# Patient Record
Sex: Male | Born: 1940 | Race: White | Hispanic: No | Marital: Married | Smoking: Former smoker
Health system: Southern US, Community
[De-identification: ages and names within clinical notes are randomized; demographics above are authoritative.]

## PROBLEM LIST (undated history)

## (undated) DIAGNOSIS — K5792 Diverticulitis of intestine, part unspecified, without perforation or abscess without bleeding: Secondary | ICD-10-CM

## (undated) DIAGNOSIS — R002 Palpitations: Secondary | ICD-10-CM

## (undated) DIAGNOSIS — Z87442 Personal history of urinary calculi: Secondary | ICD-10-CM

## (undated) DIAGNOSIS — D689 Coagulation defect, unspecified: Secondary | ICD-10-CM

## (undated) DIAGNOSIS — K5790 Diverticulosis of intestine, part unspecified, without perforation or abscess without bleeding: Secondary | ICD-10-CM

## (undated) DIAGNOSIS — R7989 Other specified abnormal findings of blood chemistry: Secondary | ICD-10-CM

## (undated) DIAGNOSIS — K219 Gastro-esophageal reflux disease without esophagitis: Secondary | ICD-10-CM

## (undated) DIAGNOSIS — N289 Disorder of kidney and ureter, unspecified: Secondary | ICD-10-CM

## (undated) DIAGNOSIS — I272 Pulmonary hypertension, unspecified: Secondary | ICD-10-CM

## (undated) DIAGNOSIS — I472 Ventricular tachycardia: Secondary | ICD-10-CM

## (undated) DIAGNOSIS — K635 Polyp of colon: Secondary | ICD-10-CM

## (undated) DIAGNOSIS — G4733 Obstructive sleep apnea (adult) (pediatric): Secondary | ICD-10-CM

## (undated) DIAGNOSIS — M1990 Unspecified osteoarthritis, unspecified site: Secondary | ICD-10-CM

## (undated) DIAGNOSIS — G4730 Sleep apnea, unspecified: Secondary | ICD-10-CM

## (undated) DIAGNOSIS — I480 Paroxysmal atrial fibrillation: Secondary | ICD-10-CM

## (undated) DIAGNOSIS — E785 Hyperlipidemia, unspecified: Secondary | ICD-10-CM

## (undated) DIAGNOSIS — R740 Nonspecific elevation of levels of transaminase and lactic acid dehydrogenase [LDH]: Secondary | ICD-10-CM

## (undated) DIAGNOSIS — I10 Essential (primary) hypertension: Secondary | ICD-10-CM

## (undated) DIAGNOSIS — E669 Obesity, unspecified: Secondary | ICD-10-CM

## (undated) DIAGNOSIS — D696 Thrombocytopenia, unspecified: Secondary | ICD-10-CM

## (undated) DIAGNOSIS — I499 Cardiac arrhythmia, unspecified: Secondary | ICD-10-CM

## (undated) DIAGNOSIS — E119 Type 2 diabetes mellitus without complications: Secondary | ICD-10-CM

## (undated) DIAGNOSIS — K760 Fatty (change of) liver, not elsewhere classified: Secondary | ICD-10-CM

## (undated) DIAGNOSIS — E210 Primary hyperparathyroidism: Secondary | ICD-10-CM

## (undated) DIAGNOSIS — H269 Unspecified cataract: Secondary | ICD-10-CM

## (undated) DIAGNOSIS — N200 Calculus of kidney: Secondary | ICD-10-CM

## (undated) DIAGNOSIS — Z0389 Encounter for observation for other suspected diseases and conditions ruled out: Principal | ICD-10-CM

## (undated) DIAGNOSIS — C4490 Unspecified malignant neoplasm of skin, unspecified: Secondary | ICD-10-CM

## (undated) DIAGNOSIS — R0602 Shortness of breath: Secondary | ICD-10-CM

## (undated) DIAGNOSIS — T7840XA Allergy, unspecified, initial encounter: Secondary | ICD-10-CM

## (undated) HISTORY — DX: Hyperlipidemia, unspecified: E78.5

## (undated) HISTORY — DX: Essential (primary) hypertension: I10

## (undated) HISTORY — DX: Gastro-esophageal reflux disease without esophagitis: K21.9

## (undated) HISTORY — DX: Ventricular tachycardia: I47.2

## (undated) HISTORY — PX: ROTATOR CUFF REPAIR: SHX139

## (undated) HISTORY — DX: Unspecified cataract: H26.9

## (undated) HISTORY — DX: Coagulation defect, unspecified: D68.9

## (undated) HISTORY — DX: Diverticulosis of intestine, part unspecified, without perforation or abscess without bleeding: K57.90

## (undated) HISTORY — DX: Thrombocytopenia, unspecified: D69.6

## (undated) HISTORY — DX: Unspecified malignant neoplasm of skin, unspecified: C44.90

## (undated) HISTORY — DX: Fatty (change of) liver, not elsewhere classified: K76.0

## (undated) HISTORY — DX: Other specified abnormal findings of blood chemistry: R79.89

## (undated) HISTORY — DX: Pulmonary hypertension, unspecified: I27.20

## (undated) HISTORY — DX: Disorder of kidney and ureter, unspecified: N28.9

## (undated) HISTORY — DX: Unspecified osteoarthritis, unspecified site: M19.90

## (undated) HISTORY — DX: Allergy, unspecified, initial encounter: T78.40XA

## (undated) HISTORY — DX: Sleep apnea, unspecified: G47.30

## (undated) HISTORY — DX: Type 2 diabetes mellitus without complications: E11.9

## (undated) HISTORY — DX: Palpitations: R00.2

## (undated) HISTORY — DX: Encounter for observation for other suspected diseases and conditions ruled out: Z03.89

## (undated) HISTORY — DX: Polyp of colon: K63.5

## (undated) HISTORY — DX: Paroxysmal atrial fibrillation: I48.0

## (undated) HISTORY — PX: CHOLECYSTECTOMY: SHX55

## (undated) HISTORY — DX: Shortness of breath: R06.02

## (undated) HISTORY — DX: Cardiac arrhythmia, unspecified: I49.9

## (undated) HISTORY — DX: Calculus of kidney: N20.0

## (undated) HISTORY — PX: EYE SURGERY: SHX253

## (undated) HISTORY — DX: Obesity, unspecified: E66.9

## (undated) HISTORY — DX: Nonspecific elevation of levels of transaminase and lactic acid dehydrogenase (ldh): R74.0

---

## 1998-06-06 ENCOUNTER — Encounter: Payer: Self-pay | Admitting: Internal Medicine

## 1998-06-06 LAB — CONVERTED CEMR LAB: PSA: 0.7 ng/mL

## 2003-03-07 ENCOUNTER — Ambulatory Visit (HOSPITAL_BASED_OUTPATIENT_CLINIC_OR_DEPARTMENT_OTHER): Admission: RE | Admit: 2003-03-07 | Discharge: 2003-03-07 | Payer: Self-pay | Admitting: Internal Medicine

## 2003-03-07 ENCOUNTER — Encounter: Payer: Self-pay | Admitting: Internal Medicine

## 2004-06-09 ENCOUNTER — Ambulatory Visit: Payer: Self-pay | Admitting: Internal Medicine

## 2004-06-16 ENCOUNTER — Ambulatory Visit: Payer: Self-pay | Admitting: Internal Medicine

## 2004-12-14 ENCOUNTER — Ambulatory Visit: Payer: Self-pay | Admitting: Internal Medicine

## 2004-12-21 ENCOUNTER — Ambulatory Visit: Payer: Self-pay | Admitting: Internal Medicine

## 2005-06-13 ENCOUNTER — Ambulatory Visit: Payer: Self-pay | Admitting: Internal Medicine

## 2005-06-22 ENCOUNTER — Ambulatory Visit: Payer: Self-pay | Admitting: Internal Medicine

## 2005-07-04 ENCOUNTER — Ambulatory Visit: Payer: Self-pay | Admitting: Internal Medicine

## 2005-07-04 ENCOUNTER — Ambulatory Visit: Payer: Self-pay | Admitting: Gastroenterology

## 2005-07-18 ENCOUNTER — Encounter: Payer: Self-pay | Admitting: Internal Medicine

## 2005-07-18 ENCOUNTER — Ambulatory Visit: Payer: Self-pay | Admitting: Gastroenterology

## 2005-07-18 ENCOUNTER — Encounter (INDEPENDENT_AMBULATORY_CARE_PROVIDER_SITE_OTHER): Payer: Self-pay | Admitting: *Deleted

## 2005-07-26 ENCOUNTER — Encounter: Payer: Self-pay | Admitting: Internal Medicine

## 2005-08-12 ENCOUNTER — Ambulatory Visit: Payer: Self-pay | Admitting: Internal Medicine

## 2005-10-28 ENCOUNTER — Ambulatory Visit: Payer: Self-pay | Admitting: Internal Medicine

## 2005-11-07 ENCOUNTER — Ambulatory Visit: Payer: Self-pay | Admitting: Internal Medicine

## 2006-03-09 ENCOUNTER — Ambulatory Visit: Payer: Self-pay | Admitting: Internal Medicine

## 2006-04-03 ENCOUNTER — Ambulatory Visit: Payer: Self-pay | Admitting: Internal Medicine

## 2006-07-31 ENCOUNTER — Ambulatory Visit: Payer: Self-pay | Admitting: Internal Medicine

## 2006-07-31 LAB — CONVERTED CEMR LAB
ALT: 52 units/L — ABNORMAL HIGH (ref 0–40)
AST: 29 units/L (ref 0–37)
Albumin: 4 g/dL (ref 3.5–5.2)
Alkaline Phosphatase: 40 units/L (ref 39–117)
BUN: 19 mg/dL (ref 6–23)
Bilirubin, Direct: 0.1 mg/dL (ref 0.0–0.3)
CO2: 34 meq/L — ABNORMAL HIGH (ref 19–32)
Calcium: 9.4 mg/dL (ref 8.4–10.5)
Chloride: 107 meq/L (ref 96–112)
Cholesterol: 170 mg/dL (ref 0–200)
Creatinine, Ser: 1 mg/dL (ref 0.4–1.5)
GFR calc Af Amer: 97 mL/min
GFR calc non Af Amer: 80 mL/min
Glucose, Bld: 115 mg/dL — ABNORMAL HIGH (ref 70–99)
HDL: 31.2 mg/dL — ABNORMAL LOW (ref >39.0)
Hgb A1c MFr Bld: 6.2 % — ABNORMAL HIGH (ref 4.6–6.0)
LDL Cholesterol: 99 mg/dL (ref 0–99)
Potassium: 4.5 meq/L (ref 3.5–5.1)
Sodium: 147 meq/L — ABNORMAL HIGH (ref 135–145)
Total Bilirubin: 1.1 mg/dL (ref 0.3–1.2)
Total CHOL/HDL Ratio: 5.4
Total Protein: 6.9 g/dL (ref 6.0–8.3)
Triglycerides: 197 mg/dL — ABNORMAL HIGH (ref 0–149)
VLDL: 39 mg/dL (ref 0–40)

## 2006-08-07 ENCOUNTER — Ambulatory Visit: Payer: Self-pay | Admitting: Internal Medicine

## 2006-09-18 ENCOUNTER — Ambulatory Visit: Payer: Self-pay | Admitting: Internal Medicine

## 2006-09-25 ENCOUNTER — Ambulatory Visit: Payer: Self-pay | Admitting: Internal Medicine

## 2006-09-28 ENCOUNTER — Ambulatory Visit: Payer: Self-pay | Admitting: Internal Medicine

## 2006-09-29 LAB — CONVERTED CEMR LAB
BUN: 23 mg/dL (ref 6–23)
Creatinine, Ser: 1.3 mg/dL (ref 0.4–1.5)

## 2006-10-04 ENCOUNTER — Encounter: Admission: RE | Admit: 2006-10-04 | Discharge: 2006-10-04 | Payer: Self-pay | Admitting: Internal Medicine

## 2006-12-05 ENCOUNTER — Ambulatory Visit: Payer: Self-pay | Admitting: Internal Medicine

## 2006-12-05 LAB — CONVERTED CEMR LAB
ALT: 57 units/L — ABNORMAL HIGH (ref 0–53)
AST: 31 units/L (ref 0–37)
Albumin: 3.9 g/dL (ref 3.5–5.2)
Alkaline Phosphatase: 38 units/L — ABNORMAL LOW (ref 39–117)
BUN: 24 mg/dL — ABNORMAL HIGH (ref 6–23)
Bilirubin, Direct: 0.1 mg/dL (ref 0.0–0.3)
CO2: 31 meq/L (ref 19–32)
Calcium: 8.9 mg/dL (ref 8.4–10.5)
Chloride: 112 meq/L (ref 96–112)
Cholesterol: 166 mg/dL (ref 0–200)
Creatinine, Ser: 1.1 mg/dL (ref 0.4–1.5)
Direct LDL: 107.1 mg/dL
GFR calc Af Amer: 87 mL/min
GFR calc non Af Amer: 72 mL/min
Glucose, Bld: 102 mg/dL — ABNORMAL HIGH (ref 70–99)
HDL: 27.8 mg/dL — ABNORMAL LOW (ref >39.0)
Hgb A1c MFr Bld: 6.5 % — ABNORMAL HIGH (ref 4.6–6.0)
Potassium: 4 meq/L (ref 3.5–5.1)
Sodium: 144 meq/L (ref 135–145)
Total Bilirubin: 0.9 mg/dL (ref 0.3–1.2)
Total CHOL/HDL Ratio: 6
Total Protein: 6.6 g/dL (ref 6.0–8.3)
Triglycerides: 242 mg/dL (ref 0–149)
VLDL: 48 mg/dL — ABNORMAL HIGH (ref 0–40)

## 2006-12-07 ENCOUNTER — Encounter: Payer: Self-pay | Admitting: Internal Medicine

## 2006-12-07 DIAGNOSIS — R7402 Elevation of levels of lactic acid dehydrogenase (LDH): Secondary | ICD-10-CM | POA: Insufficient documentation

## 2006-12-07 DIAGNOSIS — I10 Essential (primary) hypertension: Secondary | ICD-10-CM | POA: Insufficient documentation

## 2006-12-07 DIAGNOSIS — R7989 Other specified abnormal findings of blood chemistry: Secondary | ICD-10-CM

## 2006-12-07 DIAGNOSIS — E785 Hyperlipidemia, unspecified: Secondary | ICD-10-CM | POA: Insufficient documentation

## 2006-12-07 DIAGNOSIS — D696 Thrombocytopenia, unspecified: Secondary | ICD-10-CM

## 2006-12-07 DIAGNOSIS — K219 Gastro-esophageal reflux disease without esophagitis: Secondary | ICD-10-CM | POA: Insufficient documentation

## 2006-12-07 DIAGNOSIS — R7401 Elevation of levels of liver transaminase levels: Secondary | ICD-10-CM | POA: Insufficient documentation

## 2006-12-07 DIAGNOSIS — R740 Nonspecific elevation of levels of transaminase and lactic acid dehydrogenase [LDH]: Secondary | ICD-10-CM

## 2006-12-07 HISTORY — DX: Hyperlipidemia, unspecified: E78.5

## 2006-12-07 HISTORY — DX: Essential (primary) hypertension: I10

## 2006-12-07 HISTORY — DX: Gastro-esophageal reflux disease without esophagitis: K21.9

## 2006-12-07 HISTORY — DX: Thrombocytopenia, unspecified: D69.6

## 2006-12-07 HISTORY — DX: Elevation of levels of liver transaminase levels: R74.01

## 2006-12-07 HISTORY — DX: Elevation of levels of lactic acid dehydrogenase (LDH): R74.02

## 2006-12-07 HISTORY — DX: Other specified abnormal findings of blood chemistry: R79.89

## 2006-12-12 ENCOUNTER — Ambulatory Visit: Payer: Self-pay | Admitting: Internal Medicine

## 2006-12-27 ENCOUNTER — Ambulatory Visit: Payer: Self-pay

## 2007-01-05 ENCOUNTER — Telehealth (INDEPENDENT_AMBULATORY_CARE_PROVIDER_SITE_OTHER): Payer: Self-pay | Admitting: *Deleted

## 2007-03-27 ENCOUNTER — Ambulatory Visit: Payer: Self-pay | Admitting: Internal Medicine

## 2007-03-27 DIAGNOSIS — I472 Ventricular tachycardia, unspecified: Secondary | ICD-10-CM

## 2007-03-27 DIAGNOSIS — I4729 Other ventricular tachycardia: Secondary | ICD-10-CM

## 2007-03-27 HISTORY — DX: Ventricular tachycardia, unspecified: I47.20

## 2007-03-27 HISTORY — DX: Other ventricular tachycardia: I47.29

## 2007-03-27 HISTORY — DX: Ventricular tachycardia: I47.2

## 2007-04-02 ENCOUNTER — Ambulatory Visit: Payer: Self-pay | Admitting: Cardiology

## 2007-04-03 ENCOUNTER — Ambulatory Visit: Payer: Self-pay | Admitting: Internal Medicine

## 2007-04-03 ENCOUNTER — Ambulatory Visit: Payer: Self-pay | Admitting: Cardiology

## 2007-04-03 LAB — CONVERTED CEMR LAB
ALT: 58 units/L — ABNORMAL HIGH (ref 0–53)
AST: 32 units/L (ref 0–37)
Albumin: 4.1 g/dL (ref 3.5–5.2)
Alkaline Phosphatase: 46 units/L (ref 39–117)
BUN: 20 mg/dL (ref 6–23)
Basophils Absolute: 0 10*3/uL (ref 0.0–0.1)
Basophils Relative: 0.4 % (ref 0.0–1.0)
Bilirubin Urine: NEGATIVE
Bilirubin, Direct: 0.2 mg/dL (ref 0.0–0.3)
CO2: 34 meq/L — ABNORMAL HIGH (ref 19–32)
Calcium: 9.9 mg/dL (ref 8.4–10.5)
Chloride: 110 meq/L (ref 96–112)
Cholesterol: 162 mg/dL (ref 0–200)
Creatinine, Ser: 1 mg/dL (ref 0.4–1.5)
Eosinophils Absolute: 0.1 10*3/uL (ref 0.0–0.6)
Eosinophils Relative: 1.3 % (ref 0.0–5.0)
GFR calc Af Amer: 97 mL/min
GFR calc non Af Amer: 80 mL/min
Glucose, Bld: 122 mg/dL — ABNORMAL HIGH (ref 70–99)
HCT: 42.7 % (ref 39.0–52.0)
HDL: 28 mg/dL — ABNORMAL LOW (ref >39.0)
Hemoglobin, Urine: NEGATIVE
Hemoglobin: 14.6 g/dL (ref 13.0–17.0)
INR: 1 (ref 0.8–1.0)
Ketones, ur: NEGATIVE mg/dL
LDL Cholesterol: 96 mg/dL (ref 0–99)
Leukocytes, UA: NEGATIVE
Lymphocytes Relative: 22.6 % (ref 12.0–46.0)
MCHC: 34.3 g/dL (ref 30.0–36.0)
MCV: 91.7 fL (ref 78.0–100.0)
Monocytes Absolute: 0.5 10*3/uL (ref 0.2–0.7)
Monocytes Relative: 9.3 % (ref 3.0–11.0)
Neutro Abs: 3.2 10*3/uL (ref 1.4–7.7)
Neutrophils Relative %: 66.4 % (ref 43.0–77.0)
Nitrite: NEGATIVE
PSA: 1.04 ng/mL (ref 0.10–4.00)
Platelets: 128 10*3/uL — ABNORMAL LOW (ref 150–400)
Potassium: 4.9 meq/L (ref 3.5–5.1)
Prothrombin Time: 11.8 s (ref 10.9–13.3)
RBC: 4.65 M/uL (ref 4.22–5.81)
RDW: 12.3 % (ref 11.5–14.6)
Sodium: 149 meq/L — ABNORMAL HIGH (ref 135–145)
Specific Gravity, Urine: 1.025 (ref 1.000–1.03)
TSH: 1.75 microintl units/mL (ref 0.35–5.50)
Total Bilirubin: 1.1 mg/dL (ref 0.3–1.2)
Total CHOL/HDL Ratio: 5.8
Total Protein, Urine: NEGATIVE mg/dL
Total Protein: 7 g/dL (ref 6.0–8.3)
Triglycerides: 190 mg/dL — ABNORMAL HIGH (ref 0–149)
Urine Glucose: NEGATIVE mg/dL
Urobilinogen, UA: 0.2 (ref 0.0–1.0)
VLDL: 38 mg/dL (ref 0–40)
WBC: 4.9 10*3/uL (ref 4.5–10.5)
aPTT: 26.3 s (ref 21.7–29.8)
pH: 6 (ref 5.0–8.0)

## 2007-04-06 ENCOUNTER — Ambulatory Visit: Payer: Self-pay | Admitting: Cardiology

## 2007-04-06 ENCOUNTER — Inpatient Hospital Stay (HOSPITAL_BASED_OUTPATIENT_CLINIC_OR_DEPARTMENT_OTHER): Admission: RE | Admit: 2007-04-06 | Discharge: 2007-04-06 | Payer: Self-pay | Admitting: Cardiology

## 2007-04-06 DIAGNOSIS — IMO0001 Reserved for inherently not codable concepts without codable children: Secondary | ICD-10-CM | POA: Insufficient documentation

## 2007-04-06 HISTORY — PX: CARDIAC CATHETERIZATION: SHX172

## 2007-04-06 HISTORY — DX: Reserved for inherently not codable concepts without codable children: IMO0001

## 2007-04-24 ENCOUNTER — Ambulatory Visit: Payer: Self-pay | Admitting: Cardiology

## 2007-04-27 ENCOUNTER — Ambulatory Visit: Payer: Self-pay | Admitting: Internal Medicine

## 2007-07-24 ENCOUNTER — Telehealth: Payer: Self-pay | Admitting: Internal Medicine

## 2007-07-26 ENCOUNTER — Ambulatory Visit: Payer: Self-pay | Admitting: Cardiology

## 2007-09-25 ENCOUNTER — Telehealth: Payer: Self-pay | Admitting: Internal Medicine

## 2007-10-22 ENCOUNTER — Encounter: Payer: Self-pay | Admitting: Internal Medicine

## 2007-10-22 DIAGNOSIS — G4733 Obstructive sleep apnea (adult) (pediatric): Secondary | ICD-10-CM

## 2007-10-22 DIAGNOSIS — G4730 Sleep apnea, unspecified: Secondary | ICD-10-CM | POA: Insufficient documentation

## 2007-10-22 HISTORY — DX: Obstructive sleep apnea (adult) (pediatric): G47.33

## 2007-10-26 ENCOUNTER — Ambulatory Visit: Payer: Self-pay | Admitting: Internal Medicine

## 2007-10-30 ENCOUNTER — Telehealth (INDEPENDENT_AMBULATORY_CARE_PROVIDER_SITE_OTHER): Payer: Self-pay | Admitting: *Deleted

## 2008-01-01 ENCOUNTER — Encounter: Payer: Self-pay | Admitting: Internal Medicine

## 2008-01-11 ENCOUNTER — Ambulatory Visit: Payer: Self-pay | Admitting: Internal Medicine

## 2008-04-09 ENCOUNTER — Encounter: Payer: Self-pay | Admitting: Internal Medicine

## 2008-06-16 ENCOUNTER — Ambulatory Visit: Payer: Self-pay | Admitting: Internal Medicine

## 2008-06-18 ENCOUNTER — Telehealth: Payer: Self-pay | Admitting: Internal Medicine

## 2008-06-18 LAB — CONVERTED CEMR LAB
ALT: 75 units/L — ABNORMAL HIGH (ref 0–53)
AST: 43 units/L — ABNORMAL HIGH (ref 0–37)
Albumin: 4.1 g/dL (ref 3.5–5.2)
Alkaline Phosphatase: 43 units/L (ref 39–117)
BUN: 22 mg/dL (ref 6–23)
Basophils Absolute: 0 10*3/uL (ref 0.0–0.1)
Basophils Relative: 0.3 % (ref 0.0–3.0)
Bilirubin, Direct: 0.1 mg/dL (ref 0.0–0.3)
CO2: 30 meq/L (ref 19–32)
Calcium: 9.7 mg/dL (ref 8.4–10.5)
Chloride: 102 meq/L (ref 96–112)
Cholesterol: 151 mg/dL (ref 0–200)
Creatinine, Ser: 1.1 mg/dL (ref 0.4–1.5)
Eosinophils Absolute: 0.1 10*3/uL (ref 0.0–0.7)
Eosinophils Relative: 1.2 % (ref 0.0–5.0)
GFR calc Af Amer: 86 mL/min
GFR calc non Af Amer: 71 mL/min
Glucose, Bld: 127 mg/dL — ABNORMAL HIGH (ref 70–99)
HCT: 41.2 % (ref 39.0–52.0)
HDL: 24.5 mg/dL — ABNORMAL LOW (ref >39.0)
Hemoglobin: 14 g/dL (ref 13.0–17.0)
Hgb A1c MFr Bld: 6.7 % — ABNORMAL HIGH (ref 4.6–6.0)
LDL Cholesterol: 93 mg/dL (ref 0–99)
Lymphocytes Relative: 18.5 % (ref 12.0–46.0)
MCHC: 34.1 g/dL (ref 30.0–36.0)
MCV: 91.7 fL (ref 78.0–100.0)
Monocytes Absolute: 0.5 10*3/uL (ref 0.1–1.0)
Monocytes Relative: 9 % (ref 3.0–12.0)
Neutro Abs: 4.1 10*3/uL (ref 1.4–7.7)
Neutrophils Relative %: 71 % (ref 43.0–77.0)
PSA: 4.02 ng/mL — ABNORMAL HIGH (ref 0.10–4.00)
Platelets: 128 10*3/uL — ABNORMAL LOW (ref 150–400)
Potassium: 4.6 meq/L (ref 3.5–5.1)
RBC: 4.49 M/uL (ref 4.22–5.81)
RDW: 12.8 % (ref 11.5–14.6)
Sodium: 140 meq/L (ref 135–145)
TSH: 1.58 microintl units/mL (ref 0.35–5.50)
Total Bilirubin: 0.9 mg/dL (ref 0.3–1.2)
Total CHOL/HDL Ratio: 6.2
Total Protein: 7.3 g/dL (ref 6.0–8.3)
Triglycerides: 169 mg/dL — ABNORMAL HIGH (ref 0–149)
VLDL: 34 mg/dL (ref 0–40)
WBC: 5.8 10*3/uL (ref 4.5–10.5)

## 2008-06-20 ENCOUNTER — Encounter: Payer: Self-pay | Admitting: Internal Medicine

## 2008-06-24 ENCOUNTER — Encounter: Payer: Self-pay | Admitting: Internal Medicine

## 2008-07-03 ENCOUNTER — Ambulatory Visit: Payer: Self-pay | Admitting: Internal Medicine

## 2008-07-04 ENCOUNTER — Encounter: Payer: Self-pay | Admitting: Internal Medicine

## 2008-07-04 LAB — CONVERTED CEMR LAB
PSA, Free Pct: 25 (ref >25)
PSA, Free: 0.4 ng/mL
PSA: 1.59 ng/mL (ref 0.10–4.00)

## 2008-08-19 ENCOUNTER — Encounter: Payer: Self-pay | Admitting: Internal Medicine

## 2008-08-27 ENCOUNTER — Encounter: Payer: Self-pay | Admitting: Internal Medicine

## 2008-09-01 ENCOUNTER — Encounter: Admission: RE | Admit: 2008-09-01 | Discharge: 2008-09-01 | Payer: Self-pay | Admitting: Gastroenterology

## 2008-09-04 ENCOUNTER — Encounter: Payer: Self-pay | Admitting: Internal Medicine

## 2008-09-04 LAB — HM COLONOSCOPY

## 2008-09-23 ENCOUNTER — Encounter: Admission: RE | Admit: 2008-09-23 | Discharge: 2008-09-23 | Payer: Self-pay | Admitting: Gastroenterology

## 2008-09-23 ENCOUNTER — Encounter: Payer: Self-pay | Admitting: Internal Medicine

## 2008-09-23 ENCOUNTER — Inpatient Hospital Stay (HOSPITAL_COMMUNITY): Admission: EM | Admit: 2008-09-23 | Discharge: 2008-09-26 | Payer: Self-pay | Admitting: Emergency Medicine

## 2008-09-23 ENCOUNTER — Encounter: Payer: Self-pay | Admitting: Gastroenterology

## 2008-09-24 ENCOUNTER — Encounter (INDEPENDENT_AMBULATORY_CARE_PROVIDER_SITE_OTHER): Payer: Self-pay | Admitting: Surgery

## 2008-10-01 ENCOUNTER — Ambulatory Visit: Payer: Self-pay | Admitting: Internal Medicine

## 2008-11-11 ENCOUNTER — Encounter: Payer: Self-pay | Admitting: Internal Medicine

## 2009-07-06 ENCOUNTER — Ambulatory Visit: Payer: Self-pay | Admitting: Internal Medicine

## 2009-07-06 LAB — CONVERTED CEMR LAB

## 2009-07-07 LAB — CONVERTED CEMR LAB
ALT: 49 units/L (ref 0–53)
AST: 32 units/L (ref 0–37)
Albumin: 3.9 g/dL (ref 3.5–5.2)
Alkaline Phosphatase: 36 units/L — ABNORMAL LOW (ref 39–117)
BUN: 18 mg/dL (ref 6–23)
Basophils Absolute: 0 10*3/uL (ref 0.0–0.1)
Basophils Relative: 0.6 % (ref 0.0–3.0)
Bilirubin, Direct: 0.1 mg/dL (ref 0.0–0.3)
CO2: 30 meq/L (ref 19–32)
Calcium: 9.1 mg/dL (ref 8.4–10.5)
Chloride: 105 meq/L (ref 96–112)
Cholesterol: 144 mg/dL (ref 0–200)
Creatinine, Ser: 1.2 mg/dL (ref 0.4–1.5)
Direct LDL: 90.4 mg/dL
Eosinophils Absolute: 0.1 10*3/uL (ref 0.0–0.7)
Eosinophils Relative: 1.8 % (ref 0.0–5.0)
GFR calc non Af Amer: 64.23 mL/min (ref >60)
Glucose, Bld: 116 mg/dL — ABNORMAL HIGH (ref 70–99)
HCT: 40.2 % (ref 39.0–52.0)
HDL: 28.5 mg/dL — ABNORMAL LOW (ref >39.00)
Hemoglobin: 13 g/dL (ref 13.0–17.0)
Hgb A1c MFr Bld: 6.5 % (ref 4.6–6.5)
Lymphocytes Relative: 19 % (ref 12.0–46.0)
Lymphs Abs: 0.9 10*3/uL (ref 0.7–4.0)
MCHC: 32.4 g/dL (ref 30.0–36.0)
MCV: 93.1 fL (ref 78.0–100.0)
Monocytes Absolute: 0.4 10*3/uL (ref 0.1–1.0)
Monocytes Relative: 8.3 % (ref 3.0–12.0)
Neutro Abs: 3.2 10*3/uL (ref 1.4–7.7)
Neutrophils Relative %: 70.3 % (ref 43.0–77.0)
PSA: 1.19 ng/mL (ref 0.10–4.00)
Platelets: 113 10*3/uL — ABNORMAL LOW (ref 150.0–400.0)
Potassium: 4.4 meq/L (ref 3.5–5.1)
RBC: 4.31 M/uL (ref 4.22–5.81)
RDW: 12.9 % (ref 11.5–14.6)
Sodium: 140 meq/L (ref 135–145)
Total Bilirubin: 0.7 mg/dL (ref 0.3–1.2)
Total CHOL/HDL Ratio: 5
Total Protein: 7.1 g/dL (ref 6.0–8.3)
Triglycerides: 207 mg/dL — ABNORMAL HIGH (ref 0.0–149.0)
VLDL: 41.4 mg/dL — ABNORMAL HIGH (ref 0.0–40.0)
WBC: 4.6 10*3/uL (ref 4.5–10.5)

## 2009-07-10 ENCOUNTER — Encounter: Payer: Self-pay | Admitting: Internal Medicine

## 2009-11-05 ENCOUNTER — Ambulatory Visit: Payer: Self-pay | Admitting: Internal Medicine

## 2009-11-05 LAB — CONVERTED CEMR LAB
ALT: 27 units/L (ref 0–53)
AST: 23 units/L (ref 0–37)
Albumin: 4.4 g/dL (ref 3.5–5.2)
Alkaline Phosphatase: 36 units/L — ABNORMAL LOW (ref 39–117)
BUN: 42 mg/dL — ABNORMAL HIGH (ref 6–23)
Bilirubin, Direct: 0.1 mg/dL (ref 0.0–0.3)
CO2: 30 meq/L (ref 19–32)
Calcium: 9.2 mg/dL (ref 8.4–10.5)
Chloride: 107 meq/L (ref 96–112)
Cholesterol: 156 mg/dL (ref 0–200)
Creatinine, Ser: 1.5 mg/dL (ref 0.4–1.5)
GFR calc non Af Amer: 49.6 mL/min (ref >60)
Glucose, Bld: 109 mg/dL — ABNORMAL HIGH (ref 70–99)
HDL: 32.8 mg/dL — ABNORMAL LOW (ref >39.00)
Hgb A1c MFr Bld: 6.2 % (ref 4.6–6.5)
LDL Cholesterol: 92 mg/dL (ref 0–99)
Potassium: 4.6 meq/L (ref 3.5–5.1)
Sodium: 144 meq/L (ref 135–145)
Total Bilirubin: 1.1 mg/dL (ref 0.3–1.2)
Total CHOL/HDL Ratio: 5
Total Protein: 7.3 g/dL (ref 6.0–8.3)
Triglycerides: 155 mg/dL — ABNORMAL HIGH (ref 0.0–149.0)
VLDL: 31 mg/dL (ref 0.0–40.0)

## 2009-11-10 ENCOUNTER — Ambulatory Visit: Payer: Self-pay | Admitting: Internal Medicine

## 2009-11-10 DIAGNOSIS — E669 Obesity, unspecified: Secondary | ICD-10-CM

## 2009-11-10 HISTORY — DX: Obesity, unspecified: E66.9

## 2009-12-22 ENCOUNTER — Telehealth (INDEPENDENT_AMBULATORY_CARE_PROVIDER_SITE_OTHER): Payer: Self-pay | Admitting: *Deleted

## 2010-02-02 ENCOUNTER — Ambulatory Visit: Payer: Self-pay | Admitting: Internal Medicine

## 2010-02-02 LAB — CONVERTED CEMR LAB
ALT: 30 units/L (ref 0–53)
AST: 19 units/L (ref 0–37)
Albumin: 4.1 g/dL (ref 3.5–5.2)
Alkaline Phosphatase: 35 units/L — ABNORMAL LOW (ref 39–117)
BUN: 37 mg/dL — ABNORMAL HIGH (ref 6–23)
Bilirubin, Direct: 0.1 mg/dL (ref 0.0–0.3)
CO2: 27 meq/L (ref 19–32)
Calcium: 9.5 mg/dL (ref 8.4–10.5)
Chloride: 105 meq/L (ref 96–112)
Cholesterol: 164 mg/dL (ref 0–200)
Creatinine, Ser: 1.6 mg/dL — ABNORMAL HIGH (ref 0.4–1.5)
GFR calc non Af Amer: 46 mL/min (ref >60)
Glucose, Bld: 106 mg/dL — ABNORMAL HIGH (ref 70–99)
HDL: 34.3 mg/dL — ABNORMAL LOW (ref >39.00)
Hgb A1c MFr Bld: 6.2 % (ref 4.6–6.5)
LDL Cholesterol: 91 mg/dL (ref 0–99)
Potassium: 5.1 meq/L (ref 3.5–5.1)
Sodium: 141 meq/L (ref 135–145)
Total Bilirubin: 0.7 mg/dL (ref 0.3–1.2)
Total CHOL/HDL Ratio: 5
Total Protein: 6.5 g/dL (ref 6.0–8.3)
Triglycerides: 195 mg/dL — ABNORMAL HIGH (ref 0.0–149.0)
VLDL: 39 mg/dL (ref 0.0–40.0)

## 2010-02-05 ENCOUNTER — Ambulatory Visit: Payer: Self-pay | Admitting: Internal Medicine

## 2010-06-10 ENCOUNTER — Ambulatory Visit
Admission: RE | Admit: 2010-06-10 | Discharge: 2010-06-10 | Payer: Self-pay | Source: Home / Self Care | Attending: Internal Medicine | Admitting: Internal Medicine

## 2010-06-10 DIAGNOSIS — J069 Acute upper respiratory infection, unspecified: Secondary | ICD-10-CM | POA: Insufficient documentation

## 2010-06-17 ENCOUNTER — Encounter: Payer: Self-pay | Admitting: Internal Medicine

## 2010-07-08 NOTE — Assessment & Plan Note (Signed)
Summary: 4 month rov/et/pt rsc from bmp/cjr   Vital Signs:  Patient profile:   70 year old male Weight:      235 pounds BMI:     33.36 Pulse rate:   56 / minute Pulse rhythm:   regular Resp:     12 per minute BP sitting:   124 / 76  (left arm) Cuff size:   regular  Vitals Entered By: Rica Records, RN (November 10, 2009 1:24 PM) CC: 4 month rov, labs review Is Patient Diabetic? No   CC:  4 month rov and labs review.  History of Present Illness:  Follow-Up Visit      This is a 70 year old man who presents for Follow-up visit.  The patient denies chest pain and palpitations.  Since the last visit the patient notes no new problems or concerns.  The patient reports taking meds as prescribed, dietary compliance, and exercising occasionaly.  When questioned about possible medication side effects, the patient notes none.    All other systems reviewed and were negative   Preventive Screening-Counseling & Management  Alcohol-Tobacco     Smoking Status: never  Current Medications (verified): 1)  Aspirin Ec 325 Mg Tbec (Aspirin) .... Once Daily 2)  Lisinopril-Hydrochlorothiazide 20-12.5 Mg Tabs (Lisinopril-Hydrochlorothiazide) .... Take 2 Tablet By Mouth Once A Day 3)  Prilosec Otc 20 Mg Tbec (Omeprazole Magnesium) .... One By Mouth Day 4)  Viagra 100 Mg Tabs (Sildenafil Citrate) .... 1/2-1 By Mouth Once Daily As Needed. 5)  Fish Oil 1000 Mg  Caps (Omega-3 Fatty Acids) .... Once Daily 6)  Multivitamins   Tabs (Multiple Vitamin) .... Once Daily 7)  Vitamin D 1000 Unit Caps (Cholecalciferol) .... Once Daily 8)  Carvedilol 25 Mg Tabs (Carvedilol) .... One Bid 9)  Niaspan 500 Mg Cr-Tabs (Niacin (Antihyperlipidemic)) .... Take 1 Tablet By Mouth Once A Day  Allergies (verified): No Known Drug Allergies  Past History:  Past Medical History: Last updated: 12/07/2006 GERD Hyperlipidemia Hypertension thrombocytopenia increased LFT's 2002 Hep B & C negative increased glucose  Past  Surgical History: Last updated: 10/01/2008  Cholecystectomy  Family History: Last updated: 07/06/2009 father--kidney failure 11 yo mother CHF age 86 yo Family History Diabetes 1st degree relative---mother and father  Social History: Last updated: 03/27/2007 Married Regular exercise-no  Risk Factors: Exercise: no (03/27/2007)  Risk Factors: Smoking Status: never (11/10/2009)  Physical Exam  General:  Well-developed,well-nourished,in no acute distress; alert,appropriate and cooperative throughout examination Head:  normocephalic and atraumatic.   Eyes:  pupils equal and pupils round.   Ears:  R ear normal and L ear normal.   Neck:  No deformities, masses, or tenderness noted. Chest Wall:  no deformities and no tenderness.   Lungs:  Normal respiratory effort, chest expands symmetrically. Lungs are clear to auscultation, no crackles or wheezes. Heart:  Normal rate and regular rhythm. S1 and S2 normal without gallop, murmur, click, rub or other extra sounds. Abdomen:  Bowel sounds positive,abdomen soft and non-tender without masses, organomegaly or hernias noted.  overweight Msk:  No deformity or scoliosis noted of thoracic or lumbar spine.   Neurologic:  cranial nerves II-XII intact and gait normal.     Impression & Recommendations:  Problem # 1:  HYPERGLYCEMIA (ICD-790.6) improved weight loss helping  Problem # 2:  HYPERLIPIDEMIA (ICD-272.4) hdl improved His updated medication list for this problem includes:    Niaspan 500 Mg Cr-tabs (Niacin (antihyperlipidemic)) .Marland Kitchen... Take 1 tablet by mouth once a day  Problem #  3:  HYPERTENSION (ICD-401.9) controlled continue current medications  His updated medication list for this problem includes:    Lisinopril-hydrochlorothiazide 20-12.5 Mg Tabs (Lisinopril-hydrochlorothiazide) .Marland Kitchen... Take 2 tablet by mouth once a day    Carvedilol 25 Mg Tabs (Carvedilol) ..... One bid  BP today: 124/76 Prior BP: 134/78 (07/06/2009)  Labs  Reviewed: K+: 4.6 (11/05/2009) Creat: : 1.5 (11/05/2009)   Chol: 156 (11/05/2009)   HDL: 32.80 (11/05/2009)   LDL: 92 (11/05/2009)   TG: 155.0 (11/05/2009)  Problem # 4:  OBESITY (ICD-278.00)  he has lost weight: congratulated encouraged to continue low calorie diet needs to add exercise all of the above discussed with patient and wife  Ht: 70.5 (07/06/2009)   Wt: 235 (11/10/2009)   BMI: 33.36 (11/10/2009)  Complete Medication List: 1)  Aspirin Ec 325 Mg Tbec (Aspirin) .... Once daily 2)  Lisinopril-hydrochlorothiazide 20-12.5 Mg Tabs (Lisinopril-hydrochlorothiazide) .... Take 2 tablet by mouth once a day 3)  Prilosec Otc 20 Mg Tbec (Omeprazole magnesium) .... One by mouth day 4)  Viagra 100 Mg Tabs (Sildenafil citrate) .... 1/2-1 by mouth once daily as needed. 5)  Fish Oil 1000 Mg Caps (Omega-3 fatty acids) .... Once daily 6)  Multivitamins Tabs (Multiple vitamin) .... Once daily 7)  Vitamin D 1000 Unit Caps (Cholecalciferol) .... Once daily 8)  Carvedilol 25 Mg Tabs (Carvedilol) .... One bid 9)  Niaspan 500 Mg Cr-tabs (Niacin (antihyperlipidemic)) .... Take 1 tablet by mouth once a day  Patient Instructions: 1)  Please schedule a follow-up appointment in 3 months. 2)  labs one week prior to visit 3)  lipids---272.4 4)  lfts-995.2 5)  bmet-995.2 6)  A1C-250.02 7)

## 2010-07-08 NOTE — Progress Notes (Signed)
Summary: increase Metoprolol  Phone Note Call from Patient   Caller: Patient Call For: Dr. Leanne Chang Summary of Call: Martin Cordova would like to increase his Metoprolol due to heart pounding and palpitations. Initial call taken by: Deanna Artis CMA,  July 24, 2007 10:02 AM  Follow-up for Phone Call        he probably needs to see me, but will increase meds. See Rx Follow-up by: Phoebe Sharps MD,  July 24, 2007 10:07 AM  Additional Follow-up for Phone Call Additional follow up Details #1::        Pt. is only taking 25 mg. q day....Marland KitchenMarland KitchenDo you want him increase to 100 mg. 1/2 two times a day? [id number] Additional Follow-up by: Deanna Artis CMA,  July 24, 2007 10:35 AM    Additional Follow-up for Phone Call Additional follow up Details #2::    yes Follow-up by: Phoebe Sharps MD,  July 24, 2007 12:07 PM  New/Updated Medications: METOPROLOL TARTRATE 100 MG TABS (METOPROLOL TARTRATE) 1/2 by mouth two times a day   Prescriptions: METOPROLOL TARTRATE 100 MG TABS (METOPROLOL TARTRATE) 1/2 by mouth two times a day  #90 x 3   Entered and Authorized by:   Phoebe Sharps MD   Signed by:   Phoebe Sharps MD on 07/24/2007   Method used:   [facility] to ...       [facility]  [address] [id number]       [address]       [place], [place]  [address]       Ph: [phone] or [phone]       Fax: [phone]   RxID:   [id number]    Pt. notified.

## 2010-07-08 NOTE — Assessment & Plan Note (Signed)
Summary: m6a/fup/RCD   Vital Signs:  Patient Profile:   70 Years Old Male Height:     70.5 inches Weight:      243 pounds Temp:     98.4 degrees F Pulse rate:   76 / minute BP sitting:   136 / 68  (left arm)  Vitals Entered By: Rica Records, RN (Oct 26, 2007 3:30 PM)                 Chief Complaint:  6 month rov.  History of Present Illness:  Follow-Up Visit      This is a 70 year old man who presents for Follow-up visit.  The patient denies chest pain, palpitations, dizziness, syncope, low blood sugar symptoms, high blood sugar symptoms, edema, SOB, DOE, PND, and orthopnea.  Since the last visit the patient notes no new problems or concerns.  The patient reports taking meds as prescribed.  When questioned about possible medication side effects, the patient notes none.    Past Medical History: GERD Hyperlipidemia Hypertension thrombocytopenia increased LFT's 2002 Hep B & C negative increased glucose  Past Surgical History: none as of 2000 Social History: Married Regular exercise-no  Family History:  Current Meds:  ASPIR-81 81 MG TBEC (ASPIRIN) Take 1 tablet by mouth once a day LISINOPRIL-HYDROCHLOROTHIAZIDE 20-12.5 MG TABS (LISINOPRIL-HYDROCHLOROTHIAZIDE) Take 2 tablet by mouth once a day PRILOSEC OTC 20 MG TBEC (OMEPRAZOLE MAGNESIUM) one by mouth day METOPROLOL SUCCINATE 25 MG  TB24 (METOPROLOL SUCCINATE) take 1/2 two times a day and an additional 1/2 if needed VIAGRA 100 MG TABS (SILDENAFIL CITRATE) 1/2-1 by mouth once daily as needed. FISH OIL 1000 MG  CAPS (OMEGA-3 FATTY ACIDS) once daily      Current Allergies (reviewed today): No known allergies      Review of Systems       no other complaints in a complete ROS    Physical Exam  General:     Well-developed,well-nourished,in no acute distress; alert,appropriate and cooperative throughout examination Head:     normocephalic and atraumatic.   Eyes:     pupils equal and pupils round.   Ears:  R ear normal and L ear normal.   Neck:     No deformities, masses, or tenderness noted. Chest Wall:     no deformities and no tenderness.   Lungs:     Normal respiratory effort, chest expands symmetrically. Lungs are clear to auscultation, no crackles or wheezes. Heart:     Normal rate and regular rhythm. S1 and S2 normal without gallop, murmur, click, rub or other extra sounds. Abdomen:     Bowel sounds positive,abdomen soft and non-tender without masses, organomegaly or hernias noted.  overweight Msk:     No deformity or scoliosis noted of thoracic or lumbar spine.   Pulses:     R radial normal and L radial normal.   Extremities:     No clubbing, cyanosis, edema, or deformity noted  Neurologic:     cranial nerves II-XII intact and gait normal.      Impression & Recommendations:  Problem # 1:  HYPERTENSION (ICD-401.9) tolerating meds would be much better with weight loss---he understands ("it's just hard") His updated medication list for this problem includes:    Lisinopril-hydrochlorothiazide 20-12.5 Mg Tabs (Lisinopril-hydrochlorothiazide) .Marland Kitchen... Take 2 tablet by mouth once a day    Metoprolol Succinate 25 Mg Tb24 (Metoprolol succinate) .Marland Kitchen... Take 1/2 two times a day and an additional 1/2 if needed  BP today: 136/68 Prior  BP: 144/86 (04/27/2007)  Labs Reviewed: Creat: 1.0 (04/03/2007) Chol: 162 (04/03/2007)   HDL: 28.0 (04/03/2007)   LDL: 96 (04/03/2007)   TG: 190 (04/03/2007)   Problem # 2:  GERD (ICD-530.81) no sxs on meds His updated medication list for this problem includes:    Prilosec Otc 20 Mg Tbec (Omeprazole magnesium) ..... One by mouth day   Problem # 3:  HYPERLIPIDEMIA (ICD-272.4) diet, exercise and weight loss are the key Labs Reviewed: Chol: 162 (04/03/2007)   HDL: 28.0 (04/03/2007)   LDL: 96 (04/03/2007)   TG: 190 (04/03/2007) SGOT: 32 (04/03/2007)   SGPT: 58 (04/03/2007)   Problem # 4:  THROMBOCYTOPENIA (ICD-287.5)  Complete Medication  List: 1)  Aspir-81 81 Mg Tbec (Aspirin) .... Take 1 tablet by mouth once a day 2)  Lisinopril-hydrochlorothiazide 20-12.5 Mg Tabs (Lisinopril-hydrochlorothiazide) .... Take 2 tablet by mouth once a day 3)  Prilosec Otc 20 Mg Tbec (Omeprazole magnesium) .... One by mouth day 4)  Metoprolol Succinate 25 Mg Tb24 (Metoprolol succinate) .... Take 1/2 two times a day and an additional 1/2 if needed 5)  Viagra 100 Mg Tabs (Sildenafil citrate) .... 1/2-1 by mouth once daily as needed. 6)  Fish Oil 1000 Mg Caps (Omega-3 fatty acids) .... Once daily   Patient Instructions: 1)  Please schedule a follow-up appointment in 6 months. CPX   ]

## 2010-07-08 NOTE — Letter (Signed)
Summary: [facility]   Imported By: [name] 07/10/2008 14:12:11  _____________________________________________________________________  External Attachment:    Type:   Image     Comment:   External Document

## 2010-07-08 NOTE — Assessment & Plan Note (Signed)
Summary: congestion//ccm   Vital Signs:  Patient profile:   70 year old male Weight:      245 pounds Temp:     98.3 degrees F oral BP sitting:   124 / 72  (left arm) Cuff size:   large  Vitals Entered By: Townsend Roger, CMA (June 10, 2010 9:23 AM) CC: hoarse, stuffy, was coughing x3-4 days   CC:  hoarse, stuffy, and was coughing x3-4 days.  History of Present Illness: scheduled for orthopedic surgery---has developed URI sxs--ongoing for 7 days.  no fever or chills  Current Medications (verified): 1)  Aspirin Ec 325 Mg Tbec (Aspirin) .... Once Daily 2)  Lisinopril-Hydrochlorothiazide 20-12.5 Mg Tabs (Lisinopril-Hydrochlorothiazide) .... Take 2 Tablet By Mouth Once A Day 3)  Prilosec Otc 20 Mg Tbec (Omeprazole Magnesium) .... One By Mouth Day 4)  Viagra 100 Mg Tabs (Sildenafil Citrate) .... 1/2-1 By Mouth Once Daily As Needed. 5)  Fish Oil 1000 Mg  Caps (Omega-3 Fatty Acids) .... Once Daily 6)  Multivitamins   Tabs (Multiple Vitamin) .... Once Daily 7)  Vitamin D 1000 Unit Caps (Cholecalciferol) .... Once Daily 8)  Carvedilol 25 Mg Tabs (Carvedilol) .... One Bid 9)  Niaspan 500 Mg Cr-Tabs (Niacin (Antihyperlipidemic)) .... Take 1 Tablet By Mouth Once A Day  Allergies (verified): No Known Drug Allergies  Past History:  Past Medical History: Last updated: 12/07/2006 GERD Hyperlipidemia Hypertension thrombocytopenia increased LFT's 2002 Hep B & C negative increased glucose  Past Surgical History: Last updated: 10/01/2008  Cholecystectomy  Family History: Last updated: 07/06/2009 father--kidney failure 20 yo mother CHF age 38 yo Family History Diabetes 1st degree relative---mother and father  Social History: Last updated: 03/27/2007 Married Regular exercise-no  Risk Factors: Exercise: no (03/27/2007)  Risk Factors: Smoking Status: never (11/10/2009)  Physical Exam  General:   well-developed well-nourished male in no acute distress. He is overweight.  HEENT exam atraumatic, normocephalic, oropharynx is somewhat erythematous. Nasal mucosa somewhat red and erythematous. Chest clear to auscultation cardiac exam S1-S2 irregular.   Impression & Recommendations:  Problem # 1:  URI (ICD-465.9)  I think given his upcoming surgery he needs antibiotic therapy. We'll from doxycycline 100 mg by mouth twice a day. Side effects discussed. He'll call me if symptoms worsen. His updated medication list for this problem includes:    Aspirin Ec 325 Mg Tbec (Aspirin) ..... Once daily  Complete Medication List: 1)  Aspirin Ec 325 Mg Tbec (Aspirin) .... Once daily 2)  Lisinopril-hydrochlorothiazide 20-12.5 Mg Tabs (Lisinopril-hydrochlorothiazide) .... Take 2 tablet by mouth once a day 3)  Prilosec Otc 20 Mg Tbec (Omeprazole magnesium) .... One by mouth day 4)  Viagra 100 Mg Tabs (Sildenafil citrate) .... 1/2-1 by mouth once daily as needed. 5)  Fish Oil 1000 Mg Caps (Omega-3 fatty acids) .... Once daily 6)  Multivitamins Tabs (Multiple vitamin) .... Once daily 7)  Vitamin D 1000 Unit Caps (Cholecalciferol) .... Once daily 8)  Carvedilol 25 Mg Tabs (Carvedilol) .... One bid 9)  Niaspan 500 Mg Cr-tabs (Niacin (antihyperlipidemic)) .... Take 1 tablet by mouth once a day 10)  Doxycycline Hyclate 100 Mg Caps (Doxycycline hyclate) .... Take 1 tab twice a day Prescriptions: DOXYCYCLINE HYCLATE 100 MG CAPS (DOXYCYCLINE HYCLATE) Take 1 tab twice a day  #20 x 0   Entered and Authorized by:   Phoebe Sharps MD   Signed by:   Phoebe Sharps MD on 06/10/2010   Method used:   [id number]       [address]       [place], [place]  [address]       Ph: [phone]       Fax: [phone]   RxID:   [id number]    Orders Added: 1)  Est. Patient Level III [id number]

## 2010-07-08 NOTE — Assessment & Plan Note (Signed)
Summary: pt will come in fasting/njr   Vital Signs:  Patient Profile:   70 Years Old Male Height:     70.5 inches Weight:      244 pounds Pulse rate:   56 / minute Pulse rhythm:   regular BP sitting:   128 / 96  (left arm)  Vitals Entered By: Rica Records, RN (June 16, 2008 8:13 AM)                  Chief Complaint:  annual visit for disease management and fasting.  History of Present Illness:  SLEEP APNEA (ICD-780.57) VENTRICULAR TACHYCARDIA (ICD-427.1)--has had angiogram, has f/u with dr Claiborne Billings (06/24/08) HYPERGLYCEMIA (ICD-790.6)---no t following a diet or exercise plan TRANSAMINASES, SERUM, ELEVATED (ICD-790.4)---needs f/u HYPERTENSION (ICD-401.9)---tolerating meds without difficulty HYPERLIPIDEMIA (ICD-272.4)---currently not on any meds GERD (ICD-530.81)-doing well on meds  Past Medical History: GERD Hyperlipidemia Hypertension thrombocytopenia increased LFT's 2002 Hep B & C negative increased glucose  Past Surgical History: none as of 2000 Social History: Married Regular exercise-no  Family History:  no other complaints in a complete ROS       Updated Prior Medication List: ASPIRIN EC 325 MG TBEC (ASPIRIN) once daily LISINOPRIL-HYDROCHLOROTHIAZIDE 20-12.5 MG TABS (LISINOPRIL-HYDROCHLOROTHIAZIDE) Take 2 tablet by mouth once a day PRILOSEC OTC 20 MG TBEC (OMEPRAZOLE MAGNESIUM) one by mouth day METOPROLOL TARTRATE 25 MG TABS (METOPROLOL TARTRATE) 1/2 two times a day VIAGRA 100 MG TABS (SILDENAFIL CITRATE) 1/2-1 by mouth once daily as needed. FISH OIL 1000 MG  CAPS (OMEGA-3 FATTY ACIDS) once daily MULTIVITAMINS   TABS (MULTIPLE VITAMIN) once daily OSTEO BI-FLEX ADV DOUBLE ST  CAPS (MISC NATURAL PRODUCTS) 2 once daily VITAMIN D 1000 UNIT CAPS (CHOLECALCIFEROL) once daily  Current Allergies (reviewed today): No known allergies         Impression & Recommendations:  Problem # 1:  SLEEP APNEA (ICD-780.57) CPAP doing better Orders: Venipuncture  [id number])   Problem # 2:  VENTRICULAR TACHYCARDIA (ICD-427.1) f/u cardiology His updated medication list for this problem includes:    Aspirin Ec 325 Mg Tbec (Aspirin) ..... Once daily    Metoprolol Tartrate 25 Mg Tabs (Metoprolol tartrate) .Marland Kitchen... 1/2 two times a day   Problem # 3:  HYPERGLYCEMIA (ICD-790.6) reviewed outside labs a1c 6.7 %  Problem # 4:  TRANSAMINASES, SERUM, ELEVATED (ICD-790.4) has had evaluation  Problem # 5:  HYPERTENSION (ICD-401.9)  His updated medication list for this problem includes:    Lisinopril-hydrochlorothiazide 20-12.5 Mg Tabs (Lisinopril-hydrochlorothiazide) .Marland Kitchen... Take 2 tablet by mouth once a day    Metoprolol Tartrate 25 Mg Tabs (Metoprolol tartrate) .Marland Kitchen... 1/2 two times a day  BP today: 128/96 Prior BP: 134/78 (01/11/2008)  Labs Reviewed: Creat: 1.0 (04/03/2007) Chol: 162 (04/03/2007)   HDL: 28.0 (04/03/2007)   LDL: 96 (04/03/2007)   TG: 190 (04/03/2007)  Orders: Venipuncture [id number]) TLB-BMP (Basic Metabolic Panel-BMET) (99991111)   Problem # 6:  HYPERLIPIDEMIA (ICD-272.4)  Labs Reviewed: Chol: 162 (04/03/2007)   HDL: 28.0 (04/03/2007)   LDL: 96 (04/03/2007)   TG: 190 (04/03/2007) SGOT: 32 (04/03/2007)   SGPT: 58 (04/03/2007)  Orders: Venipuncture [id number]) TLB-Hepatic/Liver Function Pnl (80076-HEPATIC) TLB-TSH (Thyroid Stimulating Hormone) (84443-TSH) TLB-Lipid Panel (80061-LIPID)   Complete Medication List: 1)  Aspirin Ec 325 Mg Tbec (Aspirin) .... Once daily 2)  Lisinopril-hydrochlorothiazide 20-12.5 Mg Tabs (Lisinopril-hydrochlorothiazide) .... Take 2 tablet by mouth once a day 3)  Prilosec Otc 20 Mg Tbec (Omeprazole magnesium) .... One by mouth day 4)  Metoprolol Tartrate 25 Mg Tabs (Metoprolol tartrate) .Marland KitchenMarland KitchenMarland Kitchen  1/2 two times a day 5)  Viagra 100 Mg Tabs (Sildenafil citrate) .... 1/2-1 by mouth once daily as needed. 6)  Fish Oil 1000 Mg Caps (Omega-3 fatty acids) .... Once daily 7)  Multivitamins Tabs (Multiple vitamin) ....  Once daily 8)  Osteo Bi-flex Adv Double St Caps (Misc natural products) .... 2 once daily 9)  Vitamin D 1000 Unit Caps (Cholecalciferol) .... Once daily  Other Orders: TLB-CBC Platelet - w/Differential (85025-CBCD) TLB-PSA (Prostate Specific Antigen) (84153-PSA)    Prescriptions: VIAGRA 100 MG TABS (SILDENAFIL CITRATE) 1/2-1 by mouth once daily as needed.  #10 x 6   Entered and Authorized by:   Phoebe Sharps MD   Signed by:   Phoebe Sharps MD on 06/16/2008   Method used:   Print then Give to Patient   RxID:   [id number]    Cardiac Catheterization Report  Procedure date:  04/06/2007  Findings:       CONCLUSION:  Normal coronary angiography and normal left ventricular   function.      RECOMMENDATIONS:  Reassurance.  There is a minimal wall motion   abnormality in the inferior wall and not sure if this is related to the   short run of ventricular tachycardia with stress.  Dr. Domenic Polite   suggested adding a beta-blocker if his coronary angiogram looked good   and will try him on low-dose Toprol XL 25 mg a day and will follow up   with Dr. Domenic Polite in 2 weeks.               Jackeline Gutknecht Alfonso Patten Olevia Perches, MD, [facility]   Electronically Signed            BRB/MEDQ  D:  04/06/2007  T:  04/06/2007  Job:  [id number]      cc:   Darrick Penna. Ervin Hensley, MD   Satira Sark, MD   Vanna Scotland. Olevia Perches, MD, [facility]   Cardiopulmonary Lab    Physical Exam General Appearance: well developed, well nourished, no acute distress Eyes: conjunctiva and lids normal, PERRL, EOMI, Ears, Nose, Mouth, Throat: TM clear, nares clear, oral exam WNL Neck: supple, no lymphadenopathy, no thyromegaly, no JVD Respiratory: clear to auscultation and percussion, respiratory effort normal Cardiovascular: regular rate and rhythm, S1-S2, no murmur, rub or gallop, no bruits, peripheral pulses normal and symmetric, no cyanosis, clubbing, edema or varicosities Chest: no scars, masses, tenderness; no asymmetry, skin changes, nipple  discharge, no gynecomastia   Gastrointestinal: soft, non-tender; no hepatosplenomegaly, masses; active bowel sounds all quadrants,  no masses, tenderness, hemorrhoids  Genitourinary: no hernia, testicular mass,  or prostate enlargement Lymphatic: no cervical, axillary or inguinal adenopathy Musculoskeletal: gait normal, muscle tone and strength WNL, no joint swelling, effusions, discoloration, crepitus  Skin: clear, good turgor, color WNL, no rashes, lesions, or ulcerations Neurologic: normal mental status, normal reflexes, normal strength, sensation, and motion Psychiatric: alert; oriented to person, place and time Other Exam:   Appended Document: pt will come in fasting/njr   Pneumovax Vaccine    Vaccine Type: Pneumovax (Medicare)    Site: left deltoid    Mfr: Merck    Dose: 0.5 ml    Route: [place]    Given by: Rica Records, RN    Exp. Date: 01/01/2009    Lot #: [id number]

## 2010-07-08 NOTE — Letter (Signed)
[facility]: [facility]   Imported By: [name] 09/23/2008 15:24:58  _____________________________________________________________________  External Attachment:    Type:   Image     Comment:   External Document

## 2010-07-08 NOTE — Procedures (Signed)
Summary: Colonoscopy with snare polypectomy and biopsy/[facility]  Colonoscopy with snare polypectomy and biopsy/[facility]   Imported By: [name] 10/02/2008 10:50:31  _____________________________________________________________________  External Attachment:    Type:   Image     Comment:   External Document

## 2010-07-08 NOTE — Letter (Signed)
Summary: [facility]   Imported By: [name] 09/23/2008 12:56:39  _____________________________________________________________________  External Attachment:    Type:   Image     Comment:   External Document

## 2010-07-08 NOTE — Progress Notes (Signed)
Summary: CHANGED GI CARE TO [facility]   Phone Note Outgoing Call Call back at [facility] Phone [phone]   Call placed by: Bernita Buffy CMA Deborra Medina),  December 22, 2009 3:56 PM Call placed to: Patient Summary of Call: called to check on pts recall colonoscopy, he states that he went to Dr. Earlean Shawl and does nto need another colonoscopy until 2015. The reoprt is scanned into the EMR and I have had Barbie Haggis put a note in [facility] that the patient has changed care.  Initial call taken by: Bernita Buffy CMA Deborra Medina),  December 22, 2009 3:57 PM

## 2010-07-08 NOTE — Assessment & Plan Note (Signed)
Summary: pain in lft lower back and hip/cjr   Vital Signs:  Patient profile:   70 year old male Weight:      230 pounds Temp:     98.0 degrees F oral BP sitting:   140 / 90  (left arm) Cuff size:   regular  Vitals Entered By: Westley Hummer CMA Deborra Medina) (February 05, 2010 11:41 AM) CC: lower back pain, follow up labs Is Patient Diabetic? No Pain Assessment Patient in pain? yes     Location: lower back Intensity: 3 Type: burning   CC:  lower back pain and follow up labs.  History of Present Illness: Back pain acute onset yesterday--- pain was moderate intensity lasted all day long heating pad helped  pain better today.   has appt tuesday for f/u  Allergies: No Known Drug Allergies  Past History:  Past Medical History: Last updated: 12/07/2006 GERD Hyperlipidemia Hypertension thrombocytopenia increased LFT's 2002 Hep B & C negative increased glucose  Past Surgical History: Last updated: 10/01/2008  Cholecystectomy  Family History: Last updated: 07/06/2009 father--kidney failure 1 yo mother CHF age 51 yo Family History Diabetes 1st degree relative---mother and father  Social History: Last updated: 03/27/2007 Married Regular exercise-no  Risk Factors: Exercise: no (03/27/2007)  Risk Factors: Smoking Status: never (11/10/2009)  Physical Exam  General:  Well-developed,well-nourished,in no acute distress; alert,appropriate and cooperative throughout examination Head:  normocephalic, atraumatic, and no abnormalities observed.   Eyes:  pupils equal and pupils round.   Ears:  R ear normal and L ear normal.   Neck:  No deformities, masses, or tenderness noted. Chest Wall:  no deformities and no tenderness.   Lungs:  Normal respiratory effort, chest expands symmetrically. Lungs are clear to auscultation, no crackles or wheezes. Heart:  Normal rate and regular rhythm. S1 and S2 normal without gallop, murmur, click, rub or other extra  sounds. Abdomen:  Bowel sounds positive,abdomen soft and non-tender without masses, organomegaly or hernias noted.  overweight Rectal:  no external abnormalities and no hemorrhoids.   Skin:  turgor normal and color normal.   Psych:  normally interactive and good eye contact.     Impression & Recommendations:  Problem # 1:  BACK PAIN (ICD-724.5) suspect soft tissue injury trial meds side effects discussed His updated medication list for this problem includes:    Aspirin Ec 325 Mg Tbec (Aspirin) ..... Once daily    Cyclobenzaprine Hcl 10 Mg Tabs (Cyclobenzaprine hcl) .Marland Kitchen... 1 by mouth 2 times daily as needed for back pain  Problem # 2:  HYPERTENSION (ICD-401.9) reasonable control continue current medications  His updated medication list for this problem includes:    Lisinopril-hydrochlorothiazide 20-12.5 Mg Tabs (Lisinopril-hydrochlorothiazide) .Marland Kitchen... Take 2 tablet by mouth once a day    Carvedilol 25 Mg Tabs (Carvedilol) ..... One bid  BP today: 140/90 Prior BP: 124/76 (11/10/2009)  Labs Reviewed: K+: 5.1 (02/02/2010) Creat: : 1.6 (02/02/2010)   Chol: 164 (02/02/2010)   HDL: 34.30 (02/02/2010)   LDL: 91 (02/02/2010)   TG: 195.0 (02/02/2010)  Problem # 3:  HYPERLIPIDEMIA (ICD-272.4) reasonable control continue current medications  His updated medication list for this problem includes:    Niaspan 500 Mg Cr-tabs (Niacin (antihyperlipidemic)) .Marland Kitchen... Take 1 tablet by mouth once a day  Labs Reviewed: SGOT: 19 (02/02/2010)   SGPT: 30 (02/02/2010)   HDL:34.30 (02/02/2010), 32.80 (11/05/2009)  LDL:91 (02/02/2010), 92 (11/05/2009)  Chol:164 (02/02/2010), 156 (11/05/2009)  Trig:195.0 (02/02/2010), 155.0 (11/05/2009)  Problem # 4:  THROMBOCYTOPENIA (ICD-287.5)  Complete Medication  List: 1)  Aspirin Ec 325 Mg Tbec (Aspirin) .... Once daily 2)  Lisinopril-hydrochlorothiazide 20-12.5 Mg Tabs (Lisinopril-hydrochlorothiazide) .... Take 2 tablet by mouth once a day 3)  Prilosec Otc 20 Mg  Tbec (Omeprazole magnesium) .... One by mouth day 4)  Viagra 100 Mg Tabs (Sildenafil citrate) .... 1/2-1 by mouth once daily as needed. 5)  Fish Oil 1000 Mg Caps (Omega-3 fatty acids) .... Once daily 6)  Multivitamins Tabs (Multiple vitamin) .... Once daily 7)  Vitamin D 1000 Unit Caps (Cholecalciferol) .... Once daily 8)  Carvedilol 25 Mg Tabs (Carvedilol) .... One bid 9)  Niaspan 500 Mg Cr-tabs (Niacin (antihyperlipidemic)) .... Take 1 tablet by mouth once a day 10)  Cyclobenzaprine Hcl 10 Mg Tabs (Cyclobenzaprine hcl) .Marland Kitchen.. 1 by mouth 2 times daily as needed for back pain  Patient Instructions: 1)  Please schedule a follow-up appointment in 6 months. medicare wellness Prescriptions: CYCLOBENZAPRINE HCL 10 MG  TABS (CYCLOBENZAPRINE HCL) 1 by mouth 2 times daily as needed for back pain  #20 x 1   Entered and Authorized by:   Phoebe Sharps MD   Signed by:   Phoebe Sharps MD on 02/05/2010   Method used:   [facility]        [phone][facility]retail[facility]       [address]       [place], [place]  [address]       Ph: [phone]       Fax: [phone]   RxID:   [id number] CYCLOBENZAPRINE HCL 10 MG  TABS (CYCLOBENZAPRINE HCL) 1 by mouth 2 times daily as needed for back pain  #20 x 0   Entered and Authorized by:   Phoebe Sharps MD   Signed by:   Phoebe Sharps MD on 02/05/2010   Method used:   Electronically to        [facility] (retail)       [place] [address]       [place], [place]  [address]       Ph: [phone]       Fax: [phone]   RxID:   [id number]

## 2010-07-08 NOTE — Progress Notes (Signed)
Summary: RX FOR C PAP MACHINE  Phone Note Call from Patient Call back at Home Phone [phone]   Caller: PT VM TRIAGE Call For: Martin Cordova Summary of Call: Martin Cordova.  TRIED C PAP MACHINE AND TURNED IUT BACK IN.  COULD DR Comer Devins WRITE A NEW RX FOR A C PAP AS HE HAS HEARD THEY HAVE SOME NEW MASKS AND BETTER RESULTS  Initial call taken by: Shanon Payor,  September 25, 2007 4:03 PM  Follow-up for Phone Call        Reviewed and forwarded to Dr Leanne Chang. ..................................................................Marland KitchenNira Conn LPN  April 21, [id number] PM   Additional Follow-up for Phone Call Additional follow up Details #1::        ok to provide rx Additional Follow-up by: Phoebe Sharps MD,  September 26, 2007 7:02 AM    Additional Follow-up for Phone Call Additional follow up Details #2::    Left message to pick up RX at front window. Follow-up by: Shelbie Hutching, RN,  September 26, 2007 10:22 AM

## 2010-07-08 NOTE — Assessment & Plan Note (Signed)
Summary: CPX/MHF   Vital Signs:  Patient Profile:   70 Years Old Male Height:     70.5 inches Weight:      238 pounds Temp:     98.2 degrees F oral Pulse rate:   78 / minute Pulse rhythm:   regular Resp:     12 per minute BP sitting:   144 / 86  Vitals Entered By: Deanna Artis CMA (April 27, 2007 3:18 PM)                 Chief Complaint:  cpx.  History of Present Illness: cpx  Current Allergies: No known allergies   Past Medical History:    Reviewed history from 12/07/2006 and no changes required:       GERD       Hyperlipidemia       Hypertension       thrombocytopenia       increased LFT's 2002 Hep B & C negative       increased glucose  Past Surgical History:    Reviewed history from 12/07/2006 and no changes required:       none as of 2000   Social History:    Reviewed history from 03/27/2007 and no changes required:       Married       Regular exercise-no    Review of Systems       no other complaints in a complete ROS    Physical Exam  General:     Well-developed,well-nourished,in no acute distress; alert,appropriate and cooperative throughout examination Head:     normocephalic and atraumatic.   Eyes:     pupils equal and pupils round.   Neck:     No deformities, masses, or tenderness noted. Chest Wall:     No deformities, masses, tenderness or gynecomastia noted. Lungs:     Normal respiratory effort, chest expands symmetrically. Lungs are clear to auscultation, no crackles or wheezes. Heart:     Normal rate and regular rhythm. S1 and S2 normal without gallop, murmur, click, rub or other extra sounds. Abdomen:     Bowel sounds positive,abdomen soft and non-tender without masses, organomegaly or hernias noted. Prostate:     Prostate gland firm and smooth, no enlargement, nodularity, tenderness, mass, asymmetry or induration. Msk:     No deformity or scoliosis noted of thoracic or lumbar spine.   Pulses:     R and L  carotid,radial,femoral,dorsalis pedis and posterior tibial pulses are full and equal bilaterally Extremities:     No clubbing, cyanosis, edema, or deformity noted with normal full range of motion of all joints.   Neurologic:     No cranial nerve deficits noted. Station and gait are normal. Plantar reflexes are down-going bilaterally. DTRs are symmetrical throughout. Sensory, motor and coordinative functions appear intact. Skin:     Intact without suspicious lesions or rashes Cervical Nodes:     No lymphadenopathy noted Axillary Nodes:     No palpable lymphadenopathy Inguinal Nodes:     No significant adenopathy Psych:     Cognition and judgment appear intact. Alert and cooperative with normal attention span and concentration. No apparent delusions, illusions, hallucinations    Impression & Recommendations:  Problem # 1:  [place] (ICD-V70.0)  Complete Medication List: 1)  Aspir-81 81 Mg Tbec (Aspirin) .... Take 1 tablet by mouth once a day 2)  Lisinopril-hydrochlorothiazide 20-12.5 Mg Tabs (Lisinopril-hydrochlorothiazide) .... Take 2 tablet by mouth once a day 3)  Prilosec Otc 20 Mg Tbec (Omeprazole magnesium) .... One by mouth day 4)  Metoprolol Succinate 25 Mg Tb24 (Metoprolol succinate) .... One by mouth daily 5)  Viagra 100 Mg Tabs (Sildenafil citrate) .... 1/2-1 by mouth once daily as needed.     Prescriptions: VIAGRA 100 MG TABS (SILDENAFIL CITRATE) 1/2-1 by mouth once daily as needed.  #10 x 6   Entered and Authorized by:   Phoebe Sharps MD   Signed by:   Phoebe Sharps MD on 04/27/2007   Method used:   Printed then [facility]ed [facility] ...       [facility] [address] [id number]       [address], [place]  [address]       Ph: [phone] or [phone]       Fax: [phone]   RxID:   [id number] PRILOSEC OTC 20 MG TBEC (OMEPRAZOLE MAGNESIUM) one by mouth day  #100 x 3   Entered and Authorized by:   Phoebe Sharps MD   Signed  by:   Phoebe Sharps MD on 04/27/2007   Method used:   Electronically sent to ...       [facility] [id number]       [address], [place]  [address]       Ph: [phone] or [phone]       Fax: [phone]   RxID:   [id number] LISINOPRIL-HYDROCHLOROTHIAZIDE 20-12.5 MG TABS (LISINOPRIL-HYDROCHLOROTHIAZIDE) Take 2 tablet by mouth once a day  #200 x 3   Entered and Authorized by:   Phoebe Sharps MD   Signed by:   Phoebe Sharps MD on 04/27/2007   Method used:   Electronically sent to ...       [facility] [address] [id number]       [address]       [place], [place]  [address]       Ph: [phone] or [phone]       Fax: [phone]   RxID:   [id number]  ]

## 2010-07-08 NOTE — Assessment & Plan Note (Signed)
Summary: cpap reevaluation/mhf   Vital Signs:  Patient Profile:   70 Years Old Male Height:     70.5 inches Weight:      241 pounds Temp:     98.4 degrees F Pulse rate:   60 / minute BP sitting:   134 / 78  (left arm)  Vitals Entered By: Rica Records, RN (January 11, 2008 9:01 AM)                 Chief Complaint:  CPAP re-evaluation--states is working well.  History of Present Illness:  SLEEP APNEA (ICD-780.57)---using CPAP HYPERGLYCEMIA (ICD-790.6)---no sxs TRANSAMINASES, SERUM, ELEVATED (ICD-790.4)---has had evaluation---presumed fatty liver HYPERTENSION (ICD-401.9)---no sxs on meds HYPERLIPIDEMIA (ICD-272.4)---currently on no meds GERD (ICD-530.81)---no sxs on meds  Past Medical History: GERD Hyperlipidemia Hypertension thrombocytopenia increased LFT's 2002 Hep B & C negative increased glucose  Past Surgical History: none as of 2000 Social History: Married Regular exercise-no  Family History:   no other complaints in a complete ROS       Prior Medications Reviewed Using: Patient Recall  Updated Prior Medication List: ASPIR-81 81 MG TBEC (ASPIRIN) Take 1 tablet by mouth once a day LISINOPRIL-HYDROCHLOROTHIAZIDE 20-12.5 MG TABS (LISINOPRIL-HYDROCHLOROTHIAZIDE) Take 2 tablet by mouth once a day PRILOSEC OTC 20 MG TBEC (OMEPRAZOLE MAGNESIUM) one by mouth day METOPROLOL SUCCINATE 25 MG  TB24 (METOPROLOL SUCCINATE) take 1/2 two times a day and an additional 1/2 if needed VIAGRA 100 MG TABS (SILDENAFIL CITRATE) 1/2-1 by mouth once daily as needed. FISH OIL 1000 MG  CAPS (OMEGA-3 FATTY ACIDS) once daily MULTIVITAMINS   TABS (MULTIPLE VITAMIN) once daily  Current Allergies (reviewed today): No known allergies         Impression & Recommendations:  Problem # 1:  SLEEP APNEA (ICD-780.57) has been using CPAP feels much better forms completed for [place] [facility] 25 minutes total time he is using CPAP daily---reviewed outside records---utilization  joune-july 2009  Problem # 2:  TRANSAMINASES, SERUM, ELEVATED (ICD-790.4)  Problem # 3:  HYPERGLYCEMIA (ICD-790.6)  Problem # 4:  GERD (ICD-530.81) asymptomatice His updated medication list for this problem includes:    Prilosec Otc 20 Mg Tbec (Omeprazole magnesium) ..... One by mouth day   Problem # 5:  HYPERTENSION (ICD-401.9)  His updated medication list for this problem includes:    Lisinopril-hydrochlorothiazide 20-12.5 Mg Tabs (Lisinopril-hydrochlorothiazide) .Marland Kitchen... Take 2 tablet by mouth once a day    Metoprolol Succinate 25 Mg Tb24 (Metoprolol succinate) .Marland Kitchen... Take 1/2 two times a day and an additional 1/2 if needed  BP today: 134/78 Prior BP: 136/68 (10/26/2007)  Labs Reviewed: Creat: 1.0 (04/03/2007) Chol: 162 (04/03/2007)   HDL: 28.0 (04/03/2007)   LDL: 96 (04/03/2007)   TG: 190 (04/03/2007)   Complete Medication List: 1)  Aspir-81 81 Mg Tbec (Aspirin) .... Take 1 tablet by mouth once a day 2)  Lisinopril-hydrochlorothiazide 20-12.5 Mg Tabs (Lisinopril-hydrochlorothiazide) .... Take 2 tablet by mouth once a day 3)  Prilosec Otc 20 Mg Tbec (Omeprazole magnesium) .... One by mouth day 4)  Metoprolol Succinate 25 Mg Tb24 (Metoprolol succinate) .... Take 1/2 two times a day and an additional 1/2 if needed 5)  Viagra 100 Mg Tabs (Sildenafil citrate) .... 1/2-1 by mouth once daily as needed. 6)  Fish Oil 1000 Mg Caps (Omega-3 fatty acids) .... Once daily 7)  Multivitamins Tabs (Multiple vitamin) .... Once daily    ]

## 2010-07-08 NOTE — Procedures (Signed)
Summary: Colonoscopy Report/[facility]  Colonoscopy Report/[facility]   Imported By: [name] 07/09/2008 14:37:13  _____________________________________________________________________  External Attachment:    Type:   Image     Comment:   External Document

## 2010-07-08 NOTE — Progress Notes (Signed)
Summary: copy of labs  Phone Note Call from Patient Call back at [phone]   Caller: pt live Call For: Swords Summary of Call: Patient needs caopy of his labs done 06-16-2008, also he needs the lab results he got from [facility] bural and he needs it back. Initial call taken by: Alinda Deem,  June 18, 2008 1:19 PM  Follow-up for Phone Call        unable to locate labs from [facility].  Called pt and told him our medical records will try to get copy sent to [place].  When we have both he can have copies and we will let him know they are rready.  Also wants copy of labs sent to Dr Shelva Majestic, his cardio;logist. Follow-up by: Rica Records, RN,  June 19, 2008 5:34 PM  Additional Follow-up for Phone Call Additional follow up Details #1::        [facility].Patient notified. copies of our labs and [facility] labs at front desk for pik up.Patient notified.  Additional Follow-up by: Rica Records, RN,  June 20, 2008 2:36 PM

## 2010-07-08 NOTE — Progress Notes (Signed)
Summary: request from Dr. Leanne Chang  Medications Added ASPIR-81 81 MG TBEC (ASPIRIN) Take 1 tablet by mouth once a day CINNAMON 500 MG CAPS (CINNAMON) Take once a day LISINOPRIL-HYDROCHLOROTHIAZIDE 20-12.5 MG TABS (LISINOPRIL-HYDROCHLOROTHIAZIDE) Take 2 tablet by mouth once a day MECLIZINE HCL 25 MG TABS (MECLIZINE HCL) Take 1 tablet by mouth twice a day PRILOSEC OTC 20 MG TBEC (OMEPRAZOLE MAGNESIUM)        Phone Note Call from Patient   Caller: Patient Reason for Call: Lab or Test Results Action Taken: Provider Notified Summary of Call: Pt would like to get Stress test results, please. Initial call taken by: Deanna Artis CMA,  January 05, 2007 2:24 PM  Follow-up for Phone Call        Ask Dr. Arnoldo Morale to review and then call patient. thanks. Follow-up by: Phoebe Sharps MD,  January 05, 2007 5:16 PM  Additional Follow-up for Phone Call Additional follow up Details #1::        The test is not in the electronic chart so the results are pending. Ususally cardiology calls for "bad results" Dr swords will review and call pt when he returns next week. Additional Follow-up by: Ricard Dillon MD,  January 08, 2007 10:20 AM   New Allergies: NIASPAN (NIACIN (ANTIHYPERLIPIDEMIC)) Additional Follow-up for Phone Call Additional follow up Details #2::    Get results. If the report says normal or nonischemic please inform the patient.   Entered by Dr. Leanne Chang.  Additional Follow-up for Phone Call Additional follow up Details #3:: Details for Additional Follow-up Action Taken: Pt given results of normal testing 98/94/2008 at 6:30 pm Additional Follow-up by: Deanna Artis CMA,  January 09, 2007 9:15 AM  New/Updated Medications: ASPIR-81 81 MG TBEC (ASPIRIN) Take 1 tablet by mouth once a day CINNAMON 500 MG CAPS (CINNAMON) Take once a day LISINOPRIL-HYDROCHLOROTHIAZIDE 20-12.5 MG TABS (LISINOPRIL-HYDROCHLOROTHIAZIDE) Take 2 tablet by mouth once a day MECLIZINE HCL 25 MG TABS (MECLIZINE HCL) Take 1  tablet by mouth twice a day PRILOSEC OTC 20 MG TBEC (OMEPRAZOLE MAGNESIUM)  New Allergies: NIASPAN (NIACIN (ANTIHYPERLIPIDEMIC))

## 2010-07-08 NOTE — Letter (Signed)
Summary: [facility]  [facility]   Imported By: [name] 07/21/2009 13:52:33  _____________________________________________________________________  External Attachment:    Type:   Image     Comment:   External Document

## 2010-07-08 NOTE — Miscellaneous (Signed)
Summary: Orders Update, C-Pap   Clinical Lists Changes  Problems: Added new problem of SLEEP APNEA (ICD-780.57) Orders: Added new Test order of Durable Medical Equipment (DME) - Signed

## 2010-07-08 NOTE — Assessment & Plan Note (Signed)
Summary: coughing,chest congestion/jls   Vital Signs:  Patient profile:   70 year old male Weight:      238 pounds Temp:     98.2 degrees F BP sitting:   122 / 82  Vitals Entered By: Deanna Artis CMA (October 01, 2008 9:33 AM) CC: c/o cough and congestion.  recent GB surgery, URI symptoms Is Patient Diabetic? No Pain Assessment Patient in pain? no        CC:  c/o cough and congestion.  recent GB surgery and URI symptoms.  History of Present Illness:  URI Symptoms      This is a 70 year old man who presents with URI symptoms.  The symptoms began 6 days ago.  The severity is described as mild.  The patient reports nasal congestion and dry cough, but denies clear nasal discharge, purulent nasal discharge, sore throat, productive cough, earache, and sick contacts.  The patient denies fever, stiff neck, dyspnea, wheezing, rash, vomiting, diarrhea, use of an antipyretic, and response to antipyretic.  The patient denies itchy watery eyes, itchy throat, sneezing, response to antihistamine, and headache.  The patient denies the following risk factors for Strep sinusitis: unilateral facial pain and double sickening.   sxs started after GB surgery  Current Problems (verified): 1)  Sleep Apnea  (ICD-780.57) 2)  Preventive Health Care  (ICD-V70.0) 3)  Dyspnea/shortness of Breath  (ICD-786.09) 4)  Ventricular Tachycardia  (ICD-427.1) 5)  Hyperglycemia  (ICD-790.6) 6)  Transaminases, Serum, Elevated  (ICD-790.4) 7)  Thrombocytopenia  (ICD-287.5) 8)  Hypertension  (ICD-401.9) 9)  Hyperlipidemia  (ICD-272.4) 10)  Gerd  (ICD-530.81)  Current Medications (verified): 1)  Aspirin Ec 325 Mg Tbec (Aspirin) .... Once Daily 2)  Lisinopril-Hydrochlorothiazide 20-12.5 Mg Tabs (Lisinopril-Hydrochlorothiazide) .... Take 2 Tablet By Mouth Once A Day 3)  Prilosec Otc 20 Mg Tbec (Omeprazole Magnesium) .... One By Mouth Day 4)  Viagra 100 Mg Tabs (Sildenafil Citrate) .... 1/2-1 By Mouth Once Daily As  Needed. 5)  Fish Oil 1000 Mg  Caps (Omega-3 Fatty Acids) .... Once Daily 6)  Multivitamins   Tabs (Multiple Vitamin) .... Once Daily 7)  Osteo Bi-Flex Adv Double St  Caps (Misc Natural Products) .... 2 Once Daily 8)  Vitamin D 1000 Unit Caps (Cholecalciferol) .... Once Daily 9)  Carvedilol 25 Mg Tabs (Carvedilol) .... One Bid  Allergies (verified): 1)  Niaspan (Niacin (Antihyperlipidemic))  Past History:  Past Surgical History:        Cholecystectomy  Review of Systems       no other complaints in a complete ROS   Physical Exam  General:  Well-developed,well-nourished,in no acute distress; alert,appropriate and cooperative throughout examination Head:  normocephalic and atraumatic.   Eyes:  pupils equal and pupils round.   Neck:  No deformities, masses, or tenderness noted. Lungs:  Normal respiratory effort, chest expands symmetrically. Lungs are clear to auscultation, no crackles or wheezes. Heart:  Normal rate and regular rhythm. S1 and S2 normal without gallop, murmur, click, rub or other extra sounds.   Impression & Recommendations:  Problem # 1:  URI (ICD-465.9) no evidence of bacterial infection. call for any concerns, increased sxs, fever, persistence of sxs, wheeze, SOB.   His updated medication list for this problem includes:    Aspirin Ec 325 Mg Tbec (Aspirin) ..... Once daily    Maxichlor Dm 4-20 Mg Tabs (Chlorpheniramine-dm) .Marland Kitchen..Marland Kitchen Two times a day tab    Hydrocodone-homatropine 5-1.5 Mg/39ml Syrp (Hydrocodone-homatropine) .Marland Kitchen... 1 tsp by mouth two times a  day as needed cough  Complete Medication List: 1)  Aspirin Ec 325 Mg Tbec (Aspirin) .... Once daily 2)  Lisinopril-hydrochlorothiazide 20-12.5 Mg Tabs (Lisinopril-hydrochlorothiazide) .... Take 2 tablet by mouth once a day 3)  Prilosec Otc 20 Mg Tbec (Omeprazole magnesium) .... One by mouth day 4)  Viagra 100 Mg Tabs (Sildenafil citrate) .... 1/2-1 by mouth once daily as needed. 5)  Fish Oil 1000 Mg Caps  (Omega-3 fatty acids) .... Once daily 6)  Multivitamins Tabs (Multiple vitamin) .... Once daily 7)  Osteo Bi-flex Adv Double St Caps (Misc natural products) .... 2 once daily 8)  Vitamin D 1000 Unit Caps (Cholecalciferol) .... Once daily 9)  Carvedilol 25 Mg Tabs (Carvedilol) .... One bid 10)  Maxichlor Dm 4-20 Mg Tabs (Chlorpheniramine-dm) .... Two times a day tab 11)  Hydrocodone-homatropine 5-1.5 Mg/15ml Syrp (Hydrocodone-homatropine) .Marland Kitchen.. 1 tsp by mouth two times a day as needed cough Prescriptions: HYDROCODONE-HOMATROPINE 5-1.5 MG/5ML SYRP (HYDROCODONE-HOMATROPINE) 1 tsp by mouth two times a day as needed cough  #120 cc x 0   Entered and Authorized by:   Phoebe Sharps MD   Signed by:   Phoebe Sharps MD on 10/01/2008   Method used:   Print then Give to Patient   RxID:   [id number]

## 2010-07-08 NOTE — Letter (Signed)
[facility]: [facility]   Imported By: [name] 10/17/2008 08:38:13  _____________________________________________________________________  External Attachment:    Type:   Image     Comment:   External Document

## 2010-07-08 NOTE — Assessment & Plan Note (Signed)
Summary: pt will come in fasting/njr   Vital Signs:  Patient profile:   70 year old male Height:      70.5 inches Weight:      245 pounds BMI:     34.78 Pulse rate:   56 / minute Resp:     12 per minute BP sitting:   134 / 78  (left arm)  Vitals Entered By: Rica Records, RN (July 06, 2009 8:07 AM) CC: annual review, fasting Is Patient Diabetic? No Pain Assessment Patient in pain? no      Nutritional Status BMI of > 30 = obese   CC:  annual review and fasting.  History of Present Illness: medicare  wellness visit  also f/u multiple medical problems Current Problems:  SLEEP APNEA (ICD-780.57)---tolerating CPAP VENTRICULAR TACHYCARDIA (ICD-427.1)---no recurrence (followed by dr. Claiborne Billings) HYPERGLYCEMIA (ICD-790.6)---needs f/u TRANSAMINASES, SERUM, ELEVATED (ICD-790.4)---needs .abs today- has been evaluated for viral hepatitis in past HYPERTENSION (ICD-401.9)---tolerating meds without difficulty GERD (ICD-530.81)---tolerating meds  All other systems reviewed and were negative     Preventive Screening-Counseling & Management  Alcohol-Tobacco     Smoking Status: never  Current Problems (verified): 1)  Sleep Apnea  (ICD-780.57) 2)  Preventive Health Care  (ICD-V70.0) 3)  Ventricular Tachycardia  (ICD-427.1) 4)  Hyperglycemia  (ICD-790.6) 5)  Transaminases, Serum, Elevated  (ICD-790.4) 6)  Thrombocytopenia  (ICD-287.5) 7)  Hypertension  (ICD-401.9) 8)  Hyperlipidemia  (ICD-272.4) 9)  Gerd  (ICD-530.81)  Current Medications (verified): 1)  Aspirin Ec 325 Mg Tbec (Aspirin) .... Once Daily 2)  Lisinopril-Hydrochlorothiazide 20-12.5 Mg Tabs (Lisinopril-Hydrochlorothiazide) .... Take 2 Tablet By Mouth Once A Day 3)  Prilosec Otc 20 Mg Tbec (Omeprazole Magnesium) .... One By Mouth Day 4)  Viagra 100 Mg Tabs (Sildenafil Citrate) .... 1/2-1 By Mouth Once Daily As Needed. 5)  Fish Oil 1000 Mg  Caps (Omega-3 Fatty Acids) .... Once Daily 6)  Multivitamins   Tabs (Multiple  Vitamin) .... Once Daily 7)  Vitamin D 1000 Unit Caps (Cholecalciferol) .... Once Daily 8)  Carvedilol 25 Mg Tabs (Carvedilol) .... One Bid  Allergies: 1)  Niaspan (Niacin (Antihyperlipidemic))  Comments:  Nurse/Medical Assistant: annual review of systems, fasting  The patient's medications and allergies were reviewed with the patient and were updated in the Medication and Allergy Lists. Rica Records, RN (July 06, 2009 8:09 AM)  Past History:  Past Medical History: Last updated: 12/07/2006 GERD Hyperlipidemia Hypertension thrombocytopenia increased LFT's 2002 Hep B & C negative increased glucose  Past Surgical History: Last updated: 10/01/2008  Cholecystectomy  Social History: Last updated: 03/27/2007 Married Regular exercise-no  Risk Factors: Exercise: no (03/27/2007)  Risk Factors: Smoking Status: never (07/06/2009)  Family History: father--kidney failure 55 yo mother CHF age 59 yo Family History Diabetes 1st degree relative---mother and father  Review of Systems       All other systems reviewed and were negative   Physical Exam  General:  Well-developed,well-nourished,in no acute distress; alert,appropriate and cooperative throughout examination Head:  normocephalic and atraumatic.   Eyes:  pupils equal and pupils round.   Ears:  R ear normal and L ear normal.   Nose:  no external deformity and no external erythema.   Neck:  No deformities, masses, or tenderness noted. Chest Wall:  no deformities and no tenderness.   Lungs:  Normal respiratory effort, chest expands symmetrically. Lungs are clear to auscultation, no crackles or wheezes. Heart:  Normal rate and regular rhythm. S1 and S2 normal without gallop, murmur, click, rub or other  extra sounds. Abdomen:  Bowel sounds positive,abdomen soft and non-tender without masses, organomegaly or hernias noted.  overweight Rectal:  no external abnormalities and no hemorrhoids.   Prostate:  no nodules and  no asymmetry.   Msk:  No deformity or scoliosis noted of thoracic or lumbar spine.   Pulses:  R radial normal and L radial normal.   Neurologic:  cranial nerves II-XII intact and gait normal.   Skin:  Intact without suspicious lesions or rashes Cervical Nodes:  No lymphadenopathy noted Psych:  Oriented X3 and good eye contact.     Impression & Recommendations:  Problem # 1:  PREVENTIVE HEALTH CARE (ICD-V70.0)  helath maint utd tetanus immunization given EKG requested from Dr. Claiborne Billings  Orders: TLB-PSA (Prostate Specific Antigen) (84153-PSA) EMR Misc Charge Code [facility])  Problem # 2:  HYPERGLYCEMIA (ICD-790.6) check labs today  Problem # 3:  TRANSAMINASES, SERUM, ELEVATED (ICD-790.4) check today for stability lab elevation likely secondary to obesity and fatty liver  Problem # 4:  HYPERTENSION (ICD-401.9) reasonable control check labs today His updated medication list for this problem includes:    Lisinopril-hydrochlorothiazide 20-12.5 Mg Tabs (Lisinopril-hydrochlorothiazide) .Marland Kitchen... Take 2 tablet by mouth once a day    Carvedilol 25 Mg Tabs (Carvedilol) ..... One bid  Orders: TLB-BMP (Basic Metabolic Panel-BMET) (99991111)  Problem # 5:  GERD (ICD-530.81) well controlled on meds His updated medication list for this problem includes:    Prilosec Otc 20 Mg Tbec (Omeprazole magnesium) ..... One by mouth day  Problem # 6:  HYPERLIPIDEMIA (ICD-272.4) this is really a "low hdl" problem will recheck today labs would likely improve with weight loss Orders: TLB-Lipid Panel (80061-LIPID)  Labs Reviewed: SGOT: 43 (06/16/2008)   SGPT: 75 (06/16/2008)   HDL:24.5 (06/16/2008), 28.0 (04/03/2007)  LDL:93 (06/16/2008), 96 (04/03/2007)  Chol:151 (06/16/2008), 162 (04/03/2007)  Trig:169 (06/16/2008), 190 (04/03/2007)  Complete Medication List: 1)  Aspirin Ec 325 Mg Tbec (Aspirin) .... Once daily 2)  Lisinopril-hydrochlorothiazide 20-12.5 Mg Tabs (Lisinopril-hydrochlorothiazide)  .... Take 2 tablet by mouth once a day 3)  Prilosec Otc 20 Mg Tbec (Omeprazole magnesium) .... One by mouth day 4)  Viagra 100 Mg Tabs (Sildenafil citrate) .... 1/2-1 by mouth once daily as needed. 5)  Fish Oil 1000 Mg Caps (Omega-3 fatty acids) .... Once daily 6)  Multivitamins Tabs (Multiple vitamin) .... Once daily 7)  Vitamin D 1000 Unit Caps (Cholecalciferol) .... Once daily 8)  Carvedilol 25 Mg Tabs (Carvedilol) .... One bid  Other Orders: TD Toxoids IM 7 YR + [id number]) Admin 1st Vaccine [id number]) TLB-Hepatic/Liver Function Pnl (80076-HEPATIC) TLB-CBC Platelet - w/Differential (85025-CBCD) Venipuncture [id number]) Prescriptions: VIAGRA 100 MG TABS (SILDENAFIL CITRATE) 1/2-1 by mouth once daily as needed.  #10 x 6   Entered and Authorized by:   Phoebe Sharps MD   Signed by:   Phoebe Sharps MD on 07/06/2009   Method used:   Print then Give to Patient   RxID:   [id number]    Immunization History:  Influenza Immunization History:    Influenza:  historical (03/19/2009)  Immunizations Administered:  Tetanus Vaccine:    Vaccine Type: Td    Site: left deltoid    Mfr: [place]    Dose: 0.5 ml    Route: IM    Given by: Rica Records, RN    Exp. Date: 09/17/2010    Lot #: [id number]    Preventive Care Screening  Colonoscopy:    Date:  09/04/2008    Next Due:  09/2013    Results:  polyp   Last Flu Shot:    Date:  03/19/2009    Results:  Historical      Prevention & Chronic Care Immunizations   Influenza vaccine: Historical  (03/19/2009)   Influenza vaccine due: 02/05/2011    Tetanus booster: 07/06/2009: Td   Tetanus booster due: 07/07/2019    Pneumococcal vaccine: Pneumovax (Medicare)  (06/16/2008)   Pneumococcal vaccine due: 06/16/2013    H. zoster vaccine: Not documented  Colorectal Screening   Hemoccult: Not documented   Hemoccult action/deferral: Not indicated  (07/06/2009)    Colonoscopy: polyp  (09/04/2008)   Colonoscopy due: 09/2013  Other  Screening   PSA: 1.59  (07/03/2008)   PSA ordered.   PSA action/deferral: Discussed-PSA requested  (07/06/2009)   PSA due due: 04/02/2008   Smoking status: never  (07/06/2009)  Lipids   Total Cholesterol: 151  (06/16/2008)   Lipid panel action/deferral: Lipid Panel ordered   LDL: 93  (06/16/2008)   LDL Direct: 107.1  (12/05/2006)   HDL: 24.5  (06/16/2008)   Triglycerides: 169  (06/16/2008)    SGOT (AST): 43  (06/16/2008)   BMP action: Ordered   SGPT (ALT): 75  (06/16/2008)   Alkaline phosphatase: 43  (06/16/2008)   Total bilirubin: 0.9  (06/16/2008)    Lipid flowsheet reviewed?: Yes   Progress toward LDL goal: Unchanged  Hypertension   Last Blood Pressure: 134 / 78  (07/06/2009)   Serum creatinine: 1.1  (06/16/2008)   BMP action: Ordered   Serum potassium 4.6  (06/16/2008)    Hypertension flowsheet reviewed?: Yes   Progress toward BP goal: At goal  Self-Management Support :    Patient will work on the following items until the next clinic visit to reach self-care goals:     Medications and monitoring: take my medicines every day, check my blood pressure, weigh myself weekly  (07/06/2009)     Eating: drink diet soda or water instead of juice or soda  (07/06/2009)    Hypertension self-management support: Written self-care plan  (07/06/2009)   Hypertension self-care plan printed.    Lipid self-management support: Written self-care plan  (07/06/2009)   Lipid self-care plan printed.   Appended Document: pt will come in fasting/njr     Allergies: 1)  Niaspan (Niacin (Antihyperlipidemic))   Complete Medication List: 1)  Aspirin Ec 325 Mg Tbec (Aspirin) .... Once daily 2)  Lisinopril-hydrochlorothiazide 20-12.5 Mg Tabs (Lisinopril-hydrochlorothiazide) .... Take 2 tablet by mouth once a day 3)  Prilosec Otc 20 Mg Tbec (Omeprazole magnesium) .... One by mouth day 4)  Viagra 100 Mg Tabs (Sildenafil citrate) .... 1/2-1 by mouth once daily as needed. 5)  Fish Oil 1000  Mg Caps (Omega-3 fatty acids) .... Once daily 6)  Multivitamins Tabs (Multiple vitamin) .... Once daily 7)  Vitamin D 1000 Unit Caps (Cholecalciferol) .... Once daily 8)  Carvedilol 25 Mg Tabs (Carvedilol) .... One bid  Other Orders: Prescription Created Electronically [phone]) Prescriptions: CARVEDILOL 25 MG TABS (CARVEDILOL) one bid  #180 x 3   Entered and Authorized by:   Phoebe Sharps MD   Signed by:   Phoebe Sharps MD on 07/06/2009   Method used:   Electronically to        [facility] [phone]* (retail)       [address]       [place], [place]  [address]       Ph: [phone]  Fax: [phone]   [id number] PRILOSEC OTC 20 MG TBEC (OMEPRAZOLE MAGNESIUM) one by mouth day  #42 Tablet x 4   Entered and Authorized by:   Phoebe Sharps MD   Signed by:   Phoebe Sharps MD on 07/06/2009   Method used:   Electronically to        [facility] [phone][facility] (retail)       [address]       [place], [place]  [address]       Ph: [phone]       Fax: [phone]   RxID:   [id number] LISINOPRIL-HYDROCHLOROTHIAZIDE 20-12.5 MG TABS (LISINOPRIL-HYDROCHLOROTHIAZIDE) Take 2 tablet by mouth once a day  #180 Tablet x 5   Entered and Authorized by:   Phoebe Sharps MD   Signed by:   Phoebe Sharps MD on 07/06/2009   Method used:   Electronically to        [facility] [phone]* (retail)       [address]       [place],   [address]       Ph: [phone]       Fax: [phone]   RxID:   [id number]   Appended Document: pt will come in fasting/njr

## 2010-07-08 NOTE — Letter (Signed)
[facility]   Imported By: [name] 12/17/2008 08:03:50  _____________________________________________________________________  External Attachment:    Type:   Image     Comment:   External Document

## 2010-07-08 NOTE — Assessment & Plan Note (Signed)
Summary: IRREGULAR HEART BEAT/MHF   Vital Signs:  Patient Profile:   70 Years Old Male Weight:      236 pounds Temp:     98.2 degrees F oral Pulse rate:   86 / minute Pulse rhythm:   regular BP sitting:   152 / 80  Vitals Entered By: Deanna Artis CMA (March 27, 2007 4:11 PM)                 Chief Complaint:  episodes of sob.........stress test 2 months ago.  History of Present Illness: Complains of DOE, in addition he has palpitations and wife states that his heart rate is irregular. He notes that any physical activity causes dyspnea. No wheeze, no cough. He admits to being very sedentary and wonders whether his sxs are only related to deconditioning.   Current Allergies: No known allergies   Past Medical History:    Reviewed history from 12/07/2006 and no changes required:       GERD       Hyperlipidemia       Hypertension       thrombocytopenia       increased LFT's 2002 Hep B & C negative       increased glucose  Past Surgical History:    Reviewed history from 12/07/2006 and no changes required:       none as of 2000   Social History:    Married    Regular exercise-no   Risk Factors:  Exercise:  no   Review of Systems       no other complaints in a complete ROS    Physical Exam  General:     Well-developed,well-nourished,in no acute distress; alert,appropriate and cooperative throughout examination Head:     normocephalic and atraumatic.   Eyes:     pupils equal and pupils round.   Neck:     No deformities, masses, or tenderness noted. Chest Wall:     No deformities, masses, tenderness or gynecomastia noted. Lungs:     Normal respiratory effort, chest expands symmetrically. Lungs are clear to auscultation, no crackles or wheezes. Heart:     Normal rate and regular rhythm. S1 and S2 normal without gallop, murmur, click, rub or other extra sounds. Abdomen:     Bowel sounds positive,abdomen soft and non-tender without masses, organomegaly  or hernias noted. Msk:     No deformity or scoliosis noted of thoracic or lumbar spine.   Pulses:     R and L carotid,radial,femoral,dorsalis pedis and posterior tibial pulses are full and equal bilaterally Extremities:     No clubbing, cyanosis, edema, or deformity noted  Neurologic:     No cranial nerve deficits noted. Station and gait are normal.  Sensory, motor and coordinative functions appear intact. Skin:     Intact without suspicious lesions or rashes Psych:     Cognition and judgment appear intact. Alert and cooperative with normal attention span and concentration. No apparent delusions, illusions, hallucinations    Impression & Recommendations:  Problem # 1:  VENTRICULAR TACHYCARDIA (ICD-427.1) continue current medications but he needs further evaluation---see myoview.  His updated medication list for this problem includes:    Aspir-81 81 Mg Tbec (Aspirin) .Marland Kitchen... Take 1 tablet by mouth once a day  Orders: Cardiology Referral (Cardiology)   Problem # 2:  DYSPNEA/SHORTNESS OF BREATH (ICD-786.09) cv referrral His updated medication list for this problem includes:    Lisinopril-hydrochlorothiazide 20-12.5 Mg Tabs (Lisinopril-hydrochlorothiazide) .Marland Kitchen... Take 2 tablet by mouth  once a day  Orders: Cardiology Referral (Cardiology)   Problem # 3:  HYPERTENSION (ICD-401.9)  His updated medication list for this problem includes:    Lisinopril-hydrochlorothiazide 20-12.5 Mg Tabs (Lisinopril-hydrochlorothiazide) .Marland Kitchen... Take 2 tablet by mouth once a day  BP today: 152/80-he wil likely need more meds---advised diet, exercise.   Labs Reviewed: Creat: 1.1 (12/05/2006) Chol: 166 (12/05/2006)   HDL: 27.8 (12/05/2006)   LDL: DEL (12/05/2006)   TG: 242 (12/05/2006)   Complete Medication List: 1)  Aspir-81 81 Mg Tbec (Aspirin) .... Take 1 tablet by mouth once a day 2)  Lisinopril-hydrochlorothiazide 20-12.5 Mg Tabs (Lisinopril-hydrochlorothiazide) .... Take 2 tablet by mouth once a  day 3)  Prilosec Otc 20 Mg Tbec (Omeprazole magnesium) .... One by mouth day     ]

## 2010-07-08 NOTE — Progress Notes (Signed)
Summary: faxed ov note for 02/17/03 to [facility] Note Other Incoming   Call placed by: Tammy from [place] Call placed to: medical records Summary of Call: Need Dr Leanne Chang ov note for 02/17/03 on [place] faxed to [place] at [facility]. Initial call taken by: Reyne Dumas,  Oct 30, 2007 12:16 PM  Follow-up for Phone Call        Phone call completed,faxed ov note for  02/17/03 to Tammy at [facility]. Follow-up by: Reyne Dumas,  Oct 30, 2007 12:20 PM

## 2010-07-11 ENCOUNTER — Other Ambulatory Visit: Payer: Self-pay | Admitting: Internal Medicine

## 2010-07-11 DIAGNOSIS — E785 Hyperlipidemia, unspecified: Secondary | ICD-10-CM

## 2010-07-14 NOTE — Letter (Signed)
Summary: [facility]   Imported By: [name] 07/06/2010 12:44:28  _____________________________________________________________________  External Attachment:    Type:   Image     Comment:   External Document

## 2010-07-30 ENCOUNTER — Other Ambulatory Visit: Payer: Self-pay | Admitting: Internal Medicine

## 2010-08-01 ENCOUNTER — Encounter: Payer: Self-pay | Admitting: Internal Medicine

## 2010-08-05 ENCOUNTER — Ambulatory Visit (INDEPENDENT_AMBULATORY_CARE_PROVIDER_SITE_OTHER): Payer: Medicare Other | Admitting: Internal Medicine

## 2010-08-05 ENCOUNTER — Encounter: Payer: Self-pay | Admitting: Internal Medicine

## 2010-08-05 VITALS — BP 134/84 | HR 64 | Temp 97.8°F | Ht 71.0 in | Wt 239.0 lb

## 2010-08-05 DIAGNOSIS — R7989 Other specified abnormal findings of blood chemistry: Secondary | ICD-10-CM

## 2010-08-05 DIAGNOSIS — E785 Hyperlipidemia, unspecified: Secondary | ICD-10-CM

## 2010-08-05 DIAGNOSIS — R7401 Elevation of levels of liver transaminase levels: Secondary | ICD-10-CM

## 2010-08-05 DIAGNOSIS — I10 Essential (primary) hypertension: Secondary | ICD-10-CM

## 2010-08-05 DIAGNOSIS — I472 Ventricular tachycardia, unspecified: Secondary | ICD-10-CM

## 2010-08-05 DIAGNOSIS — R7402 Elevation of levels of lactic acid dehydrogenase (LDH): Secondary | ICD-10-CM

## 2010-08-05 DIAGNOSIS — R740 Nonspecific elevation of levels of transaminase and lactic acid dehydrogenase [LDH]: Secondary | ICD-10-CM

## 2010-08-05 DIAGNOSIS — Z125 Encounter for screening for malignant neoplasm of prostate: Secondary | ICD-10-CM

## 2010-08-05 DIAGNOSIS — E669 Obesity, unspecified: Secondary | ICD-10-CM

## 2010-08-05 DIAGNOSIS — Z0000 Encounter for general adult medical examination without abnormal findings: Secondary | ICD-10-CM

## 2010-08-05 DIAGNOSIS — R972 Elevated prostate specific antigen [PSA]: Secondary | ICD-10-CM

## 2010-08-05 DIAGNOSIS — Z131 Encounter for screening for diabetes mellitus: Secondary | ICD-10-CM

## 2010-08-05 DIAGNOSIS — K219 Gastro-esophageal reflux disease without esophagitis: Secondary | ICD-10-CM

## 2010-08-05 DIAGNOSIS — Z136 Encounter for screening for cardiovascular disorders: Secondary | ICD-10-CM

## 2010-08-05 LAB — CBC WITH DIFFERENTIAL/PLATELET
Basophils Absolute: 0 10*3/uL (ref 0.0–0.1)
Basophils Relative: 0.4 % (ref 0.0–3.0)
Eosinophils Absolute: 0.1 10*3/uL (ref 0.0–0.7)
Eosinophils Relative: 1.5 % (ref 0.0–5.0)
HCT: 38.2 % — ABNORMAL LOW (ref 39.0–52.0)
Hemoglobin: 13.2 g/dL (ref 13.0–17.0)
Lymphocytes Relative: 26.8 % (ref 12.0–46.0)
Lymphs Abs: 1.3 10*3/uL (ref 0.7–4.0)
MCHC: 34.6 g/dL (ref 30.0–36.0)
MCV: 92.9 fl (ref 78.0–100.0)
Monocytes Absolute: 0.5 10*3/uL (ref 0.1–1.0)
Monocytes Relative: 11.6 % (ref 3.0–12.0)
Neutro Abs: 2.8 10*3/uL (ref 1.4–7.7)
Neutrophils Relative %: 59.7 % (ref 43.0–77.0)
Platelets: 120 10*3/uL — ABNORMAL LOW (ref 150.0–400.0)
RBC: 4.11 Mil/uL — ABNORMAL LOW (ref 4.22–5.81)
RDW: 13.8 % (ref 11.5–14.6)
WBC: 4.7 10*3/uL (ref 4.5–10.5)

## 2010-08-05 LAB — HEPATIC FUNCTION PANEL
ALT: 33 U/L (ref 0–53)
AST: 22 U/L (ref 0–37)
Albumin: 4.4 g/dL (ref 3.5–5.2)
Alkaline Phosphatase: 37 U/L — ABNORMAL LOW (ref 39–117)
Bilirubin, Direct: 0.1 mg/dL (ref 0.0–0.3)
Total Bilirubin: 0.8 mg/dL (ref 0.3–1.2)
Total Protein: 7.1 g/dL (ref 6.0–8.3)

## 2010-08-05 LAB — LIPID PANEL
Cholesterol: 162 mg/dL (ref 0–200)
HDL: 33.1 mg/dL — ABNORMAL LOW (ref >39.00)
Total CHOL/HDL Ratio: 5
Triglycerides: 207 mg/dL — ABNORMAL HIGH (ref 0.0–149.0)
VLDL: 41.4 mg/dL — ABNORMAL HIGH (ref 0.0–40.0)

## 2010-08-05 LAB — HEMOGLOBIN A1C: Hgb A1c MFr Bld: 6.4 % (ref 4.6–6.5)

## 2010-08-05 LAB — BASIC METABOLIC PANEL
BUN: 36 mg/dL — ABNORMAL HIGH (ref 6–23)
CO2: 26 mEq/L (ref 19–32)
Calcium: 9.4 mg/dL (ref 8.4–10.5)
Chloride: 103 mEq/L (ref 96–112)
Creatinine, Ser: 1.6 mg/dL — ABNORMAL HIGH (ref 0.4–1.5)
GFR: 46.95 mL/min — ABNORMAL LOW (ref >60.00)
Glucose, Bld: 108 mg/dL — ABNORMAL HIGH (ref 70–99)
Potassium: 4.3 mEq/L (ref 3.5–5.1)
Sodium: 139 mEq/L (ref 135–145)

## 2010-08-05 LAB — PSA: PSA: 0.96 ng/mL (ref 0.10–4.00)

## 2010-08-05 LAB — LDL CHOLESTEROL, DIRECT: Direct LDL: 102.4 mg/dL

## 2010-08-05 LAB — TSH: TSH: 1.59 u[IU]/mL (ref 0.35–5.50)

## 2010-08-05 NOTE — Progress Notes (Signed)
Subjective:    Patient ID: Martin Cordova, male    DOB: [birth year], 70 y.o.   MRN: [id number]  HPI Medicare wellness visit  Past Medical History  Diagnosis Date  . GERD 12/07/2006  . HYPERGLYCEMIA 12/07/2006  . HYPERLIPIDEMIA 12/07/2006  . HYPERTENSION 12/07/2006  . OBESITY 11/10/2009  . SLEEP APNEA 10/22/2007  . THROMBOCYTOPENIA 12/07/2006  . TRANSAMINASES, SERUM, ELEVATED 12/07/2006  . VENTRICULAR TACHYCARDIA 03/27/2007   Past Surgical History  Procedure Date  . Cholecystectomy     reports that he quit smoking about 30 years ago. He does not have any smokeless tobacco history on file. His alcohol and drug histories not on file. family history includes Diabetes in his father and mother; Heart failure in his mother; and Kidney failure in his father.    No Known Allergies  Review of Systems  patient denies chest pain, shortness of breath, orthopnea. Denies lower extremity edema, abdominal pain, change in appetite, change in bowel movements. Patient denies rashes, musculoskeletal complaints. No other specific complaints in a complete review of systems.      Objective:   Physical Exam Well-developed male in no acute distress. HEENT exam atraumatic, normocephalic, extraocular muscles are intact. Conjunctivae are pink without exudate. Neck is supple without lymphadenopathy, thyromegaly, jugular venous distention. Chest is clear to auscultation without increased work of breathing. Cardiac exam S1-S2 are regular. The PMI is normal. No significant murmurs or gallops. Abdominal exam active bowel sounds, soft, nontender. No abdominal bruits. Extremities no clubbing cyanosis or edema. Peripheral pulses are normal without bruits. Neurologic exam alert and oriented without any motor or sensory deficits. Rectal exam normal tone prostate normal size without masses or asymmetry.        Assessment & Plan:    Subjective:    Martin Cordova is a 70 y.o. male who presents for a welcome to Medicare exam.    Cardiac risk factors: advanced age (older than 5 for men, 38 for women), dyslipidemia and hypertension.  Depression Screen (Note: if answer to either of the following is "Yes", a more complete depression screening is indicated)  Q1: Over the past two weeks, have you felt down, depressed or hopeless? no Q2: Over the past two weeks, have you felt little interest or pleasure in doing things? no  Activities of Daily Living In your present state of health, do you have any difficulty performing the following activities?:  Preparing food and eating?: No Bathing yourself: No Getting dressed: No Using the toilet:No Moving around from place to place: No In the past year have you fallen or had a near fall?:No  Current exercise habits: The patient does not participate in regular exercise at present.  Dietary issues discussed: pt understands need for weight loss, low calorie diet  Hearing difficulties: No Safe in current home environment: yes  Review of Systems   Objective:     Vision by Snellen chart: right eye:20/20, left eye:20/20 Blood pressure 134/84, pulse 64, temperature 97.8 F (36.6 C), temperature source Oral, height 5\' 11"  (1.803 m), weight 239 lb (108.41 kg). Body mass index is 33.33 kg/(m^2). Well-developed male in no acute distress. HEENT exam atraumatic, normocephalic, extraocular muscles are intact. Conjunctivae are pink without exudate. Neck is supple without lymphadenopathy, thyromegaly, jugular venous distention. Chest is clear to auscultation without increased work of breathing. Cardiac exam S1-S2 are regular. The PMI is normal. No significant murmurs or gallops. Abdominal exam active bowel sounds, soft, nontender. No abdominal bruits. Extremities no clubbing cyanosis or edema. Peripheral  pulses are normal without bruits. Neurologic exam alert and oriented without any motor or sensory deficits. Rectal exam normal tone prostate normal size without masses or  asymmetry.  Assessment:  Medicare wellness visit       Plan:     During the course of the visit the patient was educated and counseled about appropriate screening and preventive services including:   Pneumococcal vaccine   Influenza vaccine  Td vaccine  Prostate cancer screening  Colorectal cancer screening  Diabetes screening  Patient Instructions (the written plan) was given to the patient.

## 2010-08-05 NOTE — Assessment & Plan Note (Signed)
Needs weight loss Check labs.

## 2010-08-05 NOTE — Assessment & Plan Note (Signed)
Discussed need for weight loss. Encouraged daily exercise.

## 2010-08-05 NOTE — Assessment & Plan Note (Signed)
Check labs today.

## 2010-08-05 NOTE — Assessment & Plan Note (Signed)
Needs labs. Check tocday

## 2010-08-05 NOTE — Assessment & Plan Note (Signed)
Controlled on current medications. Continue same. Check labs today.

## 2010-08-05 NOTE — Assessment & Plan Note (Signed)
Adequate control. Continue current medications.

## 2010-09-15 LAB — GLUCOSE, CAPILLARY: Glucose-Capillary: 119 mg/dL — ABNORMAL HIGH (ref 70–99)

## 2010-09-15 LAB — URINALYSIS, ROUTINE W REFLEX MICROSCOPIC
Bilirubin Urine: NEGATIVE
Glucose, UA: NEGATIVE mg/dL
Hgb urine dipstick: NEGATIVE
Ketones, ur: NEGATIVE mg/dL
Nitrite: NEGATIVE
Protein, ur: NEGATIVE mg/dL
Specific Gravity, Urine: 1.02 (ref 1.005–1.030)
Urobilinogen, UA: 0.2 mg/dL (ref 0.0–1.0)
pH: 5.5 (ref 5.0–8.0)

## 2010-09-15 LAB — URINALYSIS, MICROSCOPIC ONLY
Bilirubin Urine: NEGATIVE
Glucose, UA: NEGATIVE mg/dL
Hgb urine dipstick: NEGATIVE
Ketones, ur: 15 mg/dL — AB
Leukocytes, UA: NEGATIVE
Nitrite: NEGATIVE
Protein, ur: 30 mg/dL — AB
Specific Gravity, Urine: 1.029 (ref 1.005–1.030)
Urobilinogen, UA: 0.2 mg/dL (ref 0.0–1.0)
pH: 5.5 (ref 5.0–8.0)

## 2010-09-15 LAB — COMPREHENSIVE METABOLIC PANEL
ALT: 50 U/L (ref 0–53)
AST: 34 U/L (ref 0–37)
Albumin: 3.9 g/dL (ref 3.5–5.2)
Alkaline Phosphatase: 38 U/L — ABNORMAL LOW (ref 39–117)
BUN: 21 mg/dL (ref 6–23)
CO2: 28 mEq/L (ref 19–32)
Calcium: 9.5 mg/dL (ref 8.4–10.5)
Chloride: 103 mEq/L (ref 96–112)
Creatinine, Ser: 1.2 mg/dL (ref 0.4–1.5)
GFR calc Af Amer: >60 mL/min (ref >60)
GFR calc non Af Amer: >60 mL/min (ref >60)
Glucose, Bld: 111 mg/dL — ABNORMAL HIGH (ref 70–99)
Potassium: 3.9 mEq/L (ref 3.5–5.1)
Sodium: 140 mEq/L (ref 135–145)
Total Bilirubin: 1.1 mg/dL (ref 0.3–1.2)
Total Protein: 6.7 g/dL (ref 6.0–8.3)

## 2010-09-15 LAB — DIFFERENTIAL
Basophils Absolute: 0 10*3/uL (ref 0.0–0.1)
Basophils Relative: 0 % (ref 0–1)
Eosinophils Absolute: 0 10*3/uL (ref 0.0–0.7)
Eosinophils Relative: 0 % (ref 0–5)
Lymphocytes Relative: 7 % — ABNORMAL LOW (ref 12–46)
Lymphs Abs: 0.7 10*3/uL (ref 0.7–4.0)
Monocytes Absolute: 0.5 10*3/uL (ref 0.1–1.0)
Monocytes Relative: 5 % (ref 3–12)
Neutro Abs: 9.6 10*3/uL — ABNORMAL HIGH (ref 1.7–7.7)
Neutrophils Relative %: 89 % — ABNORMAL HIGH (ref 43–77)

## 2010-09-15 LAB — PROTIME-INR
INR: 1 (ref 0.00–1.49)
Prothrombin Time: 13.6 seconds (ref 11.6–15.2)

## 2010-09-15 LAB — CBC
HCT: 34.8 % — ABNORMAL LOW (ref 39.0–52.0)
HCT: 43.4 % (ref 39.0–52.0)
Hemoglobin: 11.7 g/dL — ABNORMAL LOW (ref 13.0–17.0)
Hemoglobin: 14.8 g/dL (ref 13.0–17.0)
MCHC: 33.7 g/dL (ref 30.0–36.0)
MCHC: 34 g/dL (ref 30.0–36.0)
MCV: 91.4 fL (ref 78.0–100.0)
MCV: 92.7 fL (ref 78.0–100.0)
Platelets: 118 10*3/uL — ABNORMAL LOW (ref 150–400)
Platelets: 84 10*3/uL — ABNORMAL LOW (ref 150–400)
RBC: 3.76 MIL/uL — ABNORMAL LOW (ref 4.22–5.81)
RBC: 4.75 MIL/uL (ref 4.22–5.81)
RDW: 13.7 % (ref 11.5–15.5)
RDW: 14.6 % (ref 11.5–15.5)
WBC: 10.8 10*3/uL — ABNORMAL HIGH (ref 4.0–10.5)
WBC: 8.7 10*3/uL (ref 4.0–10.5)

## 2010-09-15 LAB — LIPASE, BLOOD: Lipase: 22 U/L (ref 11–59)

## 2010-09-15 LAB — HEPATIC FUNCTION PANEL
ALT: 73 U/L — ABNORMAL HIGH (ref 0–53)
AST: 58 U/L — ABNORMAL HIGH (ref 0–37)
Albumin: 3.1 g/dL — ABNORMAL LOW (ref 3.5–5.2)
Alkaline Phosphatase: 38 U/L — ABNORMAL LOW (ref 39–117)
Bilirubin, Direct: 0.4 mg/dL — ABNORMAL HIGH (ref 0.0–0.3)
Indirect Bilirubin: 1.1 mg/dL — ABNORMAL HIGH (ref 0.3–0.9)
Total Bilirubin: 1.5 mg/dL — ABNORMAL HIGH (ref 0.3–1.2)
Total Protein: 6 g/dL (ref 6.0–8.3)

## 2010-09-15 LAB — CREATININE, SERUM
Creatinine, Ser: 1.82 mg/dL — ABNORMAL HIGH (ref 0.4–1.5)
GFR calc Af Amer: 45 mL/min — ABNORMAL LOW (ref >60)
GFR calc non Af Amer: 37 mL/min — ABNORMAL LOW (ref >60)

## 2010-09-15 LAB — POTASSIUM: Potassium: 4.1 mEq/L (ref 3.5–5.1)

## 2010-09-15 LAB — APTT: aPTT: 22 seconds — ABNORMAL LOW (ref 24–37)

## 2010-10-19 NOTE — Assessment & Plan Note (Signed)
[facility]                            CARDIOLOGY OFFICE NOTE   Martin Cordova, Martin Cordova                     MRN:          [id number]  DATE:04/24/2007                            DOB:          Mar 11, 1941    PRIMARY CARE PHYSICIAN:  Martin Cordova.   REASON FOR VISIT:  Followup cardiac catheterization.   HISTORY OF PRESENT ILLNESS:  I saw Martin Cordova back in late October.  He was referred at that time with a history of palpitations, dyspnea on  exertion and abnormal Myoview, specifically abnormal ST segment changes  with a 6-beat run of nonsustained ventricular tachycardia at peak  exercise, although with normal perfusion imaging and normal ejection  fraction.  Given this, I referred him for a diagnostic cardiac  catheterization to clearly assess the coronary anatomy.  This was  performed by Martin Cordova on the 31st of October and revealed normal  coronary arteries with normal ejection fraction.  I had also recommended  starting a trial of Toprol-XL 25 mg daily and he states that since being  on this he has had no further symptoms.  He was very reassured by his  catheterization results and we discussed this again today.  I would  generally recommend observation and general risk factor modification.  We talked about exercise and diet as well.   ALLERGIES:  NIASPAN.   PRESENT MEDICATIONS:  1. Multivitamin one p.o. daily.  2. Toprol-XL 25 mg p.o. daily.  3. Aspirin 81 mg p.o. daily.  4. Prilosec 20 mg p.o. daily.  5. Lisinopril HCT 20/12.5 mg two tablets p.o. daily.   REVIEW OF SYSTEMS:  As described in the History of Present Illness.   EXAMINATION:  Blood pressure is 148/87, heart rate is 63, weight is 240  pounds.  The patient is comfortable, in no acute distress.  Otherwise,  no significant change in examination as documented previously.   IMPRESSION AND RECOMMENDATION:  1. Normal coronary arteries by recent cardiac catheterization with  overall normal ejection fraction.  This is quite reassuring.  Based      on this, would recommend general risk factor modification.  I think      continuing on low-dose beta-blocker is reasonable, particularly      since this has improved the patient's sense of palpitations.  He      will stay on an aspirin as well and we have talked about a basic      exercise regimen.  He will otherwise continue regular      followup with Martin Cordova and, in fact, is due for a routine visit      later this week.  2. Cardiology followup can be as needed.     Martin Sark, MD  Electronically Signed    SGM/MedQ  DD: 04/24/2007  DT: 04/25/2007  Job #: [id number]   cc:   Martin Penna. Swords, MD

## 2010-10-19 NOTE — Cardiovascular Report (Signed)
NAME:  Martin Cordova, Martin Cordova              ACCOUNT NO.:  [id number]   MEDICAL RECORD NO.:  [id number]          PATIENT TYPE:  OIB   LOCATION:  1961                         FACILITY:  [facility]   PHYSICIAN:  Vanna Scotland. Olevia Perches, MD, FACCDATE OF BIRTH:  1941/04/09   DATE OF PROCEDURE:  04/06/2007  DATE OF DISCHARGE:                            CARDIAC CATHETERIZATION   HISTORY:  Martin Cordova is a 70 year old and has no prior history of  heart disease.  He does have hypertension.  He recently developed  symptoms of palpitations and had a stress test ordered by Dr. Leanne Chang  which showed no perfusion defects but did show some borderline ST  changes and short run of ventricular tachycardia.  He was seen in  consultation with Dr. Domenic Polite and Dr. Domenic Polite recommended evaluation  with angiography.   PROCEDURE:  The procedure was performed via the right femoral artery  using arterial sheath and 4-French preformed coronary catheters.  A  front wall arterial puncture was performed, and Omnipaque contrast was  used.  A distal aortogram was performed without renovascular causes for  hypertension.  The patient tolerated the procedure well and left the  laboratory in satisfactory condition.   RESULTS:  Aortic pressure was 122/72 with a mean of 94, and left  ventricle pressure was 122/13.   Left main coronary was free of significant disease.   Left anterior descending artery gave rise to two diagonal branches and  two septal perforators.  There was slight irregularity in the LAD but no  significant obstruction.   The circumflex artery gave rise to a large ramus branch and a large  posterolateral branch.  These vessels were free of significant disease.   The right coronary artery was a moderately large vessel that gave rise  to conus branch, two right ventricular branches, a posterior descending  branch and two posterolateral branches.  These vessels were free of  significant disease.   Left ventriculogram  performed in the RAO projection showed slight  hypokinesis of the mid inferior wall.  The overall wall motion was quite  good.  The estimated fraction was 55%.   Distal aortogram was performed which showed no abdominal aneurysm.  The  right renal artery was patent, and the left renal artery was difficult  to visualize for technical reasons but appeared to be probably free of  obstruction.   CONCLUSION:  Normal coronary angiography and normal left ventricular  function.   RECOMMENDATIONS:  Reassurance.  There is a minimal wall motion  abnormality in the inferior wall and not sure if this is related to the  short run of ventricular tachycardia with stress.  Dr. Domenic Polite  suggested adding a beta-blocker if his coronary angiogram looked good  and will try him on low-dose Toprol XL 25 mg a day and will follow up  with Dr. Domenic Polite in 2 weeks.      Bruce Alfonso Patten Olevia Perches, MD, [facility]  Electronically Signed     BRB/MEDQ  D:  04/06/2007  T:  04/06/2007  Job:  [id number]   cc:   Darrick Penna. Swords, MD  Satira Sark, MD  Bruce Alfonso Patten Olevia Perches, MD, [facility]  Cardiopulmonary Lab

## 2010-10-19 NOTE — Discharge Summary (Signed)
NAME:  Martin Cordova, Martin Cordova              ACCOUNT NO.:  [id number]   MEDICAL RECORD NO.:  [id number]          PATIENT TYPE:  INP   LOCATION:  6705                         FACILITY:  [facility]   PHYSICIAN:  Adin Hector, MD     DATE OF BIRTH:  July 17, 1940   DATE OF ADMISSION:  09/23/2008  DATE OF DISCHARGE:  09/26/2008                               DISCHARGE SUMMARY   ADMITTING GENERAL SURGEON:  Sammuel Hines. Daiva Nakayama, MD   OPERATIVE SURGEON:  Adin Hector, MD   DISCHARGE DIAGNOSES:  1. Acute cholecystitis with empyema of the gallbladder.  2. Hypertension.  3. Chronic obstructive pulmonary disease with history of obstructive      sleep apnea on CPAP.   PROCEDURES:  Lap cholecystectomy with intraoperative cholangiogram and  liver biopsy x3 per Dr. Michael Boston on September 24, 2008.   HISTORY:  This is a 70 year old white male who presented to the ED with  right upper quadrant pain that started the day prior to admission.  The  pain was persistent and severe.  He underwent a right upper quadrant  ultrasound, which showed gallstones with one possibly impacted at the  neck.  No ductal dilatation was appreciated.  HIDA scan showed an  obstructed cystic duct.  The patient was started on antibiotics and  taken to the OR for lap cholecystectomy with intraoperative  cholangiogram and liver biopsy x3 per Dr. Johney Maine.  Findings at the time  of surgery included acute cholecystitis with empyema of the gallbladder.  The patient did well postoperatively and was maintained on intravenous  antibiotics initially in the postoperative period.  He is now being  switched over to p.o. Augmentin.  He was tolerating a regular diet and  ambulatory.  He did have some mild urinary symptoms with some burning on  urination and an urinalysis was obtained and showed some rare bacteria,  but on the morning following the patient's symptoms, his urinary  symptoms had essentially resolved.   At this time, the patient is prepared  for discharge.  She is in stable  and improved condition.   MEDICATIONS AT THE TIME OF DISCHARGE:  1. Percocet 5/325 mg 1-2 p.o. q.4 h. p.r.n. pain #40, no refill.  2. Augmentin 875 mg 1 tablet b.i.d. x5 more days for a total of 7 days      of antibiotics.   He will continue on his usual home medications of  lisinopril/hydrochlorothiazide 20/12.5 mg 2 tablets p.o. daily, aspirin  325 mg 1 daily, Prilosec 20 mg p.o. daily, multivitamin p.o. daily,  vitamin D 2 tablets p.o. daily, fish oil daily, and carvedilol 25 mg  p.o. b.i.d.   DIET:  Regular.   He can return to work in 1-2 weeks.   Do not resume driving until off of Percocet.   He will follow up in the [facility] on Oct 07, 2008, at 3 p.m. or sooner  should he have any difficulties in the interim.      Shawn Rayburn, P.A.      Adin Hector, MD  Electronically Signed    SR/MEDQ  D:  09/26/2008  T:  09/26/2008  Job:  [id number]   cc:   [facility]

## 2010-10-19 NOTE — H&P (Signed)
NAME:  KALA, GRATZ              ACCOUNT NO.:  [id number]   MEDICAL RECORD NO.:  [id number]          PATIENT TYPE:  INP   LOCATION:  6705                         FACILITY:  [facility]   PHYSICIAN:  Sammuel Hines. Daiva Nakayama, M.D. DATE OF BIRTH:  1940/12/01   DATE OF ADMISSION:  09/23/2008  DATE OF DISCHARGE:                              HISTORY & PHYSICAL   Mr. Steig is a 70 year old white male who presents to emergency  department with right upper quadrant pain that started yesterday.  Pain  is not going away.  The pain has been persistent and severe at times.  He denies any nausea or vomiting.  No fevers or chills.  No chest pain  or shortness of breath.  No diarrhea or dysuria.  The rest of his review  of systems is unremarkable.   PAST MEDICAL HISTORY:  Significant for hypertension and sleep apnea.   PAST SURGICAL HISTORY:  None.   MEDICATIONS:  Carvedilol, lisinopril, hydrochlorothiazide, aspirin, and  Prilosec.   ALLERGIES:  No known drug allergies.   SOCIAL HISTORY:  He denies use of tobacco or tobacco products.   FAMILY HISTORY:  Noncontributory.   PHYSICAL EXAMINATION:  VITAL SIGNS:  His temperature is 98.5, pulse of  80, blood pressure 129/68.  GENERAL:  He is a well-developed, well-nourished white male in no acute  distress.  SKIN:  Warm and dry.  No jaundice.  HEENT:  Eyes - extraocular muscles are intact.  Pupils are equal, round,  and reactive to light.  Sclerae nonicteric.  LUNGS:  Clear bilaterally with no use of accessory respiratory muscles.  HEART:  Regular rate and rhythm with impulse in the left chest.  ABDOMEN:  Soft with some mild right upper quadrant tenderness, but no  palpable mass.  No peritonitis.  EXTREMITIES:  No cyanosis, clubbing, or edema.  Good strength in his  arms and legs.  PSYCHOLOGIC:  He is alert and oriented x3 with no evidence of anxiety or  depression.   On review of his lab work, it is significant for a white count of 10.8.  LFTs were  normal.  Ultrasound showed gallstones, 1 possibly impacted at  the neck, no gallbladder wall thickening, no ductal dilatation.  The  HIDA scan showed an obstructed cystic duct.   ASSESSMENT AND PLAN:  This is a 70-year white male with gallstones and a  possible obstructed cystic duct.  We plan to admit him to [facility] and plan for surgery tomorrow.  I have discussed in  detail the risks and benefits of the operation as well as some of the  technical aspects and he understands and wished to proceed.      Sammuel Hines. Daiva Nakayama, M.D.  Electronically Signed     PST/MEDQ  D:  09/23/2008  T:  09/24/2008  Job:  [id number]

## 2010-10-19 NOTE — Assessment & Plan Note (Signed)
[facility]                            CARDIOLOGY OFFICE NOTE   Martin Cordova, Martin Cordova                     MRN:          [id number]  DATE:07/26/2007                            DOB:          [birth year]    PRIMARY CARE PHYSICIAN:  Dr. Phoebe Sharps.   REASON FOR VISIT:  Follow-up palpitations.   HISTORY OF PRESENT ILLNESS:  Mr. Phimmasone called in to the office  stating that he had occasional sense of a strong heartbeat followed by  a skip.  This has happened a total of three times in a limited fashion  since I last saw him in November, typically in the evening, and  sometimes after he drinks beer at night.  He otherwise has no sense of  prolonged palpitations and has had no dizziness or syncope.  His  electrocardiogram today is normal and he is in sinus rhythm.  I elected  to keep him on a low-dose beta blocker at the last visit and today we  talked about taking an additional one-half Toprol XL 25 mg tablet in the  evening if he feels these symptoms, which I suspect very likely are  isolated PVCs based on his description.  He was very comfortable with  this.   ALLERGIES:  NIASPAN.   MEDICATIONS:  Multivitamin daily, lisinopril 20/12.5 mg, Prilosec 20 mg,  metoprolol XL 25 mg, aspirin 81 mg p.o. daily.   REVIEW OF SYSTEMS:  As history of present illness.  Otherwise, negative.   EXAMINATION:  Blood pressure 134/90 heart rate is 63, weight 243 pounds.  Is an obese male no acute distress.  HEENT:  Conjunctiva is normal.  Pharynx is clear.  Neck is supple.  No elevated is pressure loud bruits.  Lungs are clear without labored breathing.  Heart exam with a regular rate and rhythm.  No S3 gallop or loud murmur.  EXTREMITIES:  No pitting edema.   IMPRESSION:  Occasional premature ventricular complexes based on the  patient description.  This is not associated with any dizziness or  syncope.  The patient has previously documented normal coronary  arteries  and normal ejection fraction.  I asked Mr. Rosasco to avoid drinking  alcohol in the evening near bedtime and also avoid caffeine.  I  explained he might consider taking a one-half dose metoprolol tablet in  the evening p.r.n. if he has palpitations.  We talked  about an event monitor if he manifests progressive symptoms, but I did  not think this was crucial at this time.  He is very comfortable with  this.  Our follow-up can be p.r.n.     Satira Sark, MD  Electronically Signed    SGM/MedQ  DD: 07/26/2007  DT: 07/26/2007  Job #: [id number]   cc:   Darrick Penna. Swords, MD

## 2010-10-19 NOTE — Assessment & Plan Note (Signed)
[facility] OFFICE NOTE   CARVER, GEORGIA                     MRN:          [id number]  DATE:04/02/2007                            DOB:          April 10, 1941    PRIMARY CARE PHYSICIAN:  Darrick Penna. Swords, M.D.   REASON FOR VISIT:  Palpitations, dyspnea on exertion, and abnormal  Myoview.   HISTORY OF PRESENT ILLNESS:  The patient is a pleasant 70 year old male  with a history of hypertension and gastroesophageal reflux disease.  He  reports a 4-36-month history of intermittent palpitations described as  heart skips that seem to be most notable at nighttime.  He describes a  feeling that his heart is pounding or beating very hard as well.  He has  NYHA class II dyspnea on exertion, but does note that this seems to be  more of an issue over the last six months.  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  He was referred for a Myoview  by Dr. Leanne Chang that was performed back in late July of 2008.  This study  reported mildly abnormal electrocardiographic changes with 1 mm ST  segment depression in the inferior leads as well as V4 through V6, as  well as a six-beat run of nonsustained ventricular tachycardia at peak  exercise.  Despite this perfusion imaging and left ventricular systolic  function was normal.  The patient was managed conservatively.  He is  referred today to discuss the situation further as he continues to have  symptoms of palpitations.  His follow-up electrocardiogram shows normal  sinus rhythm at 70 beats per minute.  I had a fairly long discussion  with the patient and ultimately his wife, discussing the implications of  his stress test which are at this point fairly equivocal.  Given his  feeling of palpitations and dyspnea on exertion as well as the six-beat  run of ventricular tachycardia, I  think it makes sense to proceed on to  a diagnostic cardiac catheterization to clearly exclude any underlying  obstructive coronary artery disease.  In the absence of this, I expect  beta blocker therapy would be a reasonable option.   ALLERGIES:  NIASPAN.   CURRENT MEDICATIONS:  1. Multivitamin daily.  2. Lisinopril 20/12.5 mg two tablets p.o. daily.  3. Prilosec 20 mg p.o. daily.   PAST MEDICAL HISTORY:  As outlined above.  The patient denies any major  surgeries.  No known cardiovascular disease or myocardial infarction.   SOCIAL HISTORY:  The patient is married and he has four children.  He is  retired and works part-time at [employer] in [place].  He previously  worked as an Water quality scientist at [employer], but retired in  1998.  He quit smoking in 1981.  He drinks one beer a day.  He is not  exercising regularly.   FAMILY HISTORY:  Reviewed.  The patient states that his mother died at  age 43 with congestive  heart failure.   REVIEW OF SYSTEMS:  As described in history of present illness.  He has  had some symptoms of depression in the past.  Otherwise negative.   PHYSICAL EXAMINATION:  VITAL SIGNS:  Blood pressure 150/80, heart rate  70, weight is 236 pounds.  GENERAL:  This is an overweight male in no acute distress.  HEENT:  Conjunctivae and lids normal.  Oropharynx clear.  NECK:  Supple.  No elevated jugular venous pressure.  No loud bruits.  No thyromegaly.  LUNGS:  Clear without labored breathing at rest.  HEART:  Regular rate and rhythm.  No loud murmur or S3 gallop.  ABDOMEN:  Soft and nontender.  Normal active bowel sounds.  EXTREMITIES:  Exhibit no pitting edema.  Distal pulses 2+.  SKIN:  Warm and dry.  MUSCULOSKELETAL:  No kyphosis is noted.  NEUROPSYCHIATRIC:  The patient is alert and oriented x3.  Affect is  normal.   IMPRESSION:  1. Intermittent palpitations as well as a general feeling of      progressive dyspnea on exertion.  The patient  had a Myoview in July      which was somewhat equivocal with abnormal echocardiographic      changes and a six-beat run of nonsustained ventricular tachycardia      at peak, although no perfusion evidence of ischemia and overall      normal ejection fraction.  As he has had a persistence of his      symptoms, I think it is reasonable to proceed on to a diagnostic      cardiac catheterization to clearly exclude any underlying      obstructive coronary artery disease.  I reviewed the risks and      benefits of this with the patient and he is in agreement to      proceed.  I will add Toprol XL 25 mg daily to his regimen and we      can determine the next step based on his coronary anatomy.  Some of      this may simply be deconditioning as he has not been exercising and      perhaps an increase in his regimen with concurrent beta blocker      therapy may be our plan of course with otherwise risk factor      modification.  We will obtain baseline labs and proceed from there.  2. Further plans to follow.     Satira Sark, MD  Electronically Signed    SGM/MedQ  DD: 04/02/2007  DT: 04/03/2007  Job #: [id number]   cc:   Darrick Penna. Swords, MD

## 2010-10-19 NOTE — Op Note (Signed)
NAME:  Martin Cordova, Martin Cordova              ACCOUNT NO.:  [id number]   MEDICAL RECORD NO.:  [id number]          PATIENT TYPE:  INP   LOCATION:  6705                         FACILITY:  [facility]   PHYSICIAN:  Adin Hector, MD     DATE OF BIRTH:  [birth year]   DATE OF PROCEDURE:  09/23/2008  DATE OF DISCHARGE:  09/23/2008                               OPERATIVE REPORT   PRIMARY CARE PHYSICIAN:  Mayme Genta, MD   SURGEON:  Adin Hector, MD   ASSISTANT:  Henreitta Cea, PA   PREOPERATIVE DIAGNOSIS:  Biliary colic, probable cholecystitis.  Transaminitis, question of steatohepatitis.   POSTOPERATIVE DIAGNOSES:  1. Acute cholecystitis.  2. Cholecystolithiasis.  3. Empyema of the gallbladder.  4. Transaminitis, question of steatohepatitis.   PROCEDURES PERFORMED:  1. Laparoscopic cholecystectomy with an intraoperative cholangiogram.  2. Laparoscopic lysis of adhesions x30 minutes.  3. Laparoscopic-assisted 14G Tru-Cut core liver biopsy x3.   ANESTHESIA:  1. General anesthesia.  2. Local anesthetic and a field block around all port sites.   SPECIMENS:  1. Gallbladder.  2. Core liver biopsy x3.   DRAINS:  None.   ESTIMATED BLOOD LOSS:  30 mL.   COMPLICATIONS:  None.   INDICATIONS:  [name] is a pleasant 70 year old morbidly obese male  with coronary artery disease and hypertension who has been followed by  Dr. Earlean Shawl for some mild transaminitis.  He came in with severe  abdominal pain and right upper quadrant pain.  An ultrasound that showed  some stones, but no definite gallbladder wall thickening.  He had a HIDA  scan that showed cystic duct obstruction.   Based on concerns, surgical consultation was requested and the patient  was admitted.  He was placed on IV antibiotics.  The anatomy and  physiology of hepatobiliary and pancreatic function was discussed.  Pathophysiology of cholecystolithiasis with risk of cholecystitis and  natural history and risks were  discussed.  Options were discussed and  recommendation was made for laparoscopic cholecystectomy with  intraoperative cholangiogram.  Risks, benefits, and alternatives were  discussed.  Questions were answered and he and his wife agreed to  proceed.   OPERATIVE FINDINGS:  He had significant gallbladder wall thickening and  dense adhesions of his mesocolon, transverse colon, epiploic appendages,  and duodenum, strongly suggestive of cholecystitis.  He had empyema of  the gallbladder as well.   He did have what looks fatty steatohepatitis, but no frank cirrhosis.   DESCRIPTION OF PROCEDURE:  Informed consent was confirmed.  The patient  underwent general anesthesia without any difficulty.  He received IV  antibiotics.  He had sequential compression devices active during the  entire case.  He was positioned supine and both arms tucked.  He had a  Foley catheter sterilely placed.  His abdomen was clipped, prepped and  draped in a sterile fashion.  Surgical time-out confirmed our plan.   A #5-mm port was placed in the right upper quadrant using optical entry  technique with the patient in steep reverse Trendelenburg and right side  up.  A camera inspection revealed no intraabdominal  injury.  Under  direct visualization, 5-mm ports x2 were placed in the right flank.  Another one was placed through the umbilicus.  A 10-mm port was tunneled  through the falciform ligament in the subxiphoid region.   Camera inspection revealed a dense inflammatory wad on the liver edge.  After some careful sweeping and dissection, I was able to see a good  candidate for dome of the gallbladder.  The transverse colon and  mesocolon was swept off carefully off the gallbladder using focused  blunt and sharp dissection with occasional focused cautery.  Eventually,  I was able to free off the attachments to the anterior wall of the  gallbladder.  The duodenum was adherent, but not too densely so.  I was  able to  free that off using just some careful sharp dissection and  focused hydrodissection with a sucker.  I did not use any cautery.   With this and confirmation that the gallbladder was involved, I went  ahead and needle aspirated some purulent bile consistent with empyema of  the gallbladder, but his helped decompress the gallbladder wall.  The  gallbladder was grasped and elevated cephalad.   Peritoneal coverings between the gallbladder and the liver were freed  off additionally on the posterolateral wall coming around towards the  infundibulum.  Dissection was done to isolate a good branch for this  posterior cystic artery and this was carefully skeletonized and  controlled with cautery since it was rather thin and wispy.   Attention was turned towards the anterior and medial wall of the  gallbladder and cautery was freed off from the dome.  Cautery was used  from the dome of the gallbladder coming down more towards the  infundibulum anteriorly.  He had a very dense large wad of fat in Calot  triangle.  It stayed very close up to the gallbladder site and carefully  freed that and peeled that back.  With carefully isolation and  skeletonization, I was able to get a good critical view and see only 2  structures going from the gallbladder down to porta hepatis consistent  with a cystic artery and cystic duct.  One clip on the gallbladder site  and 2 clips slightly proximal in the cystic artery and they were  transected.   This left one structure going from the infundibulum and gallbladder down  the porta hepatis consistent with cystic duct.  A clip was placed on the  infundibulum and a partial cysticotomy was performed.  A milked back  some purulent bile, but no stones.  A 5-French cholangiocatheter was  placed from a right subcostal puncture site and placed in the cystic  duct.   A cholangiogram was run using dilute radiopaque contrast and continuous  fluoroscopy.  Contrast flowed well  from a side branch consistent with  cystic duct cannulization.  Contrast easily refluxed well into the  common hepatic duct and up into the right and left intrahepatic chains,  then went down into common bile duct across the normal ampulla and the  duodenum with minimal pressure and no difficulty.  There were some  refluxing of the pancreatic duct as well.  A biliary system was somewhat  narrowed, but not severely so.  This was consistent with a normal  cholangiogram.  The cholangiocatheter was removed.  Three clips were  placed in the cystic duct stump.  A cystic duct was very inflamed, and  therefore I placed a 0 PDS Endoloop around the cystic duct  stump to  confirm good cystic duct ligation.   Remaining attachments from gallbladder to liver were freed off and  controlled with cautery.  More impressive posterior cystic artery and  branch, mid way up the gallbladder fossa was seen, isolated, and  controlled with clips.  The gallbladder was placed in EndoCatch bag and  removed with some general dilation at the subxiphoid port.   Copious irrigation was done and hemostasis was assured on a gallbladder  fossa.  Clips were intact on the cystic duct and arterial stumps and  frank good hemostasis.   I went ahead and did a core livery biopsies using a 14-gauge Tru-Cut.  Liver biopsy is pretty much on the medial aspect of the left pedicle  liver just to the right of the falciform ligament.  I did about 5 passes  and stopped when I got 3 excellent cores.  Hemostasis was assured using  cautery.   Re-inspection was made of the liver bed and hemostasis was good.  A  total of 4 layers of irrigation was done with clear return at the end.  Capnoperitoneum was evacuated and ports were removed.  The subxiphoid  port site was closed using 0 Vicryl figure-of-eight stitch x2 for a good  fascial closure.  Skin was closed using 4-0 Monocryl stitch.  Sterile  dressing was applied.  The patient was  extubated and sent to recovery  room in stable condition.   I discussed postoperative care with the patient and his wife just to  prior surgery and we will try and find his wife as well.  She is  apparently not in the waiting area nor in the room but hopefully they  will find her later today.      Adin Hector, MD  Electronically Signed     SCG/MEDQ  D:  09/24/2008  T:  09/25/2008  Job:  [id number]   cc:   Mayme Genta, M.D.

## 2011-01-05 ENCOUNTER — Other Ambulatory Visit: Payer: Self-pay | Admitting: *Deleted

## 2011-01-05 MED ORDER — OMEPRAZOLE MAGNESIUM 20 MG PO TBEC: 20.0000 mg | DELAYED_RELEASE_TABLET | Freq: Every day | ORAL | Status: DC

## 2011-02-09 ENCOUNTER — Ambulatory Visit (INDEPENDENT_AMBULATORY_CARE_PROVIDER_SITE_OTHER): Payer: Medicare Other | Admitting: Internal Medicine

## 2011-02-09 ENCOUNTER — Encounter: Payer: Self-pay | Admitting: Internal Medicine

## 2011-02-09 DIAGNOSIS — I472 Ventricular tachycardia: Secondary | ICD-10-CM

## 2011-02-09 DIAGNOSIS — R739 Hyperglycemia, unspecified: Secondary | ICD-10-CM

## 2011-02-09 DIAGNOSIS — I10 Essential (primary) hypertension: Secondary | ICD-10-CM

## 2011-02-09 DIAGNOSIS — E785 Hyperlipidemia, unspecified: Secondary | ICD-10-CM

## 2011-02-09 DIAGNOSIS — R7309 Other abnormal glucose: Secondary | ICD-10-CM

## 2011-02-09 DIAGNOSIS — G4730 Sleep apnea, unspecified: Secondary | ICD-10-CM

## 2011-02-09 LAB — HEPATIC FUNCTION PANEL
ALT: 22 U/L (ref 0–53)
AST: 15 U/L (ref 0–37)
Albumin: 3.8 g/dL (ref 3.5–5.2)
Alkaline Phosphatase: 37 U/L — ABNORMAL LOW (ref 39–117)
Bilirubin, Direct: 0.1 mg/dL (ref 0.0–0.3)
Total Bilirubin: 0.9 mg/dL (ref 0.3–1.2)
Total Protein: 7.3 g/dL (ref 6.0–8.3)

## 2011-02-09 LAB — LIPID PANEL
Cholesterol: 154 mg/dL (ref 0–200)
HDL: 40.3 mg/dL (ref >39.00)
LDL Cholesterol: 78 mg/dL (ref 0–99)
Total CHOL/HDL Ratio: 4
Triglycerides: 179 mg/dL — ABNORMAL HIGH (ref 0.0–149.0)
VLDL: 35.8 mg/dL (ref 0.0–40.0)

## 2011-02-09 LAB — BASIC METABOLIC PANEL
BUN: 59 mg/dL — ABNORMAL HIGH (ref 6–23)
CO2: 24 mEq/L (ref 19–32)
Calcium: 9.4 mg/dL (ref 8.4–10.5)
Chloride: 105 mEq/L (ref 96–112)
Creatinine, Ser: 3.5 mg/dL — ABNORMAL HIGH (ref 0.4–1.5)
GFR: 18.71 mL/min — ABNORMAL LOW (ref >60.00)
Glucose, Bld: 119 mg/dL — ABNORMAL HIGH (ref 70–99)
Potassium: 4.5 mEq/L (ref 3.5–5.1)
Sodium: 140 mEq/L (ref 135–145)

## 2011-02-09 LAB — HEMOGLOBIN A1C: Hgb A1c MFr Bld: 6.5 % (ref 4.6–6.5)

## 2011-02-09 NOTE — Assessment & Plan Note (Signed)
BP Readings from Last 3 Encounters:  02/09/11 94/74  08/05/10 134/84  06/10/10 124/72   Controlled Continue meds

## 2011-02-09 NOTE — Progress Notes (Signed)
  Subjective:    Patient ID: FERMON FRON, male    DOB: [birth year], 70 y.o.   MRN: [id number]  HPI  Hyperglycemia---needs f/u  htn---no home bps. Tolerating meds  Lipids---niaspan at night  gerd---tolerating PPI  Cardiac: sees dr Sherle Poe listed are updated.   Past Medical History  Diagnosis Date  . GERD 12/07/2006  . HYPERGLYCEMIA 12/07/2006  . HYPERLIPIDEMIA 12/07/2006  . HYPERTENSION 12/07/2006  . OBESITY 11/10/2009  . SLEEP APNEA 10/22/2007  . THROMBOCYTOPENIA 12/07/2006  . TRANSAMINASES, SERUM, ELEVATED 12/07/2006  . VENTRICULAR TACHYCARDIA 03/27/2007   Past Surgical History  Procedure Date  . Cholecystectomy     reports that he quit smoking about 30 years ago. He does not have any smokeless tobacco history on file. His alcohol and drug histories not on file. family history includes Diabetes in his father and mother; Heart failure in his mother; and Kidney failure in his father. No Known Allergies   Review of Systems  patient denies chest pain, shortness of breath, orthopnea. Denies lower extremity edema, abdominal pain, change in appetite, change in bowel movements. Patient denies rashes, musculoskeletal complaints. No other specific complaints in a complete review of systems.      Objective:   Physical Exam   well-developed well-nourished male in no acute distress. HEENT exam atraumatic, normocephalic, neck supple without jugular venous distention. Chest clear to auscultation cardiac exam S1-S2 are regular. Abdominal exam overweight with bowel sounds, soft and nontender. Extremities no edema. Neurologic exam is alert with a normal gait.         Assessment & Plan:

## 2011-02-09 NOTE — Assessment & Plan Note (Signed)
Using BiPAP nightly He thinks it helps significantly

## 2011-02-09 NOTE — Assessment & Plan Note (Signed)
Needs f/u Check labs today

## 2011-02-09 NOTE — Assessment & Plan Note (Signed)
meds are monitored by dr. Claiborne Billings He has regular f/u with [facility]

## 2011-02-14 ENCOUNTER — Other Ambulatory Visit (INDEPENDENT_AMBULATORY_CARE_PROVIDER_SITE_OTHER): Payer: Medicare Other

## 2011-02-14 DIAGNOSIS — N289 Disorder of kidney and ureter, unspecified: Secondary | ICD-10-CM

## 2011-02-14 LAB — BASIC METABOLIC PANEL
BUN: 37 mg/dL — ABNORMAL HIGH (ref 6–23)
CO2: 25 mEq/L (ref 19–32)
Calcium: 9.6 mg/dL (ref 8.4–10.5)
Chloride: 108 mEq/L (ref 96–112)
Creatinine, Ser: 1.6 mg/dL — ABNORMAL HIGH (ref 0.4–1.5)
GFR: 46.87 mL/min — ABNORMAL LOW (ref >60.00)
Glucose, Bld: 108 mg/dL — ABNORMAL HIGH (ref 70–99)
Potassium: 4.8 mEq/L (ref 3.5–5.1)
Sodium: 141 mEq/L (ref 135–145)

## 2011-02-16 ENCOUNTER — Telehealth: Payer: Self-pay

## 2011-02-16 NOTE — Telephone Encounter (Signed)
Pt called requesting lab results. Called to give pt lab results and left a message to call back.

## 2011-02-17 NOTE — Telephone Encounter (Signed)
Pt aware of results 

## 2011-02-18 ENCOUNTER — Ambulatory Visit (INDEPENDENT_AMBULATORY_CARE_PROVIDER_SITE_OTHER): Payer: Medicare Other | Admitting: Internal Medicine

## 2011-02-18 VITALS — BP 142/80

## 2011-02-18 DIAGNOSIS — I10 Essential (primary) hypertension: Secondary | ICD-10-CM

## 2011-02-18 NOTE — Progress Notes (Signed)
Pt came in for a BP check and has been off the Lisinopril for 1 wk.  He had cortisone injection yesterday and BP was 180/90

## 2011-02-22 ENCOUNTER — Telehealth: Payer: Self-pay | Admitting: Internal Medicine

## 2011-02-22 NOTE — Telephone Encounter (Signed)
Dr Leanne Chang, could you look at the nurse visit and advise if you want him to stay off the lisinopril?

## 2011-02-22 NOTE — Telephone Encounter (Signed)
Would like Martin Cordova to return call today about his Lisinopril. Needs to know what to do? Thanks.

## 2011-02-24 NOTE — Telephone Encounter (Signed)
Stay off lisinopril and recheck in 4 weeks

## 2011-02-24 NOTE — Telephone Encounter (Signed)
Pt aware.

## 2011-03-02 NOTE — Progress Notes (Signed)
  Subjective:    Patient ID: Martin Cordova, male    DOB: 1940/12/23, 70 y.o.   MRN: [id number]  HPI Reviewed bp   Review of Systems     Objective:   Physical Exam        Assessment & Plan:

## 2011-03-14 ENCOUNTER — Ambulatory Visit (INDEPENDENT_AMBULATORY_CARE_PROVIDER_SITE_OTHER): Payer: Medicare Other | Admitting: Internal Medicine

## 2011-03-14 DIAGNOSIS — I10 Essential (primary) hypertension: Secondary | ICD-10-CM

## 2011-03-14 DIAGNOSIS — E785 Hyperlipidemia, unspecified: Secondary | ICD-10-CM

## 2011-03-14 MED ORDER — LISINOPRIL-HYDROCHLOROTHIAZIDE 20-12.5 MG PO TABS: 1.0000 | ORAL_TABLET | Freq: Every day | ORAL | Status: DC

## 2011-03-14 NOTE — Assessment & Plan Note (Signed)
Not as well controlled at home i'm going to assume that CREatinine elevation was nsaid related Resume lisinopril / hct at lower dose Check labs friday

## 2011-03-14 NOTE — Progress Notes (Signed)
  Subjective:    Patient ID: Martin Cordova, male    DOB: 08-Apr-1941, 70 y.o.   MRN: [id number]  HPI  Elevated creatinine--thought to be lisinopril related Pt states at the time of elevated CRT---was taking ibuprofen 800 mg po qid He stopped CRT and ibuprofen He has noted some LE edema sense stopping lisinopril and hct Past Medical History  Diagnosis Date  . GERD 12/07/2006  . HYPERGLYCEMIA 12/07/2006  . HYPERLIPIDEMIA 12/07/2006  . HYPERTENSION 12/07/2006  . OBESITY 11/10/2009  . SLEEP APNEA 10/22/2007  . THROMBOCYTOPENIA 12/07/2006  . TRANSAMINASES, SERUM, ELEVATED 12/07/2006  . VENTRICULAR TACHYCARDIA 03/27/2007   Past Surgical History  Procedure Date  . Cholecystectomy     reports that he quit smoking about 30 years ago. He does not have any smokeless tobacco history on file. His alcohol and drug histories not on file. family history includes Diabetes in his father and mother; Heart failure in his mother; and Kidney failure in his father. No Known Allergies   Review of Systems  patient denies chest pain, shortness of breath, orthopnea. Denies lower extremity edema, abdominal pain, change in appetite, change in bowel movements. Patient denies rashes, musculoskeletal complaints. No other specific complaints in a complete review of systems.      Objective:   Physical Exam  well-developed well-nourished male in no acute distress. HEENT exam atraumatic, normocephalic, neck supple without jugular venous distention. Chest clear to auscultation cardiac exam S1-S2 are regular. Abdominal exam overweight with bowel sounds, soft and nontender.       Assessment & Plan:

## 2011-03-18 ENCOUNTER — Other Ambulatory Visit (INDEPENDENT_AMBULATORY_CARE_PROVIDER_SITE_OTHER): Payer: Medicare Other

## 2011-03-18 DIAGNOSIS — I10 Essential (primary) hypertension: Secondary | ICD-10-CM

## 2011-03-18 LAB — BASIC METABOLIC PANEL
BUN: 28 mg/dL — ABNORMAL HIGH (ref 6–23)
CO2: 29 mEq/L (ref 19–32)
Calcium: 9.2 mg/dL (ref 8.4–10.5)
Chloride: 104 mEq/L (ref 96–112)
Creatinine, Ser: 1.6 mg/dL — ABNORMAL HIGH (ref 0.4–1.5)
GFR: 46.86 mL/min — ABNORMAL LOW (ref >60.00)
Glucose, Bld: 121 mg/dL — ABNORMAL HIGH (ref 70–99)
Potassium: 4.5 mEq/L (ref 3.5–5.1)
Sodium: 140 mEq/L (ref 135–145)

## 2011-06-01 ENCOUNTER — Ambulatory Visit (INDEPENDENT_AMBULATORY_CARE_PROVIDER_SITE_OTHER): Payer: Medicare Other | Admitting: Internal Medicine

## 2011-06-01 DIAGNOSIS — M549 Dorsalgia, unspecified: Secondary | ICD-10-CM

## 2011-06-01 DIAGNOSIS — R319 Hematuria, unspecified: Secondary | ICD-10-CM

## 2011-06-01 DIAGNOSIS — R109 Unspecified abdominal pain: Secondary | ICD-10-CM | POA: Insufficient documentation

## 2011-06-01 DIAGNOSIS — N289 Disorder of kidney and ureter, unspecified: Secondary | ICD-10-CM | POA: Insufficient documentation

## 2011-06-01 DIAGNOSIS — I10 Essential (primary) hypertension: Secondary | ICD-10-CM

## 2011-06-01 LAB — POCT URINALYSIS DIPSTICK
Bilirubin, UA: NEGATIVE
Blood, UA: NEGATIVE
Glucose, UA: NEGATIVE
Ketones, UA: NEGATIVE
Leukocytes, UA: NEGATIVE
Nitrite, UA: NEGATIVE
Spec Grav, UA: 1.02
Urobilinogen, UA: 0.2
pH, UA: 5.5

## 2011-06-01 MED ORDER — CIPROFLOXACIN HCL 250 MG PO TABS: 250.0000 mg | ORAL_TABLET | Freq: Two times a day (BID) | ORAL | Status: AC

## 2011-06-01 MED ORDER — METRONIDAZOLE 500 MG PO TABS: 500.0000 mg | ORAL_TABLET | Freq: Three times a day (TID) | ORAL | Status: AC

## 2011-06-01 MED ORDER — TRAMADOL HCL 50 MG PO TABS: 50.0000 mg | ORAL_TABLET | Freq: Three times a day (TID) | ORAL | Status: AC | PRN

## 2011-06-01 NOTE — Patient Instructions (Signed)
Please complete the following lab tests before your next follow up appointment: BMET - [phone] 

## 2011-06-01 NOTE — Assessment & Plan Note (Signed)
70 year old with intermittent abdominal pain. I suspect diverticulitis. Treat with empiric Flagyl and Cipro. His UA was negative for hematuria. I doubt his symptoms are attributable to kidney stone. If symptoms worsen consider CT of abdomen and pelvis. Use tramadol for pain.

## 2011-06-01 NOTE — Assessment & Plan Note (Signed)
Patient strongly advised to discontinue all NSAIDs. Monitor kidney function. Use tramadol and Tylenol for back pain.  Follow up with PCP.

## 2011-06-01 NOTE — Assessment & Plan Note (Addendum)
Stable. Continue current medication regimen.  Monitor renal function. BP: 124/74 mmHg

## 2011-06-01 NOTE — Progress Notes (Signed)
Subjective:    Patient ID: Martin Cordova, male    DOB: 08/19/1940, 70 y.o.   MRN: [id number]  HPI  A 70 year old white male complains of left lower quadrant pain x several weeks. His symptoms started before Christmas. His pain starts in the left lower quadrant and radiates to the center near umbilicus. Most recently patient reports he now has pain in the center of his abdomen and left sided back pain. He denies hematuria or dysuria.  Chart review reveals renal insufficiency with creatinine of 1.6. In September patient had acute renal failure with creatinine greater than 3. This was associated with NSAID use for chronic back pain while taking ACE inhibitor. ACE inhibitors were discontinued and restarted. His creatinine has been stable. He still has intermittent back pain and takes Advil on occasion.  Review of Systems Negative for fever or chills,  Occasional loose stools  Past Medical History  Diagnosis Date  . GERD 12/07/2006  . HYPERGLYCEMIA 12/07/2006  . HYPERLIPIDEMIA 12/07/2006  . HYPERTENSION 12/07/2006  . OBESITY 11/10/2009  . SLEEP APNEA 10/22/2007  . THROMBOCYTOPENIA 12/07/2006  . TRANSAMINASES, SERUM, ELEVATED 12/07/2006  . VENTRICULAR TACHYCARDIA 03/27/2007    History   Social History  . Marital Status: Married    Spouse Name: N/A    Number of Children: N/A  . Years of Education: N/A   Occupational History  . Not on file.   Social History Main Topics  . Smoking status: Former Smoker    Quit date: 08/04/1980  . Smokeless tobacco: Not on file  . Alcohol Use: Not on file  . Drug Use: Not on file  . Sexually Active: Not on file   Other Topics Concern  . Not on file   Social History Narrative  . No narrative on file    Past Surgical History  Procedure Date  . Cholecystectomy     Family History  Problem Relation Age of Onset  . Diabetes Mother   . Heart failure Mother   . Kidney failure Father   . Diabetes Father     No Known Allergies  Current Outpatient  Prescriptions on File Prior to Visit  Medication Sig Dispense Refill  . aspirin 325 MG tablet Take 325 mg by mouth daily.        . Cholecalciferol (VITAMIN D3) 1000 UNITS CAPS Take 1,000 Units by mouth daily.        . fish oil-omega-3 fatty acids 1000 MG capsule Take 2 g by mouth daily.        Marland Kitchen lisinopril-hydrochlorothiazide (PRINZIDE,ZESTORETIC) 20-12.5 MG per tablet Take 1 tablet by mouth daily.  180 tablet  3  . Multiple Vitamin (MULTIVITAMIN) tablet Take 1 tablet by mouth daily.        . niacin (NIASPAN) 500 MG CR tablet Take 500 mg by mouth at bedtime.        Marland Kitchen omeprazole (PRILOSEC OTC) 20 MG tablet Take 1 tablet (20 mg total) by mouth daily.  42 tablet  3  . sildenafil (VIAGRA) 100 MG tablet Take 100 mg by mouth daily as needed.          BP 124/74  Temp(Src) 98.2 F (36.8 C) (Oral)  Wt 248 lb (112.492 kg)       Objective:   Physical Exam  Constitutional: pleasant, obese  Head: Normocephalic and atraumatic.  Neck: Normal range of motion. Neck supple. No thyromegaly present. No carotid bruit Cardiovascular: Normal rate, regular rhythm and normal heart sounds.  Exam reveals no gallop  and no friction rub.  No murmur heard. Pulmonary/Chest: Effort normal and breath sounds normal.  No wheezes. No rales.  Abdominal: Soft. Bowel sounds are normal. mild bilateral lower quadrant tenderness,  No rebound or guarding.  No flank tenderness Rectal- no rectal mass,  No prostate tenderness,  Heme negative Neurological: Alert. No cranial nerve deficit.  Skin: Skin is warm and dry.  Psychiatric: Normal mood and affect. Behavior is normal.       Assessment & Plan:

## 2011-06-21 ENCOUNTER — Other Ambulatory Visit (INDEPENDENT_AMBULATORY_CARE_PROVIDER_SITE_OTHER): Payer: Medicare Other

## 2011-06-21 DIAGNOSIS — I10 Essential (primary) hypertension: Secondary | ICD-10-CM

## 2011-06-21 LAB — BASIC METABOLIC PANEL
BUN: 50 mg/dL — ABNORMAL HIGH (ref 6–23)
CO2: 21 mEq/L (ref 19–32)
Calcium: 9.4 mg/dL (ref 8.4–10.5)
Chloride: 108 mEq/L (ref 96–112)
Creatinine, Ser: 2.4 mg/dL — ABNORMAL HIGH (ref 0.4–1.5)
GFR: 28.15 mL/min — ABNORMAL LOW (ref >60.00)
Glucose, Bld: 106 mg/dL — ABNORMAL HIGH (ref 70–99)
Potassium: 4.8 mEq/L (ref 3.5–5.1)
Sodium: 138 mEq/L (ref 135–145)

## 2011-06-28 ENCOUNTER — Ambulatory Visit (INDEPENDENT_AMBULATORY_CARE_PROVIDER_SITE_OTHER): Payer: Medicare Other | Admitting: Internal Medicine

## 2011-06-28 ENCOUNTER — Other Ambulatory Visit: Payer: Self-pay | Admitting: Internal Medicine

## 2011-06-28 ENCOUNTER — Encounter: Payer: Self-pay | Admitting: Internal Medicine

## 2011-06-28 VITALS — BP 122/74 | HR 72 | Temp 97.9°F | Ht 72.0 in | Wt 242.0 lb

## 2011-06-28 DIAGNOSIS — E785 Hyperlipidemia, unspecified: Secondary | ICD-10-CM

## 2011-06-28 DIAGNOSIS — I10 Essential (primary) hypertension: Secondary | ICD-10-CM

## 2011-06-28 DIAGNOSIS — D696 Thrombocytopenia, unspecified: Secondary | ICD-10-CM

## 2011-06-28 DIAGNOSIS — N289 Disorder of kidney and ureter, unspecified: Secondary | ICD-10-CM

## 2011-06-28 LAB — LIPID PANEL
Cholesterol: 151 mg/dL (ref 0–200)
Cholesterol: 151 mg/dL (ref 0–200)
HDL: 34.3 mg/dL — ABNORMAL LOW (ref >39.00)
HDL: 34.3 mg/dL — ABNORMAL LOW (ref >39.00)
Total CHOL/HDL Ratio: 4
Total CHOL/HDL Ratio: 4
Triglycerides: 252 mg/dL — ABNORMAL HIGH (ref 0.0–149.0)
Triglycerides: 252 mg/dL — ABNORMAL HIGH (ref 0.0–149.0)
VLDL: 50.4 mg/dL — ABNORMAL HIGH (ref 0.0–40.0)
VLDL: 50.4 mg/dL — ABNORMAL HIGH (ref 0.0–40.0)

## 2011-06-28 LAB — HEPATIC FUNCTION PANEL
ALT: 30 U/L (ref 0–53)
ALT: 30 U/L (ref 0–53)
AST: 18 U/L (ref 0–37)
AST: 18 U/L (ref 0–37)
Albumin: 4 g/dL (ref 3.5–5.2)
Albumin: 4 g/dL (ref 3.5–5.2)
Alkaline Phosphatase: 45 U/L (ref 39–117)
Alkaline Phosphatase: 45 U/L (ref 39–117)
Bilirubin, Direct: 0 mg/dL (ref 0.0–0.3)
Bilirubin, Direct: 0 mg/dL (ref 0.0–0.3)
Total Bilirubin: 0.5 mg/dL (ref 0.3–1.2)
Total Bilirubin: 0.5 mg/dL (ref 0.3–1.2)
Total Protein: 7.4 g/dL (ref 6.0–8.3)
Total Protein: 7.4 g/dL (ref 6.0–8.3)

## 2011-06-28 LAB — CBC WITH DIFFERENTIAL/PLATELET
Basophils Absolute: 0 10*3/uL (ref 0.0–0.1)
Basophils Absolute: 0 10*3/uL (ref 0.0–0.1)
Basophils Relative: 0.4 % (ref 0.0–3.0)
Basophils Relative: 0.5 % (ref 0.0–3.0)
Eosinophils Absolute: 0.1 10*3/uL (ref 0.0–0.7)
Eosinophils Absolute: 0.1 10*3/uL (ref 0.0–0.7)
Eosinophils Relative: 1.5 % (ref 0.0–5.0)
Eosinophils Relative: 1.9 % (ref 0.0–5.0)
HCT: 35.6 % — ABNORMAL LOW (ref 39.0–52.0)
HCT: 35.2 % — ABNORMAL LOW (ref 39.0–52.0)
Hemoglobin: 11.9 g/dL — ABNORMAL LOW (ref 13.0–17.0)
Hemoglobin: 12 g/dL — ABNORMAL LOW (ref 13.0–17.0)
Lymphocytes Relative: 19.8 % (ref 12.0–46.0)
Lymphocytes Relative: 19.9 % (ref 12.0–46.0)
Lymphs Abs: 0.9 10*3/uL (ref 0.7–4.0)
Lymphs Abs: 0.9 10*3/uL (ref 0.7–4.0)
MCHC: 33.5 g/dL (ref 30.0–36.0)
MCHC: 34 g/dL (ref 30.0–36.0)
MCV: 89.7 fl (ref 78.0–100.0)
MCV: 88.8 fl (ref 78.0–100.0)
Monocytes Absolute: 0.5 10*3/uL (ref 0.1–1.0)
Monocytes Absolute: 0.5 10*3/uL (ref 0.1–1.0)
Monocytes Relative: 10.1 % (ref 3.0–12.0)
Monocytes Relative: 10.4 % (ref 3.0–12.0)
Neutro Abs: 3.3 10*3/uL (ref 1.4–7.7)
Neutro Abs: 3.2 10*3/uL (ref 1.4–7.7)
Neutrophils Relative %: 68.2 % (ref 43.0–77.0)
Neutrophils Relative %: 67.3 % (ref 43.0–77.0)
Platelets: 99 10*3/uL — ABNORMAL LOW (ref 150.0–400.0)
Platelets: 103 10*3/uL — ABNORMAL LOW (ref 150.0–400.0)
RBC: 3.97 Mil/uL — ABNORMAL LOW (ref 4.22–5.81)
RBC: 3.96 Mil/uL — ABNORMAL LOW (ref 4.22–5.81)
RDW: 13.9 % (ref 11.5–14.6)
RDW: 13.8 % (ref 11.5–14.6)
WBC: 4.8 10*3/uL (ref 4.5–10.5)
WBC: 4.7 10*3/uL (ref 4.5–10.5)

## 2011-06-28 LAB — BASIC METABOLIC PANEL
BUN: 50 mg/dL — ABNORMAL HIGH (ref 6–23)
BUN: 50 mg/dL — ABNORMAL HIGH (ref 6–23)
CO2: 20 mEq/L (ref 19–32)
CO2: 20 mEq/L (ref 19–32)
Calcium: 9.5 mg/dL (ref 8.4–10.5)
Calcium: 9.5 mg/dL (ref 8.4–10.5)
Chloride: 113 mEq/L — ABNORMAL HIGH (ref 96–112)
Chloride: 113 mEq/L — ABNORMAL HIGH (ref 96–112)
Creatinine, Ser: 2.4 mg/dL — ABNORMAL HIGH (ref 0.4–1.5)
Creatinine, Ser: 2.4 mg/dL — ABNORMAL HIGH (ref 0.4–1.5)
GFR: 28.42 mL/min — ABNORMAL LOW (ref >60.00)
GFR: 28.42 mL/min — ABNORMAL LOW (ref >60.00)
Glucose, Bld: 84 mg/dL (ref 70–99)
Glucose, Bld: 84 mg/dL (ref 70–99)
Potassium: 4.9 mEq/L (ref 3.5–5.1)
Potassium: 4.9 mEq/L (ref 3.5–5.1)
Sodium: 141 mEq/L (ref 135–145)
Sodium: 141 mEq/L (ref 135–145)

## 2011-06-28 MED ORDER — OMEPRAZOLE MAGNESIUM 20 MG PO TBEC: 20.0000 mg | DELAYED_RELEASE_TABLET | Freq: Every day | ORAL | Status: DC

## 2011-06-28 MED ORDER — LISINOPRIL-HYDROCHLOROTHIAZIDE 20-12.5 MG PO TABS: 1.0000 | ORAL_TABLET | Freq: Every day | ORAL | Status: DC

## 2011-06-28 NOTE — Assessment & Plan Note (Signed)
BP Readings from Last 3 Encounters:  06/28/11 122/74  06/01/11 124/74  03/14/11 158/80   bp well controlled Continue same meds

## 2011-06-28 NOTE — Assessment & Plan Note (Signed)
CBC:    Component Value Date/Time   WBC 4.7 08/05/2010 0856   HGB 13.2 08/05/2010 0856   HCT 38.2* 08/05/2010 0856   PLT 120.0* 08/05/2010 0856   MCV 92.9 08/05/2010 0856   NEUTROABS 2.8 08/05/2010 0856   LYMPHSABS 1.3 08/05/2010 0856   MONOABS 0.5 08/05/2010 0856   EOSABS 0.1 08/05/2010 0856   BASOSABS 0.0 08/05/2010 0856   Platelets have been stable previously

## 2011-06-28 NOTE — Assessment & Plan Note (Signed)
Continue meds Check labs today

## 2011-06-28 NOTE — Assessment & Plan Note (Signed)
Note creatinine 2.4 when acutely ill See dr. Lora Havens note Recheck bmet today

## 2011-06-28 NOTE — Progress Notes (Signed)
Addended by: Elmer Picker on: 06/28/2011 11:59 AM   Modules accepted: Orders

## 2011-06-28 NOTE — Progress Notes (Signed)
Patient ID: Martin Cordova, male   DOB: [birth year], 71 y.o.   MRN: [id number] Reviewed dr. Lora Havens note---pt's sxs are completely resolved  htn---tolerating meds  Lipids--here for f/u. Tolerating meds  GERD---no sxs on ppi  Past Medical History  Diagnosis Date  . GERD 12/07/2006  . HYPERGLYCEMIA 12/07/2006  . HYPERLIPIDEMIA 12/07/2006  . HYPERTENSION 12/07/2006  . OBESITY 11/10/2009  . SLEEP APNEA 10/22/2007  . THROMBOCYTOPENIA 12/07/2006  . TRANSAMINASES, SERUM, ELEVATED 12/07/2006  . VENTRICULAR TACHYCARDIA 03/27/2007    History   Social History  . Marital Status: Married    Spouse Name: N/A    Number of Children: N/A  . Years of Education: N/A   Occupational History  . Not on file.   Social History Main Topics  . Smoking status: Former Smoker    Quit date: 08/04/1980  . Smokeless tobacco: Not on file  . Alcohol Use: Not on file  . Drug Use: Not on file  . Sexually Active: Not on file   Other Topics Concern  . Not on file   Social History Narrative  . No narrative on file    Past Surgical History  Procedure Date  . Cholecystectomy     Family History  Problem Relation Age of Onset  . Diabetes Mother   . Heart failure Mother   . Kidney failure Father   . Diabetes Father     No Known Allergies  Current Outpatient Prescriptions on File Prior to Visit  Medication Sig Dispense Refill  . aspirin 325 MG tablet Take 325 mg by mouth daily.        . carvedilol (COREG) 25 MG tablet 2 tabs in the morning 1 1/2 tabs in the evening      . Cholecalciferol (VITAMIN D3) 1000 UNITS CAPS Take 1,000 Units by mouth daily.        . fish oil-omega-3 fatty acids 1000 MG capsule Take 2 g by mouth daily.        . Multiple Vitamin (MULTIVITAMIN) tablet Take 1 tablet by mouth daily.        . niacin (NIASPAN) 500 MG CR tablet Take 500 mg by mouth at bedtime.        . sildenafil (VIAGRA) 100 MG tablet Take 100 mg by mouth daily as needed.           patient denies chest pain, shortness of  breath, orthopnea. Denies lower extremity edema, abdominal pain, change in appetite, change in bowel movements. Patient denies rashes, musculoskeletal complaints. No other specific complaints in a complete review of systems.   BP 122/74  Pulse 72  Temp(Src) 97.9 F (36.6 C) (Oral)  Ht 6' (1.829 m)  Wt 242 lb (109.77 kg)  BMI 32.82 kg/m2  well-developed well-nourished male in no acute distress. HEENT exam atraumatic, normocephalic, neck supple without jugular venous distention. Chest clear to auscultation cardiac exam S1-S2 are regular. Abdominal exam overweight with bowel sounds, soft and nontender. Extremities no edema. Neurologic exam is alert with a normal gait.

## 2011-06-29 LAB — LDL CHOLESTEROL, DIRECT
Direct LDL: 87.6 mg/dL
Direct LDL: 87.4 mg/dL

## 2011-07-04 ENCOUNTER — Telehealth: Payer: Self-pay | Admitting: *Deleted

## 2011-07-04 ENCOUNTER — Other Ambulatory Visit: Payer: Self-pay | Admitting: Internal Medicine

## 2011-07-04 MED ORDER — AMLODIPINE BESYLATE 5 MG PO TABS: 5.0000 mg | ORAL_TABLET | Freq: Every day | ORAL | Status: DC

## 2011-07-04 NOTE — Telephone Encounter (Signed)
Updated med list and allergy list

## 2011-07-05 ENCOUNTER — Telehealth: Payer: Self-pay | Admitting: Internal Medicine

## 2011-07-05 NOTE — Telephone Encounter (Addendum)
bmet in 1 wk, appt scheduled

## 2011-07-05 NOTE — Telephone Encounter (Signed)
Pt called and said that he needs to get some labs order for next wk re: abnormal results from pts last labs. Pls order labs needed.

## 2011-07-12 ENCOUNTER — Other Ambulatory Visit (INDEPENDENT_AMBULATORY_CARE_PROVIDER_SITE_OTHER): Payer: Medicare Other

## 2011-07-12 DIAGNOSIS — I10 Essential (primary) hypertension: Secondary | ICD-10-CM

## 2011-07-12 LAB — BASIC METABOLIC PANEL
BUN: 32 mg/dL — ABNORMAL HIGH (ref 6–23)
CO2: 25 mEq/L (ref 19–32)
Calcium: 9.1 mg/dL (ref 8.4–10.5)
Chloride: 111 mEq/L (ref 96–112)
Creatinine, Ser: 2.2 mg/dL — ABNORMAL HIGH (ref 0.4–1.5)
GFR: 31.72 mL/min — ABNORMAL LOW (ref >60.00)
Glucose, Bld: 83 mg/dL (ref 70–99)
Potassium: 4.3 mEq/L (ref 3.5–5.1)
Sodium: 141 mEq/L (ref 135–145)

## 2011-07-18 ENCOUNTER — Ambulatory Visit (INDEPENDENT_AMBULATORY_CARE_PROVIDER_SITE_OTHER): Payer: Medicare Other | Admitting: Family

## 2011-07-18 ENCOUNTER — Encounter: Payer: Self-pay | Admitting: Family

## 2011-07-18 VITALS — BP 126/78 | Temp 97.9°F | Wt 250.0 lb

## 2011-07-18 DIAGNOSIS — K5732 Diverticulitis of large intestine without perforation or abscess without bleeding: Secondary | ICD-10-CM

## 2011-07-18 MED ORDER — MOXIFLOXACIN HCL 400 MG PO TABS: 400.0000 mg | ORAL_TABLET | Freq: Every day | ORAL | Status: AC

## 2011-07-18 NOTE — Patient Instructions (Signed)
Diverticulitis A diverticulum is a small pouch or sac on the colon. Diverticulosis is the presence of these diverticula on the colon. Diverticulitis is the irritation (inflammation) or infection of diverticula. CAUSES  The colon and its diverticula contain bacteria. If food particles block the tiny opening to a diverticulum, the bacteria inside can grow and cause an increase in pressure. This leads to infection and inflammation and is called diverticulitis. SYMPTOMS   Abdominal pain and tenderness. Usually, the pain is located on the left side of your abdomen. However, it could be located elsewhere.   Fever.   Bloating.   Feeling sick to your stomach (nausea).   Throwing up (vomiting).   Abnormal stools.  DIAGNOSIS  Your caregiver will take a history and perform a physical exam. Since many things can cause abdominal pain, other tests may be necessary. Tests may include:  Blood tests.   Urine tests.   X-ray of the abdomen.   CT scan of the abdomen.  Sometimes, surgery is needed to determine if diverticulitis or other conditions are causing your symptoms. TREATMENT  Most of the time, you can be treated without surgery. Treatment includes:  Resting the bowels by only having liquids for a few days. As you improve, you will need to eat a low-fiber diet.   Intravenous (IV) fluids if you are losing body fluids (dehydrated).   Antibiotic medicines that treat infections may be given.   Pain and nausea medicine, if needed.   Surgery if the inflamed diverticulum has burst.  HOME CARE INSTRUCTIONS   Try a clear liquid diet (broth, tea, or water for as long as directed by your caregiver). You may then gradually begin a low-fiber diet as tolerated. A low-fiber diet is a diet with less than 10 grams of fiber. Choose the foods below to reduce fiber in the diet:   White breads, cereals, rice, and pasta.   Cooked fruits and vegetables or soft fresh fruits and vegetables without the skin.     Ground or well-cooked tender beef, ham, veal, lamb, pork, or poultry.   Eggs and seafood.   After your diverticulitis symptoms have improved, your caregiver may put you on a high-fiber diet. A high-fiber diet includes 14 grams of fiber for every 1000 calories consumed. For a standard 2000 calorie diet, you would need 28 grams of fiber. Follow these diet guidelines to help you increase the fiber in your diet. It is important to slowly increase the amount fiber in your diet to avoid gas, constipation, and bloating.   Choose whole-grain breads, cereals, pasta, and brown rice.   Choose fresh fruits and vegetables with the skin on. Do not overcook vegetables because the more vegetables are cooked, the more fiber is lost.   Choose more nuts, seeds, legumes, dried peas, beans, and lentils.   Look for food products that have greater than 3 grams of fiber per serving on the Nutrition Facts label.   Take all medicine as directed by your caregiver.   If your caregiver has given you a follow-up appointment, it is very important that you go. Not going could result in lasting (chronic) or permanent injury, pain, and disability. If there is any problem keeping the appointment, call to reschedule.  SEEK MEDICAL CARE IF:   Your pain does not improve.   You have a hard time advancing your diet beyond clear liquids.   Your bowel movements do not return to normal.  SEEK IMMEDIATE MEDICAL CARE IF:   Your pain becomes   worse.   You have an oral temperature above 102 F (38.9 C), not controlled by medicine.   You have repeated vomiting.   You have bloody or black, tarry stools.   Symptoms that brought you to your caregiver become worse or are not getting better.  MAKE SURE YOU:   Understand these instructions.   Will watch your condition.   Will get help right away if you are not doing well or get worse.  Document Released: 03/02/2005 Document Revised: 02/02/2011 Document Reviewed:  06/28/2010 [facility] Patient Information 2012 [place].

## 2011-07-18 NOTE — Progress Notes (Signed)
Subjective:    Patient ID: Martin Cordova, male    DOB: 11-26-1940, 71 y.o.   MRN: [id number]  HPI 71 year old white male, nonsmoker patient and Dr. Leanne Chang  today with complaints of left lower quadrant abdominal pain that radiates to his umbilicus.  The pain is sharp, rates it 8/10 that is worse with movement. He was seen by his PCP last month and was treated with Flagyl and Cipro for diverticulitis flare. Patient reports that he did not take the Flagyl because it upset his stomach. He did complete the Cipro. He is requesting to be referred back to gastroenterology because his colonoscopy screening is due this year.   Review of Systems  HENT: Negative.   Eyes: Negative.   Respiratory: Negative.   Cardiovascular: Negative.   Gastrointestinal: Positive for abdominal pain.       Left lower quadrant  Genitourinary: Negative.   Musculoskeletal: Negative.   Skin: Negative.   Neurological: Negative.   Hematological: Negative.   Psychiatric/Behavioral: Negative.    Past Medical History  Diagnosis Date  . GERD 12/07/2006  . HYPERGLYCEMIA 12/07/2006  . HYPERLIPIDEMIA 12/07/2006  . HYPERTENSION 12/07/2006  . OBESITY 11/10/2009  . SLEEP APNEA 10/22/2007  . THROMBOCYTOPENIA 12/07/2006  . TRANSAMINASES, SERUM, ELEVATED 12/07/2006  . VENTRICULAR TACHYCARDIA 03/27/2007    History   Social History  . Marital Status: Married    Spouse Name: N/A    Number of Children: N/A  . Years of Education: N/A   Occupational History  . Not on file.   Social History Main Topics  . Smoking status: Former Smoker    Quit date: 08/04/1980  . Smokeless tobacco: Not on file  . Alcohol Use: Not on file  . Drug Use: Not on file  . Sexually Active: Not on file   Other Topics Concern  . Not on file   Social History Narrative  . No narrative on file    Past Surgical History  Procedure Date  . Cholecystectomy     Family History  Problem Relation Age of Onset  . Diabetes Mother   . Heart failure Mother     . Kidney failure Father   . Diabetes Father     Allergies  Allergen Reactions  . Lisinopril     Renal insufficiency    Current Outpatient Prescriptions on File Prior to Visit  Medication Sig Dispense Refill  . amLODipine (NORVASC) 5 MG tablet Take 1 tablet (5 mg total) by mouth daily.  30 tablet  11  . aspirin 325 MG tablet Take 325 mg by mouth daily.        . carvedilol (COREG) 25 MG tablet 2 tabs in the morning 1 1/2 tabs in the evening      . Cholecalciferol (VITAMIN D3) 1000 UNITS CAPS Take 1,000 Units by mouth daily.        . fish oil-omega-3 fatty acids 1000 MG capsule Take 2 g by mouth daily.        . Multiple Vitamin (MULTIVITAMIN) tablet Take 1 tablet by mouth daily.        . niacin (NIASPAN) 500 MG CR tablet Take 500 mg by mouth at bedtime.        Marland Kitchen omeprazole (PRILOSEC OTC) 20 MG tablet Take 1 tablet (20 mg total) by mouth daily.  30 tablet  5  . PRILOSEC OTC 20 MG tablet TAKE ONE CAPSULE EVERY DAY  42 tablet  3  . sildenafil (VIAGRA) 100 MG tablet Take 100 mg by  mouth daily as needed.          BP 126/78  Temp(Src) 97.9 F (36.6 C) (Oral)  Wt 250 lb (113.399 kg)chart    Objective:   Physical Exam  Constitutional: He is oriented to person, place, and time. He appears well-developed.  Neck: Normal range of motion. Neck supple.  Cardiovascular: Normal rate, regular rhythm and normal heart sounds.   Pulmonary/Chest: Effort normal and breath sounds normal.  Abdominal: Bowel sounds are normal. There is tenderness.       Left lower quadrant  Neurological: He is alert and oriented to person, place, and time.  Skin: Skin is warm.          Assessment & Plan:   Assessment: Left lower quadrant pain, diverticulitis  Plan: We'll treat with Avelox to cover gram-negative and gram-positive bacteria 400 mg once daily x10 days. Refer to gastroenterology patient to call if his symptoms worsen or persist, return to schedule and when necessary.

## 2011-07-25 ENCOUNTER — Telehealth: Payer: Self-pay | Admitting: Internal Medicine

## 2011-07-25 MED ORDER — FUROSEMIDE 20 MG PO TABS: 20.0000 mg | ORAL_TABLET | Freq: Every day | ORAL | Status: DC

## 2011-07-25 NOTE — Telephone Encounter (Signed)
Have him start furosemide 20 mg po qd He should have upcoming appt for BMET--please make sure this happens within the next 2 weeks

## 2011-07-25 NOTE — Telephone Encounter (Signed)
Pls advise.  

## 2011-07-25 NOTE — Telephone Encounter (Signed)
Given pt Dr. Leanne Chang recommendations. He already has his appt for labs, and the furosomide.

## 2011-07-25 NOTE — Telephone Encounter (Signed)
Pt. Has stopped taking this and now he is having ankle swelling (lisinopril-hydrochlorothiazide) what should he do about this

## 2011-07-26 ENCOUNTER — Encounter: Payer: Self-pay | Admitting: Family Medicine

## 2011-07-26 ENCOUNTER — Ambulatory Visit (INDEPENDENT_AMBULATORY_CARE_PROVIDER_SITE_OTHER): Payer: Medicare Other | Admitting: Family Medicine

## 2011-07-26 DIAGNOSIS — R319 Hematuria, unspecified: Secondary | ICD-10-CM

## 2011-07-26 DIAGNOSIS — N289 Disorder of kidney and ureter, unspecified: Secondary | ICD-10-CM

## 2011-07-26 LAB — BASIC METABOLIC PANEL
BUN: 27 mg/dL — ABNORMAL HIGH (ref 6–23)
CO2: 25 mEq/L (ref 19–32)
Calcium: 9 mg/dL (ref 8.4–10.5)
Chloride: 106 mEq/L (ref 96–112)
Creatinine, Ser: 2.4 mg/dL — ABNORMAL HIGH (ref 0.4–1.5)
GFR: 28.55 mL/min — ABNORMAL LOW (ref >60.00)
Glucose, Bld: 100 mg/dL — ABNORMAL HIGH (ref 70–99)
Potassium: 4.4 mEq/L (ref 3.5–5.1)
Sodium: 139 mEq/L (ref 135–145)

## 2011-07-26 LAB — POCT URINALYSIS DIPSTICK
Bilirubin, UA: NEGATIVE
Glucose, UA: NEGATIVE
Ketones, UA: NEGATIVE
Nitrite, UA: NEGATIVE
Spec Grav, UA: 1.015
Urobilinogen, UA: 0.2
pH, UA: 5

## 2011-07-26 NOTE — Patient Instructions (Signed)
Follow up for any fever or urinary difficulty

## 2011-07-26 NOTE — Progress Notes (Signed)
  Subjective:    Patient ID: AOUS MODEL, male    DOB: Sep 13, 1940, 71 y.o.   MRN: [id number]  HPI  Acute visit. Patient seen with gross hematuria around 3 AM today. No prior history of similar symptoms. Recent history is that he had presumably acute diverticulitis with some left lower quadrant abdominal pain. Was placed initially on Cipro and Flagyl but could not tolerate Flagyl. Initially improved somewhat but had recurrence of some pain and was recently started on Avelox currently day 9. Abdominal pain is improved. He has not had any scans to confirm diverticulitis history. Denies any fever or chills. No burning with urination.  Patient had gross hematuria around 3 AM. Subsequently urinated small clot. Takes aspirin but no other anticoagulants. PSA 0.96 last March. Patient has renal insufficiency with recent creatinine 2.4. Needs repeat. Patient states his father died of kidney failure. He was a diabetic but not sure of etiology.  Patient had abdominal ultrasound April 2010 with comment of multiple renal cysts but no renal mass otherwise. No recent appetite or weight changes.  No known FH of Polycystic Kidney Disease.  Past Medical History  Diagnosis Date  . GERD 12/07/2006  . HYPERGLYCEMIA 12/07/2006  . HYPERLIPIDEMIA 12/07/2006  . HYPERTENSION 12/07/2006  . OBESITY 11/10/2009  . SLEEP APNEA 10/22/2007  . THROMBOCYTOPENIA 12/07/2006  . TRANSAMINASES, SERUM, ELEVATED 12/07/2006  . VENTRICULAR TACHYCARDIA 03/27/2007   Past Surgical History  Procedure Date  . Cholecystectomy     reports that he quit smoking about 30 years ago. He does not have any smokeless tobacco history on file. His alcohol and drug histories not on file. family history includes Diabetes in his father and mother; Heart failure in his mother; and Kidney failure in his father. Allergies  Allergen Reactions  . Lisinopril     Renal insufficiency      Review of Systems  Constitutional: Negative for fever, chills, appetite  change and unexpected weight change.  Gastrointestinal: Negative for abdominal pain, blood in stool and abdominal distention.  Genitourinary: Positive for hematuria. Negative for flank pain, decreased urine volume and testicular pain.  Musculoskeletal: Negative for back pain.       Objective:   Physical Exam  Constitutional: He appears well-developed and well-nourished.  Neck: Neck supple.  Cardiovascular: Normal rate and regular rhythm.   Pulmonary/Chest: Effort normal and breath sounds normal. No respiratory distress. He has no wheezes. He has no rales.  Abdominal: Soft. Bowel sounds are normal. He exhibits no distension and no mass. There is no tenderness. There is no rebound and no guarding.  Lymphadenopathy:    He has no cervical adenopathy.          Assessment & Plan:  Gross hematuria. Infection very unlikely. Only trace leukocytes on urine today. No history of kidney stones.  He has history of chronic kidney disease presumably secondary to hypertension and history of comment some bilateral multiple renal cysts of uncertain significance on ultrasound 2010. Family history of kidney failure in father -of uncertain etiology. Patient needs further evaluation. Urine culture sent. Urology referral Repeat basic metabolic panel today

## 2011-07-28 LAB — URINE CULTURE
Colony Count: NO GROWTH
Organism ID, Bacteria: NO GROWTH

## 2011-07-28 NOTE — Progress Notes (Signed)
Quick Note:  Pt informed on home VM ______ 

## 2011-07-29 ENCOUNTER — Other Ambulatory Visit: Payer: Medicare Other

## 2011-08-09 ENCOUNTER — Encounter (HOSPITAL_COMMUNITY): Payer: Self-pay | Admitting: *Deleted

## 2011-08-09 ENCOUNTER — Other Ambulatory Visit: Payer: Self-pay | Admitting: Urology

## 2011-08-09 NOTE — Pre-Procedure Instructions (Signed)
Patient to arrive in [place] at 1000am Monday 08/15/11. Npo after midnight for solid food then may have clear liquid at 0600 then npo except meds with a sip. To follow laxative instructions in blue folder. To arrive in [place] with driver, blue folder, bipap,insurance info, picture ID. Patient has stopped taking Aspirin, vitamins and nispan until after the litho

## 2011-08-10 ENCOUNTER — Encounter (HOSPITAL_COMMUNITY): Payer: Self-pay | Admitting: *Deleted

## 2011-08-10 ENCOUNTER — Encounter (HOSPITAL_COMMUNITY): Payer: Self-pay | Admitting: Pharmacy Technician

## 2011-08-15 ENCOUNTER — Ambulatory Visit (HOSPITAL_COMMUNITY): Payer: Medicare Other

## 2011-08-15 ENCOUNTER — Encounter (HOSPITAL_COMMUNITY)
Admission: RE | Disposition: A | Discharge status: Home / Self Care | Payer: Self-pay | Source: Ambulatory Visit | Attending: Urology

## 2011-08-15 ENCOUNTER — Ambulatory Visit (HOSPITAL_COMMUNITY)
Admission: RE | Admit: 2011-08-15 | Discharge: 2011-08-15 | Disposition: A | Discharge status: Home / Self Care | Payer: Medicare Other | Source: Ambulatory Visit | Attending: Urology | Admitting: Urology

## 2011-08-15 ENCOUNTER — Encounter (HOSPITAL_COMMUNITY): Payer: Self-pay

## 2011-08-15 DIAGNOSIS — Z538 Procedure and treatment not carried out for other reasons: Secondary | ICD-10-CM | POA: Insufficient documentation

## 2011-08-15 DIAGNOSIS — N201 Calculus of ureter: Secondary | ICD-10-CM | POA: Insufficient documentation

## 2011-08-15 DIAGNOSIS — D696 Thrombocytopenia, unspecified: Secondary | ICD-10-CM | POA: Insufficient documentation

## 2011-08-15 DIAGNOSIS — K219 Gastro-esophageal reflux disease without esophagitis: Secondary | ICD-10-CM | POA: Insufficient documentation

## 2011-08-15 DIAGNOSIS — N1330 Unspecified hydronephrosis: Secondary | ICD-10-CM | POA: Insufficient documentation

## 2011-08-15 DIAGNOSIS — I10 Essential (primary) hypertension: Secondary | ICD-10-CM | POA: Insufficient documentation

## 2011-08-15 DIAGNOSIS — E785 Hyperlipidemia, unspecified: Secondary | ICD-10-CM | POA: Insufficient documentation

## 2011-08-15 DIAGNOSIS — Z7982 Long term (current) use of aspirin: Secondary | ICD-10-CM | POA: Insufficient documentation

## 2011-08-15 DIAGNOSIS — E669 Obesity, unspecified: Secondary | ICD-10-CM | POA: Insufficient documentation

## 2011-08-15 SURGERY — LITHOTRIPSY, ESWL
Anesthesia: LOCAL | Laterality: Right

## 2011-08-15 MED ORDER — DIAZEPAM 5 MG PO TABS
ORAL_TABLET | ORAL | Status: AC
  Administered 2011-08-15: 10 mg via ORAL
  Filled 2011-08-15: qty 2

## 2011-08-15 MED ORDER — DEXTROSE-NACL 5-0.45 % IV SOLN
INTRAVENOUS | Status: DC
  Administered 2011-08-15: 11:00:00 via INTRAVENOUS

## 2011-08-15 MED ORDER — DIPHENHYDRAMINE HCL 25 MG PO CAPS
25.0000 mg | ORAL_CAPSULE | ORAL | Status: AC
  Administered 2011-08-15: 25 mg via ORAL

## 2011-08-15 MED ORDER — CIPROFLOXACIN HCL 500 MG PO TABS
500.0000 mg | ORAL_TABLET | ORAL | Status: AC
  Administered 2011-08-15: 500 mg via ORAL

## 2011-08-15 MED ORDER — CIPROFLOXACIN HCL 500 MG PO TABS
ORAL_TABLET | ORAL | Status: AC
  Administered 2011-08-15: 500 mg via ORAL
  Filled 2011-08-15: qty 1

## 2011-08-15 MED ORDER — DIPHENHYDRAMINE HCL 25 MG PO CAPS
ORAL_CAPSULE | ORAL | Status: AC
  Administered 2011-08-15: 25 mg via ORAL
  Filled 2011-08-15: qty 1

## 2011-08-15 MED ORDER — DIAZEPAM 5 MG PO TABS
10.0000 mg | ORAL_TABLET | ORAL | Status: AC
  Administered 2011-08-15: 10 mg via ORAL

## 2011-08-15 NOTE — H&P (Signed)
History and Physical  Chief Complaint: Right flank pain  History of Present Illness: Mr Novosad was seen last month for gross hematuria.  CT scan revealed moderate right hydronephrosis secondary to a 7 mm proximal right ureteral calculus.  Cystoscopy showed no bladder stone or tumor.  Treatment options of the ureteral calculus were discussed with him and his wife.  They elected to proceed with ESL.  The risks, benefits were discussed with them.  The risks include but are not limited to hemorrhage, renal or perirenal hematoma, injury to adjacent organs, steinstrasse, inability to fragment the stone.  They understand and gave informed consent.  Past Medical History  Diagnosis Date  . GERD 12/07/2006  . HYPERGLYCEMIA 12/07/2006  . HYPERLIPIDEMIA 12/07/2006  . HYPERTENSION 12/07/2006  . OBESITY 11/10/2009  . SLEEP APNEA 10/22/2007  . THROMBOCYTOPENIA 12/07/2006  . TRANSAMINASES, SERUM, ELEVATED 12/07/2006  . VENTRICULAR TACHYCARDIA 03/27/2007   Past Surgical History  Procedure Date  . Cholecystectomy   . Rotator cuff repair     Medications: Amlodipine, Carvedilol, Furosemide, Prilosec, Zyrtec, Aspirin, Vit D, Multivitamins. Allergies:  Allergies  Allergen Reactions  . Lisinopril     Renal insufficiency    Family History  Problem Relation Age of Onset  . Diabetes Mother   . Heart failure Mother   . Kidney failure Father   . Diabetes Father    Social History:  reports that he quit smoking about 31 years ago. He quit smokeless tobacco use about 7 years ago. His smokeless tobacco use included Chew. He reports that he does not drink alcohol or use illicit drugs.  ROS: All systems are reviewed and negative except as noted.   Physical Exam:  Vital signs in last 24 hours: Temp:  [97.3 F (36.3 C)] 97.3 F (36.3 C) (03/11 0953) Pulse Rate:  [64] 64  (03/11 0953) Resp:  [20] 20  (03/11 0953) BP: (161)/(86) 161/86 mmHg (03/11 0953) SpO2:  [97 %] 97 % (03/11 0953)  Cardiovascular: Skin warm;  not flushed Respiratory: Breaths quiet; no shortness of breath Abdomen: No masses Neurological: Normal sensation to touch Musculoskeletal: Normal motor function arms and legs Lymphatics: No inguinal adenopathy Skin: No rashes Genitourinary:Penis and scrotal contents are within normal limits.  Rectal: Prostate enlarged, 40 gm, no nodules.    Impression/Assessment:  Right proximal ureteral calculus, right hydronephrosis.  Plan:  ESL.  Deangelo Berns-HENRY 08/15/2011, 11:11 AM

## 2011-08-15 NOTE — Op Note (Signed)
Procedure was canceled.  The stone could not be seen on KUB. Will repeat CT scan and discuss other treatment options with the patient and his wife depending on the CT scan results.

## 2011-08-15 NOTE — Progress Notes (Signed)
Procedure for ESWL cancelled as MD could not visualize stone on KUB. Pt taken to [facility] for CAT scan with wife in [place].

## 2011-08-16 ENCOUNTER — Other Ambulatory Visit: Payer: Self-pay | Admitting: Urology

## 2011-08-16 ENCOUNTER — Encounter (HOSPITAL_COMMUNITY): Payer: Self-pay

## 2011-08-16 ENCOUNTER — Encounter (HOSPITAL_BASED_OUTPATIENT_CLINIC_OR_DEPARTMENT_OTHER): Payer: Self-pay | Admitting: *Deleted

## 2011-08-16 NOTE — Progress Notes (Addendum)
Pt instructed npo/ mn 3/14 x bp meds and prilosec w sip of water.  To [facility] 3/15 @ 0800.  Needs istat, ekg on arrival. Pt to bring bipap. Ekg, last office note, cardiac studies requested from Dr. Evette Georges office.

## 2011-08-18 NOTE — H&P (Signed)
History of Present Illness  [name] was scheduled for ESL of  a proximal right ureteral calculus.  The stone was not seen on KUB and under fluoroscopy.  The procedure was canceled.  Repeat CT scan shows the stone in the stone location.  I discussed with the patient and his wife about treaatment options at this time: Insertion of ureteral catheter for visualiation of the stone with ESL or ureteroscopy with holmium laser of the ureteral calculus.  The procedure, risks, benefits were discussed with them.  They elected to proceed with ureteroscopy.   Past Medical History Problems  1. History of  Adult Sleep Apnea 780.57 2. History of  Heartburn 787.1 3. History of  Hypertension 401.9 4. History of  Sinus Arrhythmia 427.9  Surgical History Problems  1. History of  Cholecystectomy 2. History of  Shoulder Surgery Right  Current Meds 1. AmLODIPine Besylate 5 MG Oral Tablet; Therapy: (Recorded:26Feb2013) to 2. Aspirin 325 MG Oral Tablet; Therapy: (Recorded:26Feb2013) to 3. Carvedilol 25 MG Oral Tablet; Therapy: (Recorded:26Feb2013) to 4. Furosemide 20 MG Oral Tablet; Therapy: (Recorded:26Feb2013) to 5. Multi-Vitamin TABS; Therapy: (Recorded:26Feb2013) to 6. Omega 3 CAPS; Therapy: (Recorded:26Feb2013) to 7. PriLOSEC OTC 20 MG Oral Tablet Delayed Release; Therapy: (Recorded:26Feb2013) to 8. Vitamin D TABS; Therapy: (Recorded:26Feb2013) to 9. ZyrTEC Allergy TABS; Therapy: (Recorded:26Feb2013) to  Allergies Medication  1. No Known Drug Allergies  Family History Problems  1. Paternal history of  Diabetes Mellitus V18.0 2. Maternal history of  Family Health Status Number Of Children 2 sons/ 2 daugh 3. Maternal history of  Father Deceased At Age ____ Renal failure 4. Paternal history of  Heart Disease V17.49 5. Maternal history of  Mother Deceased At Age 46 Heart disease 6. Family history of  Nephrolithiasis 7. Paternal history of  Nephrolithiasis 8. Family history of  Renal  Failure  Social History Problems  1. Marital History - Currently Married 2. Tobacco Use 305.1 1 pk for 10 yrs / quit 30 yrs ago Denied  3. History of  Alcohol Use 4. History of  Caffeine Use  Review of Systems  As per HPI.  Everything else is negative.     Physical Exam Constitutional: Well nourished and well developed . No acute distress.  ENT:. The ears and nose are normal in appearance.  Neck: The appearance of the neck is normal and no neck mass is present.  Pulmonary: No respiratory distress and normal respiratory rhythm and effort.  Cardiovascular: Heart rate and rhythm are normal . No peripheral edema.  Abdomen: The abdomen is soft and nontender. No masses are palpated. No CVA tenderness. No hernias are palpable. No hepatosplenomegaly noted.  Genitourinary: Examination of the penis demonstrates no discharge, no masses, no lesions and a normal meatus. The scrotum is without lesions. The right epididymis is palpably normal and non-tender. The left epididymis is palpably normal and non-tender. The right testis is non-tender and without masses. The left testis is non-tender and without masses.  Lymphatics: The femoral and inguinal nodes are not enlarged or tender.  Skin: Normal skin turgor, no visible rash and no visible skin lesions.  Neuro/Psych:. Mood and affect are appropriate.    Assessment Assessed  1. Proximal Ureteral Stone On The Right 592.1 2. Hydronephrosis 591 3. Benign Prostatic Hyperplasia Localized With Urinary Obstruction With Other Lower Urinary Tract  Symptoms 600.21  Plan  Cystoscopy,ureteroscopy, holmium laser of the ureteral calculus, insertion of JJ stent.  The risks of the procedure include but are not limited to hemorrhage, infection,  inability to fragment or extract the stone, ureteral injury. They understand and are agreeable.   Signatures Electronically signed by : Lowella Bandy, M.D.; Aug 15 2011  5:57PM

## 2011-08-19 ENCOUNTER — Encounter (HOSPITAL_BASED_OUTPATIENT_CLINIC_OR_DEPARTMENT_OTHER)
Admission: RE | Disposition: A | Discharge status: Home / Self Care | Payer: Self-pay | Source: Ambulatory Visit | Attending: Urology

## 2011-08-19 ENCOUNTER — Encounter (HOSPITAL_BASED_OUTPATIENT_CLINIC_OR_DEPARTMENT_OTHER): Payer: Self-pay | Admitting: Anesthesiology

## 2011-08-19 ENCOUNTER — Ambulatory Visit (HOSPITAL_BASED_OUTPATIENT_CLINIC_OR_DEPARTMENT_OTHER)
Admission: RE | Admit: 2011-08-19 | Discharge: 2011-08-19 | Disposition: A | Discharge status: Home / Self Care | Payer: Medicare Other | Source: Ambulatory Visit | Attending: Urology | Admitting: Urology

## 2011-08-19 ENCOUNTER — Ambulatory Visit (HOSPITAL_BASED_OUTPATIENT_CLINIC_OR_DEPARTMENT_OTHER): Payer: Medicare Other | Admitting: Anesthesiology

## 2011-08-19 ENCOUNTER — Encounter (HOSPITAL_BASED_OUTPATIENT_CLINIC_OR_DEPARTMENT_OTHER): Payer: Self-pay | Admitting: *Deleted

## 2011-08-19 ENCOUNTER — Other Ambulatory Visit: Payer: Self-pay

## 2011-08-19 DIAGNOSIS — N1330 Unspecified hydronephrosis: Secondary | ICD-10-CM | POA: Insufficient documentation

## 2011-08-19 DIAGNOSIS — I10 Essential (primary) hypertension: Secondary | ICD-10-CM | POA: Insufficient documentation

## 2011-08-19 DIAGNOSIS — N401 Enlarged prostate with lower urinary tract symptoms: Secondary | ICD-10-CM | POA: Insufficient documentation

## 2011-08-19 DIAGNOSIS — Z7982 Long term (current) use of aspirin: Secondary | ICD-10-CM | POA: Insufficient documentation

## 2011-08-19 DIAGNOSIS — G4730 Sleep apnea, unspecified: Secondary | ICD-10-CM | POA: Insufficient documentation

## 2011-08-19 DIAGNOSIS — N201 Calculus of ureter: Secondary | ICD-10-CM | POA: Insufficient documentation

## 2011-08-19 DIAGNOSIS — Z79899 Other long term (current) drug therapy: Secondary | ICD-10-CM | POA: Insufficient documentation

## 2011-08-19 HISTORY — PX: CYSTOSCOPY/RETROGRADE/URETEROSCOPY: SHX5316

## 2011-08-19 LAB — POCT I-STAT, CHEM 8
BUN: 30 mg/dL — ABNORMAL HIGH (ref 6–23)
Calcium, Ion: 1.29 mmol/L (ref 1.12–1.32)
Chloride: 110 mEq/L (ref 96–112)
Creatinine, Ser: 2.1 mg/dL — ABNORMAL HIGH (ref 0.50–1.35)
Glucose, Bld: 105 mg/dL — ABNORMAL HIGH (ref 70–99)
HCT: 34 % — ABNORMAL LOW (ref 39.0–52.0)
Hemoglobin: 11.6 g/dL — ABNORMAL LOW (ref 13.0–17.0)
Potassium: 4.9 mEq/L (ref 3.5–5.1)
Sodium: 146 mEq/L — ABNORMAL HIGH (ref 135–145)
TCO2: 25 mmol/L (ref 0–100)

## 2011-08-19 SURGERY — CYSTOSCOPY/RETROGRADE/URETEROSCOPY
Anesthesia: General | Site: Ureter | Laterality: Right | Wound class: Clean Contaminated

## 2011-08-19 MED ORDER — EPHEDRINE SULFATE 50 MG/ML IJ SOLN
INTRAMUSCULAR | Status: DC | PRN
  Administered 2011-08-19: 10 mg via INTRAVENOUS

## 2011-08-19 MED ORDER — ONDANSETRON HCL 4 MG/2ML IJ SOLN
INTRAMUSCULAR | Status: DC | PRN
  Administered 2011-08-19: 4 mg via INTRAVENOUS

## 2011-08-19 MED ORDER — LACTATED RINGERS IV SOLN
INTRAVENOUS | Status: DC
  Administered 2011-08-19 (×2): via INTRAVENOUS

## 2011-08-19 MED ORDER — SODIUM CHLORIDE 0.9 % IR SOLN
Status: DC | PRN
  Administered 2011-08-19: 6000 mL

## 2011-08-19 MED ORDER — FENTANYL CITRATE 0.05 MG/ML IJ SOLN
INTRAMUSCULAR | Status: DC | PRN
  Administered 2011-08-19: 25 ug via INTRAVENOUS
  Administered 2011-08-19: 100 ug via INTRAVENOUS
  Administered 2011-08-19: 25 ug via INTRAVENOUS

## 2011-08-19 MED ORDER — CEFAZOLIN SODIUM 1-5 GM-% IV SOLN
INTRAVENOUS | Status: DC | PRN
  Administered 2011-08-19: 2 g via INTRAVENOUS

## 2011-08-19 MED ORDER — PROPOFOL 10 MG/ML IV EMUL
INTRAVENOUS | Status: DC | PRN
  Administered 2011-08-19: 300 mg via INTRAVENOUS

## 2011-08-19 MED ORDER — HYDROCODONE-ACETAMINOPHEN 5-500 MG PO CAPS: 1.0000 | ORAL_CAPSULE | Freq: Four times a day (QID) | ORAL | Status: AC | PRN

## 2011-08-19 MED ORDER — CEFAZOLIN SODIUM 1-5 GM-% IV SOLN: 1.0000 g | INTRAVENOUS | Status: DC

## 2011-08-19 MED ORDER — KETOROLAC TROMETHAMINE 30 MG/ML IJ SOLN
INTRAMUSCULAR | Status: DC | PRN
  Administered 2011-08-19: 30 mg via INTRAVENOUS

## 2011-08-19 MED ORDER — LIDOCAINE HCL (CARDIAC) 20 MG/ML IV SOLN
INTRAVENOUS | Status: DC | PRN
  Administered 2011-08-19: 100 mg via INTRAVENOUS

## 2011-08-19 MED ORDER — FENTANYL CITRATE 0.05 MG/ML IJ SOLN: 25.0000 ug | INTRAMUSCULAR | Status: DC | PRN

## 2011-08-19 MED ORDER — DEXAMETHASONE SODIUM PHOSPHATE 4 MG/ML IJ SOLN
INTRAMUSCULAR | Status: DC | PRN
  Administered 2011-08-19: 4 mg via INTRAVENOUS

## 2011-08-19 MED ORDER — LIDOCAINE HCL 2 % EX GEL
CUTANEOUS | Status: DC | PRN
  Administered 2011-08-19: 1 via URETHRAL

## 2011-08-19 MED ORDER — LACTATED RINGERS IV SOLN
INTRAVENOUS | Status: DC | PRN
  Administered 2011-08-19: 09:00:00 via INTRAVENOUS

## 2011-08-19 MED ORDER — IOHEXOL 350 MG/ML SOLN
INTRAVENOUS | Status: DC | PRN
  Administered 2011-08-19: 50 mL via INTRAVENOUS

## 2011-08-19 SURGICAL SUPPLY — 41 items
ADAPTER CATH URET PLST 4-6FR (CATHETERS) ×1 IMPLANT
ADPR CATH URET STRL DISP 4-6FR (CATHETERS) ×1
BAG DRAIN URO-CYSTO SKYTR STRL (DRAIN) ×2 IMPLANT
BAG DRN UROCATH (DRAIN) ×1
BASKET LASER NITINOL 1.9FR (BASKET) IMPLANT
BASKET STNLS GEMINI 4WIRE 3FR (BASKET) IMPLANT
BASKET ZERO TIP NITINOL 2.4FR (BASKET) ×1 IMPLANT
BRUSH URET BIOPSY 3F (UROLOGICAL SUPPLIES) IMPLANT
BSKT STON RTRVL 120 1.9FR (BASKET)
BSKT STON RTRVL GEM 120X11 3FR (BASKET)
BSKT STON RTRVL ZERO TP 2.4FR (BASKET) ×1
CANISTER SUCT LVC 12 LTR MEDI- (MISCELLANEOUS) ×1 IMPLANT
CATH CLEAR GEL 3F BACKSTOP (CATHETERS) ×1 IMPLANT
CATH INTERMIT  6FR 70CM (CATHETERS) IMPLANT
CATH URET 5FR 28IN CONE TIP (BALLOONS)
CATH URET 5FR 28IN OPEN ENDED (CATHETERS) IMPLANT
CATH URET 5FR 70CM CONE TIP (BALLOONS) IMPLANT
CLOTH BEACON ORANGE TIMEOUT ST (SAFETY) ×2 IMPLANT
DRAPE CAMERA CLOSED 9X96 (DRAPES) ×2 IMPLANT
ELECT REM PT RETURN 9FT ADLT (ELECTROSURGICAL)
ELECTRODE REM PT RTRN 9FT ADLT (ELECTROSURGICAL) IMPLANT
GLOVE BIO SURGEON STRL SZ7 (GLOVE) ×2 IMPLANT
GLOVE INDICATOR 6.5 STRL GRN (GLOVE) ×2 IMPLANT
GOWN STRL NON-REIN LRG LVL3 (GOWN DISPOSABLE) ×3 IMPLANT
GUIDEWIRE 0.038 PTFE COATED (WIRE) IMPLANT
GUIDEWIRE ANG ZIPWIRE 038X150 (WIRE) IMPLANT
GUIDEWIRE STR DUAL SENSOR (WIRE) ×1 IMPLANT
IV NS IRRIG 3000ML ARTHROMATIC (IV SOLUTION) ×4 IMPLANT
KIT BALLIN UROMAX 15FX10 (LABEL) IMPLANT
KIT BALLN UROMAX 15FX4 (MISCELLANEOUS) IMPLANT
KIT BALLN UROMAX 26 75X4 (MISCELLANEOUS)
LASER FIBER DISP (UROLOGICAL SUPPLIES) ×1 IMPLANT
LASER FIBER DISP 1000U (UROLOGICAL SUPPLIES) IMPLANT
PACK CYSTOSCOPY (CUSTOM PROCEDURE TRAY) ×2 IMPLANT
SET HIGH PRES BAL DIL (LABEL)
SHEATH ACCESS URETERAL 38CM (SHEATH) ×1 IMPLANT
SHEATH URET ACCESS 12FR/35CM (UROLOGICAL SUPPLIES) IMPLANT
SHEATH URET ACCESS 12FR/55CM (UROLOGICAL SUPPLIES) IMPLANT
STENT URET 6FRX26 CONTOUR (STENTS) ×1 IMPLANT
SYRINGE IRR TOOMEY STRL 70CC (SYRINGE) ×1 IMPLANT
WATER STERILE IRR 500ML POUR (IV SOLUTION) ×1 IMPLANT

## 2011-08-19 NOTE — Anesthesia Postprocedure Evaluation (Signed)
Anesthesia Post Note  Patient: Martin Cordova  Procedure(s) Performed: Procedure(s) (LRB): CYSTOSCOPY/RETROGRADE/URETEROSCOPY (Right) HOLMIUM LASER APPLICATION (Right)  Anesthesia type: General  Patient location: PACU  Post pain: Pain level controlled  Post assessment: Post-op Vital signs reviewed  Last Vitals:  Filed Vitals:   08/19/11 1105  BP: 131/66  Pulse: 61  Temp: 36.1 C  Resp: 15    Post vital signs: Reviewed  Level of consciousness: sedated  Complications: No apparent anesthesia complications

## 2011-08-19 NOTE — Anesthesia Preprocedure Evaluation (Signed)
Anesthesia Evaluation  Patient identified by MRN, date of birth, ID band Patient awake    Reviewed: Allergy & Precautions, H&P , NPO status , Patient's Chart, lab work & pertinent test results, reviewed documented beta blocker date and time   Airway Mallampati: II TM Distance: >3 FB     Dental  (+) Teeth Intact and Dental Advisory Given   Pulmonary sleep apnea ,  Rx-Bipap breath sounds clear to auscultation        Cardiovascular hypertension, Pt. on medications + dysrhythmias Rhythm:Regular Rate:Normal  Hx tachy-arrhythmia, Coreg-Rx Cards office visit Jan 2013, stable exam   Neuro/Psych negative neurological ROS  negative psych ROS   GI/Hepatic negative GI ROS, Neg liver ROS,   Endo/Other  Obesity  Renal/GU Kidney stone  negative genitourinary   Musculoskeletal negative musculoskeletal ROS (+)   Abdominal   Peds negative pediatric ROS (+)  Hematology negative hematology ROS (+)   Anesthesia Other Findings   Reproductive/Obstetrics negative OB ROS                           Anesthesia Physical Anesthesia Plan  ASA: III  Anesthesia Plan: General   Post-op Pain Management:    Induction: Intravenous  Airway Management Planned: LMA  Additional Equipment:   Intra-op Plan:   Post-operative Plan: Extubation in OR  Informed Consent: I have reviewed the patients History and Physical, chart, labs and discussed the procedure including the risks, benefits and alternatives for the proposed anesthesia with the patient or authorized representative who has indicated his/her understanding and acceptance.   Dental advisory given  Plan Discussed with: CRNA and Surgeon  Anesthesia Plan Comments:         Anesthesia Quick Evaluation

## 2011-08-19 NOTE — Anesthesia Procedure Notes (Signed)
Procedure Name: LMA Insertion Date/Time: 08/19/2011 9:45 AM Performed by: Justice Rocher Pre-anesthesia Checklist: Patient identified, Emergency Drugs available, Suction available and Patient being monitored Patient Re-evaluated:Patient Re-evaluated prior to inductionOxygen Delivery Method: Circle System Utilized Preoxygenation: Pre-oxygenation with 100% oxygen Intubation Type: IV induction Ventilation: Mask ventilation without difficulty LMA: LMA with gastric port inserted LMA Size: 5.0 Number of attempts: 1 Placement Confirmation: positive ETCO2 Tube secured with: Tape Dental Injury: Teeth and Oropharynx as per pre-operative assessment

## 2011-08-19 NOTE — Op Note (Signed)
Yida Medor Villella is a 71 y.o.   08/19/2011  Pre-op diagnosis: Right ureteral calculus.  Postop diagnosis: Same.  Procedure done: Cystoscopy, right retrograde pyelogram, ureteroscopy with holmium laser of the ureteral calculus, stone extraction and insertion of double-J stent.  Surgeon: Charlene Brooke. Rainie Crenshaw  Anesthesia: Gen.   Indication: Patient is a 71 years old male who had been complaining of right flank pain. CT scan showed a 6 x 7 mm proximal ureteral calculus. Patient was scheduled for ESL. The stone could not be seen on KUB nor under fluoroscopy. ESL was canceled. Repeat CT scan showed the stone in the same location. He is scheduled today for cystoscopy, retrograde pyelogram, ureteroscopy with holmium laser.  Procedure: The patient was identified by his wrist band and proper timeout was taken.  Under general anesthesia he was prepped and draped and placed in the dorsolithotomy position. A panendoscope was inserted in the bladder. The anterior urethra is normal. There is moderate prostatic hypertrophy. The bladder mucosa is normal. There is no stone or tumor in the bladder. The ureteral orifices are in normal position and shape.  Retrograde pyelogram:  A cone-tip catheter was passed through the cystoscope and the right ureteral orifice. Contrast was then injected through the cone-tip catheter. The distal and mid ureter appear normal. There is a filling defect in the proximal ureter at L4-L5. Contrast could not fill the ureter proximal to the filling defect. The cone-tip catheter was then removed. A sensor wire was passed through the cystoscope and the right ureter up to the renal pelvis. The cystoscope was removed.  A semirigid ureteroscope was then passed in the bladder but could not pass through the intramural ureter. The ureteroscope was removed. The intramural ureter was then dilated with a ureteroscope access sheath. The access sheath was then removed. The ureteroscope was then reinserted  in the bladder and was advanced without difficulty up to the level of the stone. There was some edema at the site of the stone.  With a 365 microfiber holmium laser the stone was fragmented into multiple stone fragments. The fragments were then removed with a Nitinol basket. There are some minute fragments in the ureter. Those fragments are small enough for the patient to pass them spontaneously.  Contrast was then again injected through the ureteroscope. There is no extravasation of contrast. The ureteroscope was then removed under direct vision.  The sensor wire was then backloaded into the cystoscope and a #[id number] double-J stent was passed over the wire. The wire was then removed. The proximal curl of the double-J stent is in the renal pelvis; the distal end is in the bladder. The bladder was then emptied and the cystoscope removed.  The patient tolerated the procedure well and left the OR in satisfactory condition to postanesthesia care unit.

## 2011-08-19 NOTE — Transfer of Care (Signed)
Immediate Anesthesia Transfer of Care Note  Patient: Martin Cordova  Procedure(s) Performed: Procedure(s) (LRB): CYSTOSCOPY/RETROGRADE/URETEROSCOPY (Right) HOLMIUM LASER APPLICATION (Right)  Patient Location: PACU  Anesthesia Type: General  Level of Consciousness: awake, sedated, patient cooperative and responds to stimulation  Airway & Oxygen Therapy: Patient Spontanous Breathing and Patient connected to face mask oxygen  Post-op Assessment: Report given to PACU RN, Post -op Vital signs reviewed and stable and Patient moving all extremities  Post vital signs: Reviewed and stable  Complications: No apparent anesthesia complications

## 2011-08-22 ENCOUNTER — Encounter (HOSPITAL_BASED_OUTPATIENT_CLINIC_OR_DEPARTMENT_OTHER): Payer: Self-pay | Admitting: Urology

## 2011-12-05 ENCOUNTER — Other Ambulatory Visit: Payer: Self-pay | Admitting: Internal Medicine

## 2012-05-07 ENCOUNTER — Other Ambulatory Visit: Payer: Self-pay | Admitting: Internal Medicine

## 2012-05-07 ENCOUNTER — Ambulatory Visit (INDEPENDENT_AMBULATORY_CARE_PROVIDER_SITE_OTHER): Payer: Medicare Other | Admitting: Family Medicine

## 2012-05-07 ENCOUNTER — Encounter: Payer: Self-pay | Admitting: Family Medicine

## 2012-05-07 VITALS — BP 136/74 | HR 67 | Temp 98.5°F | Wt 250.0 lb

## 2012-05-07 DIAGNOSIS — J329 Chronic sinusitis, unspecified: Secondary | ICD-10-CM

## 2012-05-07 MED ORDER — AZITHROMYCIN 250 MG PO TABS: ORAL_TABLET | ORAL | Status: DC

## 2012-05-07 NOTE — Progress Notes (Signed)
  Subjective:    Patient ID: Martin Cordova, male    DOB: [birth year], 71 y.o.   MRN: [id number]  HPI Here for 3 weeks of sinus pressure, PND, HA, and a dry cough. No fever. On Mucinex.    Review of Systems  Constitutional: Negative.   HENT: Positive for congestion, postnasal drip and sinus pressure.   Eyes: Negative.   Respiratory: Positive for cough.        Objective:   Physical Exam  Constitutional: He appears well-developed and well-nourished.  HENT:  Right Ear: External ear normal.  Left Ear: External ear normal.  Nose: Nose normal.  Mouth/Throat: Oropharynx is clear and moist. No oropharyngeal exudate.  Eyes: Conjunctivae normal are normal.  Pulmonary/Chest: Effort normal and breath sounds normal.  Lymphadenopathy:    He has no cervical adenopathy.          Assessment & Plan:  Recheck prn

## 2012-07-03 ENCOUNTER — Ambulatory Visit (INDEPENDENT_AMBULATORY_CARE_PROVIDER_SITE_OTHER): Payer: Medicare Other | Admitting: Internal Medicine

## 2012-07-03 ENCOUNTER — Encounter: Payer: Self-pay | Admitting: Internal Medicine

## 2012-07-03 VITALS — BP 155/95 | HR 68 | Temp 97.9°F | Ht 72.0 in | Wt 250.0 lb

## 2012-07-03 DIAGNOSIS — E785 Hyperlipidemia, unspecified: Secondary | ICD-10-CM

## 2012-07-03 DIAGNOSIS — I10 Essential (primary) hypertension: Secondary | ICD-10-CM

## 2012-07-03 DIAGNOSIS — R7401 Elevation of levels of liver transaminase levels: Secondary | ICD-10-CM

## 2012-07-03 DIAGNOSIS — R7989 Other specified abnormal findings of blood chemistry: Secondary | ICD-10-CM

## 2012-07-03 DIAGNOSIS — R7402 Elevation of levels of lactic acid dehydrogenase (LDH): Secondary | ICD-10-CM

## 2012-07-03 DIAGNOSIS — Z0000 Encounter for general adult medical examination without abnormal findings: Secondary | ICD-10-CM

## 2012-07-03 DIAGNOSIS — N289 Disorder of kidney and ureter, unspecified: Secondary | ICD-10-CM

## 2012-07-03 DIAGNOSIS — K219 Gastro-esophageal reflux disease without esophagitis: Secondary | ICD-10-CM

## 2012-07-03 DIAGNOSIS — E669 Obesity, unspecified: Secondary | ICD-10-CM

## 2012-07-03 LAB — CBC WITH DIFFERENTIAL/PLATELET
Basophils Absolute: 0 10*3/uL (ref 0.0–0.1)
Basophils Relative: 0.4 % (ref 0.0–3.0)
Eosinophils Absolute: 0.1 10*3/uL (ref 0.0–0.7)
Eosinophils Relative: 2.3 % (ref 0.0–5.0)
HCT: 40.6 % (ref 39.0–52.0)
Hemoglobin: 13.6 g/dL (ref 13.0–17.0)
Lymphocytes Relative: 22.5 % (ref 12.0–46.0)
Lymphs Abs: 0.9 10*3/uL (ref 0.7–4.0)
MCHC: 33.5 g/dL (ref 30.0–36.0)
MCV: 90 fl (ref 78.0–100.0)
Monocytes Absolute: 0.4 10*3/uL (ref 0.1–1.0)
Monocytes Relative: 9.6 % (ref 3.0–12.0)
Neutro Abs: 2.7 10*3/uL (ref 1.4–7.7)
Neutrophils Relative %: 65.2 % (ref 43.0–77.0)
Platelets: 107 10*3/uL — ABNORMAL LOW (ref 150.0–400.0)
RBC: 4.51 Mil/uL (ref 4.22–5.81)
RDW: 14.1 % (ref 11.5–14.6)
WBC: 4.2 10*3/uL — ABNORMAL LOW (ref 4.5–10.5)

## 2012-07-03 LAB — HEPATIC FUNCTION PANEL
ALT: 56 U/L — ABNORMAL HIGH (ref 0–53)
AST: 34 U/L (ref 0–37)
Albumin: 4.2 g/dL (ref 3.5–5.2)
Alkaline Phosphatase: 39 U/L (ref 39–117)
Bilirubin, Direct: 0 mg/dL (ref 0.0–0.3)
Total Bilirubin: 0.8 mg/dL (ref 0.3–1.2)
Total Protein: 7.5 g/dL (ref 6.0–8.3)

## 2012-07-03 LAB — LIPID PANEL
Cholesterol: 155 mg/dL (ref 0–200)
HDL: 30.1 mg/dL — ABNORMAL LOW (ref >39.00)
Total CHOL/HDL Ratio: 5
Triglycerides: 211 mg/dL — ABNORMAL HIGH (ref 0.0–149.0)
VLDL: 42.2 mg/dL — ABNORMAL HIGH (ref 0.0–40.0)

## 2012-07-03 LAB — TSH: TSH: 2.02 u[IU]/mL (ref 0.35–5.50)

## 2012-07-03 LAB — BASIC METABOLIC PANEL
BUN: 27 mg/dL — ABNORMAL HIGH (ref 6–23)
CO2: 26 mEq/L (ref 19–32)
Calcium: 9.4 mg/dL (ref 8.4–10.5)
Chloride: 109 mEq/L (ref 96–112)
Creatinine, Ser: 1.5 mg/dL (ref 0.4–1.5)
GFR: 48.83 mL/min — ABNORMAL LOW (ref >60.00)
Glucose, Bld: 133 mg/dL — ABNORMAL HIGH (ref 70–99)
Potassium: 4.2 mEq/L (ref 3.5–5.1)
Sodium: 141 mEq/L (ref 135–145)

## 2012-07-03 LAB — HEMOGLOBIN A1C: Hgb A1c MFr Bld: 6.7 % — ABNORMAL HIGH (ref 4.6–6.5)

## 2012-07-03 LAB — LDL CHOLESTEROL, DIRECT: Direct LDL: 90.2 mg/dL

## 2012-07-03 NOTE — Addendum Note (Signed)
Addended by: Donnita Falls on: 07/03/2012 09:42 AM   Modules accepted: Orders

## 2012-07-03 NOTE — Assessment & Plan Note (Signed)
This is clearly his most significant medical problem He should concentrate on aggressive weight loss-

## 2012-07-03 NOTE — Assessment & Plan Note (Signed)
Will check labs today May need to suee nephrology Note regular f/u with urology

## 2012-07-03 NOTE — Progress Notes (Signed)
Subjective:    Martin Cordova is a 72 y.o. male who presents for Medicare Annual/Subsequent preventive examination.   Preventive Screening-Counseling & Management  Tobacco History  Smoking status  . Former Smoker -- 10 years  . Quit date: 08/04/1980  Smokeless tobacco  . Former Systems developer  . Types: Chew  . Quit date: 08/08/2004    Problems Prior to Visit 1.   Current Problems (verified) Patient Active Problem List  Diagnosis  . HYPERLIPIDEMIA  . OBESITY  . THROMBOCYTOPENIA  . HYPERTENSION  . VENTRICULAR TACHYCARDIA  . GERD  . SLEEP APNEA  . TRANSAMINASES, SERUM, ELEVATED  . HYPERGLYCEMIA  . Renal insufficiency    Medications Prior to Visit Current Outpatient Prescriptions on File Prior to Visit  Medication Sig Dispense Refill  . amLODipine (NORVASC) 5 MG tablet Take 5 mg by mouth daily after breakfast.      . aspirin 325 MG EC tablet Take 325 mg by mouth daily.      Marland Kitchen azithromycin (ZITHROMAX) 250 MG tablet As directed  6 tablet  0  . carvedilol (COREG) 25 MG tablet Take 37.5 mg by mouth at bedtime.      . cetirizine (ZYRTEC) 10 MG tablet Take 10 mg by mouth daily.      . Cholecalciferol (VITAMIN D3) 1000 UNITS CAPS Take 2,000 Units by mouth daily.       . furosemide (LASIX) 20 MG tablet Take 20 mg by mouth daily as needed. Swelling       . Multiple Vitamin (MULTIVITAMIN) tablet Take 1 tablet by mouth daily.        . niacin (NIASPAN) 500 MG CR tablet Take 500 mg by mouth at bedtime.       Marland Kitchen omeprazole (PRILOSEC) 20 MG capsule Take 1 tablet by mouth daily.      . sildenafil (VIAGRA) 100 MG tablet Take 100 mg by mouth daily as needed.          Current Medications (verified) Current Outpatient Prescriptions  Medication Sig Dispense Refill  . amLODipine (NORVASC) 5 MG tablet Take 5 mg by mouth daily after breakfast.      . aspirin 325 MG EC tablet Take 325 mg by mouth daily.      Marland Kitchen azithromycin (ZITHROMAX) 250 MG tablet As directed  6 tablet  0  . carvedilol (COREG)  25 MG tablet Take 37.5 mg by mouth at bedtime.      . cetirizine (ZYRTEC) 10 MG tablet Take 10 mg by mouth daily.      . Cholecalciferol (VITAMIN D3) 1000 UNITS CAPS Take 2,000 Units by mouth daily.       . furosemide (LASIX) 20 MG tablet Take 20 mg by mouth daily as needed. Swelling       . Multiple Vitamin (MULTIVITAMIN) tablet Take 1 tablet by mouth daily.        . niacin (NIASPAN) 500 MG CR tablet Take 500 mg by mouth at bedtime.       Marland Kitchen omeprazole (PRILOSEC) 20 MG capsule Take 1 tablet by mouth daily.      . sildenafil (VIAGRA) 100 MG tablet Take 100 mg by mouth daily as needed.           Allergies (verified) Lisinopril   PAST HISTORY  Family History Family History  Problem Relation Age of Onset  . Diabetes Mother   . Heart failure Mother   . Kidney failure Father   . Diabetes Father     Social History History  Substance Use Topics  . Smoking status: Former Smoker -- 10 years    Quit date: 08/04/1980  . Smokeless tobacco: Former Systems developer    Types: Chew    Quit date: 08/08/2004  . Alcohol Use: No    Are there smokers in your home (other than you)?  No  Risk Factors Current exercise habits: The patient does not participate in regular exercise at present.  Dietary issues discussed: he does not follow a low calorie diet   Cardiac risk factors: advanced age (older than 36 for men, 79 for women) and sedentary lifestyle.  Depression Screen See screening  Activities of Daily Living Patient is active at home and able to do all ADLs  Hearing Difficulties: No  Do you feel that you have a problem with memory? No   Cognitive Testing  Alert? Yes  Normal Appearance?Yes    List the Names of Other Physician/Practitioners you currently use: 1.    Indicate any recent Medical Services you may have received from other than [facility] providers in the past year (date may be approximate).  Immunization History  Administered Date(s) Administered  . Influenza Split 03/06/2012  .  Influenza Whole 03/19/2009, 03/06/2010  . Pneumococcal Polysaccharide 06/16/2008  . Td 01/25/1999, 07/06/2009    Screening Tests Health Maintenance  Topic Date Due  . Influenza Vaccine  02/04/2013  . Colonoscopy  09/05/2018  . Tetanus/tdap  07/07/2019  . Zostavax  09/23/2002  . Pneumococcal Polysaccharide Vaccine Age 98 And Over  Completed    All answers were reviewed with the patient and necessary referrals were made:  Chancy Hurter, MD   07/03/2012   History reviewed: allergies, current medications, past family history, past medical history, past social history, past surgical history and problem list  Review of Systems  patient denies chest pain, shortness of breath, orthopnea. Denies lower extremity edema, abdominal pain, change in appetite, change in bowel movements. Patient denies rashes, musculoskeletal complaints. No other specific complaints in a complete review of systems.    Objective:     reviewed vitals  well-developed well-nourished male in no acute distress. HEENT exam atraumatic, normocephalic, neck supple without jugular venous distention. Chest clear to auscultation cardiac exam S1-S2 are regular. Abdominal exam overweight with bowel sounds, soft and nontender. Extremities no edema. Neurologic exam is alert with a normal gait.     Assessment:     Well visit     Plan:     During the course of the visit the patient was educated and counseled about appropriate screening and preventive services including:    Pneumococcal vaccine   Influenza vaccine  Td vaccine  Prostate cancer screening  Colorectal cancer screening  Nutrition counseling   Discussed need for aggressive weight loss- he should concentrate on low calorie diet, he should avoid all animal fat and exercie daily  Diet review for nutrition referral? Yes ____  Not Indicated __x__   Patient Instructions (the written plan) was given to the patient.  Medicare Attestation I have personally  reviewed: The patient's medical and social history Their use of alcohol, tobacco or illicit drugs Their current medications and supplements The patient's functional ability including ADLs,fall risks, home safety risks, cognitive, and hearing and visual impairment Diet and physical activities Evidence for depression or mood disorders  The patient's weight, height, BMI, and visual acuity have been recorded in the chart.  I have made referrals, counseling, and provided education to the patient based on review of the above.   Chancy Hurter, MD  07/03/2012       

## 2012-07-03 NOTE — Assessment & Plan Note (Signed)
Check A1C today

## 2012-07-03 NOTE — Assessment & Plan Note (Signed)
Will check today, given other medical problems he may need treatment

## 2012-07-04 LAB — PTH, INTACT AND CALCIUM
Calcium, Total (PTH): 9.7 mg/dL (ref 8.4–10.5)
PTH: 53.9 pg/mL (ref 14.0–72.0)

## 2012-07-05 ENCOUNTER — Other Ambulatory Visit: Payer: Self-pay | Admitting: Internal Medicine

## 2012-07-05 DIAGNOSIS — E119 Type 2 diabetes mellitus without complications: Secondary | ICD-10-CM

## 2012-07-27 ENCOUNTER — Encounter: Payer: Self-pay | Admitting: *Deleted

## 2012-07-27 ENCOUNTER — Encounter: Payer: PRIVATE HEALTH INSURANCE | Attending: Internal Medicine | Admitting: *Deleted

## 2012-07-27 VITALS — Ht 72.0 in | Wt 249.6 lb

## 2012-07-27 DIAGNOSIS — E669 Obesity, unspecified: Secondary | ICD-10-CM

## 2012-07-27 DIAGNOSIS — E119 Type 2 diabetes mellitus without complications: Secondary | ICD-10-CM

## 2012-07-27 DIAGNOSIS — Z713 Dietary counseling and surveillance: Secondary | ICD-10-CM | POA: Insufficient documentation

## 2012-07-27 NOTE — Patient Instructions (Signed)
Plan:  Aim for 3 Carb Choices per meal (45 grams) +/- 1 either way  Aim for 0-1 Carbs per snack if hungry  Consider reading food labels for Total Carbohydrate of foods Consider  increasing your activity level by walking for 15-30 minutes daily as tolerated Consider asking MD about checking BG at alternate times per day

## 2012-07-27 NOTE — Progress Notes (Signed)
  Medical Nutrition Therapy:  Appt start time: 0800 end time:  0900.  Assessment:  Primary concerns today: patient here with his wife for obesity and new diagnosis of DM2. 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 Lives with his wife, they both grocery shop and wife prepares the meals. Gets up at 5:30 AM, visits with friends for coffee, does woodworking in his shop but has not been exercising due to back pain and recent history of kidney stones this week. He acknowledges importance of increasing his activity level to improve BG, weight control and hyperlipidemia.  MEDICATIONS: see list   DIETARY INTAKE:  Usual eating pattern includes 3 meals and 1-2 snacks per day.  Everyday foods include good variety of all food groups.  Avoided foods include fried and sweets.    24-hr recall:  B ( AM): 2 eggs with cheese, occasionally toast but not usually, coffee with Sweetner and 2% milk  Snk ( AM): almonds or granola bar with PNB or string cheese  L ( PM): eat at home usually; left over's or a salad from store with vinaigrette dressing, diet Coke Snk ( PM): same as AM D ( PM): meat, sweet potato, 2 vegetables, unsweetened tea with light sugar or water, Crystal light Snk ( PM): [place] yogurt Beverages: diet Coke, Crystal light, unsweetened tea with light sugar or water  Usual physical activity: not lately, understands need to get started again  Estimated energy needs: 1600 calories 180 g carbohydrates 120 g protein 44 g fat  Progress Towards Goal(s):  In progress.   Nutritional Diagnosis:  NB-1.1 Food and nutrition-related knowledge deficit As related to new diagnosis of DM2.  As evidenced by A1c of 6.7 on 07/03/2012 .    Intervention:  Nutrition counseling and diabetes education initiated. Discussed basic physiology of diabetes, SMBG and rationale of checking BG at alternate times of day, A1c, Carb Counting and reading food labels, and benefits of increased activity.   Plan:   Aim for 3 Carb Choices per meal (45 grams) +/- 1 either way  Aim for 0-1 Carbs per snack if hungry  Consider reading food labels for Total Carbohydrate of foods Consider  increasing your activity level by walking for 15-30 minutes daily as tolerated Consider asking MD about checking BG at alternate times per day   Handouts given during visit include: Living Well with Diabetes Carb Counting and Food Label handouts Meal Plan Card  Hyperlipidemia handout  Monitoring/Evaluation:  Dietary intake, exercise, reading food labels, and body weight in 6 week(s).

## 2012-08-09 ENCOUNTER — Encounter: Payer: Self-pay | Admitting: Cardiology

## 2012-08-09 DIAGNOSIS — IMO0001 Reserved for inherently not codable concepts without codable children: Secondary | ICD-10-CM

## 2012-08-09 DIAGNOSIS — I272 Pulmonary hypertension, unspecified: Secondary | ICD-10-CM

## 2012-08-09 DIAGNOSIS — R002 Palpitations: Secondary | ICD-10-CM

## 2012-09-10 ENCOUNTER — Ambulatory Visit: Payer: Medicare Other | Admitting: *Deleted

## 2012-09-19 ENCOUNTER — Other Ambulatory Visit: Payer: Self-pay | Admitting: Physician Assistant

## 2012-09-24 ENCOUNTER — Encounter: Payer: Self-pay | Admitting: Cardiovascular Disease

## 2012-10-21 ENCOUNTER — Other Ambulatory Visit: Payer: Self-pay | Admitting: Cardiovascular Disease

## 2012-10-21 ENCOUNTER — Other Ambulatory Visit: Payer: Self-pay | Admitting: Internal Medicine

## 2012-10-26 ENCOUNTER — Telehealth: Payer: Self-pay | Admitting: Pharmacist

## 2012-10-26 NOTE — Telephone Encounter (Signed)
Called pt to report lab results from 5/1.  While on the phone, pt mentioned he is having problems with swelling with his amlodipine.  Asked if he checked his BP at home and he stated he has not recently.  Requested he check his BP for the next few days.  If his BP is controlled, may try to hold amlodipine for a few days and see if swelling improves and if BP changes.  He also has furosemide PRN rx from PCP.  SCr elevated but stable from last check.  Told pt okay to take PRN if needed.

## 2012-10-31 NOTE — Telephone Encounter (Signed)
Agree with recommendations.  

## 2012-11-05 ENCOUNTER — Encounter: Payer: Self-pay | Admitting: Cardiovascular Disease

## 2012-11-05 ENCOUNTER — Ambulatory Visit (INDEPENDENT_AMBULATORY_CARE_PROVIDER_SITE_OTHER): Payer: PRIVATE HEALTH INSURANCE | Admitting: Cardiovascular Disease

## 2012-11-05 VITALS — BP 120/70 | HR 56 | Ht 72.0 in | Wt 254.2 lb

## 2012-11-05 DIAGNOSIS — G4730 Sleep apnea, unspecified: Secondary | ICD-10-CM

## 2012-11-05 DIAGNOSIS — R609 Edema, unspecified: Secondary | ICD-10-CM | POA: Insufficient documentation

## 2012-11-05 DIAGNOSIS — E785 Hyperlipidemia, unspecified: Secondary | ICD-10-CM

## 2012-11-05 DIAGNOSIS — I119 Hypertensive heart disease without heart failure: Secondary | ICD-10-CM

## 2012-11-05 DIAGNOSIS — E669 Obesity, unspecified: Secondary | ICD-10-CM

## 2012-11-05 DIAGNOSIS — I10 Essential (primary) hypertension: Secondary | ICD-10-CM

## 2012-11-05 DIAGNOSIS — N289 Disorder of kidney and ureter, unspecified: Secondary | ICD-10-CM

## 2012-11-05 NOTE — Progress Notes (Signed)
Patient ID: Martin Cordova, male   DOB: 1941/05/13, 72 y.o.   MRN: [id number]    HPI: Martin Cordova, is a 72 y.o. male is who presents to the office today for cardiology followup evaluation. Martin Cordova is now 72 years old. 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 In the past, he developed an obstructive uropathy attributed to kidney stones wears creatinine had risen up to 3 and ultimately improved and stabilized at approximately 1.5. Earlier this year with his blood pressure being elevated I elected amlodipine 5 mg to his medical regimen. With his renal insufficiency, he was taken off his therapy Martin Cordova uses his BiPAP daily and does note good sleep. These does complain of some ankle swelling last week he went on vacation and admitted to his diet being less than optimal for which she gained weight and noted some recurrent ankle swelling. He has been taking furosemide only on an as-needed basis and while he was on vacation he did take it 3 times a week. He presents today for followup evaluation.  Past Medical History  Diagnosis Date  . GERD 12/07/2006  . HYPERGLYCEMIA 12/07/2006  . HYPERLIPIDEMIA 12/07/2006    mixed wth an atherogenic dyslipidemic pattern  . HYPERTENSION 12/07/2006  . OBESITY 11/10/2009  . THROMBOCYTOPENIA 12/07/2006  . TRANSAMINASES, SERUM, ELEVATED 12/07/2006  . SLEEP APNEA 10/22/2007    uses bipap  . 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 Claiborne Cordova  . Diabetes mellitus without complication   . Renal insufficiency     Cr to 3 on ACE  . Nephrolithiasis   . Normal coronary arteries 04/06/2007    by cath EF 55%., last ECHO 10/13/10 EF>55% mild MR, last nuc 06/16/10 EF 57% low risk scan  . Pulmonary HTN     RV syst.  40-8mmHg- moderate by echo  .  Palpitations     tachy    Past Surgical History  Procedure Laterality Date  . Cholecystectomy    . Rotator cuff repair    . Cystoscopy/retrograde/ureteroscopy  08/19/2011    Procedure: CYSTOSCOPY/RETROGRADE/URETEROSCOPY;  Surgeon: Martin Ben, MD;  Location: [facility];  Service: Urology;  Laterality: Right;  JJ STENT PLACEMENT   . Cardiac catheterization  04/06/2007    normal cors    Allergies  Allergen Reactions  . Lisinopril     Renal insufficiency    Current Outpatient Prescriptions  Medication Sig Dispense Refill  . KRILL OIL PO Take by mouth.      Marland Kitchen amLODipine (NORVASC) 5 MG tablet Take 5 mg by mouth daily after breakfast.      . aspirin 325 MG EC tablet Take 325 mg by mouth daily.      Marland Kitchen atorvastatin (LIPITOR) 10 MG tablet Take 10 mg by mouth daily.      . carvedilol (COREG) 25 MG tablet Take 25 mg by mouth 2 (two) times daily with a meal. 2 tablets      . cetirizine (ZYRTEC) 10 MG tablet Take 10 mg by mouth daily.      . Cholecalciferol (VITAMIN D3) 1000 UNITS CAPS Take 2,000 Units by mouth daily.       . furosemide (LASIX) 20 MG tablet Take 20 mg by mouth daily as needed. Swelling       . Multiple Vitamin (MULTIVITAMIN) tablet  Take 1 tablet by mouth daily.        . niacin (NIASPAN) 1000 MG CR tablet TAKE 1 TABLET BY MOUTH AT BEDTIME  30 tablet  6  . omeprazole (PRILOSEC) 20 MG capsule TAKE 1 CAPSULE EVERY DAY  30 capsule  3  . potassium chloride SA (K-DUR,KLOR-CON) 20 MEQ tablet Take 20 mEq by mouth 1 day or 1 dose.      . sildenafil (VIAGRA) 100 MG tablet Take 100 mg by mouth daily as needed.         No current facility-administered medications for this visit.    Socially he is married has 4 children and 2 grandchildren. He is a distant relative to the "Fugate Cordova." He does try to walk and exercise. There is no tobacco use. He does drink alcohol to  ROS is notable for ankle swelling he denies fever chills night sweats. He does wear  glasses. He denies chest pain. He denies PND or orthopnea. He denies breakthrough snoring. He denies residual daytime sleepiness. He denies restless legs. He denies bleeding indigestion or paresthesias. He does have difficulty with erectile function. Other system review is negative.  PE BP 120/70  Pulse 56  Ht 6' (1.829 m)  Wt 254 lb 3.2 oz (115.304 kg)  BMI 34.47 kg/m2  General: Alert, oriented, no distress.  HEENT: Normocephalic, atraumatic. Pupils round and reactive; sclera anicteric;  Nose without nasal septal hypertrophy Mouth/Parynx benign; Mallinpatti scale 3  Neck: Thick, no JVD, no carotid briuts Lungs: clear to ausculatation and percussion; no wheezing or rales Heart: RRR, s1 s2 normal 1/6 sem Abdomen: Moderate central adiposity ;soft, nontender; no hepatosplenomehaly, BS+; abdominal aorta nontender and not dilated by palpation. Pulses 2+ Extremities: 1+ bilateral ankle swelling, Homan's sign negative  Neurologic: grossly nonfocal  ECG: Sinus rhythm at 56 beats per minute. Normal intervals. Normal axis  LABS:  BMET    Component Value Date/Time   NA 141 07/03/2012 0942   K 4.2 07/03/2012 0942   CL 109 07/03/2012 0942   CO2 26 07/03/2012 0942   GLUCOSE 133* 07/03/2012 0942   BUN 27* 07/03/2012 0942   CREATININE 1.5 07/03/2012 0942   CALCIUM 9.7 07/03/2012 0942   CALCIUM 9.4 07/03/2012 0942   GFRNONAA 46.00 02/02/2010 0808   GFRAA  Value: 45        The eGFR has been calculated using the MDRD equation. This calculation has not been validated in all clinical situations. eGFR's persistently <60 mL/min signify possible Chronic Kidney Disease.* 09/25/2008 0352     Hepatic Function Panel     Component Value Date/Time   PROT 7.5 07/03/2012 0942   ALBUMIN 4.2 07/03/2012 0942   AST 34 07/03/2012 0942   ALT 56* 07/03/2012 0942   ALKPHOS 39 07/03/2012 0942   BILITOT 0.8 07/03/2012 0942   BILIDIR 0.0 07/03/2012 0942   IBILI 1.1* 09/25/2008 0352     CBC    Component Value Date/Time    WBC 4.2* 07/03/2012 0942   RBC 4.51 07/03/2012 0942   HGB 13.6 07/03/2012 0942   HCT 40.6 07/03/2012 0942   PLT 107.0* 07/03/2012 0942   MCV 90.0 07/03/2012 0942   MCHC 33.5 07/03/2012 0942   RDW 14.1 07/03/2012 0942   LYMPHSABS 0.9 07/03/2012 0942   MONOABS 0.4 07/03/2012 0942   EOSABS 0.1 07/03/2012 0942   BASOSABS 0.0 07/03/2012 0942     BNP No results found for this basename: probnp    Lipid Panel     Component  Value Date/Time   CHOL 155 07/03/2012 0942   TRIG 211.0* 07/03/2012 0942   HDL 30.10* 07/03/2012 0942   CHOLHDL 5 07/03/2012 0942   VLDL 42.2* 07/03/2012 0942   LDLCALC 78 02/09/2011 0917     RADIOLOGY: No results found.    ASSESSMENT AND PLAN: The cardiac standpoint, Mr. Smother's blood pressure is now fairly well-controlled. In the past, he had been on lisonopril   when his creatinine had risen up to 3 in the setting of obstructive uropathy. I suspect the amlodipine may be contributing to some of his ankle swelling. Presently, I suggested that he increase his furosemide and take 20 mg every other day on even days the swelling subsides. I did review his most recent laboratory from 10/05/2012 and his creatinine was stable at 1.55. MR lipoprotein [place] LDL particle number is 682 with small LDL particles of 517. LDL calculated was 46. HDL particle number was 30.5 by low HDL cholesterol. His insulin resistance score was increased at 67. I did discuss with him his lipid studies. He does have probable metabolic syndrome. Discussed continued weight loss. He will increase his furosemide to every other day. His BiPAP seems to be working well. He asked that I renew his prescription for Viagra for his inability to sustain and adequate erection. He is not on any medications would be contraindicated with this therapy. I did renew this. I will see him in 6 months for followup cardiology evaluation.     Troy Sine, MD, [facility]  11/05/2012 2:00 PM

## 2012-11-05 NOTE — Patient Instructions (Signed)
Your physician recommends that you schedule a follow-up appointment in: 6 MONTHS.  No changes has been made in your theraphy today.

## 2012-11-07 ENCOUNTER — Telehealth: Payer: Self-pay | Admitting: *Deleted

## 2012-11-07 NOTE — Telephone Encounter (Signed)
Faxed copy of  most recent labs  To Dr. Leanne Chang per patient request.

## 2012-11-18 ENCOUNTER — Other Ambulatory Visit: Payer: Self-pay | Admitting: Cardiovascular Disease

## 2012-11-23 ENCOUNTER — Telehealth: Payer: Self-pay | Admitting: Cardiovascular Disease

## 2012-11-23 NOTE — Telephone Encounter (Signed)
ANKLES AND FEET SWELLING-THINKS HE NEEDS TO CHANGE HIS MEDICINE!

## 2012-11-23 NOTE — Telephone Encounter (Signed)
Returned call.  Pt stated Dr. Claiborne Billings has him on amlodipine and he told him it could make him swell.  Stated he told him to take furosemide three times a week r/t his kidney problems.  Stated it gets worse as the day goes on.  Pt wants to know if there is something else he can be put on instead of the amlodipine b/c of the swelling.  Pt c/o BLE edema.  Pt does not have compression stockings.  Denied CP, SOB, dizziness or lightheadedness.  Pt informed RN will discuss with MD/PA for further instructions.  Pt verbalized understanding and agreed w/ plan.  Message forwarded to Dr. Claiborne Billings.  Last OV note in Epic.

## 2012-11-23 NOTE — Telephone Encounter (Signed)
Try compression stockings and cut amlodipine in half; if continues to be a problem then will need to dc and change.

## 2012-11-23 NOTE — Telephone Encounter (Signed)
Returned call and informed pt per instructions by MD/PA.  Also advised to monitor NA+ intake and elevate LE above level of heart when sitting or lying down.  Pt verbalized understanding and agreed w/ plan.  Stated he is able to get compression stockings.

## 2012-12-06 ENCOUNTER — Other Ambulatory Visit (INDEPENDENT_AMBULATORY_CARE_PROVIDER_SITE_OTHER): Payer: Medicare Other

## 2012-12-06 DIAGNOSIS — E119 Type 2 diabetes mellitus without complications: Secondary | ICD-10-CM

## 2012-12-06 LAB — HEPATIC FUNCTION PANEL
ALT: 36 U/L (ref 0–53)
AST: 25 U/L (ref 0–37)
Albumin: 4.1 g/dL (ref 3.5–5.2)
Alkaline Phosphatase: 44 U/L (ref 39–117)
Bilirubin, Direct: 0.2 mg/dL (ref 0.0–0.3)
Total Bilirubin: 1 mg/dL (ref 0.3–1.2)
Total Protein: 7.4 g/dL (ref 6.0–8.3)

## 2012-12-06 LAB — LIPID PANEL
Cholesterol: 108 mg/dL (ref 0–200)
HDL: 31.9 mg/dL — ABNORMAL LOW (ref >39.00)
LDL Cholesterol: 40 mg/dL (ref 0–99)
Total CHOL/HDL Ratio: 3
Triglycerides: 183 mg/dL — ABNORMAL HIGH (ref 0.0–149.0)
VLDL: 36.6 mg/dL (ref 0.0–40.0)

## 2012-12-06 LAB — BASIC METABOLIC PANEL
BUN: 26 mg/dL — ABNORMAL HIGH (ref 6–23)
CO2: 29 mEq/L (ref 19–32)
Calcium: 9.2 mg/dL (ref 8.4–10.5)
Chloride: 107 mEq/L (ref 96–112)
Creatinine, Ser: 1.6 mg/dL — ABNORMAL HIGH (ref 0.4–1.5)
GFR: 45.29 mL/min — ABNORMAL LOW (ref >60.00)
Glucose, Bld: 134 mg/dL — ABNORMAL HIGH (ref 70–99)
Potassium: 4.2 mEq/L (ref 3.5–5.1)
Sodium: 139 mEq/L (ref 135–145)

## 2012-12-06 LAB — HEMOGLOBIN A1C: Hgb A1c MFr Bld: 6.8 % — ABNORMAL HIGH (ref 4.6–6.5)

## 2012-12-10 ENCOUNTER — Encounter: Payer: Self-pay | Admitting: *Deleted

## 2013-01-04 ENCOUNTER — Other Ambulatory Visit: Payer: Medicare Other

## 2013-01-04 DIAGNOSIS — Z131 Encounter for screening for diabetes mellitus: Secondary | ICD-10-CM

## 2013-01-04 LAB — MICROALBUMIN / CREATININE URINE RATIO
Creatinine,U: 110.9 mg/dL
Microalb Creat Ratio: 1.7 mg/g (ref 0.0–30.0)
Microalb, Ur: 1.9 mg/dL (ref 0.0–1.9)

## 2013-02-17 ENCOUNTER — Other Ambulatory Visit: Payer: Self-pay | Admitting: Internal Medicine

## 2013-03-25 ENCOUNTER — Other Ambulatory Visit: Payer: Self-pay | Admitting: *Deleted

## 2013-03-25 MED ORDER — FUROSEMIDE 20 MG PO TABS: 20.0000 mg | ORAL_TABLET | Freq: Every day | ORAL | Status: DC | PRN

## 2013-03-29 ENCOUNTER — Telehealth: Payer: Self-pay | Admitting: Cardiovascular Disease

## 2013-03-29 NOTE — Telephone Encounter (Signed)
Returned call and informed pt per instructions by NP.  Pt verbalized understanding and agreed w/ plan.  Pt prefers to see Dr. Claiborne Billings and not Extender.  Pt prefers AM appt and informed next AM appt w/ Dr. Claiborne Billings is 10.30.14 at 8:45am.  Pt scheduled that appt and will try extra 1/2 Coreg prn extra beats until seen.  Pt verbalized understanding that he can ONLY take the extra 1/2 tab once in 24 hours.

## 2013-03-29 NOTE — Telephone Encounter (Signed)
Message forwarded to L. Dorene Ar, NP for further instructions.

## 2013-03-29 NOTE — Telephone Encounter (Signed)
Amlodipine will not stop irreg heart beats.  Stop caffeine,  No decongestants.  He could take an extra half of coreg in a day for extra beats.  Otherwise appt Monday or Tuesday.

## 2013-03-29 NOTE — Telephone Encounter (Signed)
Returned call.  Pt stated twice this week he has had irregular heartbeats.  Stated he is on amlodipine 2.5 mg now from 5 mg when it was causing swelling.  Stated the last few days he has had irregular heartbeats on and off.  Pt wants to know if he can go back on the 5 mg.  Pt informed Dr. Claiborne Billings is out of the office today and RN will notify [facility] this afternoon.  Pt verbalized understanding and agreed w/ plan.  Pt has already take 2.5 mg today.

## 2013-03-29 NOTE — Telephone Encounter (Signed)
Please call asap-been having problem with his irregular heart beats off and on fir the last 3 days,

## 2013-04-04 ENCOUNTER — Ambulatory Visit (INDEPENDENT_AMBULATORY_CARE_PROVIDER_SITE_OTHER): Payer: Medicare Other | Admitting: Cardiovascular Disease

## 2013-04-04 ENCOUNTER — Encounter: Payer: Self-pay | Admitting: Cardiovascular Disease

## 2013-04-04 VITALS — BP 152/100 | HR 56 | Ht 72.0 in | Wt 262.1 lb

## 2013-04-04 DIAGNOSIS — R002 Palpitations: Secondary | ICD-10-CM

## 2013-04-04 MED ORDER — METFORMIN HCL 500 MG PO TABS: 500.0000 mg | ORAL_TABLET | Freq: Two times a day (BID) | ORAL | Status: DC

## 2013-04-04 MED ORDER — DILTIAZEM HCL ER COATED BEADS 120 MG PO CP24: ORAL_CAPSULE | ORAL | Status: DC

## 2013-04-04 NOTE — Progress Notes (Signed)
Patient ID: Martin Cordova, male   DOB: 11-Oct-1940, 72 y.o.   MRN: [id number]     HPI: Martin Cordova, is a 72 y.o. male is who presents to the office today for cardiology followup evaluation.   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 In the past, he developed an obstructive uropathy attributed to kidney stones wears creatinine had risen up to 3 and ultimately improved and stabilized at approximately 1.5. Earlier this year with his blood pressure being elevated I elected amlodipine 5 mg to his medical regimen.  [name] uses his BiPAP daily and does note good sleep. These does complain of ankle swelling. When I last saw him in June I suggested he increase his furosemide and take 20 mg every other day to help control some of his peripheral edema. He had been previously taken off his lisinopril when his creatinine had risen in the setting of his obstructive uropathy. He underwent laboratory in July which showed a total cholesterol 108 triglycerides 183 HDL 31 LDL 40 but LDL particle number was not obtained. Hemoglobin A1c was elevated at 6.8. In January when taken by Dr. Delsa Bern was 6.7. Between January and June he did have nutritional consultation with no improvement in his hemoglobin A1c. Review of the chart indicates that his heel A1c has been in the 6.5 range for almost 3 years. He presents today complaining of episodic palpitations. These seemingly have become worse and seem to be occurring every other day. They typically occur in the late afternoon or early morning. He's not had any change in caffeine intake. He denies associated chest pressure or presyncope.  Past Medical History  Diagnosis Date  . GERD 12/07/2006  . HYPERGLYCEMIA 12/07/2006  . HYPERLIPIDEMIA 12/07/2006    mixed wth an atherogenic  dyslipidemic pattern  . HYPERTENSION 12/07/2006  . OBESITY 11/10/2009  . THROMBOCYTOPENIA 12/07/2006  . TRANSAMINASES, SERUM, ELEVATED 12/07/2006  . SLEEP APNEA 10/22/2007    uses bipap  . 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 Claiborne Billings  . Diabetes mellitus without complication   . Renal insufficiency     Cr to 3 on ACE  . Nephrolithiasis   . Normal coronary arteries 04/06/2007    by cath EF 55%., last ECHO 10/13/10 EF>55% mild MR, last nuc 06/16/10 EF 57% low risk scan  . Pulmonary HTN     RV syst.  40-63mmHg- moderate by echo  . Palpitations     tachy    Past Surgical History  Procedure Laterality Date  . Cholecystectomy    . Rotator cuff repair    . Cystoscopy/retrograde/ureteroscopy  08/19/2011    Procedure: CYSTOSCOPY/RETROGRADE/URETEROSCOPY;  Surgeon: Hanley Ben, MD;  Location: [facility];  Service: Urology;  Laterality: Right;  JJ STENT PLACEMENT   . Cardiac catheterization  04/06/2007    normal cors    Allergies  Allergen Reactions  . Lisinopril     Renal insufficiency    Current Outpatient Prescriptions  Medication Sig Dispense Refill  . aspirin 325 MG EC tablet Take 325 mg by mouth daily.      Marland Kitchen atorvastatin (LIPITOR) 10 MG tablet Take 10 mg by mouth daily.      . carvedilol (COREG) 25 MG tablet TAKE 2 TABLETS TWICE A DAY  120 tablet  5  . cetirizine (ZYRTEC) 10  MG tablet Take 10 mg by mouth daily.      . Cholecalciferol (VITAMIN D3) 1000 UNITS CAPS Take 2,000 Units by mouth daily.       . furosemide (LASIX) 20 MG tablet Take 20 mg by mouth every other day. Swelling      . KRILL OIL PO Take by mouth.      . Multiple Vitamin (MULTIVITAMIN) tablet Take 1 tablet by mouth daily.        . niacin (NIASPAN) 1000 MG CR tablet TAKE 1 TABLET BY MOUTH AT BEDTIME  30 tablet  6  . NON FORMULARY BIPAP therapy      . omeprazole (PRILOSEC) 20 MG capsule TAKE ONE CAPSULE BY MOUTH DAILY  30 capsule  3  . potassium citrate (UROCIT-K) 10 MEQ (1080 MG) SR  tablet       . sildenafil (VIAGRA) 100 MG tablet Take 100 mg by mouth daily as needed.        Marland Kitchen ZOSTAVAX 09811 UNT/0.65ML injection       . amLODipine (NORVASC) 5 MG tablet Take 5 mg by mouth daily after breakfast. Takes 1/2 tablet      . diltiazem (CARDIZEM CD) 120 MG 24 hr capsule Take at night.  30 capsule  6  . metFORMIN (GLUCOPHAGE) 500 MG tablet Take 1 tablet (500 mg total) by mouth 2 (two) times daily with a meal.  60 tablet  6   No current facility-administered medications for this visit.    Socially he is married has 4 children and 2 grandchildren. He is a distant relative to the "Gaspari brothers." He does try to walk and exercise. There is no tobacco use. He does drink alcohol to  ROS is notable for ankle swelling he denies fever chills night sweats. He does wear glasses. He denies chest pain. He has been using his BiPAP therapy with 100% compliance. He denies PND or orthopnea. He denies breakthrough snoring. He denies residual daytime sleepiness. He denies restless legs. He denies abdominal pain. He denies nausea vomiting. He denies urinary changes. He denies blood in his stool. He is unaware of anemia. His renal function has stabilized to a creatinine in the 1.5-1.6 range. He denies bleeding indigestion or paresthesias. He denies claudication. He does have difficulty with erectile function. Other comprehensive 12 point system review is negative.  PE BP 152/100  Pulse 56  Ht 6' (1.829 m)  Wt 262 lb 1.6 oz (118.888 kg)  BMI 35.54 kg/m2  Repeat blood pressure 140/88. General: Alert, oriented, no distress.  HEENT: Normocephalic, atraumatic. Pupils round and reactive; sclera anicteric;  Nose without nasal septal hypertrophy Mouth/Parynx benign; Mallinpatti scale 3  Neck: Thick, no JVD, no carotid briuts Lungs: clear to ausculatation and percussion; no wheezing or rales Heart: RRR, s1 s2 normal 1/6 sem; no ectopy heard on auscultation Abdomen: Moderate central adiposity ;soft,  nontender; no hepatosplenomehaly, BS+; abdominal aorta nontender and not dilated by palpation. Pulses 2+ Extremities: 1+ bilateral ankle swelling, Homan's sign negative  Neurologic: grossly nonfocal  ECG: Sinus rhythm at 56 beats per minute. PR interval 184 ms. QT interval 407 ms.  LABS:  BMET    Component Value Date/Time   NA 139 12/06/2012 0823   K 4.2 12/06/2012 0823   CL 107 12/06/2012 0823   CO2 29 12/06/2012 0823   GLUCOSE 134* 12/06/2012 0823   BUN 26* 12/06/2012 0823   CREATININE 1.6* 12/06/2012 0823   CALCIUM 9.2 12/06/2012 0823   CALCIUM 9.7 07/03/2012 0942  GFRNONAA 46.00 02/02/2010 0808   GFRAA  Value: 45        The eGFR has been calculated using the MDRD equation. This calculation has not been validated in all clinical situations. eGFR's persistently <60 mL/min signify possible Chronic Kidney Disease.* 09/25/2008 0352     Hepatic Function Panel     Component Value Date/Time   PROT 7.4 12/06/2012 0823   ALBUMIN 4.1 12/06/2012 0823   AST 25 12/06/2012 0823   ALT 36 12/06/2012 0823   ALKPHOS 44 12/06/2012 0823   BILITOT 1.0 12/06/2012 0823   BILIDIR 0.2 12/06/2012 0823   IBILI 1.1* 09/25/2008 0352     CBC    Component Value Date/Time   WBC 4.2* 07/03/2012 0942   RBC 4.51 07/03/2012 0942   HGB 13.6 07/03/2012 0942   HCT 40.6 07/03/2012 0942   PLT 107.0* 07/03/2012 0942   MCV 90.0 07/03/2012 0942   MCHC 33.5 07/03/2012 0942   RDW 14.1 07/03/2012 0942   LYMPHSABS 0.9 07/03/2012 0942   MONOABS 0.4 07/03/2012 0942   EOSABS 0.1 07/03/2012 0942   BASOSABS 0.0 07/03/2012 0942     BNP No results found for this basename: probnp    Lipid Panel     Component Value Date/Time   CHOL 108 12/06/2012 0823   TRIG 183.0* 12/06/2012 0823   HDL 31.90* 12/06/2012 0823   CHOLHDL 3 12/06/2012 0823   VLDL 36.6 12/06/2012 0823   LDLCALC 40 12/06/2012 0823     RADIOLOGY: No results found.    ASSESSMENT AND PLAN: Mr. birts presents to the office today with a chief complaint of increasing episodes of  palpitations. Initially these had occurred weekly but now seem to be occurring every other day. He does have significant low terminating the undoubtedly contributed by his amlodipine. I'm electing to discontinue his amlodipine and we'll change this to Cardizem CD 120 mg to take at bedtime. 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 I have suggested initiation of metformin 500 mg twice a day. I scheduled him for a two-week CardioNet monitor to evaluate his rhythm disturbance with the addition of Cardizem added to his carvedilol 25 mg twice a day. I did review his most recent laboratory. We discussed the importance of weight loss increased exercise. I will see him in 4 weeks for followup Cardiologic reassessment.   Troy Sine, MD, [facility]  04/04/2013 1:58 PM

## 2013-04-04 NOTE — Patient Instructions (Signed)
Your physician has recommended that you wear an event monitor. Event monitors are medical devices that record the heart's electrical activity. Doctors most often [place] these monitors to diagnose arrhythmias. Arrhythmias are problems with the speed or rhythm of the heartbeat. The monitor is a small, portable device. You can wear one while you do your normal daily activities. This is usually used to diagnose what is causing palpitations/syncope (passing out).  Your physician has recommended you make the following change in your medication: STOP the amlodipoine. Start cardizem (diltiazem). Start metformin. The prescription Has been sent to the pharmacy already. Your physician recommends that you schedule a follow-up appointment in: 4 WEEKS.

## 2013-04-07 ENCOUNTER — Telehealth: Payer: Self-pay | Admitting: Cardiology

## 2013-04-07 NOTE — Telephone Encounter (Signed)
Called by [facility]. Pt went into AF with VR of 90. Will need to discuss with Dr Claiborne Billings in AM-? Xarelto- he has had renal insufficiency in the past with SCr as high as 3.  Kerin Ransom PA-C 04/07/2013 9:42 AM

## 2013-04-07 NOTE — Telephone Encounter (Signed)
I called pt, he noted palpitations but is otherwise doing well. I told him we would discuss this with Dr Claiborne Billings in the am.  Kerin Ransom PA-C 04/07/2013 9:46 AM

## 2013-04-08 ENCOUNTER — Encounter: Payer: Self-pay | Admitting: Cardiology

## 2013-04-08 ENCOUNTER — Ambulatory Visit (INDEPENDENT_AMBULATORY_CARE_PROVIDER_SITE_OTHER): Payer: Medicare Other | Admitting: Cardiology

## 2013-04-08 VITALS — BP 160/86 | HR 56 | Ht 72.0 in | Wt 264.1 lb

## 2013-04-08 DIAGNOSIS — I10 Essential (primary) hypertension: Secondary | ICD-10-CM

## 2013-04-08 DIAGNOSIS — IMO0001 Reserved for inherently not codable concepts without codable children: Secondary | ICD-10-CM

## 2013-04-08 DIAGNOSIS — D696 Thrombocytopenia, unspecified: Secondary | ICD-10-CM

## 2013-04-08 DIAGNOSIS — I4891 Unspecified atrial fibrillation: Secondary | ICD-10-CM

## 2013-04-08 DIAGNOSIS — Z0389 Encounter for observation for other suspected diseases and conditions ruled out: Secondary | ICD-10-CM

## 2013-04-08 DIAGNOSIS — I480 Paroxysmal atrial fibrillation: Secondary | ICD-10-CM

## 2013-04-08 DIAGNOSIS — R5381 Other malaise: Secondary | ICD-10-CM

## 2013-04-08 DIAGNOSIS — R5383 Other fatigue: Secondary | ICD-10-CM

## 2013-04-08 MED ORDER — APIXABAN 5 MG PO TABS: 5.0000 mg | ORAL_TABLET | Freq: Two times a day (BID) | ORAL | Status: DC

## 2013-04-08 NOTE — Patient Instructions (Addendum)
Your monitor is showing your irregular heart rate is atrial fibrillation.  You are going in and out of it and your are symptomatic with fatigue,  Because you are in and out of this rhythm you are at higher risk for stroke.  Therefore Dr Claiborne Billings wants you to take anticooagulation  With Eliquis.  This is a blood thinner that does not require blood checks.  Call if you have any bleeding.  Resume your lasix (furosemide) every other day, your BP is elevated today.    Stop your asprin.  Have lab work done.  Follow up with Dr. Claiborne Billings as instructed 05/06/13

## 2013-04-08 NOTE — Progress Notes (Signed)
04/09/2013   PCP: Chancy Hurter, MD   Chief Complaint  Patient presents with  . Follow-up    Afib was shown on his monitor was called to by wanda to see a PA, feel some irregular heart beat on saturday    Primary Cardiologist: Dr. 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 In the past, he developed an obstructive uropathy attributed to kidney stones wears creatinine had risen up to 3 and ultimately improved and stabilized at approximately 1.5. Earlier this year with his blood pressure being elevated Dr. Claiborne Billings added amlodipine 5 mg to his medical regimen. Mr. Ozaeta uses his BiPAP daily and does note good sleep. He did complain of ankle swelling. When Dr. Claiborne Billings last saw him in June he suggested he increase his furosemide and take 20 mg every other day to help control some of his peripheral edema. He had been previously taken off his lisinopril when his creatinine had risen in the setting of his obstructive uropathy. He underwent laboratory in July which showed a total cholesterol 108 triglycerides 183 HDL 31 LDL 40 but LDL particle number was not obtained. Hemoglobin A1c was elevated at 6.8. In January when taken by Dr. Delsa Bern was 6.7. Between January and June he did have nutritional consultation with no improvement in his hemoglobin A1c. Review of the chart indicates that his heel A1c has been in the 6.5 range for almost 3 years.  Dr. Claiborne Billings changed his amlodipine to cardizem for palpitations on his last visit and ordered an event monitor.  Over the weekend pt had episodes of PAF rate controlled.  He is here now for anticoagulation.  I discussed rhythms with Dr. Claiborne Billings and with PAF and elevated CHads Vas score and no history of GI bleed we will add eliquis.        Allergies  Allergen Reactions  . Lisinopril     Renal insufficiency    Current Outpatient Prescriptions  Medication Sig Dispense Refill  . aspirin 325 MG EC tablet Take 325 mg by mouth daily.      Marland Kitchen atorvastatin (LIPITOR) 10 MG tablet Take 10 mg by mouth daily.      . carvedilol (COREG) 25 MG tablet TAKE 2 TABLETS TWICE A DAY  120 tablet  5  . cetirizine (ZYRTEC) 10 MG tablet Take 10 mg by mouth daily.      . Cholecalciferol (VITAMIN D3) 1000 UNITS CAPS Take 2,000 Units by mouth daily.       Marland Kitchen diltiazem (CARDIZEM CD) 120 MG 24 hr capsule Take at night.  30 capsule  6  . furosemide (LASIX) 20 MG tablet Take 20 mg by mouth every other day. Swelling      . KRILL OIL PO Take by mouth.      . metFORMIN (GLUCOPHAGE) 500 MG tablet Take 1 tablet (500 mg total) by mouth 2 (two) times daily with a meal.  60 tablet  6  . Multiple Vitamin (MULTIVITAMIN) tablet Take 1 tablet by mouth daily.        . niacin (NIASPAN) 1000 MG CR tablet TAKE 1 TABLET BY MOUTH AT BEDTIME  30 tablet  6  . NON FORMULARY BIPAP therapy      . omeprazole (PRILOSEC) 20 MG capsule TAKE  ONE CAPSULE BY MOUTH DAILY  30 capsule  3  . potassium citrate (UROCIT-K) 10 MEQ (1080 MG) SR tablet       . sildenafil (VIAGRA) 100 MG tablet Take 100 mg by mouth daily as needed.        Marland Kitchen ZOSTAVAX [id number] UNT/0.65ML injection       . apixaban (ELIQUIS) 5 MG TABS tablet Take 1 tablet (5 mg total) by mouth 2 (two) times daily.  28 tablet  0   No current facility-administered medications for this visit.    Past Medical History  Diagnosis Date  . GERD 12/07/2006  . HYPERGLYCEMIA 12/07/2006  . HYPERLIPIDEMIA 12/07/2006    mixed wth an atherogenic dyslipidemic pattern  . HYPERTENSION 12/07/2006  . OBESITY 11/10/2009  . THROMBOCYTOPENIA 12/07/2006  . TRANSAMINASES, SERUM, ELEVATED 12/07/2006  . SLEEP APNEA 10/22/2007    uses bipap  . 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 Claiborne Billings  . Diabetes mellitus without complication   . Renal  insufficiency     Cr to 3 on ACE  . Nephrolithiasis   . Normal coronary arteries 04/06/2007    by cath EF 55%., last ECHO 10/13/10 EF>55% mild MR, last nuc 06/16/10 EF 57% low risk scan  . Pulmonary HTN     RV syst.  40-86mmHg- moderate by echo  . Palpitations     tachy  . PAF (paroxysmal atrial fibrillation) 04/09/2013    Past Surgical History  Procedure Laterality Date  . Cholecystectomy    . Rotator cuff repair    . Cystoscopy/retrograde/ureteroscopy  08/19/2011    Procedure: CYSTOSCOPY/RETROGRADE/URETEROSCOPY;  Surgeon: Hanley Ben, MD;  Location: [facility];  Service: Urology;  Laterality: Right;  JJ STENT PLACEMENT   . Cardiac catheterization  04/06/2007    normal cors    [id number] colds or fevers, no weight changes Skin:no rashes or ulcers HEENT:no blurred vision, no congestion CV:see HPI PUL:see HPI GI:no diarrhea constipation or melena, no indigestion GU:no hematuria, no dysuria MS:no joint pain, no claudication, some lower ext. swelling Neuro:no syncope, no lightheadedness Endo:+ diabetes, no thyroid disease  PHYSICAL EXAM BP 160/86  Pulse 56  Ht 6' (1.829 m)  Wt 264 lb 1.6 oz (119.795 kg)  BMI 35.81 kg/m2 General:Pleasant affect, NAD Skin:Warm and dry, brisk capillary refill HEENT:normocephalic, sclera clear, mucus membranes moist Neck:supple, no JVD, no bruits  Heart:S1S2 RRR with soft 1/6 systolic murmur, no gallup, rub or click Lungs:clear without rales, rhonchi, or wheezes [id number], soft, non tender, + BS, do not palpate liver spleen or masses Ext:no lower ext edema, 2+ pedal pulses, 2+ radial pulses Neuro:alert and oriented, MAE, follows commands, + facial symmetry  EKG:SB at 56 no acute changes. Review of cardionet monitoring with PAF rate controlled  ASSESSMENT AND PLAN PAF (paroxysmal atrial fibrillation) Patient with complaints of palpitations now with the monitor atrial fibrillation is documented. It is rate controlled.  He has episodes that come and go. Dr. Claiborne Billings had had a Cardizem on his last visit. Heart rate in sinus rhythm with borderline therefore I'll leave it at 120 mg daily patient continues with episodic atrial fib we may need to increase 180 mg.  Also in with elevated CHADs score we'll add Eliquis for anticoagulation.  We'll check labs today and then he'll need followup labs in 2-3 weeks. Erasmo Downer our pharmacist also discussed with him the anticoagulation and side effects that may occur.  Normal coronary arteries EF 55% no chest pain no shortness of breath just awareness of  irregular heartbeat.  HYPERTENSION Blood pressure is still somewhat elevated he had changed his Lasix to when necessary he does have lower extremity edema today, we'll put him back on every other day Lasix, which will help with blood pressure as well.  THROMBOCYTOPENIA We'll monitor while on anticoagulation.    Pt will follow up with Dr. Claiborne Billings as instructed.  We will check labs as well.

## 2013-04-09 ENCOUNTER — Encounter: Payer: Self-pay | Admitting: Cardiology

## 2013-04-09 DIAGNOSIS — I480 Paroxysmal atrial fibrillation: Secondary | ICD-10-CM

## 2013-04-09 HISTORY — DX: Paroxysmal atrial fibrillation: I48.0

## 2013-04-09 LAB — COMPREHENSIVE METABOLIC PANEL
ALT: 53 U/L (ref 0–53)
AST: 31 U/L (ref 0–37)
Albumin: 4.3 g/dL (ref 3.5–5.2)
Alkaline Phosphatase: 46 U/L (ref 39–117)
BUN: 21 mg/dL (ref 6–23)
CO2: 29 mEq/L (ref 19–32)
Calcium: 9.5 mg/dL (ref 8.4–10.5)
Chloride: 106 mEq/L (ref 96–112)
Creat: 1.55 mg/dL — ABNORMAL HIGH (ref 0.50–1.35)
Glucose, Bld: 111 mg/dL — ABNORMAL HIGH (ref 70–99)
Potassium: 4.8 mEq/L (ref 3.5–5.3)
Sodium: 142 mEq/L (ref 135–145)
Total Bilirubin: 0.6 mg/dL (ref 0.3–1.2)
Total Protein: 7 g/dL (ref 6.0–8.3)

## 2013-04-09 LAB — CBC
HCT: 39.9 % (ref 39.0–52.0)
Hemoglobin: 13.3 g/dL (ref 13.0–17.0)
MCH: 29.7 pg (ref 26.0–34.0)
MCHC: 33.3 g/dL (ref 30.0–36.0)
MCV: 89.1 fL (ref 78.0–100.0)
Platelets: 110 10*3/uL — ABNORMAL LOW (ref 150–400)
RBC: 4.48 MIL/uL (ref 4.22–5.81)
RDW: 14.9 % (ref 11.5–15.5)
WBC: 4.9 10*3/uL (ref 4.0–10.5)

## 2013-04-09 LAB — MAGNESIUM: Magnesium: 1.9 mg/dL (ref 1.5–2.5)

## 2013-04-09 LAB — TSH: TSH: 1.781 u[IU]/mL (ref 0.350–4.500)

## 2013-04-09 NOTE — Assessment & Plan Note (Signed)
Patient with complaints of palpitations now with the monitor atrial fibrillation is documented. It is rate controlled. He has episodes that come and go. Dr. Claiborne Billings had had a Cardizem on his last visit. Heart rate in sinus rhythm with borderline therefore I'll leave it at 120 mg daily patient continues with episodic atrial fib we may need to increase 180 mg.  Also in with elevated CHADs score we'll add Eliquis for anticoagulation.  We'll check labs today and then he'll need followup labs in 2-3 weeks. Erasmo Downer our pharmacist also discussed with him the anticoagulation and side effects that may occur.

## 2013-04-09 NOTE — Assessment & Plan Note (Signed)
Blood pressure is still somewhat elevated he had changed his Lasix to when necessary he does have lower extremity edema today, we'll put him back on every other day Lasix, which will help with blood pressure as well.

## 2013-04-09 NOTE — Assessment & Plan Note (Signed)
EF 55% no chest pain no shortness of breath just awareness of irregular heartbeat.

## 2013-04-09 NOTE — Assessment & Plan Note (Signed)
We'll monitor while on anticoagulation.

## 2013-04-10 ENCOUNTER — Telehealth: Payer: Self-pay | Admitting: *Deleted

## 2013-04-10 DIAGNOSIS — I4891 Unspecified atrial fibrillation: Secondary | ICD-10-CM

## 2013-04-10 DIAGNOSIS — Z79899 Other long term (current) drug therapy: Secondary | ICD-10-CM

## 2013-04-10 NOTE — Progress Notes (Signed)
Quick Note:  Informed patient of lab results. Recheck CBC and CMET IN 2-3 weeks. Lab order mailed to patient. ______

## 2013-04-10 NOTE — Telephone Encounter (Signed)
Informed patient labs stable. Repeat CBC and CMET in 2-3 weeks. I will mail a lab slip to him.

## 2013-04-10 NOTE — Telephone Encounter (Signed)
Message copied by Lauralee Evener on Wed Apr 10, 2013  9:50 AM ------      Message from: Martin Cordova      Created: Tue Apr 09, 2013  8:32 AM       Stable labs.  Lets repeat cbc and cmp in 2-3 weeks just to make everything stable on Eliquis. ------

## 2013-04-12 ENCOUNTER — Telehealth: Payer: Self-pay | Admitting: *Deleted

## 2013-04-12 ENCOUNTER — Ambulatory Visit (INDEPENDENT_AMBULATORY_CARE_PROVIDER_SITE_OTHER): Payer: Medicare Other | Admitting: Internal Medicine

## 2013-04-12 VITALS — BP 138/74 | HR 60 | Temp 98.0°F | Ht 72.0 in | Wt 259.0 lb

## 2013-04-12 DIAGNOSIS — I4891 Unspecified atrial fibrillation: Secondary | ICD-10-CM

## 2013-04-12 DIAGNOSIS — E1159 Type 2 diabetes mellitus with other circulatory complications: Secondary | ICD-10-CM

## 2013-04-12 DIAGNOSIS — I480 Paroxysmal atrial fibrillation: Secondary | ICD-10-CM

## 2013-04-12 DIAGNOSIS — I10 Essential (primary) hypertension: Secondary | ICD-10-CM

## 2013-04-12 DIAGNOSIS — E785 Hyperlipidemia, unspecified: Secondary | ICD-10-CM

## 2013-04-12 LAB — LIPID PANEL
Cholesterol: 123 mg/dL (ref 0–200)
HDL: 34.7 mg/dL — ABNORMAL LOW (ref >39.00)
Total CHOL/HDL Ratio: 4
Triglycerides: 229 mg/dL — ABNORMAL HIGH (ref 0.0–149.0)
VLDL: 45.8 mg/dL — ABNORMAL HIGH (ref 0.0–40.0)

## 2013-04-12 LAB — HEPATIC FUNCTION PANEL
ALT: 59 U/L — ABNORMAL HIGH (ref 0–53)
AST: 38 U/L — ABNORMAL HIGH (ref 0–37)
Albumin: 4.4 g/dL (ref 3.5–5.2)
Alkaline Phosphatase: 46 U/L (ref 39–117)
Bilirubin, Direct: 0.2 mg/dL (ref 0.0–0.3)
Total Bilirubin: 1.1 mg/dL (ref 0.3–1.2)
Total Protein: 7.6 g/dL (ref 6.0–8.3)

## 2013-04-12 LAB — BASIC METABOLIC PANEL
BUN: 24 mg/dL — ABNORMAL HIGH (ref 6–23)
CO2: 30 mEq/L (ref 19–32)
Calcium: 9.7 mg/dL (ref 8.4–10.5)
Chloride: 103 mEq/L (ref 96–112)
Creatinine, Ser: 1.6 mg/dL — ABNORMAL HIGH (ref 0.4–1.5)
GFR: 46.24 mL/min — ABNORMAL LOW (ref >60.00)
Glucose, Bld: 135 mg/dL — ABNORMAL HIGH (ref 70–99)
Potassium: 4.5 mEq/L (ref 3.5–5.1)
Sodium: 140 mEq/L (ref 135–145)

## 2013-04-12 LAB — HEMOGLOBIN A1C: Hgb A1c MFr Bld: 7.3 % — ABNORMAL HIGH (ref 4.6–6.5)

## 2013-04-12 LAB — MICROALBUMIN / CREATININE URINE RATIO
Creatinine,U: 159.5 mg/dL
Microalb Creat Ratio: 4.8 mg/g (ref 0.0–30.0)
Microalb, Ur: 7.6 mg/dL — ABNORMAL HIGH (ref 0.0–1.9)

## 2013-04-12 LAB — LDL CHOLESTEROL, DIRECT: Direct LDL: 65 mg/dL

## 2013-04-12 NOTE — Assessment & Plan Note (Signed)
Currently wearing heart monitor On apixaban Has f/u with [facility]

## 2013-04-12 NOTE — Progress Notes (Signed)
Pre-visit discussion using our clinic review tool. No additional management support is needed unless otherwise documented below in the visit note.  

## 2013-04-12 NOTE — Telephone Encounter (Signed)
Faxed referral along with sleep studies, office notes, demographics and insurance information to [facility] for them to take over patients CPAP care. He was a former patient of [facility].

## 2013-04-14 NOTE — Assessment & Plan Note (Signed)
BP Readings from Last 3 Encounters:  04/12/13 138/74  04/08/13 160/86  04/04/13 152/100  fair control- continue same meds Monitor bp at home

## 2013-04-14 NOTE — Progress Notes (Signed)
PAF- new DX- has CV appt On apixaban- no bleeding complications Wearing cardiac monitor currently  htn- no home bps  Lipids- no sxs and tolerating meds   Past Medical History  Diagnosis Date  . GERD 12/07/2006  . HYPERGLYCEMIA 12/07/2006  . HYPERLIPIDEMIA 12/07/2006    mixed wth an atherogenic dyslipidemic pattern  . HYPERTENSION 12/07/2006  . OBESITY 11/10/2009  . THROMBOCYTOPENIA 12/07/2006  . TRANSAMINASES, SERUM, ELEVATED 12/07/2006  . SLEEP APNEA 10/22/2007    uses bipap  . 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 Claiborne Billings  . Diabetes mellitus without complication   . Renal insufficiency     Cr to 3 on ACE  . Nephrolithiasis   . Normal coronary arteries 04/06/2007    by cath EF 55%., last ECHO 10/13/10 EF>55% mild MR, last nuc 06/16/10 EF 57% low risk scan  . Pulmonary HTN     RV syst.  40-74mmHg- moderate by echo  . Palpitations     tachy  . PAF (paroxysmal atrial fibrillation) 04/09/2013    History   Social History  . Marital Status: Married    Spouse Name: N/A    Number of Children: 4  . Years of Education: N/A   Occupational History  . Not on file.   Social History Main Topics  . Smoking status: Former Smoker -- 10 years    Quit date: 08/04/1980  . Smokeless tobacco: Former Systems developer    Types: Chew    Quit date: 08/08/2004  . Alcohol Use: No  . Drug Use: No  . Sexual Activity: Not on file   Other Topics Concern  . Not on file   Social History Narrative  . No narrative on file    Past Surgical History  Procedure Laterality Date  . Cholecystectomy    . Rotator cuff repair    . Cystoscopy/retrograde/ureteroscopy  08/19/2011    Procedure: CYSTOSCOPY/RETROGRADE/URETEROSCOPY;  Surgeon: Hanley Ben, MD;  Location: [facility];  Service: Urology;  Laterality: Right;  JJ STENT PLACEMENT   . Cardiac catheterization  04/06/2007    normal cors    Family History  Problem Relation Age of Onset  . Diabetes Mother   . Heart failure Mother    . Stroke Mother   . Kidney failure Father   . Diabetes Father     Allergies  Allergen Reactions  . Lisinopril     Renal insufficiency    Current Outpatient Prescriptions on File Prior to Visit  Medication Sig Dispense Refill  . apixaban (ELIQUIS) 5 MG TABS tablet Take 1 tablet (5 mg total) by mouth 2 (two) times daily.  28 tablet  0  . aspirin 325 MG EC tablet Take 325 mg by mouth daily.      Marland Kitchen atorvastatin (LIPITOR) 10 MG tablet Take 10 mg by mouth daily.      . carvedilol (COREG) 25 MG tablet TAKE 2 TABLETS TWICE A DAY  120 tablet  5  . cetirizine (ZYRTEC) 10 MG tablet Take 10 mg by mouth daily.      . Cholecalciferol (VITAMIN D3) 1000 UNITS CAPS Take 2,000 Units by mouth daily.       Marland Kitchen diltiazem (CARDIZEM CD) 120 MG 24 hr capsule Take at night.  30 capsule  6  . furosemide (LASIX) 20 MG tablet Take 20 mg by mouth every other day. Swelling      . KRILL OIL PO Take by mouth.      . metFORMIN (GLUCOPHAGE) 500 MG tablet  Take 1 tablet (500 mg total) by mouth 2 (two) times daily with a meal.  60 tablet  6  . Multiple Vitamin (MULTIVITAMIN) tablet Take 1 tablet by mouth daily.        . niacin (NIASPAN) 1000 MG CR tablet TAKE 1 TABLET BY MOUTH AT BEDTIME  30 tablet  6  . NON FORMULARY BIPAP therapy      . omeprazole (PRILOSEC) 20 MG capsule TAKE ONE CAPSULE BY MOUTH DAILY  30 capsule  3  . potassium citrate (UROCIT-K) 10 MEQ (1080 MG) SR tablet Take 10 mEq by mouth 3 (three) times daily with meals.        No current facility-administered medications on file prior to visit.     patient denies chest pain, shortness of breath, orthopnea. Denies lower extremity edema, abdominal pain, change in appetite, change in bowel movements. Patient denies rashes, musculoskeletal complaints. No other specific complaints in a complete review of systems.   BP 138/74  Pulse 60  Temp(Src) 98 F (36.7 C) (Oral)  Ht 6' (1.829 m)  Wt 259 lb (117.482 kg)  BMI 35.12 kg/m2 Well-developed male in no acute  distress. HEENT exam atraumatic, normocephalic, extraocular muscles are intact. Conjunctivae are pink without exudate. Neck is supple without lymphadenopathy, thyromegaly, jugular venous distention. Chest is clear to auscultation without increased work of breathing. Cardiac exam S1-S2 are regular. The PMI is normal. No significant murmurs or gallops. Abdominal exam active bowel sounds, soft, nontender. No abdominal bruits. Extremities no clubbing cyanosis or edema. Peripheral pulses are normal without bruits. Neurologic exam alert and oriented without any motor or sensory deficits. Rectal exam normal tone prostate normal size without masses or asymmetry.

## 2013-04-14 NOTE — Assessment & Plan Note (Signed)
Lipid Panel     Component Value Date/Time   CHOL 123 04/12/2013 0825   TRIG 229.0* 04/12/2013 0825   HDL 34.70* 04/12/2013 0825   CHOLHDL 4 04/12/2013 0825   VLDL 45.8* 04/12/2013 0825   LDLCALC 40 12/06/2012 0823  advised daily exercise Aggressive weight loss

## 2013-04-15 ENCOUNTER — Telehealth: Payer: Self-pay | Admitting: Cardiology

## 2013-04-15 ENCOUNTER — Ambulatory Visit: Payer: Medicare Other | Admitting: Internal Medicine

## 2013-04-15 NOTE — Telephone Encounter (Signed)
Message forwarded to S. Martin, RN.  

## 2013-04-15 NOTE — Telephone Encounter (Signed)
Wants to know if Trixie Dredge received the prior authorization for His Eliquiis?

## 2013-04-15 NOTE — Telephone Encounter (Signed)
Called for prior authorization- insurance company- answer prior questions - approved until 04/15/2014  Notified  [facility]- spoke to [place] - prior approved and verified.

## 2013-04-19 ENCOUNTER — Telehealth: Payer: Self-pay | Admitting: *Deleted

## 2013-04-19 NOTE — Telephone Encounter (Signed)
Faxed signed supply order back to [facility].

## 2013-04-29 ENCOUNTER — Ambulatory Visit: Payer: Medicare Other | Admitting: Cardiovascular Disease

## 2013-04-30 LAB — COMPREHENSIVE METABOLIC PANEL
ALT: 45 U/L (ref 0–53)
AST: 28 U/L (ref 0–37)
Albumin: 4.5 g/dL (ref 3.5–5.2)
Alkaline Phosphatase: 43 U/L (ref 39–117)
BUN: 21 mg/dL (ref 6–23)
CO2: 27 mEq/L (ref 19–32)
Calcium: 9.6 mg/dL (ref 8.4–10.5)
Chloride: 105 mEq/L (ref 96–112)
Creat: 1.58 mg/dL — ABNORMAL HIGH (ref 0.50–1.35)
Glucose, Bld: 128 mg/dL — ABNORMAL HIGH (ref 70–99)
Potassium: 4.6 mEq/L (ref 3.5–5.3)
Sodium: 144 mEq/L (ref 135–145)
Total Bilirubin: 0.9 mg/dL (ref 0.3–1.2)
Total Protein: 7 g/dL (ref 6.0–8.3)

## 2013-04-30 LAB — CBC
HCT: 40.1 % (ref 39.0–52.0)
Hemoglobin: 13.6 g/dL (ref 13.0–17.0)
MCH: 30.4 pg (ref 26.0–34.0)
MCHC: 33.9 g/dL (ref 30.0–36.0)
MCV: 89.5 fL (ref 78.0–100.0)
Platelets: 95 10*3/uL — ABNORMAL LOW (ref 150–400)
RBC: 4.48 MIL/uL (ref 4.22–5.81)
RDW: 14 % (ref 11.5–15.5)
WBC: 4.6 10*3/uL (ref 4.0–10.5)

## 2013-05-06 ENCOUNTER — Encounter: Payer: Self-pay | Admitting: Cardiovascular Disease

## 2013-05-06 ENCOUNTER — Telehealth: Payer: Self-pay | Admitting: Cardiovascular Disease

## 2013-05-06 ENCOUNTER — Ambulatory Visit (INDEPENDENT_AMBULATORY_CARE_PROVIDER_SITE_OTHER): Payer: Medicare Other | Admitting: Cardiovascular Disease

## 2013-05-06 VITALS — BP 154/76 | HR 55 | Ht 72.0 in | Wt 257.0 lb

## 2013-05-06 DIAGNOSIS — R609 Edema, unspecified: Secondary | ICD-10-CM

## 2013-05-06 DIAGNOSIS — Z0389 Encounter for observation for other suspected diseases and conditions ruled out: Secondary | ICD-10-CM

## 2013-05-06 DIAGNOSIS — IMO0001 Reserved for inherently not codable concepts without codable children: Secondary | ICD-10-CM

## 2013-05-06 DIAGNOSIS — I480 Paroxysmal atrial fibrillation: Secondary | ICD-10-CM

## 2013-05-06 DIAGNOSIS — E785 Hyperlipidemia, unspecified: Secondary | ICD-10-CM

## 2013-05-06 DIAGNOSIS — R002 Palpitations: Secondary | ICD-10-CM

## 2013-05-06 DIAGNOSIS — I4891 Unspecified atrial fibrillation: Secondary | ICD-10-CM

## 2013-05-06 DIAGNOSIS — I10 Essential (primary) hypertension: Secondary | ICD-10-CM

## 2013-05-06 MED ORDER — PROPAFENONE HCL 150 MG PO TABS: 150.0000 mg | ORAL_TABLET | Freq: Three times a day (TID) | ORAL | Status: DC

## 2013-05-06 MED ORDER — CARVEDILOL 12.5 MG PO TABS: 12.5000 mg | ORAL_TABLET | Freq: Two times a day (BID) | ORAL | Status: DC

## 2013-05-06 NOTE — Patient Instructions (Signed)
Your physician has recommended you make the following change in your medication: carvedilol changed to 12.5 mg twice daily. Start propafenone 150 mg as directed. This has already been sent to the pharmacy.  Your physician recommends that you schedule a follow-up appointment in: 4 WEEKS.

## 2013-05-06 NOTE — Telephone Encounter (Signed)
Just saw Dr Cathlean Marseilles clarification on his medicine.

## 2013-05-06 NOTE — Progress Notes (Signed)
Patient ID: Martin Cordova, male   DOB: [birth year], 72 y.o.   MRN: [id number]     HPI: Martin Cordova, is a 72 y.o. male is who presents to the office today for cardiology followup evaluation.   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 In the past, he developed an obstructive uropathy attributed to kidney stones; creatinine had risen up to 3 and ultimately improved and stabilized at approximately 1.5. Earlier this year with his blood pressure being elevated I added amlodipine 5 mg to his medical regimen.  Martin Cordova uses his BiPAP daily and does note good sleep. These does complain of ankle swelling. When I last saw him in June I suggested he increase his furosemide and take 20 mg every other day to help control some of his peripheral edema. He had been previously taken off his lisinopril when his creatinine had risen in the setting of his obstructive uropathy. He underwent laboratory in July which showed a total cholesterol 108 triglycerides 183 HDL 31 LDL 40 but LDL particle number was not obtained. Hemoglobin A1c was elevated at 6.8. In January when taken by Dr. Delsa Bern was 6.7. Between January and June he did have nutritional consultation with no improvement in his hemoglobin A1c. Review of the chart indicates that his HbA1c has been in the 6.5 range for almost 3 years. When I last saw him one month ago he was complaining of episodic palpitations. These seemingly have become worse and seem to be occurring every other day. They typically occur in the late afternoon or early morning. He's not had any change in caffeine intake. He denies associated chest pressure or presyncope. At that time I discontinued his amlodipine substituted this with Cardizem CD 120 mg. I had him wear a [employer] event monitor. Apparently, he  has been detected to have recurrent episodes of paroxysmal atrial fibrillation. As result, 2 weeks ago he was started on eloquence 5 mg daily and was advised to discontinue his aspirin. He states that he is now been asked still experiencing episodes of burst of probable atrial fibrillation every 3-4 days. These are shorter duration. He presents to the office for evaluation.  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 He has been on CPAP therapy initially for approximately 8 years and has been on BiPAP auto [place] for over 4 years. Recently his machine has been malfunctioning and he has been having system occurs. Cause of machine malfunction he is in need for a new machine. [facility] is [facility] seeing [facility]. I will write a prescription for a new change to treat his complex sleep apnea as current settings and will then request a download on therapy following treatment.  Past Medical History  Diagnosis Date  . GERD 12/07/2006  . HYPERGLYCEMIA 12/07/2006  . HYPERLIPIDEMIA 12/07/2006    mixed wth an atherogenic dyslipidemic pattern  . HYPERTENSION 12/07/2006  . OBESITY 11/10/2009  . THROMBOCYTOPENIA 12/07/2006  . TRANSAMINASES, SERUM, ELEVATED 12/07/2006  . SLEEP APNEA 10/22/2007    uses bipap  . 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 Claiborne Billings  . Diabetes mellitus without complication   . Renal insufficiency  Cr to 3 on ACE  . Nephrolithiasis   . Normal coronary arteries 04/06/2007    by cath EF 55%., last ECHO 10/13/10 EF>55% mild MR, last nuc 06/16/10 EF 57% low risk scan  . Pulmonary HTN     RV syst.  40-39mmHg- moderate by echo  . Palpitations     tachy  . PAF (paroxysmal atrial fibrillation) 04/09/2013    Past Surgical History  Procedure Laterality Date  . Cholecystectomy    . Rotator cuff repair    . Cystoscopy/retrograde/ureteroscopy   08/19/2011    Procedure: CYSTOSCOPY/RETROGRADE/URETEROSCOPY;  Surgeon: Hanley Ben, MD;  Location: [facility];  Service: Urology;  Laterality: Right;  JJ STENT PLACEMENT   . Cardiac catheterization  04/06/2007    normal cors    Allergies  Allergen Reactions  . Lisinopril     Renal insufficiency    Current Outpatient Prescriptions  Medication Sig Dispense Refill  . apixaban (ELIQUIS) 5 MG TABS tablet Take 1 tablet (5 mg total) by mouth 2 (two) times daily.  28 tablet  0  . atorvastatin (LIPITOR) 10 MG tablet Take 10 mg by mouth daily.      . carvedilol (COREG) 25 MG tablet TAKE 2 TABLETS TWICE A DAY  120 tablet  5  . cetirizine (ZYRTEC) 10 MG tablet Take 10 mg by mouth daily.      . Cholecalciferol (VITAMIN D3) 1000 UNITS CAPS Take 2,000 Units by mouth daily.       Marland Kitchen diltiazem (CARDIZEM CD) 120 MG 24 hr capsule Take at night.  30 capsule  6  . furosemide (LASIX) 20 MG tablet Take 20 mg by mouth every other day. Swelling      . metFORMIN (GLUCOPHAGE) 500 MG tablet Take 1 tablet (500 mg total) by mouth 2 (two) times daily with a meal.  60 tablet  6  . Multiple Vitamin (MULTIVITAMIN) tablet Take 1 tablet by mouth daily.        . niacin (NIASPAN) 1000 MG CR tablet TAKE 1 TABLET BY MOUTH AT BEDTIME  30 tablet  6  . NON FORMULARY BIPAP therapy      . omeprazole (PRILOSEC) 20 MG capsule TAKE ONE CAPSULE BY MOUTH DAILY  30 capsule  3  . potassium citrate (UROCIT-K) 10 MEQ (1080 MG) SR tablet Take 10 mEq by mouth 3 (three) times daily with meals.        No current facility-administered medications for this visit.    Socially he is married has 4 children and 2 grandchildren. He is a distant relative to the "Payette brothers." He does try to walk and exercise. There is no tobacco use. He does drink alcohol to  ROS is notable for ankle swelling he denies fever chills night sweats. He does wear glasses. He denies skin rash. He denies difficulty in hearing. He is unaware  lymphadenopathy. He denies chest pain. He has been using his BiPAP therapy with 100% compliance. He denies PND or orthopnea. He denies breakthrough snoring. He denies residual daytime sleepiness. He denies restless legs. He denies abdominal pain. He denies nausea vomiting. He denies urinary changes. He denies blood in his stool. He is unaware of anemia. His renal function has stabilized to a creatinine in the 1.5-1.6 range. He denies bleeding indigestion or paresthesias. He denies claudication. He does have difficulty with erectile function. His peripheral edema has improved with discontinuance of amlodipine and substituting with Cardizem. Other comprehensive 12 point system review is negative.  PE BP 154/76  Pulse 55  Ht 6' (1.829 m)  Wt 257 lb (116.574 kg)  BMI 34.85 kg/m2  Repeat blood pressure 140/88. General: Alert, oriented, no distress.  HEENT: Normocephalic, atraumatic. Pupils round and reactive; sclera anicteric;  Nose without nasal septal hypertrophy Mouth/Parynx benign; Mallinpatti scale 3  Neck: Thick, no JVD, no carotid briuts Lungs: clear to ausculatation and percussion; no wheezing or rales Heart: RRR, s1 s2 normal 1/6 sem; no ectopy heard on auscultation Abdomen: Moderate central adiposity ;soft, nontender; no hepatosplenomehaly, BS+; abdominal aorta nontender and not dilated by palpation. Pulses 2+ Extremities: 1+ bilateral ankle swelling, Homan's sign negative  Neurologic: grossly nonfocal  ECG: Sinus rhythm at 55 beats per minute. PR interval 164 ms. QT interval 397 ms.  LABS:  BMET    Component Value Date/Time   NA 144 04/30/2013 0923   K 4.6 04/30/2013 0923   CL 105 04/30/2013 0923   CO2 27 04/30/2013 0923   GLUCOSE 128* 04/30/2013 0923   BUN 21 04/30/2013 0923   CREATININE 1.58* 04/30/2013 0923   CREATININE 1.6* 04/12/2013 0825   CALCIUM 9.6 04/30/2013 0923   CALCIUM 9.7 07/03/2012 0942   GFRNONAA 46.00 02/02/2010 0808   GFRAA  Value: 45        The eGFR has  been calculated using the MDRD equation. This calculation has not been validated in all clinical situations. eGFR's persistently <60 mL/min signify possible Chronic Kidney Disease.* 09/25/2008 0352     Hepatic Function Panel     Component Value Date/Time   PROT 7.0 04/30/2013 0923   ALBUMIN 4.5 04/30/2013 0923   AST 28 04/30/2013 0923   ALT 45 04/30/2013 0923   ALKPHOS 43 04/30/2013 0923   BILITOT 0.9 04/30/2013 0923   BILIDIR 0.2 04/12/2013 0825   IBILI 1.1* 09/25/2008 0352     CBC    Component Value Date/Time   WBC 4.6 04/30/2013 0923   RBC 4.48 04/30/2013 0923   HGB 13.6 04/30/2013 0923   HCT 40.1 04/30/2013 0923   PLT 95* 04/30/2013 0923   MCV 89.5 04/30/2013 0923   MCH 30.4 04/30/2013 0923   MCHC 33.9 04/30/2013 0923   RDW 14.0 04/30/2013 0923   LYMPHSABS 0.9 07/03/2012 0942   MONOABS 0.4 07/03/2012 0942   EOSABS 0.1 07/03/2012 0942   BASOSABS 0.0 07/03/2012 0942     BNP No results found for this basename: probnp    Lipid Panel     Component Value Date/Time   CHOL 123 04/12/2013 0825   TRIG 229.0* 04/12/2013 0825   HDL 34.70* 04/12/2013 0825   CHOLHDL 4 04/12/2013 0825   VLDL 45.8* 04/12/2013 0825   LDLCALC 40 12/06/2012 0823     RADIOLOGY: No results found.    ASSESSMENT AND PLAN: Mr. Barona has documented normal coronary arteries by heart catheterization in 2008. He has a history of hypertension, mixed hyperlipidemia, and recently has been found to have paroxysmal atrial fibrillation. He is now eloquence anticoagulation 5 mg twice a day. He's not having signs of bleeding. He did have an episode of atrial fibrillation with a controlled ventricular response detected on 04/08/2013. He states he had another episode last night but typically over the past 2 weeks he's been having short-lived episodes every 3-4 days. At this point, I am electing to add antiarrhythmic therapy and will start him on Propulsid on 150 mg every 8 hours. Since he is already bradycardic I will  reduce his carvedilol 25 twice a day to 12.5 twice a day. He does  have complex sleep apnea and has used BiPAP OSB currently at 21/17 with a backup 11 breaths per minute rate. A right human prescription so that he can in the machine due to system failure is present one. I will see him back in the office in 4 weeks for followup evaluation. His renal function has stabilized and is now staying in the 1.5-1.6 range.  Troy Sine, MD, [facility]  05/06/2013 8:38 AM

## 2013-05-06 NOTE — Telephone Encounter (Signed)
Dr Claiborne Billings saw the patient this morning and wanted to decrease the dose of Coreg by half.  Dr Claiborne Billings thought the patient was taking Coreg 25mg  BID, but he was actually taking 50mg  BID.  I advised patient to cut back to 25mg  BID (not 12.5mg  BID).  Pt voiced understanding.

## 2013-05-07 ENCOUNTER — Encounter: Payer: Self-pay | Admitting: Cardiovascular Disease

## 2013-05-13 ENCOUNTER — Telehealth: Payer: Self-pay | Admitting: Cardiovascular Disease

## 2013-05-13 MED ORDER — METFORMIN HCL ER 500 MG PO TB24: ORAL_TABLET | ORAL | Status: DC

## 2013-05-13 NOTE — Telephone Encounter (Signed)
Prescription for Metformin ER - advised pt to start with 500mg  qd then inrcrease to either 1000mg  qd or 500mg  bid after 1 week.

## 2013-05-13 NOTE — Telephone Encounter (Signed)
Changed patient's metformin dose per Kristen's instructions. E-scribed to pharmacy.

## 2013-05-13 NOTE — Telephone Encounter (Signed)
Ok to switch to metformin ER 500mg  bid.  Was designed to be less irritating to the GI.  Erasmo Downer

## 2013-05-13 NOTE — Telephone Encounter (Signed)
Left message that will discuss with Dr Claiborne Billings to see what needs to be done.  Dr Claiborne Billings started patient on medication on 04/04/13.  will contact patient later.

## 2013-05-13 NOTE — Telephone Encounter (Signed)
He is on Metformin and would like to be changed to the extended release.The regular is hurting his stomach.

## 2013-05-13 NOTE — Telephone Encounter (Signed)
Please make notation of patient's metformin change. Thanks.

## 2013-05-22 ENCOUNTER — Telehealth: Payer: Self-pay | Admitting: Cardiovascular Disease

## 2013-05-22 MED ORDER — CARVEDILOL 25 MG PO TABS: 25.0000 mg | ORAL_TABLET | Freq: Two times a day (BID) | ORAL | Status: DC

## 2013-05-22 NOTE — Telephone Encounter (Signed)
This note on Dr. Evette Georges cart.

## 2013-05-22 NOTE — Telephone Encounter (Signed)
Returned call and pt verified x 2.  Pt with c/o palpitations and skipped beats.  Wants to know if Dr. Claiborne Billings will change his medicine.  Stated he was on carvedilol 50 mg twice daily and Dr. Claiborne Billings decreased it to 25 mg twice daily.  Pt also stated he was started on Rhythmol.  Pt wants to know if Dr. Claiborne Billings will change his medicine b/c he is having the irregular beats more frequently.  Pt stated he can feel when it beats fast and feels tired.  RN asked pt to check BP while on phone.  BP 149/83 HR 102 (standing).  Pt informed Dr. Claiborne Billings would be notified for further instructions.  Pt verbalized understanding and agreed w/ plan.  Message forwarded to Dr. Claiborne Billings.

## 2013-05-22 NOTE — Telephone Encounter (Signed)
Please call-need to talk to somebody about adjusting his medicine.He does not know which one it is,said he was on several.

## 2013-05-22 NOTE — Telephone Encounter (Signed)
Is pt taking propafenone 150 every 8 hrs, if P >60 then increase carvedilol to 18.75 bid, and if symproms continue cantitrate bak to 25 bid if P>60.

## 2013-05-23 MED ORDER — CARVEDILOL 25 MG PO TABS: ORAL_TABLET | ORAL | Status: DC

## 2013-05-23 NOTE — Telephone Encounter (Signed)
Pt is taking propafenone 150 mg every 8 hours and HR>60.  Pt is also taking carvedilol 25 mg BID.  Tarri Fuller, PA-C notified (Dr. Claiborne Billings out of the office) and advised pt increase AM dose of carvedilol to 37.5 mg and keep PM dose at 25 mg.  Returned call.  Left message on home phone to call back before 4pm.  Returned call and pt verified x 2 (mobile).  Advice given per PA and pt verbalized understanding.  Pt advised to call back in a couple of days to inform RN if improving so Rx can be updated w/ pharmacy.  Pt verbalized understanding and agreed w/ plan.

## 2013-06-04 ENCOUNTER — Encounter: Payer: Self-pay | Admitting: Cardiovascular Disease

## 2013-06-04 ENCOUNTER — Ambulatory Visit (INDEPENDENT_AMBULATORY_CARE_PROVIDER_SITE_OTHER): Payer: Medicare Other | Admitting: Cardiovascular Disease

## 2013-06-04 VITALS — BP 144/102 | HR 105 | Ht 72.0 in | Wt 257.8 lb

## 2013-06-04 DIAGNOSIS — N289 Disorder of kidney and ureter, unspecified: Secondary | ICD-10-CM

## 2013-06-04 DIAGNOSIS — I480 Paroxysmal atrial fibrillation: Secondary | ICD-10-CM

## 2013-06-04 DIAGNOSIS — G4730 Sleep apnea, unspecified: Secondary | ICD-10-CM

## 2013-06-04 DIAGNOSIS — R7989 Other specified abnormal findings of blood chemistry: Secondary | ICD-10-CM

## 2013-06-04 DIAGNOSIS — I10 Essential (primary) hypertension: Secondary | ICD-10-CM

## 2013-06-04 DIAGNOSIS — I4891 Unspecified atrial fibrillation: Secondary | ICD-10-CM

## 2013-06-04 DIAGNOSIS — E785 Hyperlipidemia, unspecified: Secondary | ICD-10-CM

## 2013-06-04 MED ORDER — CARVEDILOL 25 MG PO TABS: 50.0000 mg | ORAL_TABLET | Freq: Two times a day (BID) | ORAL | Status: DC

## 2013-06-04 NOTE — Progress Notes (Signed)
Patient ID: Martin Cordova, male   DOB: 12-05-1940, 72 y.o.   MRN: [id number]      HPI: Martin Cordova, is a 72 y.o. male is who presents to the office today for cardiology followup evaluation.   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 In the past, he developed an obstructive uropathy attributed to kidney stones; creatinine had risen up to 3 and ultimately improved and stabilized at approximately 1.5. Earlier this year with his blood pressure being elevated I added amlodipine 5 mg to his medical regimen.  Martin Cordova uses his BiPAP daily and does note good sleep. These does complain of ankle swelling. When I l saw him in June I suggested he increase his furosemide and take 20 mg every other day to help control some of his peripheral edema. He had been previously taken off his lisinopril when his creatinine had risen in the setting of his obstructive uropathy. He underwent laboratory in July which showed a total cholesterol 108 triglycerides 183 HDL 31 LDL 40 but LDL particle number was not obtained. Hemoglobin A1c was elevated at 6.8. In January when taken by Dr. Delsa Bern was 6.7. Between January and June he did have nutritional consultation with no improvement in his hemoglobin A1c. Review of the chart indicates that his HbA1c has been in the 6.5 range for almost 3 years. When I saw him several months ago he was complaining of episodic palpitations. These were becoming worse and seem to be occurring every other day;  typically occur in the late afternoon or early morning. He's not had any change in caffeine intake. He denies associated chest pressure or presyncope. At that time I discontinued his amlodipine substituted this with Cardizem CD 120 mg. I had him wear a CardioNet event monitor. On monitor he  was found to  have recurrent episodes of paroxysmal atrial fibrillation. As result,  he was started on Eliquis 5 mg bid and was advised to discontinue his aspirin. He states that he is now been asked still experiencing episodes of burst of probable atrial fibrillation every 3-4 days. When I saw him one month ago because of his complaints of still experiencing recurrent episodes I elected to initiate antiarrhythmic therapy with Rythmol 150 mg every 8 hours. At that time, he was in sinus rhythm with bradycardia at 55 and I recommended a dose reduction of his carvedilol from 50 twice a day down to initially 25 twice a day. Subsequently, on the reduced dose he did note some additional palpitations and his dose was adjusted to 37.5 mg in the morning and 25 mg at night. Martin Cordova still experiences recurrent episodes of palpitations. He presents to the office today for evaluation.  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 He has been on CPAP therapy initially for approximately 8 years and has been on BiPAP auto [place] for over 4 years. He now has a new fullface mask. He is awaiting a new machine.  Past Medical History  Diagnosis Date  . GERD 12/07/2006  . HYPERGLYCEMIA 12/07/2006  . HYPERLIPIDEMIA 12/07/2006    mixed wth an atherogenic dyslipidemic pattern  . HYPERTENSION 12/07/2006  .  OBESITY 11/10/2009  . THROMBOCYTOPENIA 12/07/2006  . TRANSAMINASES, SERUM, ELEVATED 12/07/2006  . SLEEP APNEA 10/22/2007    uses bipap  . 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 Claiborne Billings  . Diabetes mellitus without complication   . Renal insufficiency     Cr to 3 on ACE  . Nephrolithiasis   . Normal coronary arteries 04/06/2007    by cath EF 55%., last ECHO 10/13/10 EF>55% mild MR, last nuc 06/16/10 EF 57% low risk scan  . Pulmonary HTN     RV syst.  40-85mmHg- moderate by echo  .  Palpitations     tachy  . PAF (paroxysmal atrial fibrillation) 04/09/2013    Past Surgical History  Procedure Laterality Date  . Cholecystectomy    . Rotator cuff repair    . Cystoscopy/retrograde/ureteroscopy  08/19/2011    Procedure: CYSTOSCOPY/RETROGRADE/URETEROSCOPY;  Surgeon: Hanley Ben, MD;  Location: [facility];  Service: Urology;  Laterality: Right;  JJ STENT PLACEMENT   . Cardiac catheterization  04/06/2007    normal cors    Allergies  Allergen Reactions  . Lisinopril     Renal insufficiency    Current Outpatient Prescriptions  Medication Sig Dispense Refill  . apixaban (ELIQUIS) 5 MG TABS tablet Take 1 tablet (5 mg total) by mouth 2 (two) times daily.  28 tablet  0  . atorvastatin (LIPITOR) 10 MG tablet Take 10 mg by mouth daily.      . carvedilol (COREG) 25 MG tablet Take 1 & 1/2 tabs (37.5 mg) in the morning and 1 tab (25 mg) in the evening  105 tablet  0  . cetirizine (ZYRTEC) 10 MG tablet Take 10 mg by mouth daily.      . Cholecalciferol (VITAMIN D3) 1000 UNITS CAPS Take 2,000 Units by mouth daily.       Marland Kitchen diltiazem (CARDIZEM CD) 120 MG 24 hr capsule Take at night.  30 capsule  6  . furosemide (LASIX) 20 MG tablet Take 20 mg by mouth daily as needed for edema. Swelling      . metFORMIN (GLUCOPHAGE-XR) 500 MG 24 hr tablet Take 500 mg by mouth 2 (two) times daily.      . Multiple Vitamin (MULTIVITAMIN) tablet Take 1 tablet by mouth daily.        . niacin (NIASPAN) 1000 MG CR tablet TAKE 1 TABLET BY MOUTH AT BEDTIME  30 tablet  6  . NON FORMULARY BIPAP therapy      . omeprazole (PRILOSEC) 20 MG capsule TAKE ONE CAPSULE BY MOUTH DAILY  30 capsule  3  . potassium citrate (UROCIT-K) 10 MEQ (1080 MG) SR tablet Take 10 mEq by mouth 3 (three) times daily with meals.       . propafenone (RYTHMOL) 150 MG tablet Take 1 tablet (150 mg total) by mouth every 8 (eight) hours.  60 tablet  6   No current facility-administered medications for this visit.     Socially he is married has 4 children and 2 grandchildren. He is a distant relative to the "[place] brothers." He does try to walk and exercise. There is no tobacco use. He does drink alcohol to  ROS is notable for ankle swelling he denies fever chills night sweats. He does wear glasses. He denies skin rash. He denies difficulty in hearing. He is unaware lymphadenopathy. He continues to experience palpitations as well as his pulse at times being in the low 100s. His chest pressure or chest pain. He has been  using his BiPAP therapy with 100% compliance. He denies PND or orthopnea. He denies breakthrough snoring. He denies residual daytime sleepiness. He denies restless legs. He denies abdominal pain. He denies nausea vomiting. He denies urinary changes. He denies blood in his stool. He is unaware of anemia. His renal function has stabilized to a creatinine in the 1.5-1.6 range. He denies bleeding indigestion or paresthesias. He denies claudication. He does have difficulty with erectile function. He denies recent peripheral edema, and this markedly improved with discontinuance of his amlodipine. Other comprehensive 14 point system review is negative.   PE BP 144/102  Pulse 105  Ht 6' (1.829 m)  Wt 257 lb 12.8 oz (116.937 kg)  BMI 34.96 kg/m2  Repeat blood pressure 124/88 General: Alert, oriented, no distress.  HEENT: Normocephalic, atraumatic. Pupils round and reactive; sclera anicteric;  Nose without nasal septal hypertrophy Mouth/Parynx benign; Mallinpatti scale 3  Neck: Thick, no JVD, no carotid briuts Lungs: clear to ausculatation and percussion; no wheezing or rales Heart: Tachycardic at approximately 105 beats per minute. Occasional ectopy, s1 s2 normal 1/6 sem; no ectopy heard on auscultation Abdomen: Moderate central adiposity ;soft, nontender; no hepatosplenomehaly, BS+; abdominal aorta nontender and not dilated by palpation. Pulses 2+ Extremities: 1+ bilateral ankle swelling,  Homan's sign negative  Neurologic: grossly nonfocal  ECG: EKG today suggests probable atrial flutter with a ventricular rate of 105 beats per minute. There also are frequent PVCs. QTc interval is 436 ms. They're nonspecific T changes.  LABS:  BMET    Component Value Date/Time   NA 144 04/30/2013 0923   K 4.6 04/30/2013 0923   CL 105 04/30/2013 0923   CO2 27 04/30/2013 0923   GLUCOSE 128* 04/30/2013 0923   BUN 21 04/30/2013 0923   CREATININE 1.58* 04/30/2013 0923   CREATININE 1.6* 04/12/2013 0825   CALCIUM 9.6 04/30/2013 0923   CALCIUM 9.7 07/03/2012 0942   GFRNONAA 46.00 02/02/2010 0808   GFRAA  Value: 45        The eGFR has been calculated using the MDRD equation. This calculation has not been validated in all clinical situations. eGFR's persistently <60 mL/min signify possible Chronic Kidney Disease.* 09/25/2008 0352     Hepatic Function Panel     Component Value Date/Time   PROT 7.0 04/30/2013 0923   ALBUMIN 4.5 04/30/2013 0923   AST 28 04/30/2013 0923   ALT 45 04/30/2013 0923   ALKPHOS 43 04/30/2013 0923   BILITOT 0.9 04/30/2013 0923   BILIDIR 0.2 04/12/2013 0825   IBILI 1.1* 09/25/2008 0352     CBC    Component Value Date/Time   WBC 4.6 04/30/2013 0923   RBC 4.48 04/30/2013 0923   HGB 13.6 04/30/2013 0923   HCT 40.1 04/30/2013 0923   PLT 95* 04/30/2013 0923   MCV 89.5 04/30/2013 0923   MCH 30.4 04/30/2013 0923   MCHC 33.9 04/30/2013 0923   RDW 14.0 04/30/2013 0923   LYMPHSABS 0.9 07/03/2012 0942   MONOABS 0.4 07/03/2012 0942   EOSABS 0.1 07/03/2012 0942   BASOSABS 0.0 07/03/2012 0942     BNP No results found for this basename: probnp    Lipid Panel     Component Value Date/Time   CHOL 123 04/12/2013 0825   TRIG 229.0* 04/12/2013 0825   HDL 34.70* 04/12/2013 0825   CHOLHDL 4 04/12/2013 0825   VLDL 45.8* 04/12/2013 0825   LDLCALC 40 12/06/2012 0823     RADIOLOGY: No results found.    ASSESSMENT AND PLAN: Mr.  Cordova has documented normal coronary  arteries by heart catheterization in 2008. He has a history of hypertension, mixed hyperlipidemia, and recently has been found to have paroxysmal atrial fibrillation. His ECG today shows ventricular rate of 105 beats per minute. He may be in atrial fibrillation with coarse ventilatory waves versus atrial flutter. He also is having occasional PVCs. QTc interval is normal. Presently, recommending any further titrate his carvedilol to 50 mg twice a day. I will keep him on his current dose of Rythmol 150 mg every 8 hours. This may need to be increased to 225 every 8 hours but I will not do this presently. He will monitor his pulse rate and if his pulse goes below 50 he will reduce his carvedilol dose to 25 for that next dose but otherwise continue at the higher dose regimen. I will see him back in the office in 4 weeks for followup evaluation. With reference to his complex sleep apnea, he is tolerating his new mask but may be adjusting this too tightly. I discussed with him the benefits of the cushion in the mask and if it is too firm this may actually exacerbate mask leak. He is awaiting arrival of his new machine. I'll see him in 4 weeks for followup evaluation.  Time spent:25 min   Troy Sine, MD, [facility]  06/04/2013 9:38 AM

## 2013-06-04 NOTE — Patient Instructions (Signed)
Your physician recommends that you schedule a follow-up appointment in: 4 weeks with Dr Claiborne Billings  INCREASE COREG TO 50 MG TWICE A DAY ( TWO 25 MG TABLETS TWICE A DAY )

## 2013-06-06 LAB — HM DIABETES EYE EXAM

## 2013-06-23 ENCOUNTER — Other Ambulatory Visit: Payer: Self-pay | Admitting: Internal Medicine

## 2013-07-08 ENCOUNTER — Encounter: Payer: Self-pay | Admitting: Cardiovascular Disease

## 2013-07-08 ENCOUNTER — Other Ambulatory Visit: Payer: Self-pay | Admitting: *Deleted

## 2013-07-08 ENCOUNTER — Telehealth: Payer: Self-pay | Admitting: Cardiovascular Disease

## 2013-07-08 ENCOUNTER — Ambulatory Visit (INDEPENDENT_AMBULATORY_CARE_PROVIDER_SITE_OTHER): Payer: Medicare Other | Admitting: Cardiovascular Disease

## 2013-07-08 VITALS — BP 140/100 | HR 98 | Ht 72.0 in | Wt 259.3 lb

## 2013-07-08 DIAGNOSIS — I4891 Unspecified atrial fibrillation: Secondary | ICD-10-CM

## 2013-07-08 DIAGNOSIS — E785 Hyperlipidemia, unspecified: Secondary | ICD-10-CM

## 2013-07-08 DIAGNOSIS — I10 Essential (primary) hypertension: Secondary | ICD-10-CM

## 2013-07-08 DIAGNOSIS — E669 Obesity, unspecified: Secondary | ICD-10-CM

## 2013-07-08 DIAGNOSIS — G4730 Sleep apnea, unspecified: Secondary | ICD-10-CM

## 2013-07-08 DIAGNOSIS — N289 Disorder of kidney and ureter, unspecified: Secondary | ICD-10-CM

## 2013-07-08 DIAGNOSIS — I480 Paroxysmal atrial fibrillation: Secondary | ICD-10-CM

## 2013-07-08 MED ORDER — PROPAFENONE HCL 225 MG PO TABS: 225.0000 mg | ORAL_TABLET | Freq: Three times a day (TID) | ORAL | Status: DC

## 2013-07-08 NOTE — Progress Notes (Signed)
Patient ID: Martin Cordova, male   DOB: [birth year], 73 y.o.   MRN: [id number]      HPI: Martin Cordova, is a 73 y.o. male is who presents to the office today for cardiology followup evaluation.   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 In the past, he developed an obstructive uropathy attributed to kidney stones; creatinine had risen up to 3 and ultimately improved and stabilized at approximately 1.5. Earlier this year with his blood pressure being elevated I added amlodipine 5 mg to his medical regimen.  Martin Cordova uses his BiPAP daily and does note good sleep. These does complain of ankle swelling. When I l saw him in June I suggested he increase his furosemide and take 20 mg every other day to help control some of his peripheral edema. He had been previously taken off his lisinopril when his creatinine had risen in the setting of his obstructive uropathy. He underwent laboratory in July which showed a total cholesterol 108 triglycerides 183 HDL 31 LDL 40 but LDL particle number was not obtained. Hemoglobin A1c was elevated at 6.8. In January when taken by Martin Cordova was 6.7. Between January and June he did have nutritional consultation with no improvement in his hemoglobin A1c. Review of the chart indicates that his HbA1c has been in the 6.5 range for almost 3 years.  When I saw him several months ago he was complaining of episodic palpitations. These were becoming worse and seem to be occurring every other day;  typically occur in the late afternoon or early morning. He's not had any change in caffeine intake. He denies associated chest pressure or presyncope. At that time I discontinued his amlodipine substituted this with Cardizem CD 120 mg. I had him wear a CardioNet event monitor. On monitor  he was found to  have recurrent episodes of paroxysmal atrial fibrillation. As result,  he was started on Eliquis 5 mg bid and was advised to discontinue his aspirin. He states that he is now been asked still experiencing episodes of burst of probable atrial fibrillation every 3-4 days. When I saw him 2  months ago because of his complaints of still experiencing recurrent episodes I elected to initiate antiarrhythmic therapy with Rythmol 150 mg every 8 hours. At that time, he was in sinus rhythm with bradycardia at 55 and I recommended a dose reduction of his carvedilol from 50 twice a day down to initially 25 twice a day. Subsequently, on the reduced dose he did note some additional palpitations and his dose was adjusted to 37.5 mg in the morning and 25 mg at night.   I last saw him one month ago and at that time Martin Cordova was most likely in atrial fib/flutter rhythm with a ventricular rate in the low 100's. .  I further titrated his carvedilol back to 50 mg twice a day. He has felt improved on this regimen. He denies any awareness that his pulse is irregular or very fast. He also was hypertensive with a blood pressure of 144/102.   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 He has been on CPAP therapy initially for approximately 8 years  and has been on BiPAP auto SV for over 4 years. He admits to using his BiPAP with 100% compliance.  Presently, he denies chest pain. He denies presyncope or syncope. He denies abdominal pain. He denies change in urination. He denies tremors.  Past Medical History  Diagnosis Date  . GERD 12/07/2006  . HYPERGLYCEMIA 12/07/2006  . HYPERLIPIDEMIA 12/07/2006    mixed wth an atherogenic dyslipidemic pattern  . HYPERTENSION 12/07/2006  . OBESITY 11/10/2009  . THROMBOCYTOPENIA 12/07/2006  . TRANSAMINASES, SERUM, ELEVATED 12/07/2006  . SLEEP APNEA 10/22/2007     uses bipap  . 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 Claiborne Billings  . Diabetes mellitus without complication   . Renal insufficiency     Cr to 3 on ACE  . Nephrolithiasis   . Normal coronary arteries 04/06/2007    by cath EF 55%., last ECHO 10/13/10 EF>55% mild MR, last nuc 06/16/10 EF 57% low risk scan  . Pulmonary HTN     RV syst.  40-37mHg- moderate by echo  . Palpitations     tachy  . PAF (paroxysmal atrial fibrillation) 04/09/2013    Past Surgical History  Procedure Laterality Date  . Cholecystectomy    . Rotator cuff repair    . Cystoscopy/retrograde/ureteroscopy  08/19/2011    Procedure: CYSTOSCOPY/RETROGRADE/URETEROSCOPY;  Surgeon: MHanley Ben MD;  [facility]  Service: Urology;  Laterality: Right;  JJ STENT PLACEMENT   . Cardiac catheterization  04/06/2007    normal cors    Allergies  Allergen Reactions  . Lisinopril     Renal insufficiency    Current Outpatient Prescriptions  Medication Sig Dispense Refill  . apixaban (ELIQUIS) 5 MG TABS tablet Take 1 tablet (5 mg total) by mouth 2 (two) times daily.  28 tablet  0  . atorvastatin (LIPITOR) 10 MG tablet Take 10 mg by mouth daily.      . carvedilol (COREG) 25 MG tablet Take 2 tablets (50 mg total) by mouth 2 (two) times daily with a meal.  120 tablet  11  . cetirizine (ZYRTEC) 10 MG tablet Take 10 mg by mouth daily.      . Cholecalciferol (VITAMIN D3) 1000 UNITS CAPS Take 2,000 Units by mouth daily.       .Marland Kitchendiltiazem (CARDIZEM CD) 120 MG 24 hr capsule Take at night.  30 capsule  6  . furosemide (LASIX) 20 MG tablet Take 20 mg by mouth daily as needed for edema. Swelling      . metFORMIN (GLUCOPHAGE-XR) 500 MG 24 hr tablet Take 500 mg by mouth 2 (two) times daily.      . Multiple Vitamin (MULTIVITAMIN) tablet Take 1 tablet by mouth daily.        . niacin (NIASPAN) 1000 MG CR tablet TAKE 1 TABLET BY MOUTH AT BEDTIME  30 tablet  6  . NON FORMULARY BIPAP therapy      . omeprazole  (PRILOSEC) 20 MG capsule TAKE ONE CAPSULE BY MOUTH DAILY  30 capsule  5  . potassium citrate (UROCIT-K) 10 MEQ (1080 MG) SR tablet Take 10 mEq by mouth 3 (three) times daily with meals.       . propafenone (RYTHMOL) 150 MG tablet Take 1 tablet (150 mg total) by mouth every 8 (eight) hours.  60 tablet  6   No current facility-administered medications for this visit.    Socially he is married has 4 children and 2 grandchildren. He is a distant relative to the "[place]  brothers." He does try to walk and exercise. There is no tobacco use. He does drink alcohol to  ROS is notable for ankle swelling he denies fever chills night sweats. He does wear glasses. He denies skin rash. He denies difficulty in hearing. He is unaware lymphadenopathy. He denies wheezing. He denies shortness of breath or cough. His palpitations have improved. His chest pressure or chest pain. He has been using his BiPAP therapy with 100% compliance. He denies PND or orthopnea. He denies breakthrough snoring. He denies residual daytime sleepiness. He denies restless legs. He denies abdominal pain. He denies nausea vomiting. He denies urinary changes. He denies blood in his stool. He is unaware of anemia. His renal function has stabilized to a creatinine in the 1.5-1.6 range. He denies bleeding and is tolerating eliquis. No  indigestion or paresthesias. He denies claudication. He does have difficulty with erectile function. He denies recent peripheral edema, and this markedly improved with discontinuance of his amlodipine. Other comprehensive 14 point system review is negative.   PE BP 140/100  Pulse 98  Ht 6' (1.829 m)  Wt 259 lb 4.8 oz (117.618 kg)  BMI 35.16 kg/m2  Repeat blood pressure 130/90. General: Alert, oriented, no distress.  HEENT: Normocephalic, atraumatic. Pupils round and reactive; sclera anicteric; no xanthelasmas. Extraocular muscles are full. There is no lid lag. Nose without nasal septal hypertrophy Mouth/Parynx  benign; Mallinpatti scale 3  Neck: Thick, no JVD, no carotid bruits Lungs: clear to ausculatation and percussion; no wheezing or rales Chest wall: No tenderness to palpation Heart: Ventricular rate seems regular heart rate in the mid-90s. No ectopy, s1 s2 normal 1/6 sem; no ectopy heard on auscultation Abdomen: Moderate central adiposity ;soft, nontender; no hepatosplenomehaly, BS+; abdominal aorta nontender and not dilated by palpation. Back: No CVA tenderness Pulses 2+ Extremities: Resolution of prior bilateral ankle swelling, Homan's sign negative  Neurologic: grossly nonfocal; cranial nerves intact. Psychological: Normal affect and mood  ECG (independently read by me): Probable A. fib flutter now with right bundle branch block and repolarization changes.  Prior ECG of 06/04/2013: EKG  suggests probable atrial flutter with a ventricular rate of 105 beats per minute. There also are frequent PVCs. QTc interval is 436 ms. They're nonspecific T changes.  LABS:  BMET    Component Value Date/Time   NA 144 04/30/2013 0923   K 4.6 04/30/2013 0923   CL 105 04/30/2013 0923   CO2 27 04/30/2013 0923   GLUCOSE 128* 04/30/2013 0923   BUN 21 04/30/2013 0923   CREATININE 1.58* 04/30/2013 0923   CREATININE 1.6* 04/12/2013 0825   CALCIUM 9.6 04/30/2013 0923   CALCIUM 9.7 07/03/2012 0942   GFRNONAA 46.00 02/02/2010 0808   GFRAA  Value: 45        The eGFR has been calculated using the MDRD equation. This calculation has not been validated in all clinical situations. eGFR's persistently <60 mL/min signify possible Chronic Kidney Disease.* 09/25/2008 0352     Hepatic Function Panel     Component Value Date/Time   PROT 7.0 04/30/2013 0923   ALBUMIN 4.5 04/30/2013 0923   AST 28 04/30/2013 0923   ALT 45 04/30/2013 0923   ALKPHOS 43 04/30/2013 0923   BILITOT 0.9 04/30/2013 0923   BILIDIR 0.2 04/12/2013 0825   IBILI 1.1* 09/25/2008 0352     CBC    Component Value Date/Time   WBC 4.6 04/30/2013  0923   RBC 4.48 04/30/2013 0923   HGB 13.6 04/30/2013 0923   HCT  40.1 04/30/2013 0923   PLT 95* 04/30/2013 0923   MCV 89.5 04/30/2013 0923   MCH 30.4 04/30/2013 0923   MCHC 33.9 04/30/2013 0923   RDW 14.0 04/30/2013 0923   LYMPHSABS 0.9 07/03/2012 0942   MONOABS 0.4 07/03/2012 0942   EOSABS 0.1 07/03/2012 0942   BASOSABS 0.0 07/03/2012 0942     BNP No results found for this basename: probnp    Lipid Panel     Component Value Date/Time   CHOL 123 04/12/2013 0825   TRIG 229.0* 04/12/2013 0825   HDL 34.70* 04/12/2013 0825   CHOLHDL 4 04/12/2013 0825   VLDL 45.8* 04/12/2013 0825   LDLCALC 40 12/06/2012 0823     RADIOLOGY: No results found.    ASSESSMENT AND PLAN: Martin Cordova has documented normal coronary arteries by heart catheterization in 2008. He has a history of hypertension, mixed hyperlipidemia, and has paroxysmal atrial fibrillation/flutter. Since I last saw him, his ventricular rate has improved and is now in the upper 90s as result of further titration of carvedilol back to 50 mg twice a day.Marland Kitchen His rhythm is regular suggesting possible atrial flutter. He is not in sinus rhythm presently. He has developed a right bundle branch block. At this time, I am recommending that he discontinue his diltiazem. I will further titrate his Rythmol to 225 mg every 8 hours. He is a large gentleman and weighs 259 pounds at 6 feet tall with a body mass index of 35.2. I will see him back in the office in approximately 3 weeks. I am also recommending a complete set of laboratory be checked consisting of a CBC, Cmet, magnesium level, thyroid function studies. We discussed potential for cardioversion if he is still in it he still is not a sinus rhythm at his next office visit and also discussed the potential for future electrophysiology ablation.  Time spent:25 min   Troy Sine, MD, [facility]  07/08/2013 9:18 AM

## 2013-07-08 NOTE — Telephone Encounter (Signed)
Received several call today,he does not not know who called.He was just here this morning to see Dr Claiborne Billings.

## 2013-07-08 NOTE — Patient Instructions (Signed)
Your physician has recommended you make the following change in your medication: stop the propafenone 150 mg. Start the new prescription for 225 mg.  Stop the cardizem. Your physician recommends that you schedule a follow-up appointment in: 3 weeks.

## 2013-07-08 NOTE — Telephone Encounter (Signed)
Spoke with Mariann Laster - she had tried to contact patient regarding labs. Will need blood work prior to next OV with Dr. Claiborne Billings. Notified patient - verbalized understanding.

## 2013-07-14 ENCOUNTER — Other Ambulatory Visit: Payer: Self-pay | Admitting: Cardiovascular Disease

## 2013-07-15 ENCOUNTER — Telehealth: Payer: Self-pay | Admitting: *Deleted

## 2013-07-15 MED ORDER — PROPAFENONE HCL 225 MG PO TABS: 225.0000 mg | ORAL_TABLET | Freq: Three times a day (TID) | ORAL | Status: DC

## 2013-07-15 NOTE — Telephone Encounter (Signed)
Returned call and pt verified x 2.  Pt informed message received and quantity will be updated.  Pt verbalized understanding and agreed w/ plan.  Refill(s) sent to pharmacy.

## 2013-07-15 NOTE — Telephone Encounter (Signed)
Pt was calling in regards to his propafenone medication dose. He stated that it was to be take 1 every 8 hours and he only received a quantity of 30 so it is not enough medication.

## 2013-07-19 LAB — COMPREHENSIVE METABOLIC PANEL
ALT: 39 U/L (ref 0–53)
AST: 26 U/L (ref 0–37)
Albumin: 3.9 g/dL (ref 3.5–5.2)
Alkaline Phosphatase: 39 U/L (ref 39–117)
BUN: 26 mg/dL — ABNORMAL HIGH (ref 6–23)
CO2: 29 mEq/L (ref 19–32)
Calcium: 9.5 mg/dL (ref 8.4–10.5)
Chloride: 106 mEq/L (ref 96–112)
Creat: 1.7 mg/dL — ABNORMAL HIGH (ref 0.50–1.35)
Glucose, Bld: 131 mg/dL — ABNORMAL HIGH (ref 70–99)
Potassium: 4.9 mEq/L (ref 3.5–5.3)
Sodium: 142 mEq/L (ref 135–145)
Total Bilirubin: 0.8 mg/dL (ref 0.2–1.2)
Total Protein: 6.9 g/dL (ref 6.0–8.3)

## 2013-07-19 LAB — MAGNESIUM: Magnesium: 1.8 mg/dL (ref 1.5–2.5)

## 2013-07-19 LAB — CBC
HCT: 42.2 % (ref 39.0–52.0)
Hemoglobin: 14.2 g/dL (ref 13.0–17.0)
MCH: 30.4 pg (ref 26.0–34.0)
MCHC: 33.6 g/dL (ref 30.0–36.0)
MCV: 90.4 fL (ref 78.0–100.0)
Platelets: 107 10*3/uL — ABNORMAL LOW (ref 150–400)
RBC: 4.67 MIL/uL (ref 4.22–5.81)
RDW: 14.7 % (ref 11.5–15.5)
WBC: 4.9 10*3/uL (ref 4.0–10.5)

## 2013-07-19 LAB — TSH: TSH: 2.518 u[IU]/mL (ref 0.350–4.500)

## 2013-07-29 ENCOUNTER — Ambulatory Visit (INDEPENDENT_AMBULATORY_CARE_PROVIDER_SITE_OTHER): Payer: Medicare Other | Admitting: Cardiovascular Disease

## 2013-07-29 ENCOUNTER — Encounter: Payer: Self-pay | Admitting: Cardiovascular Disease

## 2013-07-29 VITALS — BP 140/90 | HR 87 | Ht 72.0 in | Wt 260.1 lb

## 2013-07-29 DIAGNOSIS — I4729 Other ventricular tachycardia: Secondary | ICD-10-CM

## 2013-07-29 DIAGNOSIS — E669 Obesity, unspecified: Secondary | ICD-10-CM

## 2013-07-29 DIAGNOSIS — I4892 Unspecified atrial flutter: Secondary | ICD-10-CM | POA: Insufficient documentation

## 2013-07-29 DIAGNOSIS — I10 Essential (primary) hypertension: Secondary | ICD-10-CM

## 2013-07-29 DIAGNOSIS — E785 Hyperlipidemia, unspecified: Secondary | ICD-10-CM

## 2013-07-29 DIAGNOSIS — I472 Ventricular tachycardia: Secondary | ICD-10-CM

## 2013-07-29 DIAGNOSIS — G4730 Sleep apnea, unspecified: Secondary | ICD-10-CM

## 2013-07-29 DIAGNOSIS — Z01818 Encounter for other preprocedural examination: Secondary | ICD-10-CM

## 2013-07-29 NOTE — Patient Instructions (Signed)
Your physician has recommended that you have a Cardioversion (DCCV). Electrical Cardioversion uses a jolt of electricity to your heart either through paddles or wired patches attached to your chest. This is a controlled, usually prescheduled, procedure. Defibrillation is done under light anesthesia in the hospital, and you usually go home the day of the procedure. This is done to get your heart back into a normal rhythm. You are not awake for the procedure. Please see the instruction sheet given to you today. This will be done on March 2.  Your physician recommends that you return for lab work for your procedure.    A chest x-ray takes a picture of the organs and structures inside the chest, including the heart, lungs, and blood vessels. This test can show several things, including, whether the heart is enlarges; whether fluid is building up in the lungs; and whether pacemaker / defibrillator leads are still in place.  Your physician recommends that you schedule a follow-up appointment in: this will be given to you at the time of your discharge.

## 2013-07-29 NOTE — Progress Notes (Signed)
Patient ID: Martin Cordova, male   DOB: Sep 05, 1940, 73 y.o.   MRN: [id number]        HPI: Martin Cordova is a 73 y.o. male is who presents to the office today for cardiology followup evaluation of his A Flutter.  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 In the past, he developed an obstructive uropathy attributed to kidney stones; creatinine had risen up to 3 and ultimately improved and stabilized at approximately 1.5. Earlier this year with his blood pressure being elevated I added amlodipine 5 mg to his medical regimen.  Martin Cordova uses his BiPAP daily and does note good sleep. These does complain of ankle swelling. When I l saw him in June I suggested he increase his furosemide and take 20 mg every other day to help control some of his peripheral edema. He had been previously taken off his lisinopril when his creatinine had risen in the setting of his obstructive uropathy. He underwent laboratory in July which showed a total cholesterol 108 triglycerides 183 HDL 31 LDL 40 but LDL particle number was not obtained. Hemoglobin A1c was elevated at 6.8. In January when taken by Dr. Delsa Bern was 6.7. Between January and June he did have nutritional consultation with no improvement in his hemoglobin A1c. Review of the chart indicates that his HbA1c has been in the 6.5 range for almost 3 years.  When I saw him several months ago he was complaining of episodic palpitations. These were becoming worse and seem to be occurring every other day;  typically occur in the late afternoon or early morning. He's not had any change in caffeine intake. He denies associated chest pressure or presyncope. At that time I discontinued his amlodipine substituted this with Cardizem CD 120 mg. I had him wear a [employer] event  monitor. On monitor he was found to  have recurrent episodes of paroxysmal atrial fibrillation. As result,  he was started on Eliquis 5 mg bid and was advised to discontinue his aspirin. He states that he is now been asked still experiencing episodes of burst of probable atrial fibrillation every 3-4 days. When I saw him 2  months ago because of his complaints of still experiencing recurrent episodes I elected to initiate antiarrhythmic therapy with Rythmol 150 mg every 8 hours. At that time, he was in sinus rhythm with bradycardia at 55 and I recommended a dose reduction of his carvedilol from 50 twice a day down to initially 25 twice a day. Subsequently, on the reduced dose he did note some additional palpitations and his dose was adjusted to 37.5 mg in the morning and 25 mg at night.   I last saw him one month ago and at that time Martin Cordova was most likely in atrial fib/flutter rhythm with a ventricular rate in the low 100's. .  I further titrated his carvedilol back to 50 mg twice a day. He has felt improved on this regimen. He denies any awareness that his pulse is irregular or very fast. He also was hypertensive with a blood pressure of 144/102.   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 He has been on CPAP therapy  initially for approximately 8 years and has been on BiPAP auto SV for over 4 years. He admits to using his BiPAP with 100% compliance.  I last saw him every 07/08/2013 at which time I discontinued his diltiazem and further titrated his Rythmol to 225 mg every 8 hours. He also is on carvedilol and 50 mg twice a day. He feels his ventricular rate has improved. He has been on Eliquis  was 5 mg twice a day as anticoagulant for several months. He is unaware that his rhythm is abnormal presently.  Presently, he denies chest pain. He denies presyncope or syncope. He denies  abdominal pain. He denies change in urination. He denies tremors.  Past Medical History  Diagnosis Date  . GERD 12/07/2006  . HYPERGLYCEMIA 12/07/2006  . HYPERLIPIDEMIA 12/07/2006    mixed wth an atherogenic dyslipidemic pattern  . HYPERTENSION 12/07/2006  . OBESITY 11/10/2009  . THROMBOCYTOPENIA 12/07/2006  . TRANSAMINASES, SERUM, ELEVATED 12/07/2006  . SLEEP APNEA 10/22/2007    uses bipap  . 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 Claiborne Billings  . Diabetes mellitus without complication   . Renal insufficiency     Cr to 3 on ACE  . Nephrolithiasis   . Normal coronary arteries 04/06/2007    by cath EF 55%., last ECHO 10/13/10 EF>55% mild MR, last nuc 06/16/10 EF 57% low risk scan  . Pulmonary HTN     RV syst.  40-69mHg- moderate by echo  . Palpitations     tachy  . PAF (paroxysmal atrial fibrillation) 04/09/2013    Past Surgical History  Procedure Laterality Date  . Cholecystectomy    . Rotator cuff repair    . Cystoscopy/retrograde/ureteroscopy  08/19/2011    Procedure: CYSTOSCOPY/RETROGRADE/URETEROSCOPY;  Surgeon: MHanley Ben MD;  [facility]  Service: Urology;  Laterality: Right;  JJ STENT PLACEMENT   . Cardiac catheterization  04/06/2007    normal cors    Allergies  Allergen Reactions  . Lisinopril     Renal insufficiency    Current Outpatient Prescriptions  Medication Sig Dispense Refill  . apixaban (ELIQUIS) 5 MG TABS tablet Take 1 tablet (5 mg total) by mouth 2 (two) times daily.  28 tablet  0  . atorvastatin (LIPITOR) 10 MG tablet Take 10 mg by mouth daily.      . carvedilol (COREG) 25 MG tablet Take 2 tablets (50 mg total) by mouth 2 (two) times daily with a meal.  120 tablet  11  . cetirizine (ZYRTEC) 10 MG tablet Take 10 mg by mouth daily.      . Cholecalciferol (VITAMIN D3) 1000 UNITS CAPS Take 2,000 Units by mouth daily.       .Marland Kitchendiltiazem (CARDIZEM CD) 120 MG 24 hr capsule Take at night.  30 capsule  6  . furosemide (LASIX) 20 MG  tablet Take 20 mg by mouth daily as needed for edema. Swelling      . metFORMIN (GLUCOPHAGE-XR) 500 MG 24 hr tablet Take 500 mg by mouth 2 (two) times daily.      . Multiple Vitamin (MULTIVITAMIN) tablet Take 1 tablet by mouth daily.        . niacin (NIASPAN) 1000 MG CR tablet TAKE 1 TABLET BY MOUTH AT BEDTIME  30 tablet  6  . NON FORMULARY BIPAP therapy      . omeprazole (PRILOSEC) 20 MG capsule TAKE ONE CAPSULE BY MOUTH DAILY  30 capsule  5  . potassium citrate (UROCIT-K) 10 MEQ (  1080 MG) SR tablet Take 10 mEq by mouth 3 (three) times daily with meals.       . propafenone (RYTHMOL) 150 MG tablet Take 1 tablet (150 mg total) by mouth every 8 (eight) hours.  60 tablet  6   No current facility-administered medications for this visit.    Socially he is married has 4 children and 2 grandchildren. He is a distant relative to the "Slivinski brothers." He does try to walk and exercise. There is no tobacco use. He does drink alcohol to  ROS is notable for ankle swelling he denies fever chills night sweats. He does wear glasses. He denies skin rash. He denies difficulty in hearing. He is unaware lymphadenopathy. He denies wheezing. He denies shortness of breath or cough. His palpitations have improved. His chest pressure or chest pain. He has been using his BiPAP therapy with 100% compliance. He denies PND or orthopnea. He denies breakthrough snoring. He denies residual daytime sleepiness. He denies restless legs. He denies abdominal pain. He denies nausea vomiting. He denies urinary changes. He denies blood in his stool. He is unaware of anemia. His renal function has stabilized to a creatinine in the 1.5-1.6 range. He denies bleeding and is tolerating eliquis. No  indigestion or paresthesias. He denies claudication. He does have difficulty with erectile function. He denies recent peripheral edema, and this markedly improved with discontinuance of his amlodipine. Other comprehensive 14 point system review is  negative.   PE BP 140/100  Pulse 98  Ht 6' (1.829 m)  Wt 259 lb 4.8 oz (117.618 kg)  BMI 35.16 kg/m2  Repeat blood pressure by me was 122/70. Pulse was 86 Repeat blood pressure 130/90. General: Alert, oriented, no distress.  HEENT: Normocephalic, atraumatic. Pupils round and reactive; sclera anicteric; no xanthelasmas. Extraocular muscles are full. There is no lid lag. Nose without nasal septal hypertrophy Mouth/Parynx benign; Mallinpatti scale 3  Neck: Thick, no JVD, no carotid bruits; normal carotid upstroke Lungs: clear to ausculatation and percussion; no wheezing or rales Chest wall: No tenderness to palpation Heart: Ventricular rate seems regular heart rate in the mid-80's No ectopy, s1 s2 normal 1/6 sem; no ectopy heard on auscultation Abdomen: Moderate central adiposity ;soft, nontender; no hepatosplenomehaly, BS+; abdominal aorta nontender and not dilated by palpation. Back: No CVA tenderness Pulses 2+ Extremities: Resolution of prior bilateral ankle swelling, Homan's sign negative  Neurologic: grossly nonfocal; cranial nerves intact. Psychological: Normal affect and mood  ECG today (independently read by me): Atrial flutter with 2:1 block with a ventricular rate of 87 beats per minute. Atrial rate is approximately 360 ms. 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 fib flutter now with right bundle branch block and repolarization changes.  Prior ECG of 06/04/2013: EKG  suggests probable atrial flutter with a ventricular rate of 105 beats per minute. There also are frequent PVCs. QTc interval is 436 ms. They're nonspecific T changes.  LABS:  BMET    Component Value Date/Time   NA 144 04/30/2013 0923   K 4.6 04/30/2013 0923   CL 105 04/30/2013 0923   CO2 27 04/30/2013 0923   GLUCOSE 128* 04/30/2013 0923   BUN 21 04/30/2013 0923   CREATININE 1.58* 04/30/2013 0923   CREATININE 1.6* 04/12/2013 0825    CALCIUM 9.6 04/30/2013 0923   CALCIUM 9.7 07/03/2012 0942   GFRNONAA 46.00 02/02/2010 0808   GFRAA  Value: 45        The eGFR  has been calculated using the MDRD equation. This calculation has not been validated in all clinical situations. eGFR's persistently <60 mL/min signify possible Chronic Kidney Disease.* 09/25/2008 0352     Hepatic Function Panel     Component Value Date/Time   PROT 7.0 04/30/2013 0923   ALBUMIN 4.5 04/30/2013 0923   AST 28 04/30/2013 0923   ALT 45 04/30/2013 0923   ALKPHOS 43 04/30/2013 0923   BILITOT 0.9 04/30/2013 0923   BILIDIR 0.2 04/12/2013 0825   IBILI 1.1* 09/25/2008 0352     CBC    Component Value Date/Time   WBC 4.6 04/30/2013 0923   RBC 4.48 04/30/2013 0923   HGB 13.6 04/30/2013 0923   HCT 40.1 04/30/2013 0923   PLT 95* 04/30/2013 0923   MCV 89.5 04/30/2013 0923   MCH 30.4 04/30/2013 0923   MCHC 33.9 04/30/2013 0923   RDW 14.0 04/30/2013 0923   LYMPHSABS 0.9 07/03/2012 0942   MONOABS 0.4 07/03/2012 0942   EOSABS 0.1 07/03/2012 0942   BASOSABS 0.0 07/03/2012 0942     BNP No results found for this basename: probnp    Lipid Panel     Component Value Date/Time   CHOL 123 04/12/2013 0825   TRIG 229.0* 04/12/2013 0825   HDL 34.70* 04/12/2013 0825   CHOLHDL 4 04/12/2013 0825   VLDL 45.8* 04/12/2013 0825   LDLCALC 40 12/06/2012 0823     RADIOLOGY: No results found.    ASSESSMENT AND PLAN: Martin Cordova has documented normal coronary arteries by heart catheterization in 2008. He has a history of hypertension, mixed hyperlipidemia, and has paroxysmal atrial fibrillation/flutter. Since I last saw him on the increased Rythmol at 225 mg every 8 hours as well as his carvedilol 50 mg twice a day ventricular rate is now well controlled. He is unaware of his cardiac arrhythmia. He has been on anticoagulation for several months. He is now off diltiazem. He continues to use his BiPAP well S/P wrist severe obstructive sleep apnea and is fully aware of the  importance of continuing this with reference to his atrial arrhythmia. I had al ong discussion with both he and his wife and we'll now schedule him for outpatient DC cardioversion. I will do this next week at the hospital. I discussed the risks benefits of this procedure he will return later in the week for blood work to be done within 7 days of his scheduled outpatient cardioversion      Troy Sine, MD, [facility]  07/08/2013 9:18 AM

## 2013-07-30 ENCOUNTER — Telehealth: Payer: Self-pay | Admitting: *Deleted

## 2013-07-30 ENCOUNTER — Other Ambulatory Visit: Payer: Self-pay | Admitting: *Deleted

## 2013-07-30 DIAGNOSIS — Z01818 Encounter for other preprocedural examination: Secondary | ICD-10-CM

## 2013-07-30 NOTE — Telephone Encounter (Signed)
Entered cardioversion orders into computer for March 2 procedure.

## 2013-07-31 ENCOUNTER — Other Ambulatory Visit: Payer: Self-pay | Admitting: Cardiology

## 2013-07-31 ENCOUNTER — Ambulatory Visit
Admission: RE | Admit: 2013-07-31 | Discharge: 2013-07-31 | Disposition: A | Discharge status: Home / Self Care | Payer: Medicare Other | Source: Ambulatory Visit | Attending: Cardiology | Admitting: Cardiology

## 2013-07-31 DIAGNOSIS — Z01812 Encounter for preprocedural laboratory examination: Secondary | ICD-10-CM

## 2013-07-31 LAB — COMPREHENSIVE METABOLIC PANEL
ALT: 34 U/L (ref 0–53)
AST: 24 U/L (ref 0–37)
Albumin: 4.2 g/dL (ref 3.5–5.2)
Alkaline Phosphatase: 37 U/L — ABNORMAL LOW (ref 39–117)
BUN: 25 mg/dL — ABNORMAL HIGH (ref 6–23)
CO2: 27 mEq/L (ref 19–32)
Calcium: 9.4 mg/dL (ref 8.4–10.5)
Chloride: 105 mEq/L (ref 96–112)
Creat: 1.82 mg/dL — ABNORMAL HIGH (ref 0.50–1.35)
Glucose, Bld: 157 mg/dL — ABNORMAL HIGH (ref 70–99)
Potassium: 4.8 mEq/L (ref 3.5–5.3)
Sodium: 140 mEq/L (ref 135–145)
Total Bilirubin: 0.9 mg/dL (ref 0.2–1.2)
Total Protein: 6.8 g/dL (ref 6.0–8.3)

## 2013-07-31 LAB — CBC
HCT: 42 % (ref 39.0–52.0)
Hemoglobin: 14.4 g/dL (ref 13.0–17.0)
MCH: 30.8 pg (ref 26.0–34.0)
MCHC: 34.3 g/dL (ref 30.0–36.0)
MCV: 89.9 fL (ref 78.0–100.0)
Platelets: 110 10*3/uL — ABNORMAL LOW (ref 150–400)
RBC: 4.67 MIL/uL (ref 4.22–5.81)
RDW: 15 % (ref 11.5–15.5)
WBC: 4.7 10*3/uL (ref 4.0–10.5)

## 2013-08-05 ENCOUNTER — Encounter (HOSPITAL_COMMUNITY)
Admission: RE | Disposition: A | Discharge status: Home / Self Care | Payer: Medicare Other | Source: Ambulatory Visit | Attending: Cardiovascular Disease

## 2013-08-05 ENCOUNTER — Encounter (HOSPITAL_COMMUNITY): Payer: Medicare Other | Admitting: Anesthesiology

## 2013-08-05 ENCOUNTER — Encounter (HOSPITAL_COMMUNITY): Payer: Self-pay | Admitting: *Deleted

## 2013-08-05 ENCOUNTER — Ambulatory Visit (HOSPITAL_COMMUNITY)
Admission: RE | Admit: 2013-08-05 | Discharge: 2013-08-05 | Disposition: A | Discharge status: Home / Self Care | Payer: Medicare Other | Source: Ambulatory Visit | Attending: Cardiovascular Disease | Admitting: Cardiovascular Disease

## 2013-08-05 ENCOUNTER — Ambulatory Visit (HOSPITAL_COMMUNITY): Payer: Medicare Other | Admitting: Anesthesiology

## 2013-08-05 DIAGNOSIS — I472 Ventricular tachycardia, unspecified: Secondary | ICD-10-CM | POA: Insufficient documentation

## 2013-08-05 DIAGNOSIS — K219 Gastro-esophageal reflux disease without esophagitis: Secondary | ICD-10-CM | POA: Insufficient documentation

## 2013-08-05 DIAGNOSIS — Z87442 Personal history of urinary calculi: Secondary | ICD-10-CM | POA: Insufficient documentation

## 2013-08-05 DIAGNOSIS — Z6835 Body mass index (BMI) 35.0-35.9, adult: Secondary | ICD-10-CM | POA: Insufficient documentation

## 2013-08-05 DIAGNOSIS — I4891 Unspecified atrial fibrillation: Secondary | ICD-10-CM | POA: Insufficient documentation

## 2013-08-05 DIAGNOSIS — I4729 Other ventricular tachycardia: Secondary | ICD-10-CM | POA: Insufficient documentation

## 2013-08-05 DIAGNOSIS — Z01818 Encounter for other preprocedural examination: Secondary | ICD-10-CM

## 2013-08-05 DIAGNOSIS — N289 Disorder of kidney and ureter, unspecified: Secondary | ICD-10-CM | POA: Insufficient documentation

## 2013-08-05 DIAGNOSIS — Z7901 Long term (current) use of anticoagulants: Secondary | ICD-10-CM | POA: Insufficient documentation

## 2013-08-05 DIAGNOSIS — E119 Type 2 diabetes mellitus without complications: Secondary | ICD-10-CM | POA: Insufficient documentation

## 2013-08-05 DIAGNOSIS — E669 Obesity, unspecified: Secondary | ICD-10-CM | POA: Insufficient documentation

## 2013-08-05 DIAGNOSIS — I2789 Other specified pulmonary heart diseases: Secondary | ICD-10-CM | POA: Insufficient documentation

## 2013-08-05 DIAGNOSIS — R609 Edema, unspecified: Secondary | ICD-10-CM | POA: Insufficient documentation

## 2013-08-05 DIAGNOSIS — I4892 Unspecified atrial flutter: Secondary | ICD-10-CM | POA: Insufficient documentation

## 2013-08-05 DIAGNOSIS — E8881 Metabolic syndrome: Secondary | ICD-10-CM | POA: Insufficient documentation

## 2013-08-05 DIAGNOSIS — G4733 Obstructive sleep apnea (adult) (pediatric): Secondary | ICD-10-CM | POA: Insufficient documentation

## 2013-08-05 DIAGNOSIS — I10 Essential (primary) hypertension: Secondary | ICD-10-CM | POA: Insufficient documentation

## 2013-08-05 DIAGNOSIS — E782 Mixed hyperlipidemia: Secondary | ICD-10-CM | POA: Insufficient documentation

## 2013-08-05 HISTORY — PX: CARDIOVERSION: SHX1299

## 2013-08-05 LAB — GLUCOSE, CAPILLARY
Glucose-Capillary: 133 mg/dL — ABNORMAL HIGH (ref 70–99)
Glucose-Capillary: 133 mg/dL — ABNORMAL HIGH (ref 70–99)

## 2013-08-05 LAB — BASIC METABOLIC PANEL
BUN: 28 mg/dL — ABNORMAL HIGH (ref 6–23)
CO2: 24 mEq/L (ref 19–32)
Calcium: 9.7 mg/dL (ref 8.4–10.5)
Chloride: 107 mEq/L (ref 96–112)
Creatinine, Ser: 1.74 mg/dL — ABNORMAL HIGH (ref 0.50–1.35)
GFR calc Af Amer: 44 mL/min — ABNORMAL LOW (ref >90)
GFR calc non Af Amer: 38 mL/min — ABNORMAL LOW (ref >90)
Glucose, Bld: 142 mg/dL — ABNORMAL HIGH (ref 70–99)
Potassium: 5 mEq/L (ref 3.7–5.3)
Sodium: 144 mEq/L (ref 137–147)

## 2013-08-05 SURGERY — CARDIOVERSION
Anesthesia: General

## 2013-08-05 MED ORDER — PROPOFOL 10 MG/ML IV BOLUS
INTRAVENOUS | Status: DC | PRN
  Administered 2013-08-05: 100 mg via INTRAVENOUS

## 2013-08-05 MED ORDER — PROPOFOL 10 MG/ML IV BOLUS
INTRAVENOUS | Status: AC
  Filled 2013-08-05: qty 20

## 2013-08-05 MED ORDER — SODIUM CHLORIDE 0.9 % IV SOLN
INTRAVENOUS | Status: DC
  Administered 2013-08-05: 500 mL via INTRAVENOUS

## 2013-08-05 MED ORDER — LIDOCAINE HCL (CARDIAC) 20 MG/ML IV SOLN
INTRAVENOUS | Status: AC
  Filled 2013-08-05: qty 5

## 2013-08-05 MED ORDER — SODIUM CHLORIDE 0.9 % IV SOLN
INTRAVENOUS | Status: DC | PRN
  Administered 2013-08-05: 12:00:00 via INTRAVENOUS

## 2013-08-05 NOTE — Anesthesia Postprocedure Evaluation (Signed)
  Anesthesia Post-op Note  Patient: Martin Cordova  Procedure(s) Performed: Procedure(s): CARDIOVERSION (N/A)  Patient Location: PACU and Endoscopy Unit  Anesthesia Type:General  Level of Consciousness: awake, alert  and oriented  Airway and Oxygen Therapy: Patient Spontanous Breathing  Post-op Pain: none  Post-op Assessment: Post-op Vital signs reviewed  Post-op Vital Signs: Reviewed and stable  Complications: No apparent anesthesia complications

## 2013-08-05 NOTE — Anesthesia Preprocedure Evaluation (Signed)
Anesthesia Evaluation  Patient identified by MRN, date of birth, ID band Patient awake    Reviewed: Allergy & Precautions, H&P , NPO status , Patient's Chart, lab work & pertinent test results, reviewed documented beta blocker date and time   Airway Mallampati: II TM Distance: >3 FB Neck ROM: full    Dental   Pulmonary sleep apnea , former smoker,  breath sounds clear to auscultation        Cardiovascular hypertension, On Medications and On Home Beta Blockers Atrial Fibrillation Rhythm:regular     Neuro/Psych negative neurological ROS  negative psych ROS   GI/Hepatic Neg liver ROS, GERD-  Medicated and Controlled,  Endo/Other  diabetes, Oral Hypoglycemic Agents  Renal/GU Renal InsufficiencyRenal disease  negative genitourinary   Musculoskeletal   Abdominal   Peds  Hematology negative hematology ROS (+)   Anesthesia Other Findings See surgeon's H&P   Reproductive/Obstetrics negative OB ROS                           Anesthesia Physical Anesthesia Plan  ASA: III  Anesthesia Plan: General   Post-op Pain Management:    Induction: Intravenous  Airway Management Planned: Mask  Additional Equipment:   Intra-op Plan:   Post-operative Plan:   Informed Consent: I have reviewed the patients History and Physical, chart, labs and discussed the procedure including the risks, benefits and alternatives for the proposed anesthesia with the patient or authorized representative who has indicated his/her understanding and acceptance.   Dental Advisory Given  Plan Discussed with: CRNA and Surgeon  Anesthesia Plan Comments:         Anesthesia Quick Evaluation

## 2013-08-05 NOTE — Transfer of Care (Signed)
2Immediate Anesthesia Transfer of Care Note  Patient: Martin Cordova  Procedure(s) Performed: Procedure(s): CARDIOVERSION (N/A)  Patient Location: PACU and Endoscopy Unit  Anesthesia Type:General  Level of Consciousness: awake, alert  and oriented  Airway & Oxygen Therapy: Patient Spontanous Breathing and Patient connected to nasal cannula oxygen  Post-op Assessment: Report given to PACU RN and Post -op Vital signs reviewed and stable  Post vital signs: Reviewed and stable  Complications: No apparent anesthesia complications

## 2013-08-05 NOTE — CV Procedure (Signed)
[facility]  CARDIOVERSION NOTE   Procedure: Electrical Cardioversion Indications:  Atrial Flutter  Procedure Details:  Consent: Risks of procedure as well as the alternatives and risks of each were explained to the (patient/caregiver).  Consent for procedure obtained.  Time Out: Verified patient identification, verified procedure, site/side was marked, verified correct patient position, special equipment/implants available, medications/allergies/relevent history reviewed, required imaging and test results available.  Performed  Patient placed on cardiac monitor, pulse oximetry, supplemental oxygen as necessary.  Sedation given: propofol 100 mg Pacer pads placed anterior and posterior chest.  Cardioverted 1 time(s).  Cardioverted at 100 joules  Evaluation: Findings: Post procedure EKG shows: NSR Complications: None Patient did tolerate procedure well.   Successful DC cardioversion with restoration of sinus rhythm.    Troy Sine, MD, [facility] 08/05/2013 12:41 PM

## 2013-08-05 NOTE — Interval H&P Note (Signed)
History and Physical Interval Note:  08/05/2013 12:30 PM  Martin Cordova  has presented today for surgery, with the diagnosis of atrial flutter.  The various methods of treatment have been discussed with the patient and family. After consideration of risks, benefits and other options for treatment, the patient has consented to  Procedure(s): CARDIOVERSION (N/A) as a surgical intervention .  The patient's history has been reviewed, patient examined, no change in status, stable for surgery.  I have reviewed the patient's chart and labs.  Questions were answered to the patient's satisfaction.     KELLY,THOMAS A

## 2013-08-05 NOTE — Preoperative (Signed)
Beta Blockers   Reason not to administer Beta Blockers:Not Applicable 

## 2013-08-05 NOTE — H&P (View-Only) (Signed)
Patient ID: Martin Cordova, male   DOB: 08/19/1940, 72 y.o.   MRN: [id number]        HPI: Martin Cordova is a 72 y.o. male is who presents to the office today for cardiology followup evaluation of his A Flutter.  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 In the past, he developed an obstructive uropathy attributed to kidney stones; creatinine had risen up to 3 and ultimately improved and stabilized at approximately 1.5. Earlier this year with his blood pressure being elevated I added amlodipine 5 mg to his medical regimen.  Martin Cordova uses his BiPAP daily and does note good sleep. These does complain of ankle swelling. When I l saw him in June I suggested he increase his furosemide and take 20 mg every other day to help control some of his peripheral edema. He had been previously taken off his lisinopril when his creatinine had risen in the setting of his obstructive uropathy. He underwent laboratory in July which showed a total cholesterol 108 triglycerides 183 HDL 31 LDL 40 but LDL particle number was not obtained. Hemoglobin A1c was elevated at 6.8. In January when taken by Dr. Seward was 6.7. Between January and June he did have nutritional consultation with no improvement in his hemoglobin A1c. Review of the chart indicates that his HbA1c has been in the 6.5 range for almost 3 years.  When I saw him several months ago he was complaining of episodic palpitations. These were becoming worse and seem to be occurring every other day;  typically occur in the late afternoon or early morning. He's not had any change in caffeine intake. He denies associated chest pressure or presyncope. At that time I discontinued his amlodipine substituted this with Cardizem CD 120 mg. I had him wear a CardioNet event  monitor. On monitor he was found to  have recurrent episodes of paroxysmal atrial fibrillation. As result,  he was started on Eliquis 5 mg bid and was advised to discontinue his aspirin. He states that he is now been asked still experiencing episodes of burst of probable atrial fibrillation every 3-4 days. When I saw him 2  months ago because of his complaints of still experiencing recurrent episodes I elected to initiate antiarrhythmic therapy with Rythmol 150 mg every 8 hours. At that time, he was in sinus rhythm with bradycardia at 55 and I recommended a dose reduction of his carvedilol from 50 twice a day down to initially 25 twice a day. Subsequently, on the reduced dose he did note some additional palpitations and his dose was adjusted to 37.5 mg in the morning and 25 mg at night.   I last saw him one month ago and at that time Martin Cordova was most likely in atrial fib/flutter rhythm with a ventricular rate in the low 100's. .  I further titrated his carvedilol back to 50 mg twice a day. He has felt improved on this regimen. He denies any awareness that his pulse is irregular or very fast. He also was hypertensive with a blood pressure of 144/102.   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 He has been on CPAP therapy   initially for approximately 8 years and has been on BiPAP auto SV for over 4 years. He admits to using his BiPAP with 100% compliance.  I last saw him every 07/08/2013 at which time I discontinued his diltiazem and further titrated his Rythmol to 225 mg every 8 hours. He also is on carvedilol and 50 mg twice a day. He feels his ventricular rate has improved. He has been on Eliquis  was 5 mg twice a day as anticoagulant for several months. He is unaware that his rhythm is abnormal presently.  Presently, he denies chest pain. He denies presyncope or syncope. He denies  abdominal pain. He denies change in urination. He denies tremors.  Past Medical History  Diagnosis Date  . GERD 12/07/2006  . HYPERGLYCEMIA 12/07/2006  . HYPERLIPIDEMIA 12/07/2006    mixed wth an atherogenic dyslipidemic pattern  . HYPERTENSION 12/07/2006  . OBESITY 11/10/2009  . THROMBOCYTOPENIA 12/07/2006  . TRANSAMINASES, SERUM, ELEVATED 12/07/2006  . SLEEP APNEA 10/22/2007    uses bipap  . 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 kelly  . Diabetes mellitus without complication   . Renal insufficiency     Cr to 3 on ACE  . Nephrolithiasis   . Normal coronary arteries 04/06/2007    by cath EF 55%., last ECHO 10/13/10 EF>55% mild MR, last nuc 06/16/10 EF 57% low risk scan  . Pulmonary HTN     RV syst.  40-50mmHg- moderate by echo  . Palpitations     tachy  . PAF (paroxysmal atrial fibrillation) 04/09/2013    Past Surgical History  Procedure Laterality Date  . Cholecystectomy    . Rotator cuff repair    . Cystoscopy/retrograde/ureteroscopy  08/19/2011    Procedure: CYSTOSCOPY/RETROGRADE/URETEROSCOPY;  Surgeon: Marc-Henry Nesi, MD;  Location: [facility];  Service: Urology;  Laterality: Right;  JJ STENT PLACEMENT   . Cardiac catheterization  04/06/2007    normal cors    Allergies  Allergen Reactions  . Lisinopril     Renal insufficiency    Current Outpatient Prescriptions  Medication Sig Dispense Refill  . apixaban (ELIQUIS) 5 MG TABS tablet Take 1 tablet (5 mg total) by mouth 2 (two) times daily.  28 tablet  0  . atorvastatin (LIPITOR) 10 MG tablet Take 10 mg by mouth daily.      . carvedilol (COREG) 25 MG tablet Take 2 tablets (50 mg total) by mouth 2 (two) times daily with a meal.  120 tablet  11  . cetirizine (ZYRTEC) 10 MG tablet Take 10 mg by mouth daily.      . Cholecalciferol (VITAMIN D3) 1000 UNITS CAPS Take 2,000 Units by mouth daily.       . diltiazem (CARDIZEM CD) 120 MG 24 hr capsule Take at night.  30 capsule  6  . furosemide (LASIX) 20 MG  tablet Take 20 mg by mouth daily as needed for edema. Swelling      . metFORMIN (GLUCOPHAGE-XR) 500 MG 24 hr tablet Take 500 mg by mouth 2 (two) times daily.      . Multiple Vitamin (MULTIVITAMIN) tablet Take 1 tablet by mouth daily.        . niacin (NIASPAN) 1000 MG CR tablet TAKE 1 TABLET BY MOUTH AT BEDTIME  30 tablet  6  . NON FORMULARY BIPAP therapy      . omeprazole (PRILOSEC) 20 MG capsule TAKE ONE CAPSULE BY MOUTH DAILY  30 capsule  5  . potassium citrate (UROCIT-K) 10 MEQ (  1080 MG) SR tablet Take 10 mEq by mouth 3 (three) times daily with meals.       . propafenone (RYTHMOL) 150 MG tablet Take 1 tablet (150 mg total) by mouth every 8 (eight) hours.  60 tablet  6   No current facility-administered medications for this visit.    Socially he is married has 4 children and 2 grandchildren. He is a distant relative to the "Weismann brothers." He does try to walk and exercise. There is no tobacco use. He does drink alcohol to  ROS is notable for ankle swelling he denies fever chills night sweats. He does wear glasses. He denies skin rash. He denies difficulty in hearing. He is unaware lymphadenopathy. He denies wheezing. He denies shortness of breath or cough. His palpitations have improved. His chest pressure or chest pain. He has been using his BiPAP therapy with 100% compliance. He denies PND or orthopnea. He denies breakthrough snoring. He denies residual daytime sleepiness. He denies restless legs. He denies abdominal pain. He denies nausea vomiting. He denies urinary changes. He denies blood in his stool. He is unaware of anemia. His renal function has stabilized to a creatinine in the 1.5-1.6 range. He denies bleeding and is tolerating eliquis. No  indigestion or paresthesias. He denies claudication. He does have difficulty with erectile function. He denies recent peripheral edema, and this markedly improved with discontinuance of his amlodipine. Other comprehensive 14 point system review is  negative.   PE BP 140/100  Pulse 98  Ht 6' (1.829 m)  Wt 259 lb 4.8 oz (117.618 kg)  BMI 35.16 kg/m2  Repeat blood pressure by me was 122/70. Pulse was 86 Repeat blood pressure 130/90. General: Alert, oriented, no distress.  HEENT: Normocephalic, atraumatic. Pupils round and reactive; sclera anicteric; no xanthelasmas. Extraocular muscles are full. There is no lid lag. Nose without nasal septal hypertrophy Mouth/Parynx benign; Mallinpatti scale 3  Neck: Thick, no JVD, no carotid bruits; normal carotid upstroke Lungs: clear to ausculatation and percussion; no wheezing or rales Chest wall: No tenderness to palpation Heart: Ventricular rate seems regular heart rate in the mid-80's No ectopy, s1 s2 normal 1/6 sem; no ectopy heard on auscultation Abdomen: Moderate central adiposity ;soft, nontender; no hepatosplenomehaly, BS+; abdominal aorta nontender and not dilated by palpation. Back: No CVA tenderness Pulses 2+ Extremities: Resolution of prior bilateral ankle swelling, Homan's sign negative  Neurologic: grossly nonfocal; cranial nerves intact. Psychological: Normal affect and mood  ECG today (independently read by me): Atrial flutter with 2:1 block with a ventricular rate of 87 beats per minute. Atrial rate is approximately 360 ms. 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 fib flutter now with right bundle branch block and repolarization changes.  Prior ECG of 06/04/2013: EKG  suggests probable atrial flutter with a ventricular rate of 105 beats per minute. There also are frequent PVCs. QTc interval is 436 ms. They're nonspecific T changes.  LABS:  BMET    Component Value Date/Time   NA 144 04/30/2013 0923   K 4.6 04/30/2013 0923   CL 105 04/30/2013 0923   CO2 27 04/30/2013 0923   GLUCOSE 128* 04/30/2013 0923   BUN 21 04/30/2013 0923   CREATININE 1.58* 04/30/2013 0923   CREATININE 1.6* 04/12/2013 0825    CALCIUM 9.6 04/30/2013 0923   CALCIUM 9.7 07/03/2012 0942   GFRNONAA 46.00 02/02/2010 0808   GFRAA  Value: 45        The eGFR   has been calculated using the MDRD equation. This calculation has not been validated in all clinical situations. eGFR's persistently <60 mL/min signify possible Chronic Kidney Disease.* 09/25/2008 0352     Hepatic Function Panel     Component Value Date/Time   PROT 7.0 04/30/2013 0923   ALBUMIN 4.5 04/30/2013 0923   AST 28 04/30/2013 0923   ALT 45 04/30/2013 0923   ALKPHOS 43 04/30/2013 0923   BILITOT 0.9 04/30/2013 0923   BILIDIR 0.2 04/12/2013 0825   IBILI 1.1* 09/25/2008 0352     CBC    Component Value Date/Time   WBC 4.6 04/30/2013 0923   RBC 4.48 04/30/2013 0923   HGB 13.6 04/30/2013 0923   HCT 40.1 04/30/2013 0923   PLT 95* 04/30/2013 0923   MCV 89.5 04/30/2013 0923   MCH 30.4 04/30/2013 0923   MCHC 33.9 04/30/2013 0923   RDW 14.0 04/30/2013 0923   LYMPHSABS 0.9 07/03/2012 0942   MONOABS 0.4 07/03/2012 0942   EOSABS 0.1 07/03/2012 0942   BASOSABS 0.0 07/03/2012 0942     BNP No results found for this basename: probnp    Lipid Panel     Component Value Date/Time   CHOL 123 04/12/2013 0825   TRIG 229.0* 04/12/2013 0825   HDL 34.70* 04/12/2013 0825   CHOLHDL 4 04/12/2013 0825   VLDL 45.8* 04/12/2013 0825   LDLCALC 40 12/06/2012 0823     RADIOLOGY: No results found.    ASSESSMENT AND PLAN: Martin Cordova has documented normal coronary arteries by heart catheterization in 2008. He has a history of hypertension, mixed hyperlipidemia, and has paroxysmal atrial fibrillation/flutter. Since I last saw him on the increased Rythmol at 225 mg every 8 hours as well as his carvedilol 50 mg twice a day ventricular rate is now well controlled. He is unaware of his cardiac arrhythmia. He has been on anticoagulation for several months. He is now off diltiazem. He continues to use his BiPAP well S/P wrist severe obstructive sleep apnea and is fully aware of the  importance of continuing this with reference to his atrial arrhythmia. I had al ong discussion with both he and his wife and we'll now schedule him for outpatient DC cardioversion. I will do this next week at the hospital. I discussed the risks benefits of this procedure he will return later in the week for blood work to be done within 7 days of his scheduled outpatient cardioversion      Thomas A. Kelly, MD, FACC  07/08/2013 9:18 AM    

## 2013-08-06 ENCOUNTER — Encounter (HOSPITAL_COMMUNITY): Payer: Self-pay | Admitting: Cardiovascular Disease

## 2013-08-06 ENCOUNTER — Telehealth: Payer: Self-pay | Admitting: *Deleted

## 2013-08-06 NOTE — Telephone Encounter (Signed)
Faxed signed CPAP supply order with 07/29/13 office note back to [facility].

## 2013-08-21 ENCOUNTER — Ambulatory Visit (INDEPENDENT_AMBULATORY_CARE_PROVIDER_SITE_OTHER): Payer: Medicare Other | Admitting: Physician Assistant

## 2013-08-21 ENCOUNTER — Encounter: Payer: Self-pay | Admitting: Physician Assistant

## 2013-08-21 VITALS — BP 130/80 | HR 77 | Ht 72.0 in | Wt 262.1 lb

## 2013-08-21 DIAGNOSIS — E669 Obesity, unspecified: Secondary | ICD-10-CM

## 2013-08-21 DIAGNOSIS — I10 Essential (primary) hypertension: Secondary | ICD-10-CM

## 2013-08-21 DIAGNOSIS — I4891 Unspecified atrial fibrillation: Secondary | ICD-10-CM

## 2013-08-21 DIAGNOSIS — I480 Paroxysmal atrial fibrillation: Secondary | ICD-10-CM

## 2013-08-21 DIAGNOSIS — E785 Hyperlipidemia, unspecified: Secondary | ICD-10-CM

## 2013-08-21 NOTE — Patient Instructions (Signed)
1.  Followup with Dr. Claiborne Billings in 3 months.

## 2013-08-21 NOTE — Assessment & Plan Note (Signed)
Blood pressure controlled at this time. No changes current

## 2013-08-21 NOTE — Progress Notes (Signed)
Date:  08/21/2013   ID:  Martin Cordova, DOB 08/16/1940, MRN [id number]  PCP:  Chancy Hurter, MD  Primary Cardiologist:  Claiborne Billings    History of Present Illness: Martin Cordova is a 73 y.o. 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 In the past, he developed an obstructive uropathy attributed to kidney stones; creatinine had risen up to 3 and ultimately improved and stabilized at approximately 1.5. Earlier this year with his blood pressure being elevated I added amlodipine 5 mg to his medical regimen. Martin Cordova uses his BiPAP daily and does note good sleep. These does complain of ankle swelling. When Dr. Claiborne Billings saw him in June he suggested he increase his furosemide and take 20 mg every other day to help control some of his peripheral edema. He had been previously taken off his lisinopril when his creatinine had risen in the setting of his obstructive uropathy. He underwent laboratory in July which showed a total cholesterol 108 triglycerides 183 HDL 31 LDL 40 but LDL particle number was not obtained. Hemoglobin A1c was elevated at 6.8. In January when taken by Dr. Delsa Bern was 6.7. Between January and June he did have nutritional consultation with no improvement in his hemoglobin A1c. Review of the chart indicates that his HbA1c has been in the 6.5 range for almost 3 years.   When Dr. Claiborne Billings saw him several months ago he was complaining of episodic palpitations. These were becoming worse and seem to be occurring every other day; typically occur in the late afternoon or early morning. He's not had any change in caffeine intake. He denies associated chest pressure or presyncope. At that time I discontinued his amlodipine substituted this with Cardizem CD 120 mg. I had him wear a CardioNet event  monitor. On monitor he was found to have recurrent episodes of paroxysmal atrial fibrillation. As result, he was started on Eliquis 5 mg bid and was advised to discontinue his aspirin. He states that he is now been asked still experiencing episodes of burst of probable atrial fibrillation every 3-4 days. When I saw him 2 months ago because of his complaints of still experiencing recurrent episodes I elected to initiate antiarrhythmic therapy with Rythmol 150 mg every 8 hours. At that time, he was in sinus rhythm with bradycardia at 55 and I recommended a dose reduction of his carvedilol from 50 twice a day down to initially 25 twice a day. Subsequently, on the reduced dose he did note some additional palpitations and his dose was adjusted to 37.5 mg in the morning and 25 mg at night.  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 He has been on CPAP therapy initially for approximately 8 years and has been on BiPAP auto SV for over 4 years. He admits to using his BiPAP with 100% compliance.  Dr. Claiborne Billings discontinued his diltiazem and further titrated his Rythmol to 225 mg every 8 hours. He also is on carvedilol and 50 mg twice a day.   Patient underwent DC cardioversion on 08/05/2013. He presents today for follow up evaluation. His EKG shows that he is maintaining sinus rhythm at a rate of 77 beats per  minute. Does have occasional PVCs. He states he feels much better does have some exertional dyspnea but that quickly resolves after he slows down a bit.  Does have a little lower extremity edema.  Otherwise denies nausea, vomiting, fever, chest pain, shortness of breath, orthopnea, PND, cough, congestion, abdominal pain, hematochezia, melena,  Claudication.  Reports he has seen a dietitian in the past to help with obesity however he says they told him that he should be in a certain amount of  carbohydrate every day at every meal. He has not followed up since.   Wt Readings from Last 3 Encounters:  08/21/13 262 lb 1.6 oz (118.888 kg)  08/05/13 260 lb (117.935 kg)  08/05/13 260 lb (117.935 kg)     Past Medical History  Diagnosis Date  . GERD 12/07/2006  . HYPERGLYCEMIA 12/07/2006  . HYPERLIPIDEMIA 12/07/2006    mixed wth an atherogenic dyslipidemic pattern  . HYPERTENSION 12/07/2006  . OBESITY 11/10/2009  . THROMBOCYTOPENIA 12/07/2006  . TRANSAMINASES, SERUM, ELEVATED 12/07/2006  . SLEEP APNEA 10/22/2007    uses bipap  . 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 Claiborne Billings  . Diabetes mellitus without complication   . Renal insufficiency     Cr to 3 on ACE  . Nephrolithiasis   . Normal coronary arteries 04/06/2007    by cath EF 55%., last ECHO 10/13/10 EF>55% mild MR, last nuc 06/16/10 EF 57% low risk scan  . Pulmonary HTN     RV syst.  40-74mmHg- moderate by echo  . Palpitations     tachy  . PAF (paroxysmal atrial fibrillation) 04/09/2013    Current Outpatient Prescriptions  Medication Sig Dispense Refill  . apixaban (ELIQUIS) 5 MG TABS tablet Take 1 tablet (5 mg total) by mouth 2 (two) times daily.  28 tablet  0  . atorvastatin (LIPITOR) 10 MG tablet TAKE 1 TABLET DAILY AT BEDTIME.  30 tablet  5  . carvedilol (COREG) 25 MG tablet Take 2 tablets (50 mg total) by mouth 2 (two) times daily with a meal.  120 tablet  11  . cetirizine (ZYRTEC) 10 MG tablet Take 10 mg by mouth daily.      . Cholecalciferol (VITAMIN D3) 1000 UNITS CAPS Take 2,000 Units by mouth daily.       . diphenhydrAMINE (BENADRYL) 25 MG tablet Take 25 mg by mouth at bedtime as needed.      . furosemide (LASIX) 20 MG tablet Take 20 mg by mouth daily as needed for edema. Swelling      . metFORMIN (GLUCOPHAGE-XR) 500 MG 24 hr tablet Take 500 mg by mouth 2 (two) times daily.      . Multiple Vitamin (MULTIVITAMIN) tablet Take 1 tablet by mouth daily.        . niacin (NIASPAN) 1000 MG CR tablet TAKE 1 TABLET BY  MOUTH AT BEDTIME  30 tablet  5  . NON FORMULARY BIPAP therapy      . omeprazole (PRILOSEC) 20 MG capsule TAKE ONE CAPSULE BY MOUTH DAILY  30 capsule  5  . potassium citrate (UROCIT-K) 10 MEQ (1080 MG) SR tablet Take 10 mEq by mouth 3 (three) times daily with meals.       . propafenone (RYTHMOL) 225 MG tablet Take 1 tablet (225 mg total) by mouth every 8 (eight) hours.  90 tablet  5   No current facility-administered medications for this visit.    Allergies:    Allergies  Allergen Reactions  . Lisinopril  Renal insufficiency    Social History:  The patient  reports that he quit smoking about 33 years ago. He quit smokeless tobacco use about 9 years ago. His smokeless tobacco use included Chew. He reports that he does not drink alcohol or use illicit drugs.   Family history:   Family History  Problem Relation Age of Onset  . Diabetes Mother   . Heart failure Mother   . Stroke Mother   . Kidney failure Father   . Diabetes Father     ROS:  Please see the history of present illness.  All other systems reviewed and negative.   PHYSICAL EXAM: VS:  BP 130/80  Pulse 77  Ht 6' (1.829 m)  Wt 262 lb 1.6 oz (118.888 kg)  BMI 35.54 kg/m2 Obese, well developed, in no acute distress HEENT: Pupils are equal round react to light accommodation extraocular movements are intact.  Cardiac: Regular rate and rhythm without murmurs rubs or gallops. Lungs:  clear to auscultation bilaterally, no wheezing, rhonchi or rales Ext: One plus lower extremity edema.  2+ radial and dorsalis pedis pulses. Skin: warm and dry Neuro:  Grossly normal  EKG:  Sinus rhythm rate 77 beats per minute. PVCs noted.  ASSESSMENT AND PLAN:  Problem List Items Addressed This Visit   HYPERLIPIDEMIA     Currently treated with Lipitor and niacin    OBESITY     I offered a referral to a dietician.  It sounds as though he like he will think about it.    HYPERTENSION     Blood pressure controlled at this time. No  changes current    PAF (paroxysmal atrial fibrillation)     Patient is currently maintaining normal sinus rhythm with frequent PVCs. His rate is 77 beats per minute.  He continues on Rythmol 225 mg every 8 hours. Is also on eliquis.     Other Visit Diagnoses   A-fib    -  Primary    Relevant Orders       EKG 12-Lead

## 2013-08-21 NOTE — Assessment & Plan Note (Signed)
I offered a referral to a dietician.  It sounds as though he like he will think about it.

## 2013-08-21 NOTE — Assessment & Plan Note (Signed)
Currently treated with Lipitor and niacin

## 2013-08-21 NOTE — Assessment & Plan Note (Signed)
Patient is currently maintaining normal sinus rhythm with frequent PVCs. His rate is 77 beats per minute.  He continues on Rythmol 225 mg every 8 hours. Is also on eliquis.

## 2013-09-12 ENCOUNTER — Encounter: Payer: Self-pay | Admitting: Gastroenterology

## 2013-10-10 ENCOUNTER — Ambulatory Visit (INDEPENDENT_AMBULATORY_CARE_PROVIDER_SITE_OTHER): Payer: Medicare Other | Admitting: Internal Medicine

## 2013-10-10 ENCOUNTER — Encounter: Payer: Self-pay | Admitting: Internal Medicine

## 2013-10-10 VITALS — BP 135/85 | HR 76 | Temp 98.0°F | Ht 72.0 in | Wt 256.0 lb

## 2013-10-10 DIAGNOSIS — I10 Essential (primary) hypertension: Secondary | ICD-10-CM

## 2013-10-10 DIAGNOSIS — E119 Type 2 diabetes mellitus without complications: Secondary | ICD-10-CM

## 2013-10-10 DIAGNOSIS — E785 Hyperlipidemia, unspecified: Secondary | ICD-10-CM

## 2013-10-10 DIAGNOSIS — I4892 Unspecified atrial flutter: Secondary | ICD-10-CM

## 2013-10-10 LAB — HEMOGLOBIN A1C: Hgb A1c MFr Bld: 7 % — ABNORMAL HIGH (ref 4.6–6.5)

## 2013-10-10 LAB — HM DIABETES FOOT EXAM

## 2013-10-10 NOTE — Assessment & Plan Note (Signed)
Well controlled No bleeding complications on eliquis

## 2013-10-10 NOTE — Assessment & Plan Note (Signed)
Home bps 135/85

## 2013-10-10 NOTE — Progress Notes (Signed)
Pre visit review using our clinic review tool, if applicable. No additional management support is needed unless otherwise documented below in the visit note. 

## 2013-10-10 NOTE — Assessment & Plan Note (Signed)
Controlled Continue same meds 

## 2013-10-10 NOTE — Progress Notes (Signed)
Afib/flutter on elizuis  Lipids- tolerating meds Hyperglycemia- needs f/u  Obesity- not following an exercise or diet plan  Renal insufficiency - reviewed chart- creatinine is stable.   Past Medical History  Diagnosis Date  . GERD 12/07/2006  . HYPERGLYCEMIA 12/07/2006  . HYPERLIPIDEMIA 12/07/2006    mixed wth an atherogenic dyslipidemic pattern  . HYPERTENSION 12/07/2006  . OBESITY 11/10/2009  . THROMBOCYTOPENIA 12/07/2006  . TRANSAMINASES, SERUM, ELEVATED 12/07/2006  . SLEEP APNEA 10/22/2007    uses bipap  . 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 Claiborne Billings  . Diabetes mellitus without complication   . Renal insufficiency     Cr to 3 on ACE  . Nephrolithiasis   . Normal coronary arteries 04/06/2007    by cath EF 55%., last ECHO 10/13/10 EF>55% mild MR, last nuc 06/16/10 EF 57% low risk scan  . Pulmonary HTN     RV syst.  40-76mmHg- moderate by echo  . Palpitations     tachy  . PAF (paroxysmal atrial fibrillation) 04/09/2013    History   Social History  . Marital Status: Married    Spouse Name: N/A    Number of Children: 4  . Years of Education: N/A   Occupational History  . Not on file.   Social History Main Topics  . Smoking status: Former Smoker -- 10 years    Quit date: 08/04/1980  . Smokeless tobacco: Former Systems developer    Types: Chew    Quit date: 08/08/2004  . Alcohol Use: No  . Drug Use: No  . Sexual Activity: Not on file   Other Topics Concern  . Not on file   Social History Narrative  . No narrative on file    Past Surgical History  Procedure Laterality Date  . Cholecystectomy    . Rotator cuff repair    . Cystoscopy/retrograde/ureteroscopy  08/19/2011    Procedure: CYSTOSCOPY/RETROGRADE/URETEROSCOPY;  Surgeon: Hanley Ben, MD;  Location: [facility];  Service: Urology;  Laterality: Right;  JJ STENT PLACEMENT   . Cardiac catheterization  04/06/2007    normal cors  . Cardioversion N/A 08/05/2013    Procedure: CARDIOVERSION;   Surgeon: Troy Sine, MD;  Location: [facility] ENDOSCOPY;  Service: Cardiovascular;  Laterality: N/A;    Family History  Problem Relation Age of Onset  . Diabetes Mother   . Heart failure Mother   . Stroke Mother   . Kidney failure Father   . Diabetes Father     Allergies  Allergen Reactions  . Lisinopril     Renal insufficiency    Current Outpatient Prescriptions on File Prior to Visit  Medication Sig Dispense Refill  . apixaban (ELIQUIS) 5 MG TABS tablet Take 1 tablet (5 mg total) by mouth 2 (two) times daily.  28 tablet  0  . atorvastatin (LIPITOR) 10 MG tablet TAKE 1 TABLET DAILY AT BEDTIME.  30 tablet  5  . carvedilol (COREG) 25 MG tablet Take 2 tablets (50 mg total) by mouth 2 (two) times daily with a meal.  120 tablet  11  . Cholecalciferol (VITAMIN D3) 1000 UNITS CAPS Take 2,000 Units by mouth daily.       . diphenhydrAMINE (BENADRYL) 25 MG tablet Take 25 mg by mouth at bedtime as needed.      . furosemide (LASIX) 20 MG tablet Take 20 mg by mouth daily as needed for edema. Swelling      . metFORMIN (GLUCOPHAGE-XR) 500 MG 24 hr tablet Take 500 mg  by mouth 2 (two) times daily.      . Multiple Vitamin (MULTIVITAMIN) tablet Take 1 tablet by mouth daily.        . niacin (NIASPAN) 1000 MG CR tablet TAKE 1 TABLET BY MOUTH AT BEDTIME  30 tablet  5  . NON FORMULARY BIPAP therapy      . omeprazole (PRILOSEC) 20 MG capsule TAKE ONE CAPSULE BY MOUTH DAILY  30 capsule  5  . potassium citrate (UROCIT-K) 10 MEQ (1080 MG) SR tablet Take 10 mEq by mouth 3 (three) times daily with meals.       . propafenone (RYTHMOL) 225 MG tablet Take 1 tablet (225 mg total) by mouth every 8 (eight) hours.  90 tablet  5   No current facility-administered medications on file prior to visit.     patient denies chest pain, shortness of breath, orthopnea. Denies lower extremity edema, abdominal pain, change in appetite, change in bowel movements. Patient denies rashes, musculoskeletal complaints. No other  specific complaints in a complete review of systems.   BP 134/90  Pulse 76  Temp(Src) 98 F (36.7 C) (Oral)  Ht 6' (1.829 m)  Wt 256 lb (116.121 kg)  BMI 34.71 kg/m2  well-developed well-nourished male in no acute distress. HEENT exam atraumatic, normocephalic, neck supple without jugular venous distention. Chest clear to auscultation cardiac exam S1-S2 are regular. Abdominal exam overweight with bowel sounds, soft and nontender. Extremities no edema. Neurologic exam is alert with a normal gait.  HYPERTENSION Home bps 135/85  Atrial flutter Well controlled No bleeding complications on eliquis  HYPERLIPIDEMIA Controlled Continue same meds

## 2013-10-11 ENCOUNTER — Telehealth: Payer: Self-pay | Admitting: Internal Medicine

## 2013-10-11 ENCOUNTER — Ambulatory Visit: Payer: Medicare Other | Admitting: Internal Medicine

## 2013-10-11 NOTE — Telephone Encounter (Signed)
emmi mailed to patient °

## 2013-10-29 ENCOUNTER — Telehealth: Payer: Self-pay

## 2013-10-29 NOTE — Telephone Encounter (Signed)
Relevant patient education mailed to patient.  

## 2013-11-07 ENCOUNTER — Telehealth: Payer: Self-pay | Admitting: Internal Medicine

## 2013-11-07 NOTE — Telephone Encounter (Signed)
Pt would like to est with dr fry as new pt. Pt also would like his wife to est as well. Can I sch?

## 2013-11-07 NOTE — Telephone Encounter (Signed)
Sorry but I am too full to see them, thanks

## 2013-11-08 ENCOUNTER — Ambulatory Visit (INDEPENDENT_AMBULATORY_CARE_PROVIDER_SITE_OTHER): Payer: Medicare Other | Admitting: Gastroenterology

## 2013-11-08 ENCOUNTER — Telehealth: Payer: Self-pay | Admitting: *Deleted

## 2013-11-08 ENCOUNTER — Encounter: Payer: Self-pay | Admitting: Gastroenterology

## 2013-11-08 VITALS — BP 144/88 | HR 64 | Ht 70.25 in | Wt 257.1 lb

## 2013-11-08 DIAGNOSIS — Z860100 Personal history of colon polyps, unspecified: Secondary | ICD-10-CM | POA: Insufficient documentation

## 2013-11-08 DIAGNOSIS — K7689 Other specified diseases of liver: Secondary | ICD-10-CM

## 2013-11-08 DIAGNOSIS — K7581 Nonalcoholic steatohepatitis (NASH): Secondary | ICD-10-CM | POA: Insufficient documentation

## 2013-11-08 NOTE — Assessment & Plan Note (Signed)
Increased risk for chronic liver disease was discussed with the patient.  He was advised to attempt to lose weight.  We'll continue Glucophage.

## 2013-11-08 NOTE — Progress Notes (Signed)
_                                                                                                                History of Present Illness: Pleasant 73 year old white male with history of Karlene Lineman, colon polyps, diabetes, renal insufficiency, Eliquis, referred for followup colonoscopy.  He has no GI complaints including change of bowel habits, abdominal pain, melena or hematochezia.  Last colonoscopy in 2010 demonstrated an adenomatous polyp which was removed.    Past Medical History  Diagnosis Date  . GERD 12/07/2006  . HYPERGLYCEMIA 12/07/2006  . HYPERLIPIDEMIA 12/07/2006    mixed wth an atherogenic dyslipidemic pattern  . HYPERTENSION 12/07/2006  . OBESITY 11/10/2009  . THROMBOCYTOPENIA 12/07/2006  . TRANSAMINASES, SERUM, ELEVATED 12/07/2006  . SLEEP APNEA 10/22/2007    uses bipap  . 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 Claiborne Billings  . Diabetes mellitus without complication   . Renal insufficiency     Cr to 3 on ACE  . Nephrolithiasis   . Normal coronary arteries 04/06/2007    by cath EF 55%., last ECHO 10/13/10 EF>55% mild MR, last nuc 06/16/10 EF 57% low risk scan  . Pulmonary HTN     RV syst.  40-68mmHg- moderate by echo  . Palpitations     tachy  . PAF (paroxysmal atrial fibrillation) 04/09/2013  . Colon polyps   . Diverticulosis    Past Surgical History  Procedure Laterality Date  . Cholecystectomy    . Rotator cuff repair Right   . Cystoscopy/retrograde/ureteroscopy  08/19/2011    Procedure: CYSTOSCOPY/RETROGRADE/URETEROSCOPY;  Surgeon: Hanley Ben, MD;  Location: [facility];  Service: Urology;  Laterality: Right;  JJ STENT PLACEMENT   . Cardiac catheterization  04/06/2007    normal cors  . Cardioversion N/A 08/05/2013    Procedure: CARDIOVERSION;  Surgeon: Troy Sine, MD;  Location: [facility] ENDOSCOPY;  Service: Cardiovascular;  Laterality: N/A;   family history includes Cancer in his paternal uncle; Diabetes in his father and mother;  Heart failure in his mother; Kidney failure in his father; Stroke in his mother. Current Outpatient Prescriptions  Medication Sig Dispense Refill  . apixaban (ELIQUIS) 5 MG TABS tablet Take 1 tablet (5 mg total) by mouth 2 (two) times daily.  28 tablet  0  . atorvastatin (LIPITOR) 10 MG tablet TAKE 1 TABLET DAILY AT BEDTIME.  30 tablet  5  . carvedilol (COREG) 25 MG tablet Take 2 tablets (50 mg total) by mouth 2 (two) times daily with a meal.  120 tablet  11  . Cholecalciferol (VITAMIN D3) 1000 UNITS CAPS Take 2,000 Units by mouth daily.       . diphenhydrAMINE (BENADRYL) 25 MG tablet Take 25 mg by mouth at bedtime as needed.      . furosemide (LASIX) 20 MG tablet Take 20 mg by mouth daily as needed for edema. Swelling      . Krill Oil 300 MG CAPS Take 1 capsule by mouth daily.      Marland Kitchen  metFORMIN (GLUCOPHAGE-XR) 500 MG 24 hr tablet Take 500 mg by mouth 2 (two) times daily.      . Multiple Vitamin (MULTIVITAMIN) tablet Take 1 tablet by mouth daily.        . niacin (NIASPAN) 1000 MG CR tablet TAKE 1 TABLET BY MOUTH AT BEDTIME  30 tablet  5  . NON FORMULARY BIPAP therapy      . omeprazole (PRILOSEC) 20 MG capsule TAKE ONE CAPSULE BY MOUTH DAILY  30 capsule  5  . potassium citrate (UROCIT-K) 10 MEQ (1080 MG) SR tablet Take 10 mEq by mouth 3 (three) times daily with meals.       . propafenone (RYTHMOL) 225 MG tablet Take 1 tablet (225 mg total) by mouth every 8 (eight) hours.  90 tablet  5   No current facility-administered medications for this visit.   Allergies as of 11/08/2013 - Review Complete 11/08/2013  Allergen Reaction Noted  . Lisinopril  07/04/2011    reports that he quit smoking about 33 years ago. His smoking use included Cigarettes. He smoked 0.00 packs per day for 10 years. He quit smokeless tobacco use about 9 years ago. His smokeless tobacco use included Chew. He reports that he drinks alcohol. He reports that he does not use illicit drugs.     Review of Systems: Pertinent  positive and negative review of systems were noted in the above HPI section. All other review of systems were otherwise negative.  Vital signs were reviewed in today's medical record Physical Exam: General: Well developed , well nourished, no acute distress Skin: anicteric Head: Normocephalic and atraumatic Eyes:  sclerae anicteric, EOMI Ears: Normal auditory acuity Mouth: No deformity or lesions Neck: Supple, no masses or thyromegaly Lungs: Clear throughout to auscultation Heart: Regular rate and rhythm; no murmurs, rubs or bruits Abdomen: Soft, non tender and non distended. No masses, hepatosplenomegaly or hernias noted. Normal Bowel sounds Rectal:deferred Musculoskeletal: Symmetrical with no gross deformities  Skin: No lesions on visible extremities Pulses:  Normal pulses noted Extremities: No clubbing, cyanosis, edema or deformities noted Neurological: Alert oriented x 4, grossly nonfocal Cervical Nodes:  No significant cervical adenopathy Inguinal Nodes: No significant inguinal adenopathy Psychological:  Alert and cooperative. Normal mood and affect  See Assessment and Plan under Problem List

## 2013-11-08 NOTE — Assessment & Plan Note (Signed)
In followup colonoscopy.  I will check with his cardiologist whether Eliquis can be held.  The risk of holding anticoagulation therapy or antiplatelet medications was discussed including the increased risk for thromboembolic disease that may include DVT, pulmonary emboli and stroke. The patient understands this risk and is willing to proceed with temporally holding the medication provided that this is approved by her PCP or cardiologist.

## 2013-11-08 NOTE — Telephone Encounter (Signed)
  11/08/2013   RE: Martin Cordova DOB: May 10, 1941 MRN: [id number]   Dear  Dr Claiborne Billings  We have scheduled the above patient for an endoscopic procedure. Our records show that he is on anticoagulation therapy.   Please advise as to how long the patient may come off his therapy of Eliquis prior to the procedure, which is scheduled for 01/23/2014.  Please fax back/ or route the completed form to [place] at [phone].   Sincerely,    Oda Kilts

## 2013-11-08 NOTE — Telephone Encounter (Signed)
Ok to hold for 48 hrs

## 2013-11-08 NOTE — Patient Instructions (Addendum)
You have been scheduled for a colonoscopy with propofol. Please follow written instructions given to you at your visit today.  Please pick up your prep kit at the pharmacy within the next 1-3 days. If you use inhalers (even only as needed), please bring them with you on the day of your procedure. Your physician has requested that you go to www.startemmi.com and enter the access code given to you at your visit today. This web site gives a general overview about your procedure. However, you should still follow specific instructions given to you by our office regarding your preparation for the procedure.  We will contact you about holding your Eliquis We are giving you a Suprep sample kit today

## 2013-11-11 NOTE — Telephone Encounter (Signed)
lmom for pt to cb

## 2013-11-11 NOTE — Telephone Encounter (Signed)
Patient aware to hold Eliquis 48 hours before procedure

## 2013-11-12 NOTE — Telephone Encounter (Signed)
Pt called back, pt is aware.

## 2013-11-14 ENCOUNTER — Encounter: Payer: Self-pay | Admitting: Gastroenterology

## 2013-11-14 NOTE — Telephone Encounter (Signed)
Mr. Dirk Dress -   This was sent to me, Jules Husbands, when it was meant for Dr. Ellouise Newer who I have copied on this message. My apologies for any delay but I only work intermittently at night at [employer].   Many thanks,  Edison Nasuti

## 2013-11-15 NOTE — Telephone Encounter (Signed)
Note recieved

## 2013-11-18 ENCOUNTER — Ambulatory Visit (INDEPENDENT_AMBULATORY_CARE_PROVIDER_SITE_OTHER): Payer: Medicare Other | Admitting: Cardiovascular Disease

## 2013-11-18 VITALS — BP 150/84 | HR 58 | Ht 71.0 in | Wt 253.7 lb

## 2013-11-18 DIAGNOSIS — E1121 Type 2 diabetes mellitus with diabetic nephropathy: Secondary | ICD-10-CM

## 2013-11-18 DIAGNOSIS — I480 Paroxysmal atrial fibrillation: Secondary | ICD-10-CM

## 2013-11-18 DIAGNOSIS — G4730 Sleep apnea, unspecified: Secondary | ICD-10-CM

## 2013-11-18 DIAGNOSIS — E119 Type 2 diabetes mellitus without complications: Secondary | ICD-10-CM

## 2013-11-18 DIAGNOSIS — R7989 Other specified abnormal findings of blood chemistry: Secondary | ICD-10-CM

## 2013-11-18 DIAGNOSIS — N058 Unspecified nephritic syndrome with other morphologic changes: Secondary | ICD-10-CM

## 2013-11-18 DIAGNOSIS — I4892 Unspecified atrial flutter: Secondary | ICD-10-CM

## 2013-11-18 DIAGNOSIS — E785 Hyperlipidemia, unspecified: Secondary | ICD-10-CM

## 2013-11-18 DIAGNOSIS — E1129 Type 2 diabetes mellitus with other diabetic kidney complication: Secondary | ICD-10-CM

## 2013-11-18 DIAGNOSIS — I10 Essential (primary) hypertension: Secondary | ICD-10-CM

## 2013-11-18 DIAGNOSIS — E782 Mixed hyperlipidemia: Secondary | ICD-10-CM

## 2013-11-18 DIAGNOSIS — I483 Typical atrial flutter: Secondary | ICD-10-CM

## 2013-11-18 DIAGNOSIS — I4891 Unspecified atrial fibrillation: Secondary | ICD-10-CM

## 2013-11-18 MED ORDER — APIXABAN 5 MG PO TABS: 5.0000 mg | ORAL_TABLET | Freq: Two times a day (BID) | ORAL | Status: DC

## 2013-11-18 MED ORDER — METFORMIN HCL ER (MOD) 1000 MG PO TB24: 1000.0000 mg | ORAL_TABLET | Freq: Two times a day (BID) | ORAL | Status: DC

## 2013-11-18 NOTE — Patient Instructions (Signed)
Your physician has recommended you make the following change in your medication: Increase the metformin to 1000 mg twice daily. A new prescription has been sent to the pharmacy.  Your physician recommends that you return for lab work in: fasting in 4 months. A lab slip will be sent to you at that. Time.  Your physician recommends that you schedule a follow-up appointment in: 4 months.

## 2013-11-21 ENCOUNTER — Telehealth: Payer: Self-pay | Admitting: Cardiovascular Disease

## 2013-11-21 DIAGNOSIS — I480 Paroxysmal atrial fibrillation: Secondary | ICD-10-CM

## 2013-11-21 MED ORDER — APIXABAN 5 MG PO TABS: 5.0000 mg | ORAL_TABLET | Freq: Two times a day (BID) | ORAL | Status: DC

## 2013-11-21 NOTE — Telephone Encounter (Signed)
Spoke to [facility] at [facility]. She states patient is at pharmacy to pick up  Refill of Eqliuis. Need an order. RN gave verbal order to refill medication . 5 mg  Bid #60 x 11 or if patient wants 90 day supply  #180 tablets x 3. Melanie verbalized understanding.

## 2013-11-22 ENCOUNTER — Encounter: Payer: Self-pay | Admitting: Gastroenterology

## 2013-12-02 ENCOUNTER — Encounter: Payer: Self-pay | Admitting: Cardiovascular Disease

## 2013-12-02 DIAGNOSIS — E119 Type 2 diabetes mellitus without complications: Secondary | ICD-10-CM | POA: Insufficient documentation

## 2013-12-02 NOTE — Progress Notes (Signed)
Patient ID: Martin Cordova, male   DOB: [birth year], 73 y.o.   MRN: [id number]       HPI: Martin Cordova is a 73 y.o. male is who presents to the office today for cardiology followup evaluation of his A Flutter.  Mr. 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 In the past, he developed an obstructive uropathy attributed to kidney stones; creatinine had risen up to 3 and ultimately improved and stabilized at approximately 1.5. Earlier this year with his blood pressure being elevated I added amlodipine 5 mg to his medical regimen.  Mr. Tallarico uses his BiPAP daily and does note good sleep. These does complain of ankle swelling. When I l saw him in June I suggested he increase his furosemide and take 20 mg every other day to help control some of his peripheral edema. He had been previously taken off his lisinopril when his creatinine had risen in the setting of his obstructive uropathy. He underwent laboratory in July which showed a total cholesterol 108 triglycerides 183 HDL 31 LDL 40 but LDL particle number was not obtained. Hemoglobin A1c was elevated at 6.8. In January when taken by Dr. Leanne Chang was 6.7. Between January and June he did have nutritional consultation with no improvement in his hemoglobin A1c. Review of the chart indicates that his HbA1c has been in the 6.5 range for almost 3 years.  When I saw him several months ago he was complaining of episodic palpitations. These were becoming worse and seem to be occurring every other day;  typically occur in the late afternoon or early morning. He's not had any change in caffeine intake. He denies associated chest pressure or presyncope. At that time I discontinued his amlodipine substituted this with Cardizem CD 120 mg. I had him wear a [employer] event  monitor. On monitor he was found to  have recurrent episodes of paroxysmal atrial fibrillation. He was started on Eliquis 5 mg bid and was advised to discontinue his aspirin. When I saw him 2  months ago because of his complaints of still experiencing recurrent episodes I elected to initiate antiarrhythmic therapy with Rythmol 150 mg every 8 hours. At that time, he was in sinus rhythm with bradycardia at 55 and I recommended a dose reduction of his carvedilol from 50 twice a day down to initially 25 twice a day. Subsequently, on the reduced dose he did note some additional palpitations and his dose was adjusted to 37.5 mg in the morning and 25 mg at night.  Due to recurrent atrial fib/flutter rhythm with a ventricular rate in the low 100's   I further titrated his carvedilol back to 50 mg twice a day. He has felt improved on this regimen. He denies any awareness that his pulse is irregular or very fast. He also was hypertensive with a blood pressure of 144/102.   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 He has been on CPAP therapy initially for approximately 8 years and has been on BiPAP auto SV for over 4 years. He admits to using his BiPAP with 100% compliance.  On 07/08/2013 I discontinued his diltiazem and further titrated his Rythmol to 225  mg every 8 hours. He also is on carvedilol and 50 mg twice a day. He feels his ventricular rate has improved. He has been on Eliquis  was 5 mg twice a day as anticoagulant for several months.  He underwent successful cardioversion on 08/05/2013.  Since that time, he is unaware of any recurrent atrial fibrillation.  He has noticed more energy.  Recent hemoglobin A1c was still elevated at 7.0.  He tells me in August he will need to have colonoscopy.  Past Medical History  Diagnosis Date  . GERD 12/07/2006  . HYPERGLYCEMIA 12/07/2006  .  HYPERLIPIDEMIA 12/07/2006    mixed wth an atherogenic dyslipidemic pattern  . HYPERTENSION 12/07/2006  . OBESITY 11/10/2009  . THROMBOCYTOPENIA 12/07/2006  . TRANSAMINASES, SERUM, ELEVATED 12/07/2006  . SLEEP APNEA 10/22/2007    uses bipap  . 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 Claiborne Billings  . Diabetes mellitus without complication   . Renal insufficiency     Cr to 3 on ACE  . Nephrolithiasis   . Normal coronary arteries 04/06/2007    by cath EF 55%., last ECHO 10/13/10 EF>55% mild MR, last nuc 06/16/10 EF 57% low risk scan  . Pulmonary HTN     RV syst.  40-49mHg- moderate by echo  . Palpitations     tachy  . PAF (paroxysmal atrial fibrillation) 04/09/2013    Past Surgical History  Procedure Laterality Date  . Cholecystectomy    . Rotator cuff repair    . Cystoscopy/retrograde/ureteroscopy  08/19/2011    Procedure: CYSTOSCOPY/RETROGRADE/URETEROSCOPY;  Surgeon: MHanley Ben MD;  [facility]  Service: Urology;  Laterality: Right;  JJ STENT PLACEMENT   . Cardiac catheterization  04/06/2007    normal cors    Allergies  Allergen Reactions  . Lisinopril     Renal insufficiency    Current Outpatient Prescriptions  Medication Sig Dispense Refill  . apixaban (ELIQUIS) 5 MG TABS tablet Take 1 tablet (5 mg total) by mouth 2 (two) times daily.  28 tablet  0  . atorvastatin (LIPITOR) 10 MG tablet Take 10 mg by mouth daily.      . carvedilol (COREG) 25 MG tablet Take 2 tablets (50 mg total) by mouth 2 (two) times daily with a meal.  120 tablet  11  . cetirizine (ZYRTEC) 10 MG tablet Take 10 mg by mouth daily.      . Cholecalciferol (VITAMIN D3) 1000 UNITS CAPS Take 2,000 Units by mouth daily.       .Marland Kitchendiltiazem (CARDIZEM CD) 120 MG 24 hr capsule Take at night.  30 capsule  6  . furosemide (LASIX) 20 MG tablet Take 20 mg by mouth daily as needed for edema. Swelling      . metFORMIN (GLUCOPHAGE-XR) 500 MG 24 hr tablet Take 500 mg by mouth 2 (two) times  daily.      . Multiple Vitamin (MULTIVITAMIN) tablet Take 1 tablet by mouth daily.        . niacin (NIASPAN) 1000 MG CR tablet TAKE 1 TABLET BY MOUTH AT BEDTIME  30 tablet  6  . NON FORMULARY BIPAP therapy      . omeprazole (PRILOSEC) 20 MG capsule TAKE ONE CAPSULE BY MOUTH DAILY  30 capsule  5  . potassium citrate (UROCIT-K) 10 MEQ (1080 MG) SR tablet Take 10 mEq by mouth 3 (three) times daily with meals.       . propafenone (RYTHMOL) 150 MG tablet Take 1 tablet (150 mg  total) by mouth every 8 (eight) hours.  60 tablet  6   No current facility-administered medications for this visit.    Socially he is married has 4 children and 2 grandchildren. He is a distant relative to the "Meinhardt brothers." He does try to walk and exercise. There is no tobacco use. He does drink alcohol to  ROS General: Negative; No fevers, chills, or night sweats;  HEENT: Negative; No changes in vision or hearing, sinus congestion, difficulty swallowing Pulmonary: Negative; No cough, wheezing, shortness of breath, hemoptysis Cardiovascular: Negative; No chest pain, presyncope, syncope, palpatations No recent peripheral edema GI: Negative; No nausea, vomiting, diarrhea, or abdominal pain GU: Renal function has stabilized; No dysuria, hematuria, or difficulty voiding; some difficulty with erectile function Musculoskeletal: Negative; no myalgias, joint pain, or weakness Hematologic/Oncology: Negative; no easy bruising, bleeding Endocrine: Negative; no heat/cold intolerance; no diabetes Neuro: Negative; no changes in balance, headaches Skin: Negative; No rashes or skin lesions Psychiatric: Negative; No behavioral problems, depression Sleep: He is using his BiPAP therapy with 100% compliance.  No snoring, daytime sleepiness, hypersomnolence, bruxism, restless legs, hypnogognic hallucinations, no cataplexy Other comprehensive 14 point system review is negative.  PE BP 140/100  Pulse 98  Ht 6' (1.829 m)  Wt 259 lb  4.8 oz (117.618 kg)  BMI 35.16 kg/m2  He has lost 6 pounds since his last visit. Repeat blood pressure by me was 122/70. Pulse was 86 General: Alert, oriented, no distress.  HEENT: Normocephalic, atraumatic. Pupils round and reactive; sclera anicteric; no xanthelasmas. Extraocular muscles are full. There is no lid lag. Nose without nasal septal hypertrophy Mouth/Parynx benign; Mallinpatti scale 3  Neck: Thick, no JVD, no carotid bruits; normal carotid upstroke Lungs: clear to ausculatation and percussion; no wheezing or rales Chest wall: No tenderness to palpation Heart: PMI not displaced.  Heart rate regular at approximately 60 beats per minute; No ectopy, s1 s2 normal 1/6 sem; no ectopy heard on auscultation Abdomen: Moderate central adiposity ;soft, nontender; no hepatosplenomehaly, BS+; abdominal aorta nontender and not dilated by palpation. Back: No CVA tenderness Pulses 2+ Extremities: Resolution of prior bilateral ankle swelling, Homan's sign negative  Neurologic: grossly nonfocal; cranial nerves intact. Psychological: Normal affect and mood  ECG (independently read by me): Sinus bradycardia 58 beats per minute.  PR interval 198 ms; QTc interval 371 ms.  Nonspecific ST changes.  07/29/2013 ECG  (independently read by me): Atrial flutter with 2:1 block with a ventricular rate of 87 beats per minute. Atrial rate is approximately 360 ms. 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 fib flutter now with right bundle branch block and repolarization changes.  Prior ECG of 06/04/2013: EKG  suggests probable atrial flutter with a ventricular rate of 105 beats per minute. There also are frequent PVCs. QTc interval is 436 ms. They're nonspecific T changes.  LABS:  BMET    Component Value Date/Time   NA 144 04/30/2013 0923   K 4.6 04/30/2013 0923   CL 105 04/30/2013 0923   CO2 27 04/30/2013 0923   GLUCOSE 128* 04/30/2013  0923   BUN 21 04/30/2013 0923   CREATININE 1.58* 04/30/2013 0923   CREATININE 1.6* 04/12/2013 0825   CALCIUM 9.6 04/30/2013 0923   CALCIUM 9.7 07/03/2012 0942   GFRNONAA 46.00 02/02/2010 0808   GFRAA  Value: 45        The eGFR has been calculated using the MDRD equation. This calculation has not been validated in  all clinical situations. eGFR's persistently <60 mL/min signify possible Chronic Kidney Disease.* 09/25/2008 0352     Hepatic Function Panel     Component Value Date/Time   PROT 7.0 04/30/2013 0923   ALBUMIN 4.5 04/30/2013 0923   AST 28 04/30/2013 0923   ALT 45 04/30/2013 0923   ALKPHOS 43 04/30/2013 0923   BILITOT 0.9 04/30/2013 0923   BILIDIR 0.2 04/12/2013 0825   IBILI 1.1* 09/25/2008 0352     CBC    Component Value Date/Time   WBC 4.6 04/30/2013 0923   RBC 4.48 04/30/2013 0923   HGB 13.6 04/30/2013 0923   HCT 40.1 04/30/2013 0923   PLT 95* 04/30/2013 0923   MCV 89.5 04/30/2013 0923   MCH 30.4 04/30/2013 0923   MCHC 33.9 04/30/2013 0923   RDW 14.0 04/30/2013 0923   LYMPHSABS 0.9 07/03/2012 0942   MONOABS 0.4 07/03/2012 0942   EOSABS 0.1 07/03/2012 0942   BASOSABS 0.0 07/03/2012 0942     BNP No results found for this basename: probnp    Lipid Panel     Component Value Date/Time   CHOL 123 04/12/2013 0825   TRIG 229.0* 04/12/2013 0825   HDL 34.70* 04/12/2013 0825   CHOLHDL 4 04/12/2013 0825   VLDL 45.8* 04/12/2013 0825   LDLCALC 40 12/06/2012 0823     RADIOLOGY: No results found.    ASSESSMENT AND PLAN: Mr. Molinelli has documented normal coronary arteries by heart catheterization in 2008. He has a history of hypertension, mixed hyperlipidemia, and has paroxysmal atrial fibrillation/flutter.  He has been maintained on carvedilol 50 mg twice a day.  In addition to pop off and on 225 mg every 8 hours.  He underwent successful cardioversion 3 months ago.  ECG today confirms maintenance of normal sinus rhythm/sinus bradycardia.  He is diabetic.  Most recent  hemoglobin A1c is 7.0.  I have recommended further titration of his metformin to 1000 g twice a day.  He tells me his primary care, physician will be giving up his primary practice due to administrative responsibilities.  He will continue to take eliquis for anticoagulation therapy.  However, he will need to hold eliquis for at least 48 hours prior to planned colonoscopy in August.  She will continue to use his BiPAP with 100% compliance.  He is aware that recurrence is significantly greater with AF if his sleep apnea is untreated.  I will see him in 4 months for followup evaluation with laboratory prior to that office visit.    Troy Sine, MD, [facility]  07/08/2013 9:18 AM

## 2013-12-15 ENCOUNTER — Other Ambulatory Visit: Payer: Self-pay | Admitting: Internal Medicine

## 2013-12-30 ENCOUNTER — Telehealth: Payer: Self-pay | Admitting: Cardiovascular Disease

## 2013-12-30 NOTE — Telephone Encounter (Signed)
Returned a call to patient. Left message that the lab orders will be mailed closer to the time of the appointment.

## 2013-12-30 NOTE — Telephone Encounter (Signed)
Please mail out a lab order before appt on 03/21/14.. Thanks

## 2014-01-13 ENCOUNTER — Other Ambulatory Visit: Payer: Self-pay

## 2014-01-13 ENCOUNTER — Telehealth: Payer: Self-pay | Admitting: Internal Medicine

## 2014-01-13 MED ORDER — PROPAFENONE HCL 225 MG PO TABS: 225.0000 mg | ORAL_TABLET | Freq: Three times a day (TID) | ORAL | Status: DC

## 2014-01-13 MED ORDER — ATORVASTATIN CALCIUM 10 MG PO TABS: 10.0000 mg | ORAL_TABLET | Freq: Every day | ORAL | Status: DC

## 2014-01-13 NOTE — Telephone Encounter (Signed)
Pt would like to see if dr Elease Hashimoto will accept him and wife as new pt

## 2014-01-13 NOTE — Telephone Encounter (Signed)
Pt would like to est with dr fry. Can I [place]?

## 2014-01-13 NOTE — Telephone Encounter (Signed)
Sorry but I am too full  

## 2014-01-13 NOTE — Telephone Encounter (Signed)
Rx was sent to pharmacy electronically. 

## 2014-01-14 NOTE — Telephone Encounter (Signed)
Let's encourage these folks to try one of our providers who do not have full panels. I am sorry but I have taken on more than I should have already.

## 2014-01-14 NOTE — Telephone Encounter (Signed)
Pt is aware.  

## 2014-01-23 ENCOUNTER — Telehealth: Payer: Self-pay | Admitting: Cardiovascular Disease

## 2014-01-23 ENCOUNTER — Other Ambulatory Visit: Payer: Self-pay | Admitting: *Deleted

## 2014-01-23 ENCOUNTER — Encounter: Payer: Self-pay | Admitting: Gastroenterology

## 2014-01-23 ENCOUNTER — Ambulatory Visit (AMBULATORY_SURGERY_CENTER): Payer: Medicare Other | Admitting: Gastroenterology

## 2014-01-23 ENCOUNTER — Telehealth: Payer: Self-pay | Admitting: *Deleted

## 2014-01-23 VITALS — BP 145/70 | HR 54 | Temp 97.6°F | Resp 17 | Ht 70.0 in | Wt 257.0 lb

## 2014-01-23 DIAGNOSIS — D126 Benign neoplasm of colon, unspecified: Secondary | ICD-10-CM

## 2014-01-23 DIAGNOSIS — E119 Type 2 diabetes mellitus without complications: Secondary | ICD-10-CM

## 2014-01-23 DIAGNOSIS — I483 Typical atrial flutter: Secondary | ICD-10-CM

## 2014-01-23 DIAGNOSIS — K5730 Diverticulosis of large intestine without perforation or abscess without bleeding: Secondary | ICD-10-CM

## 2014-01-23 DIAGNOSIS — I480 Paroxysmal atrial fibrillation: Secondary | ICD-10-CM

## 2014-01-23 DIAGNOSIS — E782 Mixed hyperlipidemia: Secondary | ICD-10-CM

## 2014-01-23 DIAGNOSIS — Z86010 Personal history of colonic polyps: Secondary | ICD-10-CM

## 2014-01-23 LAB — GLUCOSE, CAPILLARY
Glucose-Capillary: 129 mg/dL — ABNORMAL HIGH (ref 70–99)
Glucose-Capillary: 138 mg/dL — ABNORMAL HIGH (ref 70–99)

## 2014-01-23 MED ORDER — SODIUM CHLORIDE 0.9 % IV SOLN: 500.0000 mL | INTRAVENOUS | Status: DC

## 2014-01-23 NOTE — Telephone Encounter (Signed)
Pt has an appt with Dr Claiborne Billings on 01-31-14. Does he need lab work before this?

## 2014-01-23 NOTE — Patient Instructions (Addendum)
Resume eiliquis in 48 hours  Discharge instructions given with verbal understanding. Handouts on polyps and diverticulosis. Resume previous medications. YOU HAD AN ENDOSCOPIC PROCEDURE TODAY AT [facility]: Refer to the procedure report that was given to you for any specific questions about what was found during the examination.  If the procedure report does not answer your questions, please call your gastroenterologist to clarify.  If you requested that your care partner not be given the details of your procedure findings, then the procedure report has been included in a sealed envelope for you to review at your convenience later.  YOU SHOULD EXPECT: Some feelings of bloating in the abdomen. Passage of more gas than usual.  Walking can help get rid of the air that was put into your GI tract during the procedure and reduce the bloating. If you had a lower endoscopy (such as a colonoscopy or flexible sigmoidoscopy) you may notice spotting of blood in your stool or on the toilet paper. If you underwent a bowel prep for your procedure, then you may not have a normal bowel movement for a few days.  DIET: Your first meal following the procedure should be a light meal and then it is ok to progress to your normal diet.  A half-sandwich or bowl of soup is an example of a good first meal.  Heavy or fried foods are harder to digest and may make you feel nauseous or bloated.  Likewise meals heavy in dairy and vegetables can cause extra gas to form and this can also increase the bloating.  Drink plenty of fluids but you should avoid alcoholic beverages for 24 hours.  ACTIVITY: Your care partner should take you home directly after the procedure.  You should plan to take it easy, moving slowly for the rest of the day.  You can resume normal activity the day after the procedure however you should NOT DRIVE or use heavy machinery for 24 hours (because of the sedation medicines used during the test).     SYMPTOMS TO REPORT IMMEDIATELY: A gastroenterologist can be reached at any hour.  During normal business hours, 8:30 AM to 5:00 PM Monday through Friday, call [phone].  After hours and on weekends, please call the GI answering service at [phone] who will take a message and have the physician on call contact you.   Following lower endoscopy (colonoscopy or flexible sigmoidoscopy):  Excessive amounts of blood in the stool  Significant tenderness or worsening of abdominal pains  Swelling of the abdomen that is new, acute  Fever of 100F or higher FOLLOW UP: If any biopsies were taken you will be contacted by phone or by letter within the next 1-3 weeks.  Call your gastroenterologist if you have not heard about the biopsies in 3 weeks.  Our staff will call the home number listed on your records the next business day following your procedure to check on you and address any questions or concerns that you may have at that time regarding the information given to you following your procedure. This is a courtesy call and so if there is no answer at the home number and we have not heard from you through the emergency physician on call, we will assume that you have returned to your regular daily activities without incident.  SIGNATURES/CONFIDENTIALITY: You and/or your care partner have signed paperwork which will be entered into your electronic medical record.  These signatures attest to the fact that that the information above  on your After Visit Summary has been reviewed and is understood.  Full responsibility of the confidentiality of this discharge information lies with you and/or your care-partner.

## 2014-01-23 NOTE — Telephone Encounter (Signed)
Returned a call to patient. Informed him that he will be coming for a sleep evaluation, however since Dr. Claiborne Billings changed his metformin I will order labs to see what his glucose values are since medication change.

## 2014-01-23 NOTE — Progress Notes (Signed)
A/ox3 pleased with MAC, report to Celia RN 

## 2014-01-23 NOTE — Op Note (Signed)
[facility]  [address] [place], [address]   COLONOSCOPY PROCEDURE REPORT  PATIENT: Martin Cordova, Martin Cordova  MR#: [id number] BIRTHDATE: [birth year] , 73  yrs. old GENDER: Male ENDOSCOPIST: Inda Castle, MD REFERRED BY: PROCEDURE DATE:  01/23/2014 PROCEDURE:   Colonoscopy with snare polypectomy First Screening Colonoscopy - Avg.  risk and is 50 yrs.  old or older - No.  Prior Negative Screening - Now for repeat screening. N/A  History of Adenoma - Now for follow-up colonoscopy & has been > or = to 3 yrs.  Yes hx of adenoma.  Has been 3 or more years since last colonoscopy.  Polyps Removed Today? Yes. ASA CLASS:   Class II INDICATIONS:Patient's personal history of adenomatous colon polyps 2010 MEDICATIONS: MAC sedation, administered by CRNA and propofol (Diprivan) 200mg  IV  DESCRIPTION OF PROCEDURE:   After the risks benefits and alternatives of the procedure were thoroughly explained, informed consent was obtained.  A digital rectal exam revealed no abnormalities of the rectum.   The [id number]  endoscope was introduced through the anus and advanced to the cecum, which was identified by both the appendix and ileocecal valve. No adverse events experienced.   The quality of the prep was excellent using Suprep  The instrument was then slowly withdrawn as the colon was fully examined.      COLON FINDINGS: Two sessile polyps measuring 2-3 mm in size were found at the cecum.  A polypectomy was performed with a cold snare. The resection was complete and the polyp tissue was completely retrieved.   A sessile polyp measuring 3 mm in size was found in the descending colon.  A polypectomy was performed with a cold snare.  The resection was complete and the polyp tissue was completely retrieved.   Moderate diverticulosis was noted in the sigmoid colon and descending colon.   The colon was otherwise normal.  There was no diverticulosis, inflammation, polyps  or cancers unless previously stated.  Retroflexed views revealed no abnormalities. The time to cecum=2 minutes 10 seconds.  Withdrawal time=10 minutes 15 seconds.  The scope was withdrawn and the procedure completed. COMPLICATIONS: There were no complications.  ENDOSCOPIC IMPRESSION: 1.   Two sessile polyps measuring 2-3 mm in size were found at the cecum; polypectomy was performed with a cold snare 2.   Sessile polyp measuring 3 mm in size was found in the descending colon; polypectomy was performed with a cold snare 3.   Moderate diverticulosis was noted in the sigmoid colon and descending colon 4.   The colon was otherwise normal  RECOMMENDATIONS: 1.  If the polyp(s) removed today are proven to be adenomatous (pre-cancerous) polyps, you will need a colonoscopy in 3 years. Otherwise you should continue to follow colorectal cancer screening guidelines for "routine risk" patients with a colonoscopy in 10 years.  You will receive a letter within 1-2 weeks with the results of your biopsy as well as final recommendations.  Please call my office if you have not received a letter after 3 weeks. 2.  Resume eliquis 48 hours   eSigned:  Inda Castle, MD 01/23/2014 8:33 AM   cc: Lisabeth Pick, MD   PATIENT NAME:  Martin Cordova, Martin Cordova MR#: [id number]

## 2014-01-23 NOTE — Telephone Encounter (Signed)
Called patient to inform him that i see in the computer where Dr. Deatra Ina done tested blood sugar today. He will wait until October to get labs drawn.

## 2014-01-23 NOTE — Progress Notes (Signed)
Called to room to assist during endoscopic procedure.  Patient ID and intended procedure confirmed with present staff. Received instructions for my participation in the procedure from the performing physician.  

## 2014-01-24 ENCOUNTER — Telehealth: Payer: Self-pay

## 2014-01-24 NOTE — Telephone Encounter (Signed)
  Follow up Call-  Call back number 01/23/2014  Post procedure Call Back phone  # [phone]  Permission to leave phone message Yes     Patient questions:  Do you have a fever, pain , or abdominal swelling? No. Pain Score  0 *  Have you tolerated food without any problems? Yes.    Have you been able to return to your normal activities? Yes.    Do you have any questions about your discharge instructions: Diet   No. Medications  No. Follow up visit  No.  Do you have questions or concerns about your Care? No.  Actions: * If pain score is 4 or above: No action needed, pain <4.  Per the pt,"for the experience I had it was very pleasant". maw

## 2014-01-31 ENCOUNTER — Encounter: Payer: Self-pay | Admitting: Cardiovascular Disease

## 2014-01-31 ENCOUNTER — Ambulatory Visit (INDEPENDENT_AMBULATORY_CARE_PROVIDER_SITE_OTHER): Payer: Medicare Other | Admitting: Cardiovascular Disease

## 2014-01-31 VITALS — BP 191/89 | HR 69 | Ht 72.0 in | Wt 257.1 lb

## 2014-01-31 DIAGNOSIS — I480 Paroxysmal atrial fibrillation: Secondary | ICD-10-CM

## 2014-01-31 DIAGNOSIS — E669 Obesity, unspecified: Secondary | ICD-10-CM

## 2014-01-31 DIAGNOSIS — E785 Hyperlipidemia, unspecified: Secondary | ICD-10-CM

## 2014-01-31 DIAGNOSIS — G4730 Sleep apnea, unspecified: Secondary | ICD-10-CM

## 2014-01-31 DIAGNOSIS — I4891 Unspecified atrial fibrillation: Secondary | ICD-10-CM

## 2014-01-31 DIAGNOSIS — I10 Essential (primary) hypertension: Secondary | ICD-10-CM

## 2014-01-31 MED ORDER — HYDRALAZINE HCL 25 MG PO TABS: ORAL_TABLET | ORAL | Status: DC

## 2014-01-31 NOTE — Patient Instructions (Signed)
Your physician has recommended you make the following change in your medication: Start new prescription for hydralazine 25 MG as directed on the bottle. This has already been sent to your pharmacy.  Your physician recommends that you schedule a follow-up appointment in: October

## 2014-02-02 ENCOUNTER — Encounter: Payer: Self-pay | Admitting: Cardiovascular Disease

## 2014-02-02 NOTE — Progress Notes (Signed)
Patient ID: Martin Cordova, male   DOB: 1941/05/29, 73 y.o.   MRN: [id number]       HPI: Martin Cordova is a 73 y.o. male is who presents to the office today for cardiology and sleep clinic followup evaluation.  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 In the past, he developed an obstructive uropathy attributed to kidney stones; creatinine had risen up to 3 and ultimately improved and stabilized at approximately 1.5. Earlier this year with his blood pressure being elevated I added amlodipine 5 mg to his medical regimen.  Martin Cordova uses his BiPAP daily and does note good sleep. These does complain of ankle swelling. When I l saw him in June I suggested he increase his furosemide and take 20 mg every other day to help control some of his peripheral edema. He had been previously taken off his lisinopril when his creatinine had risen in the setting of his obstructive uropathy. He underwent laboratory in July which showed a total cholesterol 108 triglycerides 183 HDL 31 LDL 40 but LDL particle number was not obtained. Hemoglobin A1c was elevated at 6.8. In January when taken by Dr. Leanne Cordova was 6.7. Between January and June he did have nutritional consultation with no improvement in his hemoglobin A1c. Review of the chart indicates that his HbA1c has been in the 6.5 range for almost 3 years.  When I saw him several months ago he was complaining of episodic palpitations. These were becoming worse and seem to be occurring every other day;  typically occur in the late afternoon or early morning. He's not had any change in caffeine intake. He denies associated chest pressure or presyncope. At that time I discontinued his amlodipine substituted this with Cardizem CD 120 mg. I had him wear a [employer] event  monitor. On monitor he was found to  have recurrent episodes of paroxysmal atrial fibrillation. He was started on Eliquis 5 mg bid and was advised to discontinue his aspirin. When I saw him 2  months ago because of his complaints of still experiencing recurrent episodes I elected to initiate antiarrhythmic therapy with Rythmol 150 mg every 8 hours. At that time, he was in sinus rhythm with bradycardia at 55 and I recommended a dose reduction of his carvedilol from 50 twice a day down to initially 25 twice a day. Subsequently, on the reduced dose he did note some additional palpitations and his dose was adjusted to 37.5 mg in the morning and 25 mg at night.  Due to recurrent atrial fib/flutter rhythm with a ventricular rate in the low 100's   I further titrated his carvedilol back to 50 mg twice a day. He has felt improved on this regimen. He denies any awareness that his pulse is irregular or very fast. He also was hypertensive with a blood pressure of 144/102. On 07/08/2013 I discontinued his diltiazem and further titrated his Rythmol to 225 mg every 8 hours.  He has been on Eliquis  was 5 mg twice a day as anticoagulant for several months.  He underwent successful cardioversion on 08/05/2013.  Since that time, he is unaware of any recurrent atrial fibrillation.  He has noticed more energy.  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 He has been on CPAP therapy initially for approximately 8 years and has been on BiPAP auto SV for over 4 years. He admits to using his BiPAP with 100% compliance.  Since I last saw him, he was able to obtain a new BiPAP machine in June.  He feels that his machine is significantly improved from his prior one.  A download of his BiPAP auto SV machine indicates that 90% of the time is EPAP pressure was 17 and 90% of the time his pressure support was 5.8, giving an  IPAP of 21.8 cm.  He is using it 100% of the time and is averaging 5 hours and 49 minutes on his most recent download from 12/07/2013 through 01/05/2014.  His average AHI was 7.5.  His device setting maximum BiPAP pressure is 25 cm in maximum EPAP pressure 21 cm.  His average central event index is 1.3, and average obstructive apneic index 0.2, with an average copy index of 6.0.  Average breath.  Rate is 18 breaths per minute with a minute ventilation of 12.1.  Average total body may 670 mL.  An Epworth score was recalculated today and this was excellent at 3 arguing against residual daytime sleepiness.   Past Medical History  Diagnosis Date  . GERD 12/07/2006  . HYPERGLYCEMIA 12/07/2006  . HYPERLIPIDEMIA 12/07/2006    mixed wth an atherogenic dyslipidemic pattern  . HYPERTENSION 12/07/2006  . OBESITY 11/10/2009  . THROMBOCYTOPENIA 12/07/2006  . TRANSAMINASES, SERUM, ELEVATED 12/07/2006  . SLEEP APNEA 10/22/2007    uses bipap  . 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 Claiborne Cordova  . Diabetes mellitus without complication   . Renal insufficiency     Cr to 3 on ACE  . Nephrolithiasis   . Normal coronary arteries 04/06/2007    by cath EF 55%., last ECHO 10/13/10 EF>55% mild MR, last nuc 06/16/10 EF 57% low risk scan  . Pulmonary HTN     RV syst.  40-48mHg- moderate by echo  . Palpitations     tachy  . PAF (paroxysmal atrial fibrillation) 04/09/2013    Past Surgical History  Procedure Laterality Date  . Cholecystectomy    . Rotator cuff repair    . Cystoscopy/retrograde/ureteroscopy  08/19/2011    Procedure: CYSTOSCOPY/RETROGRADE/URETEROSCOPY;  Surgeon: MHanley Ben MD;  [facility]  Service: Urology;  Laterality: Right;  JJ STENT PLACEMENT   . Cardiac catheterization  04/06/2007    normal cors    Allergies  Allergen Reactions  . Lisinopril     Renal insufficiency    Current Outpatient Prescriptions  Medication Sig Dispense Refill  . apixaban  (ELIQUIS) 5 MG TABS tablet Take 1 tablet (5 mg total) by mouth 2 (two) times daily.  28 tablet  0  . atorvastatin (LIPITOR) 10 MG tablet Take 10 mg by mouth daily.      . carvedilol (COREG) 25 MG tablet Take 2 tablets (50 mg total) by mouth 2 (two) times daily with a meal.  120 tablet  11  . cetirizine (ZYRTEC) 10 MG tablet Take 10 mg by mouth daily.      . Cholecalciferol (VITAMIN D3) 1000 UNITS CAPS Take 2,000 Units by mouth daily.       .Marland Kitchendiltiazem (CARDIZEM CD) 120 MG 24 hr capsule Take at night.  30 capsule  6  . furosemide (LASIX) 20 MG tablet Take 20 mg by mouth daily  as needed for edema. Swelling      . metFORMIN (GLUCOPHAGE-XR) 500 MG 24 hr tablet Take 500 mg by mouth 2 (two) times daily.      . Multiple Vitamin (MULTIVITAMIN) tablet Take 1 tablet by mouth daily.        . niacin (NIASPAN) 1000 MG CR tablet TAKE 1 TABLET BY MOUTH AT BEDTIME  30 tablet  6  . NON FORMULARY BIPAP therapy      . omeprazole (PRILOSEC) 20 MG capsule TAKE ONE CAPSULE BY MOUTH DAILY  30 capsule  5  . potassium citrate (UROCIT-K) 10 MEQ (1080 MG) SR tablet Take 10 mEq by mouth 3 (three) times daily with meals.       . propafenone (RYTHMOL) 150 MG tablet Take 1 tablet (150 mg total) by mouth every 8 (eight) hours.  60 tablet  6   No current facility-administered medications for this visit.    Socially he is married has 4 children and 2 grandchildren. He is a distant relative to the "[place] brothers." He does try to walk and exercise. There is no tobacco use. He does drink alcohol to  ROS General: Negative; No fevers, chills, or night sweats;  HEENT: Negative; No changes in vision or hearing, sinus congestion, difficulty swallowing Pulmonary: Negative; No cough, wheezing, shortness of breath, hemoptysis Cardiovascular: Negative; No chest pain, presyncope, syncope, palpatations No recent peripheral edema GI: Negative; No nausea, vomiting, diarrhea, or abdominal pain GU: Renal function has stabilized; No  dysuria, hematuria, or difficulty voiding; some difficulty with erectile function Musculoskeletal: Negative; no myalgias, joint pain, or weakness Hematologic/Oncology: Negative; no easy bruising, bleeding Endocrine: Negative; no heat/cold intolerance; no diabetes Neuro: Negative; no changes in balance, headaches Skin: Negative; No rashes or skin lesions Psychiatric: Negative; No behavioral problems, depression Sleep: He is using his BiPAP therapy with 100% compliance.  No snoring, daytime sleepiness, hypersomnolence, bruxism, restless legs, hypnogognic hallucinations, no cataplexy Other comprehensive 14 point system review is negative.  PE BP 191/89  Pulse 69  Ht 6' (1.829 m)  Wt 257 lb 4.8 oz (116.62 kg)  BMI 34.8 kg/m2  Repeat blood pressure by me was 168/88 He has lost 8 pounds over the past several months. General: Alert, oriented, no distress.  HEENT: Normocephalic, atraumatic. Pupils round and reactive; sclera anicteric; no xanthelasmas. Extraocular muscles are full. There is no lid lag. Nose without nasal septal hypertrophy Mouth/Parynx benign; Mallinpatti scale 3  Neck: Thick, no JVD, no carotid bruits; normal carotid upstroke Lungs: clear to ausculatation and percussion; no wheezing or rales Chest wall: No tenderness to palpation Heart: PMI not displaced.  Heart rate regular at approximately 60 beats per minute; No ectopy, s1 s2 normal 1/6 sem; no ectopy heard on auscultation Abdomen: Moderate central adiposity ;soft, nontender; no hepatosplenomehaly, BS+; abdominal aorta nontender and not dilated by palpation. Back: No CVA tenderness Pulses 2+ Extremities: Resolution of prior bilateral ankle swelling, Homan's sign negative  Neurologic: grossly nonfocal; cranial nerves intact. Psychological: Normal affect and mood  11/18/2013 ECG (independently read by me): Sinus bradycardia 58 beats per minute.  PR interval 198 ms; QTc interval 371 ms.  Nonspecific ST changes.  07/29/2013  ECG  (independently read by me): Atrial flutter with 2:1 block with a ventricular rate of 87 beats per minute. Atrial rate is approximately 360 ms. 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 fib flutter now with right bundle branch block and repolarization changes.  Prior ECG of  06/04/2013: EKG  suggests probable atrial flutter with a ventricular rate of 105 beats per minute. There also are frequent PVCs. QTc interval is 436 ms. They're nonspecific T changes.  LABS:  BMET    Component Value Date/Time   NA 144 04/30/2013 0923   K 4.6 04/30/2013 0923   CL 105 04/30/2013 0923   CO2 27 04/30/2013 0923   GLUCOSE 128* 04/30/2013 0923   BUN 21 04/30/2013 0923   CREATININE 1.58* 04/30/2013 0923   CREATININE 1.6* 04/12/2013 0825   CALCIUM 9.6 04/30/2013 0923   CALCIUM 9.7 07/03/2012 0942   GFRNONAA 46.00 02/02/2010 0808   GFRAA  Value: 45        The eGFR has been calculated using the MDRD equation. This calculation has not been validated in all clinical situations. eGFR's persistently <60 mL/min signify possible Chronic Kidney Disease.* 09/25/2008 0352     Hepatic Function Panel     Component Value Date/Time   PROT 7.0 04/30/2013 0923   ALBUMIN 4.5 04/30/2013 0923   AST 28 04/30/2013 0923   ALT 45 04/30/2013 0923   ALKPHOS 43 04/30/2013 0923   BILITOT 0.9 04/30/2013 0923   BILIDIR 0.2 04/12/2013 0825   IBILI 1.1* 09/25/2008 0352     CBC    Component Value Date/Time   WBC 4.6 04/30/2013 0923   RBC 4.48 04/30/2013 0923   HGB 13.6 04/30/2013 0923   HCT 40.1 04/30/2013 0923   PLT 95* 04/30/2013 0923   MCV 89.5 04/30/2013 0923   MCH 30.4 04/30/2013 0923   MCHC 33.9 04/30/2013 0923   RDW 14.0 04/30/2013 0923   LYMPHSABS 0.9 07/03/2012 0942   MONOABS 0.4 07/03/2012 0942   EOSABS 0.1 07/03/2012 0942   BASOSABS 0.0 07/03/2012 0942     BNP No results found for this basename: probnp    Lipid Panel     Component  Value Date/Time   CHOL 123 04/12/2013 0825   TRIG 229.0* 04/12/2013 0825   HDL 34.70* 04/12/2013 0825   CHOLHDL 4 04/12/2013 0825   VLDL 45.8* 04/12/2013 0825   LDLCALC 40 12/06/2012 0823     RADIOLOGY: No results found.    ASSESSMENT AND PLAN: Martin Cordova has documented normal coronary arteries by heart catheterization in 2008. He has a history of hypertension, mixed hyperlipidemia, and has paroxysmal atrial fibrillation/flutter.  He has been maintained on carvedilol 50 mg twice a day.  In addition Rhythmol 225 mg every 8 hours.  He underwent successful cardioversion 5 months ago and is maintaining sinus rhythm.  He had held his Eliquis for several days in early August and underwent his colonoscopy without abnormality. With reference to his severe complex obstructive sleep apnea, he is tolerating his new BiPAP well as the machine very well.  He is requiring high pressures.  He's not having significant obstructive events, but still has some very mild hypoxemia. This is an auto unit and he can increase to a maximum BiPAP pressure of 25 cm.  He is on a ramp time of 5 minutes, so that he can increase his pressure fairly quickly to meet his demand.  He is meeting Medicare compliance standards.  Subjectively, he continues to feel significantly improved with therapy.  Prior to initiating treatment, and he often would find himself falling asleep while driving and any time during the day. His blood pressure today continues to be significantly elevated despite taking his current regimen.  I am electing to hydralazine 12.5 mg twice a day to his medical regimen.  Because of his history of renal insufficiency in the past.  I have not started him on ACE or ARB therapy.  He'll monitor his blood pressure.  If blood pressure continues to be elevated, hydralazine, dose will be further titrated.  Troy Sine, MD, [facility]   02/02/2014

## 2014-02-04 ENCOUNTER — Encounter: Payer: Self-pay | Admitting: Gastroenterology

## 2014-02-11 ENCOUNTER — Encounter: Payer: Self-pay | Admitting: Cardiovascular Disease

## 2014-02-17 ENCOUNTER — Other Ambulatory Visit: Payer: Self-pay

## 2014-02-17 MED ORDER — NIACIN ER (ANTIHYPERLIPIDEMIC) 1000 MG PO TBCR: 1000.0000 mg | EXTENDED_RELEASE_TABLET | Freq: Every day | ORAL | Status: DC

## 2014-03-21 ENCOUNTER — Ambulatory Visit: Payer: Medicare Other | Admitting: Cardiovascular Disease

## 2014-03-29 ENCOUNTER — Telehealth: Payer: Self-pay

## 2014-03-29 NOTE — Telephone Encounter (Signed)
LVM for pt to call back.    RE: scheduling AWV for 2015 with NP or PA if pt allows.  

## 2014-04-01 LAB — CBC
HCT: 38.2 % — ABNORMAL LOW (ref 39.0–52.0)
Hemoglobin: 13 g/dL (ref 13.0–17.0)
MCH: 31.1 pg (ref 26.0–34.0)
MCHC: 34 g/dL (ref 30.0–36.0)
MCV: 91.4 fL (ref 78.0–100.0)
Platelets: 102 10*3/uL — ABNORMAL LOW (ref 150–400)
RBC: 4.18 MIL/uL — ABNORMAL LOW (ref 4.22–5.81)
RDW: 14.2 % (ref 11.5–15.5)
WBC: 4.5 10*3/uL (ref 4.0–10.5)

## 2014-04-01 LAB — LIPID PANEL
Cholesterol: 116 mg/dL (ref 0–200)
HDL: 30 mg/dL — ABNORMAL LOW (ref >39)
LDL Cholesterol: 42 mg/dL (ref 0–99)
Total CHOL/HDL Ratio: 3.9 Ratio
Triglycerides: 219 mg/dL — ABNORMAL HIGH (ref <150)
VLDL: 44 mg/dL — ABNORMAL HIGH (ref 0–40)

## 2014-04-01 LAB — COMPREHENSIVE METABOLIC PANEL
ALT: 48 U/L (ref 0–53)
AST: 29 U/L (ref 0–37)
Albumin: 4.3 g/dL (ref 3.5–5.2)
Alkaline Phosphatase: 35 U/L — ABNORMAL LOW (ref 39–117)
BUN: 25 mg/dL — ABNORMAL HIGH (ref 6–23)
CO2: 26 mEq/L (ref 19–32)
Calcium: 9.2 mg/dL (ref 8.4–10.5)
Chloride: 107 mEq/L (ref 96–112)
Creat: 1.79 mg/dL — ABNORMAL HIGH (ref 0.50–1.35)
Glucose, Bld: 126 mg/dL — ABNORMAL HIGH (ref 70–99)
Potassium: 4.7 mEq/L (ref 3.5–5.3)
Sodium: 142 mEq/L (ref 135–145)
Total Bilirubin: 0.9 mg/dL (ref 0.2–1.2)
Total Protein: 6.9 g/dL (ref 6.0–8.3)

## 2014-04-01 LAB — TSH: TSH: 1.738 u[IU]/mL (ref 0.350–4.500)

## 2014-04-02 LAB — HEMOGLOBIN A1C
Hgb A1c MFr Bld: 6.9 % — ABNORMAL HIGH (ref <5.7)
Mean Plasma Glucose: 151 mg/dL — ABNORMAL HIGH (ref <117)

## 2014-04-07 ENCOUNTER — Encounter: Payer: Self-pay | Admitting: Cardiovascular Disease

## 2014-04-07 ENCOUNTER — Ambulatory Visit (INDEPENDENT_AMBULATORY_CARE_PROVIDER_SITE_OTHER): Payer: Medicare Other | Admitting: Cardiovascular Disease

## 2014-04-07 VITALS — BP 150/80 | HR 61 | Ht 72.0 in | Wt 255.2 lb

## 2014-04-07 DIAGNOSIS — I10 Essential (primary) hypertension: Secondary | ICD-10-CM | POA: Insufficient documentation

## 2014-04-07 DIAGNOSIS — E785 Hyperlipidemia, unspecified: Secondary | ICD-10-CM

## 2014-04-07 DIAGNOSIS — I480 Paroxysmal atrial fibrillation: Secondary | ICD-10-CM

## 2014-04-07 DIAGNOSIS — I4892 Unspecified atrial flutter: Secondary | ICD-10-CM

## 2014-04-07 DIAGNOSIS — E1122 Type 2 diabetes mellitus with diabetic chronic kidney disease: Secondary | ICD-10-CM

## 2014-04-07 DIAGNOSIS — N189 Chronic kidney disease, unspecified: Secondary | ICD-10-CM

## 2014-04-07 DIAGNOSIS — E669 Obesity, unspecified: Secondary | ICD-10-CM

## 2014-04-07 MED ORDER — HYDRALAZINE HCL 25 MG PO TABS: 25.0000 mg | ORAL_TABLET | Freq: Three times a day (TID) | ORAL | Status: DC

## 2014-04-07 NOTE — Patient Instructions (Signed)
Your physician has recommended you make the following change in your medication: increase the hydralazine to 25 mg twice daily. Restart your fish oil.  Your physician wants you to follow-up in: 6 months or sooner  If needed. You will receive a reminder letter in the mail two months in advance. If you don't receive a letter, please call our office to schedule the follow-up appointment.

## 2014-04-07 NOTE — Progress Notes (Signed)
Patient ID: [name], male   DOB: 08/15/1940, 73 y.o.   MRN: [id number]       HPI: Martin Cordova is a 73 y.o. male is who presents to the office today for follow-up cardiology  evaluation.  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 In the past, he developed an obstructive uropathy attributed to kidney stones; creatinine had risen up to 3 and ultimately improved and stabilized at approximately 1.7. Earlier this year with his blood pressure being elevated I added amlodipine 5 mg to his medical regimen.  [name] uses his BiPAP daily and does note good sleep. These does complain of ankle swelling. When I l saw him in June I suggested he increase his furosemide and take 20 mg every other day to help control some of his peripheral edema.  His peripheral edema has improved and now he only rarely takes the furosemide.  He had been previously taken off his lisinopril when his creatinine had risen in the setting of his obstructive uropathy. He underwent laboratory in July which showed a total cholesterol 108 triglycerides 183 HDL 31 LDL 40 but LDL particle number was not obtained. Hemoglobin A1c was elevated at 6.8. In January when taken by Dr. Leanne Chang was 6.7. Between January and June he did have nutritional consultation with no improvement in his hemoglobin A1c. Review of the chart indicates that his HbA1c has been in the 6.5 range for almost 3 years.  Earlier this year he had developed recurrent episodes of palpitations and was found to have recurrent paroxysmal atrial fibrillation/flutter.  His medications were adjusted and ultimately he was started on antiarrhythmic therapy with Rythmol to take in addition to his increasing doses of carvedilol.  He was started on Eliquis for anticoagulation.  He  underwent successful cardioversion on 08/05/2013.  Since that time, he is unaware of any recurrent atrial fibrillation.  He has noticed more energy. He has been maintained on 50 mg twice a day of carvedilol in addition to Rythmol 225 mg every 8 hours.  [name] has complex sleep apnea and has been using BiPAP Auto SV at 21/17 with an EPAP name of 17 an EPAP max of 21 and a backup rate at 11 breaths per minute. He has been on CPAP therapy initially for approximately 8 years and has been on BiPAP auto SV for over 4 years. He admits to using his BiPAP with 100% compliance.  He obtained a new BiPAP machine in June.  He feels that his machine is significantly improved from his prior one. When I last saw him I reviewed his  download of his new BiPAP auto SV machine indicates that 90% of the time is EPAP pressure was 17 and 90% of the time his pressure support was 5.8, giving an IPAP of 21.8 cm.  He is using it 100% of the time and is averaging 5 hours and 49 minutes on his most recent download from 12/07/2013 through 01/05/2014.  His average AHI was 7.5.  His device setting maximum BiPAP pressure is 25 cm in maximum EPAP pressure 21 cm.  His average central event index is 1.3, and average obstructive apneic index 0.2, with an average copy index of 6.0.  Average breath.  Rate is 18 breaths per minute with a minute ventilation of 12.1.  Average total  body may 670 mL.  He recently underwent repeat blood work on 04/01/2014.  His BUN was 25, creatinine 1.79, which has been fairly stable over the past year.  TSH was normal at 1.78.  Hemoglobin A1c is now 6.9, which was slightly improved.  Hemoglobin 13, hematocrit 38.2.  His lipid studies were improved with a total cholesterol 116 and LDL cholesterol 42, however, he still had an atherogenic dyslipidemia pattern with elevated triglycerides at 219, elevated VLDL at 44, and low HDL at 30 on his current regimen of atorvastatin 10 mg, Niaspan 1000 mg.  He brought with him  today a log of his BP recording at home.  These have been running in the 130s to the upper 150s with pulses in the 70s.He presents today for evaluation.  .   Past Medical History  Diagnosis Date  . GERD 12/07/2006  . HYPERGLYCEMIA 12/07/2006  . HYPERLIPIDEMIA 12/07/2006    mixed wth an atherogenic dyslipidemic pattern  . HYPERTENSION 12/07/2006  . OBESITY 11/10/2009  . THROMBOCYTOPENIA 12/07/2006  . TRANSAMINASES, SERUM, ELEVATED 12/07/2006  . SLEEP APNEA 10/22/2007    uses bipap  . 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 Tresa Endo  . Diabetes mellitus without complication   . Renal insufficiency     Cr to 3 on ACE  . Nephrolithiasis   . Normal coronary arteries 04/06/2007    by cath EF 55%., last ECHO 10/13/10 EF>55% mild MR, last nuc 06/16/10 EF 57% low risk scan  . Pulmonary HTN     RV syst.  40-53mmHg- moderate by echo  . Palpitations     tachy  . PAF (paroxysmal atrial fibrillation) 04/09/2013    Past Surgical History  Procedure Laterality Date  . Cholecystectomy    . Rotator cuff repair    . Cystoscopy/retrograde/ureteroscopy  08/19/2011    Procedure: CYSTOSCOPY/RETROGRADE/URETEROSCOPY;  Surgeon: Lindaann Slough, MD;  Location: [facility];  Service: Urology;  Laterality: Right;  JJ STENT PLACEMENT   . Cardiac catheterization  04/06/2007    normal cors    Allergies  Allergen Reactions  . Lisinopril     Renal insufficiency    Current Outpatient Prescriptions  Medication Sig Dispense Refill  . apixaban (ELIQUIS) 5 MG TABS tablet Take 1 tablet (5 mg total) by mouth 2 (two) times daily.  28 tablet  0  . atorvastatin (LIPITOR) 10 MG tablet Take 10 mg by mouth daily.      . carvedilol (COREG) 25 MG tablet Take 2 tablets (50 mg total) by mouth 2 (two) times daily with a meal.  120 tablet  11  . cetirizine (ZYRTEC) 10 MG tablet Take 10 mg by mouth daily.      . Cholecalciferol (VITAMIN D3) 1000 UNITS CAPS Take 2,000 Units by mouth daily.       Marland Kitchen diltiazem  (CARDIZEM CD) 120 MG 24 hr capsule Take at night.  30 capsule  6  . furosemide (LASIX) 20 MG tablet Take 20 mg by mouth daily as needed for edema. Swelling      . metFORMIN (GLUCOPHAGE-XR) 500 MG 24 hr tablet Take 500 mg by mouth 2 (two) times daily.      . Multiple Vitamin (MULTIVITAMIN) tablet Take 1 tablet by mouth daily.        . niacin (NIASPAN) 1000 MG CR tablet TAKE 1 TABLET BY MOUTH AT BEDTIME  30 tablet  6  . NON FORMULARY BIPAP therapy      . omeprazole (PRILOSEC) 20 MG  capsule TAKE ONE CAPSULE BY MOUTH DAILY  30 capsule  5  . potassium citrate (UROCIT-K) 10 MEQ (1080 MG) SR tablet Take 10 mEq by mouth 3 (three) times daily with meals.       . propafenone (RYTHMOL) 150 MG tablet Take 1 tablet (150 mg total) by mouth every 8 (eight) hours.  60 tablet  6   No current facility-administered medications for this visit.    Socially he is married has 4 children and 2 grandchildren. He is a distant relative to the "Tirpak brothers." He does try to walk and exercise. There is no tobacco use. He does drink alcohol to  ROS General: Negative; No fevers, chills, or night sweats;  HEENT: Negative; No changes in vision or hearing, sinus congestion, difficulty swallowing Pulmonary: Negative; No cough, wheezing, shortness of breath, hemoptysis Cardiovascular: Negative; No chest pain, presyncope, syncope, palpatations No recent peripheral edema GI: Negative; No nausea, vomiting, diarrhea, or abdominal pain GU: Renal function has stabilized; No dysuria, hematuria, or difficulty voiding; some difficulty with erectile function Musculoskeletal: Negative; no myalgias, joint pain, or weakness Hematologic/Oncology: Negative; no easy bruising, bleeding Endocrine: Negative; no heat/cold intolerance; no diabetes Neuro: Negative; no changes in balance, headaches Skin: Negative; No rashes or skin lesions Psychiatric: Negative; No behavioral problems, depression Sleep: He is using his BiPAP therapy with  100% compliance.  No snoring, daytime sleepiness, hypersomnolence, bruxism, restless legs, hypnogognic hallucinations, no cataplexy Other comprehensive 14 point system review is negative.  PE BP 150/80  Pulse 61  Ht 6' (1.829 m)  Wt 255 lb 4.8 oz (115.758 kg)  BMI 34.6 kg/m2  Repeat blood pressure by me was 168/88 General: Alert, oriented, no distress.  HEENT: Normocephalic, atraumatic. Pupils round and reactive; sclera anicteric; no xanthelasmas. Extraocular muscles are full. There is no lid lag. Nose without nasal septal hypertrophy Mouth/Parynx benign; Mallinpatti scale 3  Neck: Thick, no JVD, no carotid bruits; normal carotid upstroke Lungs: clear to ausculatation and percussion; no wheezing or rales Chest wall: No tenderness to palpation Heart: PMI not displaced.  Heart rate regular at approximately 60 beats per minute; No ectopy, s1 s2 normal 1/6 sem; no ectopy heard on auscultation Abdomen: Moderate central adiposity ;soft, nontender; no hepatosplenomehaly, BS+; abdominal aorta nontender and not dilated by palpation. Back: No CVA tenderness Pulses 2+ Extremities: Resolution of prior bilateral ankle swelling, Homan's sign negative  Neurologic: grossly nonfocal; cranial nerves intact. Psychological: Normal affect and mood  ECG (independently read by me): Normal sinus rhythm at 61.  Nonspecific T abnormality.  QTc interval 394 ms.  11/18/2013 ECG (independently read by me): Sinus bradycardia 58 beats per minute.  PR interval 198 ms; QTc interval 371 ms.  Nonspecific ST changes.  07/29/2013 ECG  (independently read by me): Atrial flutter with 2:1 block with a ventricular rate of 87 beats per minute. Atrial rate is approximately 360 ms. 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 fib flutter now with right bundle branch block and repolarization changes.  Prior ECG of 06/04/2013: EKG  suggests probable atrial flutter  with a ventricular rate of 105 beats per minute. There also are frequent PVCs. QTc interval is 436 ms. They're nonspecific T changes.  LABS:  BMET    Component Value Date/Time   NA 144 04/30/2013 0923   K 4.6 04/30/2013 0923   CL 105 04/30/2013 0923   CO2 27 04/30/2013 0923   GLUCOSE 128* 04/30/2013 0923   BUN 21 04/30/2013  3570   CREATININE 1.58* 04/30/2013 0923   CREATININE 1.6* 04/12/2013 0825   CALCIUM 9.6 04/30/2013 0923   CALCIUM 9.7 07/03/2012 0942   GFRNONAA 46.00 02/02/2010 0808   GFRAA  Value: 45        The eGFR has been calculated using the MDRD equation. This calculation has not been validated in all clinical situations. eGFR's persistently <60 mL/min signify possible Chronic Kidney Disease.* 09/25/2008 0352     Hepatic Function Panel     Component Value Date/Time   PROT 7.0 04/30/2013 0923   ALBUMIN 4.5 04/30/2013 0923   AST 28 04/30/2013 0923   ALT 45 04/30/2013 0923   ALKPHOS 43 04/30/2013 0923   BILITOT 0.9 04/30/2013 0923   BILIDIR 0.2 04/12/2013 0825   IBILI 1.1* 09/25/2008 0352     CBC    Component Value Date/Time   WBC 4.6 04/30/2013 0923   RBC 4.48 04/30/2013 0923   HGB 13.6 04/30/2013 0923   HCT 40.1 04/30/2013 0923   PLT 95* 04/30/2013 0923   MCV 89.5 04/30/2013 0923   MCH 30.4 04/30/2013 0923   MCHC 33.9 04/30/2013 0923   RDW 14.0 04/30/2013 0923   LYMPHSABS 0.9 07/03/2012 0942   MONOABS 0.4 07/03/2012 0942   EOSABS 0.1 07/03/2012 0942   BASOSABS 0.0 07/03/2012 0942     BNP No results found for this basename: probnp    Lipid Panel     Component Value Date/Time   CHOL 123 04/12/2013 0825   TRIG 229.0* 04/12/2013 0825   HDL 34.70* 04/12/2013 0825   CHOLHDL 4 04/12/2013 0825   VLDL 45.8* 04/12/2013 0825   LDLCALC 40 12/06/2012 0823     RADIOLOGY: No results found.    ASSESSMENT AND PLAN: Mr. Balz has documented normal coronary arteries by heart catheterization in 2008. He has a history of hypertension, mixed hyperlipidemia, and has  paroxysmal atrial fibrillation/flutter.  He has been maintained on carvedilol 50 mg twice a day in addition Rhythmol 225 mg every 8 hours.  He underwent successful cardioversion in March 2015 and is maintaining sinus rhythm without any awareness of recurrent arrhythmia.  His ECG today is stable.  QTc interval is normal at 394 ms..  With reference to his severe complex obstructive sleep apnea, he is tolerating his new BiPAP well as the machine very well. He is meeting Medicare compliance standards.  Subjectively, he continues to feel significantly improved with therapy.  Prior to initiating treatment, and he often would find himself falling asleep while driving and any time during the day. He denies any residual daytime sleepiness.  He is unaware of breakthrough snoring. His blood pressure today continues to be mildly elevated despite taking his current regimen.  I am electing to further titrate his hydralazine from 12.5 mg twice a day to 25 mg twice a day.  Because of his history of renal insufficiency in the past I have not started him on ACE or ARB therapy.  He still is mildly obese.  He has lost 4 pounds since his last office visit.  I have recommended additional weight reduction and increased exercise. He is tolerating anticoagulation without bleeding side effects. He is not having any GERD symptoms on his current dose of Prilosec. As long as he remains stable I will see him in 6 months for reevaluation.  Troy Sine, MD, [facility]   02/02/2014

## 2014-04-12 ENCOUNTER — Telehealth: Payer: Self-pay

## 2014-04-12 NOTE — Telephone Encounter (Signed)
Spoke to pt and pt has changed his PCP to [facility].    Pt stated that he has an appt coming up.

## 2014-04-14 ENCOUNTER — Telehealth: Payer: Self-pay | Admitting: Cardiovascular Disease

## 2014-04-14 MED ORDER — HYDRALAZINE HCL 25 MG PO TABS: 25.0000 mg | ORAL_TABLET | Freq: Two times a day (BID) | ORAL | Status: DC

## 2014-04-14 NOTE — Telephone Encounter (Signed)
Pt is calling in wanting to clarify the directions on his Hydralazine prescription . Please call  Thanks

## 2014-04-14 NOTE — Telephone Encounter (Signed)
I spoke with the pt and per Dr Evette Georges office note he wanted the pt to increase Hydralazine to 25mg  twice a day.  The pt just picked up his Rx and it has three times a day.  I made the pt aware that this is incorrect and that he should take the medication twice a day.  I will send a new Rx to the pharmacy.

## 2014-04-22 ENCOUNTER — Encounter: Payer: Self-pay | Admitting: Family Medicine

## 2014-04-24 ENCOUNTER — Telehealth: Payer: Self-pay | Admitting: *Deleted

## 2014-04-24 MED ORDER — OMEGA-3-ACID ETHYL ESTERS 1 G PO CAPS: 2.0000 g | ORAL_CAPSULE | Freq: Every day | ORAL | Status: DC

## 2014-04-24 NOTE — Telephone Encounter (Signed)
-----   Message from Troy Sine, MD sent at 04/20/2014  8:36 PM EST ----- Pt on atorva 10 and niaspan 1000 mg. Neds improved glu control. Start lovaza 2 capsules daily; plt low at 102K

## 2014-04-24 NOTE — Telephone Encounter (Signed)
Called and notified patient of lab results and recommendations. Patient voiced understanding. 

## 2014-05-12 ENCOUNTER — Telehealth: Payer: Self-pay | Admitting: Cardiovascular Disease

## 2014-05-12 NOTE — Telephone Encounter (Signed)
Pt says his heart have been out of rhythm since Friday. He had a cardioversion in [place] been doing fine until now.

## 2014-05-12 NOTE — Telephone Encounter (Signed)
Spoke with Dr. Ellyn Hack (DOD). Since patient reports rate controlled rhythm and is relatively asymptomatic, it would be advisable for him to continue current medications and schedule a f/up with Dr. Claiborne Billings or APP. Dr. Ellyn Hack said another cardioversion could be advised, but should be decided on by patient and provider during in person eval. Dr. Ellyn Hack reports patient is on all appropriate medications should he go back to an abnormal rhythm.   Above information was communicated to patient and he voiced understanding of plan to be scheduled with a provider. He wishes to be scheduled with Dr. Claiborne Billings. I informed him that I would have Dr. Evette Georges scheduler contact him to set up an OV. Patient agreed with plan. He will notify us should he become symptomatic or feel as if his HR is increasing.   Staff message sent to Brunetta Genera, Dr. Evette Georges scheduler, to contact patient for an office visit.

## 2014-05-12 NOTE — Telephone Encounter (Signed)
Patient had cardioversion in March. He reports skipping beats & extra beats since about Friday. He does not feel like his heart is beating fast. He denies chest pain. He c/o some SOB when his heart is skipping beats & have extra beats. He denies lightheadedness. He reports he checked his BP at home on Friday when this first started and his BP is stable per him - around 140s/70s.

## 2014-05-14 ENCOUNTER — Encounter: Payer: Self-pay | Admitting: Physician Assistant

## 2014-05-14 ENCOUNTER — Ambulatory Visit (INDEPENDENT_AMBULATORY_CARE_PROVIDER_SITE_OTHER): Payer: Medicare Other | Admitting: Physician Assistant

## 2014-05-14 VITALS — BP 122/74 | HR 69 | Ht 72.0 in | Wt 256.9 lb

## 2014-05-14 DIAGNOSIS — I480 Paroxysmal atrial fibrillation: Secondary | ICD-10-CM

## 2014-05-14 DIAGNOSIS — R008 Other abnormalities of heart beat: Secondary | ICD-10-CM | POA: Insufficient documentation

## 2014-05-14 DIAGNOSIS — I498 Other specified cardiac arrhythmias: Secondary | ICD-10-CM

## 2014-05-14 NOTE — Assessment & Plan Note (Signed)
The patient is already on 50mg  of coreg BID and propafenone.  I suspect he will resume a normal rhythm at some point.Marland Kitchen  He is basically asymptomatic.  I suppose if the PVCs persist we could try changing his BB.  I reassured him for now.

## 2014-05-14 NOTE — Telephone Encounter (Signed)
OV 12/9 with Gaspar Bidding, PA

## 2014-05-14 NOTE — Telephone Encounter (Signed)
Agree; f/u ov

## 2014-05-14 NOTE — Patient Instructions (Signed)
Your physician wants you to follow-up in: April 2016 You will receive a reminder letter in the mail two months in advance. If you don't receive a letter, please call our office to schedule the follow-up appointment.

## 2014-05-14 NOTE — Progress Notes (Signed)
Patient ID: Martin Cordova, male   DOB: 11-21-1940, 74 y.o.   MRN: [id number]    Date:  05/14/2014   ID:  Martin Cordova, DOB 05-19-1941, MRN [id number]  PCP:  Martin Fraction, MD  Primary Cardiologist:  Martin Cordova     History of Present Illness: Martin Cordova is a 73 y.o. 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  He was just seen by Dr. Claiborne Cordova but returns today because he noticed his HR was skipping every third beat since about Friday last week.  He has also had mild SOB at times but otherwise denies nausea, vomiting, fever, chest pain, orthopnea, dizziness, PND, cough, congestion, abdominal pain, hematochezia, melena, lower extremity edema, claudication.  He has not needed to take any lasix in quite some time.   Wt Readings from Last 3 Encounters:  05/14/14 256 lb 14.4 oz (116.529 kg)  04/07/14 255 lb 3.2 oz (115.758 kg)  01/31/14 257 lb 1.6 oz (116.62 kg)     Past Medical History  Diagnosis Date  . GERD 12/07/2006  . HYPERGLYCEMIA 12/07/2006  . HYPERLIPIDEMIA 12/07/2006    mixed wth an atherogenic dyslipidemic pattern  . HYPERTENSION 12/07/2006  . OBESITY 11/10/2009  . THROMBOCYTOPENIA 12/07/2006  . TRANSAMINASES, SERUM, ELEVATED 12/07/2006  . SLEEP APNEA 10/22/2007    uses bipap  . 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 Claiborne Cordova  . Diabetes mellitus without complication   . Renal insufficiency     Cr to 3 on ACE  . Nephrolithiasis   . Normal coronary arteries 04/06/2007    by cath EF 55%., last ECHO 10/13/10 EF>55% mild MR, last nuc 06/16/10 EF 57% low risk scan  . Pulmonary HTN     RV syst.  40-50mmHg- moderate by echo  . Palpitations     tachy  . PAF (paroxysmal atrial fibrillation) 04/09/2013  . Colon polyps   . Diverticulosis   . Nonalcoholic fatty  liver disease     Current Outpatient Prescriptions  Medication Sig Dispense Refill  . apixaban (ELIQUIS) 5 MG TABS tablet Take 1 tablet (5 mg total) by mouth 2 (two) times daily. 60 tablet 11  . atorvastatin (LIPITOR) 10 MG tablet Take 1 tablet (10 mg total) by mouth at bedtime. 30 tablet 10  . carvedilol (COREG) 25 MG tablet Take 2 tablets (50 mg total) by mouth 2 (two) times daily with a meal. 120 tablet 11  . diphenhydrAMINE (BENADRYL) 25 MG tablet Take 25 mg by mouth at bedtime as needed.    . furosemide (LASIX) 20 MG tablet Take 20 mg by mouth daily as needed for edema. Swelling    . hydrALAZINE (APRESOLINE) 25 MG tablet Take 1 tablet (25 mg total) by mouth 2 (two) times daily. 60 tablet 6  . metFORMIN (GLUMETZA) 1000 MG (MOD) 24 hr tablet Take 1 tablet (1,000 mg total) by mouth 2 (two) times daily with a meal. 60 tablet 11  . niacin (NIASPAN) 1000 MG CR tablet Take 1 tablet (1,000 mg total) by mouth daily. 30 tablet 9  . NON FORMULARY BIPAP therapy    . omega-3 acid ethyl esters (LOVAZA) 1 G capsule Take 2 capsules (2 g total) by mouth daily. 60 capsule 11  . omeprazole (PRILOSEC) 20 MG capsule TAKE ONE CAPSULE BY MOUTH DAILY 30 capsule 11  .  potassium citrate (UROCIT-K) 10 MEQ (1080 MG) SR tablet Take 10 mEq by mouth 3 (three) times daily with meals.     . propafenone (RYTHMOL) 225 MG tablet Take 1 tablet (225 mg total) by mouth every 8 (eight) hours. 90 tablet 10   No current facility-administered medications for this visit.    Allergies:    Allergies  Allergen Reactions  . Lisinopril Swelling    Renal insufficiency    Social History:  The patient  reports that he quit smoking about 33 years ago. His smoking use included Cigarettes. He smoked 0.00 packs per day for 10 years. He quit smokeless tobacco use about 9 years ago. His smokeless tobacco use included Chew. He reports that he drinks alcohol. He reports that he does not use illicit drugs.   Family history:   Family  History  Problem Relation Age of Onset  . Diabetes Mother   . Heart failure Mother   . Stroke Mother   . Kidney failure Father   . Diabetes Father   . Cancer Paternal Uncle     ROS:  Please see the history of present illness.  All other systems reviewed and negative.   PHYSICAL EXAM: VS:  BP 122/74 mmHg  Pulse 69  Ht 6' (1.829 m)  Wt 256 lb 14.4 oz (116.529 kg)  BMI 34.83 kg/m2 Well nourished, well developed, in no acute distress HEENT: Pupils are equal round react to light accommodation extraocular movements are intact.  Neck: no JVDNo cervical lymphadenopathy. Cardiac: Regular rate and rhythm without murmurs rubs or gallops. Lungs:  clear to auscultation bilaterally, no wheezing, rhonchi or rales Ext: no lower extremity edema.  2+ radial and dorsalis pedis pulses. Skin: warm and dry Neuro:  Grossly normal  EKG:  NSR, Trigeminy, 69 bpm    ASSESSMENT AND PLAN:  Problem List Items Addressed This Visit    PAF (paroxysmal atrial fibrillation) - Primary   Relevant Orders      EKG 12-Lead   Trigeminy    The patient is already on 50mg  of coreg BID and propafenone.  I suspect he will resume a normal rhythm at some point.Marland Kitchen  He is basically asymptomatic.  I suppose if the PVCs persist we could try changing his BB.  I reassured him for now.

## 2014-06-02 ENCOUNTER — Ambulatory Visit: Payer: Medicare Other | Admitting: Family Medicine

## 2014-06-09 ENCOUNTER — Other Ambulatory Visit: Payer: Self-pay | Admitting: *Deleted

## 2014-06-09 ENCOUNTER — Telehealth: Payer: Self-pay | Admitting: Cardiovascular Disease

## 2014-06-09 MED ORDER — CARVEDILOL 25 MG PO TABS: 50.0000 mg | ORAL_TABLET | Freq: Two times a day (BID) | ORAL | Status: DC

## 2014-06-09 NOTE — Telephone Encounter (Signed)
Called pharmacy to submit refill.

## 2014-06-09 NOTE — Telephone Encounter (Signed)
Threasa Beards called in wanting to get a verbal order for the pt's carvedilol. Please call  Thanks

## 2014-06-20 ENCOUNTER — Ambulatory Visit (INDEPENDENT_AMBULATORY_CARE_PROVIDER_SITE_OTHER): Payer: Medicare Other | Admitting: Family Medicine

## 2014-06-20 ENCOUNTER — Encounter: Payer: Self-pay | Admitting: Family Medicine

## 2014-06-20 VITALS — BP 126/62 | HR 58 | Temp 97.7°F | Resp 14 | Ht 72.0 in | Wt 257.0 lb

## 2014-06-20 DIAGNOSIS — Z23 Encounter for immunization: Secondary | ICD-10-CM

## 2014-06-20 DIAGNOSIS — E119 Type 2 diabetes mellitus without complications: Secondary | ICD-10-CM

## 2014-06-20 DIAGNOSIS — Z0000 Encounter for general adult medical examination without abnormal findings: Secondary | ICD-10-CM

## 2014-06-20 LAB — MICROALBUMIN, URINE: Microalb, Ur: 7.8 mg/dL — ABNORMAL HIGH (ref <2.0)

## 2014-06-20 MED ORDER — OMEPRAZOLE 20 MG PO CPDR: 20.0000 mg | DELAYED_RELEASE_CAPSULE | Freq: Every day | ORAL | Status: DC

## 2014-06-20 NOTE — Progress Notes (Signed)
Subjective:    Patient ID: Martin Cordova, male    DOB: 12-17-1940, 74 y.o.   MRN: [id number]  HPI  Patient is a very pleasant 74 year old white male here today to establish care and for complete physical exam. Past medical history is significant for atrial fibrillation for which he sees Dr. Claiborne Billings. He is currently anticoagulated on Eliquis. Dr. Claiborne Billings has been managing the majority of his medical problems. Dr. Claiborne Billings checked his hemoglobin A1c in October and found to be acceptable at 6.9.  Patient is currently on metformin. Unfortunately his most recent BMP in October revealed a creatinine of 1.79 making metformin high risk for this individual. Patient is also due for a urine microalbumin. His diabetic eye exam is up-to-date. His diabetic foot exam is performed today. Dr. Claiborne Billings is monitoring and managing his cholesterol. Patient had a colonoscopy in August 2015 which found polyps.  He is due for repeat colonoscopy in 2018. His prostate is monitored by Dr. Janice Norrie. Past Medical History  Diagnosis Date  . GERD 12/07/2006  . HYPERGLYCEMIA 12/07/2006  . HYPERLIPIDEMIA 12/07/2006    mixed wth an atherogenic dyslipidemic pattern  . HYPERTENSION 12/07/2006  . OBESITY 11/10/2009  . THROMBOCYTOPENIA 12/07/2006  . TRANSAMINASES, SERUM, ELEVATED 12/07/2006  . SLEEP APNEA 10/22/2007    uses bipap  . 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 Claiborne Billings  . Diabetes mellitus without complication   . Renal insufficiency     Cr to 3 on ACE  . Nephrolithiasis   . Normal coronary arteries 04/06/2007    by cath EF 55%., last ECHO 10/13/10 EF>55% mild MR, last nuc 06/16/10 EF 57% low risk scan  . Pulmonary HTN     RV syst.  40-45mmHg- moderate by echo  . Palpitations     tachy  . PAF (paroxysmal atrial fibrillation) 04/09/2013  . Colon polyps   . Diverticulosis   . Nonalcoholic fatty liver disease    Past Surgical History  Procedure Laterality Date  . Cholecystectomy    . Rotator cuff repair Right   .  Cystoscopy/retrograde/ureteroscopy  08/19/2011    Procedure: CYSTOSCOPY/RETROGRADE/URETEROSCOPY;  Surgeon: Hanley Ben, MD;  Location: [facility];  Service: Urology;  Laterality: Right;  JJ STENT PLACEMENT   . Cardiac catheterization  04/06/2007    normal cors  . Cardioversion N/A 08/05/2013    Procedure: CARDIOVERSION;  Surgeon: Troy Sine, MD;  Location: [facility] ENDOSCOPY;  Service: Cardiovascular;  Laterality: N/A;   Current Outpatient Prescriptions on File Prior to Visit  Medication Sig Dispense Refill  . apixaban (ELIQUIS) 5 MG TABS tablet Take 1 tablet (5 mg total) by mouth 2 (two) times daily. 60 tablet 11  . atorvastatin (LIPITOR) 10 MG tablet Take 1 tablet (10 mg total) by mouth at bedtime. 30 tablet 10  . carvedilol (COREG) 25 MG tablet Take 2 tablets (50 mg total) by mouth 2 (two) times daily with a meal. 120 tablet 11  . diphenhydrAMINE (BENADRYL) 25 MG tablet Take 25 mg by mouth at bedtime as needed.    . furosemide (LASIX) 20 MG tablet Take 20 mg by mouth daily as needed for edema. Swelling    . hydrALAZINE (APRESOLINE) 25 MG tablet Take 1 tablet (25 mg total) by mouth 2 (two) times daily. 60 tablet 6  . metFORMIN (GLUMETZA) 1000 MG (MOD) 24 hr tablet Take 1 tablet (1,000 mg total) by mouth 2 (two) times daily with a meal. 60 tablet 11  . niacin (NIASPAN) 1000  MG CR tablet Take 1 tablet (1,000 mg total) by mouth daily. 30 tablet 9  . NON FORMULARY BIPAP therapy    . omega-3 acid ethyl esters (LOVAZA) 1 G capsule Take 2 capsules (2 g total) by mouth daily. 60 capsule 11  . potassium citrate (UROCIT-K) 10 MEQ (1080 MG) SR tablet Take 10 mEq by mouth 3 (three) times daily with meals.     . propafenone (RYTHMOL) 225 MG tablet Take 1 tablet (225 mg total) by mouth every 8 (eight) hours. 90 tablet 10   No current facility-administered medications on file prior to visit.   Allergies  Allergen Reactions  . Lisinopril Swelling    Renal insufficiency   History    Social History  . Marital Status: Married    Spouse Name: N/A    Number of Children: 4  . Years of Education: N/A   Occupational History  . retired Pharmacist, hospital    Social History Main Topics  . Smoking status: Former Smoker -- 10 years    Types: Cigarettes    Quit date: 08/04/1980  . Smokeless tobacco: Former Systems developer    Types: Chew    Quit date: 08/08/2004  . Alcohol Use: Yes     Comment: 1 beer 2-3 times per month  . Drug Use: No  . Sexual Activity: Yes   Other Topics Concern  . Not on file   Social History Narrative   Family History  Problem Relation Age of Onset  . Diabetes Mother   . Heart failure Mother   . Stroke Mother   . Kidney failure Father   . Diabetes Father   . Cancer Paternal Uncle      Review of Systems  All other systems reviewed and are negative.      Objective:   Physical Exam  Constitutional: He is oriented to person, place, and time. He appears well-developed and well-nourished. No distress.  HENT:  Head: Normocephalic and atraumatic.  Right Ear: External ear normal.  Left Ear: External ear normal.  Nose: Nose normal.  Mouth/Throat: Oropharynx is clear and moist. No oropharyngeal exudate.  Eyes: Conjunctivae and EOM are normal. Pupils are equal, round, and reactive to light. Right eye exhibits no discharge. Left eye exhibits no discharge. No scleral icterus.  Neck: Normal range of motion. Neck supple. No JVD present. No tracheal deviation present. No thyromegaly present.  Cardiovascular: Normal rate, regular rhythm, normal heart sounds and intact distal pulses.  Exam reveals no gallop and no friction rub.   No murmur heard. Pulmonary/Chest: Effort normal and breath sounds normal. No stridor. No respiratory distress. He has no wheezes. He has no rales. He exhibits no tenderness.  Abdominal: Soft. Bowel sounds are normal. He exhibits no distension and no mass. There is no tenderness. There is no rebound and no guarding.  Musculoskeletal: Normal  range of motion. He exhibits no edema or tenderness.  Lymphadenopathy:    He has no cervical adenopathy.  Neurological: He is alert and oriented to person, place, and time. He has normal reflexes. He displays normal reflexes. No cranial nerve deficit. He exhibits normal muscle tone. Coordination normal.  Skin: Skin is warm. No rash noted. He is not diaphoretic. No erythema. No pallor.  Psychiatric: He has a normal mood and affect. His behavior is normal. Judgment and thought content normal.  Vitals reviewed.         Assessment & Plan:  Routine general medical examination at a health care facility - Plan: Pneumococcal conjugate vaccine  13-valent IM  Diabetes mellitus type II, controlled - Plan: Microalbumin, urine, Pneumococcal conjugate vaccine 13-valent IM  Need for prophylactic vaccination against Streptococcus pneumoniae (pneumococcus) - Plan: Pneumococcal conjugate vaccine 13-valent IM  Physical exam today is normal except for the patient's weight. I recommended diet exercise and weight loss. Diabetic eye exam and foot exam are normal. Due to the patient's thrombocytopenia would not recommend an aspirin for secondary prevention of cardiovascular disease even in a diabetic patient. I did recommend checking a urine microalbumin today particular given his chronic kidney disease. I did recommend discontinuing metformin and replacing with Tradjenta 5 mg a day. The patient would like to discuss this with Dr. Claiborne Billings and his daughter first. Patient did receive Prevnar 52 today in office. Pneumovax 23, Zostavax, and his flu shot are up-to-date.

## 2014-06-24 ENCOUNTER — Telehealth: Payer: Self-pay | Admitting: Family Medicine

## 2014-06-24 DIAGNOSIS — IMO0002 Reserved for concepts with insufficient information to code with codable children: Secondary | ICD-10-CM

## 2014-06-24 DIAGNOSIS — E1165 Type 2 diabetes mellitus with hyperglycemia: Secondary | ICD-10-CM

## 2014-06-24 MED ORDER — LINAGLIPTIN 5 MG PO TABS: 5.0000 mg | ORAL_TABLET | Freq: Every day | ORAL | Status: DC

## 2014-06-24 NOTE — Telephone Encounter (Signed)
-----   Message from Susy Frizzle, MD sent at 06/23/2014  7:04 AM EST ----- Urine test shows kidney damage from diabetes.  What happened when he took lisinopril in the past?  If just a cough, I would suggest losartan 25 mg poqday  and repeat BMP in  1 month to monitor kidney function closely.  I would definitely recommend DC metformin and replace with tradjenta 5 mg poqday.

## 2014-06-24 NOTE — Telephone Encounter (Signed)
Pt aware of provider recommendations.  Has made 3 mth diabetic follow up

## 2014-06-24 NOTE — Telephone Encounter (Signed)
Pt aware of lab result.  Pt states he had swelling with Lisinopril.  Please advise about Losartan??  Have discontinued Metformin and sent RX for Tradjenta.

## 2014-06-24 NOTE — Telephone Encounter (Signed)
Hold off on losartan because it may cause swelling as well.  No other changes at this time.

## 2014-07-07 ENCOUNTER — Telehealth: Payer: Self-pay | Admitting: Cardiovascular Disease

## 2014-07-07 NOTE — Telephone Encounter (Signed)
Pt says he need prior authorization for his Eliquis.Please call his insurance company [employer].

## 2014-07-14 ENCOUNTER — Telehealth: Payer: Self-pay | Admitting: Cardiovascular Disease

## 2014-07-14 DIAGNOSIS — I10 Essential (primary) hypertension: Secondary | ICD-10-CM

## 2014-07-14 DIAGNOSIS — E785 Hyperlipidemia, unspecified: Secondary | ICD-10-CM

## 2014-07-14 DIAGNOSIS — I480 Paroxysmal atrial fibrillation: Secondary | ICD-10-CM

## 2014-07-14 NOTE — Telephone Encounter (Signed)
Pt called in stating that he will need some lab orders put in before his appt with Dr.Kelly on 5/2. Please inform pt of when those have been placed.   thanks

## 2014-07-14 NOTE — Telephone Encounter (Signed)
Returned call to patient he stated he would like fasting lab before his appointment with [facility] in May.Lab orders mailed to patient.

## 2014-07-22 NOTE — Telephone Encounter (Signed)
Telephoned [employer] and got a PA approval for Eliquis 5 mg to take twice daily.   PA # [id number] good through 07/23/15. Patient and [facility] notified.

## 2014-09-12 ENCOUNTER — Telehealth: Payer: Self-pay | Admitting: Cardiovascular Disease

## 2014-09-12 NOTE — Telephone Encounter (Signed)
Left message for patient to call back  

## 2014-09-12 NOTE — Telephone Encounter (Signed)
Pt states on rhythmol TID, instructed by Dr. Claiborne Billings to watch if HR got lower.  Took over last several days, BP has held steady (120s/70s)  HR 34 this AM, 61 after getting up. Getting dizzy, sluggish. Notices symptoms when HR low only. NOS.   Pt callback [phone]  Pt on carvedilol & propafenone at prescribed doses.    Pt notes he lost 20lbs over last 8 weeks, (intentional), wondered if weight change may have to do w/ medication effects.  Pt informed will route to Dr. Claiborne Billings to advise.

## 2014-09-12 NOTE — Telephone Encounter (Signed)
Pt heart rate is lower and he has a little dizziness.Pt thinks his medicine might need to be adjusted.

## 2014-09-15 NOTE — Telephone Encounter (Signed)
Discussed w/ [employer] Ellyn Hack) this AM. On Friday, I had advised to cut propafenone to BID, monitor for changes. Dr. Ellyn Hack agreed w/ plan. He has f/u in 1 week. Pt voiced understanding.

## 2014-09-23 ENCOUNTER — Encounter: Payer: Self-pay | Admitting: Family Medicine

## 2014-09-23 ENCOUNTER — Ambulatory Visit (INDEPENDENT_AMBULATORY_CARE_PROVIDER_SITE_OTHER): Payer: Medicare Other | Admitting: Family Medicine

## 2014-09-23 VITALS — BP 136/86 | HR 64 | Temp 97.7°F | Resp 18 | Wt 234.0 lb

## 2014-09-23 DIAGNOSIS — E785 Hyperlipidemia, unspecified: Secondary | ICD-10-CM

## 2014-09-23 DIAGNOSIS — E119 Type 2 diabetes mellitus without complications: Secondary | ICD-10-CM | POA: Diagnosis not present

## 2014-09-23 DIAGNOSIS — I480 Paroxysmal atrial fibrillation: Secondary | ICD-10-CM | POA: Diagnosis not present

## 2014-09-23 LAB — CBC WITH DIFFERENTIAL/PLATELET
Basophils Absolute: 0 10*3/uL (ref 0.0–0.1)
Basophils Relative: 0 % (ref 0–1)
Eosinophils Absolute: 0.1 10*3/uL (ref 0.0–0.7)
Eosinophils Relative: 2 % (ref 0–5)
HCT: 41.7 % (ref 39.0–52.0)
Hemoglobin: 13.7 g/dL (ref 13.0–17.0)
Lymphocytes Relative: 25 % (ref 12–46)
Lymphs Abs: 1 10*3/uL (ref 0.7–4.0)
MCH: 30.3 pg (ref 26.0–34.0)
MCHC: 32.9 g/dL (ref 30.0–36.0)
MCV: 92.3 fL (ref 78.0–100.0)
MPV: 10.8 fL (ref 8.6–12.4)
Monocytes Absolute: 0.3 10*3/uL (ref 0.1–1.0)
Monocytes Relative: 8 % (ref 3–12)
Neutro Abs: 2.6 10*3/uL (ref 1.7–7.7)
Neutrophils Relative %: 65 % (ref 43–77)
Platelets: 91 10*3/uL — ABNORMAL LOW (ref 150–400)
RBC: 4.52 MIL/uL (ref 4.22–5.81)
RDW: 14.2 % (ref 11.5–15.5)
WBC: 4 10*3/uL (ref 4.0–10.5)

## 2014-09-23 LAB — LIPID PANEL
Cholesterol: 109 mg/dL (ref 0–200)
HDL: 33 mg/dL — ABNORMAL LOW (ref >=40)
LDL Cholesterol: 48 mg/dL (ref 0–99)
Total CHOL/HDL Ratio: 3.3 Ratio
Triglycerides: 138 mg/dL (ref <150)
VLDL: 28 mg/dL (ref 0–40)

## 2014-09-23 LAB — COMPLETE METABOLIC PANEL WITH GFR
ALT: 24 U/L (ref 0–53)
AST: 19 U/L (ref 0–37)
Albumin: 4.2 g/dL (ref 3.5–5.2)
Alkaline Phosphatase: 39 U/L (ref 39–117)
BUN: 35 mg/dL — ABNORMAL HIGH (ref 6–23)
CO2: 24 mEq/L (ref 19–32)
Calcium: 9.5 mg/dL (ref 8.4–10.5)
Chloride: 106 mEq/L (ref 96–112)
Creat: 2.09 mg/dL — ABNORMAL HIGH (ref 0.50–1.35)
GFR, Est African American: 35 mL/min — ABNORMAL LOW
GFR, Est Non African American: 31 mL/min — ABNORMAL LOW
Glucose, Bld: 96 mg/dL (ref 70–99)
Potassium: 4.8 mEq/L (ref 3.5–5.3)
Sodium: 140 mEq/L (ref 135–145)
Total Bilirubin: 0.7 mg/dL (ref 0.2–1.2)
Total Protein: 7 g/dL (ref 6.0–8.3)

## 2014-09-23 LAB — HEMOGLOBIN A1C
Hgb A1c MFr Bld: 5.8 % — ABNORMAL HIGH (ref <5.7)
Mean Plasma Glucose: 120 mg/dL — ABNORMAL HIGH (ref <117)

## 2014-09-23 NOTE — Progress Notes (Signed)
Subjective:    Patient ID: Martin Cordova, male    DOB: 1941/05/29, 74 y.o.   MRN: [id number]  HPI She is a very pleasant 74 year old white male who is here today for follow-up. I am extremely proud of this patient. Both he and his wife have been on Weight Watchers. He has intentionally lost almost 20 pounds. He denies any symptoms of hypoglycemia. He denies any history of polyuria, polydipsia, or blurred vision. He is due today to check a hemoglobin A1c. 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  Patient's blood pressure today is borderline at 136/86. He denies any chest pain shortness of breath or dyspnea on exertion. He denies any myalgias or right upper quadrant pain on his statin medication. He is interested in possibly discontinuing some of his medication and that is why he is been working diligently to lose weight. Pneumonia vaccination is up-to-date. Diabetic foot exam is performed today and is normal outside of some dry flaky skin. Patient has an annual eye exam performed at his ophthalmologist.  Past Medical History  Diagnosis Date  . GERD 12/07/2006  . HYPERGLYCEMIA 12/07/2006  . HYPERLIPIDEMIA 12/07/2006    mixed wth an atherogenic dyslipidemic pattern  . HYPERTENSION 12/07/2006  . OBESITY 11/10/2009  . THROMBOCYTOPENIA 12/07/2006  . TRANSAMINASES, SERUM, ELEVATED 12/07/2006  . SLEEP APNEA 10/22/2007    uses bipap  . 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 Claiborne Billings  . Diabetes mellitus without complication   . Renal insufficiency     Cr to 3 on ACE  . Nephrolithiasis   . Normal coronary arteries 04/06/2007    by cath EF 55%., last ECHO 10/13/10 EF>55% mild MR, last nuc 06/16/10 EF 57% low risk scan  . Pulmonary HTN     RV syst.  40-47mmHg- moderate by echo  . Palpitations     tachy  . PAF (paroxysmal atrial fibrillation) 04/09/2013  . Colon polyps   . Diverticulosis   . Nonalcoholic fatty liver disease    Past Surgical History  Procedure Laterality Date  .  Cholecystectomy    . Rotator cuff repair Right   . Cystoscopy/retrograde/ureteroscopy  08/19/2011    Procedure: CYSTOSCOPY/RETROGRADE/URETEROSCOPY;  Surgeon: Hanley Ben, MD;  Location: [facility];  Service: Urology;  Laterality: Right;  JJ STENT PLACEMENT   . Cardiac catheterization  04/06/2007    normal cors  . Cardioversion N/A 08/05/2013    Procedure: CARDIOVERSION;  Surgeon: Troy Sine, MD;  Location: [facility] ENDOSCOPY;  Service: Cardiovascular;  Laterality: N/A;   Current Outpatient Prescriptions on File Prior to Visit  Medication Sig Dispense Refill  . apixaban (ELIQUIS) 5 MG TABS tablet Take 1 tablet (5 mg total) by mouth 2 (two) times daily. 60 tablet 11  . atorvastatin (LIPITOR) 10 MG tablet Take 1 tablet (10 mg total) by mouth at bedtime. 30 tablet 10  . carvedilol (COREG) 25 MG tablet Take 2 tablets (50 mg total) by mouth 2 (two) times daily with a meal. 120 tablet 11  . diphenhydrAMINE (BENADRYL) 25 MG tablet Take 25 mg by mouth at bedtime as needed.    . furosemide (LASIX) 20 MG tablet Take 20 mg by mouth daily as needed for edema. Swelling    . hydrALAZINE (APRESOLINE) 25 MG tablet Take 1 tablet (25 mg total) by mouth 2 (two) times daily. 60 tablet 6  . linagliptin (TRADJENTA) 5 MG TABS tablet Take 1 tablet (5 mg total) by mouth daily. 30 tablet 5  .  niacin (NIASPAN) 1000 MG CR tablet Take 1 tablet (1,000 mg total) by mouth daily. 30 tablet 9  . NON FORMULARY BIPAP therapy    . omega-3 acid ethyl esters (LOVAZA) 1 G capsule Take 2 capsules (2 g total) by mouth daily. 60 capsule 11  . omeprazole (PRILOSEC) 20 MG capsule Take 1 capsule (20 mg total) by mouth daily. 30 capsule 11  . potassium citrate (UROCIT-K) 10 MEQ (1080 MG) SR tablet Take 10 mEq by mouth 3 (three) times daily with meals.     . propafenone (RYTHMOL) 225 MG tablet Take 1 tablet (225 mg total) by mouth every 8 (eight) hours. 90 tablet 10   No current facility-administered medications on file  prior to visit.   Allergies  Allergen Reactions  . Lisinopril Swelling    Renal insufficiency   History   Social History  . Marital Status: Married    Spouse Name: N/A  . Number of Children: 4  . Years of Education: N/A   Occupational History  . retired Pharmacist, hospital    Social History Main Topics  . Smoking status: Former Smoker -- 10 years    Types: Cigarettes    Quit date: 08/04/1980  . Smokeless tobacco: Former Systems developer    Types: Chew    Quit date: 08/08/2004  . Alcohol Use: Yes     Comment: 1 beer 2-3 times per month  . Drug Use: No  . Sexual Activity: Yes   Other Topics Concern  . Not on file   Social History Narrative    Review of Systems  All other systems reviewed and are negative.      Objective:   Physical Exam  Constitutional: He is oriented to person, place, and time. He appears well-developed and well-nourished. No distress.  Eyes: Conjunctivae and EOM are normal. Pupils are equal, round, and reactive to light.  Neck: Neck supple. No JVD present.  Cardiovascular: Normal rate, regular rhythm and normal heart sounds.   No murmur heard. Pulmonary/Chest: Effort normal and breath sounds normal. No respiratory distress. He has no wheezes. He has no rales.  Abdominal: Soft. Bowel sounds are normal. He exhibits no distension. There is no tenderness. There is no rebound.  Musculoskeletal: He exhibits no edema.  Lymphadenopathy:    He has no cervical adenopathy.  Neurological: He is alert and oriented to person, place, and time. He has normal reflexes.  Skin: He is not diaphoretic.  Vitals reviewed.         Assessment & Plan:  Diabetes mellitus type II, controlled - Plan: CBC with Differential/Platelet, COMPLETE METABOLIC PANEL WITH GFR, Lipid panel, Hemoglobin A1c, Microalbumin, urine  PAF (paroxysmal atrial fibrillation)  Dyslipidemia  Patient's physical exam today is normal. His blood pressure is well controlled. At the present time he denies any  symptoms of uncontrolled diabetes. I will check a hemoglobin A1c. If less than 6.5, I would like to try to take the patient off of his diabetic medication to reduce some of his polypharmacy. I will also check a fasting lipid panel. Goal LDL cholesterol is less than 70. Ideally I would like to see his HDL cholesterol greater than 40. Patient is currently in normal sinus rhythm. He is appropriately anticoagulated on Eliquis. His heart rate is controlled.

## 2014-09-24 LAB — MICROALBUMIN, URINE: Microalb, Ur: 1.7 mg/dL (ref <2.0)

## 2014-09-25 ENCOUNTER — Telehealth: Payer: Self-pay | Admitting: Cardiovascular Disease

## 2014-09-25 NOTE — Telephone Encounter (Signed)
Spoke to patient. He reports dizziness & lightheadedness occasionally when standing.  He took 3 BP reads today  8am  136/72 w 53 HR 10am 126/60 w 36 HR 11:15 am 127/77 w 53 HR  Concerned for overall low HR, seeking Dr. Evette Georges advice.  His propafenone dose was cut from TID to BID recently for same symptoms. He has been compliant w/ this change. No other issues reported.  Will defer to Dr. Claiborne Billings.

## 2014-09-25 NOTE — Telephone Encounter (Signed)
Pt called says he feels lightheaded and low pulse rate.Please call to advise.

## 2014-09-29 NOTE — Telephone Encounter (Signed)
Decrease coreg to 25 mg bid,

## 2014-09-29 NOTE — Telephone Encounter (Signed)
Pt advised on Dr. Evette Georges instructions - voiced understanding.

## 2014-09-30 LAB — LIPID PANEL
Cholesterol: 94 mg/dL (ref 0–200)
HDL: 34 mg/dL — ABNORMAL LOW (ref >=40)
LDL Cholesterol: 34 mg/dL (ref 0–99)
Total CHOL/HDL Ratio: 2.8 Ratio
Triglycerides: 128 mg/dL (ref <150)
VLDL: 26 mg/dL (ref 0–40)

## 2014-09-30 LAB — COMPREHENSIVE METABOLIC PANEL
ALT: 23 U/L (ref 0–53)
AST: 18 U/L (ref 0–37)
Albumin: 4.1 g/dL (ref 3.5–5.2)
Alkaline Phosphatase: 33 U/L — ABNORMAL LOW (ref 39–117)
BUN: 33 mg/dL — ABNORMAL HIGH (ref 6–23)
CO2: 25 mEq/L (ref 19–32)
Calcium: 9.6 mg/dL (ref 8.4–10.5)
Chloride: 106 mEq/L (ref 96–112)
Creat: 2.01 mg/dL — ABNORMAL HIGH (ref 0.50–1.35)
Glucose, Bld: 97 mg/dL (ref 70–99)
Potassium: 4.7 mEq/L (ref 3.5–5.3)
Sodium: 142 mEq/L (ref 135–145)
Total Bilirubin: 1 mg/dL (ref 0.2–1.2)
Total Protein: 7 g/dL (ref 6.0–8.3)

## 2014-09-30 LAB — TSH: TSH: 1.252 u[IU]/mL (ref 0.350–4.500)

## 2014-09-30 LAB — HEMOGLOBIN A1C
Hgb A1c MFr Bld: 6.1 % — ABNORMAL HIGH (ref <5.7)
Mean Plasma Glucose: 128 mg/dL — ABNORMAL HIGH (ref <117)

## 2014-10-06 ENCOUNTER — Ambulatory Visit (INDEPENDENT_AMBULATORY_CARE_PROVIDER_SITE_OTHER): Payer: Medicare Other | Admitting: Cardiovascular Disease

## 2014-10-06 ENCOUNTER — Encounter: Payer: Self-pay | Admitting: Cardiovascular Disease

## 2014-10-06 VITALS — BP 160/90 | Ht 71.0 in | Wt 231.0 lb

## 2014-10-06 DIAGNOSIS — I10 Essential (primary) hypertension: Secondary | ICD-10-CM

## 2014-10-06 DIAGNOSIS — N189 Chronic kidney disease, unspecified: Secondary | ICD-10-CM

## 2014-10-06 DIAGNOSIS — E669 Obesity, unspecified: Secondary | ICD-10-CM

## 2014-10-06 DIAGNOSIS — I480 Paroxysmal atrial fibrillation: Secondary | ICD-10-CM

## 2014-10-06 DIAGNOSIS — E785 Hyperlipidemia, unspecified: Secondary | ICD-10-CM

## 2014-10-06 DIAGNOSIS — E1122 Type 2 diabetes mellitus with diabetic chronic kidney disease: Secondary | ICD-10-CM

## 2014-10-06 DIAGNOSIS — I4892 Unspecified atrial flutter: Secondary | ICD-10-CM | POA: Diagnosis not present

## 2014-10-06 MED ORDER — NIACIN ER (ANTIHYPERLIPIDEMIC) 500 MG PO TBCR: 500.0000 mg | EXTENDED_RELEASE_TABLET | Freq: Every day | ORAL | Status: DC

## 2014-10-06 MED ORDER — NIACIN ER (ANTIHYPERLIPIDEMIC) 1000 MG PO TBCR: 500.0000 mg | EXTENDED_RELEASE_TABLET | Freq: Every day | ORAL | Status: DC

## 2014-10-06 NOTE — Patient Instructions (Signed)
Your physician has recommended you make the following change in your medication: decrease the niaspan to 500 mg daily.  Your physician wants you to follow-up in: 6 months or sooner if needed. You will receive a reminder letter in the mail two months in advance. If you don't receive a letter, please call our office to schedule the follow-up appointment.

## 2014-10-07 ENCOUNTER — Encounter: Payer: Self-pay | Admitting: Cardiovascular Disease

## 2014-10-07 NOTE — Progress Notes (Signed)
Patient ID: EAN GETTEL, male   DOB: 05-03-1941, 74 y.o.   MRN: [id number]       HPI: Martin Cordova is a 74 y.o. male is who presents to the office today for a 7 month follow-up cardiology  evaluation.  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 In the past, he developed an obstructive uropathy attributed to kidney stones; creatinine had risen up to 3 and ultimately improved and stabilized at approximately 1.7. Martin Cordova uses his BiPAP daily and does note good sleep. These does complain of ankle swelling. When I l saw him in last year I suggested he increase his furosemide and take 20 mg every other day to help control some of his peripheral edema.  His peripheral edema  improved .  He's no longer taking furosemide.  He had been previously taken off his lisinopril when his creatinine had risen in the setting of his obstructive uropathy. He underwent laboratory in July which showed a total cholesterol 108 triglycerides 183 HDL 31 LDL 40 but LDL particle number was not obtained. Hemoglobin A1c was elevated at 6.8. In January when taken by Dr. Leanne Chang was 6.7. Between January and June he did have nutritional consultation with no improvement in his hemoglobin A1c. Review of the chart indicates that his HbA1c has been in the 6.5 range for almost 3 years. In 2015 he developed recurrent episodes of palpitations and was found to have recurrent paroxysmal atrial fibrillation/flutter.  His medications were adjusted and  he was started on antiarrhythmic therapy with Rythmol to take in addition to his increasing doses of carvedilol.  He was started on Eliquis for anticoagulation.  He underwent successful cardioversion on 08/05/2013.  Since that time, he is unaware of any recurrent atrial fibrillation.  He has noticed more  energy. He had been maintained on 50 mg twice a day of carvedilol in addition to Rythmol 225 mg every 8 hours.  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 He has been on CPAP therapy initially for approximately 8 years and has been on BiPAP auto SV for over 4 years. He admits to using his BiPAP with 100% compliance.  He obtained a new BiPAP machine in June.  He feels that his machine is significantly improved from his prior one. When I last saw him I reviewed his  download of his new BiPAP auto SV machine indicates that 90% of the time is EPAP pressure was 17 and 90% of the time his pressure support was 5.8, giving an IPAP of 21.8 cm.  He is using it 100% of the time and is averaging 5 hours and 49 minutes on his most recent download from 12/07/2013 through 01/05/2014.  His average AHI was 7.5.  His device setting maximum BiPAP pressure is 25 cm in maximum EPAP pressure 21 cm.  His average central event index is 1.3, and average obstructive apneic index 0.2, with an average copy index of 6.0.  Average breath.  Rate is 18 breaths per minute with a minute ventilation of 12.1.  Average total body may 670 mL.  Laboratory on 04/01/2014 revealed BUN was 25, creatinine 1.79, which has been fairly stable over the past year.  TSH was normal at 1.78.  Hemoglobin A1c is now 6.9, which was slightly improved.  Hemoglobin 13, hematocrit 38.2.  His lipid studies were improved with a total cholesterol 116 and LDL cholesterol 42, however, he still had an atherogenic dyslipidemia pattern with elevated triglycerides at 219, elevated VLDL at 44, and low HDL at 30 on his current regimen of atorvastatin 10 mg, Niaspan 1000 mg.  Since I last saw him in November 2015, he feels his dizziness is better reduced dose of Coreg and a reduced dose of Rythmol which we had called in when he complained of these symptoms.   He is now taking Rythmol 225 mg twice a day instead of 3 times a day and Coreg 25 mg twice a day.  He continues to be on atorvastatin 10 mg, niacin 1000 mg and low vase at 2 capsules daily for his mixed hyperlipidemia.  I reviewed her recent laboratory from 7 days ago.  His total cholesterol was 94, triglycerides 128 (significantly improved from remotely).  HDL had risen to 34, LDL is 34, but VLDL normal at 26.Marland Kitchen   Past Medical History  Diagnosis Date  . GERD 12/07/2006  . HYPERGLYCEMIA 12/07/2006  . HYPERLIPIDEMIA 12/07/2006    mixed wth an atherogenic dyslipidemic pattern  . HYPERTENSION 12/07/2006  . OBESITY 11/10/2009  . THROMBOCYTOPENIA 12/07/2006  . TRANSAMINASES, SERUM, ELEVATED 12/07/2006  . SLEEP APNEA 10/22/2007    uses bipap  . 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 Claiborne Billings  . Diabetes mellitus without complication   . Renal insufficiency     Cr to 3 on ACE  . Nephrolithiasis   . Normal coronary arteries 04/06/2007    by cath EF 55%., last ECHO 10/13/10 EF>55% mild MR, last nuc 06/16/10 EF 57% low risk scan  . Pulmonary HTN     RV syst.  40-57mHg- moderate by echo  . Palpitations     tachy  . PAF (paroxysmal atrial fibrillation) 04/09/2013    Past Surgical History  Procedure Laterality Date  . Cholecystectomy    . Rotator cuff repair    . Cystoscopy/retrograde/ureteroscopy  08/19/2011    Procedure: CYSTOSCOPY/RETROGRADE/URETEROSCOPY;  Surgeon: MHanley Ben MD;  [facility]  Service: Urology;  Laterality: Right;  JJ STENT PLACEMENT   . Cardiac catheterization  04/06/2007    normal cors    Allergies  Allergen Reactions  . Lisinopril     Renal insufficiency    Current outpatient prescriptions:  .  apixaban (ELIQUIS) 5 MG TABS tablet, Take 1 tablet (5 mg total) by mouth 2 (two) times daily., Disp: 60 tablet, Rfl: 11 .  atorvastatin (LIPITOR) 10 MG tablet, Take 1 tablet (10 mg total) by mouth at bedtime., Disp: 30 tablet, Rfl: 10 .   carvedilol (COREG) 25 MG tablet, Take 25 mg by mouth 2 (two) times daily with a meal., Disp: , Rfl:  .  hydrALAZINE (APRESOLINE) 25 MG tablet, Take 1 tablet (25 mg total) by mouth 2 (two) times daily., Disp: 60 tablet, Rfl: 6 .  linagliptin (TRADJENTA) 5 MG TABS tablet, Take 1 tablet (5 mg total) by mouth daily., Disp: 30 tablet, Rfl: 5 .  niacin (NIASPAN) 500 MG CR tablet, Take 1 tablet (500 mg total) by mouth at bedtime., Disp: 30 tablet, Rfl: 6 .  NON FORMULARY, BIPAP therapy, Disp: , Rfl:  .  omega-3 acid ethyl esters (LOVAZA) 1 G capsule, Take 2 capsules (2 g total) by mouth daily., Disp: 60 capsule, Rfl: 11 .  omeprazole (PRILOSEC) 20 MG capsule, Take 1 capsule (20 mg total) by mouth daily., Disp: 30 capsule, Rfl: 11 .  potassium citrate (UROCIT-K) 10 MEQ (1080 MG) SR tablet, Take 10 mEq by mouth 3 (three) times daily with meals. , Disp: , Rfl:  .  propafenone (RYTHMOL) 225 MG tablet, Take 1 tablet (225 mg total) by mouth every 8 (eight) hours. (Patient taking differently: Take 225 mg by mouth 2 (two) times daily. ), Disp: 90 tablet, Rfl: 10    Socially he is married has 4 children and 2 grandchildren. He is a distant relative to the "Goncalves brothers." He does try to walk and exercise. There is no tobacco use. He does drink alcohol to  ROS General: Negative; No fevers, chills, or night sweats;  HEENT: Negative; No changes in vision or hearing, sinus congestion, difficulty swallowing Pulmonary: Negative; No cough, wheezing, shortness of breath, hemoptysis Cardiovascular: Negative; No chest pain, presyncope, syncope, palpatations No recent peripheral edema GI: Negative; No nausea, vomiting, diarrhea, or abdominal pain GU: Renal function has stabilized; No dysuria, hematuria, or difficulty voiding; some difficulty with erectile function Musculoskeletal: Negative; no myalgias, joint pain, or weakness Hematologic/Oncology: Negative; no easy bruising, bleeding Endocrine: Negative; no  heat/cold intolerance; no diabetes Neuro: Negative; no changes in balance, headaches Skin: Negative; No rashes or skin lesions Psychiatric: Negative; No behavioral problems, depression Sleep: He is using his BiPAP therapy with 100% compliance.  No snoring, daytime sleepiness, hypersomnolence, bruxism, restless legs, hypnogognic hallucinations, no cataplexy Other comprehensive 14 point system review is negative.  PE BP 150/80  Pulse 61  Ht 6' (1.829 m)  Wt 255 lb 4.8 oz (115.758 kg)  BMI 34.6 kg/m2  Repeat blood pressure by me was 168/88 General: Alert, oriented, no distress.  HEENT: Normocephalic, atraumatic. Pupils round and reactive; sclera anicteric; no xanthelasmas. Extraocular muscles are full. There is no lid lag. Nose without nasal septal hypertrophy Mouth/Parynx benign; Mallinpatti scale 3  Neck: Thick, no JVD, no carotid bruits; normal carotid upstroke Lungs: clear to ausculatation and percussion; no wheezing or rales Chest wall: No tenderness to palpation Heart: PMI not displaced.  Heart rate regular at approximately 60 beats per minute; No ectopy, s1 s2 normal 1/6 sem; no ectopy heard on auscultation Abdomen: Moderate central adiposity ;soft, nontender; no hepatosplenomehaly, BS+; abdominal aorta nontender and not dilated by palpation. Back: No CVA tenderness Pulses 2+ Extremities: Resolution of prior bilateral ankle swelling, Homan's sign negative  Neurologic: grossly nonfocal; cranial nerves intact. Psychological: Normal affect and mood  ECG (independently read by me): Normal sinus rhythm at 60 bpm.  Right bundle-branch block with repolarization changes.  November 2015 ECG (independently read by me): Normal sinus rhythm at 61.  Nonspecific T abnormality.  QTc interval 394 ms.  11/18/2013 ECG (independently read by me): Sinus bradycardia 58 beats per minute.  PR interval 198 ms; QTc interval 371 ms.  Nonspecific ST changes.  07/29/2013 ECG  (independently read by me):  Atrial flutter with 2:1 block with a ventricular rate of 87 beats per minute. Atrial rate is approximately 360 ms. 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 fib flutter now with right bundle branch block and repolarization changes.  Prior ECG of 06/04/2013: EKG  suggests probable atrial flutter with a ventricular rate of 105 beats per minute. There also are frequent PVCs. QTc interval is 436 ms. They're nonspecific T changes.  LABS: BMP Latest Ref Rng 09/29/2014 09/23/2014 04/01/2014  Glucose 70 -  99 mg/dL 97 96 126(H)  BUN 6 - 23 mg/dL 33(H) 35(H) 25(H)  Creatinine 0.50 - 1.35 mg/dL 2.01(H) 2.09(H) 1.79(H)  Sodium 135 - 145 mEq/L 142 140 142  Potassium 3.5 - 5.3 mEq/L 4.7 4.8 4.7  Chloride 96 - 112 mEq/L 106 106 107  CO2 19 - 32 mEq/L _0 Calcium 8.4 - 10.5 mg/dL 9.6 9.5 9.2   Hepatic Function Latest Ref Rng 09/29/2014 09/23/2014 04/01/2014  Total Protein 6.0 - 8.3 g/dL 7.0 7.0 6.9  Albumin 3.5 - 5.2 g/dL 4.1 4.2 4.3  AST 0 - 37 U/L _1 ALT 0 - 53 U/L 23 24 48  Alk Phosphatase 39 - 117 U/L 33(L) 39 35(L)  Total Bilirubin 0.2 - 1.2 mg/dL 1.0 0.7 0.9  Bilirubin, Direct 0.0 - 0.3 mg/dL - - -   CBC Latest Ref Rng 09/23/2014 04/01/2014 07/31/2013  WBC 4.0 - 10.5 K/uL 4.0 4.5 4.7  Hemoglobin 13.0 - 17.0 g/dL 13.7 13.0 14.4  Hematocrit 39.0 - 52.0 % 41.7 38.2(L) 42.0  Platelets 150 - 400 K/uL 91(L) 102(L) 110(L)   Lab Results  Component Value Date   TSH 1.252 09/29/2014   Lipid Panel     Component Value Date/Time   CHOL 94 09/29/2014 0844   TRIG 128 09/29/2014 0844   HDL 34* 09/29/2014 0844   CHOLHDL 2.8 09/29/2014 0844   VLDL 26 09/29/2014 0844   LDLCALC 34 09/29/2014 0844   LDLDIRECT 65.0 04/12/2013 0825     RADIOLOGY: No results found.    ASSESSMENT AND PLAN: Martin Cordova has documented normal coronary arteries by heart catheterization in 2008. He has a history of hypertension, mixed  hyperlipidemia, and has paroxysmal atrial fibrillation/flutter.  He had been maintained on carvedilol 50 mg twice a day in addition Rhythmol 225 mg every 8 hours.  He underwent successful cardioversion in March 2015 and is maintaining sinus rhythm without any awareness of recurrent arrhythmia.  His ECG today is stable.  He now is on a reduced dose of Rythmol at 225 mg twice a day in a reduced dose of carvedilol at 25 mg twice a day.  His resting pulse is 60.  His previous symptoms of dizziness have resolved..  With reference to his severe complex obstructive sleep apnea, he is tolerating his new BiPAP well as the machine very well. He is meeting Medicare compliance standards.  Subjectively, he continues to feel significantly improved with therapy.  Prior to initiating treatment, and he often would find himself falling asleep while driving and any time during the day. He denies any residual daytime sleepiness.  He is unaware of breakthrough snoring. His blood pressure today continues to be mildly elevated despite taking his current regimen.  He brought with him recordings of his blood pressures at home and I reviewed these.  These seem to consistently fall in the 120-150 range.  He has chronic kidney disease.  I reviewed his recent laboratory.  His creatinine is 2.01 which is slightly improved from several weeks ago.  A 2.09 but increased from 6 months ago when it was 1.79.  He is now off Lasix therapy.  BUN is 35.  I reviewed his recent lipid studies.  I have suggested he reduce his Niaspan dose from 1000 mg to 500 mg and if his labs continue to look excellent his Niaspan may ultimately be able to be completely discontinued in the future. He still is mildly obese.  He has lost approximately 26 pounds from  January when he weighed 257 pounds 2 today weighing 231 pounds.  Height commended him on his excellent effort.  He feels significantly better and has more energy.   He is tolerating anticoagulation without  bleeding side effects. He is not having any GERD symptoms on his current dose of Prilosec. He will be seeing Dr. Dennard Schaumann in 3 months. As long as he remains stable I will see him in 6 months for reevaluation.  Troy Sine, MD  10/07/2014  7:36 PM

## 2014-11-09 ENCOUNTER — Other Ambulatory Visit: Payer: Self-pay | Admitting: Cardiovascular Disease

## 2014-11-10 NOTE — Telephone Encounter (Signed)
Rx has been sent to the pharmacy electronically. ° °

## 2014-11-16 ENCOUNTER — Other Ambulatory Visit: Payer: Self-pay | Admitting: Cardiology

## 2014-12-14 ENCOUNTER — Other Ambulatory Visit: Payer: Self-pay | Admitting: Cardiovascular Disease

## 2014-12-15 NOTE — Telephone Encounter (Signed)
Rx(s) sent to pharmacy electronically.  

## 2014-12-15 NOTE — Telephone Encounter (Signed)
Called in refills for Atorvastatin ----- electronic refill failed.

## 2014-12-17 ENCOUNTER — Other Ambulatory Visit: Payer: Medicare Other

## 2014-12-19 ENCOUNTER — Ambulatory Visit: Payer: Medicare Other | Admitting: Family Medicine

## 2014-12-23 ENCOUNTER — Telehealth: Payer: Self-pay | Admitting: Cardiovascular Disease

## 2014-12-23 MED ORDER — CARVEDILOL 12.5 MG PO TABS: 12.5000 mg | ORAL_TABLET | Freq: Two times a day (BID) | ORAL | Status: DC

## 2014-12-23 NOTE — Telephone Encounter (Signed)
Please call,thinks his medicine needs ro be adjusted.He feels dizzy at times and sometimes his pulse rate is low.

## 2014-12-23 NOTE — Telephone Encounter (Signed)
Spoke with pt, aware of medication change. New script sent to the pharmacy. Patient voiced understanding to hold carvedilol if pulse <54. Patient voiced understanding to keep the rythmol dosage the same.

## 2014-12-23 NOTE — Telephone Encounter (Signed)
Reduce carvedilol to 12.5 mg bid and hold if P< 54

## 2014-12-23 NOTE — Telephone Encounter (Signed)
Spoke with pt, he has lost 30 lbs now. He has noticed for the last couple days he is having dizziness again. It occurs usually with standing but can occur after being up and walking. His bp this morning prior to medications was 138/80 and pulse in the 40's. His rythmol was reduced to 225 mg bid and carvedilol was reduced to 25 mg bid at last office visit. He wonders if the dosage needs to be changed again due to weight lose. Will forward for dr [facility] review.

## 2014-12-29 ENCOUNTER — Telehealth: Payer: Self-pay | Admitting: Cardiovascular Disease

## 2014-12-29 NOTE — Telephone Encounter (Signed)
Patient states he just wanted to be sure how to take his Carvedilol regarding the "holding for HR <54. Is it HOLD all the time or just for that dose?"  Reviewed Dr. Evette Georges notes. Advised patient that he is to take HR and if <54, he is to HOLD that dose.  Each dose is dependent on the HR at the time of taking the medication. Also advised him that if he is routinely having to hold it, to call office back so Dr. Claiborne Billings can adjust dose as needed. Patient verbalized understanding and appreciation for return phone call.

## 2014-12-29 NOTE — Telephone Encounter (Signed)
Pt called in stating that Dr. Claiborne Billings lowered his Carvedilol dosage and he was told that if his pulse rate got to low to stop taking it. He would like to know , how long did he need to go without taking the medication. Please call  Thanks

## 2015-01-05 ENCOUNTER — Other Ambulatory Visit: Payer: Medicare Other

## 2015-01-05 DIAGNOSIS — I10 Essential (primary) hypertension: Secondary | ICD-10-CM

## 2015-01-05 DIAGNOSIS — E669 Obesity, unspecified: Secondary | ICD-10-CM

## 2015-01-05 DIAGNOSIS — E1165 Type 2 diabetes mellitus with hyperglycemia: Secondary | ICD-10-CM

## 2015-01-05 DIAGNOSIS — E785 Hyperlipidemia, unspecified: Secondary | ICD-10-CM

## 2015-01-05 DIAGNOSIS — Z79899 Other long term (current) drug therapy: Secondary | ICD-10-CM

## 2015-01-05 DIAGNOSIS — IMO0002 Reserved for concepts with insufficient information to code with codable children: Secondary | ICD-10-CM

## 2015-01-05 LAB — LIPID PANEL
Cholesterol: 93 mg/dL — ABNORMAL LOW (ref 125–200)
HDL: 34 mg/dL — ABNORMAL LOW (ref >=40)
LDL Cholesterol: 35 mg/dL (ref <130)
Total CHOL/HDL Ratio: 2.7 Ratio (ref <=5.0)
Triglycerides: 120 mg/dL (ref <150)
VLDL: 24 mg/dL (ref <30)

## 2015-01-05 LAB — COMPLETE METABOLIC PANEL WITH GFR
ALT: 25 U/L (ref 9–46)
AST: 18 U/L (ref 10–35)
Albumin: 4.3 g/dL (ref 3.6–5.1)
Alkaline Phosphatase: 36 U/L — ABNORMAL LOW (ref 40–115)
BUN: 29 mg/dL — ABNORMAL HIGH (ref 7–25)
CO2: 27 mmol/L (ref 20–31)
Calcium: 9.7 mg/dL (ref 8.6–10.3)
Chloride: 102 mmol/L (ref 98–110)
Creat: 1.92 mg/dL — ABNORMAL HIGH (ref 0.70–1.18)
GFR, Est African American: 39 mL/min — ABNORMAL LOW (ref >=60)
GFR, Est Non African American: 34 mL/min — ABNORMAL LOW (ref >=60)
Glucose, Bld: 99 mg/dL (ref 70–99)
Potassium: 4.8 mmol/L (ref 3.5–5.3)
Sodium: 138 mmol/L (ref 135–146)
Total Bilirubin: 1.2 mg/dL (ref 0.2–1.2)
Total Protein: 6.7 g/dL (ref 6.1–8.1)

## 2015-01-05 LAB — HEMOGLOBIN A1C
Hgb A1c MFr Bld: 5.9 % — ABNORMAL HIGH (ref <5.7)
Mean Plasma Glucose: 123 mg/dL — ABNORMAL HIGH (ref <117)

## 2015-01-06 LAB — CBC WITH DIFFERENTIAL/PLATELET
Basophils Absolute: 0 10*3/uL (ref 0.0–0.1)
Basophils Relative: 0 % (ref 0–1)
Eosinophils Absolute: 0.1 10*3/uL (ref 0.0–0.7)
Eosinophils Relative: 2 % (ref 0–5)
HCT: 42 % (ref 39.0–52.0)
Hemoglobin: 14.4 g/dL (ref 13.0–17.0)
Lymphocytes Relative: 26 % (ref 12–46)
Lymphs Abs: 1.3 10*3/uL (ref 0.7–4.0)
MCH: 31.9 pg (ref 26.0–34.0)
MCHC: 34.3 g/dL (ref 30.0–36.0)
MCV: 92.9 fL (ref 78.0–100.0)
MPV: 12.1 fL (ref 8.6–12.4)
Monocytes Absolute: 0.5 10*3/uL (ref 0.1–1.0)
Monocytes Relative: 10 % (ref 3–12)
Neutro Abs: 3 10*3/uL (ref 1.7–7.7)
Neutrophils Relative %: 62 % (ref 43–77)
Platelets: 97 10*3/uL — ABNORMAL LOW (ref 150–400)
RBC: 4.52 MIL/uL (ref 4.22–5.81)
RDW: 14.3 % (ref 11.5–15.5)
WBC: 4.9 10*3/uL (ref 4.0–10.5)

## 2015-01-08 ENCOUNTER — Encounter: Payer: Self-pay | Admitting: Family Medicine

## 2015-01-08 ENCOUNTER — Ambulatory Visit (INDEPENDENT_AMBULATORY_CARE_PROVIDER_SITE_OTHER): Payer: Medicare Other | Admitting: Family Medicine

## 2015-01-08 VITALS — BP 150/88 | HR 62 | Temp 97.6°F | Resp 18 | Ht 71.0 in | Wt 216.0 lb

## 2015-01-08 DIAGNOSIS — E785 Hyperlipidemia, unspecified: Secondary | ICD-10-CM | POA: Diagnosis not present

## 2015-01-08 DIAGNOSIS — I480 Paroxysmal atrial fibrillation: Secondary | ICD-10-CM | POA: Diagnosis not present

## 2015-01-08 DIAGNOSIS — E119 Type 2 diabetes mellitus without complications: Secondary | ICD-10-CM

## 2015-01-08 NOTE — Progress Notes (Signed)
Subjective:    Patient ID: Martin Cordova, male    DOB: May 21, 1941, 74 y.o.   MRN: [id number]  HPI Patient continues to lose weight. I am very proud of him. Patient is off all diabetic medications now for over 3 months. Hemoglobin A1c has fallen from 6.1-5.9 even off medication. Most recent lab work continues to show dyslipidemia with an HDL cholesterol below 40. Creatinine is stable at 1.92. Platelet count remains low at 97. Patient is currently in atrial fibrillation by exam. Heart rate is well regulated at 62 bpm. Patient has been getting orthostatic dizziness at home. He states his blood pressure at home is ranging 110-120 over 60s. He continues to take a request 5 mg by mouth twice a day  Past Medical History  Diagnosis Date  . GERD 12/07/2006  . HYPERGLYCEMIA 12/07/2006  . HYPERLIPIDEMIA 12/07/2006    mixed wth an atherogenic dyslipidemic pattern  . HYPERTENSION 12/07/2006  . OBESITY 11/10/2009  . THROMBOCYTOPENIA 12/07/2006  . TRANSAMINASES, SERUM, ELEVATED 12/07/2006  . SLEEP APNEA 10/22/2007    uses bipap  . 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 Claiborne Billings  . Diabetes mellitus without complication   . Renal insufficiency     Cr to 3 on ACE  . Nephrolithiasis   . Normal coronary arteries 04/06/2007    by cath EF 55%., last ECHO 10/13/10 EF>55% mild MR, last nuc 06/16/10 EF 57% low risk scan  . Pulmonary HTN     RV syst.  40-78mmHg- moderate by echo  . Palpitations     tachy  . PAF (paroxysmal atrial fibrillation) 04/09/2013  . Colon polyps   . Diverticulosis   . Nonalcoholic fatty liver disease    Past Surgical History  Procedure Laterality Date  . Cholecystectomy    . Rotator cuff repair Right   . Cystoscopy/retrograde/ureteroscopy  08/19/2011    Procedure: CYSTOSCOPY/RETROGRADE/URETEROSCOPY;  Surgeon: Hanley Ben, MD;  Location: [facility];  Service: Urology;  Laterality: Right;  JJ STENT PLACEMENT   . Cardiac catheterization  04/06/2007    normal  cors  . Cardioversion N/A 08/05/2013    Procedure: CARDIOVERSION;  Surgeon: Troy Sine, MD;  Location: [facility] END[facility]COPY;  Service: Cardiovascular;  Laterality: N/A;   Current Outpatient Prescriptions on File Prior to Visit  Medication Sig Dispense Refill  . apixaban (ELIQUIS) 5 MG TABS tablet Take 1 tablet (5 mg total) by mouth 2 (two) times daily. 60 tablet 11  . atorvastatin (LIPITOR) 10 MG tablet TAKE 1 TABLET BY MOUTH AT BEDTIME. 30 tablet 10  . carvedilol (COREG) 12.5 MG tablet Take 1 tablet (12.5 mg total) by mouth 2 (two) times daily with a meal. 90 tablet 3  . ELIQUIS 5 MG TABS tablet TAKE 1 TABLET (5 MG TOTAL) BY MOUTH 2 (TWO) TIMES DAILY. 60 tablet 5  . hydrALAZINE (APRESOLINE) 25 MG tablet Take 1 tablet (25 mg total) by mouth 2 (two) times daily. 60 tablet 6  . niacin (NIASPAN) 500 MG CR tablet Take 1 tablet (500 mg total) by mouth at bedtime. 30 tablet 6  . NON FORMULARY BIPAP therapy    . omega-3 acid ethyl esters (LOVAZA) 1 G capsule Take 2 capsules (2 g total) by mouth daily. 60 capsule 11  . omeprazole (PRILOSEC) 20 MG capsule Take 1 capsule (20 mg total) by mouth daily. 30 capsule 11  . potassium citrate (UROCIT-K) 10 MEQ (1080 MG) SR tablet Take 10 mEq by mouth 3 (three) times daily with  meals.     . propafenone (RYTHMOL) 225 MG tablet Take 1 tablet (225 mg total) by mouth every 8 (eight) hours. (Patient taking differently: Take 225 mg by mouth 2 (two) times daily. ) 90 tablet 10   No current facility-administered medications on file prior to visit.   Allergies  Allergen Reactions  . Lisinopril Swelling    Renal insufficiency   History   Social History  . Marital Status: Married    Spouse Name: N/A  . Number of Children: 4  . Years of Education: N/A   Occupational History  . retired Pharmacist, hospital    Social History Main Topics  . Smoking status: Former Smoker -- 10 years    Types: Cigarettes    Quit date: 08/04/1980  . Smokeless tobacco: Former Systems developer    Types: Chew     Quit date: 08/08/2004  . Alcohol Use: 0.0 oz/week    0 Standard drinks or equivalent per week     Comment: 1 beer 2-3 times per month  . Drug Use: No  . Sexual Activity: Yes   Other Topics Concern  . Not on file   Social History Narrative     Review of Systems  All other systems reviewed and are negative.      Objective:   Physical Exam  Constitutional: He is oriented to person, place, and time. He appears well-developed and well-nourished. No distress.  Neck: No JVD present. No thyromegaly present.  Cardiovascular: Normal rate, normal heart sounds and intact distal pulses.  An irregularly irregular rhythm present.  No murmur heard. Pulmonary/Chest: Effort normal and breath sounds normal. No respiratory distress. He has no wheezes. He has no rales.  Abdominal: Soft. Bowel sounds are normal. He exhibits no distension and no mass. There is no tenderness. There is no rebound and no guarding.  Musculoskeletal: He exhibits no edema.  Lymphadenopathy:    He has no cervical adenopathy.  Neurological: He is alert and oriented to person, place, and time. He has normal reflexes. He displays normal reflexes. No cranial nerve deficit. He exhibits normal muscle tone. Coordination normal.  Skin: He is not diaphoretic.  Vitals reviewed.         Assessment & Plan:  Diabetes mellitus type II, controlled - Plan: Microalbumin, urine  PAF (paroxysmal atrial fibrillation)  Dyslipidemia  Blood sugars are well controlled. Patient is up-to-date with all his immunizations including his pneumonia vaccines. I did recommend that he follow-up with his urologist for prostate exam. Also recommended that he follow-up with his cardiologist. Given his elevated creatinine, and his thrombocytopenia, I have asked the patient to discuss with his cardiologist possibly decreasing his dose about requests to 2.5 mg by mouth twice a day. I believe the patient is in somewhat of a gray zone and I will appreciate  Dr. Lucy Chris opinion on this. I will also check a urine microalbumin. Patient has a history of renal insufficiency made worse by the ACE inhibitor. If the patient has significantly elevated microalbuminuria, we may consider trying a low-dose angiotensin receptor blocker with close monitoring of his creatinine. I would be willing to accept a rise in his creatinine less than 30%. Hopefully his urine microalbumin will be normal.

## 2015-01-09 LAB — MICROALBUMIN, URINE: Microalb, Ur: 1.8 mg/dL (ref <2.0)

## 2015-01-18 ENCOUNTER — Other Ambulatory Visit: Payer: Self-pay | Admitting: Cardiovascular Disease

## 2015-01-19 NOTE — Telephone Encounter (Signed)
Rx(s) sent to pharmacy electronically.  

## 2015-03-31 ENCOUNTER — Ambulatory Visit (INDEPENDENT_AMBULATORY_CARE_PROVIDER_SITE_OTHER): Payer: Medicare Other | Admitting: Cardiovascular Disease

## 2015-03-31 ENCOUNTER — Encounter: Payer: Self-pay | Admitting: Cardiovascular Disease

## 2015-03-31 VITALS — BP 152/80 | HR 86 | Ht 71.0 in | Wt 217.1 lb

## 2015-03-31 DIAGNOSIS — N289 Disorder of kidney and ureter, unspecified: Secondary | ICD-10-CM | POA: Diagnosis not present

## 2015-03-31 DIAGNOSIS — I480 Paroxysmal atrial fibrillation: Secondary | ICD-10-CM

## 2015-03-31 DIAGNOSIS — I10 Essential (primary) hypertension: Secondary | ICD-10-CM

## 2015-03-31 DIAGNOSIS — E785 Hyperlipidemia, unspecified: Secondary | ICD-10-CM

## 2015-03-31 DIAGNOSIS — G4730 Sleep apnea, unspecified: Secondary | ICD-10-CM

## 2015-03-31 NOTE — Progress Notes (Signed)
Patient ID: Martin Cordova, male   DOB: 1940/06/23, 74 y.o.   MRN: [id number]      HPI: Martin Cordova is a 74 y.o. male is who presents to the office today for a 6 month follow-up cardiology  evaluation.  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 In the past, he developed an obstructive uropathy attributed to kidney stones; creatinine had risen up to 3 and ultimately improved and stabilized at approximately 1.7. Martin Cordova uses his BiPAP daily and does note good sleep.  In the past he had issues with mild peripheral edema, which ultimately improved.  He had been previously taken off his lisinopril when his creatinine had risen in the setting of his obstructive uropathy.  In 2015 he developed recurrent episodes of palpitations and was found to have recurrent paroxysmal atrial fibrillation/flutter.  His medications were adjusted and  he was started on antiarrhythmic therapy with Rythmol to take in addition to his increasing doses of carvedilol.  He was started on Eliquis for anticoagulation.  He underwent successful cardioversion on 08/05/2013.  Since that time, he is unaware of any recurrent atrial fibrillation.  He has noticed more energy. He had been maintained on 50 mg twice a day of carvedilol in addition to Rythmol 225 mg every 8 hours, but due to slow pulse rates, these doses have ultimately been reduced.  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 He has been on CPAP therapy initially for approximately 8 years and has been on BiPAP auto SV for over 4 years. He admits to using his BiPAP with 100% compliance.  He obtained a new BiPAP machine in June.  He feels that his machine is  significantly improved from his prior one. When I last saw him I reviewed his  download of his new BiPAP auto SV machine indicates that 90% of the time is EPAP pressure was 17 and 90% of the time his pressure support was 5.8, giving an IPAP of 21.8 cm.  He is using it 100% of the time and is averaging 5 hours and 49 minutes on his most recent download from 12/07/2013 through 01/05/2014.  His average AHI was 7.5.  His device setting maximum BiPAP pressure is 25 cm in maximum EPAP pressure 21 cm.  His average central event index is 1.3, and average obstructive apneic index 0.2, with an average copy index of 6.0.  Average breath.  Rate is 18 breaths per minute with a minute ventilation of 12.1.  Average total body may 670 mL.  Laboratory on 04/01/2014 revealed BUN was 25, creatinine 1.79, which has been fairly stable over the past year.  TSH was normal at 1.78.  Hemoglobin A1c is now 6.9, which was slightly improved.  Hemoglobin 13, hematocrit 38.2.  His lipid studies were improved with a total cholesterol 116 and LDL cholesterol 42, however, he still had an atherogenic dyslipidemia pattern with elevated triglycerides at 219, elevated VLDL at 44, and low HDL at 30 on a regimen of atorvastatin 10 mg, Niaspan 1000 mg.  Since I last saw him, he has been taking carvedilol at 12.5 mg twice a day and profit known tumor 25 mg every 12 hours.  We had reduced his niacin to 500 mg and he continues to take atorvastatin 10 mg.  He has renal insufficiency with creatinines that have run in the 1.9-2.0 range.  However, since he is not over 80 years old and does not have severely reduced weight, he only has one of the criteria for dose reduction of eloquence and for this reason has been maintained on 5 mg twice a day.  He denies bleeding.  He tells me the past several days.  He has held a dose of carvedilol since he thought his pulse was getting too slow, although was in the 50s.  Over the past year, he has had purposeful weight  loss from a peak weight of 250 pounds to weight at home this morning of approximately 213 pounds.  He presents to the office today for evaluation.  Past Medical History  Diagnosis Date  . GERD 12/07/2006  . HYPERGLYCEMIA 12/07/2006  . HYPERLIPIDEMIA 12/07/2006    mixed wth an atherogenic dyslipidemic pattern  . HYPERTENSION 12/07/2006  . OBESITY 11/10/2009  . THROMBOCYTOPENIA 12/07/2006  . TRANSAMINASES, SERUM, ELEVATED 12/07/2006  . SLEEP APNEA 10/22/2007    uses bipap  . 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 kelly  . Diabetes mellitus without complication   . Renal insufficiency     Cr to 3 on ACE  . Nephrolithiasis   . Normal coronary arteries 04/06/2007    by cath EF 55%., last ECHO 10/13/10 EF>55% mild MR, last nuc 06/16/10 EF 57% low risk scan  . Pulmonary HTN     RV syst.  40-50mmHg- moderate by echo  . Palpitations     tachy  . PAF (paroxysmal atrial fibrillation) 04/09/2013    Past Surgical History  Procedure Laterality Date  . Cholecystectomy    . Rotator cuff repair    . Cystoscopy/retrograde/ureteroscopy  08/19/2011    Procedure: CYSTOSCOPY/RETROGRADE/URETEROSCOPY;  Surgeon: Martin Nesi, MD;  Location: [facility];  Service: Urology;  Laterality: Right;  JJ STENT PLACEMENT   . Cardiac catheterization  04/06/2007    normal cors    Allergies  Allergen Reactions  . Lisinopril     Renal insufficiency    Current outpatient prescriptions:  .  apixaban (ELIQUIS) 5 MG TABS tablet, Take 1 tablet (5 mg total) by mouth 2 (two) times daily., Disp: 60 tablet, Rfl: 11 .  atorvastatin (LIPITOR) 10 MG tablet, TAKE 1 TABLET BY MOUTH AT BEDTIME., Disp: 30 tablet, Rfl: 10 .  carvedilol (COREG) 12.5 MG tablet, Take 1 tablet (12.5 mg total) by mouth 2 (two) times daily with a meal., Disp: 90 tablet, Rfl: 3 .  ELIQUIS 5 MG TABS tablet, TAKE 1 TABLET (5 MG TOTAL) BY MOUTH 2 (TWO) TIMES DAILY., Disp: 60 tablet, Rfl: 5 .  FLUZONE HIGH-DOSE 0.5 ML SUSY, TO BE  ADMINISTERED BY PHARMACIST FOR IMMUNIZATION, Disp: , Rfl: 0 .  hydrALAZINE (APRESOLINE) 25 MG tablet, Take 1 tablet (25 mg total) by mouth 2 (two) times daily., Disp: 60 tablet, Rfl: 6 .  NON FORMULARY, BIPAP therapy, Disp: , Rfl:  .  omega-3 acid ethyl esters (LOVAZA) 1 G capsule, Take 2 capsules (2 g total) by mouth daily., Disp: 60 capsule, Rfl: 11 .  omeprazole (PRILOSEC) 20 MG capsule, Take 1 capsule (20 mg total) by mouth daily., Disp: 30 capsule, Rfl: 11 .  potassium citrate (UROCIT-K) 10 MEQ (1080 MG) SR tablet, Take 10 mEq by mouth 3 (three) times daily with meals. , Disp: , Rfl:  .    propafenone (RYTHMOL) 225 MG tablet, Take 225 mg by mouth 2 (two) times daily., Disp: , Rfl:     Socially he is married has 4 children and 2 grandchildren. He is a distant relative to the "Danser brothers." He does try to walk and exercise. There is no tobacco use. He does drink occasional alcohol.   ROS General: Negative; No fevers, chills, or night sweats;  HEENT: Negative; No changes in vision or hearing, sinus congestion, difficulty swallowing Pulmonary: Negative; No cough, wheezing, shortness of breath, hemoptysis Cardiovascular: Negative; No chest pain, presyncope, syncope, palpatations No recent peripheral edema GI: Negative; No nausea, vomiting, diarrhea, or abdominal pain GU: Renal function has stabilized; No dysuria, hematuria, or difficulty voiding; some difficulty with erectile function Musculoskeletal: Negative; no myalgias, joint pain, or weakness Hematologic/Oncology: Negative; no easy bruising, bleeding Endocrine: Negative; no heat/cold intolerance; no diabetes Neuro: Negative; no changes in balance, headaches Skin: Negative; No rashes or skin lesions Psychiatric: Negative; No behavioral problems, depression Sleep: He is using his BiPAP therapy with 100% compliance.  No snoring, daytime sleepiness, hypersomnolence, bruxism, restless legs, hypnogognic hallucinations, no  cataplexy Other comprehensive 14 point system review is negative.  PE BP 150/80  Pulse 61  Ht 6' (1.829 m)  Wt 255 lb 4.8 oz (115.758 kg)  BMI 34.6 kg/m2  Repeat blood pressure by me was 140/82 Wt Readings from Last 3 Encounters:  03/31/15 217 lb 1.6 oz (98.476 kg)  01/08/15 216 lb (97.977 kg)  10/06/14 231 lb (104.781 kg)   General: Alert, oriented, no distress.  HEENT: Normocephalic, atraumatic. Pupils round and reactive; sclera anicteric; no xanthelasmas. Extraocular muscles are full. There is no lid lag. Nose without nasal septal hypertrophy Mouth/Parynx benign; Mallinpatti scale 3  Neck: Thick, no JVD, no carotid bruits; normal carotid upstroke Lungs: clear to ausculatation and percussion; no wheezing or rales Chest wall: No tenderness to palpation Heart: PMI not displaced.  Heart rate regular at approximately 80 beats per minute with occasional ectopy, s1 s2 normal 1/6 sem; no ectopy heard on auscultation Abdomen: Moderate central adiposity ;soft, nontender; no hepatosplenomehaly, BS+; abdominal aorta nontender and not dilated by palpation. Back: No CVA tenderness Pulses 2+ Extremities: Resolution of prior bilateral ankle swelling, Homan's sign negative  Neurologic: grossly nonfocal; cranial nerves intact. Psychological: Normal affect and mood  ECG (independently read by me): sinus rhythm with first-degree AV block with a PR interval at 216 ms.  Ventricular rate 86 bpm with occasional PVCs.    May 2016ECG (independently read by me): Normal sinus rhythm at 60 bpm.  Right bundle-branch block with repolarization changes.  November 2015 ECG (independently read by me): Normal sinus rhythm at 61.  Nonspecific T abnormality.  QTc interval 394 ms.  11/18/2013 ECG (independently read by me): Sinus bradycardia 58 beats per minute.  PR interval 198 ms; QTc interval 371 ms.  Nonspecific ST changes.  07/29/2013 ECG  (independently read by me): Atrial flutter with 2:1 block with a  ventricular rate of 87 beats per minute. Atrial rate is approximately 360 ms. 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 fib flutter now with right bundle branch block and repolarization changes.  Prior ECG of 06/04/2013: EKG  suggests probable atrial flutter with a ventricular rate of 105 beats per minute. There also are frequent PVCs. QTc interval is 436 ms. They're nonspecific T changes.  LABS: BMP Latest Ref Rng 01/05/2015 09/29/2014 09/23/2014  Glucose 70 - 99 mg/dL 99 97 96  BUN 7 - 25 mg/dL 29(H) 33(H) 35(H)  Creatinine 0.70 - 1.18 mg/dL 1.92(H) 2.01(H) 2.09(H)  Sodium 135 - 146 mmol/L 138 142 140  Potassium 3.5 - 5.3 mmol/L 4.8 4.7 4.8  Chloride 98 - 110 mmol/L 102 106 106  CO2 20 - 31 mmol/L 27 25 24  Calcium 8.6 - 10.3 mg/dL 9.7 9.6 9.5   Hepatic Function Latest Ref Rng 01/05/2015 09/29/2014 09/23/2014  Total Protein 6.1 - 8.1 g/dL 6.7 7.0 7.0  Albumin 3.6 - 5.1 g/dL 4.3 4.1 4.2  AST 10 - 35 U/L 18 18 19  ALT 9 - 46 U/L 25 23 24  Alk Phosphatase 40 - 115 U/L 36(L) 33(L) 39  Total Bilirubin 0.2 - 1.2 mg/dL 1.2 1.0 0.7  Bilirubin, Direct 0.0 - 0.3 mg/dL - - -   CBC Latest Ref Rng 01/05/2015 09/23/2014 04/01/2014  WBC 4.0 - 10.5 K/uL 4.9 4.0 4.5  Hemoglobin 13.0 - 17.0 g/dL 14.4 13.7 13.0  Hematocrit 39.0 - 52.0 % 42.0 41.7 38.2(L)  Platelets 150 - 400 K/uL 97(L) 91(L) 102(L)   Lab Results  Component Value Date   TSH 1.252 09/29/2014   Lipid Panel     Component Value Date/Time   CHOL 93* 01/05/2015 0837   TRIG 120 01/05/2015 0837   HDL 34* 01/05/2015 0837   CHOLHDL 2.7 01/05/2015 0837   VLDL 24 01/05/2015 0837   LDLCALC 35 01/05/2015 0837   LDLDIRECT 65.0 04/12/2013 0825     RADIOLOGY: No results found.    ASSESSMENT AND PLAN: Martin Cordova is a 74-year-old Caucasian male who  has documented normal coronary arteries by heart catheterization in 2008. He has a history of hypertension, mixed  hyperlipidemia, and has paroxysmal atrial fibrillation/flutter.  He had been maintained on carvedilol and Rhythmol 225 mg.  He underwent successful cardioversion in March 2015 and has been maintaining sinus rhythm without any awareness of recurrent arrhythmia.  last several days, he has felt that his pulse was in the low 50s and he has held his atenolol several nights.  His ECG today now demonstrates his heart rate in the upper 80s with occasional PVCs.  I have recommended that he not hold his carvedilol, but if his pulse is in the low 50s he can reduce the dose to 6.25 mg that dose, but resumed at 12.5 twice a day.  I suspect his rate is in the 80s now because he has held several doses. With reference to his severe complex obstructive sleep apnea, he is tolerating his  BiPAP machine very well. He is meeting Medicare compliance standards.  Subjectively, he continues to feel significantly improved with therapy.  Prior to initiating treatment, and he often would find himself falling asleep while driving and any time during the day. He denies any residual daytime sleepiness.  He is unaware of breakthrough snoring. His blood pressure today is in the upper normal to mildly increased range and I suspect this may be contributed by his withholding of several doses of carvedilol over the past several days.   With his renal insufficiency, he is now off diuretic therapy.  His creatinine is 1.92 on recent blood work 2 months ago.  Although his creatinine clearance is less than 50 he still qualifies for eliquis 5 mg twice a day dosing  rather than the reduced 2.5 mg twice a day dose since he is not older than 74 years old and he does not have markedly reduced weight.  In order to qualify for the reduced   dose.  He would need to have 2 out of the 3 components for dose reduction.  I commended him on his weight loss of a approximatey 40 pounds since January..  His most recent lipid studies on the reduced dose of Niaspan at 500 mg  continues to be excellent without any recurrence of his high triglycerides and VLDL levels.  For this reason, I recommended that he discontinue Niaspan altogether, but continue to take the present dose of atorvastatin 10 mg daily. He is tolerating anticoagulation without bleeding side effects. He is not having any GERD symptoms on his current dose of Prilosec.  He will be following up with Dr. Pickard.  I will see him in 6 months for cardiology evaluation.    Time spent: 25 minutes  KELLY,THOMAS A, MD  03/31/2015  7:13 PM      

## 2015-03-31 NOTE — Patient Instructions (Signed)
Your physician has recommended you make the following change in your medication: STOP the niaspan.  Your physician wants you to follow-up in: 6 months or sooner if needed. You will receive a reminder letter in the mail two months in advance. If you don't receive a letter, please call our office to schedule the follow-up appointment.  If you need a refill on your cardiac medications before your next appointment, please call your pharmacy.

## 2015-04-19 ENCOUNTER — Other Ambulatory Visit: Payer: Self-pay | Admitting: Cardiovascular Disease

## 2015-04-24 ENCOUNTER — Ambulatory Visit: Payer: Medicare Other | Admitting: Family Medicine

## 2015-04-28 ENCOUNTER — Ambulatory Visit (INDEPENDENT_AMBULATORY_CARE_PROVIDER_SITE_OTHER): Payer: Medicare Other | Admitting: Family Medicine

## 2015-04-28 ENCOUNTER — Encounter: Payer: Self-pay | Admitting: Family Medicine

## 2015-04-28 VITALS — BP 132/74 | HR 60 | Temp 98.6°F | Resp 16 | Ht 71.0 in | Wt 215.0 lb

## 2015-04-28 DIAGNOSIS — E1121 Type 2 diabetes mellitus with diabetic nephropathy: Secondary | ICD-10-CM | POA: Diagnosis not present

## 2015-04-28 LAB — COMPLETE METABOLIC PANEL WITH GFR
ALT: 21 U/L (ref 9–46)
AST: 16 U/L (ref 10–35)
Albumin: 4.2 g/dL (ref 3.6–5.1)
Alkaline Phosphatase: 39 U/L — ABNORMAL LOW (ref 40–115)
BUN: 30 mg/dL — ABNORMAL HIGH (ref 7–25)
CO2: 25 mmol/L (ref 20–31)
Calcium: 9.1 mg/dL (ref 8.6–10.3)
Chloride: 107 mmol/L (ref 98–110)
Creat: 1.64 mg/dL — ABNORMAL HIGH (ref 0.70–1.18)
GFR, Est African American: 48 mL/min — ABNORMAL LOW (ref >=60)
GFR, Est Non African American: 41 mL/min — ABNORMAL LOW (ref >=60)
Glucose, Bld: 95 mg/dL (ref 70–99)
Potassium: 4.9 mmol/L (ref 3.5–5.3)
Sodium: 141 mmol/L (ref 135–146)
Total Bilirubin: 1.1 mg/dL (ref 0.2–1.2)
Total Protein: 6.5 g/dL (ref 6.1–8.1)

## 2015-04-28 LAB — CBC WITH DIFFERENTIAL/PLATELET
Basophils Absolute: 0 10*3/uL (ref 0.0–0.1)
Basophils Relative: 0 % (ref 0–1)
Eosinophils Absolute: 0.1 10*3/uL (ref 0.0–0.7)
Eosinophils Relative: 2 % (ref 0–5)
HCT: 42.5 % (ref 39.0–52.0)
Hemoglobin: 14.1 g/dL (ref 13.0–17.0)
Lymphocytes Relative: 21 % (ref 12–46)
Lymphs Abs: 1 10*3/uL (ref 0.7–4.0)
MCH: 30.9 pg (ref 26.0–34.0)
MCHC: 33.2 g/dL (ref 30.0–36.0)
MCV: 93 fL (ref 78.0–100.0)
MPV: 12.3 fL (ref 8.6–12.4)
Monocytes Absolute: 0.4 10*3/uL (ref 0.1–1.0)
Monocytes Relative: 8 % (ref 3–12)
Neutro Abs: 3.2 10*3/uL (ref 1.7–7.7)
Neutrophils Relative %: 69 % (ref 43–77)
Platelets: 106 10*3/uL — ABNORMAL LOW (ref 150–400)
RBC: 4.57 MIL/uL (ref 4.22–5.81)
RDW: 13.6 % (ref 11.5–15.5)
WBC: 4.6 10*3/uL (ref 4.0–10.5)

## 2015-04-28 LAB — LIPID PANEL
Cholesterol: 96 mg/dL — ABNORMAL LOW (ref 125–200)
HDL: 41 mg/dL (ref >=40)
LDL Cholesterol: 41 mg/dL (ref <130)
Total CHOL/HDL Ratio: 2.3 Ratio (ref <=5.0)
Triglycerides: 71 mg/dL (ref <150)
VLDL: 14 mg/dL (ref <30)

## 2015-04-28 LAB — HEMOGLOBIN A1C
Hgb A1c MFr Bld: 5.8 % — ABNORMAL HIGH (ref <5.7)
Mean Plasma Glucose: 120 mg/dL — ABNORMAL HIGH (ref <117)

## 2015-04-28 MED ORDER — SILDENAFIL CITRATE 100 MG PO TABS: 50.0000 mg | ORAL_TABLET | Freq: Every day | ORAL | Status: DC | PRN

## 2015-04-28 NOTE — Progress Notes (Signed)
Subjective:    Patient ID: Martin Cordova, male    DOB: 30-May-1941, 74 y.o.   MRN: [id number]  HPI 8/16 Patient continues to lose weight. I am very proud of him. Patient is off all diabetic medications now for over 3 months. Hemoglobin A1c has fallen from 6.1-5.9 even off medication. Most recent lab work continues to show dyslipidemia with an HDL cholesterol below 40. Creatinine is stable at 1.92. Platelet count remains low at 97. Patient is currently in atrial fibrillation by exam. Heart rate is well regulated at 62 bpm. Patient has been getting orthostatic dizziness at home. He states his blood pressure at home is ranging 110-120 over 60s. He continues to take eliquis 5 mg by mouth twice a day.  At that time, my plan was: Blood sugars are well controlled. Patient is up-to-date with all his immunizations including his pneumonia vaccines. I did recommend that he follow-up with his urologist for prostate exam. Also recommended that he follow-up with his cardiologist. Given his elevated creatinine, and his thrombocytopenia, I have asked the patient to discuss with his cardiologist possibly decreasing his dose about requests to 2.5 mg by mouth twice a day. I believe the patient is in somewhat of a gray zone and I will appreciate Dr. Evette Georges opinion on this. I will also check a urine microalbumin. Patient has a history of renal insufficiency made worse by the ACE inhibitor. If the patient has significantly elevated microalbuminuria, we may consider trying a low-dose angiotensin receptor blocker with close monitoring of his creatinine. I would be willing to accept a rise in his creatinine less than 30%. Hopefully his urine microalbumin will be normal.  04/28/15 Microalbumin was normal and therefore, I did not start an ARB for the reasons outlined above.  He is here for follow up.  His cardiologist elected to continue eliquis at 5 mg by mouth twice a day. His thrombocytopenia has not been checked since I  last saw him. He denies any chest pain shortness of breath or dyspnea on exertion. His weight remains stable at 215 pounds. He denies any hypoglycemic episodes. He denies any polyuria polydipsia or blurred vision. His blood pressure remained stable at 132/74  Past Medical History  Diagnosis Date  . GERD 12/07/2006  . HYPERGLYCEMIA 12/07/2006  . HYPERLIPIDEMIA 12/07/2006    mixed wth an atherogenic dyslipidemic pattern  . HYPERTENSION 12/07/2006  . OBESITY 11/10/2009  . THROMBOCYTOPENIA 12/07/2006  . TRANSAMINASES, SERUM, ELEVATED 12/07/2006  . SLEEP APNEA 10/22/2007    uses bipap  . 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 Claiborne Billings  . Diabetes mellitus without complication ([place])   . Renal insufficiency     Cr to 3 on ACE  . Nephrolithiasis   . Normal coronary arteries 04/06/2007    by cath EF 55%., last ECHO 10/13/10 EF>55% mild MR, last nuc 06/16/10 EF 57% low risk scan  . Pulmonary HTN ([facility])     RV syst.  40-88mmHg- moderate by echo  . Palpitations     tachy  . PAF (paroxysmal atrial fibrillation) ([place]) 04/09/2013  . Colon polyps   . Diverticulosis   . Nonalcoholic fatty liver disease    Past Surgical History  Procedure Laterality Date  . Cholecystectomy    . Rotator cuff repair Right   . Cystoscopy/retrograde/ureteroscopy  08/19/2011    Procedure: CYSTOSCOPY/RETROGRADE/URETEROSCOPY;  Surgeon: Hanley Ben, MD;  Location: [facility];  Service: Urology;  Laterality: Right;  JJ STENT PLACEMENT   .  Cardiac catheterization  04/06/2007    normal cors  . Cardioversion N/A 08/05/2013    Procedure: CARDIOVERSION;  Surgeon: Troy Sine, MD;  Location: [facility];  Service: Cardiovascular;  Laterality: N/A;   Current Outpatient Prescriptions on File Prior to Visit  Medication Sig Dispense Refill  . apixaban (ELIQUIS) 5 MG TABS tablet Take 1 tablet (5 mg total) by mouth 2 (two) times daily. 60 tablet 11  . atorvastatin (LIPITOR) 10 MG tablet TAKE 1 TABLET BY MOUTH AT  BEDTIME. 30 tablet 10  . carvedilol (COREG) 12.5 MG tablet Take 1 tablet (12.5 mg total) by mouth 2 (two) times daily with a meal. 90 tablet 3  . FLUZONE HIGH-DOSE 0.5 ML SUSY TO BE ADMINISTERED BY PHARMACIST FOR IMMUNIZATION  0  . hydrALAZINE (APRESOLINE) 25 MG tablet Take 1 tablet (25 mg total) by mouth 2 (two) times daily. 60 tablet 6  . NON FORMULARY BIPAP therapy    . omega-3 acid ethyl esters (LOVAZA) 1 G capsule TAKE 2 CAPSULES BY MOUTH EVERY DAY 60 capsule 5  . omeprazole (PRILOSEC) 20 MG capsule Take 1 capsule (20 mg total) by mouth daily. 30 capsule 11  . potassium citrate (UROCIT-K) 10 MEQ (1080 MG) SR tablet Take 10 mEq by mouth 3 (three) times daily with meals.     . propafenone (RYTHMOL) 225 MG tablet Take 225 mg by mouth 2 (two) times daily.     No current facility-administered medications on file prior to visit.   Allergies  Allergen Reactions  . Lisinopril Swelling    Renal insufficiency   Social History   Social History  . Marital Status: Married    Spouse Name: N/A  . Number of Children: 4  . Years of Education: N/A   Occupational History  . retired Pharmacist, hospital    Social History Main Topics  . Smoking status: Former Smoker -- 10 years    Types: Cigarettes    Quit date: 08/04/1980  . Smokeless tobacco: Former Systems developer    Types: Chew    Quit date: 08/08/2004  . Alcohol Use: 0.0 oz/week    0 Standard drinks or equivalent per week     Comment: 1 beer 2-3 times per month  . Drug Use: No  . Sexual Activity: Yes   Other Topics Concern  . Not on file   Social History Narrative     Review of Systems  All other systems reviewed and are negative.      Objective:   Physical Exam  Constitutional: He is oriented to person, place, and time. He appears well-developed and well-nourished. No distress.  Neck: No JVD present. No thyromegaly present.  Cardiovascular: Normal rate, normal heart sounds and intact distal pulses.  An irregularly irregular rhythm present.    No murmur heard. Pulmonary/Chest: Effort normal and breath sounds normal. No respiratory distress. He has no wheezes. He has no rales.  Abdominal: Soft. Bowel sounds are normal. He exhibits no distension and no mass. There is no tenderness. There is no rebound and no guarding.  Musculoskeletal: He exhibits no edema.  Lymphadenopathy:    He has no cervical adenopathy.  Neurological: He is alert and oriented to person, place, and time. He has normal reflexes. No cranial nerve deficit. He exhibits normal muscle tone. Coordination normal.  Skin: He is not diaphoretic.  Vitals reviewed.  Wt Readings from Last 3 Encounters:  04/28/15 215 lb (97.523 kg)  03/31/15 217 lb 1.6 oz (98.476 kg)  01/08/15 216 lb (97.977 kg)  Assessment & Plan:  Controlled type 2 diabetes mellitus with diabetic nephropathy, without long-term current use of insulin ([place]) - Plan: CBC with Differential/Platelet, COMPLETE METABOLIC PANEL WITH GFR, Lipid panel, Hemoglobin A1c  Blood pressures well controlled. I will check a hemoglobin A1c along with a fasting lipid panel. Goal LDL cholesterol is less than 100. Goal hemoglobin A1c is less than 6.5. If both of these values are at goal, I would recheck the patient in 6 months.

## 2015-05-21 ENCOUNTER — Encounter: Payer: Self-pay | Admitting: Gastroenterology

## 2015-05-24 ENCOUNTER — Other Ambulatory Visit: Payer: Self-pay | Admitting: Cardiovascular Disease

## 2015-06-21 ENCOUNTER — Other Ambulatory Visit: Payer: Self-pay | Admitting: Cardiovascular Disease

## 2015-06-28 ENCOUNTER — Other Ambulatory Visit: Payer: Self-pay | Admitting: Cardiovascular Disease

## 2015-06-29 NOTE — Telephone Encounter (Signed)
Rx request sent to pharmacy.  

## 2015-07-12 ENCOUNTER — Other Ambulatory Visit: Payer: Self-pay | Admitting: Family Medicine

## 2015-07-13 NOTE — Telephone Encounter (Signed)
Refill appropriate and filled per protocol. 

## 2015-08-09 ENCOUNTER — Other Ambulatory Visit: Payer: Self-pay | Admitting: Cardiovascular Disease

## 2015-10-23 ENCOUNTER — Encounter: Payer: Self-pay | Admitting: Cardiovascular Disease

## 2015-10-23 ENCOUNTER — Ambulatory Visit (INDEPENDENT_AMBULATORY_CARE_PROVIDER_SITE_OTHER): Payer: Medicare Other | Admitting: Cardiovascular Disease

## 2015-10-23 VITALS — BP 147/95 | HR 83 | Ht 71.0 in | Wt 215.0 lb

## 2015-10-23 DIAGNOSIS — K219 Gastro-esophageal reflux disease without esophagitis: Secondary | ICD-10-CM | POA: Diagnosis not present

## 2015-10-23 DIAGNOSIS — I4892 Unspecified atrial flutter: Secondary | ICD-10-CM

## 2015-10-23 DIAGNOSIS — I10 Essential (primary) hypertension: Secondary | ICD-10-CM

## 2015-10-23 DIAGNOSIS — Z7901 Long term (current) use of anticoagulants: Secondary | ICD-10-CM

## 2015-10-23 MED ORDER — PROPAFENONE HCL 225 MG PO TABS: 225.0000 mg | ORAL_TABLET | Freq: Three times a day (TID) | ORAL | Status: DC

## 2015-10-23 MED ORDER — CARVEDILOL 12.5 MG PO TABS: 18.7500 mg | ORAL_TABLET | Freq: Two times a day (BID) | ORAL | Status: DC

## 2015-10-23 NOTE — Progress Notes (Signed)
Patient ID: Martin Cordova, male   DOB: [birth year], 75 y.o.   MRN: [id number]      HPI: Martin Cordova is a 75 y.o. male is who presents to the office today for a 7 month follow-up cardiology  evaluation.  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 In the past, he developed an obstructive uropathy attributed to kidney stones; creatinine had risen up to 3 and ultimately improved and stabilized at approximately 1.7. Martin Cordova uses his BiPAP daily and does note good sleep.  In the past he had issues with mild peripheral edema, which ultimately improved.  He had been previously taken off his lisinopril when his creatinine had risen in the setting of his obstructive uropathy.  In 2015 he developed recurrent episodes of palpitations and was found to have recurrent paroxysmal atrial fibrillation/flutter.  His medications were adjusted and  he was started on antiarrhythmic therapy with Rythmol to take in addition to his increasing doses of carvedilol.  He was started on Eliquis for anticoagulation.  He underwent successful cardioversion on 08/05/2013.  Since that time, he is unaware of any recurrent atrial fibrillation.  He has noticed more energy. He had been maintained on 50 mg twice a day of carvedilol in addition to Rythmol 225 mg every 8 hours, but due to slow pulse rates, these doses have ultimately been reduced.  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 He has been on CPAP therapy initially for approximately 8 years and has been on BiPAP auto SV for over 4 years. He admits to using his BiPAP with 100% compliance.  He obtained a new BiPAP machine in June.  He feels that his machine is  significantly improved from his prior one. When I last saw him I reviewed his  download of his new BiPAP auto SV machine indicates that 90% of the time is EPAP pressure was 17 and 90% of the time his pressure support was 5.8, giving an IPAP of 21.8 cm.  He is using it 100% of the time and is averaging 5 hours and 49 minutes on his most recent download from 12/07/2013 through 01/05/2014.  His average AHI was 7.5.  His device setting maximum BiPAP pressure is 25 cm in maximum EPAP pressure 21 cm.  His average central event index is 1.3, and average obstructive apneic index 0.2, with an average copy index of 6.0.  Average breath.  Rate is 18 breaths per minute with a minute ventilation of 12.1.  Average total body may 670 mL.  Since I last saw him, he has been unaware of any arrhythmia.  He now has been on carvedilol 12.5 mg twice a day, Rythmol 225 mg twice a day, hydralazine 25 mg twice a day and eliquis  5 mg twice a day.  He also has been taking omeprazole for GERD.  For hyperlipidemia.  He has been on atorvastatin 10 mg.  He presents for follow up evaluation.  Past Medical History  Diagnosis Date  . GERD 12/07/2006  . HYPERGLYCEMIA 12/07/2006  . HYPERLIPIDEMIA 12/07/2006    mixed wth an atherogenic dyslipidemic pattern  . HYPERTENSION 12/07/2006  . OBESITY 11/10/2009  . THROMBOCYTOPENIA 12/07/2006  .  TRANSAMINASES, SERUM, ELEVATED 12/07/2006  . SLEEP APNEA 10/22/2007    uses bipap  . 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 Claiborne Billings  . Diabetes mellitus without complication   . Renal insufficiency     Cr to 3 on ACE  . Nephrolithiasis   . Normal coronary arteries 04/06/2007    by cath EF 55%., last ECHO 10/13/10 EF>55% mild MR, last nuc 06/16/10 EF 57% low risk scan  . Pulmonary HTN     RV syst.  40-22mHg- moderate by echo  . Palpitations     tachy  . PAF (paroxysmal atrial fibrillation) 04/09/2013    Past Surgical History  Procedure Laterality Date  . Cholecystectomy    . Rotator cuff  repair    . Cystoscopy/retrograde/ureteroscopy  08/19/2011    Procedure: CYSTOSCOPY/RETROGRADE/URETEROSCOPY;  Surgeon: MHanley Ben MD;  [facility]  Service: Urology;  Laterality: Right;  JJ STENT PLACEMENT   . Cardiac catheterization  04/06/2007    normal cors    Allergies  Allergen Reactions  . Lisinopril     Renal insufficiency    Current outpatient prescriptions:  .  atorvastatin (LIPITOR) 10 MG tablet, TAKE 1 TABLET BY MOUTH AT BEDTIME., Disp: 30 tablet, Rfl: 10 .  carvedilol (COREG) 12.5 MG tablet, Take 1.5 tablets (18.75 mg total) by mouth 2 (two) times daily with a meal., Disp: 90 tablet, Rfl: 6 .  ELIQUIS 5 MG TABS tablet, TAKE 1 TABLET TWICE A DAY, Disp: 60 tablet, Rfl: 5 .  hydrALAZINE (APRESOLINE) 25 MG tablet, TAKE 1 TABLET (25 MG TOTAL) BY MOUTH 2 (TWO) TIMES DAILY., Disp: 60 tablet, Rfl: 4 .  NON FORMULARY, BIPAP therapy, Disp: , Rfl:  .  omega-3 acid ethyl esters (LOVAZA) 1 G capsule, TAKE 2 CAPSULES BY MOUTH EVERY DAY, Disp: 60 capsule, Rfl: 5 .  omeprazole (PRILOSEC) 20 MG capsule, TAKE 1 CAPSULE (20 MG TOTAL) BY MOUTH DAILY., Disp: 30 capsule, Rfl: 11 .  potassium citrate (UROCIT-K) 10 MEQ (1080 MG) SR tablet, Take 10 mEq by mouth 3 (three) times daily with meals. , Disp: , Rfl:  .  propafenone (RYTHMOL) 225 MG tablet, Take 1 tablet (225 mg total) by mouth every 8 (eight) hours., Disp: 90 tablet, Rfl: 6 .  sildenafil (VIAGRA) 100 MG tablet, Take 0.5-1 tablets (50-100 mg total) by mouth daily as needed for erectile dysfunction., Disp: 5 tablet, Rfl: 11  Socially he is married has 4 children and 2 grandchildren. He is a distant relative to the "Cordova brothers." He does try to walk and exercise. There is no tobacco use. He does drink occasional alcohol.   ROS General: Negative; No fevers, chills, or night sweats;  HEENT: Negative; No changes in vision or hearing, sinus congestion, difficulty swallowing Pulmonary: Negative; No cough,  wheezing, shortness of breath, hemoptysis Cardiovascular: Negative; No chest pain, presyncope, syncope, palpatations No recent peripheral edema GI: Negative; No nausea, vomiting, diarrhea, or abdominal pain GU: Renal function has stabilized; No dysuria, hematuria, or difficulty voiding; some difficulty with erectile function Musculoskeletal: Negative; no myalgias, joint pain, or weakness Hematologic/Oncology: Negative; no easy bruising, bleeding Endocrine: Negative; no heat/cold intolerance; no diabetes Neuro: Negative; no changes in balance, headaches Skin: Negative; No rashes or skin lesions Psychiatric: Negative; No behavioral problems, depression Sleep: He is using his BiPAP therapy with 100% compliance.  No snoring, daytime sleepiness, hypersomnolence, bruxism, restless legs, hypnogognic hallucinations, no cataplexy Other comprehensive 14 point system review is negative.  PE BP 147/95 mmHg  Pulse 83  Ht '5\' 11"'  (1.803 m)  Wt 215 lb (97.523 kg)  BMI 30.00 kg/m2  Repeat blood pressure by me was 142/88.  Wt Readings from Last 3 Encounters:  10/23/15 215 lb (97.523 kg)  04/28/15 215 lb (97.523 kg)  03/31/15 217 lb 1.6 oz (98.476 kg)   General: Alert, oriented, no distress.  HEENT: Normocephalic, atraumatic. Pupils round and reactive; sclera anicteric; no xanthelasmas. Extraocular muscles are full. There is no lid lag. Nose without nasal septal hypertrophy Mouth/Parynx benign; Mallinpatti scale 3  Neck: Thick, no JVD, no carotid bruits; normal carotid upstroke Lungs: clear to ausculatation and percussion; no wheezing or rales Chest wall: No tenderness to palpation Heart: PMI not displaced.  Rhythm appears mildly irregular., s1 s2 normal 1/6 sem; no ectopy heard on auscultation Abdomen: Moderate central adiposity ;soft, nontender; no hepatosplenomehaly, BS+; abdominal aorta nontender and not dilated by palpation. Back: No CVA tenderness Pulses 2+ Extremities: Resolution of prior  bilateral ankle swelling, Homan's sign negative  Neurologic: grossly nonfocal; cranial nerves intact. Psychological: Normal affect and mood  ECG (independently read by me): Atrial flutter with variable block.  Right bundle-branch block with repolarization changes.  October 2016 ECG (independently read by me): sinus rhythm with first-degree AV block with a PR interval at 216 ms.  Ventricular rate 86 bpm with occasional PVCs.    May 2016ECG (independently read by me): Normal sinus rhythm at 60 bpm.  Right bundle-branch block with repolarization changes.  November 2015 ECG (independently read by me): Normal sinus rhythm at 61.  Nonspecific T abnormality.  QTc interval 394 ms.  11/18/2013 ECG (independently read by me): Sinus bradycardia 58 beats per minute.  PR interval 198 ms; QTc interval 371 ms.  Nonspecific ST changes.  07/29/2013 ECG  (independently read by me): Atrial flutter with 2:1 block with a ventricular rate of 87 beats per minute. Atrial rate is approximately 360 ms. 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 fib flutter now with right bundle branch block and repolarization changes.  Prior ECG of 06/04/2013: EKG  suggests probable atrial flutter with a ventricular rate of 105 beats per minute. There also are frequent PVCs. QTc interval is 436 ms. They're nonspecific T changes.  LABS: BMP Latest Ref Rng 04/28/2015 01/05/2015 09/29/2014  Glucose 70 - 99 mg/dL 95 99 97  BUN 7 - 25 mg/dL 30(H) 29(H) 33(H)  Creatinine 0.70 - 1.18 mg/dL 1.64(H) 1.92(H) 2.01(H)  Sodium 135 - 146 mmol/L 141 138 142  Potassium 3.5 - 5.3 mmol/L 4.9 4.8 4.7  Chloride 98 - 110 mmol/L 107 102 106  CO2 20 - 31 mmol/L '25 27 25  ' Calcium 8.6 - 10.3 mg/dL 9.1 9.7 9.6   Hepatic Function Latest Ref Rng 04/28/2015 01/05/2015 09/29/2014  Total Protein 6.1 - 8.1 g/dL 6.5 6.7 7.0  Albumin 3.6 - 5.1 g/dL 4.2 4.3 4.1  AST 10 - 35 U/L '16 18 18  ' ALT 9 - 46  U/L '21 25 23  ' Alk Phosphatase 40 - 115 U/L 39(L) 36(L) 33(L)  Total Bilirubin 0.2 - 1.2 mg/dL 1.1 1.2 1.0   CBC Latest Ref Rng 04/28/2015 01/05/2015 09/23/2014  WBC 4.0 - 10.5 K/uL 4.6 4.9 4.0  Hemoglobin 13.0 - 17.0 g/dL 14.1 14.4 13.7  Hematocrit 39.0 - 52.0 % 42.5 42.0 41.7  Platelets 150 - 400 K/uL 106(L) 97(L) 91(L)   Lab Results  Component Value Date   TSH 1.252 09/29/2014   Lipid Panel  Component Value Date/Time   CHOL 96* 04/28/2015 0827   TRIG 71 04/28/2015 0827   HDL 41 04/28/2015 0827   CHOLHDL 2.3 04/28/2015 0827   VLDL 14 04/28/2015 0827   LDLCALC 41 04/28/2015 0827   LDLDIRECT 65.0 04/12/2013 0825     RADIOLOGY: No results found.    ASSESSMENT AND PLAN: Martin Cordova is a 75 year old Caucasian male who has documented normal coronary arteries by heart catheterization in 2008. He has a history of hypertension, mixed hyperlipidemia, and has paroxysmal atrial fibrillation/flutter.  He has lost a significant amount of weight over the years and his weight has been fairly stable since his last visit.  With his significant weight loss.  He had developed slow pulses in the past leading to reduction of his medical regimen.  His ECG today now reveals atrial flutter with variable block, of which she was completely unaware.  He has been on regular strength Lia Hopping and known toward 25 mg and only twice a day dosing and I recommended further titration to every 8 hours.  In the short-term.  He will also increase his carvedilol to 18.75 mg twice a day.  He continues to be on eloquence for anticoagulation.  Hopefully his increased antiarrhythmic regimen and beta blocker therapy will pharmacologically convert him to sinus rhythm.  I will see him back in the office early next week for reevaluation and follow-up ECG on his increased regimen.  He continues to be on atorvastatin 10 mg for hyperlipidemia and is no longer on niacin, which was discontinued previously.  His last lipid panel in  November 2016 was excellent with an LDL of 41 and triglycerides 71.  Total cholesterol 96 on low-dose 10 mg atorvastatin.  He denies any GI symptoms on omeprazole.  His blood pressure today is stable.  He continues to take hydralazine twice a day.  I will see him next week for reevaluation with his increased medical regimen and further recommendations will be made at that time. Time spent: 25 minutes  Shelva Majestic, MD  10/23/2015  6:55 PM

## 2015-10-23 NOTE — Patient Instructions (Addendum)
Your physician has recommended you make the following change in your medication:   1.) the Rhyhmol has been increased to 1 tablet every 8 hours.  2.) the carvedilol has been changed to 1.5 tablets twice a day.  Your physician recommends that you return for lab work today.  Your physician recommends that you schedule a follow-up appointment in: Tuesday next week.

## 2015-10-25 ENCOUNTER — Other Ambulatory Visit: Payer: Self-pay | Admitting: Cardiovascular Disease

## 2015-10-26 ENCOUNTER — Ambulatory Visit (INDEPENDENT_AMBULATORY_CARE_PROVIDER_SITE_OTHER): Payer: Medicare Other | Admitting: Family Medicine

## 2015-10-26 ENCOUNTER — Encounter: Payer: Self-pay | Admitting: Family Medicine

## 2015-10-26 VITALS — BP 132/90 | HR 56 | Temp 97.6°F | Resp 18 | Wt 228.0 lb

## 2015-10-26 DIAGNOSIS — Z125 Encounter for screening for malignant neoplasm of prostate: Secondary | ICD-10-CM | POA: Diagnosis not present

## 2015-10-26 DIAGNOSIS — E1121 Type 2 diabetes mellitus with diabetic nephropathy: Secondary | ICD-10-CM

## 2015-10-26 DIAGNOSIS — E785 Hyperlipidemia, unspecified: Secondary | ICD-10-CM

## 2015-10-26 DIAGNOSIS — I480 Paroxysmal atrial fibrillation: Secondary | ICD-10-CM

## 2015-10-26 LAB — TSH: TSH: 2.4 mIU/L (ref 0.40–4.50)

## 2015-10-26 LAB — COMPLETE METABOLIC PANEL WITH GFR
ALT: 25 U/L (ref 9–46)
AST: 18 U/L (ref 10–35)
Albumin: 4.3 g/dL (ref 3.6–5.1)
Alkaline Phosphatase: 37 U/L — ABNORMAL LOW (ref 40–115)
BUN: 26 mg/dL — ABNORMAL HIGH (ref 7–25)
CO2: 25 mmol/L (ref 20–31)
Calcium: 9.1 mg/dL (ref 8.6–10.3)
Chloride: 103 mmol/L (ref 98–110)
Creat: 1.71 mg/dL — ABNORMAL HIGH (ref 0.70–1.18)
GFR, Est African American: 45 mL/min — ABNORMAL LOW (ref >=60)
GFR, Est Non African American: 39 mL/min — ABNORMAL LOW (ref >=60)
Glucose, Bld: 113 mg/dL — ABNORMAL HIGH (ref 70–99)
Potassium: 4.8 mmol/L (ref 3.5–5.3)
Sodium: 142 mmol/L (ref 135–146)
Total Bilirubin: 1.1 mg/dL (ref 0.2–1.2)
Total Protein: 6.7 g/dL (ref 6.1–8.1)

## 2015-10-26 LAB — LIPID PANEL
Cholesterol: 93 mg/dL — ABNORMAL LOW (ref 125–200)
HDL: 32 mg/dL — ABNORMAL LOW (ref >=40)
LDL Cholesterol: 35 mg/dL (ref <130)
Total CHOL/HDL Ratio: 2.9 Ratio (ref <=5.0)
Triglycerides: 130 mg/dL (ref <150)
VLDL: 26 mg/dL (ref <30)

## 2015-10-26 LAB — MAGNESIUM: Magnesium: 2 mg/dL (ref 1.5–2.5)

## 2015-10-26 NOTE — Telephone Encounter (Signed)
Rx(s) sent to pharmacy electronically.  

## 2015-10-26 NOTE — Progress Notes (Signed)
Subjective:    Patient ID: Martin Cordova, male    DOB: [birth year], 75 y.o.   MRN: [id number]  HPI 8/16 Patient continues to lose weight. I am very proud of him. Patient is off all diabetic medications now for over 3 months. Hemoglobin A1c has fallen from 6.1-5.9 even off medication. Most recent lab work continues to show dyslipidemia with an HDL cholesterol below 40. Creatinine is stable at 1.92. Platelet count remains low at 97. Patient is currently in atrial fibrillation by exam. Heart rate is well regulated at 62 bpm. Patient has been getting orthostatic dizziness at home. He states his blood pressure at home is ranging 110-120 over 60s. He continues to take eliquis 5 mg by mouth twice a day.  At that time, my plan was: Blood sugars are well controlled. Patient is up-to-date with all his immunizations including his pneumonia vaccines. I did recommend that he follow-up with his urologist for prostate exam. Also recommended that he follow-up with his cardiologist. Given his elevated creatinine, and his thrombocytopenia, I have asked the patient to discuss with his cardiologist possibly decreasing his dose about requests to 2.5 mg by mouth twice a day. I believe the patient is in somewhat of a gray zone and I will appreciate Dr. Evette Georges opinion on this. I will also check a urine microalbumin. Patient has a history of renal insufficiency made worse by the ACE inhibitor. If the patient has significantly elevated microalbuminuria, we may consider trying a low-dose angiotensin receptor blocker with close monitoring of his creatinine. I would be willing to accept a rise in his creatinine less than 30%. Hopefully his urine microalbumin will be normal.  04/28/15 Microalbumin was normal and therefore, I did not start an ARB for the reasons outlined above.  He is here for follow up.  His cardiologist elected to continue eliquis at 5 mg by mouth twice a day. His thrombocytopenia has not been checked since I  last saw him. He denies any chest pain shortness of breath or dyspnea on exertion. His weight remains stable at 215 pounds. He denies any hypoglycemic episodes. He denies any polyuria polydipsia or blurred vision. His blood pressure remained stable at 132/74.  At that time, my plan was: Blood pressures well controlled. I will check a hemoglobin A1c along with a fasting lipid panel. Goal LDL cholesterol is less than 100. Goal hemoglobin A1c is less than 6.5. If both of these values are at goal, I would recheck the patient in 6 months.  10/26/15 Here for follow up.  By our scales, the patient has gained some weight back. However he denies any polyuria, polydipsia, or blurred vision. He denies any burning or stinging in his feet. He denies any numbness in his feet. He recently saw Dr. Claiborne Billings and was found to be back in atrial fibrillation. At that point Dr. Claiborne Billings resumed his propafenone and also increase his carvedilol. This morning he is in normal sinus rhythm. Heart rate is 56 bpm. He denies any orthostatic dizziness, syncope, near syncope. He denies any chest pain or shortness of breath. Immunizations are up-to-date. He is due for a PSA. He has no risk factors for hepatitis C and therefore declines hepatitis C screening  Past Medical History  Diagnosis Date  . GERD 12/07/2006  . HYPERGLYCEMIA 12/07/2006  . HYPERLIPIDEMIA 12/07/2006    mixed wth an atherogenic dyslipidemic pattern  . HYPERTENSION 12/07/2006  . OBESITY 11/10/2009  . THROMBOCYTOPENIA 12/07/2006  . TRANSAMINASES, SERUM, ELEVATED 12/07/2006  .  SLEEP APNEA 10/22/2007    uses bipap  . 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 Claiborne Billings  . Diabetes mellitus without complication ([place])   . Renal insufficiency     Cr to 3 on ACE  . Nephrolithiasis   . Normal coronary arteries 04/06/2007    by cath EF 55%., last ECHO 10/13/10 EF>55% mild MR, last nuc 06/16/10 EF 57% low risk scan  . Pulmonary HTN (HCC)     RV syst.  40-51mmHg- moderate by echo    . Palpitations     tachy  . PAF (paroxysmal atrial fibrillation) ([place]) 04/09/2013  . Colon polyps   . Diverticulosis   . Nonalcoholic fatty liver disease    Past Surgical History  Procedure Laterality Date  . Cholecystectomy    . Rotator cuff repair Right   . Cystoscopy/retrograde/ureteroscopy  08/19/2011    Procedure: CYSTOSCOPY/RETROGRADE/URETEROSCOPY;  Surgeon: Hanley Ben, MD;  Location[facility]  Service: Urology;  Laterality: Right;  JJ STENT PLACEMENT   . Cardiac catheterization  04/06/2007    normal cors  . Cardioversion N/A 08/05/2013    Procedure: CARDIOVERSION;  Surgeon: Troy Sine, MD;  Location: [facility];  Service: Cardiovascular;  Laterality: N/A;   Current Outpatient Prescriptions on File Prior to Visit  Medication Sig Dispense Refill  . atorvastatin (LIPITOR) 10 MG tablet TAKE 1 TABLET BY MOUTH AT BEDTIME. 30 tablet 10  . carvedilol (COREG) 12.5 MG tablet Take 1.5 tablets (18.75 mg total) by mouth 2 (two) times daily with a meal. 90 tablet 6  . ELIQUIS 5 MG TABS tablet TAKE 1 TABLET TWICE A DAY 60 tablet 5  . hydrALAZINE (APRESOLINE) 25 MG tablet TAKE 1 TABLET (25 MG TOTAL) BY MOUTH 2 (TWO) TIMES DAILY. 60 tablet 4  . NON FORMULARY BIPAP therapy    . omega-3 acid ethyl esters (LOVAZA) 1 G capsule TAKE 2 CAPSULES BY MOUTH EVERY DAY 60 capsule 5  . omeprazole (PRILOSEC) 20 MG capsule TAKE 1 CAPSULE (20 MG TOTAL) BY MOUTH DAILY. 30 capsule 11  . potassium citrate (UROCIT-K) 10 MEQ (1080 MG) SR tablet Take 10 mEq by mouth 3 (three) times daily with meals.     . propafenone (RYTHMOL) 225 MG tablet Take 1 tablet (225 mg total) by mouth every 8 (eight) hours. 90 tablet 6  . sildenafil (VIAGRA) 100 MG tablet Take 0.5-1 tablets (50-100 mg total) by mouth daily as needed for erectile dysfunction. 5 tablet 11   No current facility-administered medications on file prior to visit.   Allergies  Allergen Reactions  . Lisinopril Swelling    Renal  insufficiency   Social History   Social History  . Marital Status: Married    Spouse Name: N/A  . Number of Children: 4  . Years of Education: N/A   Occupational History  . retired Pharmacist, hospital    Social History Main Topics  . Smoking status: Former Smoker -- 10 years    Types: Cigarettes    Quit date: 08/04/1980  . Smokeless tobacco: Former Systems developer    Types: Chew    Quit date: 08/08/2004  . Alcohol Use: 0.0 oz/week    0 Standard drinks or equivalent per week     Comment: 1 beer 2-3 times per month  . Drug Use: No  . Sexual Activity: Yes   Other Topics Concern  . Not on file   Social History Narrative     Review of Systems  All other systems reviewed and  are negative.      Objective:   Physical Exam  Constitutional: He is oriented to person, place, and time. He appears well-developed and well-nourished. No distress.  Neck: No JVD present. No thyromegaly present.  Cardiovascular: Normal rate, regular rhythm, normal heart sounds and intact distal pulses.   No murmur heard. Pulmonary/Chest: Effort normal and breath sounds normal. No respiratory distress. He has no wheezes. He has no rales.  Abdominal: Soft. Bowel sounds are normal. He exhibits no distension and no mass. There is no tenderness. There is no rebound and no guarding.  Musculoskeletal: He exhibits no edema.  Lymphadenopathy:    He has no cervical adenopathy.  Neurological: He is alert and oriented to person, place, and time. He has normal reflexes. No cranial nerve deficit. He exhibits normal muscle tone. Coordination normal.  Skin: He is not diaphoretic.  Vitals reviewed.  Wt Readings from Last 3 Encounters:  10/23/15 215 lb (97.523 kg)  04/28/15 215 lb (97.523 kg)  03/31/15 217 lb 1.6 oz (98.476 kg)          Assessment & Plan:  Controlled type 2 diabetes mellitus with diabetic nephropathy, without long-term current use of insulin ([place]) - Plan: Magnesium, CBC with Differential/Platelet, COMPLETE  METABOLIC PANEL WITH GFR, Microalbumin, urine, Lipid panel, Hemoglobin A1c  PAF (paroxysmal atrial fibrillation) (HCC) - Plan: Magnesium, TSH  Dyslipidemia  Prostate cancer screening - Plan: PSA  Patient is back in sinus rhythm this morning. Heart rate is controlled with no symptoms of dizziness. His blood pressure is acceptable. I will check a fasting lipid panel along with a hemoglobin A1c and a urine microalbumin. His cardiologist has also requested a TSH and a magnesium and I will gladly check this. Recheck in 6 months assuming his lab work is excellent. I will also draw a PSA while we are checking lab work. Continue work on diet and exercise and weight loss.

## 2015-10-27 LAB — CBC WITH DIFFERENTIAL/PLATELET
Basophils Absolute: 0 cells/uL (ref 0–200)
Basophils Relative: 0 %
Eosinophils Absolute: 104 cells/uL (ref 15–500)
Eosinophils Relative: 2 %
HCT: 43.4 % (ref 38.5–50.0)
Hemoglobin: 14 g/dL (ref 13.0–17.0)
Lymphocytes Relative: 15 %
Lymphs Abs: 780 cells/uL — ABNORMAL LOW (ref 850–3900)
MCH: 30 pg (ref 27.0–33.0)
MCHC: 32.3 g/dL (ref 32.0–36.0)
MCV: 93.1 fL (ref 80.0–100.0)
MPV: 12.5 fL (ref 7.5–12.5)
Monocytes Absolute: 520 cells/uL (ref 200–950)
Monocytes Relative: 10 %
Neutro Abs: 3796 cells/uL (ref 1500–7800)
Neutrophils Relative %: 73 %
Platelets: 95 10*3/uL — ABNORMAL LOW (ref 140–400)
RBC: 4.66 MIL/uL (ref 4.20–5.80)
RDW: 13.9 % (ref 11.0–15.0)
WBC: 5.2 10*3/uL (ref 3.8–10.8)

## 2015-10-27 LAB — HEMOGLOBIN A1C
Hgb A1c MFr Bld: 6 % — ABNORMAL HIGH (ref <5.7)
Mean Plasma Glucose: 126 mg/dL

## 2015-10-27 LAB — PSA: PSA: 1.33 ng/mL (ref <=4.00)

## 2015-10-27 LAB — MICROALBUMIN, URINE: Microalb, Ur: 2.8 mg/dL

## 2015-10-28 ENCOUNTER — Encounter: Payer: Self-pay | Admitting: Family Medicine

## 2015-10-28 ENCOUNTER — Encounter: Payer: Self-pay | Admitting: Cardiovascular Disease

## 2015-10-28 ENCOUNTER — Ambulatory Visit (INDEPENDENT_AMBULATORY_CARE_PROVIDER_SITE_OTHER): Payer: Medicare Other | Admitting: Cardiovascular Disease

## 2015-10-28 VITALS — BP 134/84 | HR 54 | Ht 71.0 in | Wt 226.4 lb

## 2015-10-28 DIAGNOSIS — D696 Thrombocytopenia, unspecified: Secondary | ICD-10-CM | POA: Diagnosis not present

## 2015-10-28 DIAGNOSIS — I10 Essential (primary) hypertension: Secondary | ICD-10-CM | POA: Diagnosis not present

## 2015-10-28 DIAGNOSIS — N289 Disorder of kidney and ureter, unspecified: Secondary | ICD-10-CM | POA: Diagnosis not present

## 2015-10-28 DIAGNOSIS — Z7901 Long term (current) use of anticoagulants: Secondary | ICD-10-CM

## 2015-10-28 DIAGNOSIS — I4892 Unspecified atrial flutter: Secondary | ICD-10-CM

## 2015-10-28 NOTE — Progress Notes (Signed)
Patient ID: Martin Cordova, male   DOB: [birth year], 75 y.o.   MRN: [id number]      HPI: Martin Cordova is a 75 y.o. male is who presents to the office today for a follow-up cardiology  Evaluation after being found to have recurrent atrial flutter last week.  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 In the past, he developed an obstructive uropathy attributed to kidney stones; creatinine had risen up to 3 and ultimately improved and stabilized at approximately 1.7. Martin Cordova uses his BiPAP daily and does note good sleep.  In the past he had issues with mild peripheral edema, which ultimately improved.  He had been previously taken off his lisinopril when his creatinine had risen in the setting of his obstructive uropathy.  In 2015 he developed recurrent episodes of palpitations and was found to have recurrent paroxysmal atrial fibrillation/flutter.  His medications were adjusted and  he was started on antiarrhythmic therapy with Rythmol to take in addition to his increasing doses of carvedilol.  He was started on Eliquis for anticoagulation.  He underwent successful cardioversion on 08/05/2013.  Since that time, he is unaware of any recurrent atrial fibrillation.  He has noticed more energy. He had been maintained on 50 mg twice a day of carvedilol in addition to Rythmol 225 mg every 8 hours, but due to slow pulse rates, these doses have ultimately been reduced.  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 He has been on CPAP therapy initially for approximately 8 years and has been on BiPAP auto SV for over 4 years. He admits to using his BiPAP with 100% compliance.  He obtained a new BiPAP  machine in June.  He feels that his machine is significantly improved from his prior one. When I last saw him I reviewed his  download of his new BiPAP auto SV machine indicates that 90% of the time is EPAP pressure was 17 and 90% of the time his pressure support was 5.8, giving an IPAP of 21.8 cm.  He is using it 100% of the time and is averaging 5 hours and 49 minutes on his most recent download from 12/07/2013 through 01/05/2014.  His average AHI was 7.5.  His device setting maximum BiPAP pressure is 25 cm in maximum EPAP pressure 21 cm.  His average central event index is 1.3, and average obstructive apneic index 0.2, with an average copy index of 6.0.  Average breath.  Rate is 18 breaths per minute with a minute ventilation of 12.1.  Average total body may 670 mL.  When I saw him last week he was unaware of any arrhythmia.  He now had been on carvedilol 12.5 mg twice a day, Rythmol 225 mg twice a day, hydralazine 25 mg twice a day and eliquis  5 mg twice a day.  He also has been taking omeprazole for GERD and atorvastatin 10 mg for hyperlipidemia.  His ECG last week showed atrial flutter with variable block.  At that time, I recommended that he increase his Rythmol and take 225 mg every 8 hours and further increased his carvedilol to 18.75 mg twice a day.  Laboratory had revealed a magnesium  of 2.0.  He has stage III chronic kidney disease and his BUN was 26, creatinine 1.71 with a GFR estimate of 39.  Potassium was 4.8.  TSH was normal at 2.4.  He presents to the office today for follow-up evaluation.  Past Medical History  Diagnosis Date  . GERD 12/07/2006  . HYPERGLYCEMIA 12/07/2006  . HYPERLIPIDEMIA 12/07/2006    mixed wth an atherogenic dyslipidemic pattern  . HYPERTENSION 12/07/2006  . OBESITY 11/10/2009  . THROMBOCYTOPENIA 12/07/2006  . TRANSAMINASES, SERUM, ELEVATED 12/07/2006  . SLEEP APNEA 10/22/2007    uses bipap  . 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 Claiborne Billings  . Diabetes  mellitus without complication   . Renal insufficiency     Cr to 3 on ACE  . Nephrolithiasis   . Normal coronary arteries 04/06/2007    by cath EF 55%., last ECHO 10/13/10 EF>55% mild MR, last nuc 06/16/10 EF 57% low risk scan  . Pulmonary HTN     RV syst.  40-74mHg- moderate by echo  . Palpitations     tachy  . PAF (paroxysmal atrial fibrillation) 04/09/2013    Past Surgical History  Procedure Laterality Date  . Cholecystectomy    . Rotator cuff repair    . Cystoscopy/retrograde/ureteroscopy  08/19/2011    Procedure: CYSTOSCOPY/RETROGRADE/URETEROSCOPY;  Surgeon: MHanley Ben MD;  [facility]  Service: Urology;  Laterality: Right;  JJ STENT PLACEMENT   . Cardiac catheterization  04/06/2007    normal cors    Allergies  Allergen Reactions  . Lisinopril     Renal insufficiency    Current outpatient prescriptions:  .  atorvastatin (LIPITOR) 10 MG tablet, TAKE 1 TABLET BY MOUTH AT BEDTIME., Disp: 30 tablet, Rfl: 10 .  carvedilol (COREG) 12.5 MG tablet, Take 1.5 tablets (18.75 mg total) by mouth 2 (two) times daily with a meal., Disp: 90 tablet, Rfl: 6 .  ELIQUIS 5 MG TABS tablet, TAKE 1 TABLET TWICE A DAY, Disp: 60 tablet, Rfl: 5 .  hydrALAZINE (APRESOLINE) 25 MG tablet, TAKE 1 TABLET (25 MG TOTAL) BY MOUTH 2 (TWO) TIMES DAILY., Disp: 60 tablet, Rfl: 4 .  NON FORMULARY, BIPAP therapy, Disp: , Rfl:  .  omega-3 acid ethyl esters (LOVAZA) 1 g capsule, TAKE 2 CAPSULES BY MOUTH EVERY DAY, Disp: 60 capsule, Rfl: 10 .  omeprazole (PRILOSEC) 20 MG capsule, TAKE 1 CAPSULE (20 MG TOTAL) BY MOUTH DAILY., Disp: 30 capsule, Rfl: 11 .  potassium citrate (UROCIT-K) 10 MEQ (1080 MG) SR tablet, Take 10 mEq by mouth 3 (three) times daily with meals. , Disp: , Rfl:  .  propafenone (RYTHMOL) 225 MG tablet, Take 1 tablet (225 mg total) by mouth every 8 (eight) hours., Disp: 90 tablet, Rfl: 6 .  sildenafil (VIAGRA) 100 MG tablet, Take 0.5-1 tablets (50-100 mg total) by mouth  daily as needed for erectile dysfunction., Disp: 5 tablet, Rfl: 11  Socially he is married has 4 children and 2 grandchildren. He is a distant relative to the "Bosler brothers." He does try to walk and exercise. There is no tobacco use. He does drink occasional alcohol.   ROS General: Negative; No fevers, chills, or night sweats;  HEENT: Negative; No changes in vision or hearing, sinus congestion, difficulty swallowing Pulmonary: Negative; No cough, wheezing, shortness of breath, hemoptysis Cardiovascular: Negative; No chest pain, presyncope, syncope, palpatations No recent peripheral edema GI: Negative; No nausea, vomiting, diarrhea, or abdominal pain GU: Renal function has stabilized; No dysuria, hematuria, or  difficulty voiding; some difficulty with erectile function Musculoskeletal: Negative; no myalgias, joint pain, or weakness Hematologic/Oncology: Negative; no easy bruising, bleeding Endocrine: Negative; no heat/cold intolerance; no diabetes Neuro: Negative; no changes in balance, headaches Skin: Negative; No rashes or skin lesions Psychiatric: Negative; No behavioral problems, depression Sleep: He is using his BiPAP therapy with 100% compliance.  No snoring, daytime sleepiness, hypersomnolence, bruxism, restless legs, hypnogognic hallucinations, no cataplexy Other comprehensive 14 point system review is negative.  PE BP 134/84 mmHg  Pulse 54  Ht '5\' 11"'  (1.803 m)  Wt 226 lb 6.4 oz (102.694 kg)  BMI 31.59 kg/m2  Repeat blood pressure by me was 138/82  Wt Readings from Last 3 Encounters:  10/28/15 226 lb 6.4 oz (102.694 kg)  10/26/15 228 lb (103.42 kg)  10/23/15 215 lb (97.523 kg)   General: Alert, oriented, no distress.  HEENT: Normocephalic, atraumatic. Pupils round and reactive; sclera anicteric; no xanthelasmas. Extraocular muscles are full. There is no lid lag. Nose without nasal septal hypertrophy Mouth/Parynx benign; Mallinpatti scale 3  Neck: Thick, no JVD, no  carotid bruits; normal carotid upstroke Lungs: clear to ausculatation and percussion; no wheezing or rales Chest wall: No tenderness to palpation Heart: PMI not displaced.  Rhythm today is regular., s1 s2 normal 1/6 sem; no ectopy heard on auscultation Abdomen: Moderate central adiposity ;soft, nontender; no hepatosplenomehaly, BS+; abdominal aorta nontender and not dilated by palpation. Back: No CVA tenderness Pulses 2+ Extremities: Resolution of prior bilateral ankle swelling, Homan's sign negative  Neurologic: grossly nonfocal; cranial nerves intact. Psychological: Normal affect and mood  ECG (independently read by me): Sinus bradycardia at 54 bpm.  First degree block with PR interval 222 ms.  Right bundle-branch block.  05/17/2017ECG (independently read by me): Atrial flutter with variable block.  Right bundle-branch block with repolarization changes.  October 2016 ECG (independently read by me): sinus rhythm with first-degree AV block with a PR interval at 216 ms.  Ventricular rate 86 bpm with occasional PVCs.    May 2016ECG (independently read by me): Normal sinus rhythm at 60 bpm.  Right bundle-branch block with repolarization changes.  November 2015 ECG (independently read by me): Normal sinus rhythm at 61.  Nonspecific T abnormality.  QTc interval 394 ms.  11/18/2013 ECG (independently read by me): Sinus bradycardia 58 beats per minute.  PR interval 198 ms; QTc interval 371 ms.  Nonspecific ST changes.  07/29/2013 ECG  (independently read by me): Atrial flutter with 2:1 block with a ventricular rate of 87 beats per minute. Atrial rate is approximately 360 ms. 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 fib flutter now with right bundle branch block and repolarization changes.  Prior ECG of 06/04/2013: EKG  suggests probable atrial flutter with a ventricular rate of 105 beats per minute. There also are frequent PVCs.  QTc interval is 436 ms. They're nonspecific T changes.  LABS: BMP Latest Ref Rng 10/26/2015 04/28/2015 01/05/2015  Glucose 70 - 99 mg/dL 113(H) 95 99  BUN 7 - 25 mg/dL 26(H) 30(H) 29(H)  Creatinine 0.70 - 1.18 mg/dL 1.71(H) 1.64(H) 1.92(H)  Sodium 135 - 146 mmol/L 142 141 138  Potassium 3.5 - 5.3 mmol/L 4.8 4.9 4.8  Chloride 98 - 110 mmol/L 103 107 102  CO2 20 - 31 mmol/L '25 25 27  ' Calcium 8.6 - 10.3 mg/dL 9.1 9.1 9.7   Hepatic Function Latest Ref Rng 10/26/2015 04/28/2015 01/05/2015  Total Protein 6.1 - 8.1 g/dL 6.7  6.5 6.7  Albumin 3.6 - 5.1 g/dL 4.3 4.2 4.3  AST 10 - 35 U/L '18 16 18  ' ALT 9 - 46 U/L '25 21 25  ' Alk Phosphatase 40 - 115 U/L 37(L) 39(L) 36(L)  Total Bilirubin 0.2 - 1.2 mg/dL 1.1 1.1 1.2   CBC Latest Ref Rng 10/26/2015 04/28/2015 01/05/2015  WBC 3.8 - 10.8 K/uL 5.2 4.6 4.9  Hemoglobin 13.0 - 17.0 g/dL 14.0 14.1 14.4  Hematocrit 38.5 - 50.0 % 43.4 42.5 42.0  Platelets 140 - 400 K/uL 95(L) 106(L) 97(L)   Lab Results  Component Value Date   TSH 2.40 10/26/2015   Lipid Panel     Component Value Date/Time   CHOL 93* 10/26/2015 0819   TRIG 130 10/26/2015 0819   HDL 32* 10/26/2015 0819   CHOLHDL 2.9 10/26/2015 0819   VLDL 26 10/26/2015 0819   LDLCALC 35 10/26/2015 0819   LDLDIRECT 65.0 04/12/2013 0825     RADIOLOGY: No results found.    ASSESSMENT AND PLAN: Martin Cordova is a 75 year old Caucasian male who has documented normal coronary arteries by heart catheterization in 2008. He has a history of hypertension, mixed hyperlipidemia, and has paroxysmal atrial fibrillation/flutter.  He has lost a significant amount of weight over the years and his weight has been fairly stable since his last visit.  With his significant weight loss.  He had developed slow pulses in the past leading to reduction of his medical regimen. When I saw him last week he was unaware that he was back in atrial flutter and had variable block.  He had been on a reduced dose of Rythmol at only 225 mg  every 12 hours and was on carvedilol 12.5 mg twice a day.  I further titrated his Rythmol to 225 g every 8 hours and increased his carvedilol to 18.75 mg twice a day.  His ECG today confirms that he is reverted back to sinus rhythm.  He has sinus bradycardia but is tolerating this with pulse at 54.  I recommended that if his heart rate drops below 50  he will decrease the carvedilol back to 12.5 mg twice a day, but he will continue taking the Rythmol 225 mg every 8 hours.  His blood pressure today is stable and he continues to tolerate hydralazine.  He continues to be on eliquis 5 mg twice a day and denies bleeding.  However, I have reviewed his recent blood work.  He has persistently had thrombocytopenia and has never been evaluated by hematology.  Upon further questioning,  his mother had ITP.   His platelets count 2 days ago was 95,000 and with his need for chronic anticoagulation, especially since he has developed recurrent arrhythmia  I have recommended a referral for hematology evaluation.  He will continue to monitor his heart rate and blood pressure.  As long as he is stable, cardiovascular, I will see him in 3 months for reevaluation.    Shelva Majestic, MD  10/28/2015  1:35 PM

## 2015-10-28 NOTE — Patient Instructions (Signed)
Your physician has recommended you make the following change in your medication:   1.) the carvedilol may be reduced to 12.5 mg twice a day if your heart rate drop and stays below 54. Otherwise continue your current dose.  You have been referred to hemetology to evaluate your thrombocytopenia.  Your physician recommends that you schedule a follow-up appointment in: 4 months.

## 2015-11-05 ENCOUNTER — Encounter: Payer: Self-pay | Admitting: Hematology

## 2015-11-05 ENCOUNTER — Telehealth: Payer: Self-pay | Admitting: Hematology

## 2015-11-05 NOTE — Telephone Encounter (Signed)
Verified address and insurance, in basket referring provider date/time, mailed new pt packet

## 2015-11-16 ENCOUNTER — Ambulatory Visit (HOSPITAL_BASED_OUTPATIENT_CLINIC_OR_DEPARTMENT_OTHER): Payer: Medicare Other

## 2015-11-16 ENCOUNTER — Telehealth: Payer: Self-pay | Admitting: Hematology

## 2015-11-16 ENCOUNTER — Ambulatory Visit (HOSPITAL_BASED_OUTPATIENT_CLINIC_OR_DEPARTMENT_OTHER): Payer: Medicare Other | Admitting: Hematology

## 2015-11-16 ENCOUNTER — Encounter: Payer: Self-pay | Admitting: Hematology

## 2015-11-16 VITALS — BP 165/70 | HR 56 | Temp 97.9°F | Resp 18 | Ht 71.0 in | Wt 226.9 lb

## 2015-11-16 DIAGNOSIS — I4891 Unspecified atrial fibrillation: Secondary | ICD-10-CM | POA: Diagnosis not present

## 2015-11-16 DIAGNOSIS — K7581 Nonalcoholic steatohepatitis (NASH): Secondary | ICD-10-CM

## 2015-11-16 DIAGNOSIS — E119 Type 2 diabetes mellitus without complications: Secondary | ICD-10-CM | POA: Diagnosis not present

## 2015-11-16 DIAGNOSIS — Z87891 Personal history of nicotine dependence: Secondary | ICD-10-CM | POA: Diagnosis not present

## 2015-11-16 DIAGNOSIS — I10 Essential (primary) hypertension: Secondary | ICD-10-CM

## 2015-11-16 DIAGNOSIS — Z809 Family history of malignant neoplasm, unspecified: Secondary | ICD-10-CM | POA: Diagnosis not present

## 2015-11-16 DIAGNOSIS — D696 Thrombocytopenia, unspecified: Secondary | ICD-10-CM | POA: Diagnosis not present

## 2015-11-16 LAB — COMPREHENSIVE METABOLIC PANEL
ALT: 24 U/L (ref 0–55)
AST: 17 U/L (ref 5–34)
Albumin: 3.8 g/dL (ref 3.5–5.0)
Alkaline Phosphatase: 38 U/L — ABNORMAL LOW (ref 40–150)
Anion Gap: 4 mEq/L (ref 3–11)
BUN: 22.6 mg/dL (ref 7.0–26.0)
CO2: 30 mEq/L — ABNORMAL HIGH (ref 22–29)
Calcium: 9.5 mg/dL (ref 8.4–10.4)
Chloride: 109 mEq/L (ref 98–109)
Creatinine: 1.7 mg/dL — ABNORMAL HIGH (ref 0.7–1.3)
EGFR: 40 mL/min/{1.73_m2} — ABNORMAL LOW (ref >90)
Glucose: 85 mg/dl (ref 70–140)
Potassium: 4.8 mEq/L (ref 3.5–5.1)
Sodium: 143 mEq/L (ref 136–145)
Total Bilirubin: 0.88 mg/dL (ref 0.20–1.20)
Total Protein: 7 g/dL (ref 6.4–8.3)

## 2015-11-16 LAB — CBC & DIFF AND RETIC
BASO%: 0.4 % (ref 0.0–2.0)
Basophils Absolute: 0 10*3/uL (ref 0.0–0.1)
EOS%: 2.3 % (ref 0.0–7.0)
Eosinophils Absolute: 0.1 10*3/uL (ref 0.0–0.5)
HCT: 41.2 % (ref 38.4–49.9)
HGB: 14 g/dL (ref 13.0–17.1)
Immature Retic Fract: 5.2 % (ref 3.00–10.60)
LYMPH%: 21.3 % (ref 14.0–49.0)
MCH: 31.1 pg (ref 27.2–33.4)
MCHC: 34 g/dL (ref 32.0–36.0)
MCV: 91.6 fL (ref 79.3–98.0)
MONO#: 0.5 10*3/uL (ref 0.1–0.9)
MONO%: 10 % (ref 0.0–14.0)
NEUT#: 3.2 10*3/uL (ref 1.5–6.5)
NEUT%: 66 % (ref 39.0–75.0)
Platelets: 100 10*3/uL — ABNORMAL LOW (ref 140–400)
RBC: 4.5 10*6/uL (ref 4.20–5.82)
RDW: 13.2 % (ref 11.0–14.6)
Retic %: 1.4 % (ref 0.80–1.80)
Retic Ct Abs: 63 10*3/uL (ref 34.80–93.90)
WBC: 4.8 10*3/uL (ref 4.0–10.3)
lymph#: 1 10*3/uL (ref 0.9–3.3)

## 2015-11-16 LAB — CHCC SMEAR

## 2015-11-16 NOTE — Telephone Encounter (Signed)
per pof to sch pt appt-gave pt ocpy of avs-sent back to lab-[facility] will call to sch scan

## 2015-11-16 NOTE — Progress Notes (Signed)
Marland Kitchen    HEMATOLOGY/ONCOLOGY CONSULTATION NOTE  Date of Service: 11/16/2015  Patient Care Team: Susy Frizzle, MD as PCP - General (Family Medicine)  CHIEF COMPLAINTS/PURPOSE OF CONSULTATION:  Thrombocytopenia  HISTORY OF PRESENTING ILLNESS:   Martin Cordova is a wonderful 75 y.o. male who has been referred to [place] by Dr Claiborne Billings for evaluation and management of thrombocytopenia.  Patient has a h/o HTN, HLD, DM2, sleep apnea, GERD, CAD, obesity and NASH who was referred for evaluation of thrombocytopenia.  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  Hgb and WBC count have been WNL. No issues with bleeding or bruising, No recent new medications. He is on chronic PPI therapy. He is chronic anticoagulation for his atrial fibrillation.  No other acute new concerns.   MEDICAL HISTORY:  Past Medical History  Diagnosis Date  . GERD 12/07/2006  . HYPERGLYCEMIA 12/07/2006  . HYPERLIPIDEMIA 12/07/2006    mixed wth an atherogenic dyslipidemic pattern  . HYPERTENSION 12/07/2006  . OBESITY 11/10/2009  . THROMBOCYTOPENIA 12/07/2006  . TRANSAMINASES, SERUM, ELEVATED 12/07/2006  . SLEEP APNEA 10/22/2007    uses bipap  . 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 Claiborne Billings  . Diabetes mellitus without complication ([place])   . Renal insufficiency     Cr to 3 on ACE  . Nephrolithiasis   . Normal coronary arteries 04/06/2007    by cath EF 55%., last ECHO 10/13/10 EF>55% mild MR, last nuc 06/16/10 EF 57% low risk scan  . Pulmonary HTN (HCC)     RV syst.  40-27mHg- moderate by echo  . Palpitations     tachy  . [facility] [facility] [place] 04/[place]  . Colon polyps   . Diverticulosis   . Nonalcoholic fatty liver disease     SURGICAL HISTORY: Past Surgical History  Procedure Laterality Date  . Cholecystectomy    . Rotator cuff repair Right   . Cystoscopy/retrograde/ureteroscopy  08/19/2011    Procedure:  CYSTOSCOPY/RETROGRADE/URETEROSCOPY;  Surgeon: MHanley Ben MD;  [facility]  Service: Urology;  Laterality: Right;  JJ STENT PLACEMENT   . Cardiac catheterization  04/06/2007    normal cors  . Cardioversion N/A 08/05/2013    Procedure: CARDIOVERSION;  Surgeon: TTroy Sine MD;  [facility];  Service: Cardiovascular;  Laterality: N/A;    SOCIAL HISTORY: Social History   Social History  . Marital Status: Married    Spouse Name: N/A  . Number of Children: 4  . Years of Education: N/A   Occupational History  . retired tPharmacist, hospital   Social History Main Topics  . Smoking status: Former Smoker -- 10 years    Types: Cigarettes    Quit date: 08/04/1980  . Smokeless tobacco: Former [employer] developer   Types: Chew    Quit date: 08/08/2004  . Alcohol Use: 0.0 oz/week    0 Standard drinks or equivalent per week     Comment: 1 beer 2-3 times per month  . Drug Use: No  . Sexual Activity: Yes   Other Topics Concern  . Not on file   Social History Narrative    FAMILY HISTORY: Family History  Problem Relation Age of Onset  . Diabetes Mother   . Heart failure Mother   . Stroke Mother   . Kidney failure Father   . Diabetes Father   . Cancer Paternal Uncle     ALLERGIES:  is allergic  to lisinopril.  MEDICATIONS:  Current Outpatient Prescriptions  Medication Sig Dispense Refill  . atorvastatin (LIPITOR) 10 MG tablet TAKE 1 TABLET BY MOUTH AT BEDTIME. 30 tablet 10  . carvedilol (COREG) 12.5 MG tablet Take 1.5 tablets (18.75 mg total) by mouth 2 (two) times daily with a meal. 90 tablet 6  . ELIQUIS 5 MG TABS tablet TAKE 1 TABLET TWICE A DAY 60 tablet 5  . hydrALAZINE (APRESOLINE) 25 MG tablet TAKE 1 TABLET (25 MG TOTAL) BY MOUTH 2 (TWO) TIMES DAILY. 60 tablet 4  . NON FORMULARY BIPAP therapy    . omega-3 acid ethyl esters (LOVAZA) 1 g capsule TAKE 2 CAPSULES BY MOUTH EVERY DAY 60 capsule 10  . omeprazole (PRILOSEC) 20 MG capsule TAKE 1 CAPSULE (20 MG  TOTAL) BY MOUTH DAILY. 30 capsule 11  . potassium citrate (UROCIT-K) 10 MEQ (1080 MG) SR tablet Take 10 mEq by mouth 3 (three) times daily with meals.     . propafenone (RYTHMOL) 225 MG tablet Take 1 tablet (225 mg total) by mouth every 8 (eight) hours. 90 tablet 6  . sildenafil (VIAGRA) 100 MG tablet Take 0.5-1 tablets (50-100 mg total) by mouth daily as needed for erectile dysfunction. 5 tablet 11   No current facility-administered medications for this visit.    REVIEW OF SYSTEMS:    10 Point review of Systems was done is negative except as noted above.  PHYSICAL EXAMINATION: ECOG PERFORMANCE STATUS: 1 - Symptomatic but completely ambulatory  . Filed Vitals:   11/16/15 1054  BP: 165/70  Pulse: 56  Temp: 97.9 F (36.6 C)  Resp: 18   Filed Weights   11/16/15 1054  Weight: 226 lb 14.4 oz (102.921 kg)   .Body mass index is 31.66 kg/(m^2).  GENERAL:alert, in no acute distress and comfortable SKIN: skin color, texture, turgor are normal, no rashes or significant lesions EYES: normal, conjunctiva are pink and non-injected, sclera clear OROPHARYNX:no exudate, no erythema and lips, buccal mucosa, and tongue normal  NECK: supple, no JVD, thyroid normal size, non-tender, without nodularity LYMPH:  no palpable lymphadenopathy in the cervical, axillary or inguinal LUNGS: clear to auscultation with normal respiratory effort HEART: regular rate & rhythm,  no murmurs and no lower extremity edema ABDOMEN: abdomen soft, non-tender, normoactive bowel sounds  Musculoskeletal: no cyanosis of digits and no clubbing  PSYCH: alert & oriented x 3 with fluent speech NEURO: no focal motor/sensory deficits  LABORATORY DATA:  I have reviewed the data as listed  Component     Latest Ref Rng & Units 11/16/2015  WBC     4.0 - 10.3 10e3/uL 4.8  NEUT#     1.5 - 6.5 10e3/uL 3.2  Hemoglobin     13.0 - 17.1 g/dL 14.0  HCT     38.4 - 49.9 % 41.2  Platelets     140 - 400 10e3/uL 100 (L)  MCV      79.3 - 98.0 fL 91.6  MCH     27.2 - 33.4 pg 31.1  MCHC     32.0 - 36.0 g/dL 34.0  RBC     4.20 - 5.82 10e6/uL 4.50  RDW     11.0 - 14.6 % 13.2  lymph#     0.9 - 3.3 10e3/uL 1.0  MONO#     0.1 - 0.9 10e3/uL 0.5  Eosinophils Absolute     0.0 - 0.5 10e3/uL 0.1  Basophils Absolute     0.0 - 0.1 10e3/uL 0.0  NEUT%  39.0 - 75.0 % 66.0  LYMPH%     14.0 - 49.0 % 21.3  MONO%     0.0 - 14.0 % 10.0  EOS%     0.0 - 7.0 % 2.3  BASO%     0.0 - 2.0 % 0.4  Retic %     0.80 - 1.80 % 1.40  Retic Ct Abs     34.80 - 93.90 10e3/uL 63.00  Immature Retic Fract     3.00 - 10.60 % 5.20  Sodium     136 - 145 mEq/L 143  Potassium     3.5 - 5.1 mEq/L 4.8  Chloride     98 - 109 mEq/L 109  CO2     22 - 29 mEq/L 30 (H)  Glucose     70 - 140 mg/dl 85  BUN     7.0 - 26.0 mg/dL 22.6  Creatinine     0.7 - 1.3 mg/dL 1.7 (H)  Total Bilirubin     0.20 - 1.20 mg/dL 0.88  Alkaline Phosphatase     40 - 150 U/L 38 (L)  AST     5 - 34 U/L 17  ALT     0 - 55 U/L 24  Total Protein     6.4 - 8.3 g/dL 7.0  Albumin     3.5 - 5.0 g/dL 3.8  Calcium     8.4 - 10.4 mg/dL 9.5  Anion gap     3 - 11 mEq/L 4  EGFR     >90 ml/min/1.73 m2 40 (L)  HCV Ab     0.0 - 0.9 s/co ratio <0.1  Comment      Comment  HIV     Non Reactive Non Reactive  Vitamin B12     211 - 946 pg/mL 583   . CBC Latest Ref Rng 10/26/2015 04/28/2015 01/05/2015  WBC 3.8 - 10.8 K/uL 5.2 4.6 4.9  Hemoglobin 13.0 - 17.0 g/dL 14.0 14.1 14.4  Hematocrit 38.5 - 50.0 % 43.4 42.5 42.0  Platelets 140 - 400 K/uL 95(L) 106(L) 97(L)   . CBC    Component Value Date/Time   WBC 5.2 10/26/2015 0819   RBC 4.66 10/26/2015 0819   HGB 14.0 10/26/2015 0819   HCT 43.4 10/26/2015 0819   PLT 95* 10/26/2015 0819   MCV 93.1 10/26/2015 0819   MCH 30.0 10/26/2015 0819   MCHC 32.3 10/26/2015 0819   RDW 13.9 10/26/2015 0819   LYMPHSABS 780* 10/26/2015 0819   MONOABS 520 10/26/2015 0819   EOSABS 104 10/26/2015 0819   BASOSABS 0 10/26/2015  0819    . CMP Latest Ref Rng 10/26/2015 04/28/2015 01/05/2015  Glucose 70 - 99 mg/dL 113(H) 95 99  BUN 7 - 25 mg/dL 26(H) 30(H) 29(H)  Creatinine 0.70 - 1.18 mg/dL 1.71(H) 1.64(H) 1.92(H)  Sodium 135 - 146 mmol/L 142 141 138  Potassium 3.5 - 5.3 mmol/L 4.8 4.9 4.8  Chloride 98 - 110 mmol/L 103 107 102  CO2 20 - 31 mmol/L _0 Calcium 8.6 - 10.3 mg/dL 9.1 9.1 9.7  Total Protein 6.1 - 8.1 g/dL 6.7 6.5 6.7  Total Bilirubin 0.2 - 1.2 mg/dL 1.1 1.1 1.2  Alkaline Phos 40 - 115 U/L 37(L) 39(L) 36(L)  AST 10 - 35 U/L _1 ALT 9 - 46 U/L _2 RADIOGRAPHIC STUDIES: I have personally reviewed the radiological images as listed and agreed with the findings in  the report.  ABDOMEN ULTRASOUND COMPLETE  COMPARISON:  CT urogram of August 15, 2011 and a right upper quadrant ultrasound of September 23, 2008  FINDINGS: Gallbladder: The gallbladder is surgically absent.  Common bile duct: Diameter: 5 mm where visualized  Liver: Bowel gas obscures the left hepatic lobe. The echotexture of the liver is somewhat heterogeneously increased. There is no discrete mass or ductal dilation.  IVC: Largely obscured by bowel gas.  Pancreas: Obscured by bowel gas.  Spleen: Normal in size and echotexture  Right Kidney: Length: 11.3 cm. The renal cortical echotexture is increased. There are multiple cysts in the right kidney with the largest measuring 5.6 x 4.3 x 4.2 cm. No hydronephrosis is observed.  Left Kidney: Length: 21 cm. The renal cortical echotexture is increased. There are multiple cysts with the largest measuring 9.1 x 6.5 x 9.2 cm.  Abdominal aorta: No aneurysm visualized. Limited visualization due to bowel gas.  Other findings: None.  IMPRESSION: 1. Limited visualization of the liver. The left lobe is not well visualized. The hepatic echotexture is heterogeneously increased without evidence of a discrete mass. The gallbladder is surgically absent. 2. Large  kidneys with increased parenchymal echotexture and multiple large cysts. These findings have been demonstrated in the past. 3. Limited visualization of the CBD, IVC, abdominal aorta, and pancreas due to bowel gas and the patient's body habitus.   Electronically Signed   By: David  Martinique M.D.   On: 11/23/2015 08:48   ASSESSMENT & PLAN:    75 yo caucasian male with multiple medical co-morbidities as noted above with   1) Chronic mild Isolated thrombocytopenia ( plt 90-110k).  Patient thrombocytopenia hasnt changed significantly since 2014. 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 Medication effect could be an additional element (propafenone, ppi ) Cannot r/o an immune component. No evidence of pseudothrombocytopenia on PBS. HIV and Hep C neg B12 wnl PLAN -patient has mild chronic thrombocytopenia. Platelets currently 100k and do not increase the risk of bleeding over baseline. -recommended patient work on life style modifications with his PCP to work on control of DM2 with weight loss and exercise. -minimize ETOH intake. -no indication for PLT transfusion or additional workup for the patient;s thrombocytopenia at this time. -no indication for treatment of the patient's thrombocytopenia at this time. -PCP could consider liver spleen scan if needed to confirm presence of hypersplenism. -avoid NSAIDS and other medications that could worsen thrombocytopenia. -ok to continue therapeutic anticoagulation at these platelet level. Would need to be careful if platelets drop to <=50k. -will need to be mindful of the Eliquis dose in the setting of CKD and adjust dose based on renal function.  2) . Patient Active Problem List   Diagnosis Date Noted  . Thrombocytopenia ([place]) 11/16/2015  . Anticoagulation adequate 10/23/2015  . Trigeminy  05/14/2014  . Hyperlipidemia LDL goal <70 04/07/2014  . Essential hypertension 04/07/2014  . Type 2 diabetes mellitus ([place]) 12/02/2013  . Personal history of colonic polyps 11/08/2013  . 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/08/2013  . Atrial flutter ([place]) 07/29/2013  . PAF (paroxysmal atrial fibrillation) ([place]) 04/09/2013  . Edema 11/05/2012  . Pulmonary HTN ([place])   . Renal insufficiency 06/01/2011  . Obesity 11/10/2009  . Sleep apnea 10/22/2007  . Normal coronary arteries 04/06/2007  . VENTRICULAR TACHYCARDIA 03/27/2007  . HYPERLIPIDEMIA 12/07/2006  . THROMBOCYTOPENIA 12/07/2006  . HYPERTENSION 12/07/2006  .  GERD 12/07/2006  . TRANSAMINASES, SERUM, ELEVATED 12/07/2006  . HYPERGLYCEMIA 12/07/2006  -continue f/u with PCP for other medical issues.  RTC with Dr Irene Limbo in 6 months with rpt labs ot monitor PLT counts.  All of the patients questions were answered with apparent satisfaction. The patient knows to call the clinic with any problems, questions or concerns.  I spent 45 minutes counseling the patient face to face. The total time spent in the appointment was 60 minutes and more than 50% was on counseling and direct patient cares.    Sullivan Lone MD [place] AAHIVMS [facility] Hematology/Oncology Physician [facility]  (Office):       [phone] (Work cell):  [phone] (Fax):           [phone]  11/16/2015 11:42 AM

## 2015-11-17 LAB — HEPATITIS C ANTIBODY (REFLEX): HCV Ab: <0.1 s/co ratio (ref 0.0–0.9)

## 2015-11-17 LAB — HIV ANTIBODY (ROUTINE TESTING W REFLEX): HIV Screen 4th Generation wRfx: NONREACTIVE

## 2015-11-18 LAB — VITAMIN B12: Vitamin B12: 583 pg/mL (ref 211–946)

## 2015-11-23 ENCOUNTER — Ambulatory Visit (HOSPITAL_COMMUNITY)
Admission: RE | Admit: 2015-11-23 | Discharge: 2015-11-23 | Disposition: A | Discharge status: Home / Self Care | Payer: Medicare Other | Source: Ambulatory Visit | Attending: Hematology | Admitting: Hematology

## 2015-11-23 DIAGNOSIS — Z9049 Acquired absence of other specified parts of digestive tract: Secondary | ICD-10-CM | POA: Insufficient documentation

## 2015-11-23 DIAGNOSIS — K7581 Nonalcoholic steatohepatitis (NASH): Secondary | ICD-10-CM | POA: Insufficient documentation

## 2015-11-23 DIAGNOSIS — R140 Abdominal distension (gaseous): Secondary | ICD-10-CM | POA: Insufficient documentation

## 2015-11-23 DIAGNOSIS — D696 Thrombocytopenia, unspecified: Secondary | ICD-10-CM | POA: Insufficient documentation

## 2015-11-23 DIAGNOSIS — N281 Cyst of kidney, acquired: Secondary | ICD-10-CM | POA: Diagnosis not present

## 2015-11-29 ENCOUNTER — Other Ambulatory Visit: Payer: Self-pay | Admitting: Cardiovascular Disease

## 2015-11-30 NOTE — Telephone Encounter (Signed)
Rx request sent to pharmacy.  

## 2015-12-22 ENCOUNTER — Other Ambulatory Visit: Payer: Self-pay | Admitting: Cardiovascular Disease

## 2016-01-25 ENCOUNTER — Other Ambulatory Visit: Payer: Self-pay | Admitting: Cardiovascular Disease

## 2016-01-26 NOTE — Telephone Encounter (Signed)
Rx(s) sent to pharmacy electronically.  

## 2016-01-27 ENCOUNTER — Other Ambulatory Visit: Payer: Self-pay | Admitting: Cardiovascular Disease

## 2016-03-01 ENCOUNTER — Ambulatory Visit: Payer: Medicare Other | Admitting: Cardiovascular Disease

## 2016-03-01 ENCOUNTER — Encounter: Payer: Self-pay | Admitting: Cardiovascular Disease

## 2016-03-01 ENCOUNTER — Ambulatory Visit (INDEPENDENT_AMBULATORY_CARE_PROVIDER_SITE_OTHER): Payer: Medicare Other | Admitting: Cardiovascular Disease

## 2016-03-01 VITALS — BP 140/82 | HR 63 | Ht 71.0 in | Wt 235.6 lb

## 2016-03-01 DIAGNOSIS — I4892 Unspecified atrial flutter: Secondary | ICD-10-CM

## 2016-03-01 DIAGNOSIS — I2510 Atherosclerotic heart disease of native coronary artery without angina pectoris: Secondary | ICD-10-CM

## 2016-03-01 DIAGNOSIS — G4733 Obstructive sleep apnea (adult) (pediatric): Secondary | ICD-10-CM | POA: Diagnosis not present

## 2016-03-01 DIAGNOSIS — D696 Thrombocytopenia, unspecified: Secondary | ICD-10-CM

## 2016-03-01 NOTE — Progress Notes (Signed)
Patient ID: Martin Cordova, male   DOB: 1940-10-10, 75 y.o.   MRN: [id number]      HPI: Martin Cordova is a 75 y.o. male is who presents to the office today for a 4 month follow-up cardiology evaluation.  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 In the past, he developed an obstructive uropathy attributed to kidney stones; creatinine had risen up to 3 and ultimately improved and stabilized at approximately 1.7. Martin Cordova uses his BiPAP daily and does note good sleep.  In the past he had issues with mild peripheral edema, which ultimately improved.  He had been previously taken off his lisinopril when his creatinine had risen in the setting of his obstructive uropathy.  In 2015 he developed recurrent episodes of palpitations and was found to have recurrent paroxysmal atrial fibrillation/flutter.  His medications were adjusted and  he was started on antiarrhythmic therapy with Rythmol to take in addition to his increasing doses of carvedilol.  He was started on Eliquis for anticoagulation.  He underwent successful cardioversion on 08/05/2013.  Since that time, he is unaware of any recurrent atrial fibrillation.  He has noticed more energy. He had been maintained on 50 mg twice a day of carvedilol in addition to Rythmol 225 mg every 8 hours, but due to slow pulse rates, these doses have ultimately been reduced.  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 He has been on CPAP therapy initially for approximately 8 years and has been on BiPAP auto SV for over 4 years. He admits to using his BiPAP with 100% compliance.  He obtained a new BiPAP machine in June.  He feels that his machine is significantly  improved from his prior one. When I last saw him I reviewed his  download of his new BiPAP auto SV machine indicates that 90% of the time is EPAP pressure was 17 and 90% of the time his pressure support was 5.8, giving an IPAP of 21.8 cm.  He is using it 100% of the time and is averaging 5 hours and 49 minutes on his most recent download from 12/07/2013 through 01/05/2014.  His average AHI was 7.5.  His device setting maximum BiPAP pressure is 25 cm in maximum EPAP pressure 21 cm.  His average central event index is 1.3, and average obstructive apneic index 0.2, with an average copy index of 6.0.  Average breath.  Rate is 18 breaths per minute with a minute ventilation of 12.1.  Average total body may 670 mL.  When I saw him on 10/21/15 he was unaware of any arrhythmia.  He was on carvedilol 12.5 mg twice a day, Rythmol 225 mg twice a day, hydralazine 25 mg twice a day and eliquis  5 mg twice a day.  He also has been taking omeprazole for GERD and atorvastatin 10 mg for hyperlipidemia. He was found to have recurrent atrial flutter with variable block.  At that time, I recommended that he increase his Rythmol and take 225 mg every 8 hours and further increased his carvedilol to 18.75 mg twice a day.  Laboratory had revealed a magnesium of 2.0.  He has stage III chronic kidney disease and  his BUN was 26, creatinine 1.71 with a GFR estimate of 39.  Potassium was 4.8.  TSH was normal at 2.4.  Insomnia.  One week later in follow-up.  He had reverted back to sinus rhythm..  Over the past 4 months, he believes his rhythm has been stable.  He is unaware of any palpitations.  He denies chest tightness.  He has been on increased carvedilol regimen at 18.75 mg twice a day and he continues to take prep often known to her 25 mg every 8 hours.  He also is on hydralazine 25 mg twice a day, eloquence 5 mg twice a day for anticoagulation and denies any bleeding.  He is tolerating atorvastatin 10 mg for hyperlipidemia.  He takes  omega-3 fatty acid.  He also has a prescription for Viagra which he takes one half of 100 mg pill as needed.   Past Medical History  Diagnosis Date  . GERD 12/07/2006  . HYPERGLYCEMIA 12/07/2006  . HYPERLIPIDEMIA 12/07/2006    mixed wth an atherogenic dyslipidemic pattern  . HYPERTENSION 12/07/2006  . OBESITY 11/10/2009  . THROMBOCYTOPENIA 12/07/2006  . TRANSAMINASES, SERUM, ELEVATED 12/07/2006  . SLEEP APNEA 10/22/2007    uses bipap  . 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 Claiborne Billings  . Diabetes mellitus without complication   . Renal insufficiency     Cr to 3 on ACE  . Nephrolithiasis   . Normal coronary arteries 04/06/2007    by cath EF 55%., last ECHO 10/13/10 EF>55% mild MR, last nuc 06/16/10 EF 57% low risk scan  . Pulmonary HTN     RV syst.  40-43mHg- moderate by echo  . Palpitations     tachy  . PAF (paroxysmal atrial fibrillation) 04/09/2013    Past Surgical History  Procedure Laterality Date  . Cholecystectomy    . Rotator cuff repair    . Cystoscopy/retrograde/ureteroscopy  08/19/2011    Procedure: CYSTOSCOPY/RETROGRADE/URETEROSCOPY;  Surgeon: MHanley Ben MD;  [facility]  Service: Urology;  Laterality: Right;  JJ STENT PLACEMENT   . Cardiac catheterization  04/06/2007    normal cors    Allergies  Allergen Reactions  . Lisinopril     Renal insufficiency    Current Outpatient Prescriptions:  .  atorvastatin (LIPITOR) 10 MG tablet, TAKE 1 TABLET BY MOUTH AT BEDTIME, Disp: 30 tablet, Rfl: 6 .  carvedilol (COREG) 12.5 MG tablet, Take 1.5 tablets (18.75 mg total) by mouth 2 (two) times daily with a meal., Disp: 90 tablet, Rfl: 6 .  ELIQUIS 5 MG TABS tablet, TAKE 1 TABLET BY MOUTH TWICE A DAY, Disp: 60 tablet, Rfl: 5 .  hydrALAZINE (APRESOLINE) 25 MG tablet, TAKE 1 TABLET (25 MG TOTAL) BY MOUTH 2 (TWO) TIMES DAILY., Disp: 60 tablet, Rfl: 4 .  NON FORMULARY, BIPAP therapy, Disp: , Rfl:  .  omega-3 acid ethyl esters (LOVAZA) 1 g capsule,  TAKE 2 CAPSULES BY MOUTH EVERY DAY, Disp: 60 capsule, Rfl: 10 .  omeprazole (PRILOSEC) 20 MG capsule, TAKE 1 CAPSULE (20 MG TOTAL) BY MOUTH DAILY., Disp: 30 capsule, Rfl: 11 .  potassium citrate (UROCIT-K) 10 MEQ (1080 MG) SR tablet, Take 10 mEq by mouth 3 (three) times daily with meals. , Disp: , Rfl:  .  propafenone (RYTHMOL) 225 MG tablet, Take 1 tablet (225 mg total) by mouth every 8 (eight) hours., Disp: 90 tablet, Rfl: 6 .  sildenafil (VIAGRA) 100 MG tablet, Take 0.5-1 tablets (50-100 mg total) by mouth daily as  needed for erectile dysfunction., Disp: 5 tablet, Rfl: 11  Socially he is married has 4 children and 2 grandchildren. He is a distant relative to the "Paullin brothers." He does try to walk and exercise. There is no tobacco use. He does drink occasional alcohol.   ROS General: Negative; No fevers, chills, or night sweats;  HEENT: Negative; No changes in vision or hearing, sinus congestion, difficulty swallowing Pulmonary: Negative; No cough, wheezing, shortness of breath, hemoptysis Cardiovascular: Negative; No chest pain, presyncope, syncope, palpatations No recent peripheral edema GI: Negative; No nausea, vomiting, diarrhea, or abdominal pain GU: Renal function has stabilized; No dysuria, hematuria, or difficulty voiding; some difficulty with erectile function Musculoskeletal: Negative; no myalgias, joint pain, or weakness Hematologic/Oncology: Negative; no easy bruising, bleeding Endocrine: Negative; no heat/cold intolerance; no diabetes Neuro: Negative; no changes in balance, headaches Skin: Negative; No rashes or skin lesions Psychiatric: Negative; No behavioral problems, depression Sleep: He is using his BiPAP therapy with 100% compliance.  No snoring, daytime sleepiness, hypersomnolence, bruxism, restless legs, hypnogognic hallucinations, no cataplexy Other comprehensive 14 point system review is negative.  PE BP 140/82   Pulse 63   Ht 5' 11" (1.803 m)   Wt 235 lb  9.6 oz (106.9 kg)   BMI 32.86 kg/m   Repeat blood pressure by me was 138/82  Wt Readings from Last 3 Encounters:  03/01/16 235 lb 9.6 oz (106.9 kg)  11/16/15 226 lb 14.4 oz (102.9 kg)  10/28/15 226 lb 6.4 oz (102.7 kg)   General: Alert, oriented, no distress.  HEENT: Normocephalic, atraumatic. Pupils round and reactive; sclera anicteric; no xanthelasmas. Extraocular muscles are full. There is no lid lag. Nose without nasal septal hypertrophy Mouth/Parynx benign; Mallinpatti scale 3  Neck: Thick, no JVD, no carotid bruits; normal carotid upstroke Lungs: clear to ausculatation and percussion; no wheezing or rales Chest wall: No tenderness to palpation Heart: PMI not displaced.  Rhythm today is regular., s1 s2 normal 1/6 sem; no ectopy heard on auscultation Abdomen: Moderate central adiposity ;soft, nontender; no hepatosplenomehaly, BS+; abdominal aorta nontender and not dilated by palpation. Back: No CVA tenderness Pulses 2+ Extremities: Resolution of prior bilateral ankle swelling, Homan's sign negative  Neurologic: grossly nonfocal; cranial nerves intact. Psychological: Normal affect and mood  ECG (independently read by me): Normal sinus rhythm at 63 bpm.  First-degree AV block with a PR interval of 208 ms.  Right bundle branch block with repolarization changes.  QTc interval 466 ms.  10/28/15 ECG (independently read by me): Sinus bradycardia at 54 bpm.  First degree block with PR interval 222 ms.  Right bundle-branch block.  05/17/2017ECG (independently read by me): Atrial flutter with variable block.  Right bundle-branch block with repolarization changes.  October 2016 ECG (independently read by me): sinus rhythm with first-degree AV block with a PR interval at 216 ms.  Ventricular rate 86 bpm with occasional PVCs.    May 2016ECG (independently read by me): Normal sinus rhythm at 60 bpm.  Right bundle-branch block with repolarization changes.  November 2015 ECG (independently  read by me): Normal sinus rhythm at 61.  Nonspecific T abnormality.  QTc interval 394 ms.  11/18/2013 ECG (independently read by me): Sinus bradycardia 58 beats per minute.  PR interval 198 ms; QTc interval 371 ms.  Nonspecific ST changes.  07/29/2013 ECG  (independently read by me): Atrial flutter with 2:1 block with a ventricular rate of 87 beats per minute. Atrial rate is approximately 360 ms. Right bundle branch block with repolarization  changes  07/08/2013 ECG (independently read by me): Probable A. fib flutter now with right bundle branch block and repolarization changes.  Prior ECG of 06/04/2013: EKG  suggests probable atrial flutter with a ventricular rate of 105 beats per minute. There also are frequent PVCs. QTc interval is 436 ms. They're nonspecific T changes.  LABS: BMP Latest Ref Rng & Units 11/16/2015 10/26/2015 04/28/2015  Glucose 70 - 140 mg/dl 85 113(H) 95  BUN 7.0 - 26.0 mg/dL 22.6 26(H) 30(H)  Creatinine 0.7 - 1.3 mg/dL 1.7(H) 1.71(H) 1.64(H)  Sodium 136 - 145 mEq/L 143 142 141  Potassium 3.5 - 5.1 mEq/L 4.8 4.8 4.9  Chloride 98 - 110 mmol/L - 103 107  CO2 22 - 29 mEq/L 30(H) 25 25  Calcium 8.4 - 10.4 mg/dL 9.5 9.1 9.1   Hepatic Function Latest Ref Rng & Units 11/16/2015 10/26/2015 04/28/2015  Total Protein 6.4 - 8.3 g/dL 7.0 6.7 6.5  Albumin 3.5 - 5.0 g/dL 3.8 4.3 4.2  AST 5 - 34 U/L _0 ALT 0 - 55 U/L _1 Alk Phosphatase 40 - 150 U/L 38(L) 37(L) 39(L)  Total Bilirubin 0.20 - 1.20 mg/dL 0.88 1.1 1.1  Bilirubin, Direct 0.0 - 0.3 mg/dL - - -   CBC Latest Ref Rng & Units 11/16/2015 10/26/2015 04/28/2015  WBC 4.0 - 10.3 10e3/uL 4.8 5.2 4.6  Hemoglobin 13.0 - 17.1 g/dL 14.0 14.0 14.1  Hematocrit 38.4 - 49.9 % 41.2 43.4 42.5  Platelets 140 - 400 10e3/uL 100(L) 95(L) 106(L)   Lab Results  Component Value Date   TSH 2.40 10/26/2015   Lipid Panel     Component Value Date/Time   CHOL 93 (L) 10/26/2015 0819   TRIG 130 10/26/2015 0819   HDL 32 (L)  10/26/2015 0819   CHOLHDL 2.9 10/26/2015 0819   VLDL 26 10/26/2015 0819   LDLCALC 35 10/26/2015 0819   LDLDIRECT 65.0 04/12/2013 0825     RADIOLOGY: No results found.    ASSESSMENT AND PLAN: Mr. Cassar is a 75 year old Caucasian male who has documented normal coronary arteries by heart catheterization in 2008. He has a history of hypertension, mixed hyperlipidemia, and has paroxysmal atrial fibrillation/flutter.  He has lost a significant amount of weight over the years and his weight has been fairly stable since his last visit.  With his significant weight loss.  He had developed slow pulses in the past leading to reduction of his medical regimen. Since his recurrent atrial flutter in early May, he has been maintaining sinus rhythm on his increased regimen of carvedilol in addition to his Rythmol 225 mg every 8 hours.  He has chronic right bundle branch block, which is stable.  His blood pressure today is stable and he continues to tolerate hydralazine.  He continues to be on eliquis 5 mg twice a day and denies bleeding.   He has persistently had thrombocytopenia and has never been evaluated by hematology.  Upon further questioning,  his mother had ITP.   His platelets count 2 prior to his last evaluation was 95,000 and with his need for chronic anticoagulation, especially since he has developed recurrent arrhythmia  I have recommended a referral for hematology evaluation. This was done on 11/16/2015 and he was seen by Dr. Irene Limbo.  I have reviewed her note but unfortunately she never put her assessment in her note which still remains open.  He denies any bleeding.  I reviewed.  Subsequent blood work from June 2017, and his  platelet count was 100,000.  I also reviewed his abdominal ultrasound in which the left hepatic lobe is not well well visualized due to bowel gas.  There were no discrete masses or ductal dilation.  He had previously noted multiple large kidney cysts.  Presently, he is without  chest pain.  He is without palpitations.  As long as he remains stable, he will continue his current medical regimen and I will see him in 6 months for reevaluation.  Shelva Majestic, MD  03/01/2016  2:22 PM

## 2016-03-01 NOTE — Patient Instructions (Signed)
Your physician wants you to follow-up in: 6 months or sooner if needed. You will receive a reminder letter in the mail two months in advance. If you don't receive a letter, please call our office to schedule the follow-up appointment.   If you need a refill on your cardiac medications before your next appointment, please call your pharmacy. 

## 2016-04-24 ENCOUNTER — Other Ambulatory Visit: Payer: Self-pay | Admitting: Cardiovascular Disease

## 2016-04-26 ENCOUNTER — Other Ambulatory Visit: Payer: Self-pay | Admitting: Cardiovascular Disease

## 2016-05-16 ENCOUNTER — Other Ambulatory Visit: Payer: Self-pay | Admitting: *Deleted

## 2016-05-16 DIAGNOSIS — D696 Thrombocytopenia, unspecified: Secondary | ICD-10-CM

## 2016-05-17 ENCOUNTER — Encounter: Payer: Self-pay | Admitting: Hematology

## 2016-05-17 ENCOUNTER — Ambulatory Visit (HOSPITAL_BASED_OUTPATIENT_CLINIC_OR_DEPARTMENT_OTHER): Payer: Medicare Other | Admitting: Hematology

## 2016-05-17 ENCOUNTER — Other Ambulatory Visit (HOSPITAL_BASED_OUTPATIENT_CLINIC_OR_DEPARTMENT_OTHER): Payer: Medicare Other

## 2016-05-17 VITALS — BP 135/80 | HR 86 | Temp 97.8°F | Resp 18 | Wt 238.8 lb

## 2016-05-17 DIAGNOSIS — I4891 Unspecified atrial fibrillation: Secondary | ICD-10-CM | POA: Diagnosis not present

## 2016-05-17 DIAGNOSIS — K7581 Nonalcoholic steatohepatitis (NASH): Secondary | ICD-10-CM

## 2016-05-17 DIAGNOSIS — D696 Thrombocytopenia, unspecified: Secondary | ICD-10-CM | POA: Diagnosis not present

## 2016-05-17 DIAGNOSIS — E119 Type 2 diabetes mellitus without complications: Secondary | ICD-10-CM | POA: Diagnosis not present

## 2016-05-17 DIAGNOSIS — I10 Essential (primary) hypertension: Secondary | ICD-10-CM

## 2016-05-17 LAB — COMPREHENSIVE METABOLIC PANEL
ALT: 26 U/L (ref 0–55)
AST: 22 U/L (ref 5–34)
Albumin: 3.4 g/dL — ABNORMAL LOW (ref 3.5–5.0)
Alkaline Phosphatase: 51 U/L (ref 40–150)
Anion Gap: 8 mEq/L (ref 3–11)
BUN: 25.8 mg/dL (ref 7.0–26.0)
CO2: 27 mEq/L (ref 22–29)
Calcium: 9.6 mg/dL (ref 8.4–10.4)
Chloride: 108 mEq/L (ref 98–109)
Creatinine: 2 mg/dL — ABNORMAL HIGH (ref 0.7–1.3)
EGFR: 32 mL/min/{1.73_m2} — ABNORMAL LOW (ref >90)
Glucose: 100 mg/dl (ref 70–140)
Potassium: 5.2 mEq/L — ABNORMAL HIGH (ref 3.5–5.1)
Sodium: 143 mEq/L (ref 136–145)
Total Bilirubin: 0.56 mg/dL (ref 0.20–1.20)
Total Protein: 7.3 g/dL (ref 6.4–8.3)

## 2016-05-17 LAB — CBC WITH DIFFERENTIAL/PLATELET
BASO%: 0.3 % (ref 0.0–2.0)
Basophils Absolute: 0 10*3/uL (ref 0.0–0.1)
EOS%: 2.3 % (ref 0.0–7.0)
Eosinophils Absolute: 0.2 10*3/uL (ref 0.0–0.5)
HCT: 42.5 % (ref 38.4–49.9)
HGB: 14.2 g/dL (ref 13.0–17.1)
LYMPH%: 17.4 % (ref 14.0–49.0)
MCH: 30.9 pg (ref 27.2–33.4)
MCHC: 33.4 g/dL (ref 32.0–36.0)
MCV: 92.4 fL (ref 79.3–98.0)
MONO#: 0.7 10*3/uL (ref 0.1–0.9)
MONO%: 10.7 % (ref 0.0–14.0)
NEUT#: 4.5 10*3/uL (ref 1.5–6.5)
NEUT%: 69.3 % (ref 39.0–75.0)
Platelets: 123 10*3/uL — ABNORMAL LOW (ref 140–400)
RBC: 4.6 10*6/uL (ref 4.20–5.82)
RDW: 12.8 % (ref 11.0–14.6)
WBC: 6.5 10*3/uL (ref 4.0–10.3)
lymph#: 1.1 10*3/uL (ref 0.9–3.3)

## 2016-05-17 NOTE — Progress Notes (Signed)
Marland Kitchen    HEMATOLOGY/ONCOLOGY CONSULTATION NOTE  Date of Service: .05/17/2016  Patient Care Team: Susy Frizzle, MD as PCP - General (Family Medicine)  CHIEF COMPLAINTS/PURPOSE OF CONSULTATION:  Thrombocytopenia  HISTORY OF PRESENTING ILLNESS:   Martin Cordova is a wonderful 75 y.o. male who has been referred to [place] by Dr Claiborne Billings for evaluation and management of thrombocytopenia.  Patient has a h/o HTN, HLD, DM2, sleep apnea, GERD, CAD, obesity and NASH who was referred for evaluation of thrombocytopenia.  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  Hgb and WBC count have been WNL. No issues with bleeding or bruising, No recent new medications. He is on chronic PPI therapy. He is chronic anticoagulation for his atrial fibrillation.  No other acute new concerns.  INTERVAL HISTORY   Martin Cordova is here for his 6 month f/u for thrombocytopenia. He notes no acute new concerns. No issues with bleeding or excessive bleeding. No new medications. CBC today shows improvement in his PLT counts to 123k.    MEDICAL HISTORY:  Past Medical History:  Diagnosis Date  . Colon polyps   . Diabetes mellitus without complication ([place])   . Diverticulosis   . GERD 12/07/2006  . HYPERGLYCEMIA 12/07/2006  . HYPERLIPIDEMIA 12/07/2006   mixed wth an atherogenic dyslipidemic pattern  . HYPERTENSION 12/07/2006  . Nephrolithiasis   . Nonalcoholic fatty liver disease   . Normal coronary arteries 04/06/2007   by cath EF 55%., last ECHO 10/13/10 EF>55% mild Martin, last nuc 06/16/10 EF 57% low risk scan  . OBESITY 11/10/2009  . PAF (paroxysmal atrial fibrillation) ([place]) 04/09/2013  . Palpitations    tachy  . Pulmonary HTN ([facility])    RV syst.  40-24mHg- moderate by echo  . Renal insufficiency    Cr to 3 on ACE  . SLEEP APNEA 10/22/2007   uses bipap  . THROMBOCYTOPENIA 12/07/2006  . TRANSAMINASES, SERUM, ELEVATED 12/07/2006  . VENTRICULAR TACHYCARDIA  03/27/2007   monitored by dr. kClaiborne Billings   SURGICAL HISTORY: Past Surgical History:  Procedure Laterality Date  . CARDIAC CATHETERIZATION  04/06/2007   normal cors  . CARDIOVERSION N/A 08/05/2013   Procedure: CARDIOVERSION;  Surgeon: TTroy Sine MD;  Location: [facility]  Service: Cardiovascular;  Laterality: N/A;  . CHOLECYSTECTOMY    . CYSTOSCOPY/RETROGRADE/URETEROSCOPY  08/19/2011   Procedure: CYSTOSCOPY/RETROGRADE/URETEROSCOPY;  Surgeon: MHanley Ben MD;  [facility]  Service: Urology;  Laterality: Right;  JJ STENT PLACEMENT   . ROTATOR CUFF REPAIR Right     SOCIAL HISTORY: Social History   Social History  . Marital status: Married    Spouse name: N/A  . Number of children: 4  . Years of education: N/A   Occupational History  . retired tPharmacist, hospital   Social History Main Topics  . Smoking status: Former Smoker    Years: 10.00    Types: Cigarettes    Quit date: 08/04/1980  . Smokeless tobacco: Former [employer] developer   Types: Chew    Quit date: 08/08/2004  . Alcohol use 0.0 oz/week     Comment: 1 beer 2-3 times per month  . Drug use: No  . Sexual activity: Yes   Other Topics Concern  . Not on file   Social History Narrative  . No narrative on file    FAMILY HISTORY: Family History  Problem Relation Age of Onset  . Diabetes Mother   . Heart failure  Mother   . Stroke Mother   . Kidney failure Father   . Diabetes Father   . Cancer Paternal Uncle     ALLERGIES:  is allergic to lisinopril.  MEDICATIONS:  Current Outpatient Prescriptions  Medication Sig Dispense Refill  . Multiple Vitamins-Minerals (CENTRUM SILVER 50+MEN) TABS Take by mouth.    . NON FORMULARY BIPAP therapy    . potassium citrate (UROCIT-K) 10 MEQ (1080 MG) SR tablet Take 10 mEq by mouth 3 (three) times daily with meals.     . sildenafil (VIAGRA) 100 MG tablet Take 0.5-1 tablets (50-100 mg total) by mouth daily as needed for erectile dysfunction. 5 tablet 11  . atorvastatin  (LIPITOR) 10 MG tablet TAKE 1 TABLET BY MOUTH AT BEDTIME 30 tablet 6  . carvedilol (COREG) 25 MG tablet Take 1.5 tablets (37.5 mg total) by mouth 2 (two) times daily with a meal. 90 tablet 6  . ELIQUIS 5 MG TABS tablet TAKE 1 TABLET BY MOUTH TWICE A DAY 60 tablet 5  . hydrALAZINE (APRESOLINE) 25 MG tablet TAKE 1 TABLET BY MOUTH TWICE A DAY 60 tablet 4  . omega-3 acid ethyl esters (LOVAZA) 1 g capsule TAKE 2 CAPSULES BY MOUTH EVERY DAY 60 capsule 4  . omeprazole (PRILOSEC) 20 MG capsule TAKE 1 CAPSULE (20 MG TOTAL) BY MOUTH DAILY. 30 capsule 11  . propafenone (RYTHMOL) 225 MG tablet Take 225 mg by mouth every 8 (eight) hours.     No current facility-administered medications for this visit.     REVIEW OF SYSTEMS:    10 Point review of Systems was done is negative except as noted above.  PHYSICAL EXAMINATION: ECOG PERFORMANCE STATUS: 1 - Symptomatic but completely ambulatory  . Vitals:   05/17/16 0826  BP: 135/80  Pulse: 86  Resp: 18  Temp: 97.8 F (36.6 C)   Filed Weights   05/17/16 0826  Weight: 238 lb 12.8 oz (108.3 kg)   .Body mass index is 33.31 kg/m.  GENERAL:alert, in no acute distress and comfortable SKIN: skin color, texture, turgor are normal, no rashes or significant lesions EYES: normal, conjunctiva are pink and non-injected, sclera clear OROPHARYNX:no exudate, no erythema and lips, buccal mucosa, and tongue normal  NECK: supple, no JVD, thyroid normal size, non-tender, without nodularity LYMPH:  no palpable lymphadenopathy in the cervical, axillary or inguinal LUNGS: clear to auscultation with normal respiratory effort HEART: regular rate & rhythm,  no murmurs and no lower extremity edema ABDOMEN: abdomen soft, non-tender, normoactive bowel sounds  Musculoskeletal: no cyanosis of digits and no clubbing  PSYCH: alert & oriented x 3 with fluent speech NEURO: no focal motor/sensory deficits  LABORATORY DATA:  I have reviewed the data as listed  Component      Latest Ref Rng & Units 11/16/2015  WBC     4.0 - 10.3 10e3/uL 4.8  NEUT#     1.5 - 6.5 10e3/uL 3.2  Hemoglobin     13.0 - 17.1 g/dL 14.0  HCT     38.4 - 49.9 % 41.2  Platelets     140 - 400 10e3/uL 100 (L)  MCV     79.3 - 98.0 fL 91.6  MCH     27.2 - 33.4 pg 31.1  MCHC     32.0 - 36.0 g/dL 34.0  RBC     4.20 - 5.82 10e6/uL 4.50  RDW     11.0 - 14.6 % 13.2  lymph#     0.9 - 3.3 10e3/uL 1.0  MONO#     0.1 - 0.9 10e3/uL 0.5  Eosinophils Absolute     0.0 - 0.5 10e3/uL 0.1  Basophils Absolute     0.0 - 0.1 10e3/uL 0.0  NEUT%     39.0 - 75.0 % 66.0  LYMPH%     14.0 - 49.0 % 21.3  MONO%     0.0 - 14.0 % 10.0  EOS%     0.0 - 7.0 % 2.3  BASO%     0.0 - 2.0 % 0.4  Retic %     0.80 - 1.80 % 1.40  Retic Ct Abs     34.80 - 93.90 10e3/uL 63.00  Immature Retic Fract     3.00 - 10.60 % 5.20  Sodium     136 - 145 mEq/L 143  Potassium     3.5 - 5.1 mEq/L 4.8  Chloride     98 - 109 mEq/L 109  CO2     22 - 29 mEq/L 30 (H)  Glucose     70 - 140 mg/dl 85  BUN     7.0 - 26.0 mg/dL 22.6  Creatinine     0.7 - 1.3 mg/dL 1.7 (H)  Total Bilirubin     0.20 - 1.20 mg/dL 0.88  Alkaline Phosphatase     40 - 150 U/L 38 (L)  AST     5 - 34 U/L 17  ALT     0 - 55 U/L 24  Total Protein     6.4 - 8.3 g/dL 7.0  Albumin     3.5 - 5.0 g/dL 3.8  Calcium     8.4 - 10.4 mg/dL 9.5  Anion gap     3 - 11 mEq/L 4  EGFR     >90 ml/min/1.73 m2 40 (L)  HCV Ab     0.0 - 0.9 s/co ratio <0.1  Comment      Comment  HIV     Non Reactive Non Reactive  Vitamin B12     211 - 946 pg/mL 583   . CBC Latest Ref Rng & Units 05/17/2016 11/16/2015  WBC 3.8 - 10.8 K/uL 6.5 4.8  Hemoglobin 13.2 - 17.1 g/dL 14.2 14.0  Hematocrit 38.5 - 50.0 % 42.5 41.2  Platelets 140 - 400 K/uL 123(L) 100(L)   . CBC    Component Value Date/Time   WBC 6.4 09/07/2016 1452   RBC 4.85 09/07/2016 1452   HGB 14.9 09/07/2016 1452   HGB 14.2 05/17/2016 0756   HCT 44.6 09/07/2016 1452   HCT 42.5 05/17/2016  0756   PLT 127 (L) 09/07/2016 1452   PLT 123 (L) 05/17/2016 0756   MCV 92.0 09/07/2016 1452   MCV 92.4 05/17/2016 0756   MCH 30.7 09/07/2016 1452   MCHC 33.4 09/07/2016 1452   RDW 14.5 09/07/2016 1452   RDW 12.8 05/17/2016 0756   LYMPHSABS 1.1 05/17/2016 0756   MONOABS 0.7 05/17/2016 0756   EOSABS 0.2 05/17/2016 0756   BASOSABS 0.0 05/17/2016 0756    . CMP Latest Ref Rng & Units 05/17/2016 11/16/2015 10/26/2015  Glucose 70 - 140 mg/dl 100 85 113(H)  BUN 7.0 - 26.0 mg/dL 25.8 22.6 26(H)  Creatinine 0.7 - 1.3 mg/dL 2.0(H) 1.7(H) 1.71(H)  Sodium 136 - 145 mEq/L 143 143 142  Potassium 3.5 - 5.1 mEq/L 5.2(H) 4.8 4.8  Chloride 98 - 110 mmol/L - - 103  CO2 22 - 29 mEq/L 27 30(H) 25  Calcium 8.4 - 10.4 mg/dL 9.6  9.5 9.1  Total Protein 6.4 - 8.3 g/dL 7.3 7.0 6.7  Total Bilirubin 0.20 - 1.20 mg/dL 0.56 0.88 1.1  Alkaline Phos 40 - 150 U/L 51 38(L) 37(L)  AST 5 - 34 U/L '22 17 18  ' ALT 0 - 55 U/L '26 24 25     ' RADIOGRAPHIC STUDIES: I have personally reviewed the radiological images as listed and agreed with the findings in the report.  ABDOMEN ULTRASOUND COMPLETE  COMPARISON:  CT urogram of August 15, 2011 and a right upper quadrant ultrasound of September 23, 2008  FINDINGS: Gallbladder: The gallbladder is surgically absent.  Common bile duct: Diameter: 5 mm where visualized  Liver: Bowel gas obscures the left hepatic lobe. The echotexture of the liver is somewhat heterogeneously increased. There is no discrete mass or ductal dilation.  IVC: Largely obscured by bowel gas.  Pancreas: Obscured by bowel gas.  Spleen: Normal in size and echotexture  Right Kidney: Length: 11.3 cm. The renal cortical echotexture is increased. There are multiple cysts in the right kidney with the largest measuring 5.6 x 4.3 x 4.2 cm. No hydronephrosis is observed.  Left Kidney: Length: 21 cm. The renal cortical echotexture is increased. There are multiple cysts with the largest measuring  9.1 x 6.5 x 9.2 cm.  Abdominal aorta: No aneurysm visualized. Limited visualization due to bowel gas.  Other findings: None.  IMPRESSION: 1. Limited visualization of the liver. The left lobe is not well visualized. The hepatic echotexture is heterogeneously increased without evidence of a discrete mass. The gallbladder is surgically absent. 2. Large kidneys with increased parenchymal echotexture and multiple large cysts. These findings have been demonstrated in the past. 3. Limited visualization of the CBD, IVC, abdominal aorta, and pancreas due to bowel gas and the patient's body habitus.   Electronically Signed   By: David  Martinique M.D.   On: 11/23/2015 08:48   ASSESSMENT & PLAN:    75 yo caucasian male with multiple medical co-morbidities as noted above with   1) Chronic mild Isolated thrombocytopenia ( plt 90-110k).  Patient thrombocytopenia hasnt changed significantly since 2014. 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 Medication effect could be an additional element (propafenone, ppi ) Cannot r/o an immune component. No evidence of pseudothrombocytopenia on PBS. HIV and Hep C neg B12 wnl PLAN -patient has mild chronic thrombocytopenia. Platelets today have improved to 123k. - no additional recommendation at this time. -continue to work on life style modifications with his PCP to work on control of DM2 with weight loss and exercise. -minimize ETOH intake. -PCP could consider liver spleen scan if needed to confirm presence of hypersplenism if platelet counts worsen. -avoid NSAIDS and other medications that could worsen thrombocytopenia. -ok to continue therapeutic anticoagulation at these platelet level. Would need to be careful if platelets drop to <=50k. -will need to be mindful of the Eliquis dose in the setting of  CKD and adjust dose based on renal function.  2) . Patient Active Problem List   Diagnosis Date Noted  . Thrombocytopenia ([place]) 11/16/2015  . Anticoagulation adequate 10/23/2015  . Trigeminy 05/14/2014  . Hyperlipidemia LDL goal <70 04/07/2014  . Essential hypertension 04/07/2014  . Type 2 diabetes mellitus ([place]) 12/02/2013  . Personal history of colonic polyps 11/08/2013  . 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/08/2013  . Atrial flutter ([place]) 07/29/2013  . PAF (paroxysmal atrial fibrillation) ([place])  04/09/2013  . Edema 11/05/2012  . Pulmonary HTN ([place])   . Renal insufficiency 06/01/2011  . Obesity 11/10/2009  . Sleep apnea 10/22/2007  . Normal coronary arteries 04/06/2007  . VENTRICULAR TACHYCARDIA 03/27/2007  . HYPERLIPIDEMIA 12/07/2006  . THROMBOCYTOPENIA 12/07/2006  . HYPERTENSION 12/07/2006  . GERD 12/07/2006  . TRANSAMINASES, SERUM, ELEVATED 12/07/2006  . HYPERGLYCEMIA 12/07/2006  -continue f/u with PCP for other medical issues. -CKD - counseled regarding low potassium diet. F/u with PCP .  All of the patients questions were answered with apparent satisfaction. The patient knows to call the clinic with any problems, questions or concerns.  I spent 15 minutes counseling the patient face to face. The total time spent in the appointment was 20 minutes and more than 50% was on counseling and direct patient cares.    Sullivan Lone MD [place] AAHIVMS [facility] Hematology/Oncology Physician [facility]  (Office):       [phone] (Work cell):  [phone] (Fax):           [phone]

## 2016-05-24 ENCOUNTER — Other Ambulatory Visit: Payer: Self-pay | Admitting: Cardiovascular Disease

## 2016-05-26 ENCOUNTER — Other Ambulatory Visit: Payer: Self-pay | Admitting: Cardiovascular Disease

## 2016-05-28 ENCOUNTER — Other Ambulatory Visit: Payer: Self-pay | Admitting: Cardiovascular Disease

## 2016-06-05 ENCOUNTER — Other Ambulatory Visit: Payer: Self-pay | Admitting: Cardiovascular Disease

## 2016-06-07 NOTE — Telephone Encounter (Signed)
Rx request sent to pharmacy.  

## 2016-06-08 ENCOUNTER — Other Ambulatory Visit: Payer: Self-pay | Admitting: Cardiovascular Disease

## 2016-06-08 NOTE — Telephone Encounter (Signed)
REFILL 

## 2016-07-20 ENCOUNTER — Other Ambulatory Visit: Payer: Self-pay | Admitting: Family Medicine

## 2016-09-06 ENCOUNTER — Other Ambulatory Visit: Payer: Self-pay | Admitting: Cardiovascular Disease

## 2016-09-06 ENCOUNTER — Telehealth: Payer: Self-pay | Admitting: Cardiovascular Disease

## 2016-09-06 NOTE — Telephone Encounter (Signed)
New Message  Pt c/o medication issue:  1. Name of Medication:  carvedilol 2.5 twice daily propafenone 225 mg tablet every 8 hours  2. How are you currently taking this medication (dosage and times per day)? See above  3. Are you having a reaction (difficulty breathing--STAT)? N/A  4. What is your medication issue? Pt voiced thinks these medications is causing his heart to be out of rhythm and sob.  Pt c/o Shortness Of Breath: STAT if SOB developed within the last 24 hours or pt is noticeably SOB on the phone  1. Are you currently SOB (can you hear that pt is SOB on the phone)? No  2. How long have you been experiencing SOB? Once in a blue moon  3. Are you SOB when sitting or when up moving around? Moving around  4. Are you currently experiencing any other symptoms? No  Pt voiced wanting an appt with MD-Kelly but he didn't have anything until May, pt accepted appt with PA tomorrow but also wants to speak with nurse.  Please f/u

## 2016-09-06 NOTE — Telephone Encounter (Signed)
Rx request sent to pharmacy.  

## 2016-09-06 NOTE — Telephone Encounter (Signed)
Spoke with pt states that he is having SOB on exertion only and denies any other sx. He states that he is not taking his BP but his HR seems stable. Pt states that he scheduled appt with [facility] tomorrow @2pm . He states that he will rest until his appt and will take his BP and HR now and before appt he will bring readings with him to appt. Pt states that he is taking his medications  As ordered

## 2016-09-07 ENCOUNTER — Encounter: Payer: Self-pay | Admitting: Physician Assistant

## 2016-09-07 ENCOUNTER — Ambulatory Visit (INDEPENDENT_AMBULATORY_CARE_PROVIDER_SITE_OTHER): Payer: Medicare Other | Admitting: Physician Assistant

## 2016-09-07 VITALS — BP 152/90 | HR 90 | Ht 71.0 in | Wt 251.0 lb

## 2016-09-07 DIAGNOSIS — G4733 Obstructive sleep apnea (adult) (pediatric): Secondary | ICD-10-CM | POA: Diagnosis not present

## 2016-09-07 DIAGNOSIS — Z79899 Other long term (current) drug therapy: Secondary | ICD-10-CM

## 2016-09-07 DIAGNOSIS — I10 Essential (primary) hypertension: Secondary | ICD-10-CM

## 2016-09-07 DIAGNOSIS — I4892 Unspecified atrial flutter: Secondary | ICD-10-CM | POA: Diagnosis not present

## 2016-09-07 DIAGNOSIS — N183 Chronic kidney disease, stage 3 unspecified: Secondary | ICD-10-CM

## 2016-09-07 DIAGNOSIS — E785 Hyperlipidemia, unspecified: Secondary | ICD-10-CM

## 2016-09-07 DIAGNOSIS — Z9989 Dependence on other enabling machines and devices: Secondary | ICD-10-CM

## 2016-09-07 LAB — CBC
HCT: 44.6 % (ref 38.5–50.0)
Hemoglobin: 14.9 g/dL (ref 13.2–17.1)
MCH: 30.7 pg (ref 27.0–33.0)
MCHC: 33.4 g/dL (ref 32.0–36.0)
MCV: 92 fL (ref 80.0–100.0)
MPV: 11.4 fL (ref 7.5–12.5)
Platelets: 127 10*3/uL — ABNORMAL LOW (ref 140–400)
RBC: 4.85 MIL/uL (ref 4.20–5.80)
RDW: 14.5 % (ref 11.0–15.0)
WBC: 6.4 10*3/uL (ref 3.8–10.8)

## 2016-09-07 MED ORDER — CARVEDILOL 25 MG PO TABS: 25.0000 mg | ORAL_TABLET | Freq: Two times a day (BID) | ORAL | 11 refills | Status: DC

## 2016-09-07 MED ORDER — PROPAFENONE HCL 300 MG PO TABS: 300.0000 mg | ORAL_TABLET | Freq: Three times a day (TID) | ORAL | 6 refills | Status: DC

## 2016-09-07 NOTE — Patient Instructions (Signed)
Medication Instructions:  INCREASE COREG 25MG  TWICE DAILY INCREASE PROPAFENONE 300MG  EVERY 8 HOURS  If you need a refill on your cardiac medications before your next appointment, please call your pharmacy.  Labwork: CBC TODAY AT [facility] ON THE 1ST FLOOR  Follow-Up: Your physician wants you to follow-up in: [place].    Thank you for choosing [facility] [place]!!    HAO MENG, PA-C [place], LPN

## 2016-09-07 NOTE — Progress Notes (Signed)
Cardiology Office Note    Date:  09/07/2016   ID:  Martin Cordova, DOB 09/19/1940, MRN [id number]  PCP:  Martin Fraction, MD  Cardiologist:  Dr. Claiborne Cordova  Chief Complaint  Patient presents with  . Follow-up    seen for Dr. Claiborne Cordova, having some shortness of breath, elevated heart rate and fatigue    History of Present Illness:  Martin Cordova is a 76 y.o. male with PMH of HTN, severe OSA on BiPAP, mixed HLD, metabolic syndrome, stage III CKD, h/o tachypalpitation, PAF and pulmonary HTN. He started taking Rythmol in 2015 after developing recurrent palpitation and found to have PAF. He underwent successful cardioversion on 08/05/2013 after placed on eliquis. His Rythmol dose was later increased due to recurrence of palpitation. He had cardiac catheterization on 04/06/2007 by Dr. Olevia Cordova which showed a normal coronary angiography with EF 55%. There was minimal wall motion abnormality in the inferior wall. His last Myoview was in Jan 2012, it was interpreted as low risk, however did mention mild/moderate inferior ischemia. His last echocardiogram was obtained on 10/13/2010 which showed EF >55, mild LVH, mild AI/PI/TR, PA peak pressure 45 mmHg. his last follow-up with Dr. Claiborne Cordova was on 03/01/2016, he was maintaining sinus rhythm at the time. He did lose significant amount of weight over the years. He was referred to Dr. Irene Cordova for evaluation of thrombocytopenia, she was seen by Dr. Irene Cordova with hematology in June and also in December 2017, however I am unable to access the notes.  He presents today for follow-up. He says he has been using his BiPAP without any issue. For the past several weeks, he has been noticing his heart rate getting faster. He also noticed some shortness of breath and fatigue after he climbed the hill. Today's EKG does show what appears to be atrial flutter. He has 1+ pitting edema in bilateral lower extremity, however his lung is clear on physical exam and that he does not appears to be to  volume overloaded. I will increase his Rythmol to 300mg  TID and Coreg to 25 mg BID. Although he used to have a history of bradycardia, I think with his current heart rate he should be able to tolerate carvedilol dose. I asked him to monitor his heart rate and blood pressure twice a day, if he does convert on Rythmol, he will notice his heart rate go down. He is aware that His symptoms he will have if his heart rate becomes too slow. He will let us know if he has any symptom on the current medication.   Past Medical History:  Diagnosis Date  . Colon polyps   . Diabetes mellitus without complication ([place])   . Diverticulosis   . GERD 12/07/2006  . HYPERGLYCEMIA 12/07/2006  . HYPERLIPIDEMIA 12/07/2006   mixed wth an atherogenic dyslipidemic pattern  . HYPERTENSION 12/07/2006  . Nephrolithiasis   . Nonalcoholic fatty liver disease   . Normal coronary arteries 04/06/2007   by cath EF 55%., last ECHO 10/13/10 EF>55% mild MR, last nuc 06/16/10 EF 57% low risk scan  . OBESITY 11/10/2009  . PAF (paroxysmal atrial fibrillation) ([place]) 04/09/2013  . Palpitations    tachy  . Pulmonary HTN    RV syst.  40-46mmHg- moderate by echo  . Renal insufficiency    Cr to 3 on ACE  . SLEEP APNEA 10/22/2007   uses bipap  . THROMBOCYTOPENIA 12/07/2006  . TRANSAMINASES, SERUM, ELEVATED 12/07/2006  . 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 Claiborne Cordova  Past Surgical History:  Procedure Laterality Date  . CARDIAC CATHETERIZATION  04/06/2007   normal cors  . CARDIOVERSION N/A 08/05/2013   Procedure: CARDIOVERSION;  Surgeon: Martin Sine, MD;  Location: [place];  Service: Cardiovascular;  Laterality: N/A;  . CHOLECYSTECTOMY    . CYSTOSCOPY/RETROGRADE/URETEROSCOPY  08/19/2011   Procedure: CYSTOSCOPY/RETROGRADE/URETEROSCOPY;  Surgeon: Martin Ben, MD;  [facility];  Service: Urology;  Laterality: Right;  JJ STENT PLACEMENT   . ROTATOR CUFF REPAIR Right     Current  Medications: Outpatient Medications Prior to Visit  Medication Sig Dispense Refill  . atorvastatin (LIPITOR) 10 MG tablet TAKE 1 TABLET BY MOUTH AT BEDTIME 30 tablet 6  . ELIQUIS 5 MG TABS tablet TAKE 1 TABLET BY MOUTH TWICE A DAY 60 tablet 5  . hydrALAZINE (APRESOLINE) 25 MG tablet TAKE 1 TABLET BY MOUTH TWICE A DAY 60 tablet 4  . Multiple Vitamins-Minerals (CENTRUM SILVER 50+MEN) TABS Take by mouth.    . NON FORMULARY BIPAP therapy    . omega-3 acid ethyl esters (LOVAZA) 1 g capsule TAKE 2 CAPSULES BY MOUTH EVERY DAY 60 capsule 10  . omeprazole (PRILOSEC) 20 MG capsule TAKE 1 CAPSULE (20 MG TOTAL) BY MOUTH DAILY. 30 capsule 11  . potassium citrate (UROCIT-K) 10 MEQ (1080 MG) SR tablet Take 10 mEq by mouth 3 (three) times daily with meals.     . sildenafil (VIAGRA) 100 MG tablet Take 0.5-1 tablets (50-100 mg total) by mouth daily as needed for erectile dysfunction. 5 tablet 11  . carvedilol (COREG) 12.5 MG tablet TAKE 1 AND 1/2 TABLET BY MOUTH 2 TIMES DAILY 90 tablet 3  . propafenone (RYTHMOL) 225 MG tablet TAKE 1 TABLET BY MOUTH EVERY 8 HOURS 90 tablet 6   No facility-administered medications prior to visit.      Allergies:   Lisinopril   Social History   Social History  . Marital status: Married    Spouse name: N/A  . Number of children: 4  . Years of education: N/A   Occupational History  . retired Pharmacist, hospital    Social History Main Topics  . Smoking status: Former Smoker    Years: 10.00    Types: Cigarettes    Quit date: 08/04/1980  . Smokeless tobacco: Former Systems developer    Types: Chew    Quit date: 08/08/2004  . Alcohol use 0.0 oz/week     Comment: 1 beer 2-3 times per month  . Drug use: No  . Sexual activity: Yes   Other Topics Concern  . None   Social History Narrative  . None     Family History:  The patient's family history includes Cancer in his paternal uncle; Diabetes in his father and mother; Heart failure in his mother; Kidney failure in his father; Stroke in his  mother.   ROS:   Please see the history of present illness.    ROS All other systems reviewed and are negative.   PHYSICAL EXAM:   VS:  BP (!) 152/90   Pulse 90   Ht 5\' 11"  (1.803 m)   Wt 251 lb (113.9 kg)   BMI 35.01 kg/m    GEN: Well nourished, well developed, in no acute distress  HEENT: normal  Neck: no JVD, carotid bruits, or masses Cardiac: Regularly irregular; no murmurs, rubs, or gallops. 1+ pitting edema  Respiratory:  clear to auscultation bilaterally, normal work of breathing GI: soft, nontender, nondistended, + BS MS: no deformity or atrophy  Skin: warm  and dry, no rash Neuro:  Alert and Oriented x 3, Strength and sensation are intact Psych: euthymic mood, full affect  Wt Readings from Last 3 Encounters:  09/07/16 251 lb (113.9 kg)  05/17/16 238 lb 12.8 oz (108.3 kg)  03/01/16 235 lb 9.6 oz (106.9 kg)      Studies/Labs Reviewed:   EKG:  EKG is ordered today.  The ekg ordered today demonstrates New atrial flutter with heart rate 90 bpm  Recent Labs: 10/26/2015: Magnesium 2.0; TSH 2.40 05/17/2016: ALT 26; BUN 25.8; Creatinine 2.0; HGB 14.2; Platelets 123; Potassium 5.2; Sodium 143   Lipid Panel    Component Value Date/Time   CHOL 93 (L) 10/26/2015 0819   TRIG 130 10/26/2015 0819   HDL 32 (L) 10/26/2015 0819   CHOLHDL 2.9 10/26/2015 0819   VLDL 26 10/26/2015 0819   LDLCALC 35 10/26/2015 0819   LDLDIRECT 65.0 04/12/2013 0825    Additional studies/ records that were reviewed today include:   Myoview 06/16/2010    Echo 10/13/2010    ASSESSMENT:    1. Atrial flutter, unspecified type ([place])   2. Medication management   3. OSA on CPAP   4. Hyperlipidemia, unspecified hyperlipidemia type   5. Essential hypertension   6. CKD (chronic kidney disease), stage III      PLAN:  In order of problems listed above:  1. New atrial flutter with controlled heart rate: This appears to be new when compared to the previous EKG. Heart rate 90. Fortunately he  is already on eliquis 5 mg twice a day. He is currently on 18.75 mg BID of carvedilol, I will increase this to 25 mg twice a day. I asked him to monitor his heart rate after starting on carvedilol twice a day. I will also increase his Rhythmol to 300 mg 3 times a day.  2. OSA on BiPAP: Manage by Dr. Claiborne Cordova, will have him follow-up in 3 month  3. HTN: Blood pressure has been high recently, continue on hydralazine, increase carvedilol to 25 mg twice a day  4. HLD: On Lipitor 10 mg daily  5. CKD stage III: Baseline creatinine around 2.0    Medication Adjustments/Labs and Tests Ordered: Current medicines are reviewed at length with the patient today.  Concerns regarding medicines are outlined above.  Medication changes, Labs and Tests ordered today are listed in the Patient Instructions below. Patient Instructions  Medication Instructions:  INCREASE COREG 25MG  TWICE DAILY INCREASE PROPAFENONE 300MG  EVERY 8 HOURS  If you need a refill on your cardiac medications before your next appointment, please call your pharmacy.  Labwork: CBC TODAY AT [facility] ON THE 1ST FLOOR  Follow-Up: Your physician wants you to follow-up in: [place].    Thank you for choosing [facility] at [facility]!!    Gertude Benito, PA-C [place], LPN     Signed, [name], [place]  09/07/2016 5:21 PM    [facility] [address], [place], [place]  [address] Phone: [phone]; Fax: [phone]

## 2016-09-15 ENCOUNTER — Ambulatory Visit (INDEPENDENT_AMBULATORY_CARE_PROVIDER_SITE_OTHER): Payer: Medicare Other | Admitting: Family Medicine

## 2016-09-15 ENCOUNTER — Encounter: Payer: Self-pay | Admitting: Family Medicine

## 2016-09-15 VITALS — BP 132/90 | HR 88 | Temp 97.5°F | Resp 18 | Ht 71.0 in | Wt 246.0 lb

## 2016-09-15 DIAGNOSIS — M7062 Trochanteric bursitis, left hip: Secondary | ICD-10-CM | POA: Diagnosis not present

## 2016-09-15 MED ORDER — PREDNISONE 20 MG PO TABS: ORAL_TABLET | ORAL | 0 refills | Status: DC

## 2016-09-15 NOTE — Progress Notes (Signed)
Subjective:    Patient ID: Martin Cordova, male    DOB: 05-09-1941, 76 y.o.   MRN: [id number]  HPI  Patient is a very pleasant 76 year old white gentleman who is developed a vague pain over the lateral aspect of his left hip directly over the greater trochanter. The pain radiates down the lateral aspect of his hip to just below his left knee made worse by abduction of the hip and external rotation of the hip. It also is worse with laying on the hip. He denies any falls or injuries. Straight leg raise is negative. He is tender to palpation over the greater trochanter. There is no rash or erythema Past Medical History:  Diagnosis Date  . Colon polyps   . Diabetes mellitus without complication ([place])   . Diverticulosis   . GERD 12/07/2006  . HYPERGLYCEMIA 12/07/2006  . HYPERLIPIDEMIA 12/07/2006   mixed wth an atherogenic dyslipidemic pattern  . HYPERTENSION 12/07/2006  . Nephrolithiasis   . Nonalcoholic fatty liver disease   . Normal coronary arteries 04/06/2007   by cath EF 55%., last ECHO 10/13/10 EF>55% mild MR, last nuc 06/16/10 EF 57% low risk scan  . OBESITY 11/10/2009  . PAF (paroxysmal atrial fibrillation) ([place]) 04/09/2013  . Palpitations    tachy  . Pulmonary HTN    RV syst.  40-53mmHg- moderate by echo  . Renal insufficiency    Cr to 3 on ACE  . SLEEP APNEA 10/22/2007   uses bipap  . THROMBOCYTOPENIA 12/07/2006  . TRANSAMINASES, SERUM, ELEVATED 12/07/2006  . 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 Claiborne Billings   Past Surgical History:  Procedure Laterality Date  . CARDIAC CATHETERIZATION  04/06/2007   normal cors  . CARDIOVERSION N/A 08/05/2013   Procedure: CARDIOVERSION;  Surgeon: Troy Sine, MD;  Location: [place];  Service: Cardiovascular;  Laterality: N/A;  . CHOLECYSTECTOMY    . CYSTOSCOPY/RETROGRADE/URETEROSCOPY  08/19/2011   Procedure: CYSTOSCOPY/RETROGRADE/URETEROSCOPY;  Surgeon: Hanley Ben, MD;  Location: [facility];  Service: Urology;   Laterality: Right;  JJ STENT PLACEMENT   . ROTATOR CUFF REPAIR Right    Current Outpatient Prescriptions on File Prior to Visit  Medication Sig Dispense Refill  . atorvastatin (LIPITOR) 10 MG tablet TAKE 1 TABLET BY MOUTH AT BEDTIME 30 tablet 6  . carvedilol (COREG) 25 MG tablet Take 1 tablet (25 mg total) by mouth 2 (two) times daily with a meal. 60 tablet 11  . ELIQUIS 5 MG TABS tablet TAKE 1 TABLET BY MOUTH TWICE A DAY 60 tablet 5  . hydrALAZINE (APRESOLINE) 25 MG tablet TAKE 1 TABLET BY MOUTH TWICE A DAY 60 tablet 4  . Multiple Vitamins-Minerals (CENTRUM SILVER 50+MEN) TABS Take by mouth.    . NON FORMULARY BIPAP therapy    . omega-3 acid ethyl esters (LOVAZA) 1 g capsule TAKE 2 CAPSULES BY MOUTH EVERY DAY 60 capsule 10  . omeprazole (PRILOSEC) 20 MG capsule TAKE 1 CAPSULE (20 MG TOTAL) BY MOUTH DAILY. 30 capsule 11  . potassium citrate (UROCIT-K) 10 MEQ (1080 MG) SR tablet Take 10 mEq by mouth 3 (three) times daily with meals.     . propafenone (RYTHMOL) 300 MG tablet Take 1 tablet (300 mg total) by mouth every 8 (eight) hours. 90 tablet 6  . sildenafil (VIAGRA) 100 MG tablet Take 0.5-1 tablets (50-100 mg total) by mouth daily as needed for erectile dysfunction. 5 tablet 11   No current facility-administered medications on file prior to visit.  Allergies  Allergen Reactions  . Lisinopril Swelling    Renal insufficiency   Social History   Social History  . Marital status: Married    Spouse name: N/A  . Number of children: 4  . Years of education: N/A   Occupational History  . retired Pharmacist, hospital    Social History Main Topics  . Smoking status: Former Smoker    Years: 10.00    Types: Cigarettes    Quit date: 08/04/1980  . Smokeless tobacco: Former Systems developer    Types: Chew    Quit date: 08/08/2004  . Alcohol use 0.0 oz/week     Comment: 1 beer 2-3 times per month  . Drug use: No  . Sexual activity: Yes   Other Topics Concern  . Not on file   Social History Narrative  . No  narrative on file     Review of Systems  All other systems reviewed and are negative.      Objective:   Physical Exam  Cardiovascular: Normal rate, regular rhythm and normal heart sounds.   Pulmonary/Chest: Effort normal and breath sounds normal. No respiratory distress. He has no wheezes. He has no rales.  Musculoskeletal:       Left hip: He exhibits tenderness and bony tenderness. He exhibits normal range of motion, normal strength, no swelling and no crepitus.  Vitals reviewed.         Assessment & Plan:  Greater trochanteric bursitis of left hip  Patient is on anticoagulation making Nsaids unavailable.  We'll begin a prednisone taper pack. Recheck in one week. If pain persists, we can try cortisone injection in the hip. If persists I would also obtain an x-ray of the hip

## 2016-10-02 ENCOUNTER — Other Ambulatory Visit: Payer: Self-pay | Admitting: Cardiovascular Disease

## 2016-10-03 NOTE — Telephone Encounter (Signed)
REFILL 

## 2016-10-04 ENCOUNTER — Other Ambulatory Visit: Payer: Self-pay | Admitting: Cardiovascular Disease

## 2016-10-04 NOTE — Telephone Encounter (Signed)
REFILL 

## 2016-10-05 ENCOUNTER — Telehealth: Payer: Self-pay | Admitting: Cardiovascular Disease

## 2016-10-05 NOTE — Telephone Encounter (Signed)
New message    Pt c/o medication issue:  1. Name of Medication: propafenone (RYTHMOL) 300 MG tablet carvedilol (COREG) 25 MG tablet  2. How are you currently taking this medication (dosage and times per day)? 300 mg, 25 mg  3. Are you having a reaction (difficulty breathing--STAT)?   4. What is your medication issue? 141/81 bp, 91/75 heart rate 154, everything fluctuating per pt thinks it is because of medications listed above

## 2016-10-05 NOTE — Telephone Encounter (Signed)
Spoke with pt, when he saw meng his carvedilol was increased to 25 mg bid and his  increased rythmol to 300 mg tid. Since then he has been feeling more fatigue, SOB when walking 150 ft, but recovers quickly. He had an episode today where he felt breathless and bp was 91/75 154 bpm. Currently his bp is 120/79 69 bpm/ he denies edema or orthopnea. He feels fine at present. Discussed current symptoms and medications with kristin alvstad pharm md, no medication changes at this time, will forward for dr [facility] review and advise.

## 2016-10-07 NOTE — Telephone Encounter (Signed)
Pt says he is still waiting to hear something,he called on Wednesday.

## 2016-10-10 ENCOUNTER — Telehealth: Payer: Self-pay | Admitting: Cardiovascular Disease

## 2016-10-10 NOTE — Telephone Encounter (Signed)
New message    Patient calling checking on the status of last week message to the nurse.

## 2016-10-10 NOTE — Telephone Encounter (Signed)
Patient notified of MD advice. Voiced understanding. He has Rx at home for Rythmol 225mg  q8h. Scheduled to see Dr. Claiborne Billings on 5/17 @ 0800

## 2016-10-10 NOTE — Telephone Encounter (Signed)
Duplicate encounter for same patient issue. This encounter will be closed.

## 2016-10-10 NOTE — Telephone Encounter (Signed)
Please cut the Rythmol back to the previous dose, but remain on the higher dose of carvedilol. He should have a follow-up soon. Please schedule if not already scheduled in the next 2 weeks or so.

## 2016-10-10 NOTE — Telephone Encounter (Signed)
Patient of Dr. Claiborne Billings who saw H. Meng, PA on 4/4  OV 4/4 w/H. Meng, PA 1. New atrial flutter with controlled heart rate: This appears to be new when compared to the previous EKG. Heart rate 90. Fortunately he is already on eliquis 5 mg twice a day. He is currently on 18.75 mg BID of carvedilol, I will increase this to 25 mg twice a day. I asked him to monitor his heart rate after starting on carvedilol twice a day. I will also increase his Rhythmol to 300 mg 3 times a day. 3. HTN: Blood pressure has been high recently, continue on hydralazine, increase carvedilol to 25 mg twice a day  Patient states with any exertion, he gets short of breath (sometimes feels like he will pass out).  This goes away quickly.   He wants to know if he should go back to regular dosage of each medication. -- Carvedilol 18.75mg  BID -- Rythmol 225mg  q8h  135/79  HR 84 148/79  HR 88 145/77  HR 83 91/75  HR 154 - felt really bad but then right away 134/67 120/79 132/66  HR 82  Dr. Claiborne Billings is out of the office - will defer to DOD (Dr. Sallyanne Kuster) for advice

## 2016-10-20 ENCOUNTER — Encounter: Payer: Self-pay | Admitting: Cardiovascular Disease

## 2016-10-20 ENCOUNTER — Ambulatory Visit (INDEPENDENT_AMBULATORY_CARE_PROVIDER_SITE_OTHER): Payer: Medicare Other | Admitting: Cardiovascular Disease

## 2016-10-20 VITALS — BP 124/78 | HR 88 | Ht 71.0 in | Wt 246.0 lb

## 2016-10-20 DIAGNOSIS — I480 Paroxysmal atrial fibrillation: Secondary | ICD-10-CM | POA: Diagnosis not present

## 2016-10-20 DIAGNOSIS — G4733 Obstructive sleep apnea (adult) (pediatric): Secondary | ICD-10-CM

## 2016-10-20 DIAGNOSIS — Z7901 Long term (current) use of anticoagulants: Secondary | ICD-10-CM | POA: Diagnosis not present

## 2016-10-20 DIAGNOSIS — E785 Hyperlipidemia, unspecified: Secondary | ICD-10-CM | POA: Diagnosis not present

## 2016-10-20 DIAGNOSIS — D696 Thrombocytopenia, unspecified: Secondary | ICD-10-CM | POA: Diagnosis not present

## 2016-10-20 DIAGNOSIS — I10 Essential (primary) hypertension: Secondary | ICD-10-CM | POA: Diagnosis not present

## 2016-10-20 MED ORDER — CARVEDILOL 25 MG PO TABS: 37.5000 mg | ORAL_TABLET | Freq: Two times a day (BID) | ORAL | 6 refills | Status: DC

## 2016-10-20 NOTE — Patient Instructions (Signed)
Your physician has recommended you make the following change in your medication:   1.) the carvedilol has been increased to 37.5 mg twice a day. ( 1& 1/2 tablet)  Your physician recommends that you schedule a follow-up appointment week of June 5th.

## 2016-10-20 NOTE — Progress Notes (Signed)
Patient ID: Martin Cordova, male   DOB: March 04, 1941, 76 y.o.   MRN: [id number]      HPI: Martin Cordova is a 76 y.o. male is who presents to the office today for a 8 month follow-up cardiology evaluation.  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 In the past, he developed an obstructive uropathy attributed to kidney stones; creatinine had risen up to 3 and ultimately improved and stabilized at approximately 1.7. Martin Cordova uses his BiPAP daily and does note good sleep.  In the past he had issues with mild peripheral edema, which ultimately improved.  He had been previously taken off his lisinopril when his creatinine had risen in the setting of his obstructive uropathy.  In 2015 he developed recurrent episodes of palpitations and was found to have recurrent paroxysmal atrial fibrillation/flutter.  His medications were adjusted and  he was started on antiarrhythmic therapy with Rythmol to take in addition to his increasing doses of carvedilol.  He was started on Eliquis for anticoagulation.  He underwent successful cardioversion on 08/05/2013.  Since that time, he is unaware of any recurrent atrial fibrillation.  He has noticed more energy. He had been maintained on 50 mg twice a day of carvedilol in addition to Rythmol 225 mg every 8 hours, but due to slow pulse rates, these doses have ultimately been reduced.  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 He has been on CPAP therapy initially for approximately 8 years and has been on BiPAP auto SV for over 4 years. He admits to using his BiPAP with 100% compliance.  He obtained a new BiPAP machine in June.  He feels that his machine is significantly  improved from his prior one. When I last saw him I reviewed his  download of his new BiPAP auto SV machine indicates that 90% of the time is EPAP pressure was 17 and 90% of the time his pressure support was 5.8, giving an IPAP of 21.8 cm.  He is using it 100% of the time and is averaging 5 hours and 49 minutes on his most recent download from 12/07/2013 through 01/05/2014.  His average AHI was 7.5.  His device setting maximum BiPAP pressure is 25 cm in maximum EPAP pressure 21 cm.  His average central event index is 1.3, and average obstructive apneic index 0.2, with an average copy index of 6.0.  Average breath.  Rate is 18 breaths per minute with a minute ventilation of 12.1.  Average total body may 670 mL.  When I saw him on 10/21/15 he was unaware of any arrhythmia.  He was on carvedilol 12.5 mg twice a day, Rythmol 225 mg twice a day, hydralazine 25 mg twice a day and eliquis  5 mg twice a day.  He also has been taking omeprazole for GERD and atorvastatin 10 mg for hyperlipidemia. He was found to have recurrent atrial flutter with variable block.  At that time, I recommended that he increase his Rythmol and take 225 mg every 8 hours and further increased his carvedilol to 18.75 mg twice a day.  Laboratory had revealed a magnesium of 2.0.  He has stage III chronic kidney disease and  his BUN was 26, creatinine 1.71 with a GFR estimate of 39.  Potassium was 4.8.  TSH was normal at 2.4.  Insomnia.  One week later in follow-up he had reverted back to sinus rhythm..  When I last saw him in September 2017, he was maintaining sinus rhythm.  Since then, he admits to weight gain.  In early April, he began to notice some increasing shortness of breath and elevated heart rate and fatigue.  He was seen by how Martin Cordova on 09/07/2016 and was found to be back in atrial flutter with a ventricular rate at 90.  He has been on eloquence 5 g twice a day for anticoagulation and was on carvedilol 18.75 twice a day.  Carvedilol dose  was increased to 25 mg twice a day.  He was also told at that time to try increasing his Martin Cordova and known to 300 mg 3 times a day.  He took this for several days but did not tolerate the increased dose and reduced it back to its previous dose of 225 mg every 8 hours.  He denies chest pain.  He presents for reevaluation.  Past Medical History  Diagnosis Date  . GERD 12/07/2006  . HYPERGLYCEMIA 12/07/2006  . HYPERLIPIDEMIA 12/07/2006    mixed wth an atherogenic dyslipidemic pattern  . HYPERTENSION 12/07/2006  . OBESITY 11/10/2009  . THROMBOCYTOPENIA 12/07/2006  . TRANSAMINASES, SERUM, ELEVATED 12/07/2006  . SLEEP APNEA 10/22/2007    uses bipap  . 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 Claiborne Billings  . Diabetes mellitus without complication   . Renal insufficiency     Cr to 3 on ACE  . Nephrolithiasis   . Normal coronary arteries 04/06/2007    by cath EF 55%., last ECHO 10/13/10 EF>55% mild MR, last nuc 06/16/10 EF 57% low risk scan  . Pulmonary HTN     RV syst.  40-24mHg- moderate by echo  . Palpitations     tachy  . PAF (paroxysmal atrial fibrillation) 04/09/2013    Past Surgical History  Procedure Laterality Date  . Cholecystectomy    . Rotator cuff repair    . Cystoscopy/retrograde/ureteroscopy  08/19/2011    Procedure: CYSTOSCOPY/RETROGRADE/URETEROSCOPY;  Surgeon: MHanley Ben MD;  [facility]  Service: Urology;  Laterality: Right;  JJ STENT PLACEMENT   . Cardiac catheterization  04/06/2007    normal cors    Allergies  Allergen Reactions  . Lisinopril     Renal insufficiency    Current Outpatient Prescriptions:  .  atorvastatin (LIPITOR) 10 MG tablet, TAKE 1 TABLET BY MOUTH AT BEDTIME, Disp: 30 tablet, Rfl: 6 .  carvedilol (COREG) 25 MG tablet, Take 1.5 tablets (37.5 mg total) by mouth 2 (two) times daily with a meal., Disp: 90 tablet, Rfl: 6 .  ELIQUIS 5 MG TABS tablet, TAKE 1 TABLET BY MOUTH TWICE A DAY, Disp: 60 tablet, Rfl: 5 .  hydrALAZINE  (APRESOLINE) 25 MG tablet, TAKE 1 TABLET BY MOUTH TWICE A DAY, Disp: 60 tablet, Rfl: 4 .  Multiple Vitamins-Minerals (CENTRUM SILVER 50+MEN) TABS, Take by mouth., Disp: , Rfl:  .  NON FORMULARY, BIPAP therapy, Disp: , Rfl:  .  omega-3 acid ethyl esters (LOVAZA) 1 g capsule, TAKE 2 CAPSULES BY MOUTH EVERY DAY, Disp: 60 capsule, Rfl: 4 .  omeprazole (PRILOSEC) 20 MG capsule, TAKE 1 CAPSULE (20 MG TOTAL) BY MOUTH DAILY., Disp: 30 capsule, Rfl: 11 .  potassium citrate (UROCIT-K) 10 MEQ (1080 MG) SR tablet, Take 10  mEq by mouth 3 (three) times daily with meals. , Disp: , Rfl:  .  propafenone (RYTHMOL) 225 MG tablet, Take 225 mg by mouth every 8 (eight) hours., Disp: , Rfl:  .  sildenafil (VIAGRA) 100 MG tablet, Take 0.5-1 tablets (50-100 mg total) by mouth daily as needed for erectile dysfunction., Disp: 5 tablet, Rfl: 11  Socially he is married has 4 children and 2 grandchildren. He is a distant relative to the "Biermann brothers." He does try to walk and exercise. There is no tobacco use. He does drink occasional alcohol.   ROS General: Negative; No fevers, chills, or night sweats;  HEENT: Negative; No changes in vision or hearing, sinus congestion, difficulty swallowing Pulmonary: Negative; No cough, wheezing, shortness of breath, hemoptysis Cardiovascular: See history of present illness No recent peripheral edema GI: Negative; No nausea, vomiting, diarrhea, or abdominal pain GU: Renal function has stabilized; No dysuria, hematuria, or difficulty voiding; some difficulty with erectile function Musculoskeletal: Negative; no myalgias, joint pain, or weakness Hematologic/Oncology: Negative; no easy bruising, bleeding Endocrine: Negative; no heat/cold intolerance; no diabetes Neuro: Negative; no changes in balance, headaches Skin: Negative; No rashes or skin lesions Psychiatric: Negative; No behavioral problems, depression Sleep: He is using his BiPAP therapy with 100% compliance.  No snoring,  daytime sleepiness, hypersomnolence, bruxism, restless legs, hypnogognic hallucinations, no cataplexy Other comprehensive 14 point system review is negative.  PE BP 124/78   Pulse 88   Ht '5\' 11"'  (1.803 m)   Wt 246 lb (111.6 kg)   BMI 34.31 kg/m    Repeat blood pressure by me was 133/80  Wt Readings from Last 3 Encounters:  10/20/16 246 lb (111.6 kg)  09/15/16 246 lb (111.6 kg)  09/07/16 251 lb (113.9 kg)   General: Alert, oriented, no distress.  Skin: normal turgor, no rashes, warm and dry HEENT: Normocephalic, atraumatic. Pupils equal round and reactive to light; sclera anicteric; extraocular muscles intact;  Nose without nasal septal hypertrophy Mouth/Parynx benign; Mallinpatti scale 3 Neck: No JVD, no carotid bruits; normal carotid upstroke Lungs: clear to ausculatation and percussion; no wheezing or rales Chest wall: without tenderness to palpitation Heart: Heart rate increased to the upper 80s to low 90s with mild variability, s1 s2 normal, 1/6 systolic murmur, no diastolic murmur, no rubs, gallops, thrills, or heaves Abdomen: Central adiposity, moderate ; soft, nontender; no hepatosplenomehaly, BS+; abdominal aorta nontender and not dilated by palpation. Back: no CVA tenderness Pulses 2+ Musculoskeletal: full range of motion, normal strength, no joint deformities Extremities: no clubbing cyanosis or edema, Homan's sign negative  Neurologic: grossly nonfocal; Cranial nerves grossly wnl Psychologic: Normal mood and affect; normal cognitive function   ECG (independently read by me): atrial flutter with variable block with ventricular rate at 89 bpm.  Right bundle-branch block, left anterior hemiblock.  September 2017 ECG (independently read by me): Normal sinus rhythm at 63 bpm.  First-degree AV block with a PR interval of 208 ms.  Right bundle branch block with repolarization changes.  QTc interval 466 ms.  10/28/15 ECG (independently read by me): Sinus bradycardia at 54  bpm.  First degree block with PR interval 222 ms.  Right bundle-branch block.  05/17/2017ECG (independently read by me): Atrial flutter with variable block.  Right bundle-branch block with repolarization changes.  October 2016 ECG (independently read by me): sinus rhythm with first-degree AV block with a PR interval at 216 ms.  Ventricular rate 86 bpm with occasional PVCs.    May 2016ECG (independently read by me):  Normal sinus rhythm at 60 bpm.  Right bundle-branch block with repolarization changes.  November 2015 ECG (independently read by me): Normal sinus rhythm at 61.  Nonspecific T abnormality.  QTc interval 394 ms.  11/18/2013 ECG (independently read by me): Sinus bradycardia 58 beats per minute.  PR interval 198 ms; QTc interval 371 ms.  Nonspecific ST changes.  07/29/2013 ECG  (independently read by me): Atrial flutter with 2:1 block with a ventricular rate of 87 beats per minute. Atrial rate is approximately 360 ms. 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 fib flutter now with right bundle branch block and repolarization changes.  Prior ECG of 06/04/2013: EKG  suggests probable atrial flutter with a ventricular rate of 105 beats per minute. There also are frequent PVCs. QTc interval is 436 ms. They're nonspecific T changes.  LABS: BMP Latest Ref Rng & Units 05/17/2016 11/16/2015 10/26/2015  Glucose 70 - 140 mg/dl 100 85 113(H)  BUN 7.0 - 26.0 mg/dL 25.8 22.6 26(H)  Creatinine 0.7 - 1.3 mg/dL 2.0(H) 1.7(H) 1.71(H)  Sodium 136 - 145 mEq/L 143 143 142  Potassium 3.5 - 5.1 mEq/L 5.2(H) 4.8 4.8  Chloride 98 - 110 mmol/L - - 103  CO2 22 - 29 mEq/L 27 30(H) 25  Calcium 8.4 - 10.4 mg/dL 9.6 9.5 9.1   Hepatic Function Latest Ref Rng & Units 05/17/2016 11/16/2015 10/26/2015  Total Protein 6.4 - 8.3 g/dL 7.3 7.0 6.7  Albumin 3.5 - 5.0 g/dL 3.4(L) 3.8 4.3  AST 5 - 34 U/L '22 17 18  ' ALT 0 - 55 U/L '26 24 25  ' Alk Phosphatase  40 - 150 U/L 51 38(L) 37(L)  Total Bilirubin 0.20 - 1.20 mg/dL 0.56 0.88 1.1  Bilirubin, Direct 0.0 - 0.3 mg/dL - - -   CBC Latest Ref Rng & Units 09/07/2016 05/17/2016 11/16/2015  WBC 3.8 - 10.8 K/uL 6.4 6.5 4.8  Hemoglobin 13.2 - 17.1 g/dL 14.9 14.2 14.0  Hematocrit 38.5 - 50.0 % 44.6 42.5 41.2  Platelets 140 - 400 K/uL 127(L) 123(L) 100(L)   Lab Results  Component Value Date   TSH 2.40 10/26/2015   Lipid Panel     Component Value Date/Time   CHOL 93 (L) 10/26/2015 0819   TRIG 130 10/26/2015 0819   HDL 32 (L) 10/26/2015 0819   CHOLHDL 2.9 10/26/2015 0819   VLDL 26 10/26/2015 0819   LDLCALC 35 10/26/2015 0819   LDLDIRECT 65.0 04/12/2013 0825     RADIOLOGY: No results found.  IMPRESSION:  1. PAF (paroxysmal atrial fibrillation) ([place])   2. Essential hypertension   3. Hyperlipidemia LDL goal <70   4. Chronic anticoagulation   5. OSA (obstructive sleep apnea)   6. Thrombocytopenia ([place])     ASSESSMENT AND PLAN: Martin Cordova is a 76 year old Caucasian male who has documented normal coronary arteries by heart catheterization in 2008. He has a history of hypertension, mixed hyperlipidemia, and has paroxysmal atrial fibrillation/flutter.  He had lost a significant amount of weight over the years and his weight had been fairly stable , but over the past year, he has regained the majority of this weight back.  Previously, with his weight loss he had developed slow pulses in the past leading to reduction of his medical regimen. He developed increasing fatigability and shortness of breath.  In April and was found to be back in atrial flutter.  He is now on carvedilol at 25 mg twice a day in  addition to [place] and known to 25 every 8 hours.  He did not tolerate an attempt at increased dose of his antiarrhythmic agent.  His ECG today confirms that he is still not in sinus rhythm.  I will further increase carvedilol to 37.5 mg twice a day.  Laboratory will be rechecked.  He has obstructive  sleep apnea and is on BiPAP therapy.  Will obtain a download to make certain he is being appropriately treated.  I will seen in the office in 3 weeks for follow-up evaluation and if that at that time, he still in atrial flutter plans for cardioversion will be made.  In addition, in the past.  He has had thrombocytopenia and was referred to Dr. Irene Limbo for evaluation.  The patient's mother had ITP.  Will need to continue to monitor this closely. He has previously noted multiple large kidney cysts.    Time spent 25 minutes Shelva Majestic, MD  10/22/2016  11:12 AM

## 2016-10-25 ENCOUNTER — Encounter: Payer: Self-pay | Admitting: Family Medicine

## 2016-10-25 ENCOUNTER — Ambulatory Visit (INDEPENDENT_AMBULATORY_CARE_PROVIDER_SITE_OTHER): Payer: Medicare Other | Admitting: Family Medicine

## 2016-10-25 VITALS — BP 142/88 | HR 76 | Temp 97.5°F | Resp 16 | Ht 71.0 in | Wt 249.0 lb

## 2016-10-25 DIAGNOSIS — Z125 Encounter for screening for malignant neoplasm of prostate: Secondary | ICD-10-CM | POA: Diagnosis not present

## 2016-10-25 DIAGNOSIS — I480 Paroxysmal atrial fibrillation: Secondary | ICD-10-CM | POA: Diagnosis not present

## 2016-10-25 DIAGNOSIS — Z0000 Encounter for general adult medical examination without abnormal findings: Secondary | ICD-10-CM | POA: Diagnosis not present

## 2016-10-25 DIAGNOSIS — E119 Type 2 diabetes mellitus without complications: Secondary | ICD-10-CM

## 2016-10-25 DIAGNOSIS — E785 Hyperlipidemia, unspecified: Secondary | ICD-10-CM

## 2016-10-25 LAB — COMPLETE METABOLIC PANEL WITH GFR
ALT: 25 U/L (ref 9–46)
AST: 19 U/L (ref 10–35)
Albumin: 4.3 g/dL (ref 3.6–5.1)
Alkaline Phosphatase: 39 U/L — ABNORMAL LOW (ref 40–115)
BUN: 24 mg/dL (ref 7–25)
CO2: 22 mmol/L (ref 20–31)
Calcium: 9.3 mg/dL (ref 8.6–10.3)
Chloride: 106 mmol/L (ref 98–110)
Creat: 2.08 mg/dL — ABNORMAL HIGH (ref 0.70–1.18)
GFR, Est African American: 35 mL/min — ABNORMAL LOW (ref >=60)
GFR, Est Non African American: 30 mL/min — ABNORMAL LOW (ref >=60)
Glucose, Bld: 123 mg/dL — ABNORMAL HIGH (ref 70–99)
Potassium: 4.5 mmol/L (ref 3.5–5.3)
Sodium: 141 mmol/L (ref 135–146)
Total Bilirubin: 0.8 mg/dL (ref 0.2–1.2)
Total Protein: 6.8 g/dL (ref 6.1–8.1)

## 2016-10-25 LAB — CBC WITH DIFFERENTIAL/PLATELET
Basophils Absolute: 0 cells/uL (ref 0–200)
Basophils Relative: 0 %
Eosinophils Absolute: 96 cells/uL (ref 15–500)
Eosinophils Relative: 2 %
HCT: 43.9 % (ref 38.5–50.0)
Hemoglobin: 14.4 g/dL (ref 13.0–17.0)
Lymphocytes Relative: 19 %
Lymphs Abs: 912 cells/uL (ref 850–3900)
MCH: 30.6 pg (ref 27.0–33.0)
MCHC: 32.8 g/dL (ref 32.0–36.0)
MCV: 93.2 fL (ref 80.0–100.0)
MPV: 11.3 fL (ref 7.5–12.5)
Monocytes Absolute: 480 cells/uL (ref 200–950)
Monocytes Relative: 10 %
Neutro Abs: 3312 cells/uL (ref 1500–7800)
Neutrophils Relative %: 69 %
Platelets: 111 10*3/uL — ABNORMAL LOW (ref 140–400)
RBC: 4.71 MIL/uL (ref 4.20–5.80)
RDW: 14.3 % (ref 11.0–15.0)
WBC: 4.8 10*3/uL (ref 3.8–10.8)

## 2016-10-25 LAB — LIPID PANEL
Cholesterol: 110 mg/dL (ref <200)
HDL: 32 mg/dL — ABNORMAL LOW (ref >40)
LDL Cholesterol: 41 mg/dL (ref <100)
Total CHOL/HDL Ratio: 3.4 Ratio (ref <5.0)
Triglycerides: 185 mg/dL — ABNORMAL HIGH (ref <150)
VLDL: 37 mg/dL — ABNORMAL HIGH (ref <30)

## 2016-10-25 NOTE — Progress Notes (Signed)
Subjective:    Patient ID: Martin Cordova, male    DOB: [birth year], 76 y.o.   MRN: [id number]  HPI Patient is a very pleasant 76 year old Caucasian male here today for complete physical exam. Last colonoscopy was in 2015 and due to the number of colon polyps, it was recommended to be repeated in 2018. He is due later this year. Patient is waiting to hear from his gastroenterologist but if they do not contact him, he will call me and I will be glad to set this up. He is due for a PSA to screen for prostate cancer. His age does not qualify for hepatitis C or HIV screening. He denies any risk factors for this. Diabetic eye exam is up-to-date he goes annually. He admits that he has been inconsistent with his diet and he is gaining back the weight he previously lost. Therefore he is concerned that his hemoglobin A1c may be elevated. Otherwise he is doing well with no concerns. I did find a small tic on the posterior aspect of his left shoulder and I removed out of the forceps. Otherwise he is asymptomatic from a tick bite Past Medical History:  Diagnosis Date  . Colon polyps   . Diabetes mellitus without complication ([place])   . Diverticulosis   . GERD 12/07/2006  . HYPERGLYCEMIA 12/07/2006  . HYPERLIPIDEMIA 12/07/2006   mixed wth an atherogenic dyslipidemic pattern  . HYPERTENSION 12/07/2006  . Nephrolithiasis   . Nonalcoholic fatty liver disease   . Normal coronary arteries 04/06/2007   by cath EF 55%., last ECHO 10/13/10 EF>55% mild MR, last nuc 06/16/10 EF 57% low risk scan  . OBESITY 11/10/2009  . PAF (paroxysmal atrial fibrillation) ([place]) 04/09/2013  . Palpitations    tachy  . Pulmonary HTN ([facility])    RV syst.  40-37mmHg- moderate by echo  . Renal insufficiency    Cr to 3 on ACE  . SLEEP APNEA 10/22/2007   uses bipap  . THROMBOCYTOPENIA 12/07/2006  . TRANSAMINASES, SERUM, ELEVATED 12/07/2006  . 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 Claiborne Billings   Past Surgical History:  Procedure  Laterality Date  . CARDIAC CATHETERIZATION  04/06/2007   normal cors  . CARDIOVERSION N/A 08/05/2013   Procedure: CARDIOVERSION;  Surgeon: Troy Sine, MD;  Location: [facility];  Service: Cardiovascular;  Laterality: N/A;  . CHOLECYSTECTOMY    . CYSTOSCOPY/RETROGRADE/URETEROSCOPY  08/19/2011   Procedure: CYSTOSCOPY/RETROGRADE/URETEROSCOPY;  Surgeon: Hanley Ben, MD;  Location: [facility];  Service: Urology;  Laterality: Right;  JJ STENT PLACEMENT   . ROTATOR CUFF REPAIR Right    Current Outpatient Prescriptions on File Prior to Visit  Medication Sig Dispense Refill  . atorvastatin (LIPITOR) 10 MG tablet TAKE 1 TABLET BY MOUTH AT BEDTIME 30 tablet 6  . carvedilol (COREG) 25 MG tablet Take 1.5 tablets (37.5 mg total) by mouth 2 (two) times daily with a meal. 90 tablet 6  . ELIQUIS 5 MG TABS tablet TAKE 1 TABLET BY MOUTH TWICE A DAY 60 tablet 5  . hydrALAZINE (APRESOLINE) 25 MG tablet TAKE 1 TABLET BY MOUTH TWICE A DAY 60 tablet 4  . Multiple Vitamins-Minerals (CENTRUM SILVER 50+MEN) TABS Take by mouth.    . NON FORMULARY BIPAP therapy    . omega-3 acid ethyl esters (LOVAZA) 1 g capsule TAKE 2 CAPSULES BY MOUTH EVERY DAY 60 capsule 4  . omeprazole (PRILOSEC) 20 MG capsule TAKE 1 CAPSULE (20 MG TOTAL) BY MOUTH DAILY. 30 capsule 11  .  potassium citrate (UROCIT-K) 10 MEQ (1080 MG) SR tablet Take 10 mEq by mouth 3 (three) times daily with meals.     . propafenone (RYTHMOL) 225 MG tablet Take 225 mg by mouth every 8 (eight) hours.    . sildenafil (VIAGRA) 100 MG tablet Take 0.5-1 tablets (50-100 mg total) by mouth daily as needed for erectile dysfunction. 5 tablet 11   No current facility-administered medications on file prior to visit.    Allergies  Allergen Reactions  . Lisinopril Swelling    Renal insufficiency   Social History   Social History  . Marital status: Married    Spouse name: N/A  . Number of children: 4  . Years of education: N/A   Occupational  History  . retired Pharmacist, hospital    Social History Main Topics  . Smoking status: Former Smoker    Years: 10.00    Types: Cigarettes    Quit date: 08/04/1980  . Smokeless tobacco: Former Systems developer    Types: Chew    Quit date: 08/08/2004  . Alcohol use 0.0 oz/week     Comment: 1 beer 2-3 times per month  . Drug use: No  . Sexual activity: Yes   Other Topics Concern  . Not on file   Social History Narrative  . No narrative on file     Review of Systems  All other systems reviewed and are negative.      Objective:   Physical Exam  Constitutional: He is oriented to person, place, and time. He appears well-developed and well-nourished. No distress.  Neck: No JVD present. No thyromegaly present.  Cardiovascular: Normal rate, regular rhythm, normal heart sounds and intact distal pulses.   No murmur heard. Pulmonary/Chest: Effort normal and breath sounds normal. No respiratory distress. He has no wheezes. He has no rales.  Abdominal: Soft. Bowel sounds are normal. He exhibits no distension and no mass. There is no tenderness. There is no rebound and no guarding.  Musculoskeletal: He exhibits no edema.  Lymphadenopathy:    He has no cervical adenopathy.  Neurological: He is alert and oriented to person, place, and time. He has normal reflexes. No cranial nerve deficit. He exhibits normal muscle tone. Coordination normal.  Skin: He is not diaphoretic.  Vitals reviewed.  Wt Readings from Last 3 Encounters:  10/25/16 249 lb (112.9 kg)  10/20/16 246 lb (111.6 kg)  09/15/16 246 lb (111.6 kg)          Assessment & Plan:  Controlled type 2 diabetes mellitus without complication, without long-term current use of insulin ([place]) - Plan: CBC with Differential/Platelet, COMPLETE METABOLIC PANEL WITH GFR, Lipid panel, Hemoglobin A1c, Microalbumin, urine  Prostate cancer screening - Plan: PSA  PAF (paroxysmal atrial fibrillation) ([place])  Dyslipidemia  General medical exam  Physical exam  today is normal aside from the tic. Patient will contact his gastroenterologist to schedule the colonoscopy. Diabetic foot exam and diabetic eye exam up-to-date. He declines hepatitis C screening. Immunizations are up-to-date as listed below. I will check a CBC, CMP, fasting lipid panel, hemoglobin A1c, and a urine microalbumin. Also screen for prostate cancer with PSA. We discussed diet exercise and weight loss and he will start changing his diet again to achieve weight loss he previously experienced. Immunization History  Administered Date(s) Administered  . Influenza Split 03/06/2012  . Influenza Whole 03/19/2009, 03/06/2010  . Influenza, High Dose Seasonal PF 02/08/2016  . Influenza,inj,Quad PF,36+ Mos 02/23/2013  . Influenza-Unspecified 04/06/2014, 03/28/2015  . Pneumococcal Conjugate-13 06/20/2014  .  Pneumococcal Polysaccharide-23 06/16/2008  . Td 01/25/1999, 07/06/2009  . Tdap 06/20/2014  . Zoster 02/09/2013   He will monitor for any signs of tickborne illness

## 2016-10-26 ENCOUNTER — Encounter: Payer: Self-pay | Admitting: Family Medicine

## 2016-10-26 LAB — PSA: PSA: 1.2 ng/mL (ref <=4.0)

## 2016-10-26 LAB — HEMOGLOBIN A1C
Hgb A1c MFr Bld: 6.4 % — ABNORMAL HIGH (ref <5.7)
Mean Plasma Glucose: 137 mg/dL

## 2016-10-26 LAB — MICROALBUMIN, URINE: Microalb, Ur: 11.2 mg/dL

## 2016-11-08 ENCOUNTER — Encounter: Payer: Self-pay | Admitting: Cardiovascular Disease

## 2016-11-08 ENCOUNTER — Ambulatory Visit (INDEPENDENT_AMBULATORY_CARE_PROVIDER_SITE_OTHER): Payer: Medicare Other | Admitting: Cardiovascular Disease

## 2016-11-08 VITALS — BP 144/102 | HR 85 | Ht 71.0 in | Wt 254.0 lb

## 2016-11-08 DIAGNOSIS — G4733 Obstructive sleep apnea (adult) (pediatric): Secondary | ICD-10-CM

## 2016-11-08 DIAGNOSIS — I4892 Unspecified atrial flutter: Secondary | ICD-10-CM

## 2016-11-08 DIAGNOSIS — Z7901 Long term (current) use of anticoagulants: Secondary | ICD-10-CM | POA: Diagnosis not present

## 2016-11-08 DIAGNOSIS — N183 Chronic kidney disease, stage 3 unspecified: Secondary | ICD-10-CM

## 2016-11-08 DIAGNOSIS — I10 Essential (primary) hypertension: Secondary | ICD-10-CM

## 2016-11-08 DIAGNOSIS — E785 Hyperlipidemia, unspecified: Secondary | ICD-10-CM

## 2016-11-08 DIAGNOSIS — Z9989 Dependence on other enabling machines and devices: Secondary | ICD-10-CM

## 2016-11-08 MED ORDER — DILTIAZEM HCL ER COATED BEADS 180 MG PO CP24: 180.0000 mg | ORAL_CAPSULE | Freq: Every day | ORAL | 5 refills | Status: DC

## 2016-11-08 MED ORDER — HYDROCHLOROTHIAZIDE 25 MG PO TABS: 12.5000 mg | ORAL_TABLET | Freq: Every day | ORAL | 5 refills | Status: DC

## 2016-11-08 NOTE — Progress Notes (Signed)
Patient ID: Martin Cordova, male   DOB: 09-Aug-1940, 76 y.o.   MRN: [id number]      HPI: Martin Cordova is a 76 y.o. male is who presents to the office today for a 8 month follow-up cardiology evaluation.  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 In the past, he developed an obstructive uropathy attributed to kidney stones; creatinine had risen up to 3 and ultimately improved and stabilized at approximately 1.7. [name] uses his BiPAP daily and does note good sleep.  In the past he had issues with mild peripheral edema, which ultimately improved.  He had been previously taken off his lisinopril when his creatinine had risen in the setting of his obstructive uropathy.  In 2015 he developed recurrent episodes of palpitations and was found to have recurrent paroxysmal atrial fibrillation/flutter.  His medications were adjusted and  he was started on antiarrhythmic therapy with Rythmol to take in addition to his increasing doses of carvedilol.  He was started on Eliquis for anticoagulation.  He underwent successful cardioversion on 08/05/2013.  Since that time, he is unaware of any recurrent atrial fibrillation.  He has noticed more energy. He had been maintained on 50 mg twice a day of carvedilol in addition to Rythmol 225 mg every 8 hours, but due to slow pulse rates, these doses have ultimately been reduced.  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 He has been on CPAP therapy initially for approximately 8 years and has been on BiPAP auto SV for over 4 years. He admits to using his BiPAP with 100% compliance.  He obtained a new BiPAP machine in June.  He feels that his machine is significantly  improved from his prior one. When I last saw him I reviewed his  download of his new BiPAP auto SV machine indicates that 90% of the time is EPAP pressure was 17 and 90% of the time his pressure support was 5.8, giving an IPAP of 21.8 cm.  He is using it 100% of the time and is averaging 5 hours and 49 minutes on his most recent download from 12/07/2013 through 01/05/2014.  His average AHI was 7.5.  His device setting maximum BiPAP pressure is 25 cm in maximum EPAP pressure 21 cm.  His average central event index is 1.3, and average obstructive apneic index 0.2, with an average copy index of 6.0.  Average breath.  Rate is 18 breaths per minute with a minute ventilation of 12.1.  Average total body may 670 mL.  When I saw him on 10/21/15 he was unaware of any arrhythmia.  He was on carvedilol 12.5 mg twice a day, Rythmol 225 mg twice a day, hydralazine 25 mg twice a day and eliquis  5 mg twice a day.  He also has been taking omeprazole for GERD and atorvastatin 10 mg for hyperlipidemia. He was found to have recurrent atrial flutter with variable block.  At that time, I recommended that he increase his Rythmol and take 225 mg every 8 hours and further increased his carvedilol to 18.75 mg twice a day.  Laboratory had revealed a magnesium of 2.0.  He has stage III chronic kidney disease and  his BUN was 26, creatinine 1.71 with a GFR estimate of 39.  Potassium was 4.8.  TSH was normal at 2.4.  Insomnia.  One week later in follow-up he had reverted back to sinus rhythm..  When I saw him in September 2017, he was maintaining sinus rhythm.  Since then, he admits to weight gain.  In early April, he began to notice some increasing shortness of breath and elevated heart rate and fatigue.  He was seen by [name] on 09/07/2016 and was found to be back in atrial flutter with a ventricular rate at 90.  He has been on eloquence 5 g twice a day for anticoagulation and was on carvedilol 18.75 twice a day.  Carvedilol dose was  increased to 25 mg twice a day.  He was also told at that time to try increasing his Propofenone to 300 mg 3 times a day.  He took this for several days but did not tolerate the increased dose and reduced it back to its previous dose of 225 mg every 8 hours.  When I saw him on 10/20/2016  I increased carvedilol to 37.5 mg twice a day.  He has continued to use his BiPAP therapy for sleep apnea.  He presents to the office today for follow-up evaluation.      Past Medical History  Diagnosis Date  . GERD 12/07/2006  . HYPERGLYCEMIA 12/07/2006  . HYPERLIPIDEMIA 12/07/2006    mixed wth an atherogenic dyslipidemic pattern  . HYPERTENSION 12/07/2006  . OBESITY 11/10/2009  . THROMBOCYTOPENIA 12/07/2006  . TRANSAMINASES, SERUM, ELEVATED 12/07/2006  . SLEEP APNEA 10/22/2007    uses bipap  . 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 Claiborne Billings  . Diabetes mellitus without complication   . Renal insufficiency     Cr to 3 on ACE  . Nephrolithiasis   . Normal coronary arteries 04/06/2007    by cath EF 55%., last ECHO 10/13/10 EF>55% mild MR, last nuc 06/16/10 EF 57% low risk scan  . Pulmonary HTN     RV syst.  40-8mHg- moderate by echo  . Palpitations     tachy  . PAF (paroxysmal atrial fibrillation) 04/09/2013    Past Surgical History  Procedure Laterality Date  . Cholecystectomy    . Rotator cuff repair    . Cystoscopy/retrograde/ureteroscopy  08/19/2011    Procedure: CYSTOSCOPY/RETROGRADE/URETEROSCOPY;  Surgeon: MHanley Ben MD;  [facility]  Service: Urology;  Laterality: Right;  JJ STENT PLACEMENT   . Cardiac catheterization  04/06/2007    normal cors    Allergies  Allergen Reactions  . Lisinopril     Renal insufficiency    Current Outpatient Prescriptions:  .  atorvastatin (LIPITOR) 10 MG tablet, TAKE 1 TABLET BY MOUTH AT BEDTIME, Disp: 30 tablet, Rfl: 6 .  carvedilol (COREG) 25 MG tablet, Take 1.5 tablets (37.5 mg total) by mouth 2 (two) times daily with  a meal., Disp: 90 tablet, Rfl: 6 .  ELIQUIS 5 MG TABS tablet, TAKE 1 TABLET BY MOUTH TWICE A DAY, Disp: 60 tablet, Rfl: 5 .  Multiple Vitamins-Minerals (CENTRUM SILVER 50+MEN) TABS, Take by mouth., Disp: , Rfl:  .  NON FORMULARY, BIPAP therapy, Disp: , Rfl:  .  omega-3 acid ethyl esters (LOVAZA) 1 g capsule, TAKE 2 CAPSULES BY MOUTH EVERY DAY, Disp: 60 capsule, Rfl: 4 .  omeprazole (PRILOSEC) 20 MG capsule, TAKE 1 CAPSULE (20 MG TOTAL) BY MOUTH DAILY., Disp: 30 capsule, Rfl: 11 .  potassium  citrate (UROCIT-K) 10 MEQ (1080 MG) SR tablet, Take 10 mEq by mouth 3 (three) times daily with meals. , Disp: , Rfl:  .  propafenone (RYTHMOL) 225 MG tablet, Take 225 mg by mouth every 8 (eight) hours., Disp: , Rfl:  .  sildenafil (VIAGRA) 100 MG tablet, Take 0.5-1 tablets (50-100 mg total) by mouth daily as needed for erectile dysfunction., Disp: 5 tablet, Rfl: 11 .  diltiazem (CARDIZEM CD) 180 MG 24 hr capsule, Take 1 capsule (180 mg total) by mouth daily., Disp: 30 capsule, Rfl: 5 .  hydrochlorothiazide (HYDRODIURIL) 25 MG tablet, Take 0.5 tablets (12.5 mg total) by mouth daily., Disp: 30 tablet, Rfl: 5  Socially he is married has 4 children and 2 grandchildren. He is a distant relative to the "Hoogendoorn brothers." He does try to walk and exercise. There is no tobacco use. He does drink occasional alcohol.   ROS General: Negative; No fevers, chills, or night sweats;  HEENT: Negative; No changes in vision or hearing, sinus congestion, difficulty swallowing Pulmonary: Negative; No cough, wheezing, shortness of breath, hemoptysis Cardiovascular: See history of present illness No recent peripheral edema GI: Negative; No nausea, vomiting, diarrhea, or abdominal pain GU: Renal function has stabilized; No dysuria, hematuria, or difficulty voiding; some difficulty with erectile function Musculoskeletal: Negative; no myalgias, joint pain, or weakness Hematologic/Oncology: Negative; no easy bruising,  bleeding Endocrine: Negative; no heat/cold intolerance; no diabetes Neuro: Negative; no changes in balance, headaches Skin: Negative; No rashes or skin lesions Psychiatric: Negative; No behavioral problems, depression Sleep: He is using his BiPAP therapy with 100% compliance.  No snoring, daytime sleepiness, hypersomnolence, bruxism, restless legs, hypnogognic hallucinations, no cataplexy Other comprehensive 14 point system review is negative.  PE BP (!) 144/102   Pulse 85   Ht '5\' 11"'  (1.803 m)   Wt 254 lb (115.2 kg)   BMI 35.43 kg/m    Repeat blood pressure by me was 142/94  Wt Readings from Last 3 Encounters:  11/08/16 254 lb (115.2 kg)  10/25/16 249 lb (112.9 kg)  10/20/16 246 lb (111.6 kg)   General: Alert, oriented, no distress.  Skin: normal turgor, no rashes, warm and dry HEENT: Normocephalic, atraumatic. Pupils equal round and reactive to light; sclera anicteric; extraocular muscles intact;  Nose without nasal septal hypertrophy Mouth/Parynx benign; Mallinpatti scale 3 Neck: No JVD, no carotid bruits; normal carotid upstroke Lungs: clear to ausculatation and percussion; no wheezing or rales Chest wall: without tenderness to palpitation Heart: PMI not displaced, heart rate in the mid 80s, s1 s2 normal, 1/6 systolic murmur, no diastolic murmur, no rubs, gallops, thrills, or heaves Abdomen: Moderate adiposity; soft, nontender; no hepatosplenomehaly, BS+; abdominal aorta nontender and not dilated by palpation. Back: no CVA tenderness Pulses 2+ Musculoskeletal: full range of motion, normal strength, no joint deformities Extremities: no clubbing cyanosis or edema, Homan's sign negative  Neurologic: grossly nonfocal; Cranial nerves grossly wnl Psychologic: Normal mood and affect; normal cognitive function   ECG (independently read by me): Probable atrial flutter 3:1 block , ventricular rate at 85;  right bundle branch block with repolarization changes.  QTc interval 528 ms.    May 2018 ECG (independently read by me): atrial flutter with variable block with ventricular rate at 89 bpm.  Right bundle-branch block, left anterior hemiblock.  September 2017 ECG (independently read by me): Normal sinus rhythm at 63 bpm.  First-degree AV block with a PR interval of 208 ms.  Right bundle branch block with repolarization changes.  QTc interval 466  ms.  10/28/15 ECG (independently read by me): Sinus bradycardia at 54 bpm.  First degree block with PR interval 222 ms.  Right bundle-branch block.  05/17/2017ECG (independently read by me): Atrial flutter with variable block.  Right bundle-branch block with repolarization changes.  October 2016 ECG (independently read by me): sinus rhythm with first-degree AV block with a PR interval at 216 ms.  Ventricular rate 86 bpm with occasional PVCs.    May 2016ECG (independently read by me): Normal sinus rhythm at 60 bpm.  Right bundle-branch block with repolarization changes.  November 2015 ECG (independently read by me): Normal sinus rhythm at 61.  Nonspecific T abnormality.  QTc interval 394 ms.  11/18/2013 ECG (independently read by me): Sinus bradycardia 58 beats per minute.  PR interval 198 ms; QTc interval 371 ms.  Nonspecific ST changes.  07/29/2013 ECG  (independently read by me): Atrial flutter with 2:1 block with a ventricular rate of 87 beats per minute. Atrial rate is approximately 360 ms. 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 fib flutter now with right bundle branch block and repolarization changes.  Prior ECG of 06/04/2013: EKG  suggests probable atrial flutter with a ventricular rate of 105 beats per minute. There also are frequent PVCs. QTc interval is 436 ms. They're nonspecific T changes.  LABS: BMP Latest Ref Rng & Units 10/25/2016 05/17/2016 11/16/2015  Glucose 70 - 99 mg/dL 123(H) 100 85  BUN 7 - 25 mg/dL 24 25.8 22.6  Creatinine 0.70 - 1.18  mg/dL 2.08(H) 2.0(H) 1.7(H)  Sodium 135 - 146 mmol/L 141 143 143  Potassium 3.5 - 5.3 mmol/L 4.5 5.2(H) 4.8  Chloride 98 - 110 mmol/L 106 - -  CO2 20 - 31 mmol/L 22 27 30(H)  Calcium 8.6 - 10.3 mg/dL 9.3 9.6 9.5   Hepatic Function Latest Ref Rng & Units 10/25/2016 05/17/2016 11/16/2015  Total Protein 6.1 - 8.1 g/dL 6.8 7.3 7.0  Albumin 3.6 - 5.1 g/dL 4.3 3.4(L) 3.8  AST 10 - 35 U/L '19 22 17  ' ALT 9 - 46 U/L '25 26 24  ' Alk Phosphatase 40 - 115 U/L 39(L) 51 38(L)  Total Bilirubin 0.2 - 1.2 mg/dL 0.8 0.56 0.88  Bilirubin, Direct 0.0 - 0.3 mg/dL - - -   CBC Latest Ref Rng & Units 10/25/2016 09/07/2016 05/17/2016  WBC 3.8 - 10.8 K/uL 4.8 6.4 6.5  Hemoglobin 13.0 - 17.0 g/dL 14.4 14.9 14.2  Hematocrit 38.5 - 50.0 % 43.9 44.6 42.5  Platelets 140 - 400 K/uL 111(L) 127(L) 123(L)   Lab Results  Component Value Date   TSH 2.40 10/26/2015   Lipid Panel     Component Value Date/Time   CHOL 110 10/25/2016 0853   TRIG 185 (H) 10/25/2016 0853   HDL 32 (L) 10/25/2016 0853   CHOLHDL 3.4 10/25/2016 0853   VLDL 37 (H) 10/25/2016 0853   LDLCALC 41 10/25/2016 0853   LDLDIRECT 65.0 04/12/2013 0825     RADIOLOGY: No results found.  IMPRESSION:  1. Atrial flutter, unspecified type ([place])   2. OSA on CPAP   3. Chronic anticoagulation   4. Essential hypertension   5. Hyperlipidemia LDL goal <70   6. CKD (chronic kidney disease), stage III     ASSESSMENT AND PLAN: Mr. Baskette is a 76 year old Caucasian male who has documented normal coronary arteries by heart catheterization in 2008. He has a history of hypertension, mixed hyperlipidemia, and has paroxysmal atrial fibrillation/flutter.  He  had lost a significant amount of weight over the years and his weight had been fairly stable , but over the past year, he has regained the majority of this weight back.  Previously, with  weight loss he had developed slow pulses in the past leading to reduction of his medical regimen. He developed increasing  fatigability and shortness of breath .  In April 2018 l and was found to be back in atrial flutter.  He did not tolerate and attempted increase in propofenone and as a result his carvedilol was further titrated to 37.5 mg twice a day.  He continues to be in atrial flutter today with a ventricular rate in mid 80s.  He also is hypertensive.  He continues to be on propofenone 225 mg every 8 hours.  I have recommended he discontinue hydralazine and will now start Cardizem CD 180 mg to see if this can work with his antiarrhythmic therapy to hopefully convert him back to sinus rhythm.  I will also add low-dose hydrochlorothiazide, which will be helpful for blood pressure as well as potential ankle edema.  I will see him back in the office in 3 weeks for reevaluation.  If at that time he is still in atrial flutter.  I will schedule him for outpatient DC cardioversion.  He continues to be on eloquence and is tolerating this without bleeding for anticoagulation.  GERD is controlled on Prilosec.  He continues to use BiPAP therapy with 100% compliance.  He has stage III chronic kidney disease, and most recent creatinine was 2.08.  Time spent 25 minutes Shelva Majestic, MD  11/10/2016  7:01 PM

## 2016-11-08 NOTE — Patient Instructions (Addendum)
Medication Instructions:   STOP hydralazine  START cardizem CD 180mg  ONCE DAILY  START HCTZ 12.5mg  (1/2 tablet of the 25mg  strength).  After 4-5 days you may increase to a whole tablet if needed   Labwork:   none  Testing/Procedures:  none  Follow-Up:  In 3 weeks w Dr. Claiborne Billings    If you need a refill on your cardiac medications before your next appointment, please call your pharmacy.

## 2016-11-23 ENCOUNTER — Encounter: Payer: Self-pay | Admitting: Gastroenterology

## 2016-12-02 ENCOUNTER — Ambulatory Visit
Admission: RE | Admit: 2016-12-02 | Discharge: 2016-12-02 | Disposition: A | Discharge status: Home / Self Care | Payer: Medicare Other | Source: Ambulatory Visit | Attending: Cardiovascular Disease | Admitting: Cardiovascular Disease

## 2016-12-02 ENCOUNTER — Ambulatory Visit (INDEPENDENT_AMBULATORY_CARE_PROVIDER_SITE_OTHER): Payer: Medicare Other | Admitting: Cardiovascular Disease

## 2016-12-02 ENCOUNTER — Encounter: Payer: Self-pay | Admitting: Cardiovascular Disease

## 2016-12-02 VITALS — BP 140/84 | HR 83 | Ht 71.0 in | Wt 251.0 lb

## 2016-12-02 DIAGNOSIS — I10 Essential (primary) hypertension: Secondary | ICD-10-CM | POA: Diagnosis not present

## 2016-12-02 DIAGNOSIS — I4892 Unspecified atrial flutter: Secondary | ICD-10-CM

## 2016-12-02 DIAGNOSIS — G4733 Obstructive sleep apnea (adult) (pediatric): Secondary | ICD-10-CM | POA: Diagnosis not present

## 2016-12-02 DIAGNOSIS — D696 Thrombocytopenia, unspecified: Secondary | ICD-10-CM

## 2016-12-02 DIAGNOSIS — N183 Chronic kidney disease, stage 3 unspecified: Secondary | ICD-10-CM

## 2016-12-02 DIAGNOSIS — Z7901 Long term (current) use of anticoagulants: Secondary | ICD-10-CM

## 2016-12-02 DIAGNOSIS — Z01818 Encounter for other preprocedural examination: Secondary | ICD-10-CM | POA: Diagnosis not present

## 2016-12-02 DIAGNOSIS — Z9989 Dependence on other enabling machines and devices: Secondary | ICD-10-CM

## 2016-12-02 DIAGNOSIS — Z79899 Other long term (current) drug therapy: Secondary | ICD-10-CM | POA: Diagnosis not present

## 2016-12-02 MED ORDER — DILTIAZEM HCL ER COATED BEADS 240 MG PO CP24: 240.0000 mg | ORAL_CAPSULE | Freq: Every day | ORAL | 3 refills | Status: DC

## 2016-12-02 NOTE — Progress Notes (Signed)
Patient ID: Martin Cordova, male   DOB: 1941/03/13, 76 y.o.   MRN: [id number]      HPI: Martin Cordova is a 76 y.o. male is who presents to the office today for a 3 week follow-up cardiology evaluation.  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 In the past, he developed an obstructive uropathy attributed to kidney stones; creatinine had risen up to 3 and ultimately improved and stabilized at approximately 1.7. Martin Cordova uses his BiPAP daily and does note good sleep.  In the past he had issues with mild peripheral edema, which ultimately improved.  He had been previously taken off his lisinopril when his creatinine had risen in the setting of his obstructive uropathy.  In 2015 he developed recurrent episodes of palpitations and was found to have recurrent paroxysmal atrial fibrillation/flutter.  His medications were adjusted and  he was started on antiarrhythmic therapy with Rythmol to take in addition to his increasing doses of carvedilol.  He was started on Eliquis for anticoagulation.  He underwent successful cardioversion on 08/05/2013.  Since that time, he is unaware of any recurrent atrial fibrillation.  He has noticed more energy. He had been maintained on 50 mg twice a day of carvedilol in addition to Rythmol 225 mg every 8 hours, but due to slow pulse rates, these doses have ultimately been reduced.  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 He has been on CPAP therapy initially for approximately 8 years and has been on BiPAP auto SV for over 4 years. He admits to using his BiPAP with 100% compliance.  He obtained a new BiPAP machine in June.  He feels that his machine is significantly  improved from his prior one. When I last saw him I reviewed his  download of his new BiPAP auto SV machine indicates that 90% of the time is EPAP pressure was 17 and 90% of the time his pressure support was 5.8, giving an IPAP of 21.8 cm.  He is using it 100% of the time and is averaging 5 hours and 49 minutes on his most recent download from 12/07/2013 through 01/05/2014.  His average AHI was 7.5.  His device setting maximum BiPAP pressure is 25 cm in maximum EPAP pressure 21 cm.  His average central event index is 1.3, and average obstructive apneic index 0.2, with an average copy index of 6.0.  Average breath.  Rate is 18 breaths per minute with a minute ventilation of 12.1.  Average total body may 670 mL.  When I saw him on 10/21/15 he was unaware of any arrhythmia.  He was on carvedilol 12.5 mg twice a day, Rythmol 225 mg twice a day, hydralazine 25 mg twice a day and eliquis  5 mg twice a day.  He also has been taking omeprazole for GERD and atorvastatin 10 mg for hyperlipidemia. He was found to have recurrent atrial flutter with variable block.  At that time, I recommended that he increase his Rythmol and take 225 mg every 8 hours and further increased his carvedilol to 18.75 mg twice a day.  Laboratory had revealed a magnesium of 2.0.  He has stage III chronic kidney disease and  his BUN was 26, creatinine 1.71 with a GFR estimate of 39.  Potassium was 4.8.  TSH was normal at 2.4.  Insomnia.  One week later in follow-up he had reverted back to sinus rhythm..  When I saw him in September 2017, he was maintaining sinus rhythm.  Since then, he admits to weight gain.  In early April, he began to notice some increasing shortness of breath and elevated heart rate and fatigue.  He was seen by [name] on 09/07/2016 and was found to be back in atrial flutter with a ventricular rate at 90.  He has been on eloquence 5 g twice a day for anticoagulation and was on carvedilol 18.75 twice a day.  Carvedilol dose was  increased to 25 mg twice a day.  He was also told at that time to try increasing his Propofenone to 300 mg 3 times a day.  He took this for several days but did not tolerate the increased dose and reduced it back to its previous dose of 225 mg every 8 hours.  When I saw him on 10/20/2016  I increased carvedilol to 37.5 mg twice a day.  He has continued to use his BiPAP therapy for sleep apnea.    When I last saw him 3 weeks ago, he was still in atrial flutter with 3:1 block.  I discontinued hydralazine and started him on Cardizem CD 180 mg, both for blood pressure and potential antiarrhythmic therapy. He also was having ankle edema and I started him on low-dose hydrochlorothiazide.  His peripheral edema has significantly improved.  He has felt better.  He denies episodes of chest tightness.  He presents for reevaluation.    Past Medical History  Diagnosis Date  . GERD 12/07/2006  . HYPERGLYCEMIA 12/07/2006  . HYPERLIPIDEMIA 12/07/2006    mixed wth an atherogenic dyslipidemic pattern  . HYPERTENSION 12/07/2006  . OBESITY 11/10/2009  . THROMBOCYTOPENIA 12/07/2006  . TRANSAMINASES, SERUM, ELEVATED 12/07/2006  . SLEEP APNEA 10/22/2007    uses bipap  . 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 Claiborne Billings  . Diabetes mellitus without complication   . Renal insufficiency     Cr to 3 on ACE  . Nephrolithiasis   . Normal coronary arteries 04/06/2007    by cath EF 55%., last ECHO 10/13/10 EF>55% mild MR, last nuc 06/16/10 EF 57% low risk scan  . Pulmonary HTN     RV syst.  40-66mHg- moderate by echo  . Palpitations     tachy  . PAF (paroxysmal atrial fibrillation) 04/09/2013    Past Surgical History  Procedure Laterality Date  . Cholecystectomy    . Rotator cuff repair    . Cystoscopy/retrograde/ureteroscopy  08/19/2011    Procedure: CYSTOSCOPY/RETROGRADE/URETEROSCOPY;  Surgeon: MHanley Ben MD;  [facility]  Service: Urology;  Laterality: Right;  JJ STENT PLACEMENT     . Cardiac catheterization  04/06/2007    normal cors    Allergies  Allergen Reactions  . Lisinopril     Renal insufficiency    Current Outpatient Prescriptions:  .  atorvastatin (LIPITOR) 10 MG tablet, TAKE 1 TABLET BY MOUTH AT BEDTIME, Disp: 30 tablet, Rfl: 6 .  carvedilol (COREG) 25 MG tablet, Take 1.5 tablets (37.5 mg total) by mouth 2 (two) times daily with a meal., Disp: 90 tablet, Rfl: 6 .  ELIQUIS 5 MG TABS tablet, TAKE 1 TABLET BY MOUTH TWICE A DAY, Disp: 60 tablet, Rfl: 5 .  hydrochlorothiazide (HYDRODIURIL) 25  MG tablet, Take 0.5 tablets (12.5 mg total) by mouth daily. (Patient taking differently: Take 12.5 mg by mouth 2 (two) times daily. ), Disp: 30 tablet, Rfl: 5 .  Multiple Vitamins-Minerals (CENTRUM SILVER 50+MEN) TABS, Take by mouth., Disp: , Rfl:  .  NON FORMULARY, BIPAP therapy, Disp: , Rfl:  .  omega-3 acid ethyl esters (LOVAZA) 1 g capsule, TAKE 2 CAPSULES BY MOUTH EVERY DAY, Disp: 60 capsule, Rfl: 4 .  omeprazole (PRILOSEC) 20 MG capsule, TAKE 1 CAPSULE (20 MG TOTAL) BY MOUTH DAILY., Disp: 30 capsule, Rfl: 11 .  potassium citrate (UROCIT-K) 10 MEQ (1080 MG) SR tablet, Take 10 mEq by mouth 3 (three) times daily with meals. , Disp: , Rfl:  .  propafenone (RYTHMOL) 225 MG tablet, Take 225 mg by mouth every 8 (eight) hours., Disp: , Rfl:  .  sildenafil (VIAGRA) 100 MG tablet, Take 0.5-1 tablets (50-100 mg total) by mouth daily as needed for erectile dysfunction., Disp: 5 tablet, Rfl: 11 .  diltiazem (CARDIZEM CD) 240 MG 24 hr capsule, Take 1 capsule (240 mg total) by mouth daily., Disp: 90 capsule, Rfl: 3  Socially he is married has 4 children and 5 grandchildren. He is a distant relative to the "[place] brothers." He does try to walk and exercise. There is no tobacco use. He does drink occasional alcohol.   ROS General: Negative; No fevers, chills, or night sweats;  HEENT: Negative; No changes in vision or hearing, sinus congestion, difficulty swallowing Pulmonary:  Negative; No cough, wheezing, shortness of breath, hemoptysis Cardiovascular: See history of present illness No recent peripheral edema GI: Negative; No nausea, vomiting, diarrhea, or abdominal pain GU: Renal function has stabilized; No dysuria, hematuria, or difficulty voiding; some difficulty with erectile function Musculoskeletal: Negative; no myalgias, joint pain, or weakness Hematologic/Oncology: Negative; no easy bruising, bleeding Endocrine: Negative; no heat/cold intolerance; no diabetes Neuro: Negative; no changes in balance, headaches Skin: Negative; No rashes or skin lesions Psychiatric: Negative; No behavioral problems, depression Sleep: He is using his BiPAP therapy with 100% compliance.  No snoring, daytime sleepiness, hypersomnolence, bruxism, restless legs, hypnogognic hallucinations, no cataplexy Other comprehensive 14 point system review is negative.  PE BP 140/84   Pulse 83   Ht '5\' 11"'  (1.803 m)   Wt 251 lb (113.9 kg)   BMI 35.01 kg/m    Repeat blood pressure by me was 142/90  Wt Readings from Last 3 Encounters:  12/02/16 251 lb (113.9 kg)  11/08/16 254 lb (115.2 kg)  10/25/16 249 lb (112.9 kg)      General: Alert, oriented, no distress.  Skin: normal turgor, no rashes, warm and dry HEENT: Normocephalic, atraumatic. Pupils equal round and reactive to light; sclera anicteric; extraocular muscles intact;  Nose without nasal septal hypertrophy Mouth/Parynx benign; Mallinpatti scale 3 Neck: No JVD, no carotid bruits; normal carotid upstroke Lungs: clear to ausculatation and percussion; no wheezing or rales Chest wall: without tenderness to palpitation Heart: PMI not displaced, RRR, s1 s2 normal, 1/6 systolic murmur, no diastolic murmur, no rubs, gallops, thrills, or heaves Abdomen: soft, nontender; no hepatosplenomehaly, BS+; abdominal aorta nontender and not dilated by palpation. Back: no CVA tenderness Pulses 2+ Musculoskeletal: full range of motion,  normal strength, no joint deformities Extremities: Trivial if any residual ankle edema; no clubbing cyanosis, Homan's sign negative  Neurologic: grossly nonfocal; Cranial nerves grossly wnl Psychologic: Normal mood and affect   ECG (independently read by me): Probable atrial flutter with a ventricular rate at 82 bpm.  Right  bundle-branch block, left anterior hemiblock.  11/08/2016 ECG (independently read by me): Probable atrial flutter 3:1 block , ventricular rate at 85;  right bundle branch block with repolarization changes.  QTc interval 528 ms.   May 2018 ECG (independently read by me): atrial flutter with variable block with ventricular rate at 89 bpm.  Right bundle-branch block, left anterior hemiblock.  September 2017 ECG (independently read by me): Normal sinus rhythm at 63 bpm.  First-degree AV block with a PR interval of 208 ms.  Right bundle branch block with repolarization changes.  QTc interval 466 ms.  10/28/15 ECG (independently read by me): Sinus bradycardia at 54 bpm.  First degree block with PR interval 222 ms.  Right bundle-branch block.  05/17/2017ECG (independently read by me): Atrial flutter with variable block.  Right bundle-branch block with repolarization changes.  October 2016 ECG (independently read by me): sinus rhythm with first-degree AV block with a PR interval at 216 ms.  Ventricular rate 86 bpm with occasional PVCs.    May 2016ECG (independently read by me): Normal sinus rhythm at 60 bpm.  Right bundle-branch block with repolarization changes.  November 2015 ECG (independently read by me): Normal sinus rhythm at 61.  Nonspecific T abnormality.  QTc interval 394 ms.  11/18/2013 ECG (independently read by me): Sinus bradycardia 58 beats per minute.  PR interval 198 ms; QTc interval 371 ms.  Nonspecific ST changes.  07/29/2013 ECG  (independently read by me): Atrial flutter with 2:1 block with a ventricular rate of 87 beats per minute. Atrial rate is  approximately 360 ms. 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 fib flutter now with right bundle branch block and repolarization changes.  Prior ECG of 06/04/2013: EKG  suggests probable atrial flutter with a ventricular rate of 105 beats per minute. There also are frequent PVCs. QTc interval is 436 ms. They're nonspecific T changes.  LABS: BMP Latest Ref Rng & Units 10/25/2016 05/17/2016 11/16/2015  Glucose 70 - 99 mg/dL 123(H) 100 85  BUN 7 - 25 mg/dL 24 25.8 22.6  Creatinine 0.70 - 1.18 mg/dL 2.08(H) 2.0(H) 1.7(H)  Sodium 135 - 146 mmol/L 141 143 143  Potassium 3.5 - 5.3 mmol/L 4.5 5.2(H) 4.8  Chloride 98 - 110 mmol/L 106 - -  CO2 20 - 31 mmol/L 22 27 30(H)  Calcium 8.6 - 10.3 mg/dL 9.3 9.6 9.5   Hepatic Function Latest Ref Rng & Units 10/25/2016 05/17/2016 11/16/2015  Total Protein 6.1 - 8.1 g/dL 6.8 7.3 7.0  Albumin 3.6 - 5.1 g/dL 4.3 3.4(L) 3.8  AST 10 - 35 U/L '19 22 17  ' ALT 9 - 46 U/L '25 26 24  ' Alk Phosphatase 40 - 115 U/L 39(L) 51 38(L)  Total Bilirubin 0.2 - 1.2 mg/dL 0.8 0.56 0.88  Bilirubin, Direct 0.0 - 0.3 mg/dL - - -   CBC Latest Ref Rng & Units 10/25/2016 09/07/2016 05/17/2016  WBC 3.8 - 10.8 K/uL 4.8 6.4 6.5  Hemoglobin 13.0 - 17.0 g/dL 14.4 14.9 14.2  Hematocrit 38.5 - 50.0 % 43.9 44.6 42.5  Platelets 140 - 400 K/uL 111(L) 127(L) 123(L)   Lab Results  Component Value Date   TSH 2.40 10/26/2015   Lipid Panel     Component Value Date/Time   CHOL 110 10/25/2016 0853   TRIG 185 (H) 10/25/2016 0853   HDL 32 (L) 10/25/2016 0853   CHOLHDL 3.4 10/25/2016 0853   VLDL 37 (H) 10/25/2016 6962  LDLCALC 41 10/25/2016 0853   LDLDIRECT 65.0 04/12/2013 0825     RADIOLOGY: No results found.  IMPRESSION:  1. Atrial flutter, unspecified type ([place])   2. Essential hypertension   3. Preoperative testing   4. Medication management   5. OSA on CPAP   6. Chronic anticoagulation   7. CKD (chronic kidney  disease), stage III   8. Thrombocytopenia ([place])     ASSESSMENT AND PLAN: Martin Cordova is a 76 year old Caucasian male who has documented normal coronary arteries by heart catheterization in 2008. He has a history of hypertension, mixed hyperlipidemia, and has paroxysmal atrial fibrillation/flutter.  He had lost a significant amount of weight over the years and his weight had been fairly stable , but over the past year, he has regained the majority of this weight back.  Previously, with  weight loss he had developed slow pulses in the past leading to reduction of his medical regimen. He developed increasing fatigability and shortness of breath .  In April 2018 he was found to be back in atrial flutter.  He did not tolerate and attempted increase in propofenone and as a result his carvedilol was further titrated to 37.5 mg twice a day.  When I last saw him, I discontinued hydralazine and initiated Cardizem CD 180 mg.  His ventricular rate today is slightly reduced at 82 compared to his last ECG, but he continues to be in atrial flutter.  His blood pressure continues to be mildly elevated and I will further titrate Cardizem to 240 mg daily.  He continues to be on Eliquis for anticoagulation.  His peripheral edema has improved with HCTZ.  He continues to use BiPAP therapy with 100% compliance.  I reviewed his laboratory from one month ago.  He has mild thrombocytopenia which has been fairly stable. He has stage III chronic kidney disease with his last creatinine at 2.08.  I discussed repeat cardioversion.  We will plan to be to do this several weeks as an outpatient at [facility]. .  Laboratory will be obtained prior to that cardioversion with follow-up chest x-ray.  I discussed the cardioversion with he and his wife in detail and he agrees to pursue the planned treatment.  Time spent 25 minutes Shelva Majestic, MD  12/02/2016  9:44 AM

## 2016-12-02 NOTE — Patient Instructions (Addendum)
A chest x-ray takes a picture of the organs and structures inside the chest, including the heart, lungs, and blood vessels. This test can show several things, including, whether the heart is enlarges; whether fluid is building up in the lungs; and whether pacemaker / defibrillator leads are still in place.  Your physician has recommended that you have a Cardioversion (DCCV) on July 23rd or 24th. Electrical Cardioversion uses a jolt of electricity to your heart either through paddles or wired patches attached to your chest. This is a controlled, usually prescheduled, procedure. Defibrillation is done under light anesthesia in the hospital, and you usually go home the day of the procedure. This is done to get your heart back into a normal rhythm. You are not awake for the procedure. Please see the instruction sheet given to you today.  Your physician has recommended you make the following change in your medication:   1.) the diltiazem has been increased from 180 mg to 240 mg daily. A new prescription has been sent to your pharmacy to reflect this change.   Your physician recommends that you return for lab work within 7 days of your procedure.

## 2016-12-08 ENCOUNTER — Other Ambulatory Visit: Payer: Self-pay | Admitting: *Deleted

## 2016-12-08 DIAGNOSIS — Z01818 Encounter for other preprocedural examination: Secondary | ICD-10-CM

## 2016-12-12 ENCOUNTER — Ambulatory Visit: Payer: Medicare Other | Admitting: Cardiovascular Disease

## 2016-12-15 ENCOUNTER — Telehealth: Payer: Self-pay | Admitting: Cardiovascular Disease

## 2016-12-15 MED ORDER — HYDROCHLOROTHIAZIDE 25 MG PO TABS
12.5000 mg | ORAL_TABLET | Freq: Every day | ORAL | 5 refills | Status: DC
Start: 2016-12-15 — End: 2017-08-31

## 2016-12-15 NOTE — Telephone Encounter (Signed)
New Message  Pt c/o medication issue:  1. Name of Medication: hydrochlorothiazide   2. How are you currently taking this medication (dosage and times per day)? 25mg    3. Are you having a reaction (difficulty breathing--STAT)? no  4. What is your medication issue? Pt daughter call requesting to speak with RN about getting driections changed. Pt daughter states she would the prescription to read 1/2 tablet or 1 tablet. Please call back to discuss

## 2016-12-15 NOTE — Telephone Encounter (Signed)
Spoke with daughter she states that pt's last OV with Dr Claiborne Billings she states that he said that ok to take additional 1/2 tab of HCTZ PRN swelling. She is asking for the SIG to be changed to this because pt is taking the second 1/2 tab few times a week and insurance is not paying for the additional tab.  Ok to change to 1/2 tab daily, ok to take additional 1/2 tab PRN swelling? please advise

## 2016-12-15 NOTE — Telephone Encounter (Signed)
Okay to take additional HCTZ on a when necessary basis

## 2016-12-20 LAB — CBC
Hematocrit: 40.5 % (ref 37.5–51.0)
Hemoglobin: 13.7 g/dL (ref 13.0–17.7)
MCH: 30.9 pg (ref 26.6–33.0)
MCHC: 33.8 g/dL (ref 31.5–35.7)
MCV: 91 fL (ref 79–97)
Platelets: 111 10*3/uL — ABNORMAL LOW (ref 150–379)
RBC: 4.43 x10E6/uL (ref 4.14–5.80)
RDW: 13.6 % (ref 12.3–15.4)
WBC: 4.2 10*3/uL (ref 3.4–10.8)

## 2016-12-20 LAB — BASIC METABOLIC PANEL
BUN/Creatinine Ratio: 16 (ref 10–24)
BUN: 28 mg/dL — ABNORMAL HIGH (ref 8–27)
CO2: 18 mmol/L — ABNORMAL LOW (ref 20–29)
Calcium: 9.1 mg/dL (ref 8.6–10.2)
Chloride: 102 mmol/L (ref 96–106)
Creatinine, Ser: 1.75 mg/dL — ABNORMAL HIGH (ref 0.76–1.27)
GFR calc Af Amer: 43 mL/min/{1.73_m2} — ABNORMAL LOW (ref >59)
GFR calc non Af Amer: 38 mL/min/{1.73_m2} — ABNORMAL LOW (ref >59)
Glucose: 184 mg/dL — ABNORMAL HIGH (ref 65–99)
Potassium: 4.2 mmol/L (ref 3.5–5.2)
Sodium: 142 mmol/L (ref 134–144)

## 2016-12-20 LAB — APTT: aPTT: 27 s (ref 24–33)

## 2016-12-20 LAB — PROTIME-INR
INR: 1.1 (ref 0.8–1.2)
Prothrombin Time: 11.2 s (ref 9.1–12.0)

## 2016-12-26 ENCOUNTER — Encounter (HOSPITAL_COMMUNITY)
Admission: RE | Disposition: A | Discharge status: Home / Self Care | Payer: Self-pay | Source: Ambulatory Visit | Attending: Cardiovascular Disease

## 2016-12-26 ENCOUNTER — Ambulatory Visit (HOSPITAL_COMMUNITY): Payer: Medicare Other | Admitting: Anesthesiology

## 2016-12-26 ENCOUNTER — Ambulatory Visit (HOSPITAL_COMMUNITY)
Admission: RE | Admit: 2016-12-26 | Discharge: 2016-12-26 | Disposition: A | Discharge status: Home / Self Care | Payer: Medicare Other | Source: Ambulatory Visit | Attending: Cardiovascular Disease | Admitting: Cardiovascular Disease

## 2016-12-26 ENCOUNTER — Encounter (HOSPITAL_COMMUNITY): Payer: Self-pay | Admitting: *Deleted

## 2016-12-26 DIAGNOSIS — Z87891 Personal history of nicotine dependence: Secondary | ICD-10-CM | POA: Insufficient documentation

## 2016-12-26 DIAGNOSIS — D696 Thrombocytopenia, unspecified: Secondary | ICD-10-CM | POA: Insufficient documentation

## 2016-12-26 DIAGNOSIS — E782 Mixed hyperlipidemia: Secondary | ICD-10-CM | POA: Diagnosis not present

## 2016-12-26 DIAGNOSIS — Z79899 Other long term (current) drug therapy: Secondary | ICD-10-CM | POA: Diagnosis not present

## 2016-12-26 DIAGNOSIS — Z7901 Long term (current) use of anticoagulants: Secondary | ICD-10-CM | POA: Diagnosis not present

## 2016-12-26 DIAGNOSIS — G4733 Obstructive sleep apnea (adult) (pediatric): Secondary | ICD-10-CM | POA: Diagnosis not present

## 2016-12-26 DIAGNOSIS — I483 Typical atrial flutter: Secondary | ICD-10-CM | POA: Diagnosis not present

## 2016-12-26 DIAGNOSIS — E1122 Type 2 diabetes mellitus with diabetic chronic kidney disease: Secondary | ICD-10-CM | POA: Diagnosis not present

## 2016-12-26 DIAGNOSIS — I4892 Unspecified atrial flutter: Secondary | ICD-10-CM | POA: Insufficient documentation

## 2016-12-26 DIAGNOSIS — Z01818 Encounter for other preprocedural examination: Secondary | ICD-10-CM

## 2016-12-26 DIAGNOSIS — Z6834 Body mass index (BMI) 34.0-34.9, adult: Secondary | ICD-10-CM | POA: Insufficient documentation

## 2016-12-26 DIAGNOSIS — E669 Obesity, unspecified: Secondary | ICD-10-CM | POA: Diagnosis not present

## 2016-12-26 DIAGNOSIS — I129 Hypertensive chronic kidney disease with stage 1 through stage 4 chronic kidney disease, or unspecified chronic kidney disease: Secondary | ICD-10-CM | POA: Diagnosis not present

## 2016-12-26 DIAGNOSIS — I272 Pulmonary hypertension, unspecified: Secondary | ICD-10-CM | POA: Diagnosis not present

## 2016-12-26 DIAGNOSIS — K219 Gastro-esophageal reflux disease without esophagitis: Secondary | ICD-10-CM | POA: Insufficient documentation

## 2016-12-26 DIAGNOSIS — N183 Chronic kidney disease, stage 3 (moderate): Secondary | ICD-10-CM | POA: Diagnosis not present

## 2016-12-26 HISTORY — PX: CARDIOVERSION: SHX1299

## 2016-12-26 SURGERY — CARDIOVERSION
Anesthesia: General

## 2016-12-26 MED ORDER — HYDROCORTISONE 1 % EX CREA: 1.0000 "application " | TOPICAL_CREAM | Freq: Three times a day (TID) | CUTANEOUS | 0 refills | Status: DC | PRN

## 2016-12-26 MED ORDER — SODIUM CHLORIDE 0.9 % IV SOLN
INTRAVENOUS | Status: DC
  Administered 2016-12-26 (×2): via INTRAVENOUS

## 2016-12-26 MED ORDER — SODIUM CHLORIDE 0.9% FLUSH: 3.0000 mL | Freq: Two times a day (BID) | INTRAVENOUS | Status: DC

## 2016-12-26 MED ORDER — SODIUM CHLORIDE 0.9% FLUSH: 3.0000 mL | INTRAVENOUS | Status: DC | PRN

## 2016-12-26 MED ORDER — PROPOFOL 10 MG/ML IV BOLUS
INTRAVENOUS | Status: DC | PRN
  Administered 2016-12-26: 6 mg via INTRAVENOUS

## 2016-12-26 MED ORDER — SODIUM CHLORIDE 0.9 % IV SOLN: 250.0000 mL | INTRAVENOUS | Status: DC

## 2016-12-26 MED ORDER — LIDOCAINE HCL (CARDIAC) 20 MG/ML IV SOLN
INTRAVENOUS | Status: DC | PRN
  Administered 2016-12-26: 60 mg via INTRAVENOUS

## 2016-12-26 MED ORDER — HYDROCORTISONE 1 % EX CREA: 1.0000 "application " | TOPICAL_CREAM | Freq: Three times a day (TID) | CUTANEOUS | Status: DC | PRN

## 2016-12-26 NOTE — Transfer of Care (Signed)
Immediate Anesthesia Transfer of Care Note  Patient: Martin Cordova  Procedure(s) Performed: Procedure(s): CARDIOVERSION (N/A)  Patient Location: Endoscopy Unit  Anesthesia Type:General  Level of Consciousness: awake, alert  and oriented  Airway & Oxygen Therapy: Patient Spontanous Breathing and Patient connected to nasal cannula oxygen  Post-op Assessment: Report given to RN and Post -op Vital signs reviewed and stable  Post vital signs: Reviewed and stable  Last Vitals:  Vitals:   12/26/16 0800  BP: (!) 177/101  Pulse: 87  Resp: 18  Temp: 36.6 C    Last Pain:  Vitals:   12/26/16 0800  TempSrc: Oral         Complications: No apparent anesthesia complications

## 2016-12-26 NOTE — Anesthesia Postprocedure Evaluation (Signed)
Anesthesia Post Note  Patient: Martin Cordova  Procedure(s) Performed: Procedure(s) (LRB): CARDIOVERSION (N/A)     Patient location during evaluation: PACU Anesthesia Type: General Level of consciousness: awake and alert Pain management: pain level controlled Vital Signs Assessment: post-procedure vital signs reviewed and stable Respiratory status: spontaneous breathing, nonlabored ventilation, respiratory function stable and patient connected to nasal cannula oxygen Cardiovascular status: blood pressure returned to baseline and stable Postop Assessment: no signs of nausea or vomiting Anesthetic complications: no    Last Vitals:  Vitals:   12/26/16 0930 12/26/16 0945  BP: 129/66 (!) 142/80  Pulse: 61   Resp: 18 11  Temp: 36.7 C     Last Pain:  Vitals:   12/26/16 0930  TempSrc: Oral                 Effie Berkshire

## 2016-12-26 NOTE — H&P (View-Only) (Signed)
Patient ID: Martin Cordova, male   DOB: 03/06/1941, 76 y.o.   MRN: [id number]      HPI: Martin Cordova is a 76 y.o. male is who presents to the office today for a 3 week follow-up cardiology evaluation.  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 In the past, he developed an obstructive uropathy attributed to kidney stones; creatinine had risen up to 3 and ultimately improved and stabilized at approximately 1.7. Martin Cordova uses his Cordova daily and does note good sleep.  In the past he had issues with mild peripheral edema, which ultimately improved.  He had been previously taken off his lisinopril when his creatinine had risen in the setting of his obstructive uropathy.  In 2015 he developed recurrent episodes of palpitations and was found to have recurrent paroxysmal atrial fibrillation/flutter.  His medications were adjusted and  he was started on antiarrhythmic therapy with Rythmol to take in addition to his increasing doses of carvedilol.  He was started on Eliquis for anticoagulation.  He underwent successful cardioversion on 08/05/2013.  Since that time, he is unaware of any recurrent atrial fibrillation.  He has noticed more energy. He had been maintained on 50 mg twice a day of carvedilol in addition to Rythmol 225 mg every 8 hours, but due to slow pulse rates, these doses have ultimately been reduced.  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 He has been on CPAP therapy initially for approximately 8 years and has been on Cordova auto SV for over 4 years. He admits to using his Cordova with 100% compliance.  He obtained a new Cordova machine in June.  He feels that his machine is significantly  improved from his prior one. When I last saw him I reviewed his  download of his new Cordova auto SV machine indicates that 90% of the time is EPAP pressure was 17 and 90% of the time his pressure support was 5.8, giving an IPAP of 21.8 cm.  He is using it 100% of the time and is averaging 5 hours and 49 minutes on his most recent download from 12/07/2013 through 01/05/2014.  His average AHI was 7.5.  His device setting maximum Cordova pressure is 25 cm in maximum EPAP pressure 21 cm.  His average central event index is 1.3, and average obstructive apneic index 0.2, with an average copy index of 6.0.  Average breath.  Rate is 18 breaths per minute with a minute ventilation of 12.1.  Average total body may 670 mL.  When I saw him on 10/21/15 he was unaware of any arrhythmia.  He was on carvedilol 12.5 mg twice a day, Rythmol 225 mg twice a day, hydralazine 25 mg twice a day and eliquis  5 mg twice a day.  He also has been taking omeprazole for GERD and atorvastatin 10 mg for hyperlipidemia. He was found to have recurrent atrial flutter with variable block.  At that time, I recommended that he increase his Rythmol and take 225 mg every 8 hours and further increased his carvedilol to 18.75 mg twice a day.  Laboratory had revealed a magnesium of 2.0.  He has stage III chronic kidney disease and  his BUN was 26, creatinine 1.71 with a GFR estimate of 39.  Potassium was 4.8.  TSH was normal at 2.4.  Insomnia.  One week later in follow-up he had reverted back to sinus rhythm..  When I saw him in September 2017, he was maintaining sinus rhythm.  Since then, he admits to weight gain.  In early April, he began to notice some increasing shortness of breath and elevated heart rate and fatigue.  He was seen by [name] on 09/07/2016 and was found to be back in atrial flutter with a ventricular rate at 90.  He has been on eloquence 5 g twice a day for anticoagulation and was on carvedilol 18.75 twice a day.  Carvedilol dose was  increased to 25 mg twice a day.  He was also told at that time to try increasing his Propofenone to 300 mg 3 times a day.  He took this for several days but did not tolerate the increased dose and reduced it back to its previous dose of 225 mg every 8 hours.  When I saw him on 10/20/2016  I increased carvedilol to 37.5 mg twice a day.  He has continued to use his Cordova therapy for sleep apnea.    When I last saw him 3 weeks ago, he was still in atrial flutter with 3:1 block.  I discontinued hydralazine and started him on Cardizem CD 180 mg, both for blood pressure and potential antiarrhythmic therapy. He also was having ankle edema and I started him on low-dose hydrochlorothiazide.  His peripheral edema has significantly improved.  He has felt better.  He denies episodes of chest tightness.  He presents for reevaluation.    Past Medical History  Diagnosis Date  . GERD 12/07/2006  . HYPERGLYCEMIA 12/07/2006  . HYPERLIPIDEMIA 12/07/2006    mixed wth an atherogenic dyslipidemic pattern  . HYPERTENSION 12/07/2006  . OBESITY 11/10/2009  . THROMBOCYTOPENIA 12/07/2006  . TRANSAMINASES, SERUM, ELEVATED 12/07/2006  . SLEEP APNEA 10/22/2007    uses Cordova  . 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 Claiborne Billings  . Diabetes mellitus without complication   . Renal insufficiency     Cr to 3 on ACE  . Nephrolithiasis   . Normal coronary arteries 04/06/2007    by cath EF 55%., last ECHO 10/13/10 EF>55% mild MR, last nuc 06/16/10 EF 57% low risk scan  . Pulmonary HTN     RV syst.  40-69mHg- moderate by echo  . Palpitations     tachy  . PAF (paroxysmal atrial fibrillation) 04/09/2013    Past Surgical History  Procedure Laterality Date  . Cholecystectomy    . Rotator cuff repair    . Cystoscopy/retrograde/ureteroscopy  08/19/2011    Procedure: CYSTOSCOPY/RETROGRADE/URETEROSCOPY;  Surgeon: MHanley Ben MD;  [facility]  Service: Urology;  Laterality: Right;  JJ STENT PLACEMENT     . Cardiac catheterization  04/06/2007    normal cors    Allergies  Allergen Reactions  . Lisinopril     Renal insufficiency    Current Outpatient Prescriptions:  .  atorvastatin (LIPITOR) 10 MG tablet, TAKE 1 TABLET BY MOUTH AT BEDTIME, Disp: 30 tablet, Rfl: 6 .  carvedilol (COREG) 25 MG tablet, Take 1.5 tablets (37.5 mg total) by mouth 2 (two) times daily with a meal., Disp: 90 tablet, Rfl: 6 .  ELIQUIS 5 MG TABS tablet, TAKE 1 TABLET BY MOUTH TWICE A DAY, Disp: 60 tablet, Rfl: 5 .  hydrochlorothiazide (HYDRODIURIL) 25  MG tablet, Take 0.5 tablets (12.5 mg total) by mouth daily. (Patient taking differently: Take 12.5 mg by mouth 2 (two) times daily. ), Disp: 30 tablet, Rfl: 5 .  Multiple Vitamins-Minerals (CENTRUM SILVER 50+MEN) TABS, Take by mouth., Disp: , Rfl:  .  NON FORMULARY, Cordova therapy, Disp: , Rfl:  .  omega-3 acid ethyl esters (LOVAZA) 1 g capsule, TAKE 2 CAPSULES BY MOUTH EVERY DAY, Disp: 60 capsule, Rfl: 4 .  omeprazole (PRILOSEC) 20 MG capsule, TAKE 1 CAPSULE (20 MG TOTAL) BY MOUTH DAILY., Disp: 30 capsule, Rfl: 11 .  potassium citrate (UROCIT-K) 10 MEQ (1080 MG) SR tablet, Take 10 mEq by mouth 3 (three) times daily with meals. , Disp: , Rfl:  .  propafenone (RYTHMOL) 225 MG tablet, Take 225 mg by mouth every 8 (eight) hours., Disp: , Rfl:  .  sildenafil (VIAGRA) 100 MG tablet, Take 0.5-1 tablets (50-100 mg total) by mouth daily as needed for erectile dysfunction., Disp: 5 tablet, Rfl: 11 .  diltiazem (CARDIZEM CD) 240 MG 24 hr capsule, Take 1 capsule (240 mg total) by mouth daily., Disp: 90 capsule, Rfl: 3  Socially he is married has 4 children and 5 grandchildren. He is a distant relative to the "Riemenschneider brothers." He does try to walk and exercise. There is no tobacco use. He does drink occasional alcohol.   ROS General: Negative; No fevers, chills, or night sweats;  HEENT: Negative; No changes in vision or hearing, sinus congestion, difficulty swallowing Pulmonary:  Negative; No cough, wheezing, shortness of breath, hemoptysis Cardiovascular: See history of present illness No recent peripheral edema GI: Negative; No nausea, vomiting, diarrhea, or abdominal pain GU: Renal function has stabilized; No dysuria, hematuria, or difficulty voiding; some difficulty with erectile function Musculoskeletal: Negative; no myalgias, joint pain, or weakness Hematologic/Oncology: Negative; no easy bruising, bleeding Endocrine: Negative; no heat/cold intolerance; no diabetes Neuro: Negative; no changes in balance, headaches Skin: Negative; No rashes or skin lesions Psychiatric: Negative; No behavioral problems, depression Sleep: He is using his Cordova therapy with 100% compliance.  No snoring, daytime sleepiness, hypersomnolence, bruxism, restless legs, hypnogognic hallucinations, no cataplexy Other comprehensive 14 point system review is negative.  PE BP 140/84   Pulse 83   Ht '5\' 11"'  (1.803 m)   Wt 251 lb (113.9 kg)   BMI 35.01 kg/m    Repeat blood pressure by me was 142/90  Wt Readings from Last 3 Encounters:  12/02/16 251 lb (113.9 kg)  11/08/16 254 lb (115.2 kg)  10/25/16 249 lb (112.9 kg)      General: Alert, oriented, no distress.  Skin: normal turgor, no rashes, warm and dry HEENT: Normocephalic, atraumatic. Pupils equal round and reactive to light; sclera anicteric; extraocular muscles intact;  Nose without nasal septal hypertrophy Mouth/Parynx benign; Mallinpatti scale 3 Neck: No JVD, no carotid bruits; normal carotid upstroke Lungs: clear to ausculatation and percussion; no wheezing or rales Chest wall: without tenderness to palpitation Heart: PMI not displaced, RRR, s1 s2 normal, 1/6 systolic murmur, no diastolic murmur, no rubs, gallops, thrills, or heaves Abdomen: soft, nontender; no hepatosplenomehaly, BS+; abdominal aorta nontender and not dilated by palpation. Back: no CVA tenderness Pulses 2+ Musculoskeletal: full range of motion,  normal strength, no joint deformities Extremities: Trivial if any residual ankle edema; no clubbing cyanosis, Homan's sign negative  Neurologic: grossly nonfocal; Cranial nerves grossly wnl Psychologic: Normal mood and affect   ECG (independently read by me): Probable atrial flutter with a ventricular rate at 82 bpm.  Right  bundle-branch block, left anterior hemiblock.  11/08/2016 ECG (independently read by me): Probable atrial flutter 3:1 block , ventricular rate at 85;  right bundle branch block with repolarization changes.  QTc interval 528 ms.   May 2018 ECG (independently read by me): atrial flutter with variable block with ventricular rate at 89 bpm.  Right bundle-branch block, left anterior hemiblock.  September 2017 ECG (independently read by me): Normal sinus rhythm at 63 bpm.  First-degree AV block with a PR interval of 208 ms.  Right bundle branch block with repolarization changes.  QTc interval 466 ms.  10/28/15 ECG (independently read by me): Sinus bradycardia at 54 bpm.  First degree block with PR interval 222 ms.  Right bundle-branch block.  05/17/2017ECG (independently read by me): Atrial flutter with variable block.  Right bundle-branch block with repolarization changes.  October 2016 ECG (independently read by me): sinus rhythm with first-degree AV block with a PR interval at 216 ms.  Ventricular rate 86 bpm with occasional PVCs.    May 2016ECG (independently read by me): Normal sinus rhythm at 60 bpm.  Right bundle-branch block with repolarization changes.  November 2015 ECG (independently read by me): Normal sinus rhythm at 61.  Nonspecific T abnormality.  QTc interval 394 ms.  11/18/2013 ECG (independently read by me): Sinus bradycardia 58 beats per minute.  PR interval 198 ms; QTc interval 371 ms.  Nonspecific ST changes.  07/29/2013 ECG  (independently read by me): Atrial flutter with 2:1 block with a ventricular rate of 87 beats per minute. Atrial rate is  approximately 360 ms. 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 fib flutter now with right bundle branch block and repolarization changes.  Prior ECG of 06/04/2013: EKG  suggests probable atrial flutter with a ventricular rate of 105 beats per minute. There also are frequent PVCs. QTc interval is 436 ms. They're nonspecific T changes.  LABS: BMP Latest Ref Rng & Units 10/25/2016 05/17/2016 11/16/2015  Glucose 70 - 99 mg/dL 123(H) 100 85  BUN 7 - 25 mg/dL 24 25.8 22.6  Creatinine 0.70 - 1.18 mg/dL 2.08(H) 2.0(H) 1.7(H)  Sodium 135 - 146 mmol/L 141 143 143  Potassium 3.5 - 5.3 mmol/L 4.5 5.2(H) 4.8  Chloride 98 - 110 mmol/L 106 - -  CO2 20 - 31 mmol/L 22 27 30(H)  Calcium 8.6 - 10.3 mg/dL 9.3 9.6 9.5   Hepatic Function Latest Ref Rng & Units 10/25/2016 05/17/2016 11/16/2015  Total Protein 6.1 - 8.1 g/dL 6.8 7.3 7.0  Albumin 3.6 - 5.1 g/dL 4.3 3.4(L) 3.8  AST 10 - 35 U/L '19 22 17  ' ALT 9 - 46 U/L '25 26 24  ' Alk Phosphatase 40 - 115 U/L 39(L) 51 38(L)  Total Bilirubin 0.2 - 1.2 mg/dL 0.8 0.56 0.88  Bilirubin, Direct 0.0 - 0.3 mg/dL - - -   CBC Latest Ref Rng & Units 10/25/2016 09/07/2016 05/17/2016  WBC 3.8 - 10.8 K/uL 4.8 6.4 6.5  Hemoglobin 13.0 - 17.0 g/dL 14.4 14.9 14.2  Hematocrit 38.5 - 50.0 % 43.9 44.6 42.5  Platelets 140 - 400 K/uL 111(L) 127(L) 123(L)   Lab Results  Component Value Date   TSH 2.40 10/26/2015   Lipid Panel     Component Value Date/Time   CHOL 110 10/25/2016 0853   TRIG 185 (H) 10/25/2016 0853   HDL 32 (L) 10/25/2016 0853   CHOLHDL 3.4 10/25/2016 0853   VLDL 37 (H) 10/25/2016 0383  LDLCALC 41 10/25/2016 0853   LDLDIRECT 65.0 04/12/2013 0825     RADIOLOGY: No results found.  IMPRESSION:  1. Atrial flutter, unspecified type ([place])   2. Essential hypertension   3. Preoperative testing   4. Medication management   5. OSA on CPAP   6. Chronic anticoagulation   7. CKD (chronic kidney  disease), stage III   8. Thrombocytopenia ([place])     ASSESSMENT AND PLAN: Martin Cordova is a 76 year old Caucasian male who has documented normal coronary arteries by heart catheterization in 2008. He has a history of hypertension, mixed hyperlipidemia, and has paroxysmal atrial fibrillation/flutter.  He had lost a significant amount of weight over the years and his weight had been fairly stable , but over the past year, he has regained the majority of this weight back.  Previously, with  weight loss he had developed slow pulses in the past leading to reduction of his medical regimen. He developed increasing fatigability and shortness of breath .  In April 2018 he was found to be back in atrial flutter.  He did not tolerate and attempted increase in propofenone and as a result his carvedilol was further titrated to 37.5 mg twice a day.  When I last saw him, I discontinued hydralazine and initiated Cardizem CD 180 mg.  His ventricular rate today is slightly reduced at 82 compared to his last ECG, but he continues to be in atrial flutter.  His blood pressure continues to be mildly elevated and I will further titrate Cardizem to 240 mg daily.  He continues to be on Eliquis for anticoagulation.  His peripheral edema has improved with HCTZ.  He continues to use Cordova therapy with 100% compliance.  I reviewed his laboratory from one month ago.  He has mild thrombocytopenia which has been fairly stable. He has stage III chronic kidney disease with his last creatinine at 2.08.  I discussed repeat cardioversion.  We will plan to be to do this several weeks as an outpatient at [facility]. .  Laboratory will be obtained prior to that cardioversion with follow-up chest x-ray.  I discussed the cardioversion with he and his wife in detail and he agrees to pursue the planned treatment.  Time spent 25 minutes Shelva Majestic, MD  12/02/2016  9:44 AM

## 2016-12-26 NOTE — Anesthesia Procedure Notes (Signed)
Procedure Name: MAC Date/Time: 12/26/2016 9:23 AM Performed by: Teressa Lower Pre-anesthesia Checklist: Patient identified, Emergency Drugs available, Suction available, Patient being monitored and Timeout performed Patient Re-evaluated:Patient Re-evaluated prior to induction Oxygen Delivery Method: Nasal cannula

## 2016-12-26 NOTE — CV Procedure (Signed)
  CARDIOVERSION NOTE   Procedure: Electrical Cardioversion Indications:  Atrial Flutter  Procedure Details:  Consent: Risks of procedure as well as the alternatives and risks of each were explained to the (patient/caregiver).  Consent for procedure obtained.  Time Out: Verified patient identification, verified procedure, site/side was marked, verified correct patient position, special equipment/implants available, medications/allergies/relevent history reviewed, required imaging and test results available.  Performed  Patient placed on cardiac monitor, pulse oximetry, supplemental oxygen as necessary.  Sedation given: propofol 60 mg and lidocaine 60 mg Pacer pads placed anterior and posterior chest.  Cardioverted 1 time(s).  Cardioverted at 120J.  Evaluation: Findings: Post procedure EKG shows: NSR Complications: None Patient did tolerate procedure well.       Troy Sine, MD, [facility] 12/26/2016 9:19 AM

## 2016-12-26 NOTE — Discharge Instructions (Signed)
Electrical Cardioversion, Care After °This sheet gives you information about how to care for yourself after your procedure. Your health care provider may also give you more specific instructions. If you have problems or questions, contact your health care provider. °What can I expect after the procedure? °After the procedure, it is common to have: °· Some redness on the skin where the shocks were given. ° °Follow these instructions at home: °· Do not drive for 24 hours if you were given a medicine to help you relax (sedative). °· Take over-the-counter and prescription medicines only as told by your health care provider. °· Ask your health care provider how to check your pulse. Check it often. °· Rest for 48 hours after the procedure or as told by your health care provider. °· Avoid or limit your caffeine use as told by your health care provider. °Contact a health care provider if: °· You feel like your heart is beating too quickly or your pulse is not regular. °· You have a serious muscle cramp that does not go away. °Get help right away if: °· You have discomfort in your chest. °· You are dizzy or you feel faint. °· You have trouble breathing or you are short of breath. °· Your speech is slurred. °· You have trouble moving an arm or leg on one side of your body. °· Your fingers or toes turn cold or blue. °This information is not intended to replace advice given to you by your health care provider. Make sure you discuss any questions you have with your health care provider. °Document Released: 03/13/2013 Document Revised: 12/25/2015 Document Reviewed: 11/27/2015 °Elsevier Interactive Patient Education © 2018 [employer] ° °

## 2016-12-26 NOTE — Interval H&P Note (Signed)
History and Physical Interval Note:  12/26/2016 9:09 AM  Martin Cordova  has presented today for surgery, with the diagnosis of AFIB  The various methods of treatment have been discussed with the patient and family. After consideration of risks, benefits and other options for treatment, the patient has consented to  Procedure(s): CARDIOVERSION (N/A) as a surgical intervention .  The patient's history has been reviewed, patient examined, no change in status, stable for surgery.  I have reviewed the patient's chart and labs.  Questions were answered to the patient's satisfaction.     Shelva Majestic

## 2016-12-26 NOTE — Anesthesia Preprocedure Evaluation (Addendum)
Anesthesia Evaluation  Patient identified by MRN, date of birth, ID band Patient awake    Reviewed: Allergy & Precautions, NPO status , Patient's Chart, lab work & pertinent test results  Airway Mallampati: II  TM Distance: >3 FB Neck ROM: Full    Dental  (+) Dental Advisory Given   Pulmonary sleep apnea , former smoker,    breath sounds clear to auscultation       Cardiovascular hypertension, Pt. on home beta blockers and Pt. on medications negative cardio ROS  + dysrhythmias Atrial Fibrillation  Rhythm:Irregular Rate:Abnormal     Neuro/Psych negative neurological ROS  negative psych ROS   GI/Hepatic GERD  Medicated,(+) Hepatitis -  Endo/Other  negative endocrine ROSdiabetes  Renal/GU Renal disease     Musculoskeletal negative musculoskeletal ROS (+)   Abdominal   Peds  Hematology negative hematology ROS (+)   Anesthesia Other Findings Day of surgery medications reviewed with the patient.  - Pulm HTN  Reproductive/Obstetrics                            Lab Results  Component Value Date   INR 1.1 12/20/2016   INR 1.0 09/23/2008   INR 1.0 RATIO 04/03/2007   Cath: LVEF: 50 - 55%  Anesthesia Physical Anesthesia Plan  ASA: III  Anesthesia Plan: General   Post-op Pain Management:    Induction: Intravenous  PONV Risk Score and Plan: 1 and Propofol  Airway Management Planned: Mask  Additional Equipment:   Intra-op Plan:   Post-operative Plan:   Informed Consent: I have reviewed the patients History and Physical, chart, labs and discussed the procedure including the risks, benefits and alternatives for the proposed anesthesia with the patient or authorized representative who has indicated his/her understanding and acceptance.   Dental advisory given  Plan Discussed with: CRNA  Anesthesia Plan Comments:         Anesthesia Quick Evaluation

## 2016-12-27 ENCOUNTER — Telehealth: Payer: Self-pay | Admitting: Cardiovascular Disease

## 2016-12-27 NOTE — Telephone Encounter (Signed)
Spoke with pt scheduled follow up after cardioversion with Isaac Laud 01-11-17 and scheduled f/u in October for Dr Claiborne Billings

## 2016-12-27 NOTE — Telephone Encounter (Signed)
New message    Patient wants to know if he needs a follow up with Dr Claiborne Billings for Cardioversion yesterday, I offered pt appt with APP he refused.

## 2016-12-29 ENCOUNTER — Encounter (HOSPITAL_COMMUNITY): Payer: Self-pay | Admitting: Cardiovascular Disease

## 2017-01-04 ENCOUNTER — Other Ambulatory Visit: Payer: Self-pay | Admitting: Dermatology

## 2017-01-11 ENCOUNTER — Ambulatory Visit (INDEPENDENT_AMBULATORY_CARE_PROVIDER_SITE_OTHER): Payer: Medicare Other | Admitting: Physician Assistant

## 2017-01-11 ENCOUNTER — Encounter: Payer: Self-pay | Admitting: Physician Assistant

## 2017-01-11 VITALS — BP 140/82 | HR 71 | Ht 71.0 in | Wt 254.0 lb

## 2017-01-11 DIAGNOSIS — E785 Hyperlipidemia, unspecified: Secondary | ICD-10-CM | POA: Diagnosis not present

## 2017-01-11 DIAGNOSIS — I4892 Unspecified atrial flutter: Secondary | ICD-10-CM | POA: Diagnosis not present

## 2017-01-11 DIAGNOSIS — N183 Chronic kidney disease, stage 3 unspecified: Secondary | ICD-10-CM

## 2017-01-11 DIAGNOSIS — I10 Essential (primary) hypertension: Secondary | ICD-10-CM | POA: Diagnosis not present

## 2017-01-11 DIAGNOSIS — R600 Localized edema: Secondary | ICD-10-CM

## 2017-01-11 NOTE — Progress Notes (Signed)
Cardiology Office Note    Date:  01/12/2017   ID:  CONWAY FEDORA, DOB 01-Jul-1940, MRN [id number]  PCP:  Susy Frizzle, MD  Cardiologist:  Dr. Claiborne Billings  Chief Complaint  Patient presents with  . Follow-up    seen for Dr. Claiborne Billings  . Edema    A little in the evening.    History of Present Illness:  PAO HAFFEY is a 76 y.o. male with PMH of HLD, HTN, CKD stage III, non-alcoholic fatty liver disease, normal coronaries by cath in 2012, PAF and obstructive sleep apnea on BiPAP. In 2015 he developed recurrent palpitation found to have recurrent paroxysmal atrial fibrillation/atrial flutter. He was started on Rythmol and carvedilol. He was also started on eliquis. He underwent successful cardioversion on 08/05/2013. He did have a recurrence of atrial fibrillation in May 2017, he later spontaneously converted back to sinus rhythm. He reverted back to atrial fibrillation in April 2018. He did not tolerate attempted increase in peripheral pattern final, therefore his carvedilol was increased to 37.5. His hydralazine was discontinued and he was started on Cardizem CD 180 mg daily. He eventually underwent successful DC cardioversion on 12/26/2016 after a single shock with 120 J. He presents today for follow-up  Patient presents today for post cardioversion visit, unfortunately it appears he already went back into atrial flutter based on today's EKG. Otherwise he has been feeling well with only mild fatigue. It is not interfering with his lifestyle. I did discuss the EKG reading with DOD Dr. Ellyn Hack to confirm it is indeed atrial flutter. At this time, I do not plan for another cardioversion. We did discuss various options including rate control therapy, on antiarrhythmic medication, versus even atrial flutter ablation. Given lack of significant symptom, I'm in favor of rate control therapy at this time. He does have low mild lower extremity edema which is chronic. I plan to discontinue the Rythmol. If  needed we can consider Tikosyn at a later time. He can follow-up with Dr. Claiborne Billings in October. During the meantime, he will keep a heart rate diary and let us know if his heart rate increased to above 90 bpm at rest. As for his lower extremity edema, it is very mild, I would not recommend any adjustment or medication at this time. Continue conservative management.    Past Medical History:  Diagnosis Date  . Colon polyps   . Diabetes mellitus without complication ([place])   . Diverticulosis   . GERD 12/07/2006  . HYPERGLYCEMIA 12/07/2006  . HYPERLIPIDEMIA 12/07/2006   mixed wth an atherogenic dyslipidemic pattern  . HYPERTENSION 12/07/2006  . Nephrolithiasis   . Nonalcoholic fatty liver disease   . Normal coronary arteries 04/06/2007   by cath EF 55%., last ECHO 10/13/10 EF>55% mild MR, last nuc 06/16/10 EF 57% low risk scan  . OBESITY 11/10/2009  . PAF (paroxysmal atrial fibrillation) ([place]) 04/09/2013  . Palpitations    tachy  . Pulmonary HTN (HCC)    RV syst.  40-46mmHg- moderate by echo  . Renal insufficiency    Cr to 3 on ACE  . SLEEP APNEA 10/22/2007   uses bipap  . THROMBOCYTOPENIA 12/07/2006  . TRANSAMINASES, SERUM, ELEVATED 12/07/2006  . 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 Claiborne Billings    Past Surgical History:  Procedure Laterality Date  . CARDIAC CATHETERIZATION  04/06/2007   normal cors  . CARDIOVERSION N/A 08/05/2013   Procedure: CARDIOVERSION;  Surgeon: Troy Sine, MD;  Location: [facility];  Service: Cardiovascular;  Laterality: N/A;  . CARDIOVERSION N/A 12/26/2016   Procedure: CARDIOVERSION;  Surgeon: Troy Sine, MD;  Location: [facility];  Service: Cardiovascular;  Laterality: N/A;  . CHOLECYSTECTOMY    . CYSTOSCOPY/RETROGRADE/URETEROSCOPY  08/19/2011   Procedure: CYSTOSCOPY/RETROGRADE/URETEROSCOPY;  Surgeon: Hanley Ben, MD;  Location: [facility];  Service: Urology;  Laterality: Right;  JJ STENT PLACEMENT   . ROTATOR CUFF REPAIR Right      Current Medications: Outpatient Medications Prior to Visit  Medication Sig Dispense Refill  . atorvastatin (LIPITOR) 10 MG tablet TAKE 1 TABLET BY MOUTH AT BEDTIME 30 tablet 6  . carvedilol (COREG) 25 MG tablet Take 1.5 tablets (37.5 mg total) by mouth 2 (two) times daily with a meal. 90 tablet 6  . diltiazem (CARDIZEM CD) 240 MG 24 hr capsule Take 1 capsule (240 mg total) by mouth daily. 90 capsule 3  . ELIQUIS 5 MG TABS tablet TAKE 1 TABLET BY MOUTH TWICE A DAY 60 tablet 5  . hydrochlorothiazide (HYDRODIURIL) 25 MG tablet Take 0.5 tablets (12.5 mg total) by mouth daily. OK TO TAKE EXTRA 1/2 TAB PRN SWELLING 30 tablet 5  . Multiple Vitamins-Minerals (CENTRUM SILVER 50+MEN) TABS Take by mouth.    . NON FORMULARY BIPAP therapy    . omega-3 acid ethyl esters (LOVAZA) 1 g capsule TAKE 2 CAPSULES BY MOUTH EVERY DAY 60 capsule 4  . potassium citrate (UROCIT-K) 10 MEQ (1080 MG) SR tablet Take 10 mEq by mouth 3 (three) times daily with meals.     . sildenafil (VIAGRA) 100 MG tablet Take 0.5-1 tablets (50-100 mg total) by mouth daily as needed for erectile dysfunction. 5 tablet 11  . propafenone (RYTHMOL) 225 MG tablet Take 225 mg by mouth every 8 (eight) hours.    . hydrocortisone cream 1 % Apply 1 application topically 3 (three) times daily as needed for itching (skin irritation). 30 g 0  . omeprazole (PRILOSEC) 20 MG capsule TAKE 1 CAPSULE (20 MG TOTAL) BY MOUTH DAILY. 30 capsule 11   No facility-administered medications prior to visit.      Allergies:   Lisinopril   Social History   Social History  . Marital status: Married    Spouse name: N/A  . Number of children: 4  . Years of education: N/A   Occupational History  . retired Pharmacist, hospital    Social History Main Topics  . Smoking status: Former Smoker    Years: 10.00    Types: Cigarettes    Quit date: 08/04/1980  . Smokeless tobacco: Former Systems developer    Types: Chew    Quit date: 08/08/2004  . Alcohol use 0.0 oz/week     Comment: 1 beer  2-3 times per month  . Drug use: No  . Sexual activity: Yes   Other Topics Concern  . None   Social History Narrative  . None     Family History:  The patient's family history includes Cancer in his paternal uncle; Diabetes in his father and mother; Heart failure in his mother; Kidney failure in his father; Stroke in his mother.   ROS:   Please see the history of present illness.    ROS All other systems reviewed and are negative.   PHYSICAL EXAM:   VS:  BP 140/82   Pulse 71   Ht 5\' 11"  (1.803 m)   Wt 254 lb (115.2 kg)   BMI 35.43 kg/m    GEN: Well nourished, well developed, in no acute distress  HEENT: normal  Neck: no JVD, carotid bruits, or masses Cardiac: regularly irregular; no murmurs, rubs, or gallops. Trace edema  Respiratory:  clear to auscultation bilaterally, normal work of breathing GI: soft, nontender, nondistended, + BS MS: no deformity or atrophy  Skin: warm and dry, no rash Neuro:  Alert and Oriented x 3, Strength and sensation are intact Psych: euthymic mood, full affect  Wt Readings from Last 3 Encounters:  01/11/17 254 lb (115.2 kg)  12/26/16 248 lb (112.5 kg)  12/02/16 251 lb (113.9 kg)      Studies/Labs Reviewed:   EKG:  EKG is ordered today.  The ekg ordered today demonstrates Atrial flutter with variable ventricular rate, heart rate 71  Recent Labs: 10/25/2016: ALT 25 12/20/2016: BUN 28; Creatinine, Ser 1.75; Hemoglobin 13.7; Platelets 111; Potassium 4.2; Sodium 142   Lipid Panel    Component Value Date/Time   CHOL 110 10/25/2016 0853   TRIG 185 (H) 10/25/2016 0853   HDL 32 (L) 10/25/2016 0853   CHOLHDL 3.4 10/25/2016 0853   VLDL 37 (H) 10/25/2016 0853   LDLCALC 41 10/25/2016 0853   LDLDIRECT 65.0 04/12/2013 0825    Additional studies/ records that were reviewed today include:   Myoview 06/16/2010    Echo 10/13/2010    DCCV 12/26/2016  Cardioverted 1 time(s).  Cardioverted at 120J.  Evaluation: Findings: Post  procedure EKG shows: NSR Complications: None Patient did tolerate procedure well.   ASSESSMENT:    1. Atrial flutter, unspecified type ([place])   2. Essential hypertension   3. Hyperlipidemia, unspecified hyperlipidemia type   4. CKD (chronic kidney disease), stage III   5. Lower extremity edema      PLAN:  In order of problems listed above:  1. Recurrent atrial flutter: Unfortunately, he artery went back into atrial flutter after recent cardioversion. I have reviewed the EKG with DOD Dr. Ellyn Hack as well.  At some point may consider ablation procedure. He is currently very well rate controlled. I attempted to increase the Rythmol back in April without success, he has significant side effect from the medication with 300 mg 3 times a day dosing. Given the recent failure, we have decided to stop his Rythmol. He will contact us if his heart rate is more than 90 bpm at rest.  2. Lower extremity edema: I'm in favor of conservative management, very mild on physical exam.  3. Hyperlipidemia: Last lipid panel 10/25/2016, triglycerides and HDL or borderline LDL are well controlled. Encouraged diet and exercise  4. Hypertension: Blood pressure stable  5. CKD stage III: Renal function stable, baseline creatinine 1.7-2.0.    Medication Adjustments/Labs and Tests Ordered: Current medicines are reviewed at length with the patient today.  Concerns regarding medicines are outlined above.  Medication changes, Labs and Tests ordered today are listed in the Patient Instructions below. Patient Instructions  Medication Instructions:   STOP Rhythmol (Propafenone)  Continue to monitor your heart rate at home. If rate gets to 90 or above, please inform us.  Labwork:   none  Testing/Procedures:  none  Follow-Up:  As scheduled with Dr. Claiborne Billings  If you need a refill on your cardiac medications before your next appointment, please call your pharmacy.      Hilbert Corrigan, [place]  01/12/2017 9:27 AM     [address], [place], [place]  [address] Phone: [phone]; Fax: [phone]

## 2017-01-11 NOTE — Patient Instructions (Signed)
Medication Instructions:   STOP Rhythmol (Propafenone)  Continue to monitor your heart rate at home. If rate gets to 90 or above, please inform us.  Labwork:   none  Testing/Procedures:  none  Follow-Up:  As scheduled with Dr. Claiborne Billings  If you need a refill on your cardiac medications before your next appointment, please call your pharmacy.

## 2017-01-12 ENCOUNTER — Encounter: Payer: Self-pay | Admitting: Physician Assistant

## 2017-02-09 ENCOUNTER — Telehealth: Payer: Self-pay | Admitting: *Deleted

## 2017-02-09 ENCOUNTER — Encounter: Payer: Self-pay | Admitting: Gastroenterology

## 2017-02-09 ENCOUNTER — Ambulatory Visit (INDEPENDENT_AMBULATORY_CARE_PROVIDER_SITE_OTHER): Payer: Medicare Other | Admitting: Gastroenterology

## 2017-02-09 VITALS — BP 128/70 | HR 58 | Ht 71.0 in | Wt 254.2 lb

## 2017-02-09 DIAGNOSIS — Z7901 Long term (current) use of anticoagulants: Secondary | ICD-10-CM

## 2017-02-09 DIAGNOSIS — K760 Fatty (change of) liver, not elsewhere classified: Secondary | ICD-10-CM | POA: Diagnosis not present

## 2017-02-09 DIAGNOSIS — Z86010 Personal history of colonic polyps: Secondary | ICD-10-CM

## 2017-02-09 NOTE — Progress Notes (Signed)
HPI :  76 y/o male with a history of atrial fibrillation / atrial flutter on Eliquis, history of fatty liver and obesity, here to discuss surveillance colonoscopy. Last colonoscopy 01/23/14 showing 3 small adenomas. He has not been seen since that time.   Generally he feels well without complaints. No blood in the stools. No diarrhea or constipation. No abdominal pains. He had a cardioversion about a month ago but it did not take, he remains in AF. Cardiology considering ablative therapy. No chest pains or shortness of breath. No history of CVA or TIA.   No FH of colon cancer.   HIstory of fatty liver. [place] in June 2017 - fatty liver. Occasional alcohol, nothing routine.  Echocardiogram 2012 - EF > 55%  Colonoscopy - 01/23/2014 - 3 small polyps removed, TAs  Past Medical History:  Diagnosis Date  . Colon polyps   . Diabetes mellitus without complication ([place])   . Diverticulosis   . GERD 12/07/2006  . HYPERGLYCEMIA 12/07/2006  . HYPERLIPIDEMIA 12/07/2006   mixed wth an atherogenic dyslipidemic pattern  . HYPERTENSION 12/07/2006  . Nephrolithiasis   . Nonalcoholic fatty liver disease   . Normal coronary arteries 04/06/2007   by cath EF 55%., last ECHO 10/13/10 EF>55% mild MR, last nuc 06/16/10 EF 57% low risk scan  . OBESITY 11/10/2009  . PAF (paroxysmal atrial fibrillation) ([place]) 04/09/2013  . Palpitations    tachy  . Pulmonary HTN (HCC)    RV syst.  40-70mmHg- moderate by echo  . Renal insufficiency    Cr to 3 on ACE  . SLEEP APNEA 10/22/2007   uses bipap  . THROMBOCYTOPENIA 12/07/2006  . TRANSAMINASES, SERUM, ELEVATED 12/07/2006  . 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 Claiborne Billings     Past Surgical History:  Procedure Laterality Date  . CARDIAC CATHETERIZATION  04/06/2007   normal cors  . CARDIOVERSION N/A 08/05/2013   Procedure: CARDIOVERSION;  Surgeon: Troy Sine, MD;  Location: [facility];  Service: Cardiovascular;  Laterality: N/A;  . CARDIOVERSION N/A 12/26/2016   Procedure: CARDIOVERSION;  Surgeon: Troy Sine, MD;  Location: [facility]COPY;  Service: Cardiovascular;  Laterality: N/A;  . CHOLECYSTECTOMY    . CYSTOSCOPY/RETROGRADE/URETEROSCOPY  08/19/2011   Procedure: CYSTOSCOPY/RETROGRADE/URETEROSCOPY;  Surgeon: Hanley Ben, MD;  Location: [facility];  Service: Urology;  Laterality: Right;  JJ STENT PLACEMENT   . ROTATOR CUFF REPAIR Right    Family History  Problem Relation Age of Onset  . Diabetes Mother   . Heart failure Mother   . Stroke Mother   . Kidney failure Father   . Diabetes Father   . Cancer Paternal Uncle    Social History  Substance Use Topics  . Smoking status: Former Smoker    Years: 10.00    Types: Cigarettes    Quit date: 08/04/1980  . Smokeless tobacco: Former Systems developer    Types: Chew    Quit date: 08/08/2004  . Alcohol use 0.0 oz/week     Comment: 1 beer 2-3 times per month   Current Outpatient Prescriptions  Medication Sig Dispense Refill  . atorvastatin (LIPITOR) 10 MG tablet TAKE 1 TABLET BY MOUTH AT BEDTIME 30 tablet 6  . carvedilol (COREG) 25 MG tablet Take 1.5 tablets (37.5 mg total) by mouth 2 (two) times daily with a meal. 90 tablet 6  . diltiazem (CARDIZEM CD) 240 MG 24 hr capsule Take 1 capsule (240 mg total) by mouth daily. 90 capsule 3  . ELIQUIS 5 MG  TABS tablet TAKE 1 TABLET BY MOUTH TWICE A DAY 60 tablet 5  . hydrochlorothiazide (HYDRODIURIL) 25 MG tablet Take 0.5 tablets (12.5 mg total) by mouth daily. OK TO TAKE EXTRA 1/2 TAB PRN SWELLING 30 tablet 5  . Multiple Vitamins-Minerals (CENTRUM SILVER 50+MEN) TABS Take by mouth.    . NON FORMULARY BIPAP therapy    . omega-3 acid ethyl esters (LOVAZA) 1 g capsule TAKE 2 CAPSULES BY MOUTH EVERY DAY 60 capsule 4  . potassium citrate (UROCIT-K) 10 MEQ (1080 MG) SR tablet Take 10 mEq by mouth 3 (three) times daily with meals.     . sildenafil (VIAGRA) 100 MG tablet Take 0.5-1 tablets (50-100 mg total) by mouth daily as needed for erectile  dysfunction. 5 tablet 11   No current facility-administered medications for this visit.    Allergies  Allergen Reactions  . Lisinopril Swelling    Renal insufficiency     Review of Systems: All systems reviewed and negative except where noted in HPI.   Lab Results  Component Value Date   ALT 25 10/25/2016   AST 19 10/25/2016   ALKPHOS 39 (L) 10/25/2016   BILITOT 0.8 10/25/2016    Lab Results  Component Value Date   WBC 4.2 12/20/2016   HGB 13.7 12/20/2016   HCT 40.5 12/20/2016   MCV 91 12/20/2016   PLT 111 (L) 12/20/2016    Lab Results  Component Value Date   INR 1.1 12/20/2016   INR 1.0 09/23/2008   INR 1.0 RATIO 04/03/2007     Physical Exam: BP 128/70   Pulse (!) 58   Ht 5\' 11"  (1.803 m)   Wt 254 lb 4 oz (115.3 kg)   BMI 35.46 kg/m  Constitutional: Pleasant,well-developed, male in no acute distress. HEENT: Normocephalic and atraumatic. Conjunctivae are normal. No scleral icterus. Neck supple.  Cardiovascular: Normal rate, regular rhythm.  Pulmonary/chest: Effort normal and breath sounds normal. No wheezing, rales or rhonchi. Abdominal: Soft, nondistended, protuberant, nontender. There are no masses palpable. No hepatomegaly. Extremities: no edema Lymphadenopathy: No cervical adenopathy noted. Neurological: Alert and oriented to person place and time. Skin: Skin is warm and dry. No rashes noted. Psychiatric: Normal mood and affect. Behavior is normal.   ASSESSMENT AND PLAN: 76 year old male with history as above here for assessment of the following issues:  History of colon adenomas / anticoagulated for atrial fibrillation - he is due for surveillance colonoscopy at this time, he has no bowel symptoms currently. He is on Eliquis with a GFR in the 30s. We discussed risks and benefits of colonoscopy and anesthesia he wanted to proceed. It is recommend that he hold his Eliquis for 3 days prior to colonoscopy given his GFR to minimize bleeding risk. He can  take aspirin during this time. We will ask for approval from his cardiologist to do this. He prefers to schedule the exam in January when his wife is available to drive him. He will call in 1-2 months to schedule for January. All questions answered.   Fatty liver / obesity - noted to have fatty liver on prior imaging, his LFTs are normal. He has chronic thrombocytopenia, but no evidence of cirrhosis on imaging, and thrombocytopenia dates back several years. I discussed long-term risks of fatty liver to include the development of [place]/cirrhosis. We will recommend weight loss, which he is working on. He should have LFTs checked yearly for this issue.   Logan Cellar, MD [facility] Pager [phone]  CC: Susy Frizzle, MD

## 2017-02-09 NOTE — Patient Instructions (Signed)
It has been recommended to you by your physician that you have a(n) colonoscopy completed. Per your request, we did not schedule the procedure(s) today. Please contact our office at [phone] should you decide to have the procedure completed.  If you are age 74 or older, your body mass index should be between 23-30. Your Body mass index is 35.46 kg/m. If this is out of the aforementioned range listed, please consider follow up with your Primary Care Provider.  If you are age 21 or younger, your body mass index should be between 19-25. Your Body mass index is 35.46 kg/m. If this is out of the aformentioned range listed, please consider follow up with your Primary Care Provider.

## 2017-02-09 NOTE — Telephone Encounter (Signed)
   Martin Cordova Jan 12, 1941 [id number]  Dear [name]:  We have scheduled the above named patient for a(n) colonoscopy procedure. Our records show that (s)he is on anticoagulation therapy.  Please advise as to whether the patient may come off their therapy of Eliquis 3 days prior to their procedure which is to be scheduled.  Please route your response to [name], CMA.  Sincerely,  [facility]

## 2017-02-10 NOTE — Telephone Encounter (Signed)
Patient with diagnosis of atrial fibrillation on Eliquis for anticoagulation.    Procedure: colonoscopy Date of procedure: TBD  CHADS2 score of 2 (HTN, DM2);  CHADS2-VASc score of  3 (HTN, DM2, AGE)  CrCl 60 Platelet count 111  Per office protocol, patient can hold Eliquis for 3 days prior to procedure.    Patient should restart Eliquis on the evening of procedure or day after, at discretion of procedure MD

## 2017-02-10 NOTE — Telephone Encounter (Signed)
I have spoken to patient to advise that per his cardiology team, he has been okayed to hold Eliquis 3 days prior to procedure and restart evening of or day after procedure pending okay by Dr Havery Moros (Patient will be on aspirin during this time.) Patient verbalizes understanding and states that he would like to call back to schedule procedure in January 2019. Recall placed in EPIC to ensure that this does not get lost to follow up.

## 2017-02-13 NOTE — Telephone Encounter (Signed)
Thanks [employer]

## 2017-02-21 ENCOUNTER — Ambulatory Visit (INDEPENDENT_AMBULATORY_CARE_PROVIDER_SITE_OTHER): Payer: Medicare Other | Admitting: Family Medicine

## 2017-02-21 VITALS — BP 132/72 | HR 90 | Temp 98.2°F | Wt 250.0 lb

## 2017-02-21 DIAGNOSIS — K5792 Diverticulitis of intestine, part unspecified, without perforation or abscess without bleeding: Secondary | ICD-10-CM | POA: Diagnosis not present

## 2017-02-21 MED ORDER — HYDROCODONE-ACETAMINOPHEN 5-325 MG PO TABS: 1.0000 | ORAL_TABLET | Freq: Four times a day (QID) | ORAL | 0 refills | Status: DC | PRN

## 2017-02-21 MED ORDER — METRONIDAZOLE 500 MG PO TABS: 500.0000 mg | ORAL_TABLET | Freq: Two times a day (BID) | ORAL | 0 refills | Status: DC

## 2017-02-21 MED ORDER — CIPROFLOXACIN HCL 500 MG PO TABS: 500.0000 mg | ORAL_TABLET | Freq: Two times a day (BID) | ORAL | 0 refills | Status: DC

## 2017-02-21 NOTE — Progress Notes (Signed)
Subjective:    Patient ID: Martin Cordova, male    DOB: 1941-05-10, 76 y.o.   MRN: [id number]  HPI Over the last 48 hours, the patient is developed acute onset of left lower quadrant abdominal pain. Pain is made worse by movement. It is better when he is completely still. He has had a fever to 101. He is tender to palpation in the left lower quadrant. He admits that he has been constipated recently. He has had a history of diverticulitis in the past, and he states that the pain is similar. He is also experiencing nausea and vomiting 2 however the emesis was nonbilious. He is passing gas. He has hyperactive bowel sounds throughout on examination. He is tender to palpation. There is voluntary guarding. There is no rebound. There is no evidence of an acute abdomen at the present time. He denies any dysuria. He denies any hematuria. He denies any urgency or frequency. The pain does not radiate anywhere but remains stationary in the left lower quadrant. Past Medical History:  Diagnosis Date  . Colon polyps   . Diabetes mellitus without complication ([place])   . Diverticulosis   . GERD 12/07/2006  . HYPERGLYCEMIA 12/07/2006  . HYPERLIPIDEMIA 12/07/2006   mixed wth an atherogenic dyslipidemic pattern  . HYPERTENSION 12/07/2006  . Nephrolithiasis   . Nonalcoholic fatty liver disease   . Normal coronary arteries 04/06/2007   by cath EF 55%., last ECHO 10/13/10 EF>55% mild MR, last nuc 06/16/10 EF 57% low risk scan  . OBESITY 11/10/2009  . PAF (paroxysmal atrial fibrillation) ([place]) 04/09/2013  . Palpitations    tachy  . Pulmonary HTN (HCC)    RV syst.  40-13mmHg- moderate by echo  . Renal insufficiency    Cr to 3 on ACE  . SLEEP APNEA 10/22/2007   uses bipap  . THROMBOCYTOPENIA 12/07/2006  . TRANSAMINASES, SERUM, ELEVATED 12/07/2006  . 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 Claiborne Billings   Past Surgical History:  Procedure Laterality Date  . CARDIAC CATHETERIZATION  04/06/2007   normal cors  .  CARDIOVERSION N/A 08/05/2013   Procedure: CARDIOVERSION;  Surgeon: Troy Sine, MD;  Location: [facility];  Service: Cardiovascular;  Laterality: N/A;  . CARDIOVERSION N/A 12/26/2016   Procedure: CARDIOVERSION;  Surgeon: Troy Sine, MD;  Location: [facility] ENDOSCOPY;  Service: Cardiovascular;  Laterality: N/A;  . CHOLECYSTECTOMY    . CYSTOSCOPY/RETROGRADE/URETEROSCOPY  08/19/2011   Procedure: CYSTOSCOPY/RETROGRADE/URETEROSCOPY;  Surgeon: Hanley Ben, MD;  Location: [facility];  Service: Urology;  Laterality: Right;  JJ STENT PLACEMENT   . ROTATOR CUFF REPAIR Right    Current Outpatient Prescriptions on File Prior to Visit  Medication Sig Dispense Refill  . atorvastatin (LIPITOR) 10 MG tablet TAKE 1 TABLET BY MOUTH AT BEDTIME 30 tablet 6  . carvedilol (COREG) 25 MG tablet Take 1.5 tablets (37.5 mg total) by mouth 2 (two) times daily with a meal. 90 tablet 6  . diltiazem (CARDIZEM CD) 240 MG 24 hr capsule Take 1 capsule (240 mg total) by mouth daily. 90 capsule 3  . ELIQUIS 5 MG TABS tablet TAKE 1 TABLET BY MOUTH TWICE A DAY 60 tablet 5  . hydrochlorothiazide (HYDRODIURIL) 25 MG tablet Take 0.5 tablets (12.5 mg total) by mouth daily. OK TO TAKE EXTRA 1/2 TAB PRN SWELLING 30 tablet 5  . Multiple Vitamins-Minerals (CENTRUM SILVER 50+MEN) TABS Take by mouth.    . NON FORMULARY BIPAP therapy    . omega-3 acid ethyl esters (  LOVAZA) 1 g capsule TAKE 2 CAPSULES BY MOUTH EVERY DAY 60 capsule 4  . potassium citrate (UROCIT-K) 10 MEQ (1080 MG) SR tablet Take 10 mEq by mouth 3 (three) times daily with meals.     . sildenafil (VIAGRA) 100 MG tablet Take 0.5-1 tablets (50-100 mg total) by mouth daily as needed for erectile dysfunction. 5 tablet 11   No current facility-administered medications on file prior to visit.    Allergies  Allergen Reactions  . Lisinopril Swelling    Renal insufficiency   Social History   Social History  . Marital status: Married    Spouse name: N/A  .  Number of children: 4  . Years of education: N/A   Occupational History  . retired Pharmacist, hospital    Social History Main Topics  . Smoking status: Former Smoker    Years: 10.00    Types: Cigarettes    Quit date: 08/04/1980  . Smokeless tobacco: Former Systems developer    Types: Chew    Quit date: 08/08/2004  . Alcohol use 0.0 oz/week     Comment: 1 beer 2-3 times per month  . Drug use: No  . Sexual activity: Yes   Other Topics Concern  . Not on file   Social History Narrative  . No narrative on file       Review of Systems  All other systems reviewed and are negative.      Objective:   Physical Exam  Constitutional: He appears well-developed and well-nourished. No distress.  Neck: Neck supple.  Cardiovascular: Normal rate, regular rhythm and normal heart sounds.   Pulmonary/Chest: Effort normal and breath sounds normal. No respiratory distress. He has no wheezes. He has no rales.  Abdominal: Soft. He exhibits no distension and no mass. There is tenderness. There is guarding. There is no rebound.  Musculoskeletal: He exhibits no edema.  Lymphadenopathy:    He has no cervical adenopathy.  Skin: He is not diaphoretic.  Vitals reviewed.         Assessment & Plan:  Diverticulitis I believe the patient has diverticulitis. I want him to start Cipro 500 mg by mouth twice a day for 10 days and Flagyl 500 mg by mouth twice a day for 10 days immediately. He can use Linzess 145 mcg poqday as needed for constipation. I also gave him a prescription for Norco 5/325, one by mouth every 4 hours when necessary pain. However if the pain intensifies, the patient is to go to the emergency room for imaging of the abdomen and pelvis. We discussed the risk of perforation and abscess. At the present time I see no evidence of acute abdomen. He has voluntary guarding due to the pain but there is no evidence of rebound or peritonitis. If symptoms worsen, he agrees to go the emergency room. Otherwise recheck in 48  hours.

## 2017-02-22 ENCOUNTER — Inpatient Hospital Stay (HOSPITAL_COMMUNITY)
Admission: EM | Admit: 2017-02-22 | Discharge: 2017-02-24 | DRG: 872 | Disposition: A | Discharge status: Home / Self Care | Payer: Medicare Other | Attending: Internal Medicine | Admitting: Internal Medicine

## 2017-02-22 ENCOUNTER — Emergency Department (HOSPITAL_COMMUNITY): Payer: Medicare Other

## 2017-02-22 ENCOUNTER — Encounter (HOSPITAL_COMMUNITY): Payer: Self-pay

## 2017-02-22 DIAGNOSIS — K5792 Diverticulitis of intestine, part unspecified, without perforation or abscess without bleeding: Secondary | ICD-10-CM | POA: Diagnosis not present

## 2017-02-22 DIAGNOSIS — K5732 Diverticulitis of large intestine without perforation or abscess without bleeding: Secondary | ICD-10-CM | POA: Diagnosis present

## 2017-02-22 DIAGNOSIS — N529 Male erectile dysfunction, unspecified: Secondary | ICD-10-CM | POA: Diagnosis present

## 2017-02-22 DIAGNOSIS — G4730 Sleep apnea, unspecified: Secondary | ICD-10-CM | POA: Diagnosis not present

## 2017-02-22 DIAGNOSIS — Z8249 Family history of ischemic heart disease and other diseases of the circulatory system: Secondary | ICD-10-CM

## 2017-02-22 DIAGNOSIS — Z87891 Personal history of nicotine dependence: Secondary | ICD-10-CM

## 2017-02-22 DIAGNOSIS — E1122 Type 2 diabetes mellitus with diabetic chronic kidney disease: Secondary | ICD-10-CM | POA: Diagnosis present

## 2017-02-22 DIAGNOSIS — Z841 Family history of disorders of kidney and ureter: Secondary | ICD-10-CM

## 2017-02-22 DIAGNOSIS — D696 Thrombocytopenia, unspecified: Secondary | ICD-10-CM | POA: Diagnosis present

## 2017-02-22 DIAGNOSIS — I4892 Unspecified atrial flutter: Secondary | ICD-10-CM | POA: Diagnosis present

## 2017-02-22 DIAGNOSIS — Z888 Allergy status to other drugs, medicaments and biological substances status: Secondary | ICD-10-CM | POA: Diagnosis not present

## 2017-02-22 DIAGNOSIS — Z7901 Long term (current) use of anticoagulants: Secondary | ICD-10-CM

## 2017-02-22 DIAGNOSIS — E785 Hyperlipidemia, unspecified: Secondary | ICD-10-CM | POA: Diagnosis present

## 2017-02-22 DIAGNOSIS — K219 Gastro-esophageal reflux disease without esophagitis: Secondary | ICD-10-CM | POA: Diagnosis present

## 2017-02-22 DIAGNOSIS — R1032 Left lower quadrant pain: Secondary | ICD-10-CM | POA: Diagnosis present

## 2017-02-22 DIAGNOSIS — I472 Ventricular tachycardia: Secondary | ICD-10-CM | POA: Diagnosis present

## 2017-02-22 DIAGNOSIS — Z6835 Body mass index (BMI) 35.0-35.9, adult: Secondary | ICD-10-CM

## 2017-02-22 DIAGNOSIS — I480 Paroxysmal atrial fibrillation: Secondary | ICD-10-CM | POA: Diagnosis present

## 2017-02-22 DIAGNOSIS — Z87442 Personal history of urinary calculi: Secondary | ICD-10-CM

## 2017-02-22 DIAGNOSIS — E782 Mixed hyperlipidemia: Secondary | ICD-10-CM | POA: Diagnosis present

## 2017-02-22 DIAGNOSIS — I272 Pulmonary hypertension, unspecified: Secondary | ICD-10-CM | POA: Diagnosis present

## 2017-02-22 DIAGNOSIS — E119 Type 2 diabetes mellitus without complications: Secondary | ICD-10-CM

## 2017-02-22 DIAGNOSIS — N179 Acute kidney failure, unspecified: Secondary | ICD-10-CM | POA: Diagnosis present

## 2017-02-22 DIAGNOSIS — Z79899 Other long term (current) drug therapy: Secondary | ICD-10-CM

## 2017-02-22 DIAGNOSIS — I129 Hypertensive chronic kidney disease with stage 1 through stage 4 chronic kidney disease, or unspecified chronic kidney disease: Secondary | ICD-10-CM | POA: Diagnosis present

## 2017-02-22 DIAGNOSIS — A419 Sepsis, unspecified organism: Secondary | ICD-10-CM | POA: Diagnosis present

## 2017-02-22 DIAGNOSIS — Z86010 Personal history of colonic polyps: Secondary | ICD-10-CM

## 2017-02-22 DIAGNOSIS — K7581 Nonalcoholic steatohepatitis (NASH): Secondary | ICD-10-CM | POA: Diagnosis not present

## 2017-02-22 DIAGNOSIS — Z886 Allergy status to analgesic agent status: Secondary | ICD-10-CM

## 2017-02-22 DIAGNOSIS — Z833 Family history of diabetes mellitus: Secondary | ICD-10-CM

## 2017-02-22 DIAGNOSIS — I10 Essential (primary) hypertension: Secondary | ICD-10-CM | POA: Diagnosis present

## 2017-02-22 DIAGNOSIS — E860 Dehydration: Secondary | ICD-10-CM | POA: Diagnosis present

## 2017-02-22 DIAGNOSIS — N183 Chronic kidney disease, stage 3 (moderate): Secondary | ICD-10-CM | POA: Diagnosis present

## 2017-02-22 DIAGNOSIS — Z792 Long term (current) use of antibiotics: Secondary | ICD-10-CM

## 2017-02-22 DIAGNOSIS — E669 Obesity, unspecified: Secondary | ICD-10-CM | POA: Diagnosis present

## 2017-02-22 DIAGNOSIS — G4733 Obstructive sleep apnea (adult) (pediatric): Secondary | ICD-10-CM | POA: Diagnosis present

## 2017-02-22 DIAGNOSIS — Z823 Family history of stroke: Secondary | ICD-10-CM

## 2017-02-22 DIAGNOSIS — Z79891 Long term (current) use of opiate analgesic: Secondary | ICD-10-CM

## 2017-02-22 DIAGNOSIS — Z9049 Acquired absence of other specified parts of digestive tract: Secondary | ICD-10-CM | POA: Diagnosis not present

## 2017-02-22 HISTORY — DX: Diverticulitis of intestine, part unspecified, without perforation or abscess without bleeding: K57.92

## 2017-02-22 HISTORY — DX: Obstructive sleep apnea (adult) (pediatric): G47.33

## 2017-02-22 LAB — URINALYSIS, ROUTINE W REFLEX MICROSCOPIC
Bacteria, UA: NONE SEEN
Bilirubin Urine: NEGATIVE
Glucose, UA: NEGATIVE mg/dL
Hgb urine dipstick: NEGATIVE
Ketones, ur: NEGATIVE mg/dL
Leukocytes, UA: NEGATIVE
Nitrite: NEGATIVE
Protein, ur: 100 mg/dL — AB
Specific Gravity, Urine: 1.015 (ref 1.005–1.030)
Squamous Epithelial / LPF: NONE SEEN
pH: 5 (ref 5.0–8.0)

## 2017-02-22 LAB — COMPREHENSIVE METABOLIC PANEL
ALT: 42 U/L (ref 17–63)
AST: 35 U/L (ref 15–41)
Albumin: 3.4 g/dL — ABNORMAL LOW (ref 3.5–5.0)
Alkaline Phosphatase: 44 U/L (ref 38–126)
Anion gap: 14 (ref 5–15)
BUN: 33 mg/dL — ABNORMAL HIGH (ref 6–20)
CO2: 21 mmol/L — ABNORMAL LOW (ref 22–32)
Calcium: 8.9 mg/dL (ref 8.9–10.3)
Chloride: 99 mmol/L — ABNORMAL LOW (ref 101–111)
Creatinine, Ser: 2.36 mg/dL — ABNORMAL HIGH (ref 0.61–1.24)
GFR calc Af Amer: 30 mL/min — ABNORMAL LOW (ref >60)
GFR calc non Af Amer: 26 mL/min — ABNORMAL LOW (ref >60)
Glucose, Bld: 175 mg/dL — ABNORMAL HIGH (ref 65–99)
Potassium: 4 mmol/L (ref 3.5–5.1)
Sodium: 134 mmol/L — ABNORMAL LOW (ref 135–145)
Total Bilirubin: 2.2 mg/dL — ABNORMAL HIGH (ref 0.3–1.2)
Total Protein: 6.6 g/dL (ref 6.5–8.1)

## 2017-02-22 LAB — CBC WITH DIFFERENTIAL/PLATELET
Basophils Absolute: 0 10*3/uL (ref 0.0–0.1)
Basophils Relative: 0 %
Eosinophils Absolute: 0 10*3/uL (ref 0.0–0.7)
Eosinophils Relative: 0 %
HCT: 39.6 % (ref 39.0–52.0)
Hemoglobin: 13.1 g/dL (ref 13.0–17.0)
Lymphocytes Relative: 4 %
Lymphs Abs: 0.2 10*3/uL — ABNORMAL LOW (ref 0.7–4.0)
MCH: 29.5 pg (ref 26.0–34.0)
MCHC: 33.1 g/dL (ref 30.0–36.0)
MCV: 89.2 fL (ref 78.0–100.0)
Monocytes Absolute: 0.4 10*3/uL (ref 0.1–1.0)
Monocytes Relative: 7 %
Neutro Abs: 5 10*3/uL (ref 1.7–7.7)
Neutrophils Relative %: 89 %
Platelets: 63 10*3/uL — ABNORMAL LOW (ref 150–400)
RBC: 4.44 MIL/uL (ref 4.22–5.81)
RDW: 13.1 % (ref 11.5–15.5)
WBC: 5.6 10*3/uL (ref 4.0–10.5)

## 2017-02-22 LAB — CBG MONITORING, ED: Glucose-Capillary: 136 mg/dL — ABNORMAL HIGH (ref 65–99)

## 2017-02-22 LAB — I-STAT TROPONIN, ED: Troponin i, poc: 0.02 ng/mL (ref 0.00–0.08)

## 2017-02-22 LAB — I-STAT CG4 LACTIC ACID, ED: Lactic Acid, Venous: 1.5 mmol/L (ref 0.5–1.9)

## 2017-02-22 MED ORDER — ACETAMINOPHEN 650 MG RE SUPP: 650.0000 mg | Freq: Four times a day (QID) | RECTAL | Status: DC | PRN

## 2017-02-22 MED ORDER — ACETAMINOPHEN 325 MG PO TABS
650.0000 mg | ORAL_TABLET | Freq: Four times a day (QID) | ORAL | Status: DC | PRN
  Administered 2017-02-22: 650 mg via ORAL
  Filled 2017-02-22: qty 2

## 2017-02-22 MED ORDER — SODIUM CHLORIDE 0.9 % IV BOLUS (SEPSIS)
30.0000 mL/kg | Freq: Once | INTRAVENOUS | Status: AC
  Administered 2017-02-22: 3402 mL via INTRAVENOUS

## 2017-02-22 MED ORDER — PIPERACILLIN-TAZOBACTAM 3.375 G IVPB 30 MIN
3.3750 g | Freq: Once | INTRAVENOUS | Status: AC
  Administered 2017-02-22: 3.375 g via INTRAVENOUS
  Filled 2017-02-22: qty 50

## 2017-02-22 MED ORDER — ONDANSETRON HCL 4 MG/2ML IJ SOLN
4.0000 mg | Freq: Four times a day (QID) | INTRAMUSCULAR | Status: DC | PRN
  Administered 2017-02-22: 4 mg via INTRAVENOUS
  Filled 2017-02-22: qty 2

## 2017-02-22 MED ORDER — HEPARIN (PORCINE) IN NACL 100-0.45 UNIT/ML-% IJ SOLN
1300.0000 [IU]/h | INTRAMUSCULAR | Status: DC
  Administered 2017-02-22: 1400 [IU]/h via INTRAVENOUS
  Administered 2017-02-23 – 2017-02-24 (×2): 1300 [IU]/h via INTRAVENOUS
  Filled 2017-02-22 (×3): qty 250

## 2017-02-22 MED ORDER — PIPERACILLIN-TAZOBACTAM 3.375 G IVPB
3.3750 g | Freq: Three times a day (TID) | INTRAVENOUS | Status: DC
  Administered 2017-02-22 – 2017-02-24 (×6): 3.375 g via INTRAVENOUS
  Filled 2017-02-22 (×8): qty 50

## 2017-02-22 MED ORDER — ATORVASTATIN CALCIUM 10 MG PO TABS
10.0000 mg | ORAL_TABLET | Freq: Every day | ORAL | Status: DC
  Administered 2017-02-23: 10 mg via ORAL
  Filled 2017-02-22: qty 1

## 2017-02-22 MED ORDER — SODIUM CHLORIDE 0.9 % IV SOLN
Freq: Once | INTRAVENOUS | Status: AC
  Administered 2017-02-22: 19:00:00 via INTRAVENOUS

## 2017-02-22 MED ORDER — MORPHINE SULFATE (PF) 4 MG/ML IV SOLN
1.0000 mg | INTRAVENOUS | Status: DC | PRN
  Administered 2017-02-23 – 2017-02-24 (×3): 1 mg via INTRAVENOUS
  Filled 2017-02-22 (×3): qty 1

## 2017-02-22 MED ORDER — ONDANSETRON HCL 4 MG/2ML IJ SOLN
4.0000 mg | Freq: Once | INTRAMUSCULAR | Status: AC
  Administered 2017-02-22: 4 mg via INTRAVENOUS
  Filled 2017-02-22: qty 2

## 2017-02-22 MED ORDER — DILTIAZEM HCL ER COATED BEADS 240 MG PO CP24
240.0000 mg | ORAL_CAPSULE | Freq: Every day | ORAL | Status: DC
  Administered 2017-02-23 – 2017-02-24 (×2): 240 mg via ORAL
  Filled 2017-02-22 (×2): qty 1

## 2017-02-22 MED ORDER — ONDANSETRON HCL 4 MG PO TABS: 4.0000 mg | ORAL_TABLET | Freq: Four times a day (QID) | ORAL | Status: DC | PRN

## 2017-02-22 MED ORDER — SODIUM CHLORIDE 0.9 % IV SOLN
INTRAVENOUS | Status: AC
  Administered 2017-02-22: 20:00:00 via INTRAVENOUS
  Administered 2017-02-23: 1000 mL via INTRAVENOUS

## 2017-02-22 MED ORDER — CARVEDILOL 25 MG PO TABS
37.5000 mg | ORAL_TABLET | Freq: Two times a day (BID) | ORAL | Status: DC
  Administered 2017-02-23 – 2017-02-24 (×4): 37.5 mg via ORAL
  Filled 2017-02-22 (×4): qty 1

## 2017-02-22 MED ORDER — MORPHINE SULFATE (PF) 4 MG/ML IV SOLN
4.0000 mg | Freq: Once | INTRAVENOUS | Status: AC
  Administered 2017-02-22: 4 mg via INTRAVENOUS
  Filled 2017-02-22: qty 1

## 2017-02-22 MED ORDER — OMEGA-3-ACID ETHYL ESTERS 1 G PO CAPS
2.0000 | ORAL_CAPSULE | Freq: Every day | ORAL | Status: DC
  Administered 2017-02-23 – 2017-02-24 (×2): 2 g via ORAL
  Filled 2017-02-22 (×2): qty 2

## 2017-02-22 NOTE — ED Notes (Signed)
Attempted report x 2 

## 2017-02-22 NOTE — ED Provider Notes (Signed)
[facility] DEPT Provider Note   CSN: [id number] Arrival date & time: 02/22/17  1300     History   Chief Complaint Chief Complaint  Patient presents with  . Abdominal Pain    HPI Martin Cordova is a 76 y.o. male.  The history is provided by the patient. No language interpreter was used.   Martin Cordova is a 76 y.o. male who presents to the Emergency Department complaining of abdominal pain, near syncope.  He reports left lower quadrant abdominal pain, vomiting, diarrhea since last night. He also endorses subjective fever since last night. Today he began to feel lightheaded and as if he might pass out.  He denies any chest pain or shortness of breath. He was seen by his doctor yesterday and started on antibiotics for diverticulitis. He's been taking his Cipro and Flagyl. Past Medical History:  Diagnosis Date  . Colon polyps   . Diabetes mellitus without complication ([place])   . Diverticulitis   . Diverticulosis   . GERD 12/07/2006  . HYPERGLYCEMIA 12/07/2006  . HYPERLIPIDEMIA 12/07/2006   mixed wth an atherogenic dyslipidemic pattern  . HYPERTENSION 12/07/2006  . Nephrolithiasis   . Nonalcoholic fatty liver disease   . Normal coronary arteries 04/06/2007   by cath EF 55%., last ECHO 10/13/10 EF>55% mild MR, last nuc 06/16/10 EF 57% low risk scan  . OBESITY 11/10/2009  . PAF (paroxysmal atrial fibrillation) ([place]) 04/09/2013  . Palpitations    tachy  . Pulmonary HTN (HCC)    RV syst.  40-74mmHg- moderate by echo  . Renal insufficiency    Cr to 3 on ACE  . SLEEP APNEA 10/22/2007   uses bipap  . THROMBOCYTOPENIA 12/07/2006  . TRANSAMINASES, SERUM, ELEVATED 12/07/2006  . 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 Claiborne Billings    Patient Active Problem List   Diagnosis Date Noted  . Thrombocytopenia ([place]) 11/16/2015  . Anticoagulation adequate 10/23/2015  . Trigeminy 05/14/2014  . Hyperlipidemia LDL goal <70 04/07/2014  . Essential hypertension 04/07/2014  . Type 2 diabetes  mellitus ([place]) 12/02/2013  . Personal history of colonic polyps 11/08/2013  . 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/08/2013  . Atrial flutter ([place]) 07/29/2013  . PAF (paroxysmal atrial fibrillation) ([place]) 04/09/2013  . Edema 11/05/2012  . Pulmonary HTN ([place])   . Renal insufficiency 06/01/2011  . Obesity 11/10/2009  . Sleep apnea 10/22/2007  . Normal coronary arteries 04/06/2007  . VENTRICULAR TACHYCARDIA 03/27/2007  . HYPERLIPIDEMIA 12/07/2006  . THROMBOCYTOPENIA 12/07/2006  . HYPERTENSION 12/07/2006  . GERD 12/07/2006  . TRANSAMINASES, SERUM, ELEVATED 12/07/2006  . HYPERGLYCEMIA 12/07/2006    Past Surgical History:  Procedure Laterality Date  . CARDIAC CATHETERIZATION  04/06/2007   normal cors  . CARDIOVERSION N/A 08/05/2013   Procedure: CARDIOVERSION;  Surgeon: Troy Sine, MD;  Location: [facility];  Service: Cardiovascular;  Laterality: N/A;  . CARDIOVERSION N/A 12/26/2016   Procedure: CARDIOVERSION;  Surgeon: Troy Sine, MD;  Location: [facility] ENDOSCOPY;  Service: Cardiovascular;  Laterality: N/A;  . CHOLECYSTECTOMY    . CYSTOSCOPY/RETROGRADE/URETEROSCOPY  08/19/2011   Procedure: CYSTOSCOPY/RETROGRADE/URETEROSCOPY;  Surgeon: Hanley Ben, MD;  Location: [facility];  Service: Urology;  Laterality: Right;  JJ STENT PLACEMENT   . ROTATOR CUFF REPAIR Right        Home Medications    Prior to Admission medications   Medication Sig Start Date End Date Taking? Authorizing Provider  atorvastatin (LIPITOR) 10 MG tablet TAKE 1 TABLET BY MOUTH AT BEDTIME 09/06/16  Troy Sine, MD  carvedilol (COREG) 25 MG tablet Take 1.5 tablets (37.5 mg total) by mouth 2 (two) times daily with a meal. 10/20/16   Troy Sine, MD  ciprofloxacin (CIPRO) 500 MG tablet Take 1 tablet (500 mg total) by mouth 2 (two) times daily. 02/21/17   Susy Frizzle, MD  diltiazem (CARDIZEM CD) 240 MG 24 hr capsule Take 1 capsule (240 mg total) by mouth daily. 12/02/16 03/02/17  Troy Sine, MD  ELIQUIS 5 MG TABS tablet TAKE 1 TABLET BY MOUTH TWICE A DAY 05/24/16   Troy Sine, MD  hydrochlorothiazide (HYDRODIURIL) 25 MG tablet Take 0.5 tablets (12.5 mg total) by mouth daily. OK TO TAKE EXTRA 1/2 TAB PRN SWELLING 12/15/16 03/15/17  Troy Sine, MD  HYDROcodone-acetaminophen (NORCO) 5-325 MG tablet Take 1 tablet by mouth every 6 (six) hours as needed for moderate pain. 02/21/17   Susy Frizzle, MD  metroNIDAZOLE (FLAGYL) 500 MG tablet Take 1 tablet (500 mg total) by mouth 2 (two) times daily. 02/21/17   Susy Frizzle, MD  Multiple Vitamins-Minerals (CENTRUM SILVER 50+MEN) TABS Take by mouth.    [provider]  NON FORMULARY BIPAP therapy    [provider]  omega-3 acid ethyl esters (LOVAZA) 1 g capsule TAKE 2 CAPSULES BY MOUTH EVERY DAY 10/04/16   Troy Sine, MD  potassium citrate (UROCIT-K) 10 MEQ (1080 MG) SR tablet Take 10 mEq by mouth 3 (three) times daily with meals.  03/10/13   [provider]  sildenafil (VIAGRA) 100 MG tablet Take 0.5-1 tablets (50-100 mg total) by mouth daily as needed for erectile dysfunction. 04/28/15   Susy Frizzle, MD    Family History Family History  Problem Relation Age of Onset  . Diabetes Mother   . Heart failure Mother   . Stroke Mother   . Kidney failure Father   . Diabetes Father   . Cancer Paternal Uncle     Social History Social History  Substance Use Topics  . Smoking status: Former Smoker    Years: 10.00    Types: Cigarettes    Quit date: 08/04/1980  . Smokeless tobacco: Former Systems developer    Types: Chew    Quit date: 08/08/2004  . Alcohol use 0.0 oz/week     Comment: 1 beer 2-3 times per month     Allergies   Lisinopril   Review of Systems Review of Systems  All other systems reviewed and are negative.    Physical Exam Updated Vital Signs BP (!) 123/56   Pulse 66   Temp (!) 101.3 F (38.5 C) (Oral)   Resp 15   Ht 5\' 11"  (1.803 m)   Wt 115.2 kg (254 lb)   SpO2  94%   BMI 35.43 kg/m   Physical Exam  Constitutional: He is oriented to person, place, and time. He appears well-developed and well-nourished.  HENT:  Head: Normocephalic and atraumatic.  Cardiovascular: Normal rate and regular rhythm.   No murmur heard. Pulmonary/Chest: Effort normal and breath sounds normal. No respiratory distress.  Abdominal: Soft.  Moderate left lower quadrant tenderness with voluntary guarding  Musculoskeletal: He exhibits no edema or tenderness.  2+ DP pulses bilaterally  Neurological: He is alert and oriented to person, place, and time.  Skin: Skin is warm and dry.  Psychiatric: He has a normal mood and affect. His behavior is normal.  Nursing note and vitals reviewed.    ED Treatments / Results  Labs (  all labs ordered are listed, but only abnormal results are displayed) Labs Reviewed  COMPREHENSIVE METABOLIC PANEL - Abnormal; Notable for the following:       Result Value   Sodium 134 (*)    Chloride 99 (*)    CO2 21 (*)    Glucose, Bld 175 (*)    BUN 33 (*)    Creatinine, Ser 2.36 (*)    Albumin 3.4 (*)    Total Bilirubin 2.2 (*)    GFR calc non Af Amer 26 (*)    GFR calc Af Amer 30 (*)    All other components within normal limits  CBC WITH DIFFERENTIAL/PLATELET - Abnormal; Notable for the following:    Platelets 63 (*)    Lymphs Abs 0.2 (*)    All other components within normal limits  URINALYSIS, ROUTINE W REFLEX MICROSCOPIC - Abnormal; Notable for the following:    Protein, ur 100 (*)    All other components within normal limits  CULTURE, BLOOD (ROUTINE X 2)  CULTURE, BLOOD (ROUTINE X 2)  I-STAT CG4 LACTIC ACID, ED  I-STAT TROPONIN, ED    EKG  EKG Interpretation  Date/Time:  Wednesday February 22 2017 13:09:27 EDT Ventricular Rate:  80 PR Interval:    QRS Duration: 152 QT Interval:  404 QTC Calculation: 466 R Axis:   -71 Text Interpretation:  Sinus rhythm RBBB and LAFB Baseline wander in lead(s) V3 Confirmed by Quintella Reichert [phone]) on 02/22/2017 2:15:35 PM       Radiology Dg Chest Port 1 View  Result Date: 02/22/2017 CLINICAL DATA:  Fever and near syncope EXAM: PORTABLE CHEST 1 VIEW COMPARISON:  December 02, 2016 FINDINGS: There is no edema or consolidation. Heart is mildly enlarged with pulmonary vascularity within normal limits. No adenopathy. No evident bone lesions. IMPRESSION: Mild cardiac enlargement, stable.  No edema or consolidation. Electronically Signed   By: Lowella Grip III M.D.   On: 02/22/2017 13:47    Procedures Procedures (including critical care time)  Medications Ordered in ED Medications  sodium chloride 0.9 % bolus 3,402 mL (0 mL/kg  113.4 kg Intravenous Stopped 02/22/17 1600)  piperacillin-tazobactam (ZOSYN) IVPB 3.375 g (0 g Intravenous Stopped 02/22/17 1438)     Initial Impression / Assessment and Plan / ED Course  I have reviewed the triage vital signs and the nursing notes.  Pertinent labs & imaging results that were available during my care of the patient were reviewed by me and considered in my medical decision making (see chart for details).     Patient here for evaluation of fever, vomiting, diarrhea, abdominal pain, near syncopal episode today. Examination is consistent with diverticulitis, started on IV fluids and antibiotics given fever and near syncopal symptoms. Plan to obtain CT abdomen to rule out abscess or, kidding features to diverticulitis. Patient care transferred pending a CT abdomen.  Final Clinical Impressions(s) / ED Diagnoses   Final diagnoses:  None    New Prescriptions New Prescriptions   No medications on file     Quintella Reichert, MD 02/23/17 [phone]

## 2017-02-22 NOTE — ED Notes (Signed)
MD Rees at the bedside  

## 2017-02-22 NOTE — ED Triage Notes (Signed)
Per [facility], Pt is coming from home with complaints of N/V that started yesterday and worsened today. Pt reports being diagnosed with diverticulitis two days ago by PCP. Today, pt notice some dizziness along with fever.

## 2017-02-22 NOTE — Progress Notes (Signed)
ANTICOAGULATION CONSULT NOTE - Initial Consult  Pharmacy Consult for heparin Indication: atrial fibrillation  Allergies  Allergen Reactions  . Ibuprofen     Per the patient, he has "kidney and liver issues" and is NOT suppose to take this  . Lisinopril Swelling    Renal insufficiency also    Patient Measurements: Height: 5\' 11"  (180.3 cm) Weight: 254 lb (115.2 kg) IBW/kg (Calculated) : 75.3 Heparin Dosing Weight: 100kg  Vital Signs: Temp: 101.3 F (38.5 C) (09/19 1310) Temp Source: Oral (09/19 1310) BP: 160/62 (09/19 2145) Pulse Rate: 68 (09/19 2145)  Labs:  Recent Labs  02/22/17 1358  HGB 13.1  HCT 39.6  PLT 63*  CREATININE 2.36*    Estimated Creatinine Clearance: 35.5 mL/min (A) (by C-G formula based on SCr of 2.36 mg/dL (H)).   Medical History: Past Medical History:  Diagnosis Date  . Colon polyps   . Diabetes mellitus without complication ([place])   . Diverticulitis   . Diverticulosis   . GERD 12/07/2006  . HYPERGLYCEMIA 12/07/2006  . HYPERLIPIDEMIA 12/07/2006   mixed wth an atherogenic dyslipidemic pattern  . HYPERTENSION 12/07/2006  . Nephrolithiasis   . Nonalcoholic fatty liver disease   . Normal coronary arteries 04/06/2007   by cath EF 55%., last ECHO 10/13/10 EF>55% mild MR, last nuc 06/16/10 EF 57% low risk scan  . OBESITY 11/10/2009  . PAF (paroxysmal atrial fibrillation) ([place]) 04/09/2013  . Palpitations    tachy  . Pulmonary HTN (HCC)    RV syst.  40-49mmHg- moderate by echo  . Renal insufficiency    Cr to 3 on ACE  . SLEEP APNEA 10/22/2007   uses bipap  . THROMBOCYTOPENIA 12/07/2006  . TRANSAMINASES, SERUM, ELEVATED 12/07/2006  . 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 Claiborne Billings    Medications:  Infusions:  . sodium chloride 75 mL/hr at 02/22/17 2014  . heparin    . piperacillin-tazobactam (ZOSYN)  IV 3.375 g (02/22/17 2014)    Assessment: 61 yom with diverticulitis on chronic apixaban for history of afib. Last dose was this morning at  0830. To transition to IV heparin. Baseline H/H is WNL but platelets are low. Will need to monitor closely.   Goal of Therapy:  Heparin level 0.3-0.7 units/ml aPTT 66-102 seconds Monitor platelets by anticoagulation protocol: Yes   Plan:  Heparin gtt 1400 units/hr Check an 8 hr aPTT Daily aPTT, heparin level and CBC  Crystalann Korf, Rande Lawman 02/22/2017,10:22 PM

## 2017-02-22 NOTE — ED Notes (Signed)
Patient transported to CT 

## 2017-02-22 NOTE — ED Notes (Signed)
Patient given apple juice and salines per dr Johnney Killian

## 2017-02-22 NOTE — ED Provider Notes (Signed)
Patient accepted at signout from Dr. Ayesha Rumpf. Patient has a diagnosis of diverticulitis and was started on antibiotics day before yesterday. Patient has been worsening with increasing left lower quadrant pain and nausea and vomiting with chills. CT confirms diverticulitis without evidence of abscess or perforation at this time. Patient is reassessed and continues to have significant left lower quadrant pain requiring pain medication. Will plan for admission as patient has had vomiting and increasing pain with known diverticulitis.   Charlesetta Shanks, MD 02/22/17 1902

## 2017-02-22 NOTE — Progress Notes (Signed)
Pharmacy Antibiotic Note  Martin Cordova is a 76 y.o. male admitted on 02/22/2017 with intra-abdominal infxn.  Pharmacy has been consulted for zosyn dosing. Tmax is 101.3 and WBC is WNL. SCr is elevated at 2.36. Lactic is <2. Pt was recently started on cipro and flagyl.   Plan: Zosyn 3.375gm IV Q8H (4 hr inf) F/u renal fxn, C&S, clinical status  Height: 5\' 11"  (180.3 cm) Weight: 254 lb (115.2 kg) IBW/kg (Calculated) : 75.3  Temp (24hrs), Avg:101.3 F (38.5 C), Min:101.3 F (38.5 C), Max:101.3 F (38.5 C)   Recent Labs Lab 02/22/17 1358 02/22/17 1417  WBC 5.6  --   CREATININE 2.36*  --   LATICACIDVEN  --  1.50    Estimated Creatinine Clearance: 35.5 mL/min (A) (by C-G formula based on SCr of 2.36 mg/dL (H)).    Allergies  Allergen Reactions  . Ibuprofen     Per the patient, he has "kidney and liver issues" and is NOT suppose to take this  . Lisinopril Swelling    Renal insufficiency also    Antimicrobials this admission: Zosyn 9/19>>  Dose adjustments this admission: N/A  Microbiology results: Pending  Thank you for allowing pharmacy to be a part of this patient's care.  Analeese Andreatta, Rande Lawman 02/22/2017 8:07 PM

## 2017-02-22 NOTE — H&P (Signed)
History and Physical    Martin Cordova TDD:[id number] DOB: 23-Oct-1940 DOA: 02/22/2017  PCP: Susy Frizzle, MD  Patient coming from: Home.  Chief Complaint: Abdominal pain nausea vomiting.  HPI: Martin Cordova is a 76 y.o. male with history of severe obstructive sleep apnea on BiPAP, atrial flutter, hypertension, chronic kidney disease and morbid obesity presents to the ER with complaints of persistent abdominal pain with nausea vomiting. Patient's symptoms started 3 days ago with left lower quadrant abdominal pain subsequently developing nausea vomiting and fever and chills. Patient had taken antibiotics for the PCP as outpatient. Despite which patient was still having pain and worsening nausea vomiting. Denies any diarrhea.   ED Course: In the ER CT scan shows proximal sigmoid diverticulitis. Patient was febrile tachycardia in the ER. Patient was started on empiric antibiotics Zosyn and admitted for further management of acute diverticulitis.  Review of Systems: As per HPI, rest all negative.   Past Medical History:  Diagnosis Date  . Colon polyps   . Diabetes mellitus without complication ([place])   . Diverticulitis   . Diverticulosis   . GERD 12/07/2006  . HYPERGLYCEMIA 12/07/2006  . HYPERLIPIDEMIA 12/07/2006   mixed wth an atherogenic dyslipidemic pattern  . HYPERTENSION 12/07/2006  . Nephrolithiasis   . Nonalcoholic fatty liver disease   . Normal coronary arteries 04/06/2007   by cath EF 55%., last ECHO 10/13/10 EF>55% mild MR, last nuc 06/16/10 EF 57% low risk scan  . OBESITY 11/10/2009  . PAF (paroxysmal atrial fibrillation) ([place]) 04/09/2013  . Palpitations    tachy  . Pulmonary HTN (HCC)    RV syst.  40-51mmHg- moderate by echo  . Renal insufficiency    Cr to 3 on ACE  . SLEEP APNEA 10/22/2007   uses bipap  . THROMBOCYTOPENIA 12/07/2006  . TRANSAMINASES, SERUM, ELEVATED 12/07/2006  . 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 Claiborne Billings    Past Surgical History:    Procedure Laterality Date  . CARDIAC CATHETERIZATION  04/06/2007   normal cors  . CARDIOVERSION N/A 08/05/2013   Procedure: CARDIOVERSION;  Surgeon: Troy Sine, MD;  Location: [facility];  Service: Cardiovascular;  Laterality: N/A;  . CARDIOVERSION N/A 12/26/2016   Procedure: CARDIOVERSION;  Surgeon: Troy Sine, MD;  Location[facility] ENDOSCOPY;  Service: Cardiovascular;  Laterality: N/A;  . CHOLECYSTECTOMY    . CYSTOSCOPY/RETROGRADE/URETEROSCOPY  08/19/2011   Procedure: CYSTOSCOPY/RETROGRADE/URETEROSCOPY;  Surgeon: Hanley Ben, MD;  Location: [facility];  Service: Urology;  Laterality: Right;  JJ STENT PLACEMENT   . ROTATOR CUFF REPAIR Right      reports that he quit smoking about 36 years ago. His smoking use included Cigarettes. He quit after 10.00 years of use. He quit smokeless tobacco use about 12 years ago. His smokeless tobacco use included Chew. He reports that he drinks alcohol. He reports that he does not use drugs.  Allergies  Allergen Reactions  . Ibuprofen     Per the patient, he has "kidney and liver issues" and is NOT suppose to take this  . Lisinopril Swelling    Renal insufficiency also    Family History  Problem Relation Age of Onset  . Diabetes Mother   . Heart failure Mother   . Stroke Mother   . Kidney failure Father   . Diabetes Father   . Cancer Paternal Uncle     Prior to Admission medications   Medication Sig Start Date End Date Taking? Authorizing Provider  atorvastatin (LIPITOR)  10 MG tablet TAKE 1 TABLET BY MOUTH AT BEDTIME Patient taking differently: Take 10 mg by mouth at bedtime 09/06/16  Yes Troy Sine, MD  carvedilol (COREG) 25 MG tablet Take 1.5 tablets (37.5 mg total) by mouth 2 (two) times daily with a meal. 10/20/16  Yes Troy Sine, MD  ciprofloxacin (CIPRO) 500 MG tablet Take 1 tablet (500 mg total) by mouth 2 (two) times daily. 02/21/17  Yes Susy Frizzle, MD  diltiazem (CARDIZEM CD) 240 MG 24 hr capsule  Take 1 capsule (240 mg total) by mouth daily. 12/02/16 03/02/17 Yes Troy Sine, MD  ELIQUIS 5 MG TABS tablet TAKE 1 TABLET BY MOUTH TWICE A DAY Patient taking differently: Take 5 mg by mouth two times a day 05/24/16  Yes Troy Sine, MD  hydrochlorothiazide (HYDRODIURIL) 25 MG tablet Take 0.5 tablets (12.5 mg total) by mouth daily. OK TO TAKE EXTRA 1/2 TAB PRN SWELLING Patient taking differently: Take 12.5 mg by mouth daily. AND MAY TAKE AN ADDITIONAL 12.5 MG ONCE A DAY AS NEEDED FOR SWELLING 12/15/16 03/15/17 Yes Troy Sine, MD  HYDROcodone-acetaminophen (NORCO) 5-325 MG tablet Take 1 tablet by mouth every 6 (six) hours as needed for moderate pain. 02/21/17  Yes Susy Frizzle, MD  metroNIDAZOLE (FLAGYL) 500 MG tablet Take 1 tablet (500 mg total) by mouth 2 (two) times daily. 02/21/17  Yes Susy Frizzle, MD  Multiple Vitamins-Minerals (CENTRUM SILVER 50+MEN) TABS Take 1 tablet by mouth daily.    Yes [provider]  NON FORMULARY BIPAP at bedtime   Yes [provider]  omega-3 acid ethyl esters (LOVAZA) 1 g capsule TAKE 2 CAPSULES BY MOUTH EVERY DAY Patient taking differently: Take 1 gram by mouth two times a day 10/04/16  Yes Troy Sine, MD  potassium citrate (UROCIT-K) 10 MEQ (1080 MG) SR tablet Take 10 mEq by mouth 2 (two) times daily.  03/10/13  Yes [provider]  sildenafil (VIAGRA) 100 MG tablet Take 0.5-1 tablets (50-100 mg total) by mouth daily as needed for erectile dysfunction. 04/28/15  Yes Susy Frizzle, MD    Physical Exam: Vitals:   02/22/17 1615 02/22/17 1715 02/22/17 1730 02/22/17 1930  BP: (!) 123/56   132/66  Pulse: 66 65 65 82  Resp: 15 18 18  (!) 21  Temp:      TempSrc:      SpO2:  97% 94% 97%  Weight:      Height:          Constitutional: Moderately developed and nourished. Vitals:   02/22/17 1615 02/22/17 1715 02/22/17 1730 02/22/17 1930  BP: (!) 123/56   132/66  Pulse: 66 65 65 82  Resp: 15 18 18  (!) 21    Temp:      TempSrc:      SpO2:  97% 94% 97%  Weight:      Height:       Eyes: Anicteric no pallor. ENMT: No discharge from the ears eyes nose or mouth. Neck: No mass felt. No neck rigidity. No JVD appreciated. Respiratory: No rhonchi or crepitations. Cardiovascular: S1-S2 heard no murmurs appreciated. Abdomen: Tenderness in the left lower quadrant. No guarding or rigidity. Musculoskeletal: No edema. No joint effusion. Skin: No rash. Skin appears warm. Neurologic: Alert awake oriented to time place and person. Moves all extremities. Psychiatric: Appears normal. Normal affect.   Labs on Admission: I have personally reviewed following labs and imaging studies  CBC:  Recent Labs Lab  02/22/17 1358  WBC 5.6  NEUTROABS 5.0  HGB 13.1  HCT 39.6  MCV 89.2  PLT 63*   Basic Metabolic Panel:  Recent Labs Lab 02/22/17 1358  NA 134*  K 4.0  CL 99*  CO2 21*  GLUCOSE 175*  BUN 33*  CREATININE 2.36*  CALCIUM 8.9   GFR: Estimated Creatinine Clearance: 35.5 mL/min (A) (by C-G formula based on SCr of 2.36 mg/dL (H)). Liver Function Tests:  Recent Labs Lab 02/22/17 1358  AST 35  ALT 42  ALKPHOS 44  BILITOT 2.2*  PROT 6.6  ALBUMIN 3.4*   No results for input(s): LIPASE, AMYLASE in the last 168 hours. No results for input(s): AMMONIA in the last 168 hours. Coagulation Profile: No results for input(s): INR, PROTIME in the last 168 hours. Cardiac Enzymes: No results for input(s): CKTOTAL, CKMB, CKMBINDEX, TROPONINI in the last 168 hours. BNP (last 3 results) No results for input(s): PROBNP in the last 8760 hours. HbA1C: No results for input(s): HGBA1C in the last 72 hours. CBG: No results for input(s): GLUCAP in the last 168 hours. Lipid Profile: No results for input(s): CHOL, HDL, LDLCALC, TRIG, CHOLHDL, LDLDIRECT in the last 72 hours. Thyroid Function Tests: No results for input(s): TSH, T4TOTAL, FREET4, T3FREE, THYROIDAB in the last 72 hours. Anemia Panel: No  results for input(s): VITAMINB12, FOLATE, FERRITIN, TIBC, IRON, RETICCTPCT in the last 72 hours. Urine analysis:    Component Value Date/Time   COLORURINE YELLOW 02/22/2017 1421   APPEARANCEUR CLEAR 02/22/2017 1421   LABSPEC 1.015 02/22/2017 1421   PHURINE 5.0 02/22/2017 1421   GLUCOSEU NEGATIVE 02/22/2017 1421   GLUCOSEU NEGATIVE 04/03/2007 0746   HGBUR NEGATIVE 02/22/2017 1421   BILIRUBINUR NEGATIVE 02/22/2017 1421   BILIRUBINUR neg 07/26/2011 1009   KETONESUR NEGATIVE 02/22/2017 1421   PROTEINUR 100 (A) 02/22/2017 1421   UROBILINOGEN 0.2 07/26/2011 1009   UROBILINOGEN 0.2 09/25/2008 1945   NITRITE NEGATIVE 02/22/2017 1421   LEUKOCYTESUR NEGATIVE 02/22/2017 1421   Sepsis Labs: @LABRCNTIP (procalcitonin:4,lacticidven:4) )No results found for this or any previous visit (from the past 240 hour(s)).   Radiological Exams on Admission: Ct Abdomen Pelvis Wo Contrast  Result Date: 02/22/2017 CLINICAL DATA:  Abdominal pain, fever, evaluate for diverticulitis EXAM: CT ABDOMEN AND PELVIS WITHOUT CONTRAST TECHNIQUE: Multidetector CT imaging of the abdomen and pelvis was performed following the standard protocol without IV contrast. COMPARISON:  CT abdomen/pelvis dated 08/15/2011 FINDINGS: Lower chest: Mild patchy right lower lobe opacity, likely atelectasis. Hepatobiliary: Mild hepatic steatosis. Status post cholecystectomy. No intrahepatic extrahepatic ductal dilatation. Pancreas: Within normal limits. Spleen: Within normal limits. Adrenals/Urinary Tract: Adrenal glands within normal limits. Multiple bilateral renal cysts of varying sizes and complexities, measuring up to 8.6 cm in the left lower kidney (series 3/image 85), reflecting autosomal dominant polycystic kidney disease. No renal calculi or hydronephrosis. Bladder is within normal limits. Stomach/Bowel: Stomach is notable for a small hiatal hernia. No evidence of bowel obstruction. Normal appendix (series 3/ image 66). Left colonic  diverticulosis, with associated inflammatory changes in the left lower quadrant (series 3/image 32), suggesting proximal sigmoid diverticulitis. No drainable fluid collection/ abscess. Trace pericolonic fluid. No free air to suggest macroscopic perforation. Vascular/Lymphatic: No evidence of abdominal aortic aneurysm. Atherosclerotic calcifications of the abdominal aorta and branch vessels. No suspicious abdominopelvic lymphadenopathy. Reproductive: Prostate is unremarkable. Other: No abdominopelvic ascites. Tiny fat containing right inguinal hernia. Mild fat within the left inguinal canal. Musculoskeletal: Degenerative changes of the visualized thoracolumbar spine. IMPRESSION: Proximal sigmoid diverticulitis. No drainable fluid  collection/abscess. No free air. Additional stable ancillary findings as above. Electronically Signed   By: Julian Hy M.D.   On: 02/22/2017 17:37   Dg Chest Port 1 View  Result Date: 02/22/2017 CLINICAL DATA:  Fever and near syncope EXAM: PORTABLE CHEST 1 VIEW COMPARISON:  December 02, 2016 FINDINGS: There is no edema or consolidation. Heart is mildly enlarged with pulmonary vascularity within normal limits. No adenopathy. No evident bone lesions. IMPRESSION: Mild cardiac enlargement, stable.  No edema or consolidation. Electronically Signed   By: Lowella Grip III M.D.   On: 02/22/2017 13:47     Assessment/Plan Principal Problem:   Acute diverticulitis Active Problems:   Sleep apnea   PAF (paroxysmal atrial fibrillation) (HCC)   NASH (nonalcoholic steatohepatitis)   Type 2 diabetes mellitus ([employer])   Essential hypertension   Thrombocytopenia ([place])    1. Acute sigmoid diverticulitis - since patient has nausea vomiting will keep nothing by mouth overnight in reassess in a.m. for diet. Patient is on Zosyn. Continue with gentle hydration. Patient states he is due for colonoscopy in January with Dr. Deatra Ina. 2. Paroxysmal atrial fibrillation/atrial flutter - since  patient may need procedure if there is any further worsening I have Patient on heparin instead of Apixaban. Patient is on Coreg and Cardizem for rate control. 3. Diabetes mellitus type 2 per the chart - patient is not on any medications. Follow CBGs. 4. Acute on Chronic kidney disease stage III - creatinine mildly worsened from patient's baseline. Probably due to vomiting. Closely monitor metabolic panel intake output. 5. Hypertension - since patient is receiving gentle hydration will hold hydrochlorothiazide and closely monitor blood pressure with when necessary IV hydralazine. 6. Severe sleep apnea on BiPAP. 7. Chronic thrombocytopenia mildly worsened probably secondary to infectious process - follow CBC. 8. History of NASH per the chart.  I have reviewed patient's old charts.   DVT prophylaxis: SCDs due to thrombocytopenia. Code Status: Full code.  Family Communication: Discussed with patient.  Disposition Plan: Home.  Consults called: None.  Admission status: Inpatient.    Rise Patience MD [facility] Pager [phone].  If 7PM-7AM, please contact night-coverage [email or web address] Password [email or web address]  02/22/2017, 7:50 PM

## 2017-02-22 NOTE — ED Notes (Signed)
First lactic acid normal discontinued following lactic acid

## 2017-02-23 DIAGNOSIS — K5792 Diverticulitis of intestine, part unspecified, without perforation or abscess without bleeding: Secondary | ICD-10-CM

## 2017-02-23 DIAGNOSIS — K7581 Nonalcoholic steatohepatitis (NASH): Secondary | ICD-10-CM

## 2017-02-23 DIAGNOSIS — D696 Thrombocytopenia, unspecified: Secondary | ICD-10-CM

## 2017-02-23 DIAGNOSIS — G4730 Sleep apnea, unspecified: Secondary | ICD-10-CM

## 2017-02-23 DIAGNOSIS — I10 Essential (primary) hypertension: Secondary | ICD-10-CM

## 2017-02-23 DIAGNOSIS — E1122 Type 2 diabetes mellitus with diabetic chronic kidney disease: Secondary | ICD-10-CM

## 2017-02-23 DIAGNOSIS — I480 Paroxysmal atrial fibrillation: Secondary | ICD-10-CM

## 2017-02-23 DIAGNOSIS — N179 Acute kidney failure, unspecified: Secondary | ICD-10-CM

## 2017-02-23 LAB — BASIC METABOLIC PANEL
Anion gap: 9 (ref 5–15)
BUN: 27 mg/dL — ABNORMAL HIGH (ref 6–20)
CO2: 23 mmol/L (ref 22–32)
Calcium: 8.6 mg/dL — ABNORMAL LOW (ref 8.9–10.3)
Chloride: 105 mmol/L (ref 101–111)
Creatinine, Ser: 2.01 mg/dL — ABNORMAL HIGH (ref 0.61–1.24)
GFR calc Af Amer: 36 mL/min — ABNORMAL LOW (ref >60)
GFR calc non Af Amer: 31 mL/min — ABNORMAL LOW (ref >60)
Glucose, Bld: 124 mg/dL — ABNORMAL HIGH (ref 65–99)
Potassium: 3.7 mmol/L (ref 3.5–5.1)
Sodium: 137 mmol/L (ref 135–145)

## 2017-02-23 LAB — APTT: aPTT: 110 seconds — ABNORMAL HIGH (ref 24–36)

## 2017-02-23 LAB — CBC
HCT: 37.4 % — ABNORMAL LOW (ref 39.0–52.0)
Hemoglobin: 12.9 g/dL — ABNORMAL LOW (ref 13.0–17.0)
MCH: 31 pg (ref 26.0–34.0)
MCHC: 34.5 g/dL (ref 30.0–36.0)
MCV: 89.9 fL (ref 78.0–100.0)
Platelets: 54 10*3/uL — ABNORMAL LOW (ref 150–400)
RBC: 4.16 MIL/uL — ABNORMAL LOW (ref 4.22–5.81)
RDW: 13.2 % (ref 11.5–15.5)
WBC: 5.3 10*3/uL (ref 4.0–10.5)

## 2017-02-23 LAB — GLUCOSE, CAPILLARY
Glucose-Capillary: 110 mg/dL — ABNORMAL HIGH (ref 65–99)
Glucose-Capillary: 125 mg/dL — ABNORMAL HIGH (ref 65–99)
Glucose-Capillary: 128 mg/dL — ABNORMAL HIGH (ref 65–99)
Glucose-Capillary: 128 mg/dL — ABNORMAL HIGH (ref 65–99)
Glucose-Capillary: 125 mg/dL — ABNORMAL HIGH (ref 65–99)
Glucose-Capillary: 152 mg/dL — ABNORMAL HIGH (ref 65–99)

## 2017-02-23 NOTE — Progress Notes (Signed)
PROGRESS NOTE    Martin Cordova  SNK:[id number] DOB: 1940-09-29 DOA: 02/22/2017 PCP: Susy Frizzle, MD   Chief Complaint  Patient presents with  . Abdominal Pain    Brief Narrative:  HPI On 02/22/2017 by Dr. Gean Birchwood Azeem Poorman Martin Cordova is a 76 y.o. male with history of severe obstructive sleep apnea on BiPAP, atrial flutter, hypertension, chronic kidney disease and morbid obesity presents to the ER with complaints of persistent abdominal pain with nausea vomiting. Patient's symptoms started 3 days ago with left lower quadrant abdominal pain subsequently developing nausea vomiting and fever and chills. Patient had taken antibiotics for the PCP as outpatient. Despite which patient was still having pain and worsening nausea vomiting. Denies any diarrhea.   Assessment & Plan   Sepsis and Abdominal pain with nausea and vomiting secondary to acute sigmoid diverticulitis -Presented with fever, tachypnea -Patient feels his pain has mildly improved unless his stomach is touched. Denies a further nausea this morning. -CT abdomen and pelvis: Proximal sigmoid diverticulitis. No drainable fluid collection or abscess. No free air. -Continue Zosyn and IV fluids -Will place patient on full liquid diet -Continue antiemetics and pain control as needed  Paroxysmal atrial fibrillation/flutter -Currently rate controlled, continue Coreg and Cardizem -Placed on heparin in case procedure warranted -Patient takes Elqiuis at home  Diabetes mellitus, type II -Appears to be diet controlled, currently on no home medications -Monitor CBGs  Acute on chronic kidney disease, stage III -Creatinine was 2.36 on admission, suspect likely due to dehydration -baseline creatinine 1.7-2 over the past few years -Creatinine currently 2.01 -Continue IVF and monitor BMP  Essential hypertension -HCTZ held -Continue IV hydralazine as needed  Severe sleep apnea -Continue BiPAP qHS  Chronic  thrombocytopenia -platelets 54, baseline low 100s  -suspect drop likely due to infection  History of NASH -stable, LFTS WNL  Hyperlipidemia -continue statin, omega   DVT Prophylaxis  heparin  Code Status: Full  Family Communication: wife at bedside  Disposition Plan: Admitted  Consultants none  Procedures  none  Antibiotics   Anti-infectives    Start     Dose/Rate Route Frequency Ordered Stop   02/22/17 2030  piperacillin-tazobactam (ZOSYN) IVPB 3.375 g     3.375 g 12.5 mL/hr over 240 Minutes Intravenous Every 8 hours 02/22/17 2004     02/22/17 1330  piperacillin-tazobactam (ZOSYN) IVPB 3.375 g     3.375 g 100 mL/hr over 30 Minutes Intravenous  Once 02/22/17 1315 02/22/17 1438      Subjective:   Martin Cordova seen and examined today.  His abdominal pain is improving however when abdomen is pressed on, he does feel more left-sided pain. Denies current nausea or vomiting. Does feel hungry and thirsty. Denies chest pain, shortness breath, dizziness or headache. Objective:   Vitals:   02/22/17 2230 02/22/17 2340 02/23/17 0000 02/23/17 0259  BP:  (!) 131/55  (!) 121/51  Pulse:  66 (!) 51 (!) 59  Resp:  18 18 18   Temp:  (!) 101.3 F (38.5 C)  99.5 F (37.5 C)  TempSrc:  Oral  Axillary  SpO2: 94% 92% 93% 95%  Weight:  113.6 kg (250 lb 6.4 oz)  114.4 kg (252 lb 1.6 oz)  Height:  6\' 1"  (1.854 m)      Intake/Output Summary (Last 24 hours) at 02/23/17 1303 Last data filed at 02/23/17 1145  Gross per 24 hour  Intake             4647 ml  Output  450 ml  Net             4197 ml   Filed Weights   02/22/17 1412 02/22/17 2340 02/23/17 0259  Weight: 115.2 kg (254 lb) 113.6 kg (250 lb 6.4 oz) 114.4 kg (252 lb 1.6 oz)    Exam  General: Well developed, well nourished, NAD, appears stated age  77: NCAT, mucous membranes moist.   Cardiovascular: S1 S2 auscultated, RRR  Respiratory: Clear to auscultation bilaterally with equal chest rise  Abdomen:  Soft, obese, LLQ TTP with referred pain from RLQ, nondistended, + bowel sounds  Extremities: warm dry without cyanosis clubbing or edema  Neuro: AAOx3, nonfocal  Psych: Normal affect and demeanor   Data Reviewed: I have personally reviewed following labs and imaging studies  CBC:  Recent Labs Lab 02/22/17 1358 02/23/17 0733  WBC 5.6 5.3  NEUTROABS 5.0  --   HGB 13.1 12.9*  HCT 39.6 37.4*  MCV 89.2 89.9  PLT 63* 54*   Basic Metabolic Panel:  Recent Labs Lab 02/22/17 1358 02/23/17 0733  NA 134* 137  K 4.0 3.7  CL 99* 105  CO2 21* 23  GLUCOSE 175* 124*  BUN 33* 27*  CREATININE 2.36* 2.01*  CALCIUM 8.9 8.6*   GFR: Estimated Creatinine Clearance: 42.7 mL/min (A) (by C-G formula based on SCr of 2.01 mg/dL (H)). Liver Function Tests:  Recent Labs Lab 02/22/17 1358  AST 35  ALT 42  ALKPHOS 44  BILITOT 2.2*  PROT 6.6  ALBUMIN 3.4*   No results for input(s): LIPASE, AMYLASE in the last 168 hours. No results for input(s): AMMONIA in the last 168 hours. Coagulation Profile: No results for input(s): INR, PROTIME in the last 168 hours. Cardiac Enzymes: No results for input(s): CKTOTAL, CKMB, CKMBINDEX, TROPONINI in the last 168 hours. BNP (last 3 results) No results for input(s): PROBNP in the last 8760 hours. HbA1C: No results for input(s): HGBA1C in the last 72 hours. CBG:  Recent Labs Lab 02/22/17 2044 02/23/17 0257 02/23/17 0651 02/23/17 0729 02/23/17 1201  GLUCAP 136* 110* 128* 125* 128*   Lipid Profile: No results for input(s): CHOL, HDL, LDLCALC, TRIG, CHOLHDL, LDLDIRECT in the last 72 hours. Thyroid Function Tests: No results for input(s): TSH, T4TOTAL, FREET4, T3FREE, THYROIDAB in the last 72 hours. Anemia Panel: No results for input(s): VITAMINB12, FOLATE, FERRITIN, TIBC, IRON, RETICCTPCT in the last 72 hours. Urine analysis:    Component Value Date/Time   COLORURINE YELLOW 02/22/2017 1421   APPEARANCEUR CLEAR 02/22/2017 1421   LABSPEC  1.015 02/22/2017 1421   PHURINE 5.0 02/22/2017 1421   GLUCOSEU NEGATIVE 02/22/2017 1421   GLUCOSEU NEGATIVE 04/03/2007 0746   HGBUR NEGATIVE 02/22/2017 1421   BILIRUBINUR NEGATIVE 02/22/2017 1421   BILIRUBINUR neg 07/26/2011 1009   KETONESUR NEGATIVE 02/22/2017 1421   PROTEINUR 100 (A) 02/22/2017 1421   UROBILINOGEN 0.2 07/26/2011 1009   UROBILINOGEN 0.2 09/25/2008 1945   NITRITE NEGATIVE 02/22/2017 1421   LEUKOCYTESUR NEGATIVE 02/22/2017 1421   Sepsis Labs: @LABRCNTIP (procalcitonin:4,lacticidven:4)  )No results found for this or any previous visit (from the past 240 hour(s)).    Radiology Studies: Ct Abdomen Pelvis Wo Contrast  Result Date: 02/22/2017 CLINICAL DATA:  Abdominal pain, fever, evaluate for diverticulitis EXAM: CT ABDOMEN AND PELVIS WITHOUT CONTRAST TECHNIQUE: Multidetector CT imaging of the abdomen and pelvis was performed following the standard protocol without IV contrast. COMPARISON:  CT abdomen/pelvis dated 08/15/2011 FINDINGS: Lower chest: Mild patchy right lower lobe opacity, likely atelectasis. Hepatobiliary: Mild hepatic steatosis.  Status post cholecystectomy. No intrahepatic extrahepatic ductal dilatation. Pancreas: Within normal limits. Spleen: Within normal limits. Adrenals/Urinary Tract: Adrenal glands within normal limits. Multiple bilateral renal cysts of varying sizes and complexities, measuring up to 8.6 cm in the left lower kidney (series 3/image 85), reflecting autosomal dominant polycystic kidney disease. No renal calculi or hydronephrosis. Bladder is within normal limits. Stomach/Bowel: Stomach is notable for a small hiatal hernia. No evidence of bowel obstruction. Normal appendix (series 3/ image 66). Left colonic diverticulosis, with associated inflammatory changes in the left lower quadrant (series 3/image 32), suggesting proximal sigmoid diverticulitis. No drainable fluid collection/ abscess. Trace pericolonic fluid. No free air to suggest macroscopic  perforation. Vascular/Lymphatic: No evidence of abdominal aortic aneurysm. Atherosclerotic calcifications of the abdominal aorta and branch vessels. No suspicious abdominopelvic lymphadenopathy. Reproductive: Prostate is unremarkable. Other: No abdominopelvic ascites. Tiny fat containing right inguinal hernia. Mild fat within the left inguinal canal. Musculoskeletal: Degenerative changes of the visualized thoracolumbar spine. IMPRESSION: Proximal sigmoid diverticulitis. No drainable fluid collection/abscess. No free air. Additional stable ancillary findings as above. Electronically Signed   By: Julian Hy M.D.   On: 02/22/2017 17:37   Dg Chest Port 1 View  Result Date: 02/22/2017 CLINICAL DATA:  Fever and near syncope EXAM: PORTABLE CHEST 1 VIEW COMPARISON:  December 02, 2016 FINDINGS: There is no edema or consolidation. Heart is mildly enlarged with pulmonary vascularity within normal limits. No adenopathy. No evident bone lesions. IMPRESSION: Mild cardiac enlargement, stable.  No edema or consolidation. Electronically Signed   By: Lowella Grip III M.D.   On: 02/22/2017 13:47     Scheduled Meds: . atorvastatin  10 mg Oral QHS  . carvedilol  37.5 mg Oral BID WC  . diltiazem  240 mg Oral Daily  . omega-3 acid ethyl esters  2 capsule Oral Daily   Continuous Infusions: . sodium chloride 1,000 mL (02/23/17 1144)  . heparin 1,300 Units/hr (02/23/17 0851)  . piperacillin-tazobactam (ZOSYN)  IV 3.375 g (02/23/17 1145)     LOS: 1 day   Time Spent in minutes   30 minutes  Tyrea Froberg D.O. on 02/23/2017 at 1:03 PM  Between 7am to 7pm - Pager - [phone]  After 7pm go to [email or web address] - password [email or web address]  And look for the night coverage person covering for me after hours  [facility] Office  [phone]

## 2017-02-23 NOTE — Progress Notes (Signed)
[place] for heparin Indication: atrial fibrillation  Allergies  Allergen Reactions  . Ibuprofen     Per the patient, he has "kidney and liver issues" and is NOT suppose to take this  . Lisinopril Swelling    Renal insufficiency also    Patient Measurements: Height: 6\' 1"  (185.4 cm) Weight: 252 lb 1.6 oz (114.4 kg) IBW/kg (Calculated) : 79.9 Heparin Dosing Weight: 100kg  Vital Signs: Temp: 99.5 F (37.5 C) (09/20 0259) Temp Source: Axillary (09/20 0259) BP: 121/51 (09/20 0259) Pulse Rate: 59 (09/20 0259)  Labs:  Recent Labs  02/22/17 1358 02/23/17 0733  HGB 13.1 12.9*  HCT 39.6 37.4*  PLT 63* 54*  APTT  --  110*  CREATININE 2.36* 2.01*    Estimated Creatinine Clearance: 42.7 mL/min (A) (by C-G formula based on SCr of 2.01 mg/dL (H)).   Medications:  Infusions:  . sodium chloride 75 mL/hr at 02/22/17 2014  . heparin 1,400 Units/hr (02/22/17 2224)  . piperacillin-tazobactam (ZOSYN)  IV Stopped (02/23/17 0932)    Assessment: 20 yom with diverticulitis on chronic apixaban for history of afib. Last dose was this morning at 0830. To transition to IV heparin. Baseline H/H is WNL but platelets are low. Will need to monitor closely.   Initial PTT = 110 seconds, slightly above goal  Goal of Therapy:  Heparin level 0.3-0.7 units/ml aPTT 66-102 seconds Monitor platelets by anticoagulation protocol: Yes   Plan:  Decrease heparin to 1300 units / hr Daily aPTT, heparin level and CBC  Thank you Anette Guarneri, PharmD [phone]  02/23/2017,8:47 AM

## 2017-02-23 NOTE — Progress Notes (Signed)
Patient received from ED via bed. Patient is alert and oriented x4. Vital signs are stable. Skin assessment done with another nurse found intact. Patient given PRN meds for pain and nausea. Given instruction about call light and phone. Bed in low position and side rail up x2.

## 2017-02-23 NOTE — Progress Notes (Signed)
Pt placed on BIPAP for the night.  Tolerating well. Pt able to don and remove mask.

## 2017-02-24 LAB — BASIC METABOLIC PANEL
Anion gap: 6 (ref 5–15)
BUN: 26 mg/dL — ABNORMAL HIGH (ref 6–20)
CO2: 25 mmol/L (ref 22–32)
Calcium: 8.6 mg/dL — ABNORMAL LOW (ref 8.9–10.3)
Chloride: 105 mmol/L (ref 101–111)
Creatinine, Ser: 2.02 mg/dL — ABNORMAL HIGH (ref 0.61–1.24)
GFR calc Af Amer: 36 mL/min — ABNORMAL LOW (ref >60)
GFR calc non Af Amer: 31 mL/min — ABNORMAL LOW (ref >60)
Glucose, Bld: 112 mg/dL — ABNORMAL HIGH (ref 65–99)
Potassium: 3.7 mmol/L (ref 3.5–5.1)
Sodium: 136 mmol/L (ref 135–145)

## 2017-02-24 LAB — CBC
HCT: 37 % — ABNORMAL LOW (ref 39.0–52.0)
Hemoglobin: 12.4 g/dL — ABNORMAL LOW (ref 13.0–17.0)
MCH: 30.1 pg (ref 26.0–34.0)
MCHC: 33.5 g/dL (ref 30.0–36.0)
MCV: 89.8 fL (ref 78.0–100.0)
Platelets: 62 10*3/uL — ABNORMAL LOW (ref 150–400)
RBC: 4.12 MIL/uL — ABNORMAL LOW (ref 4.22–5.81)
RDW: 13.3 % (ref 11.5–15.5)
WBC: 4.1 10*3/uL (ref 4.0–10.5)

## 2017-02-24 LAB — APTT: aPTT: 74 seconds — ABNORMAL HIGH (ref 24–36)

## 2017-02-24 LAB — HEPARIN LEVEL (UNFRACTIONATED): Heparin Unfractionated: 1 IU/mL — ABNORMAL HIGH (ref 0.30–0.70)

## 2017-02-24 LAB — GLUCOSE, CAPILLARY
Glucose-Capillary: 111 mg/dL — ABNORMAL HIGH (ref 65–99)
Glucose-Capillary: 114 mg/dL — ABNORMAL HIGH (ref 65–99)
Glucose-Capillary: 130 mg/dL — ABNORMAL HIGH (ref 65–99)

## 2017-02-24 MED ORDER — TRAMADOL HCL 50 MG PO TABS: 50.0000 mg | ORAL_TABLET | Freq: Four times a day (QID) | ORAL | 0 refills | Status: DC | PRN

## 2017-02-24 MED ORDER — APIXABAN 5 MG PO TABS
5.0000 mg | ORAL_TABLET | Freq: Two times a day (BID) | ORAL | Status: DC
  Administered 2017-02-24: 5 mg via ORAL
  Filled 2017-02-24: qty 1

## 2017-02-24 NOTE — Discharge Summary (Signed)
Physician Discharge Summary  Martin Cordova EHU:[id number] DOB: May 09, 1941 DOA: 02/22/2017  PCP: Susy Frizzle, MD  Admit date: 02/22/2017 Discharge date: 02/24/2017  Time spent: 45 minutes  Recommendations for Outpatient Follow-up:  Patient will be discharged to home.  Patient will need to follow up with primary care provider within one week of discharge. Follow up with gastroenterology.  Patient should continue medications as prescribed.  Patient should follow a soft diet.   Discharge Diagnoses:  Sepsis and Abdominal pain with nausea and vomiting secondary to acute sigmoid diverticulitis Paroxysmal atrial fibrillation/flutter Diabetes mellitus, type II Acute on chronic kidney disease, stage III Essential hypertension Severe sleep apnea Chronic thrombocytopenia History of NASH Hyperlipidemia  Discharge Condition: Stable  Diet recommendation: Soft   Filed Weights   02/22/17 2340 02/23/17 0259 02/24/17 0141  Weight: 113.6 kg (250 lb 6.4 oz) 114.4 kg (252 lb 1.6 oz) 113.7 kg (250 lb 11.2 oz)    History of present illness:  On 02/22/2017 by Dr. Lorn Junes Smothersis a 76 y.o.malewith history of severe obstructive sleep apnea on BiPAP, atrial flutter, hypertension, chronic kidney disease and morbid obesity presents to the ER with complaints of persistent abdominal pain with nausea vomiting. Patient's symptoms started 3 days ago with left lower quadrant abdominal pain subsequently developing nausea vomiting and fever and chills. Patient had taken antibiotics for the PCP as outpatient. Despite which patient was still having pain and worsening nausea vomiting. Denies any diarrhea.  Hospital Course:  Sepsis and Abdominal pain with nausea and vomiting secondary to acute sigmoid diverticulitis -Presented with fever, tachypnea -Patient feels his pain has mildly improved unless his stomach is touched. Denies a further nausea this morning. -CT abdomen and pelvis:  Proximal sigmoid diverticulitis. No drainable fluid collection or abscess. No free air. -Was placed on Continue Zosyn and IV fluids -was able to tolerate soft diet -will discharge patient with cipro/flagyl -advised patient to follow up with gastroenterology in 2-3 weeks  Paroxysmal atrial fibrillation/flutter -Currently rate controlled, continue Coreg and Cardizem -Placed on heparin in case procedure warranted -Restarted Eliquis today  Diabetes mellitus, type II -Appears to be diet controlled, currently on no home medications  Acute on chronic kidney disease, stage III -Creatinine was 2.36 on admission, suspect likely due to dehydration -baseline creatinine 1.7-2 over the past few years -Creatinine currently 2.02 -Continue IVF and monitor BMP  Essential hypertension -HCTZ held, may resume upon discharge  Severe sleep apnea -Continue BiPAP qHS  Chronic thrombocytopenia -platelets 62, baseline low 100s  -suspect drop likely due to infection  History of NASH -stable, LFTS WNL  Hyperlipidemia -continue statin, omega 3  Procedures: None  Consultations: None  Discharge Exam: Vitals:   02/24/17 0742 02/24/17 1417  BP:  120/68  Pulse: 87 74  Resp: 18 18  Temp:  98.3 F (36.8 C)  SpO2: 92% 97%   Patient states his abdominal pain is improving. Denies chest pain, shortness of breath, nausea, vomiting, diarrhea, constipation.    General: Well developed, well nourished, NAD, appears stated age  16: NCAT, mucous membranes moist.  Cardiovascular: S1 S2 auscultated, RRR, no murmurs  Respiratory: Clear to auscultation bilaterally with equal chest rise  Abdomen: Soft, obese, nontender, nondistended, + bowel sounds  Extremities: warm dry without cyanosis clubbing or edema  Neuro: AAOx3, nonfocal  Psych: Appropriate mood and affect, pleasant  Discharge Instructions Discharge Instructions    Discharge instructions    Complete by:  As directed     Patient will be discharged  to home.  Patient will need to follow up with primary care provider within one week of discharge. Follow up with gastroenterology.  Patient should continue medications as prescribed.  Patient should follow a soft diet.     Current Discharge Medication List    START taking these medications   Details  traMADol (ULTRAM) 50 MG tablet Take 1 tablet (50 mg total) by mouth every 6 (six) hours as needed. Qty: 20 tablet, Refills: 0      CONTINUE these medications which have NOT CHANGED   Details  atorvastatin (LIPITOR) 10 MG tablet TAKE 1 TABLET BY MOUTH AT BEDTIME Qty: 30 tablet, Refills: 6    carvedilol (COREG) 25 MG tablet Take 1.5 tablets (37.5 mg total) by mouth 2 (two) times daily with a meal. Qty: 90 tablet, Refills: 6    ciprofloxacin (CIPRO) 500 MG tablet Take 1 tablet (500 mg total) by mouth 2 (two) times daily. Qty: 20 tablet, Refills: 0    diltiazem (CARDIZEM CD) 240 MG 24 hr capsule Take 1 capsule (240 mg total) by mouth daily. Qty: 90 capsule, Refills: 3    ELIQUIS 5 MG TABS tablet TAKE 1 TABLET BY MOUTH TWICE A DAY Qty: 60 tablet, Refills: 5    hydrochlorothiazide (HYDRODIURIL) 25 MG tablet Take 0.5 tablets (12.5 mg total) by mouth daily. OK TO TAKE EXTRA 1/2 TAB PRN SWELLING Qty: 30 tablet, Refills: 5    HYDROcodone-acetaminophen (NORCO) 5-325 MG tablet Take 1 tablet by mouth every 6 (six) hours as needed for moderate pain. Qty: 30 tablet, Refills: 0    metroNIDAZOLE (FLAGYL) 500 MG tablet Take 1 tablet (500 mg total) by mouth 2 (two) times daily. Qty: 20 tablet, Refills: 0    Multiple Vitamins-Minerals (CENTRUM SILVER 50+MEN) TABS Take 1 tablet by mouth daily.     NON FORMULARY BIPAP at bedtime    omega-3 acid ethyl esters (LOVAZA) 1 g capsule TAKE 2 CAPSULES BY MOUTH EVERY DAY Qty: 60 capsule, Refills: 4    potassium citrate (UROCIT-K) 10 MEQ (1080 MG) SR tablet Take 10 mEq by mouth 2 (two) times daily.     sildenafil (VIAGRA) 100 MG  tablet Take 0.5-1 tablets (50-100 mg total) by mouth daily as needed for erectile dysfunction. Qty: 5 tablet, Refills: 11       Allergies  Allergen Reactions  . Ibuprofen     Per the patient, he has "kidney and liver issues" and is NOT suppose to take this  . Lisinopril Swelling    Renal insufficiency also   Follow-up Information    Susy Frizzle, MD. Schedule an appointment as soon as possible for a visit in 1 week(s).   Specialty:  Family Medicine Why:  Hospital follow up  Contact information: [address] [place] [address] [phone]            The results of significant diagnostics from this hospitalization (including imaging, microbiology, ancillary and laboratory) are listed below for reference.    Significant Diagnostic Studies: Ct Abdomen Pelvis Wo Contrast  Result Date: 02/22/2017 CLINICAL DATA:  Abdominal pain, fever, evaluate for diverticulitis EXAM: CT ABDOMEN AND PELVIS WITHOUT CONTRAST TECHNIQUE: Multidetector CT imaging of the abdomen and pelvis was performed following the standard protocol without IV contrast. COMPARISON:  CT abdomen/pelvis dated 08/15/2011 FINDINGS: Lower chest: Mild patchy right lower lobe opacity, likely atelectasis. Hepatobiliary: Mild hepatic steatosis. Status post cholecystectomy. No intrahepatic extrahepatic ductal dilatation. Pancreas: Within normal limits. Spleen: Within normal limits. Adrenals/Urinary Tract: Adrenal  glands within normal limits. Multiple bilateral renal cysts of varying sizes and complexities, measuring up to 8.6 cm in the left lower kidney (series 3/image 85), reflecting autosomal dominant polycystic kidney disease. No renal calculi or hydronephrosis. Bladder is within normal limits. Stomach/Bowel: Stomach is notable for a small hiatal hernia. No evidence of bowel obstruction. Normal appendix (series 3/ image 66). Left colonic diverticulosis, with associated inflammatory changes in the left lower quadrant  (series 3/image 32), suggesting proximal sigmoid diverticulitis. No drainable fluid collection/ abscess. Trace pericolonic fluid. No free air to suggest macroscopic perforation. Vascular/Lymphatic: No evidence of abdominal aortic aneurysm. Atherosclerotic calcifications of the abdominal aorta and branch vessels. No suspicious abdominopelvic lymphadenopathy. Reproductive: Prostate is unremarkable. Other: No abdominopelvic ascites. Tiny fat containing right inguinal hernia. Mild fat within the left inguinal canal. Musculoskeletal: Degenerative changes of the visualized thoracolumbar spine. IMPRESSION: Proximal sigmoid diverticulitis. No drainable fluid collection/abscess. No free air. Additional stable ancillary findings as above. Electronically Signed   By: Julian Hy M.D.   On: 02/22/2017 17:37   Dg Chest Port 1 View  Result Date: 02/22/2017 CLINICAL DATA:  Fever and near syncope EXAM: PORTABLE CHEST 1 VIEW COMPARISON:  December 02, 2016 FINDINGS: There is no edema or consolidation. Heart is mildly enlarged with pulmonary vascularity within normal limits. No adenopathy. No evident bone lesions. IMPRESSION: Mild cardiac enlargement, stable.  No edema or consolidation. Electronically Signed   By: Lowella Grip III M.D.   On: 02/22/2017 13:47    Microbiology: Recent Results (from the past 240 hour(s))  Blood Culture (routine x 2)     Status: None (Preliminary result)   Collection Time: 02/22/17  1:58 PM  Result Value Ref Range Status   Specimen Description BLOOD RIGHT HAND  Final   Special Requests   Final    BOTTLES DRAWN AEROBIC AND ANAEROBIC Blood Culture adequate volume   Culture NO GROWTH 2 DAYS  Final   Report Status PENDING  Incomplete  Blood Culture (routine x 2)     Status: None (Preliminary result)   Collection Time: 02/22/17  1:59 PM  Result Value Ref Range Status   Specimen Description BLOOD RIGHT FOREARM  Final   Special Requests   Final    BOTTLES DRAWN AEROBIC AND ANAEROBIC  Blood Culture adequate volume   Culture NO GROWTH 2 DAYS  Final   Report Status PENDING  Incomplete     Labs: Basic Metabolic Panel:  Recent Labs Lab 02/22/17 1358 02/23/17 0733 02/24/17 0344  NA 134* 137 136  K 4.0 3.7 3.7  CL 99* 105 105  CO2 21* 23 25  GLUCOSE 175* 124* 112*  BUN 33* 27* 26*  CREATININE 2.36* 2.01* 2.02*  CALCIUM 8.9 8.6* 8.6*   Liver Function Tests:  Recent Labs Lab 02/22/17 1358  AST 35  ALT 42  ALKPHOS 44  BILITOT 2.2*  PROT 6.6  ALBUMIN 3.4*   No results for input(s): LIPASE, AMYLASE in the last 168 hours. No results for input(s): AMMONIA in the last 168 hours. CBC:  Recent Labs Lab 02/22/17 1358 02/23/17 0733 02/24/17 0344  WBC 5.6 5.3 4.1  NEUTROABS 5.0  --   --   HGB 13.1 12.9* 12.4*  HCT 39.6 37.4* 37.0*  MCV 89.2 89.9 89.8  PLT 63* 54* 62*   Cardiac Enzymes: No results for input(s): CKTOTAL, CKMB, CKMBINDEX, TROPONINI in the last 168 hours. BNP: BNP (last 3 results) No results for input(s): BNP in the last 8760 hours.  ProBNP (last 3  results) No results for input(s): PROBNP in the last 8760 hours.  CBG:  Recent Labs Lab 02/23/17 1709 02/23/17 2112 02/24/17 0324 02/24/17 0753 02/24/17 1216  GLUCAP 125* 152* 111* 114* 130*       Signed:  Hayato Guaman  [facility] 02/24/2017, 4:44 PM

## 2017-02-24 NOTE — Progress Notes (Signed)
[place] for heparin Indication: atrial fibrillation  Allergies  Allergen Reactions  . Ibuprofen     Per the patient, he has "kidney and liver issues" and is NOT suppose to take this  . Lisinopril Swelling    Renal insufficiency also    Patient Measurements: Height: 6\' 1"  (185.4 cm) Weight: 250 lb 11.2 oz (113.7 kg) IBW/kg (Calculated) : 79.9 Heparin Dosing Weight: 100kg  Vital Signs: Temp: 98.2 F (36.8 C) (09/21 0528) Temp Source: Oral (09/21 0528) BP: 118/73 (09/21 0528) Pulse Rate: 87 (09/21 0742)  Labs:  Recent Labs  02/22/17 1358 02/23/17 0733 02/24/17 0344  HGB 13.1 12.9* 12.4*  HCT 39.6 37.4* 37.0*  PLT 63* 54* 62*  APTT  --  110* 74*  HEPARINUNFRC  --   --  1.00*  CREATININE 2.36* 2.01* 2.02*    Estimated Creatinine Clearance: 42.4 mL/min (A) (by C-G formula based on SCr of 2.02 mg/dL (H)).   Medications:  Infusions:  . heparin 1,300 Units/hr (02/23/17 1456)  . piperacillin-tazobactam (ZOSYN)  IV Stopped (02/24/17 0855)    Assessment: 47 yom with diverticulitis on chronic apixaban for history of afib. Last dose was this morning at 0830. To transition to IV heparin.   PTT = 74 seconds (therapeutic), heparin level elevated at 1, lab values not yet correlating  Goal of Therapy:  Heparin level 0.3-0.7 units/ml aPTT 66-102 seconds Monitor platelets by anticoagulation protocol: Yes   Plan:  Continue heparin at 1300 units / hr Daily aPTT, heparin level and CBC  Thank you Anette Guarneri, PharmD [phone]  02/24/2017,8:26 AM

## 2017-02-24 NOTE — Progress Notes (Signed)
Patient was discharged home by MD order; discharged instructions  review and give to patient with care notes and prescription; IV DIC; skin intact; patient will be escorted to the car by nurse tech via wheelchair.  

## 2017-02-27 ENCOUNTER — Other Ambulatory Visit: Payer: Self-pay | Admitting: Cardiovascular Disease

## 2017-02-27 LAB — CULTURE, BLOOD (ROUTINE X 2)
Culture: NO GROWTH
Culture: NO GROWTH
Special Requests: ADEQUATE
Special Requests: ADEQUATE

## 2017-02-28 ENCOUNTER — Ambulatory Visit (INDEPENDENT_AMBULATORY_CARE_PROVIDER_SITE_OTHER): Payer: Medicare Other | Admitting: Family Medicine

## 2017-02-28 ENCOUNTER — Encounter: Payer: Self-pay | Admitting: Family Medicine

## 2017-02-28 VITALS — BP 134/80 | HR 86 | Temp 97.8°F | Resp 18 | Ht 71.0 in | Wt 248.0 lb

## 2017-02-28 DIAGNOSIS — K5792 Diverticulitis of intestine, part unspecified, without perforation or abscess without bleeding: Secondary | ICD-10-CM

## 2017-02-28 DIAGNOSIS — Z09 Encounter for follow-up examination after completed treatment for conditions other than malignant neoplasm: Secondary | ICD-10-CM | POA: Diagnosis not present

## 2017-02-28 NOTE — Progress Notes (Signed)
Subjective:    Patient ID: Martin Cordova, male    DOB: Jul 31, 1940, 76 y.o.   MRN: [id number]  HPI  02/21/17 Over the last 48 hours, the patient is developed acute onset of left lower quadrant abdominal pain. Pain is made worse by movement. It is better when he is completely still. He has had a fever to 101. He is tender to palpation in the left lower quadrant. He admits that he has been constipated recently. He has had a history of diverticulitis in the past, and he states that the pain is similar. He is also experiencing nausea and vomiting 2 however the emesis was nonbilious. He is passing gas. He has hyperactive bowel sounds throughout on examination. He is tender to palpation. There is voluntary guarding. There is no rebound. There is no evidence of an acute abdomen at the present time. He denies any dysuria. He denies any hematuria. He denies any urgency or frequency. The pain does not radiate anywhere but remains stationary in the left lower quadrant.  At that time, my plan was: I believe the patient has diverticulitis. I want him to start Cipro 500 mg by mouth twice a day for 10 days and Flagyl 500 mg by mouth twice a day for 10 days immediately. He can use Linzess 145 mcg poqday as needed for constipation. I also gave him a prescription for Norco 5/325, one by mouth every 4 hours when necessary pain. However if the pain intensifies, the patient is to go to the emergency room for imaging of the abdomen and pelvis. We discussed the risk of perforation and abscess. At the present time I see no evidence of acute abdomen. He has voluntary guarding due to the pain but there is no evidence of rebound or peritonitis. If symptoms worsen, he agrees to go the emergency room. Otherwise recheck in 48 hours.  02/28/17 Ultimately, the patient was admitted with sigmoid diverticulitis confirmed on CT scan. He failed outpatient antibiotics and required IV antibiotics to gain the foot hold. Upon presentation to  the emergency room, he had a fever to 101, he was dehydrated. Creatinine was elevated at 2.36 and his platelet count dropped to 66. However after IV fluids, his creatinine improved to 2.02 at the time of discharge. He is here today for follow-up. He has an additional 5 days of Cipro and Flagyl. He denies any fevers or chills. His abdominal pain has completely subsided. He is having normal bowel movements. He has resumed his hydrochlorothiazide and his blood pressure is stable Past Medical History:  Diagnosis Date  . Colon polyps   . Diabetes mellitus without complication ([facility])   . Diverticulitis   . Diverticulosis   . GERD 12/07/2006  . HYPERGLYCEMIA 12/07/2006  . HYPERLIPIDEMIA 12/07/2006   mixed wth an atherogenic dyslipidemic pattern  . HYPERTENSION 12/07/2006  . Nephrolithiasis   . Nonalcoholic fatty liver disease   . Normal coronary arteries 04/06/2007   by cath EF 55%., last ECHO 10/13/10 EF>55% mild MR, last nuc 06/16/10 EF 57% low risk scan  . OBESITY 11/10/2009  . OSA treated with BiPAP 10/22/2007  . PAF (paroxysmal atrial fibrillation) ([place]) 04/09/2013  . Palpitations    tachy  . Pulmonary HTN ([facility])    RV syst.  40-65mmHg- moderate by echo  . Renal insufficiency    Cr to 3 on ACE  . THROMBOCYTOPENIA 12/07/2006  . TRANSAMINASES, SERUM, ELEVATED 12/07/2006  . 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 Claiborne Billings   Past  Surgical History:  Procedure Laterality Date  . CARDIAC CATHETERIZATION  04/06/2007   normal cors  . CARDIOVERSION N/A 08/05/2013   Procedure: CARDIOVERSION;  Surgeon: Troy Sine, MD;  Location: [facility];  Service: Cardiovascular;  Laterality: N/A;  . CARDIOVERSION N/A 12/26/2016   Procedure: CARDIOVERSION;  Surgeon: Troy Sine, MD;  Location: [facility] ENDOSCOPY;  Service: Cardiovascular;  Laterality: N/A;  . CHOLECYSTECTOMY    . CYSTOSCOPY/RETROGRADE/URETEROSCOPY  08/19/2011   Procedure: CYSTOSCOPY/RETROGRADE/URETEROSCOPY;  Surgeon: Hanley Ben, MD;   Location: [facility];  Service: Urology;  Laterality: Right;  JJ STENT PLACEMENT   . ROTATOR CUFF REPAIR Right    Current Outpatient Prescriptions on File Prior to Visit  Medication Sig Dispense Refill  . atorvastatin (LIPITOR) 10 MG tablet TAKE 1 TABLET BY MOUTH AT BEDTIME (Patient taking differently: Take 10 mg by mouth at bedtime) 30 tablet 6  . carvedilol (COREG) 25 MG tablet Take 1.5 tablets (37.5 mg total) by mouth 2 (two) times daily with a meal. 90 tablet 6  . ciprofloxacin (CIPRO) 500 MG tablet Take 1 tablet (500 mg total) by mouth 2 (two) times daily. 20 tablet 0  . diltiazem (CARDIZEM CD) 240 MG 24 hr capsule Take 1 capsule (240 mg total) by mouth daily. 90 capsule 3  . ELIQUIS 5 MG TABS tablet TAKE 1 TABLET BY MOUTH TWICE A DAY (Patient taking differently: Take 5 mg by mouth two times a day) 60 tablet 5  . hydrochlorothiazide (HYDRODIURIL) 25 MG tablet Take 0.5 tablets (12.5 mg total) by mouth daily. OK TO TAKE EXTRA 1/2 TAB PRN SWELLING (Patient taking differently: Take 12.5 mg by mouth daily. AND MAY TAKE AN ADDITIONAL 12.5 MG ONCE A DAY AS NEEDED FOR SWELLING) 30 tablet 5  . HYDROcodone-acetaminophen (NORCO) 5-325 MG tablet Take 1 tablet by mouth every 6 (six) hours as needed for moderate pain. 30 tablet 0  . metroNIDAZOLE (FLAGYL) 500 MG tablet Take 1 tablet (500 mg total) by mouth 2 (two) times daily. 20 tablet 0  . Multiple Vitamins-Minerals (CENTRUM SILVER 50+MEN) TABS Take 1 tablet by mouth daily.     . NON FORMULARY BIPAP at bedtime    . omega-3 acid ethyl esters (LOVAZA) 1 g capsule TAKE 2 CAPSULES BY MOUTH EVERY DAY 60 capsule 4  . potassium citrate (UROCIT-K) 10 MEQ (1080 MG) SR tablet Take 10 mEq by mouth 2 (two) times daily.     . sildenafil (VIAGRA) 100 MG tablet Take 0.5-1 tablets (50-100 mg total) by mouth daily as needed for erectile dysfunction. 5 tablet 11  . traMADol (ULTRAM) 50 MG tablet Take 1 tablet (50 mg total) by mouth every 6 (six) hours as  needed. 20 tablet 0   No current facility-administered medications on file prior to visit.    Allergies  Allergen Reactions  . Ibuprofen     Per the patient, he has "kidney and liver issues" and is NOT suppose to take this  . Lisinopril Swelling    Renal insufficiency also   Social History   Social History  . Marital status: Married    Spouse name: N/A  . Number of children: 4  . Years of education: N/A   Occupational History  . retired Pharmacist, hospital    Social History Main Topics  . Smoking status: Former Smoker    Years: 10.00    Types: Cigarettes    Quit date: 08/04/1980  . Smokeless tobacco: Former Systems developer    Types: Chew    Quit date:  08/08/2004  . Alcohol use 0.0 oz/week     Comment: 1 beer 2-3 times per month  . Drug use: No  . Sexual activity: Yes   Other Topics Concern  . Not on file   Social History Narrative  . No narrative on file       Review of Systems  All other systems reviewed and are negative.      Objective:   Physical Exam  Constitutional: He appears well-developed and well-nourished. No distress.  Neck: Neck supple.  Cardiovascular: Normal rate and normal heart sounds.  An irregularly irregular rhythm present.  Pulmonary/Chest: Effort normal and breath sounds normal. No respiratory distress. He has no wheezes. He has no rales.  Abdominal: Soft. He exhibits no distension and no mass. There is no tenderness. There is no rebound and no guarding.  Musculoskeletal: He exhibits no edema.  Lymphadenopathy:    He has no cervical adenopathy.  Skin: He is not diaphoretic.  Vitals reviewed.         Assessment & Plan:  Hospital discharge follow-up - Plan: BASIC METABOLIC PANEL WITH GFR, CBC with Differential  Diverticulitis  Clinically diverticulitis has resolved. I've asked the patient to continue Cipro and Flagyl to complete a full 10 day course of antibiotics. I will recheck his platelet count along with his renal function. Continue encourage that  he push fluids until his strength returns to normal.

## 2017-03-01 LAB — CBC WITH DIFFERENTIAL/PLATELET
Basophils Absolute: 28 cells/uL (ref 0–200)
Basophils Relative: 0.4 %
Eosinophils Absolute: 241 cells/uL (ref 15–500)
Eosinophils Relative: 3.4 %
HCT: 41.7 % (ref 38.5–50.0)
Hemoglobin: 14.2 g/dL (ref 13.2–17.1)
Lymphs Abs: 1292 cells/uL (ref 850–3900)
MCH: 30.4 pg (ref 27.0–33.0)
MCHC: 34.1 g/dL (ref 32.0–36.0)
MCV: 89.3 fL (ref 80.0–100.0)
MPV: 12.1 fL (ref 7.5–12.5)
Monocytes Relative: 8.8 %
Neutro Abs: 4913 cells/uL (ref 1500–7800)
Neutrophils Relative %: 69.2 %
Platelets: 159 10*3/uL (ref 140–400)
RBC: 4.67 10*6/uL (ref 4.20–5.80)
RDW: 13 % (ref 11.0–15.0)
Total Lymphocyte: 18.2 %
WBC mixed population: 625 cells/uL (ref 200–950)
WBC: 7.1 10*3/uL (ref 3.8–10.8)

## 2017-03-01 LAB — BASIC METABOLIC PANEL WITH GFR
BUN/Creatinine Ratio: 16 (calc) (ref 6–22)
BUN: 29 mg/dL — ABNORMAL HIGH (ref 7–25)
CO2: 24 mmol/L (ref 20–32)
Calcium: 9.5 mg/dL (ref 8.6–10.3)
Chloride: 104 mmol/L (ref 98–110)
Creat: 1.83 mg/dL — ABNORMAL HIGH (ref 0.70–1.18)
GFR, Est African American: 41 mL/min/{1.73_m2} — ABNORMAL LOW (ref >=60)
GFR, Est Non African American: 36 mL/min/{1.73_m2} — ABNORMAL LOW (ref >=60)
Glucose, Bld: 144 mg/dL — ABNORMAL HIGH (ref 65–99)
Potassium: 4.9 mmol/L (ref 3.5–5.3)
Sodium: 140 mmol/L (ref 135–146)

## 2017-03-15 ENCOUNTER — Ambulatory Visit (INDEPENDENT_AMBULATORY_CARE_PROVIDER_SITE_OTHER): Payer: Medicare Other | Admitting: Cardiovascular Disease

## 2017-03-15 ENCOUNTER — Encounter: Payer: Self-pay | Admitting: Cardiovascular Disease

## 2017-03-15 VITALS — BP 140/92 | HR 96 | Ht 71.0 in | Wt 248.0 lb

## 2017-03-15 DIAGNOSIS — N183 Chronic kidney disease, stage 3 unspecified: Secondary | ICD-10-CM

## 2017-03-15 DIAGNOSIS — I4892 Unspecified atrial flutter: Secondary | ICD-10-CM

## 2017-03-15 DIAGNOSIS — G4733 Obstructive sleep apnea (adult) (pediatric): Secondary | ICD-10-CM | POA: Diagnosis not present

## 2017-03-15 DIAGNOSIS — E1122 Type 2 diabetes mellitus with diabetic chronic kidney disease: Secondary | ICD-10-CM

## 2017-03-15 DIAGNOSIS — E785 Hyperlipidemia, unspecified: Secondary | ICD-10-CM

## 2017-03-15 DIAGNOSIS — I10 Essential (primary) hypertension: Secondary | ICD-10-CM | POA: Diagnosis not present

## 2017-03-15 MED ORDER — DILTIAZEM HCL ER COATED BEADS 300 MG PO CP24: 300.0000 mg | ORAL_CAPSULE | Freq: Every day | ORAL | 3 refills | Status: DC

## 2017-03-15 NOTE — Addendum Note (Signed)
Addended by: Zebedee Iba on: 03/15/2017 04:33 PM   Modules accepted: Orders

## 2017-03-15 NOTE — Patient Instructions (Signed)
Medication Instructions:  INCREASE diltiazem (Cardizem CD) to 300 mg daily.  Follow-Up: Your physician recommends that you schedule a follow-up appointment in: 3 MONTHS with Dr. Claiborne Billings.   You have been referred to: Electrophysiologist at our [place] have been referred to: Dr. Buddy Duty, Endocrinologist     Any Other Special Instructions Will Be Listed Below (If Applicable).     If you need a refill on your cardiac medications before your next appointment, please call your pharmacy.

## 2017-03-15 NOTE — Progress Notes (Signed)
Patient ID: DANIS PEMBLETON, male   DOB: [birth year], 76 y.o.   MRN: [id number]      HPI: Turner Baillie Rouch is a 76 y.o. male is who presents to the office today for a 3 month follow-up cardiology evaluation.  Mr. 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 In the past, he developed an obstructive uropathy attributed to kidney stones; creatinine had risen up to 3 and ultimately improved and stabilized at approximately 1.7. Mr. Dobek uses his BiPAP daily and does note good sleep.  In the past he had issues with mild peripheral edema, which ultimately improved.  He had been previously taken off his lisinopril when his creatinine had risen in the setting of his obstructive uropathy.  In 2015 he developed recurrent episodes of palpitations and was found to have recurrent paroxysmal atrial fibrillation/flutter.  His medications were adjusted and  he was started on antiarrhythmic therapy with Rythmol to take in addition to his increasing doses of carvedilol.  He was started on Eliquis for anticoagulation.  He underwent successful cardioversion on 08/05/2013.  Since that time, he is unaware of any recurrent atrial fibrillation.  He has noticed more energy. He had been maintained on 50 mg twice a day of carvedilol in addition to Rythmol 225 mg every 8 hours, but due to slow pulse rates, these doses have ultimately been reduced.  Mr. 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 He has been on CPAP therapy initially for approximately 8 years and has been on BiPAP auto SV for over 4 years. He admits to using his BiPAP with 100% compliance.  He obtained a new BiPAP machine in June.  He feels that his machine is significantly  improved from his prior one. When I last saw him I reviewed his  download of his new BiPAP auto SV machine indicates that 90% of the time is EPAP pressure was 17 and 90% of the time his pressure support was 5.8, giving an IPAP of 21.8 cm.  He is using it 100% of the time and is averaging 5 hours and 49 minutes on his most recent download from 12/07/2013 through 01/05/2014.  His average AHI was 7.5.  His device setting maximum BiPAP pressure is 25 cm in maximum EPAP pressure 21 cm.  His average central event index is 1.3, and average obstructive apneic index 0.2, with an average copy index of 6.0.  Average breath.  Rate is 18 breaths per minute with a minute ventilation of 12.1.  Average total body may 670 mL.  When I saw him on 10/21/15 he was unaware of any arrhythmia.  He was on carvedilol 12.5 mg twice a day, Rythmol 225 mg twice a day, hydralazine 25 mg twice a day and eliquis  5 mg twice a day.  He also has been taking omeprazole for GERD and atorvastatin 10 mg for hyperlipidemia. He was found to have recurrent atrial flutter with variable block.  At that time, I recommended that he increase his Rythmol and take 225 mg every 8 hours and further increased his carvedilol to 18.75 mg twice a day.  Laboratory had revealed a magnesium of 2.0.  He has stage III chronic kidney disease and  his BUN was 26, creatinine 1.71 with a GFR estimate of 39.  Potassium was 4.8.  TSH was normal at 2.4.  Insomnia.  One week later in follow-up he had reverted back to sinus rhythm..  When I saw him in September 2017, he was maintaining sinus rhythm.  Since then, he admits to weight gain.  In early April, he began to notice some increasing shortness of breath and elevated heart rate and fatigue.  He was seen by [name] on 09/07/2016 and was found to be back in atrial flutter with a ventricular rate at 90.  He has been on eloquence 5 g twice a day for anticoagulation and was on carvedilol 18.75 twice a day.  Carvedilol dose was  increased to 25 mg twice a day.  He was also told at that time to try increasing his Propofenone to 300 mg 3 times a day.  He took this for several days but did not tolerate the increased dose and reduced it back to its previous dose of 225 mg every 8 hours.  When I saw him on 10/20/2016  I increased carvedilol to 37.5 mg twice a day.  He has continued to use his BiPAP therapy for sleep apnea.    When I saw him in June 2018 he was still in atrial flutter with 3:1 block.  I discontinued hydralazine and started him on Cardizem CD 180 mg, both for blood pressure and potential antiarrhythmic therapy. He also was having ankle edema and I started him on low-dose hydrochlorothiazide.  His peripheral edema significantly improved.  He has felt better.  He denied episodes of chest tightness.  He underwent successful outpatient DC cardioversion by me on 12/26/2016.  He successfully cardioverted at 120 joules.  When he returned for a follow-up evaluation on 01/12/2017 and saw  [name], [facility] .  He was back in atrial flutter.  At that time his propafenone was discontinued.  Over the past several months, Mr. Eberwein has felt well.  At times there is some trace swelling in his ankles.  He continues to use his BiPAP with 100% compliance.  He was hospitalized on September 19 with an episode of diverticulitis and was discharged on 02/24/2017.  He denies any chest pain.  He is unaware of his heart racing.  He denies PND orthopnea.  He also tells me that he is concerned about his diabetes.  Remotely, he had been on metformin but this was stopped by his primary physician.  He was also placed on another medicine which was also stopped.  He's not been on any recent medications for his diabetes.  He would also like a referral for an endocrinologic evaluation.  He presents for evaluation.  Past Medical History  Diagnosis Date  . GERD 12/07/2006  . HYPERGLYCEMIA 12/07/2006  . HYPERLIPIDEMIA 12/07/2006    mixed wth an atherogenic  dyslipidemic pattern  . HYPERTENSION 12/07/2006  . OBESITY 11/10/2009  . THROMBOCYTOPENIA 12/07/2006  . TRANSAMINASES, SERUM, ELEVATED 12/07/2006  . SLEEP APNEA 10/22/2007    uses bipap  . 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 Claiborne Billings  . Diabetes mellitus without complication   . Renal insufficiency     Cr to 3 on ACE  . Nephrolithiasis   . Normal coronary arteries 04/06/2007    by cath EF 55%., last ECHO 10/13/10 EF>55% mild MR, last nuc 06/16/10 EF 57% low risk scan  . Pulmonary HTN     RV syst.  40-84mHg- moderate by echo  .  Palpitations     tachy  . PAF (paroxysmal atrial fibrillation) 04/09/2013    Past Surgical History  Procedure Laterality Date  . Cholecystectomy    . Rotator cuff repair    . Cystoscopy/retrograde/ureteroscopy  08/19/2011    Procedure: CYSTOSCOPY/RETROGRADE/URETEROSCOPY;  Surgeon: Hanley Ben, MD;  Location: [facility];  Service: Urology;  Laterality: Right;  JJ STENT PLACEMENT   . Cardiac catheterization  04/06/2007    normal cors    Allergies  Allergen Reactions  . Lisinopril     Renal insufficiency    Current Outpatient Prescriptions:  .  atorvastatin (LIPITOR) 10 MG tablet, TAKE 1 TABLET BY MOUTH AT BEDTIME (Patient taking differently: Take 10 mg by mouth at bedtime), Disp: 30 tablet, Rfl: 6 .  carvedilol (COREG) 25 MG tablet, Take 1.5 tablets (37.5 mg total) by mouth 2 (two) times daily with a meal., Disp: 90 tablet, Rfl: 6 .  ELIQUIS 5 MG TABS tablet, TAKE 1 TABLET BY MOUTH TWICE A DAY (Patient taking differently: Take 5 mg by mouth two times a day), Disp: 60 tablet, Rfl: 5 .  hydrochlorothiazide (HYDRODIURIL) 25 MG tablet, Take 0.5 tablets (12.5 mg total) by mouth daily. OK TO TAKE EXTRA 1/2 TAB PRN SWELLING (Patient taking differently: Take 12.5 mg by mouth daily. AND MAY TAKE AN ADDITIONAL 12.5 MG ONCE A DAY AS NEEDED FOR SWELLING), Disp: 30 tablet, Rfl: 5 .  Multiple Vitamins-Minerals (CENTRUM SILVER 50+MEN) TABS,  Take 1 tablet by mouth daily. , Disp: , Rfl:  .  NON FORMULARY, BIPAP at bedtime, Disp: , Rfl:  .  omega-3 acid ethyl esters (LOVAZA) 1 g capsule, TAKE 2 CAPSULES BY MOUTH EVERY DAY, Disp: 60 capsule, Rfl: 4 .  potassium citrate (UROCIT-K) 10 MEQ (1080 MG) SR tablet, Take 10 mEq by mouth 2 (two) times daily. , Disp: , Rfl:  .  sildenafil (VIAGRA) 100 MG tablet, Take 0.5-1 tablets (50-100 mg total) by mouth daily as needed for erectile dysfunction., Disp: 5 tablet, Rfl: 11 .  diltiazem (CARDIZEM CD) 300 MG 24 hr capsule, Take 1 capsule (300 mg total) by mouth daily., Disp: 90 capsule, Rfl: 3  Socially he is married has 4 children and 5 grandchildren. He is a distant relative to the "[place] brothers." He does try to walk and exercise. There is no tobacco use. He does drink occasional alcohol.   ROS General: Negative; No fevers, chills, or night sweats;  HEENT: Negative; No changes in vision or hearing, sinus congestion, difficulty swallowing Pulmonary: Negative; No cough, wheezing, shortness of breath, hemoptysis Cardiovascular: See history of present illness No recent peripheral edema GI: Status post recent episode of diverticulitis requiring 2 day hospitalization GU:  No dysuria, hematuria, or difficulty voiding; some difficulty with erectile function Musculoskeletal: Negative; no myalgias, joint pain, or weakness Hematologic/Oncology: Negative; no easy bruising, bleeding Endocrine: Negative; no heat/cold intolerance; no diabetes Neuro: Negative; no changes in balance, headaches Skin: Negative; No rashes or skin lesions Psychiatric: Negative; No behavioral problems, depression Sleep: He is using his BiPAP therapy with 100% compliance.  No snoring, daytime sleepiness, hypersomnolence, bruxism, restless legs, hypnogognic hallucinations, no cataplexy Other comprehensive 14 point system review is negative.  PE BP (!) 140/92   Pulse 96   Ht _0  (1.803 m)   Wt 248 lb (112.5 kg)   BMI  34.59 kg/m    Repeat blood pressure was 144/90  Wt Readings from Last 3 Encounters:  03/15/17 248 lb (112.5 kg)  02/28/17 248 lb (112.5  kg)  02/24/17 250 lb 11.2 oz (113.7 kg)   General: Alert, oriented, no distress.  Skin: normal turgor, no rashes, warm and dry HEENT: Normocephalic, atraumatic. Pupils equal round and reactive to light; sclera anicteric; extraocular muscles intact;  Nose without nasal septal hypertrophy Mouth/Parynx benign; Mallinpatti scale 3 Neck: No JVD, no carotid bruits; normal carotid upstroke Lungs: clear to ausculatation and percussion; no wheezing or rales Chest wall: without tenderness to palpitation Heart: PMI not displaced, irregular rhythm with rate in the 90s, s1 s2 normal, 1/6 systolic murmur, no diastolic murmur, no rubs, gallops, thrills, or heaves Abdomen: soft, nontender; no hepatosplenomehaly, BS+; abdominal aorta nontender and not dilated by palpation. Back: no CVA tenderness Pulses 2+ Musculoskeletal: full range of motion, normal strength, no joint deformities Extremities: Trace ankle edema bilaterally; no clubbing, cyanosis , Homan's sign negative  Neurologic: grossly nonfocal; Cranial nerves grossly wnl Psychologic: Normal mood and affect  ECG (independently read by me): Atrial flutter with variable block at 96 bpm.  Right bundle-branch block with repolarization changes.  QTc interval 437 ms  July 2018 ECG (independently read by me): Probable atrial flutter with a ventricular rate at 82 bpm.  Right bundle-branch block, left anterior hemiblock.  11/08/2016 ECG (independently read by me): Probable atrial flutter 3:1 block , ventricular rate at 85;  right bundle branch block with repolarization changes.  QTc interval 528 ms.   May 2018 ECG (independently read by me): atrial flutter with variable block with ventricular rate at 89 bpm.  Right bundle-branch block, left anterior hemiblock.  September 2017 ECG (independently read by me): Normal  sinus rhythm at 63 bpm.  First-degree AV block with a PR interval of 208 ms.  Right bundle branch block with repolarization changes.  QTc interval 466 ms.  10/28/15 ECG (independently read by me): Sinus bradycardia at 54 bpm.  First degree block with PR interval 222 ms.  Right bundle-branch block.  10/21/2015 ECG (independently read by me): Atrial flutter with variable block.  Right bundle-branch block with repolarization changes.  October 2016 ECG (independently read by me): sinus rhythm with first-degree AV block with a PR interval at 216 ms.  Ventricular rate 86 bpm with occasional PVCs.    May 2016 ECG (independently read by me): Normal sinus rhythm at 60 bpm.  Right bundle-branch block with repolarization changes.  November 2015 ECG (independently read by me): Normal sinus rhythm at 61.  Nonspecific T abnormality.  QTc interval 394 ms.  11/18/2013 ECG (independently read by me): Sinus bradycardia 58 beats per minute.  PR interval 198 ms; QTc interval 371 ms.  Nonspecific ST changes.  07/29/2013 ECG  (independently read by me): Atrial flutter with 2:1 block with a ventricular rate of 87 beats per minute. Atrial rate is approximately 360 ms. 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 fib flutter now with right bundle branch block and repolarization changes.  Prior ECG of 06/04/2013: EKG  suggests probable atrial flutter with a ventricular rate of 105 beats per minute. There also are frequent PVCs. QTc interval is 436 ms. They're nonspecific T changes.  LABS: BMP Latest Ref Rng & Units 02/28/2017 02/24/2017 02/23/2017  Glucose 65 - 99 mg/dL 144(H) 112(H) 124(H)  BUN 7 - 25 mg/dL 29(H) 26(H) 27(H)  Creatinine 0.70 - 1.18 mg/dL 1.83(H) 2.02(H) 2.01(H)  BUN/Creat Ratio 6 - 22 (calc) 16 - -  Sodium 135 - 146 mmol/L 140 136 137  Potassium 3.5 - 5.3 mmol/L  4.9 3.7 3.7  Chloride 98 - 110 mmol/L 104 105 105  CO2 20 - 32 mmol/L _0 Calcium 8.6 - 10.3 mg/dL 9.5 8.6(L) 8.6(L)   Hepatic Function Latest Ref Rng & Units 02/22/2017 10/25/2016 05/17/2016  Total Protein 6.5 - 8.1 g/dL 6.6 6.8 7.3  Albumin 3.5 - 5.0 g/dL 3.4(L) 4.3 3.4(L)  AST 15 - 41 U/L 35 19 22  ALT 17 - 63 U/L 42 25 26  Alk Phosphatase 38 - 126 U/L 44 39(L) 51  Total Bilirubin 0.3 - 1.2 mg/dL 2.2(H) 0.8 0.56  Bilirubin, Direct 0.0 - 0.3 mg/dL - - -   CBC Latest Ref Rng & Units 02/28/2017 02/24/2017 02/23/2017  WBC 3.8 - 10.8 Thousand/uL 7.1 4.1 5.3  Hemoglobin 13.2 - 17.1 g/dL 14.2 12.4(L) 12.9(L)  Hematocrit 38.5 - 50.0 % 41.7 37.0(L) 37.4(L)  Platelets 140 - 400 Thousand/uL 159 62(L) 54(L)   Lab Results  Component Value Date   TSH 2.40 10/26/2015   Lipid Panel     Component Value Date/Time   CHOL 110 10/25/2016 0853   TRIG 185 (H) 10/25/2016 0853   HDL 32 (L) 10/25/2016 0853   CHOLHDL 3.4 10/25/2016 0853   VLDL 37 (H) 10/25/2016 0853   LDLCALC 41 10/25/2016 0853   LDLDIRECT 65.0 04/12/2013 0825     RADIOLOGY: No results found.  IMPRESSION:  1. Atrial flutter, unspecified type ([place])   2. Essential hypertension   3. Type 2 diabetes mellitus with chronic kidney disease, without long-term current use of insulin, unspecified CKD stage ([place])   4. Obstructive sleep apnea treated with BiPAP   5. Hyperlipidemia LDL goal <70   6. CKD (chronic kidney disease), stage III [facility])     ASSESSMENT AND PLAN: Mr. Marasigan is a 76 year old Caucasian male who has documented normal coronary arteries by heart catheterization in 2008. He has a history of hypertension, mixed hyperlipidemia, and has paroxysmal atrial fibrillation/flutter. He has a history of diabetes mellitus and after he had lost a significant amount of weight and with renal insufficiency he was taken off his diabetic medication.  Subsequent, he has gained a majority of his weight back.  He has renal insufficiency, recent laboratory has shown his creatinine increased to 1.83.  He has had  a history of atrial flutter.  In July 2018 underwent his second cardioversion.  Despite being on his propafenone and using BiPAP, he can develop recurrent atrial flutter.  His ECG today shows atrial flutter with variable block, ranging from 08-05-39.  He is now on long-acting diltiazem 240 mg and carvedilol 37.5 mg twice a day for rate control.  He continues to be on eloquence 5 mg twice a day for anticoagulation.  He has been taking HCTZ 12.5 mg in the morning.  I long discussion with him in the office today.  I have recommended an electrophysiologic evaluation for consideration of possible atrial flutter ablation.  During that evaluation.  The risks and benefits of the ablation procedure can be discussed in detail with him as well as other potential antiarrhythmic options.  In the interim, I am increasing Cardizem CD to 300 mg.  I have suggested that if he notes more ankle edema.  He can take HCTZ 25 mg.  He is also requested that I refer him for endocrinologic evaluation.  His wife sees Dr. Buddy Duty and as result, I will refer him to Dr. Buddy Duty per his request .he is tolerating eloquence without bleeding.  He recently had an episode  of diverticulitis requiring antibiotic therapy, which has resolved.  He continues to be on atorvastatin for hyperlipidemia .  I will try to exercise.  He evaluation.  I will see him in 3 months for reevaluation with me.   Time spent 25 minutes Shelva Majestic, MD  03/15/2017  9:54 AM

## 2017-03-31 ENCOUNTER — Other Ambulatory Visit: Payer: Self-pay | Admitting: Family Medicine

## 2017-03-31 ENCOUNTER — Telehealth: Payer: Self-pay | Admitting: Family Medicine

## 2017-03-31 MED ORDER — METRONIDAZOLE 500 MG PO TABS: 500.0000 mg | ORAL_TABLET | Freq: Two times a day (BID) | ORAL | 0 refills | Status: DC

## 2017-03-31 MED ORDER — CIPROFLOXACIN HCL 500 MG PO TABS: 500.0000 mg | ORAL_TABLET | Freq: Two times a day (BID) | ORAL | 0 refills | Status: DC

## 2017-03-31 NOTE — Telephone Encounter (Signed)
Pt states that he is having abdominal pain again from diverticulitis and pickard told him to call in and let us know if this happened so that we could call in more antibiotic for him. He still uses [facility].

## 2017-03-31 NOTE — Telephone Encounter (Signed)
Pt aware.

## 2017-03-31 NOTE — Telephone Encounter (Signed)
I have already sent cipro and flagyl by emr.

## 2017-04-03 ENCOUNTER — Encounter: Payer: Self-pay | Admitting: Internal Medicine

## 2017-04-03 ENCOUNTER — Ambulatory Visit (INDEPENDENT_AMBULATORY_CARE_PROVIDER_SITE_OTHER): Payer: Medicare Other | Admitting: Internal Medicine

## 2017-04-03 ENCOUNTER — Other Ambulatory Visit: Payer: Self-pay | Admitting: Cardiovascular Disease

## 2017-04-03 VITALS — BP 140/92 | HR 95 | Resp 98 | Ht 71.0 in | Wt 245.0 lb

## 2017-04-03 DIAGNOSIS — Z01818 Encounter for other preprocedural examination: Secondary | ICD-10-CM | POA: Diagnosis not present

## 2017-04-03 DIAGNOSIS — I480 Paroxysmal atrial fibrillation: Secondary | ICD-10-CM

## 2017-04-03 DIAGNOSIS — I4892 Unspecified atrial flutter: Secondary | ICD-10-CM

## 2017-04-03 DIAGNOSIS — I509 Heart failure, unspecified: Secondary | ICD-10-CM | POA: Diagnosis not present

## 2017-04-03 NOTE — H&P (View-Only) (Signed)
ELECTROPHYSIOLOGY CONSULT NOTE  Patient ID: Martin Cordova, MRN: [id number], DOB/AGE: [birth year] 76 y.o. Admit date: (Not on file) Date of Consult: 04/03/2017  Primary Physician: Susy Frizzle, MD Primary Cardiologist: Leda Gauze     Martin Cordova is a 76 y.o. male who is being seen today for the evaluation of ATRIAL FLUTTER at the request of TK.    HPI Martin Cordova is a 76 y.o. male  Referred for the treatment considerations of paroxysmal atrial flutter.  This dates back to 2015.  He has had cardioversion on at least 2 occasions 2015, 18.   Atrial flutter has been associated with lightheadedness fatigue exercise intolerance with shortness of breath and some peripheral edema.  He has not had chest pain.  He felt considerably better after the first cardioversion.  More recent cardioversion will be held for a couple of days.  Thromboembolic risk profile is notable for age-46 hypertension-1 diabetes-1 resting heart failure-1 for a chads vas score of greater than or equal to 4  1/12 Myoview scan EF 57% low risk   Past Medical History:  Diagnosis Date  . Colon polyps   . Diabetes mellitus without complication ([place])   . Diverticulitis   . Diverticulosis   . GERD 12/07/2006  . HYPERGLYCEMIA 12/07/2006  . HYPERLIPIDEMIA 12/07/2006   mixed wth an atherogenic dyslipidemic pattern  . HYPERTENSION 12/07/2006  . Nephrolithiasis   . Nonalcoholic fatty liver disease   . Normal coronary arteries 04/06/2007   by cath EF 55%., last ECHO 10/13/10 EF>55% mild MR, last nuc 06/16/10 EF 57% low risk scan  . OBESITY 11/10/2009  . OSA treated with BiPAP 10/22/2007  . PAF (paroxysmal atrial fibrillation) ([place]) 04/09/2013  . Palpitations    tachy  . Pulmonary HTN (HCC)    RV syst.  40-59mmHg- moderate by echo  . Renal insufficiency    Cr to 3 on ACE  . THROMBOCYTOPENIA 12/07/2006  . TRANSAMINASES, SERUM, ELEVATED 12/07/2006  . 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 Claiborne Billings       Surgical History:  Past Surgical History:  Procedure Laterality Date  . CARDIAC CATHETERIZATION  04/06/2007   normal cors  . CARDIOVERSION N/A 08/05/2013   Procedure: CARDIOVERSION;  Surgeon: Troy Sine, MD;  Location: [facility];  Service: Cardiovascular;  Laterality: N/A;  . CARDIOVERSION N/A 12/26/2016   Procedure: CARDIOVERSION;  Surgeon: Troy Sine, MD;  Location[facility] ENDOSCOPY;  Service: Cardiovascular;  Laterality: N/A;  . CHOLECYSTECTOMY    . CYSTOSCOPY/RETROGRADE/URETEROSCOPY  08/19/2011   Procedure: CYSTOSCOPY/RETROGRADE/URETEROSCOPY;  Surgeon: Hanley Ben, MD;  Location: [facility];  Service: Urology;  Laterality: Right;  JJ STENT PLACEMENT   . ROTATOR CUFF REPAIR Right      Home Meds: Prior to Admission medications   Medication Sig Start Date End Date Taking? Authorizing Provider  atorvastatin (LIPITOR) 10 MG tablet TAKE 1 TABLET BY MOUTH AT BEDTIME 04/03/17  Yes Troy Sine, MD  carvedilol (COREG) 25 MG tablet Take 1.5 tablets (37.5 mg total) by mouth 2 (two) times daily with a meal. 10/20/16  Yes Troy Sine, MD  ciprofloxacin (CIPRO) 500 MG tablet Take 1 tablet (500 mg total) by mouth 2 (two) times daily. 03/31/17  Yes Susy Frizzle, MD  diltiazem (CARDIZEM CD) 300 MG 24 hr capsule Take 1 capsule (300 mg total) by mouth daily. 03/15/17  Yes Troy Sine, MD  ELIQUIS 5 MG TABS tablet TAKE 1 TABLET BY MOUTH  TWICE A DAY Patient taking differently: Take 5 mg by mouth two times a day 05/24/16  Yes Troy Sine, MD  metroNIDAZOLE (FLAGYL) 500 MG tablet Take 1 tablet (500 mg total) by mouth 2 (two) times daily. 03/31/17  Yes Susy Frizzle, MD  Multiple Vitamins-Minerals (CENTRUM SILVER 50+MEN) TABS Take 1 tablet by mouth daily.    Yes [provider]  NON FORMULARY BIPAP at bedtime   Yes [provider]  omega-3 acid ethyl esters (LOVAZA) 1 g capsule TAKE 2 CAPSULES BY MOUTH EVERY DAY 02/27/17  Yes Troy Sine, MD  potassium citrate (UROCIT-K) 10 MEQ (1080 MG) SR tablet Take 10 mEq by mouth 2 (two) times daily.  03/10/13  Yes [provider]  sildenafil (VIAGRA) 100 MG tablet Take 0.5-1 tablets (50-100 mg total) by mouth daily as needed for erectile dysfunction. 04/28/15  Yes Susy Frizzle, MD  hydrochlorothiazide (HYDRODIURIL) 25 MG tablet Take 0.5 tablets (12.5 mg total) by mouth daily. OK TO TAKE EXTRA 1/2 TAB PRN SWELLING Patient taking differently: Take 12.5 mg by mouth daily. AND MAY TAKE AN ADDITIONAL 12.5 MG ONCE A DAY AS NEEDED FOR SWELLING 12/15/16 03/15/17  Troy Sine, MD    Allergies:  Allergies  Allergen Reactions  . Ibuprofen     Per the patient, he has "kidney and liver issues" and is NOT suppose to take this  . Lisinopril Swelling    Renal insufficiency also    Social History   Social History  . Marital status: Married    Spouse name: N/A  . Number of children: 4  . Years of education: N/A   Occupational History  . retired Pharmacist, hospital    Social History Main Topics  . Smoking status: Former Smoker    Years: 10.00    Types: Cigarettes    Quit date: 08/04/1980  . Smokeless tobacco: Former Systems developer    Types: Chew    Quit date: 08/08/2004  . Alcohol use 0.0 oz/week     Comment: 1 beer 2-3 times per month  . Drug use: No  . Sexual activity: Yes   Other Topics Concern  . Not on file   Social History Narrative  . No narrative on file     Family History  Problem Relation Age of Onset  . Diabetes Mother   . Heart failure Mother   . Stroke Mother   . Kidney failure Father   . Diabetes Father   . Cancer Paternal Uncle      ROS:  Please see the history of present illness.     All other systems reviewed and negative.    Physical Exam: Blood pressure (!) 140/92, pulse 95, resp. rate (!) 98, height 5\' 11"  (1.803 m), weight 245 lb (111.1 kg). General: Well developed, well nourished male in no acute distress. Head: Normocephalic, atraumatic, sclera  non-icteric, no xanthomas, nares are without discharge. EENT: normal  Lymph Nodes:  none Neck: Negative for carotid bruits. JVD 7-8 cm. Back:without scoliosis kyphosis Lungs: Clear bilaterally to auscultation without wheezes, rales, or rhonchi. Breathing is unlabored. Heart: Irregularly irregular rate and rthym withoutmurmur . No rubs, or gallops appreciated. Abdomen: Soft, non-tender, non-distended with normoactive bowel sounds. No hepatomegaly. No rebound/guarding. No obvious abdominal masses. Msk:  Strength and tone appear normal for age. Extremities: No clubbing or cyanosis. 1-2+ edema.  Distal pedal pulses are 2+ and equal bilaterally. Skin: Warm and Dry Neuro: Alert and oriented X 3. CN III-XII intact Grossly normal  sensory and motor function . Psych:  Responds to questions appropriately with a normal affect.      Labs: Cardiac Enzymes No results for input(s): CKTOTAL, CKMB, TROPONINI in the last 72 hours. CBC Lab Results  Component Value Date   WBC 7.1 02/28/2017   HGB 14.2 02/28/2017   HCT 41.7 02/28/2017   MCV 89.3 02/28/2017   PLT 159 02/28/2017   PROTIME: No results for input(s): LABPROT, INR in the last 72 hours. Chemistry No results for input(s): NA, K, CL, CO2, BUN, CREATININE, CALCIUM, PROT, BILITOT, ALKPHOS, ALT, AST, GLUCOSE in the last 168 hours.  Invalid input(s): LABALBU Lipids Lab Results  Component Value Date   CHOL 110 10/25/2016   HDL 32 (L) 10/25/2016   LDLCALC 41 10/25/2016   TRIG 185 (H) 10/25/2016   BNP No results found for: PROBNP Thyroid Function Tests: No results for input(s): TSH, T4TOTAL, T3FREE, THYROIDAB in the last 72 hours.  Invalid input(s): FREET3 Miscellaneous No results found for: DDIMER  Radiology/Studies:  No results found.  EKG: Personally reviewed  6/15--10/16 occasional PVCs and occasional PACs were noted 5/17   atrial flutter 4/18   atrial flutter 5/18-8/18 atrial flutter 9/18 sinus rhythm 10/18   atrial flutter   Variable conduction 95  -/15/38  Axis -30  RBBB LAD   Assessment and Plan:  Atrial flutter-persistent  Congestive heart failure--chronic  Right bundle branch block left anterior fascicular block    Patient has persistent atrial flutter.  He is undergone a number of cardioversions.  It is appropriate to consider catheter ablation.  Echocardiogram will be useful for prognostication.  I also worry that some of his congestive symptoms may be related to cardiomyopathy secondary to rapid rates.  Albeit not super rapid.  We reviewed the physiology of atrial flutter  On Anticoagulation;  No bleeding issues  Reviewed risks and benefits of catheter ablation  Would be done w anesthesia      Virl Axe

## 2017-04-03 NOTE — Patient Instructions (Addendum)
Medication Instructions: Your physician recommends that you continue on your current medications as directed. Please refer to the Current Medication list given to you today.  Labwork: Your physician recommends that you return for lab work on Monday 04/10/17: BMET and CBC   Procedures/Testing: Your physician has recommended that you have an Atrial Flutter ablation. Catheter ablation is a medical procedure used to treat some cardiac arrhythmias (irregular heartbeats). During catheter ablation, a long, thin, flexible tube is put into a blood vessel in your groin (upper thigh), or neck. This tube is called an ablation catheter. It is then guided to your heart through the blood vessel. Radio frequency waves destroy small areas of heart tissue where abnormal heartbeats may cause an arrhythmia to start. Please see the instruction sheet given to you today.  Your physician has requested that you have an echocardiogram. Echocardiography is a painless test that uses sound waves to create images of your heart. It provides your doctor with information about the size and shape of your heart and how well your heart's chambers and valves are working. This procedure takes approximately one hour. There are no restrictions for this procedure.  Follow-Up: Your physician recommends that you schedule a follow-up appointment in 1 MONTH form 04/19/17 with Dr. Caryl Comes.    Any Additional Special Instructions Will Be Listed Below (If Applicable).     If you need a refill on your cardiac medications before your next appointment, please call your pharmacy.

## 2017-04-03 NOTE — Progress Notes (Signed)
ELECTROPHYSIOLOGY CONSULT NOTE  Patient ID: Martin Cordova, MRN: [id number], DOB/AGE: 74-25-1942 76 y.o. Admit date: (Not on file) Date of Consult: 04/03/2017  Primary Physician: Susy Frizzle, MD Primary Cardiologist: Leda Gauze     Martin Cordova is a 76 y.o. male who is being seen today for the evaluation of ATRIAL FLUTTER at the request of TK.    HPI Martin Cordova is a 76 y.o. male  Referred for the treatment considerations of paroxysmal atrial flutter.  This dates back to 2015.  He has had cardioversion on at least 2 occasions 2015, 18.   Atrial flutter has been associated with lightheadedness fatigue exercise intolerance with shortness of breath and some peripheral edema.  He has not had chest pain.  He felt considerably better after the first cardioversion.  More recent cardioversion will be held for a couple of days.  Thromboembolic risk profile is notable for age-64 hypertension-1 diabetes-1 resting heart failure-1 for a chads vas score of greater than or equal to 4  1/12 Myoview scan EF 57% low risk   Past Medical History:  Diagnosis Date  . Colon polyps   . Diabetes mellitus without complication ([place])   . Diverticulitis   . Diverticulosis   . GERD 12/07/2006  . HYPERGLYCEMIA 12/07/2006  . HYPERLIPIDEMIA 12/07/2006   mixed wth an atherogenic dyslipidemic pattern  . HYPERTENSION 12/07/2006  . Nephrolithiasis   . Nonalcoholic fatty liver disease   . Normal coronary arteries 04/06/2007   by cath EF 55%., last ECHO 10/13/10 EF>55% mild MR, last nuc 06/16/10 EF 57% low risk scan  . OBESITY 11/10/2009  . OSA treated with BiPAP 10/22/2007  . PAF (paroxysmal atrial fibrillation) ([place]) 04/09/2013  . Palpitations    tachy  . Pulmonary HTN (HCC)    RV syst.  40-23mmHg- moderate by echo  . Renal insufficiency    Cr to 3 on ACE  . THROMBOCYTOPENIA 12/07/2006  . TRANSAMINASES, SERUM, ELEVATED 12/07/2006  . 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 Claiborne Billings       Surgical History:  Past Surgical History:  Procedure Laterality Date  . CARDIAC CATHETERIZATION  04/06/2007   normal cors  . CARDIOVERSION N/A 08/05/2013   Procedure: CARDIOVERSION;  Surgeon: Troy Sine, MD;  Location: [facility];  Service: Cardiovascular;  Laterality: N/A;  . CARDIOVERSION N/A 12/26/2016   Procedure: CARDIOVERSION;  Surgeon: Troy Sine, MD;  Location: [facility] ENDOSCOPY;  Service: Cardiovascular;  Laterality: N/A;  . CHOLECYSTECTOMY    . CYSTOSCOPY/RETROGRADE/URETEROSCOPY  08/19/2011   Procedure: CYSTOSCOPY/RETROGRADE/URETEROSCOPY;  Surgeon: Hanley Ben, MD;  Location: [facility];  Service: Urology;  Laterality: Right;  JJ STENT PLACEMENT   . ROTATOR CUFF REPAIR Right      Home Meds: Prior to Admission medications   Medication Sig Start Date End Date Taking? Authorizing Provider  atorvastatin (LIPITOR) 10 MG tablet TAKE 1 TABLET BY MOUTH AT BEDTIME 04/03/17  Yes Troy Sine, MD  carvedilol (COREG) 25 MG tablet Take 1.5 tablets (37.5 mg total) by mouth 2 (two) times daily with a meal. 10/20/16  Yes Troy Sine, MD  ciprofloxacin (CIPRO) 500 MG tablet Take 1 tablet (500 mg total) by mouth 2 (two) times daily. 03/31/17  Yes Susy Frizzle, MD  diltiazem (CARDIZEM CD) 300 MG 24 hr capsule Take 1 capsule (300 mg total) by mouth daily. 03/15/17  Yes Troy Sine, MD  ELIQUIS 5 MG TABS tablet TAKE 1 TABLET BY MOUTH  TWICE A DAY Patient taking differently: Take 5 mg by mouth two times a day 05/24/16  Yes Troy Sine, MD  metroNIDAZOLE (FLAGYL) 500 MG tablet Take 1 tablet (500 mg total) by mouth 2 (two) times daily. 03/31/17  Yes Susy Frizzle, MD  Multiple Vitamins-Minerals (CENTRUM SILVER 50+MEN) TABS Take 1 tablet by mouth daily.    Yes [provider]  NON FORMULARY BIPAP at bedtime   Yes [provider]  omega-3 acid ethyl esters (LOVAZA) 1 g capsule TAKE 2 CAPSULES BY MOUTH EVERY DAY 02/27/17  Yes Troy Sine, MD  potassium citrate (UROCIT-K) 10 MEQ (1080 MG) SR tablet Take 10 mEq by mouth 2 (two) times daily.  03/10/13  Yes [provider]  sildenafil (VIAGRA) 100 MG tablet Take 0.5-1 tablets (50-100 mg total) by mouth daily as needed for erectile dysfunction. 04/28/15  Yes Susy Frizzle, MD  hydrochlorothiazide (HYDRODIURIL) 25 MG tablet Take 0.5 tablets (12.5 mg total) by mouth daily. OK TO TAKE EXTRA 1/2 TAB PRN SWELLING Patient taking differently: Take 12.5 mg by mouth daily. AND MAY TAKE AN ADDITIONAL 12.5 MG ONCE A DAY AS NEEDED FOR SWELLING 12/15/16 03/15/17  Troy Sine, MD    Allergies:  Allergies  Allergen Reactions  . Ibuprofen     Per the patient, he has "kidney and liver issues" and is NOT suppose to take this  . Lisinopril Swelling    Renal insufficiency also    Social History   Social History  . Marital status: Married    Spouse name: N/A  . Number of children: 4  . Years of education: N/A   Occupational History  . retired Pharmacist, hospital    Social History Main Topics  . Smoking status: Former Smoker    Years: 10.00    Types: Cigarettes    Quit date: 08/04/1980  . Smokeless tobacco: Former Systems developer    Types: Chew    Quit date: 08/08/2004  . Alcohol use 0.0 oz/week     Comment: 1 beer 2-3 times per month  . Drug use: No  . Sexual activity: Yes   Other Topics Concern  . Not on file   Social History Narrative  . No narrative on file     Family History  Problem Relation Age of Onset  . Diabetes Mother   . Heart failure Mother   . Stroke Mother   . Kidney failure Father   . Diabetes Father   . Cancer Paternal Uncle      ROS:  Please see the history of present illness.     All other systems reviewed and negative.    Physical Exam: Blood pressure (!) 140/92, pulse 95, resp. rate (!) 98, height 5\' 11"  (1.803 m), weight 245 lb (111.1 kg). General: Well developed, well nourished male in no acute distress. Head: Normocephalic, atraumatic, sclera  non-icteric, no xanthomas, nares are without discharge. EENT: normal  Lymph Nodes:  none Neck: Negative for carotid bruits. JVD 7-8 cm. Back:without scoliosis kyphosis Lungs: Clear bilaterally to auscultation without wheezes, rales, or rhonchi. Breathing is unlabored. Heart: Irregularly irregular rate and rthym withoutmurmur . No rubs, or gallops appreciated. Abdomen: Soft, non-tender, non-distended with normoactive bowel sounds. No hepatomegaly. No rebound/guarding. No obvious abdominal masses. Msk:  Strength and tone appear normal for age. Extremities: No clubbing or cyanosis. 1-2+ edema.  Distal pedal pulses are 2+ and equal bilaterally. Skin: Warm and Dry Neuro: Alert and oriented X 3. CN III-XII intact Grossly normal  sensory and motor function . Psych:  Responds to questions appropriately with a normal affect.      Labs: Cardiac Enzymes No results for input(s): CKTOTAL, CKMB, TROPONINI in the last 72 hours. CBC Lab Results  Component Value Date   WBC 7.1 02/28/2017   HGB 14.2 02/28/2017   HCT 41.7 02/28/2017   MCV 89.3 02/28/2017   PLT 159 02/28/2017   PROTIME: No results for input(s): LABPROT, INR in the last 72 hours. Chemistry No results for input(s): NA, K, CL, CO2, BUN, CREATININE, CALCIUM, PROT, BILITOT, ALKPHOS, ALT, AST, GLUCOSE in the last 168 hours.  Invalid input(s): LABALBU Lipids Lab Results  Component Value Date   CHOL 110 10/25/2016   HDL 32 (L) 10/25/2016   LDLCALC 41 10/25/2016   TRIG 185 (H) 10/25/2016   BNP No results found for: PROBNP Thyroid Function Tests: No results for input(s): TSH, T4TOTAL, T3FREE, THYROIDAB in the last 72 hours.  Invalid input(s): FREET3 Miscellaneous No results found for: DDIMER  Radiology/Studies:  No results found.  EKG: Personally reviewed  6/15--10/16 occasional PVCs and occasional PACs were noted 5/17   atrial flutter 4/18   atrial flutter 5/18-8/18 atrial flutter 9/18 sinus rhythm 10/18   atrial flutter   Variable conduction 95  -/15/38  Axis -30  RBBB LAD   Assessment and Plan:  Atrial flutter-persistent  Congestive heart failure--chronic  Right bundle branch block left anterior fascicular block    Patient has persistent atrial flutter.  He is undergone a number of cardioversions.  It is appropriate to consider catheter ablation.  Echocardiogram will be useful for prognostication.  I also worry that some of his congestive symptoms may be related to cardiomyopathy secondary to rapid rates.  Albeit not super rapid.  We reviewed the physiology of atrial flutter  On Anticoagulation;  No bleeding issues  Reviewed risks and benefits of catheter ablation  Would be done w anesthesia      Virl Axe

## 2017-04-06 ENCOUNTER — Encounter: Payer: Self-pay | Admitting: Gastroenterology

## 2017-04-10 ENCOUNTER — Ambulatory Visit (HOSPITAL_COMMUNITY): Payer: Medicare Other | Attending: Internal Medicine

## 2017-04-10 ENCOUNTER — Other Ambulatory Visit: Payer: Self-pay

## 2017-04-10 ENCOUNTER — Other Ambulatory Visit: Payer: Medicare Other | Admitting: *Deleted

## 2017-04-10 DIAGNOSIS — E785 Hyperlipidemia, unspecified: Secondary | ICD-10-CM | POA: Diagnosis not present

## 2017-04-10 DIAGNOSIS — E1122 Type 2 diabetes mellitus with diabetic chronic kidney disease: Secondary | ICD-10-CM | POA: Insufficient documentation

## 2017-04-10 DIAGNOSIS — I130 Hypertensive heart and chronic kidney disease with heart failure and stage 1 through stage 4 chronic kidney disease, or unspecified chronic kidney disease: Secondary | ICD-10-CM | POA: Diagnosis not present

## 2017-04-10 DIAGNOSIS — I4892 Unspecified atrial flutter: Secondary | ICD-10-CM

## 2017-04-10 DIAGNOSIS — N189 Chronic kidney disease, unspecified: Secondary | ICD-10-CM | POA: Insufficient documentation

## 2017-04-10 DIAGNOSIS — G4730 Sleep apnea, unspecified: Secondary | ICD-10-CM | POA: Insufficient documentation

## 2017-04-10 DIAGNOSIS — I480 Paroxysmal atrial fibrillation: Secondary | ICD-10-CM

## 2017-04-10 DIAGNOSIS — I472 Ventricular tachycardia: Secondary | ICD-10-CM | POA: Insufficient documentation

## 2017-04-10 DIAGNOSIS — I080 Rheumatic disorders of both mitral and aortic valves: Secondary | ICD-10-CM | POA: Insufficient documentation

## 2017-04-10 DIAGNOSIS — I509 Heart failure, unspecified: Secondary | ICD-10-CM | POA: Diagnosis present

## 2017-04-10 DIAGNOSIS — Z01818 Encounter for other preprocedural examination: Secondary | ICD-10-CM

## 2017-04-10 LAB — BASIC METABOLIC PANEL
BUN/Creatinine Ratio: 14 (ref 10–24)
BUN: 24 mg/dL (ref 8–27)
CO2: 24 mmol/L (ref 20–29)
Calcium: 9.3 mg/dL (ref 8.6–10.2)
Chloride: 103 mmol/L (ref 96–106)
Creatinine, Ser: 1.71 mg/dL — ABNORMAL HIGH (ref 0.76–1.27)
GFR calc Af Amer: 45 mL/min/{1.73_m2} — ABNORMAL LOW (ref >59)
GFR calc non Af Amer: 39 mL/min/{1.73_m2} — ABNORMAL LOW (ref >59)
Glucose: 192 mg/dL — ABNORMAL HIGH (ref 65–99)
Potassium: 3.8 mmol/L (ref 3.5–5.2)
Sodium: 139 mmol/L (ref 134–144)

## 2017-04-10 LAB — CBC WITH DIFFERENTIAL/PLATELET
Basophils Absolute: 0 10*3/uL (ref 0.0–0.2)
Basos: 0 %
EOS (ABSOLUTE): 0.2 10*3/uL (ref 0.0–0.4)
Eos: 3 %
Hematocrit: 41.1 % (ref 37.5–51.0)
Hemoglobin: 13.5 g/dL (ref 13.0–17.7)
Immature Grans (Abs): 0 10*3/uL (ref 0.0–0.1)
Immature Granulocytes: 0 %
Lymphocytes Absolute: 0.8 10*3/uL (ref 0.7–3.1)
Lymphs: 15 %
MCH: 29.9 pg (ref 26.6–33.0)
MCHC: 32.8 g/dL (ref 31.5–35.7)
MCV: 91 fL (ref 79–97)
Monocytes Absolute: 0.6 10*3/uL (ref 0.1–0.9)
Monocytes: 11 %
Neutrophils Absolute: 3.7 10*3/uL (ref 1.4–7.0)
Neutrophils: 71 %
Platelets: 145 10*3/uL — ABNORMAL LOW (ref 150–379)
RBC: 4.51 x10E6/uL (ref 4.14–5.80)
RDW: 14.8 % (ref 12.3–15.4)
WBC: 5.2 10*3/uL (ref 3.4–10.8)

## 2017-04-12 ENCOUNTER — Telehealth: Payer: Self-pay

## 2017-04-12 NOTE — Telephone Encounter (Signed)
Pt is aware and agreeable to results. He is agreeable to procedure and I went over instruction sheet with him again.

## 2017-04-18 NOTE — Anesthesia Preprocedure Evaluation (Addendum)
Anesthesia Evaluation  Patient identified by MRN, date of birth, ID band Patient awake    Reviewed: Allergy & Precautions, H&P , NPO status , Patient's Chart, lab work & pertinent test results, reviewed documented beta blocker date and time   Airway Mallampati: II  TM Distance: >3 FB Neck ROM: Full    Dental no notable dental hx. (+) Teeth Intact, Dental Advisory Given   Pulmonary sleep apnea and Continuous Positive Airway Pressure Ventilation , former smoker,    Pulmonary exam normal breath sounds clear to auscultation       Cardiovascular Exercise Tolerance: Good hypertension, Pt. on medications and Pt. on home beta blockers  Rhythm:Regular Rate:Normal     Neuro/Psych negative neurological ROS  negative psych ROS   GI/Hepatic Neg liver ROS, GERD  Medicated and Controlled,  Endo/Other  diabetes  Renal/GU Renal InsufficiencyRenal disease  negative genitourinary   Musculoskeletal   Abdominal   Peds  Hematology negative hematology ROS (+)   Anesthesia Other Findings   Reproductive/Obstetrics negative OB ROS                            Anesthesia Physical Anesthesia Plan  ASA: III  Anesthesia Plan: General   Post-op Pain Management:    Induction: Intravenous  PONV Risk Score and Plan: 3 and Ondansetron, Midazolam and Dexamethasone  Airway Management Planned: Oral ETT  Additional Equipment:   Intra-op Plan:   Post-operative Plan: Extubation in OR  Informed Consent: I have reviewed the patients History and Physical, chart, labs and discussed the procedure including the risks, benefits and alternatives for the proposed anesthesia with the patient or authorized representative who has indicated his/her understanding and acceptance.   Dental advisory given  Plan Discussed with: CRNA  Anesthesia Plan Comments:        Anesthesia Quick Evaluation

## 2017-04-19 ENCOUNTER — Ambulatory Visit (HOSPITAL_COMMUNITY)
Admission: RE | Admit: 2017-04-19 | Discharge: 2017-04-19 | Disposition: A | Discharge status: Home / Self Care | Payer: Medicare Other | Source: Ambulatory Visit | Attending: Internal Medicine | Admitting: Internal Medicine

## 2017-04-19 ENCOUNTER — Ambulatory Visit (HOSPITAL_COMMUNITY): Payer: Medicare Other | Admitting: Anesthesiology

## 2017-04-19 ENCOUNTER — Encounter (HOSPITAL_COMMUNITY)
Admission: RE | Disposition: A | Discharge status: Home / Self Care | Payer: Self-pay | Source: Ambulatory Visit | Attending: Internal Medicine

## 2017-04-19 DIAGNOSIS — E119 Type 2 diabetes mellitus without complications: Secondary | ICD-10-CM | POA: Insufficient documentation

## 2017-04-19 DIAGNOSIS — I110 Hypertensive heart disease with heart failure: Secondary | ICD-10-CM | POA: Insufficient documentation

## 2017-04-19 DIAGNOSIS — Z7901 Long term (current) use of anticoagulants: Secondary | ICD-10-CM | POA: Insufficient documentation

## 2017-04-19 DIAGNOSIS — K760 Fatty (change of) liver, not elsewhere classified: Secondary | ICD-10-CM | POA: Diagnosis not present

## 2017-04-19 DIAGNOSIS — I452 Bifascicular block: Secondary | ICD-10-CM | POA: Diagnosis not present

## 2017-04-19 DIAGNOSIS — I272 Pulmonary hypertension, unspecified: Secondary | ICD-10-CM | POA: Diagnosis not present

## 2017-04-19 DIAGNOSIS — Z6834 Body mass index (BMI) 34.0-34.9, adult: Secondary | ICD-10-CM | POA: Diagnosis not present

## 2017-04-19 DIAGNOSIS — K219 Gastro-esophageal reflux disease without esophagitis: Secondary | ICD-10-CM | POA: Diagnosis not present

## 2017-04-19 DIAGNOSIS — I509 Heart failure, unspecified: Secondary | ICD-10-CM | POA: Diagnosis not present

## 2017-04-19 DIAGNOSIS — I483 Typical atrial flutter: Secondary | ICD-10-CM | POA: Insufficient documentation

## 2017-04-19 DIAGNOSIS — Z87891 Personal history of nicotine dependence: Secondary | ICD-10-CM | POA: Diagnosis not present

## 2017-04-19 DIAGNOSIS — E669 Obesity, unspecified: Secondary | ICD-10-CM | POA: Diagnosis not present

## 2017-04-19 DIAGNOSIS — E782 Mixed hyperlipidemia: Secondary | ICD-10-CM | POA: Insufficient documentation

## 2017-04-19 DIAGNOSIS — I4892 Unspecified atrial flutter: Secondary | ICD-10-CM | POA: Diagnosis not present

## 2017-04-19 DIAGNOSIS — G4733 Obstructive sleep apnea (adult) (pediatric): Secondary | ICD-10-CM | POA: Insufficient documentation

## 2017-04-19 HISTORY — PX: A-FLUTTER ABLATION: EP1230

## 2017-04-19 LAB — GLUCOSE, CAPILLARY
Glucose-Capillary: 132 mg/dL — ABNORMAL HIGH (ref 65–99)
Glucose-Capillary: 145 mg/dL — ABNORMAL HIGH (ref 65–99)

## 2017-04-19 SURGERY — A-FLUTTER ABLATION
Anesthesia: General

## 2017-04-19 MED ORDER — ROCURONIUM BROMIDE 100 MG/10ML IV SOLN
INTRAVENOUS | Status: DC | PRN
  Administered 2017-04-19: 20 mg via INTRAVENOUS
  Administered 2017-04-19: 40 mg via INTRAVENOUS

## 2017-04-19 MED ORDER — SODIUM CHLORIDE 0.9% FLUSH: 3.0000 mL | INTRAVENOUS | Status: DC | PRN

## 2017-04-19 MED ORDER — SODIUM CHLORIDE 0.9 % IV SOLN: 250.0000 mL | INTRAVENOUS | Status: DC | PRN

## 2017-04-19 MED ORDER — SODIUM CHLORIDE 0.9 % IV SOLN
INTRAVENOUS | Status: DC
  Administered 2017-04-19: 07:00:00 via INTRAVENOUS

## 2017-04-19 MED ORDER — LIDOCAINE HCL (CARDIAC) 20 MG/ML IV SOLN
INTRAVENOUS | Status: DC | PRN
  Administered 2017-04-19: 60 mg via INTRAVENOUS

## 2017-04-19 MED ORDER — HEPARIN (PORCINE) IN NACL 2-0.9 UNIT/ML-% IJ SOLN
INTRAMUSCULAR | Status: AC
  Filled 2017-04-19: qty 500

## 2017-04-19 MED ORDER — SUGAMMADEX SODIUM 200 MG/2ML IV SOLN
INTRAVENOUS | Status: DC | PRN
  Administered 2017-04-19: 200 mg via INTRAVENOUS

## 2017-04-19 MED ORDER — LIDOCAINE HCL 4 % EX SOLN
CUTANEOUS | Status: DC | PRN
  Administered 2017-04-19: 2 mL via TOPICAL

## 2017-04-19 MED ORDER — PROPOFOL 10 MG/ML IV BOLUS
INTRAVENOUS | Status: DC | PRN
  Administered 2017-04-19: 30 mg via INTRAVENOUS
  Administered 2017-04-19: 120 mg via INTRAVENOUS

## 2017-04-19 MED ORDER — SUCCINYLCHOLINE CHLORIDE 20 MG/ML IJ SOLN
INTRAMUSCULAR | Status: DC | PRN
  Administered 2017-04-19: 100 mg via INTRAVENOUS

## 2017-04-19 MED ORDER — BUPIVACAINE HCL (PF) 0.25 % IJ SOLN
INTRAMUSCULAR | Status: DC | PRN
  Administered 2017-04-19: 60 mL

## 2017-04-19 MED ORDER — SODIUM CHLORIDE 0.9% FLUSH: 3.0000 mL | Freq: Two times a day (BID) | INTRAVENOUS | Status: DC

## 2017-04-19 MED ORDER — DEXAMETHASONE SODIUM PHOSPHATE 10 MG/ML IJ SOLN
INTRAMUSCULAR | Status: DC | PRN
  Administered 2017-04-19: 10 mg via INTRAVENOUS

## 2017-04-19 MED ORDER — PHENYLEPHRINE HCL 10 MG/ML IJ SOLN
INTRAVENOUS | Status: DC | PRN
  Administered 2017-04-19: 20 ug/min via INTRAVENOUS

## 2017-04-19 MED ORDER — BUPIVACAINE HCL (PF) 0.25 % IJ SOLN
INTRAMUSCULAR | Status: AC
  Filled 2017-04-19: qty 30

## 2017-04-19 MED ORDER — HEPARIN (PORCINE) IN NACL 2-0.9 UNIT/ML-% IJ SOLN
INTRAMUSCULAR | Status: AC | PRN
  Administered 2017-04-19: 1000 mL

## 2017-04-19 MED ORDER — MIDAZOLAM HCL 5 MG/5ML IJ SOLN
INTRAMUSCULAR | Status: DC | PRN
  Administered 2017-04-19: 1 mg via INTRAVENOUS

## 2017-04-19 MED ORDER — SODIUM CHLORIDE 0.9 % IV SOLN: INTRAVENOUS | Status: AC

## 2017-04-19 MED ORDER — FENTANYL CITRATE (PF) 100 MCG/2ML IJ SOLN
INTRAMUSCULAR | Status: DC | PRN
  Administered 2017-04-19: 100 ug via INTRAVENOUS

## 2017-04-19 MED ORDER — ONDANSETRON HCL 4 MG/2ML IJ SOLN
INTRAMUSCULAR | Status: DC | PRN
Start: 2017-04-19 — End: 2017-04-19
  Administered 2017-04-19: 4 mg via INTRAVENOUS

## 2017-04-19 SURGICAL SUPPLY — 14 items
BAG SNAP BAND KOVER 36X36 (MISCELLANEOUS) ×1 IMPLANT
CATH BLAZERPRIME XP LG CV 10MM (ABLATOR) ×1 IMPLANT
CATH DUODECA HALO/ISMUS 7FR (CATHETERS) ×1 IMPLANT
CATH OCTAPOLOR 6F 125CM 2-5-2 (CATHETERS) ×1 IMPLANT
CATH QUAD COURNAND 5FR (CATHETERS) ×1 IMPLANT
HOVERMATT SINGLE USE (MISCELLANEOUS) ×1 IMPLANT
PACK EP LATEX FREE (CUSTOM PROCEDURE TRAY) ×2
PACK EP LF (CUSTOM PROCEDURE TRAY) IMPLANT
PAD DEFIB LIFELINK (PAD) ×1 IMPLANT
SHEATH ATRIAL FLUTTER SAFL 8F (SHEATH) ×1 IMPLANT
SHEATH PINNACLE 6F 10CM (SHEATH) ×1 IMPLANT
SHEATH PINNACLE 7F 10CM (SHEATH) ×1 IMPLANT
SHEATH PINNACLE 8F 10CM (SHEATH) ×2 IMPLANT
SHIELD RADPAD SCOOP 12X17 (MISCELLANEOUS) ×1 IMPLANT

## 2017-04-19 NOTE — Progress Notes (Signed)
Seen and groins examined Ok for discharge followup scheduled Continue current meds

## 2017-04-19 NOTE — Anesthesia Postprocedure Evaluation (Signed)
Anesthesia Post Note  Patient: Martin Cordova  Procedure(s) Performed: A-FLUTTER ABLATION (N/A )     Patient location during evaluation: PACU Anesthesia Type: General Level of consciousness: awake and alert Pain management: pain level controlled Vital Signs Assessment: post-procedure vital signs reviewed and stable Respiratory status: spontaneous breathing, nonlabored ventilation and respiratory function stable Cardiovascular status: blood pressure returned to baseline and stable Postop Assessment: no apparent nausea or vomiting Anesthetic complications: no    Last Vitals:  Vitals:   04/19/17 1145 04/19/17 1150  BP: 123/79 126/67  Pulse: (!) 57 (!) 56  Resp: 11 13  Temp:    SpO2: 92% 95%    Last Pain:  Vitals:   04/19/17 0634  TempSrc: Oral                 Shreyas Piatkowski,W. EDMOND

## 2017-04-19 NOTE — Progress Notes (Addendum)
Site area: Right groin a 7 and 8 french venous sheath was removed  Site Prior to Removal:  Level 0  Pressure Applied For 30 MINUTES    Bedrest Beginning at 1140p  Manual:   Yes.    Patient Status During Pull:  stable  Post Pull Groin Site:  Level 0  Post Pull Instructions Given:  Yes.    Post Pull Pulses Present:  Yes.    Dressing Applied:  Yes.    Comments:  VS remain stable

## 2017-04-19 NOTE — Anesthesia Procedure Notes (Signed)
Procedure Name: Intubation Date/Time: 04/19/2017 8:41 AM Performed by: Izora Gala, CRNA Pre-anesthesia Checklist: Patient identified, Emergency Drugs available, Suction available and Patient being monitored Patient Re-evaluated:Patient Re-evaluated prior to induction Oxygen Delivery Method: Circle system utilized Preoxygenation: Pre-oxygenation with 100% oxygen Induction Type: IV induction Ventilation: Mask ventilation without difficulty and Oral airway inserted - appropriate to patient size Laryngoscope Size: Miller and 3 Grade View: Grade I Tube type: Oral Tube size: 7.5 mm Number of attempts: 1 Airway Equipment and Method: Stylet and LTA kit utilized Placement Confirmation: ETT inserted through vocal cords under direct vision,  positive ETCO2 and breath sounds checked- equal and bilateral

## 2017-04-19 NOTE — Interval H&P Note (Signed)
History and Physical Interval Note:  04/19/2017 7:51 AM  Martin Cordova  has presented today for surgery, with the diagnosis of flutter  The various methods of treatment have been discussed with the patient and family. After consideration of risks, benefits and other options for treatment, the patient has consented to  Procedure(s): A-FLUTTER ABLATION (N/A) as a surgical intervention .  The patient's history has been reviewed, patient examined, no change in status, stable for surgery.  I have reviewed the patient's chart and labs.  Questions were answered to the patient's satisfaction.    Echo shows LVH  Anticipate MRI  Was in flutter as outpt  Virl Axe

## 2017-04-19 NOTE — Discharge Instructions (Signed)
No driving for 4 days. No lifting over 5 lbs for 1 week. No vigorous or sexual activity for 1 week. You may return to work on 04/26/17. Keep procedure site clean & dry. If you notice increased pain, swelling, bleeding or pus, call/return!  You may shower, but no soaking baths/hot tubs/pools for 1 week.     Venogram, Care After This sheet gives you information about how to care for yourself after your procedure. Your health care provider may also give you more specific instructions. If you have problems or questions, contact your health care provider. What can I expect after the procedure? After the procedure, it is common to have bruising and tenderness at the catheter insertion area. Follow these instructions at home: Insertion site care  Follow instructions from your health care provider about how to take care of your insertion site. Make sure you: ? Wash your hands with soap and water before you change your bandage (dressing). If soap and water are not available, use hand sanitizer. ? Change your dressing as told by your health care provider. ? Leave stitches (sutures), skin glue, or adhesive strips in place. These skin closures may need to stay in place for 2 weeks or longer. If adhesive strip edges start to loosen and curl up, you may trim the loose edges. Do not remove adhesive strips completely unless your health care provider tells you to do that.  Do not take baths, swim, or use a hot tub until your health care provider approves.  You may shower 24-48 hours after the procedure or as told by your health care provider. ? Gently wash the site with plain soap and water. ? Pat the area dry with a clean towel. ? Do not rub the site. This may cause bleeding.  Do not apply powder or lotion to the site. Keep the site clean and dry.  Check your insertion site every day for signs of infection. Check for: ? Redness, swelling, or pain. ? Fluid or blood. ? Warmth. ? Pus or a bad  smell. Activity  Rest as told by your health care provider, usually for 1-2 days.  Do not lift anything that is heavier than 10 lbs. (4.5 kg) or as told by your health care provider.  Do not drive for 24 hours if you were given a medicine to help you relax (sedative).  Do not drive or use heavy machinery while taking prescription pain medicine. General instructions  Return to your normal activities as told by your health care provider, usually in about a week. Ask your health care provider what activities are safe for you.  If the catheter site starts bleeding, lie flat and put pressure on the site. If the bleeding does not stop, get help right away. This is a medical emergency.  Drink enough fluid to keep your urine clear or pale yellow. This helps flush the contrast dye from your body.  Take over-the-counter and prescription medicines only as told by your health care provider.  Keep all follow-up visits as told by your health care provider. This is important. Contact a health care provider if:  You have a fever or chills.  You have redness, swelling, or pain around your insertion site.  You have fluid or blood coming from your insertion site.  The insertion site feels warm to the touch.  You have pus or a bad smell coming from your insertion site.  You have bruising around the insertion site.  You notice blood collecting in  the tissue around the catheter site (hematoma). The hematoma may be painful to the touch. Get help right away if:  You have severe pain at the catheter insertion area.  The catheter insertion area swells very fast.  The catheter insertion area is bleeding, and the bleeding does not stop when you hold steady pressure on the area.  The area near or just beyond the catheter insertion site becomes pale, cool, tingly, or numb. These symptoms may represent a serious problem that is an emergency. Do not wait to see if the symptoms will go away. Get medical  help right away. Call your local emergency services (911 in the [place] Do not drive yourself to the hospital. Summary  After the procedure, it is common to have bruising and tenderness at the catheter insertion area.  After the procedure, it is important to rest and drink plenty of fluids.  Do not take baths, swim, or use a hot tub until your health care provider says it is okay to do so. You may shower 24-48 hours after the procedure or as told by your health care provider.  If the catheter site starts bleeding, lie flat and put pressure on the site. If the bleeding does not stop, get help right away. This is a medical emergency. This information is not intended to replace advice given to you by your health care provider. Make sure you discuss any questions you have with your health care provider. Document Released: 12/09/2004 Document Revised: 04/27/2016 Document Reviewed: 04/27/2016 Elsevier Interactive Patient Education  2017 [place].    Cardiac Ablation Cardiac ablation is a procedure to stop some heart tissue from causing problems. The heart has many electrical connections. Sometimes these connections make the heart beat very fast or irregularly. Removing some problem areas can improve the heart rhythm or make it normal. What happens before the procedure?  Follow instructions from your doctor about what you cannot eat or drink.  Ask your doctor about: ? Changing or stopping your normal medicines. This is important if you take diabetes medicines or blood thinners. ? Taking medicines such as aspirin and ibuprofen. These medicines can thin your blood. Do not take these medicines before your procedure if your doctor tells you not to.  Plan to have someone take you home.  If you will be going home right after the procedure, plan to have someone with you for 24 hours. What happens during the procedure?  To lower your risk of infection: ? Your health care team will wash or  sanitize their hands. ? Your skin will be washed with soap. ? Hair may be removed from your neck or groin.  An IV tube will be put into one of your veins.  You will be given a medicine to help you relax (sedative).  Skin on your neck or groin will be numbed.  A cut (incision) will be made in your neck or groin.  A needle will be put through your cut and into a vein in your neck or groin.  A tube (catheter) will be put into the needle. The tube will be moved to your heart. X-rays (fluoroscopy) will be used to help guide the tube.  Small devices (electrodes) on the tip of the tube will send out electrical currents.  Dye may be put through the tube. This helps your surgeon see your heart.  Electrical energy will be used to scar (ablate) some heart tissue. Your surgeon may use: ? Heat (radiofrequency energy). ? Laser energy. ?  Extreme cold (cryoablation).  The tube will be taken out.  Pressure will be held on your cut. This helps stop bleeding.  A bandage (dressing) will be put on your cut. The procedure may vary. What happens after the procedure?  You will be monitored until your medicines have worn off.  Your cut will be watched for bleeding. You will need to lie still for a few hours.  Do not drive for 24 hours or as long as your doctor tells you. Summary  Cardiac ablation is a procedure to stop some heart tissue from causing problems.  Electrical energy will be used to scar (ablate) some heart tissue. This information is not intended to replace advice given to you by your health care provider. Make sure you discuss any questions you have with your health care provider. Document Released: 01/23/2013 Document Revised: 04/11/2016 Document Reviewed: 04/11/2016 Elsevier Interactive Patient Education  2017 [employer].

## 2017-04-19 NOTE — Transfer of Care (Signed)
Immediate Anesthesia Transfer of Care Note  Patient: Martin Cordova  Procedure(s) Performed: A-FLUTTER ABLATION (N/A )  Patient Location: PACU  Anesthesia Type:General  Level of Consciousness: awake and alert   Airway & Oxygen Therapy: Patient Spontanous Breathing and Patient connected to nasal cannula oxygen  Post-op Assessment: Report given to RN, Post -op Vital signs reviewed and stable, Patient moving all extremities and Patient moving all extremities X 4  Post vital signs: Reviewed and stable  Last Vitals:  Vitals:   04/19/17 0634 04/19/17 1022  BP: (!) 159/80   Pulse: 60   Resp: 18   Temp: 36.9 C (!) 36.4 C  SpO2: 98%     Last Pain:  Vitals:   04/19/17 0634  TempSrc: Oral         Complications: No apparent anesthesia complications

## 2017-04-19 NOTE — Progress Notes (Addendum)
Site area: Left groin a 6 and 8 french venous sheath was removed  Site Prior to Removal:  Level 0  Pressure Applied For 30 MINUTES    Bedrest Beginning at 1140a  Manual:   Yes.    Patient Status During Pull:  stable  Post Pull Groin Site:  Level 0  Post Pull Instructions Given:  Yes.    Post Pull Pulses Present:  Yes.    Dressing Applied:  Yes.    Comments:  stable

## 2017-04-19 NOTE — Addendum Note (Signed)
Addendum  created 04/19/17 1228 by Josephine Igo, CRNA   Intraprocedure Staff edited

## 2017-04-20 ENCOUNTER — Encounter (HOSPITAL_COMMUNITY): Payer: Self-pay | Admitting: Internal Medicine

## 2017-04-20 NOTE — Op Note (Signed)
NAME:  JUNAH, YAM NO.:  MEDICAL RECORD NO.:  [id number]  LOCATION:                                 FACILITY:  PHYSICIAN:  Deboraha Sprang, MD, [facility]   DATE OF BIRTH:  DATE OF PROCEDURE:  04/19/2017 DATE OF DISCHARGE:                              OPERATIVE REPORT   PREOPERATIVE DIAGNOSIS:  Atrial flutter.  POSTOPERATIVE DIAGNOSIS:  Atrial flutter.  PROCEDURES:  Invasive electrophysiological study, arrhythmia mapping, and catheter ablation.  Following obtaining informed consent, the patient was brought to the electrophysiology laboratory and placed on the fluoroscopic table in supine position.  After being submitted to general anesthesia, he underwent routine prep and drape.  Thereafter, catheterization was performed with adjunctive local anesthesia.  Following the procedure, the catheters were removed.  The sheaths were downsized and the patient was transferred to the holding area for sheath removal in stable condition.  CATHETERS: 1. A 5-French quadripolar catheter was inserted via the left femoral     vein to the AV junction. 2. A 6-French octapolar catheter was inserted via the right femoral     vein to the coronary sinus. 3. A 7-French dual decapolar catheter was inserted via the left     femoral vein to the tricuspid anulus. 4. An 8-French 10-mm deflectable tip ablation catheter was inserted     via the right femoral vein to mapping sites of the posterior septal     space.  Surface Leads:  I, aVF and V1 were monitored continuously throughout the procedure.  Following insertion of the catheters, a stimulation protocol included incremental atrial pacing.  Incremental ventricular pacing.  Single atrial extrastimuli at paced cycle length of 600 milliseconds.  END TIDAL RESULTS:  End-tidal surface electrocardiogram and intracardiac intervals. Initial: Rhythm; sinus; RR interval 1001 milliseconds; PR interval 186 milliseconds; P-wave  duration:  151 milliseconds; QRS duration:  126 milliseconds; QTc interval 455 milliseconds; AH interval 107 milliseconds; HV interval 47 milliseconds.  Right bundle branch block was present.  Final: Rhythm:  Sinus; RR interval 1164 milliseconds; PR interval 187 milliseconds; P-wave duration 118 milliseconds; QRS duration 157 milliseconds; QT interval 503 milliseconds; AH and HV were not measured.  AV nodal function: AV Wenckebach was 450 milliseconds.  VA Wenckebach was 550 milliseconds.  The AV nodal effective refractory period at 600 milliseconds and 340 milliseconds without evidence of dual AV nodal physiology.  Ventricular response programed stimulation was normal for ventricular pacing as described.  Accessory pathway function:  No evidence of any accessory pathway was identified.  RADIOFREQUENCY ENERGY:  A total of 4 minutes of RF energy was applied across the cavotricuspid isthmus.  FLUOROSCOPY TIME:  A total of 7.1 minutes of fluoroscopy time was utilized at 7.5 frames per second.  IMPRESSION: 1. Normal sinus function. 2. Abnormal atrial function manifested by previously sustained atrial     flutter.  Catheter ablation across the cavotricuspid isthmus     successfully interrupted bidirectional cavotricuspid isthmus     conduction eliminating the patient's substrate for atrial flutter. 3. Normal AV nodal function. 4. Normal His-Purkinje system function. 5. No accessory pathway. 6. Normal ventricular response programed stimulation.  In  summary, electrophysiological testing confirmed the presence of a substrate for the patient's typical atrial flutter.  Catheter ablation across the cavotricuspid isthmus successfully interrupted conduction across the isthmus and eliminated the patient's substrate.  The patient will be observed for 6 hours.  Today, he will be continued on anticoagulation for at least 4 weeks.  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     Deboraha Sprang, MD, [facility]     SCK/MEDQ  D:  04/19/2017  T:  04/19/2017  Job:  [id number]

## 2017-05-10 ENCOUNTER — Telehealth: Payer: Self-pay | Admitting: Family Medicine

## 2017-05-10 NOTE — Telephone Encounter (Signed)
Called and spoke to pt and he would like to see Dr. Dennard Schaumann - apt made for tomorrow

## 2017-05-10 NOTE — Telephone Encounter (Signed)
This would be his 3rd time in the past 3 months if this is diverticulitis I recommend he be seen first, preference is with his Gastroenterologist if possible

## 2017-05-10 NOTE — Telephone Encounter (Signed)
Patient is having another attack with his diverticulitis, per patient he said dr pickard said call when this happened and he would call him in another round of antibiotics  [phone]

## 2017-05-10 NOTE — Telephone Encounter (Signed)
OK to refill the antibxs - just sent over antibx on 03/31/17 for the same thing - see last phone note

## 2017-05-11 ENCOUNTER — Encounter: Payer: Self-pay | Admitting: Family Medicine

## 2017-05-11 ENCOUNTER — Ambulatory Visit: Payer: Medicare Other | Admitting: Family Medicine

## 2017-05-11 VITALS — BP 140/70 | HR 58 | Temp 98.0°F | Resp 16 | Ht 71.0 in | Wt 246.0 lb

## 2017-05-11 DIAGNOSIS — K5792 Diverticulitis of intestine, part unspecified, without perforation or abscess without bleeding: Secondary | ICD-10-CM

## 2017-05-11 MED ORDER — METRONIDAZOLE 500 MG PO TABS: 500.0000 mg | ORAL_TABLET | Freq: Two times a day (BID) | ORAL | 0 refills | Status: DC

## 2017-05-11 MED ORDER — CIPROFLOXACIN HCL 500 MG PO TABS: 500.0000 mg | ORAL_TABLET | Freq: Two times a day (BID) | ORAL | 0 refills | Status: DC
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
## 2017-05-17 ENCOUNTER — Encounter: Payer: Self-pay | Admitting: Internal Medicine

## 2017-05-23 ENCOUNTER — Ambulatory Visit: Payer: Medicare Other | Admitting: Internal Medicine

## 2017-05-23 ENCOUNTER — Encounter: Payer: Self-pay | Admitting: Internal Medicine

## 2017-05-23 VITALS — BP 151/74 | HR 52 | Ht 71.0 in | Wt 257.8 lb

## 2017-05-23 DIAGNOSIS — I4892 Unspecified atrial flutter: Secondary | ICD-10-CM

## 2017-05-23 DIAGNOSIS — I509 Heart failure, unspecified: Secondary | ICD-10-CM

## 2017-05-23 DIAGNOSIS — I421 Obstructive hypertrophic cardiomyopathy: Secondary | ICD-10-CM | POA: Diagnosis not present

## 2017-05-23 DIAGNOSIS — I480 Paroxysmal atrial fibrillation: Secondary | ICD-10-CM | POA: Diagnosis not present

## 2017-05-23 MED ORDER — DIAZEPAM 5 MG PO TABS: 5.0000 mg | ORAL_TABLET | Freq: Once | ORAL | 0 refills | Status: AC

## 2017-05-23 NOTE — Patient Instructions (Signed)
Medication Instructions: Your physician recommends that you continue on your current medications as directed. Please refer to the Current Medication list given to you today.  Labwork: None Ordered  Procedures/Testing: Your physician has requested that you have a cardiac MRI. Cardiac MRI uses a computer to create images of your heart as its beating, producing both still and moving pictures of your heart and major blood vessels. For further information please visit [email or web address] Please follow the instruction sheet given to you today for more information.   Follow-Up: Your physician recommends that you schedule a follow-up appointment as needed with Dr. Caryl Comes.   If you need a refill on your cardiac medications before your next appointment, please call your pharmacy.

## 2017-05-23 NOTE — Progress Notes (Signed)
Patient Care Team: Susy Frizzle, MD as PCP - General (Family Medicine)   HPI  Martin Cordova is a 76 y.o. male Seen with a chief complaint of atrial flutter for which hehas undergone flutter ablationi   He had irregular tachypalpitations about 48 hours after the procedure.  Exercise tolerance is impaired.  He has dyspnea on exertion.  He has occasional peripheral edema.  He has no nocturnal dyspnea.  He has no chest pain.  He has sleep disordered breathing but is been unable to tolerate CPAP  Thromboembolic risk factors ( age  -2, HTN-1,) for a CHADSVASc Score of 3   Records and Results Reviewed   Past Medical History:  Diagnosis Date  . Colon polyps   . Diabetes mellitus without complication ([place])   . Diverticulitis   . Diverticulosis   . GERD 12/07/2006  . HYPERGLYCEMIA 12/07/2006  . HYPERLIPIDEMIA 12/07/2006   mixed wth an atherogenic dyslipidemic pattern  . HYPERTENSION 12/07/2006  . Nephrolithiasis   . Nonalcoholic fatty liver disease   . Normal coronary arteries 04/06/2007   by cath EF 55%., last ECHO 10/13/10 EF>55% mild MR, last nuc 06/16/10 EF 57% low risk scan  . OBESITY 11/10/2009  . OSA treated with BiPAP 10/22/2007  . PAF (paroxysmal atrial fibrillation) ([place]) 04/09/2013  . Palpitations    tachy  . Pulmonary HTN ([facility])    RV syst.  40-16mmHg- moderate by echo  . Renal insufficiency    Cr to 3 on ACE  . THROMBOCYTOPENIA 12/07/2006  . TRANSAMINASES, SERUM, ELEVATED 12/07/2006  . 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 Claiborne Billings    Past Surgical History:  Procedure Laterality Date  . A-FLUTTER ABLATION N/A 04/19/2017   Procedure: A-FLUTTER ABLATION;  Surgeon: Deboraha Sprang, MD;  Location: [facility];  Service: Cardiovascular;  Laterality: N/A;  . CARDIAC CATHETERIZATION  04/06/2007   normal cors  . CARDIOVERSION N/A 08/05/2013   Procedure: CARDIOVERSION;  Surgeon: Troy Sine, MD;  Location: [facility];  Service: Cardiovascular;   Laterality: N/A;  . CARDIOVERSION N/A 12/26/2016   Procedure: CARDIOVERSION;  Surgeon: Troy Sine, MD;  Location[facility];  Service: Cardiovascular;  Laterality: N/A;  . CHOLECYSTECTOMY    . CYSTOSCOPY/RETROGRADE/URETEROSCOPY  08/19/2011   Procedure: CYSTOSCOPY/RETROGRADE/URETEROSCOPY;  Surgeon: Hanley Ben, MD;  Location[facility];  Service: Urology;  Laterality: Right;  JJ STENT PLACEMENT   . ROTATOR CUFF REPAIR Right     Current Outpatient Medications  Medication Sig Dispense Refill  . atorvastatin (LIPITOR) 10 MG tablet TAKE 1 TABLET BY MOUTH AT BEDTIME (Patient taking differently: TAKE 10 MG BY MOUTH AT BEDTIME) 30 tablet 9  . carvedilol (COREG) 25 MG tablet Take 1.5 tablets (37.5 mg total) by mouth 2 (two) times daily with a meal. 90 tablet 6  . ciprofloxacin (CIPRO) 500 MG tablet Take 1 tablet (500 mg total) by mouth 2 (two) times daily. 20 tablet 0  . diltiazem (CARDIZEM CD) 300 MG 24 hr capsule Take 1 capsule (300 mg total) by mouth daily. 90 capsule 3  . ELIQUIS 5 MG TABS tablet TAKE 1 TABLET BY MOUTH TWICE A DAY (Patient taking differently: Take 5 mg by mouth two times a day) 60 tablet 5  . metroNIDAZOLE (FLAGYL) 500 MG tablet Take 1 tablet (500 mg total) by mouth 2 (two) times daily. 20 tablet 0  . Multiple Vitamins-Minerals (CENTRUM SILVER 50+MEN) TABS Take 1 tablet by mouth daily.     Marland Kitchen  omega-3 acid ethyl esters (LOVAZA) 1 g capsule TAKE 2 CAPSULES BY MOUTH EVERY DAY (Patient taking differently: TAKE 2 G BY MOUTH EVERY DAY) 60 capsule 4  . omeprazole (PRILOSEC) 20 MG capsule Take 20 mg daily by mouth.  11  . Oxymetazoline HCl (NASAL SPRAY NA) Place 2 sprays at bedtime as needed into both nostrils (for congestion).    . potassium citrate (UROCIT-K) 10 MEQ (1080 MG) SR tablet Take 10 mEq by mouth 2 (two) times daily.     . sildenafil (VIAGRA) 100 MG tablet Take 0.5-1 tablets (50-100 mg total) by mouth daily as needed for erectile dysfunction. 5 tablet  11  . diazepam (VALIUM) 5 MG tablet Take 1 tablet (5 mg total) by mouth once for 1 dose. 1 tablet 0  . hydrochlorothiazide (HYDRODIURIL) 25 MG tablet Take 0.5 tablets (12.5 mg total) by mouth daily. OK TO TAKE EXTRA 1/2 TAB PRN SWELLING (Patient taking differently: Take 12.5 mg See admin instructions by mouth. Take 12.5 mg by mouth daily in the morning. May take an additional 12.5 mg by mouth at bedtime if needed for swelling) 30 tablet 5   No current facility-administered medications for this visit.     Allergies  Allergen Reactions  . Ibuprofen Other (See Comments)    Per the patient, he has "kidney and liver issues" and is NOT suppose to take this  . Lisinopril Swelling and Other (See Comments)    Renal insufficiency also      Review of Systems negative except from HPI and PMH  Physical Exam BP (!) 151/74   Pulse (!) 52   Ht 5\' 11"  (1.803 m)   Wt 257 lb 12.8 oz (116.9 kg)   SpO2 94%   BMI 35.96 kg/m  Well developed and nourished in no acute distress HENT normal Neck supple with JVP-flat Clear Regular rate and rhythm, no murmurs or gallops Abd-soft with active BS No Clubbing cyanosis edema Skin-warm and dry A & Oriented  Grossly normal sensory and motor function   ECG to assess rhythm demonstrated sinus rhythm at 52 Intervals 18/15/42    Assessment and  Plan  Atrial flutter status post ablation  Irregular tachypalpitations probably atrial fibrillation  Obstructive sleep apnea with therapy poorly tolerated  Hypertension    The patient has atrial flutter which is undergone ablation.  Unfortunately, he seems to be in the subgroup of patients who developed atrial fibrillation thereafter.  We have reviewed the risks and benefits of anticoagulation in the context of ongoing arrhythmias.  We discussed empiric maintaining of his anticoagulation, the use of AliveCor before daily rhythm detection, the use of prolonged 2-4-week monitoring, and the use of an implantable  loop recorder.  Their inclination is to continue with anticoagulation and I would concur.  In the event that he has recurrent tachypalpitations, I have suggested that they take half of the Cardizem capsule.  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  He will follow-up with Dr. Ellouise Newer we will see him as needed.  More than 50% of 40 min was spent in counseling related to the above        Current medicines are reviewed at length with the patient today .  The patient does not  have concerns regarding medicines.

## 2017-05-24 ENCOUNTER — Telehealth: Payer: Self-pay | Admitting: Internal Medicine

## 2017-05-24 NOTE — Telephone Encounter (Signed)
Called patient and LVM to call me back regarding what time of day he wants his MRI and also if he needs an medication for claustrophobia.

## 2017-05-25 ENCOUNTER — Telehealth: Payer: Self-pay | Admitting: *Deleted

## 2017-05-25 ENCOUNTER — Telehealth: Payer: Self-pay | Admitting: Cardiovascular Disease

## 2017-05-25 ENCOUNTER — Encounter: Payer: Self-pay | Admitting: Internal Medicine

## 2017-05-25 MED ORDER — CARVEDILOL 25 MG PO TABS: 25.0000 mg | ORAL_TABLET | Freq: Two times a day (BID) | ORAL | 6 refills | Status: DC

## 2017-05-25 NOTE — Telephone Encounter (Signed)
Returned the call to the patient concerning his heart rate in the 50's. Per Dr. Claiborne Billings, he should decrease his Carvedilol to 25 mg bid. He will call the office if his heart rate stays in the 50's and he stills feels tired. He has verbalized his understanding.

## 2017-05-25 NOTE — Telephone Encounter (Signed)
°  Pt c/o medication issue:  1. Name of Medication:  carvadalol  2. How are you currently taking this medication (dosage and times per day)? 37mg  2xday  3. Are you having a reaction (difficulty breathing--STAT)? yes  4. What is your medication issue? HR 52 tired

## 2017-05-25 NOTE — Telephone Encounter (Signed)
Incoming call from the patient. He stated that since he had his ablation on 11/21 that his heart rate has been running in the low 50's. He has been fatigued since then as well. His blood pressure has been running around 130/70's. The patient stated that he had been told previously that if his heart rate stays in the 50's then he should discontinue the Carvedilol. He is currently taking Carvedilol 37.5 mg bid. Will route to the provider for his recommendation.

## 2017-05-27 ENCOUNTER — Other Ambulatory Visit: Payer: Self-pay | Admitting: Cardiovascular Disease

## 2017-05-29 NOTE — Telephone Encounter (Signed)
REFILL 

## 2017-06-05 DIAGNOSIS — K635 Polyp of colon: Secondary | ICD-10-CM | POA: Insufficient documentation

## 2017-06-05 DIAGNOSIS — K5792 Diverticulitis of intestine, part unspecified, without perforation or abscess without bleeding: Secondary | ICD-10-CM | POA: Insufficient documentation

## 2017-06-05 DIAGNOSIS — K760 Fatty (change of) liver, not elsewhere classified: Secondary | ICD-10-CM | POA: Insufficient documentation

## 2017-06-05 DIAGNOSIS — R002 Palpitations: Secondary | ICD-10-CM | POA: Insufficient documentation

## 2017-06-05 DIAGNOSIS — E119 Type 2 diabetes mellitus without complications: Secondary | ICD-10-CM | POA: Insufficient documentation

## 2017-06-05 DIAGNOSIS — N200 Calculus of kidney: Secondary | ICD-10-CM | POA: Insufficient documentation

## 2017-06-05 DIAGNOSIS — K5790 Diverticulosis of intestine, part unspecified, without perforation or abscess without bleeding: Secondary | ICD-10-CM | POA: Insufficient documentation

## 2017-06-07 ENCOUNTER — Ambulatory Visit (AMBULATORY_SURGERY_CENTER): Payer: Self-pay

## 2017-06-07 ENCOUNTER — Ambulatory Visit (HOSPITAL_COMMUNITY): Payer: Medicare Other

## 2017-06-07 VITALS — Ht 71.0 in | Wt 250.8 lb

## 2017-06-07 DIAGNOSIS — Z86010 Personal history of colonic polyps: Secondary | ICD-10-CM

## 2017-06-07 NOTE — Progress Notes (Signed)
Per pt, no allergies to soy or egg products.Pt not taking any weight loss meds or using  O2 at home.   Pt refused Emmi video. 

## 2017-06-14 ENCOUNTER — Encounter: Payer: Self-pay | Admitting: Gastroenterology

## 2017-06-16 ENCOUNTER — Encounter: Payer: Self-pay | Admitting: Cardiovascular Disease

## 2017-06-16 ENCOUNTER — Ambulatory Visit: Payer: Medicare Other | Admitting: Cardiovascular Disease

## 2017-06-16 VITALS — BP 128/80 | HR 56 | Ht 71.0 in | Wt 247.0 lb

## 2017-06-16 DIAGNOSIS — E1122 Type 2 diabetes mellitus with diabetic chronic kidney disease: Secondary | ICD-10-CM | POA: Diagnosis not present

## 2017-06-16 DIAGNOSIS — I421 Obstructive hypertrophic cardiomyopathy: Secondary | ICD-10-CM

## 2017-06-16 DIAGNOSIS — I509 Heart failure, unspecified: Secondary | ICD-10-CM | POA: Diagnosis not present

## 2017-06-16 DIAGNOSIS — I4892 Unspecified atrial flutter: Secondary | ICD-10-CM

## 2017-06-16 DIAGNOSIS — Z7901 Long term (current) use of anticoagulants: Secondary | ICD-10-CM | POA: Diagnosis not present

## 2017-06-16 DIAGNOSIS — G4733 Obstructive sleep apnea (adult) (pediatric): Secondary | ICD-10-CM | POA: Diagnosis not present

## 2017-06-16 DIAGNOSIS — I480 Paroxysmal atrial fibrillation: Secondary | ICD-10-CM

## 2017-06-16 DIAGNOSIS — I517 Cardiomegaly: Secondary | ICD-10-CM | POA: Diagnosis not present

## 2017-06-16 MED ORDER — CARVEDILOL 25 MG PO TABS: 25.0000 mg | ORAL_TABLET | Freq: Two times a day (BID) | ORAL | 12 refills | Status: DC

## 2017-06-16 NOTE — Patient Instructions (Signed)
Medication Instructions:  Your physician recommends that you continue on your current medications as directed. Please refer to the Current Medication list given to you today.  Testing/Procedures: Your physician has requested that you have a cardiac MRI. Cardiac MRI uses a computer to create images of your heart as its beating, producing both still and moving pictures of your heart and major blood vessels. For further information please visit [email or web address] Please follow the instruction sheet given to you today for more information. --reschedule this at your convienence  Follow-Up: Your physician recommends that you schedule a follow-up appointment in: 8-10 weeks with Dr. Claiborne Billings (sleep clinic)   Any Other Special Instructions Will Be Listed Below (If Applicable).     If you need a refill on your cardiac medications before your next appointment, please call your pharmacy.

## 2017-06-16 NOTE — Progress Notes (Signed)
Patient ID: Martin Cordova, male   DOB: [birth year], 77 y.o.   MRN: [id number]      HPI: Martin Cordova is a 77 y.o. male is who presents to the office today for a 3 month follow-up cardiology evaluation.  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 In the past, he developed an obstructive uropathy attributed to kidney stones; creatinine had risen up to 3 and ultimately improved and stabilized at approximately 1.7. Martin Cordova uses his BiPAP daily and does note good sleep.  In the past he had issues with mild peripheral edema, which ultimately improved.  He had been previously taken off his lisinopril when his creatinine had risen in the setting of his obstructive uropathy.  In 2015 he developed recurrent episodes of palpitations and was found to have recurrent paroxysmal atrial fibrillation/flutter.  His medications were adjusted and  he was started on antiarrhythmic therapy with Rythmol to take in addition to his increasing doses of carvedilol.  He was started on Eliquis for anticoagulation.  He underwent successful cardioversion on 08/05/2013.  Since that time, he is unaware of any recurrent atrial fibrillation.  He has noticed more energy. He had been maintained on 50 mg twice a day of carvedilol in addition to Rythmol 225 mg every 8 hours, but due to slow pulse rates, these doses have ultimately been reduced.  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 He has been on CPAP therapy initially for approximately 8 years and has been on BiPAP auto SV for over 4 years. He admits to using his BiPAP with 100% compliance.  He obtained a new BiPAP machine in June.  He feels that his machine is significantly  improved from his prior one. When I last saw him I reviewed his  download of his new BiPAP auto SV machine indicates that 90% of the time is EPAP pressure was 17 and 90% of the time his pressure support was 5.8, giving an IPAP of 21.8 cm.  He is using it 100% of the time and is averaging 5 hours and 49 minutes on his most recent download from 12/07/2013 through 01/05/2014.  His average AHI was 7.5.  His device setting maximum BiPAP pressure is 25 cm in maximum EPAP pressure 21 cm.  His average central event index is 1.3, and average obstructive apneic index 0.2, with an average copy index of 6.0.  Average breath.  Rate is 18 breaths per minute with a minute ventilation of 12.1.  Average total body may 670 mL.  When I saw him on 10/21/15 he was unaware of any arrhythmia.  He was on carvedilol 12.5 mg twice a day, Rythmol 225 mg twice a day, hydralazine 25 mg twice a day and eliquis  5 mg twice a day.  He also has been taking omeprazole for GERD and atorvastatin 10 mg for hyperlipidemia. He was found to have recurrent atrial flutter with variable block.  At that time, I recommended that he increase his Rythmol and take 225 mg every 8 hours and further increased his carvedilol to 18.75 mg twice a day.  Laboratory had revealed a magnesium of 2.0.  He has stage III chronic kidney disease and  his BUN was 26, creatinine 1.71 with a GFR estimate of 39.  Potassium was 4.8.  TSH was normal at 2.4.  Insomnia.  One week later in follow-up he had reverted back to sinus rhythm..  When I saw him in September 2017, he was maintaining sinus rhythm.  Since then, he admits to weight gain.  In early April, he began to notice some increasing shortness of breath and elevated heart rate and fatigue.  He was seen by [name] on 09/07/2016 and was found to be back in atrial flutter with a ventricular rate at 90.  He has been on eloquence 5 g twice a day for anticoagulation and was on carvedilol 18.75 twice a day.  Carvedilol dose was  increased to 25 mg twice a day.  He was also told at that time to try increasing his Propofenone to 300 mg 3 times a day.  He took this for several days but did not tolerate the increased dose and reduced it back to its previous dose of 225 mg every 8 hours.  When I saw him on 10/20/2016  I increased carvedilol to 37.5 mg twice a day.  He has continued to use his BiPAP therapy for sleep apnea.    When I saw him in June 2018 he was still in atrial flutter with 3:1 block.  I discontinued hydralazine and started him on Cardizem CD 180 mg, both for blood pressure and potential antiarrhythmic therapy. He also was having ankle edema and I started him on low-dose hydrochlorothiazide.  His peripheral edema significantly improved.  He has felt better.  He denied episodes of chest tightness.  He underwent successful outpatient DC cardioversion by me on 12/26/2016.  He successfully cardioverted at 120 joules.  When he returned for a follow-up evaluation on 01/12/2017 and saw  [name], [facility] .  He was back in atrial flutter.  At that time his propafenone was discontinued.  Over the past several months, Martin Cordova has felt well.  At times there is some trace swelling in his ankles.  He continues to use his BiPAP with 100% compliance.  He was hospitalized on September 19 with an episode of diverticulitis and was discharged on 02/24/2017.   When I last saw him in October 2018, he was back in atrial flutter.  At that time, I recommended consideration for atrial flutter ablation.  He was evaluated by Dr. Caryl Comes and on 04/19/2017 underwent successful atrial flutter ablation across the caval tricuspid isthmus, which interrupted bidirectional conduction.  Initial plan was for him to continue anticoagulation for 4 weeks, but unfortunately he developed irregular tachycardia palpitations about 48 hours after the procedure.  He's been found to have paroxysmal atrial fibrillation.  He as result is on chronic anticoagulation therapy.   In November 2018.  An echo Doppler study showed an EF of 40-45%.  He has been on carvedilol 25 mg twice a day and takes an extra carvedilol as needed.  He has had recurrent short burst of atrial fibrillation, but had an episode for almost an entire day.  He received a new BiPAP machine after his previous machine completely malfunction.  He admits to 100% compliance.  [facility] is his DME company.  He has diabetes mellitus and was recently started back on Tradjenta for hemoglobin A1c of 7.  He presents for evaluation.  Past Medical History  Diagnosis Date  . GERD 12/07/2006  . HYPERGLYCEMIA 12/07/2006  . HYPERLIPIDEMIA 12/07/2006    mixed wth an  atherogenic dyslipidemic pattern  . HYPERTENSION 12/07/2006  . OBESITY 11/10/2009  . THROMBOCYTOPENIA 12/07/2006  . TRANSAMINASES, SERUM, ELEVATED 12/07/2006  . SLEEP APNEA 10/22/2007    uses bipap  . 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 Claiborne Billings  . Diabetes mellitus without complication   . Renal insufficiency     Cr to 3 on ACE  . Nephrolithiasis   . Normal coronary arteries 04/06/2007    by cath EF 55%., last ECHO 10/13/10 EF>55% mild MR, last nuc 06/16/10 EF 57% low risk scan  . Pulmonary HTN     RV syst.  40-13mHg- moderate by echo  . Palpitations     tachy  . PAF (paroxysmal atrial fibrillation) 04/09/2013    Past Surgical History  Procedure Laterality Date  . Cholecystectomy    . Rotator cuff repair    . Cystoscopy/retrograde/ureteroscopy  08/19/2011    Procedure: CYSTOSCOPY/RETROGRADE/URETEROSCOPY;  Surgeon: MHanley Ben MD;  Location: [facility]  Service: Urology;  Laterality: Right;  JJ STENT PLACEMENT   . Cardiac catheterization  04/06/2007    normal cors    Allergies  Allergen Reactions  . Lisinopril     Renal insufficiency    Current Outpatient Medications:  .  atorvastatin (LIPITOR) 10 MG tablet, TAKE 1 TABLET BY MOUTH AT BEDTIME (Patient taking differently: TAKE 10 MG BY MOUTH AT  BEDTIME), Disp: 30 tablet, Rfl: 9 .  bisacodyl (DULCOLAX) 5 MG EC tablet, Take 5 mg by mouth. Dulcolax 5 mg bowel prep #4-Take as directed, Disp: , Rfl:  .  carvedilol (COREG) 25 MG tablet, Take 1 tablet (25 mg total) by mouth 2 (two) times daily with a meal., Disp: 90 tablet, Rfl: 12 .  diltiazem (CARDIZEM CD) 300 MG 24 hr capsule, Take 1 capsule (300 mg total) by mouth daily., Disp: 90 capsule, Rfl: 3 .  ELIQUIS 5 MG TABS tablet, TAKE 1 TABLET BY MOUTH TWICE A DAY (Patient taking differently: Take 5 mg by mouth two times a day), Disp: 60 tablet, Rfl: 5 .  hydrochlorothiazide (HYDRODIURIL) 25 MG tablet, Take 25 mg by mouth daily. Take 1/2 daily, Disp: , Rfl:  .  linagliptin (TRADJENTA) 5 MG TABS tablet, Take 5 mg by mouth daily., Disp: , Rfl:  .  Multiple Vitamins-Minerals (CENTRUM SILVER 50+MEN) TABS, Take 1 tablet by mouth daily. , Disp: , Rfl:  .  omega-3 acid ethyl esters (LOVAZA) 1 g capsule, TAKE 2 CAPSULES BY MOUTH EVERY DAY (Patient taking differently: TAKE 2 G BY MOUTH EVERY DAY), Disp: 60 capsule, Rfl: 4 .  omeprazole (PRILOSEC) 20 MG capsule, Take 20 mg daily by mouth., Disp: , Rfl: 11 .  Oxymetazoline HCl (NASAL SPRAY NA), Place 2 sprays at bedtime as needed into both nostrils (for congestion)., Disp: , Rfl:  .  Polyethylene Glycol 3350 (MIRALAX PO), Take by mouth. Miralax 238 gm bowel prep-Take as directed, Disp: , Rfl:  .  potassium citrate (UROCIT-K) 10 MEQ (1080 MG) SR tablet, Take 10 mEq by mouth 2 (two) times daily. , Disp: , Rfl:  .  sildenafil (VIAGRA) 100 MG tablet, Take 0.5-1 tablets (50-100 mg total) by mouth daily as needed for erectile dysfunction., Disp: 5 tablet, Rfl: 11 .  hydrochlorothiazide (HYDRODIURIL) 25 MG tablet, Take 0.5 tablets (12.5 mg total) by mouth daily. OK TO TAKE EXTRA 1/2 TAB PRN SWELLING (Patient taking differently: Take 12.5 mg See admin instructions by mouth. Take 12.5 mg by mouth daily in the morning. May take an additional 12.5  mg by mouth at bedtime  if needed for swelling), Disp: 30 tablet, Rfl: 5  Socially he is married has 4 children and 5 grandchildren. He is a distant relative to the "Benecke brothers." He does try to walk and exercise. There is no tobacco use. He does drink occasional alcohol.   ROS General: Negative; No fevers, chills, or night sweats;  HEENT: Negative; No changes in vision or hearing, sinus congestion, difficulty swallowing Pulmonary: Negative; No cough, wheezing, shortness of breath, hemoptysis Cardiovascular: See history of present illness No recent peripheral edema GI: Status post recent episode of diverticulitis requiring 2 day hospitalization GU:  No dysuria, hematuria, or difficulty voiding; some difficulty with erectile function Musculoskeletal: Negative; no myalgias, joint pain, or weakness Hematologic/Oncology: Negative; no easy bruising, bleeding Endocrine: Negative; no heat/cold intolerance; no diabetes Neuro: Negative; no changes in balance, headaches Skin: Negative; No rashes or skin lesions Psychiatric: Negative; No behavioral problems, depression Sleep: He is using his BiPAP therapy with 100% compliance.  No snoring, daytime sleepiness, hypersomnolence, bruxism, restless legs, hypnogognic hallucinations, no cataplexy Other comprehensive 14 point system review is negative.  PE BP 128/80   Pulse (!) 56   Ht '5\' 11"'$  (1.803 m)   Wt 247 lb (112 kg)   BMI 34.45 kg/m    Repeat blood pressure by me was 126/82  Wt Readings from Last 3 Encounters:  06/16/17 247 lb (112 kg)  06/07/17 250 lb 12.8 oz (113.8 kg)  05/23/17 257 lb 12.8 oz (116.9 kg)   General: Alert, oriented, no distress.  Skin: normal turgor, no rashes, warm and dry HEENT: Normocephalic, atraumatic. Pupils equal round and reactive to light; sclera anicteric; extraocular muscles intact; Nose without nasal septal hypertrophy Mouth/Parynx benign; Mallinpatti scale 3 Neck: No JVD, no carotid bruits; normal carotid upstroke Lungs:  clear to ausculatation and percussion; no wheezing or rales Chest wall: without tenderness to palpitation Heart: PMI not displaced, RRR, s1 s2 normal, 1/6 systolic murmur, no diastolic murmur, no rubs, gallops, thrills, or heaves Abdomen: soft, nontender; no hepatosplenomehaly, BS+; abdominal aorta nontender and not dilated by palpation. Back: no CVA tenderness Pulses 2+ Musculoskeletal: full range of motion, normal strength, no joint deformities Extremities: no clubbing cyanosis or edema, Homan's sign negative  Neurologic: grossly nonfocal; Cranial nerves grossly wnl Psychologic: Normal mood and affect   ECG (independently read by me): Sinus bradycardia 56 bpm.  Possible left atrial enlargement.  Right bundle branch block with repolarization changes.  PR interval 186 ms; QTc interval 436 ms  October 2018 ECG (independently read by me): Atrial flutter with variable block at 96 bpm.  Right bundle-branch block with repolarization changes.  QTc interval 437 ms  July 2018 ECG (independently read by me): Probable atrial flutter with a ventricular rate at 82 bpm.  Right bundle-branch block, left anterior hemiblock.  11/08/2016 ECG (independently read by me): Probable atrial flutter 3:1 block , ventricular rate at 85;  right bundle branch block with repolarization changes.  QTc interval 528 ms.   May 2018 ECG (independently read by me): atrial flutter with variable block with ventricular rate at 89 bpm.  Right bundle-branch block, left anterior hemiblock.  September 2017 ECG (independently read by me): Normal sinus rhythm at 63 bpm.  First-degree AV block with a PR interval of 208 ms.  Right bundle branch block with repolarization changes.  QTc interval 466 ms.  10/28/15 ECG (independently read by me): Sinus bradycardia at 54 bpm.  First degree block with PR interval 222 ms.  Right bundle-branch block.  10/21/2015 ECG (independently read by me): Atrial flutter with variable block.  Right  bundle-branch block with repolarization changes.  October 2016 ECG (independently read by me): sinus rhythm with first-degree AV block with a PR interval at 216 ms.  Ventricular rate 86 bpm with occasional PVCs.    May 2016 ECG (independently read by me): Normal sinus rhythm at 60 bpm.  Right bundle-branch block with repolarization changes.  November 2015 ECG (independently read by me): Normal sinus rhythm at 61.  Nonspecific T abnormality.  QTc interval 394 ms.  11/18/2013 ECG (independently read by me): Sinus bradycardia 58 beats per minute.  PR interval 198 ms; QTc interval 371 ms.  Nonspecific ST changes.  07/29/2013 ECG  (independently read by me): Atrial flutter with 2:1 block with a ventricular rate of 87 beats per minute. Atrial rate is approximately 360 ms. 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 fib flutter now with right bundle branch block and repolarization changes.  Prior ECG of 06/04/2013: EKG  suggests probable atrial flutter with a ventricular rate of 105 beats per minute. There also are frequent PVCs. QTc interval is 436 ms. They're nonspecific T changes.  LABS: BMP Latest Ref Rng & Units 04/10/2017 02/28/2017 02/24/2017  Glucose 65 - 99 mg/dL 192(H) 144(H) 112(H)  BUN 8 - 27 mg/dL 24 29(H) 26(H)  Creatinine 0.76 - 1.27 mg/dL 1.71(H) 1.83(H) 2.02(H)  BUN/Creat Ratio 10 - '24 14 16 '$ -  Sodium 134 - 144 mmol/L 139 140 136  Potassium 3.5 - 5.2 mmol/L 3.8 4.9 3.7  Chloride 96 - 106 mmol/L 103 104 105  CO2 20 - 29 mmol/L '24 24 25  '$ Calcium 8.6 - 10.2 mg/dL 9.3 9.5 8.6(L)   Hepatic Function Latest Ref Rng & Units 02/22/2017 10/25/2016 05/17/2016  Total Protein 6.5 - 8.1 g/dL 6.6 6.8 7.3  Albumin 3.5 - 5.0 g/dL 3.4(L) 4.3 3.4(L)  AST 15 - 41 U/L 35 19 22  ALT 17 - 63 U/L 42 25 26  Alk Phosphatase 38 - 126 U/L 44 39(L) 51  Total Bilirubin 0.3 - 1.2 mg/dL 2.2(H) 0.8 0.56  Bilirubin, Direct 0.0 - 0.3 mg/dL - - -     CBC Latest Ref Rng & Units 04/10/2017 02/28/2017 02/24/2017  WBC 3.4 - 10.8 x10E3/uL 5.2 7.1 4.1  Hemoglobin 13.0 - 17.7 g/dL 13.5 14.2 12.4(L)  Hematocrit 37.5 - 51.0 % 41.1 41.7 37.0(L)  Platelets 150 - 379 x10E3/uL 145(L) 159 62(L)   Lab Results  Component Value Date   TSH 2.40 10/26/2015   Lipid Panel     Component Value Date/Time   CHOL 110 10/25/2016 0853   TRIG 185 (H) 10/25/2016 0853   HDL 32 (L) 10/25/2016 0853   CHOLHDL 3.4 10/25/2016 0853   VLDL 37 (H) 10/25/2016 0853   LDLCALC 41 10/25/2016 0853   LDLDIRECT 65.0 04/12/2013 0825     RADIOLOGY: No results found.  IMPRESSION:  1. PAF (paroxysmal atrial fibrillation) ([place])   2. 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 Atrial flutter, unspecified type ([place]) status post atrial flutter ablation 04/19/2017   4. OSA (obstructive sleep apnea) on  BiPAPBiPAP   5. Left ventricular hypertrophy   6. Type 2 diabetes mellitus with chronic kidney disease, without long-term current use of insulin, unspecified CKD stage ([place])   7. Anticoagulation adequate   8. Congestive heart failure, unspecified HF chronicity, unspecified heart failure type [facility])     ASSESSMENT  AND PLAN: Mr. Bridgewater is a 77 year old Caucasian male who has documented normal coronary arteries by heart catheterization in 2008. He has a history of hypertension, mixed hyperlipidemia, and has paroxysmal atrial fibrillation/flutter. He has a history of diabetes mellitus and after he had lost a significant amount of weight and with renal insufficiency he was taken off his diabetic medication.  Subsequent, he has gained a majority of his weight back.   Due to recurrent episodes of paroxysmal atrial flutter.  He underwent successful catheter ablation in November 2018.  However, he has experienced recurrent episodes of short-lived paroxysmal atrial fibrillation following his atrial flutter ablation.  We discussed the need for long-term anticoagulation and he  continues to be on eliquis 5 mg twice a day without bleeding.  Presently, his blood pressure is controlled.  His most recent echo Doppler study showed an EF of 40-45%.  He was recently started on Tradjenta by Dr. Buddy Duty.  I discussed with him that he may be a good candidate for Jardiance in light of his EF of 4045% and significant dated with reference to reduction in CHF. I will not institute therapy .  I recommended that he discuss this with Dr. Buddy Duty at his next endocrinologic evaluation.  He continues to use BiPAP with 100% compliance.  He now has a new machine, which he just received yesterday.  I will need to obtain a download within 90 days of his new machine for objective evidence of compliance.  Patient also tells me that Dr. Caryl Comes had recommended that he undergo a cardiac MRI.  He had canceled the study due to the holiday season.  We will reschedule this for further evaluation of severe focal basal and moderate concentric hypertrophy.   Time spent 30 minutes Shelva Majestic, MD  06/18/2017  9:38 AM

## 2017-06-18 ENCOUNTER — Encounter: Payer: Self-pay | Admitting: Cardiovascular Disease

## 2017-06-19 ENCOUNTER — Encounter: Payer: Self-pay | Admitting: Cardiovascular Disease

## 2017-06-20 ENCOUNTER — Ambulatory Visit (AMBULATORY_SURGERY_CENTER): Payer: Medicare Other | Admitting: Gastroenterology

## 2017-06-20 ENCOUNTER — Encounter: Payer: Self-pay | Admitting: Gastroenterology

## 2017-06-20 ENCOUNTER — Other Ambulatory Visit: Payer: Self-pay

## 2017-06-20 VITALS — BP 106/67 | HR 55 | Temp 97.3°F | Resp 17 | Ht 71.0 in | Wt 250.0 lb

## 2017-06-20 DIAGNOSIS — D125 Benign neoplasm of sigmoid colon: Secondary | ICD-10-CM | POA: Diagnosis not present

## 2017-06-20 DIAGNOSIS — Z86010 Personal history of colonic polyps: Secondary | ICD-10-CM

## 2017-06-20 DIAGNOSIS — K635 Polyp of colon: Secondary | ICD-10-CM

## 2017-06-20 DIAGNOSIS — D124 Benign neoplasm of descending colon: Secondary | ICD-10-CM | POA: Diagnosis not present

## 2017-06-20 MED ORDER — SODIUM CHLORIDE 0.9 % IV SOLN: 500.0000 mL | Freq: Once | INTRAVENOUS | Status: DC

## 2017-06-20 NOTE — Progress Notes (Signed)
Called to room to assist during endoscopic procedure.  Patient ID and intended procedure confirmed with present staff. Received instructions for my participation in the procedure from the performing physician.  

## 2017-06-20 NOTE — Patient Instructions (Signed)
YOU HAD AN ENDOSCOPIC PROCEDURE TODAY AT [facility]:   Refer to the procedure report that was given to you for any specific questions about what was found during the examination.  If the procedure report does not answer your questions, please call your gastroenterologist to clarify.  If you requested that your care partner not be given the details of your procedure findings, then the procedure report has been included in a sealed envelope for you to review at your convenience later.  YOU SHOULD EXPECT: Some feelings of bloating in the abdomen. Passage of more gas than usual.  Walking can help get rid of the air that was put into your GI tract during the procedure and reduce the bloating. If you had a lower endoscopy (such as a colonoscopy or flexible sigmoidoscopy) you may notice spotting of blood in your stool or on the toilet paper. If you underwent a bowel prep for your procedure, you may not have a normal bowel movement for a few days.  Please Note:  You might notice some irritation and congestion in your nose or some drainage.  This is from the oxygen used during your procedure.  There is no need for concern and it should clear up in a day or so.  SYMPTOMS TO REPORT IMMEDIATELY:   Following lower endoscopy (colonoscopy or flexible sigmoidoscopy):  Excessive amounts of blood in the stool  Significant tenderness or worsening of abdominal pains  Swelling of the abdomen that is new, acute  Fever of 100F or higher   For urgent or emergent issues, a gastroenterologist can be reached at any hour by calling [phone].   DIET:  We do recommend a small meal at first, but then you may proceed to your regular diet.  Drink plenty of fluids but you should avoid alcoholic beverages for 24 hours.  ACTIVITY:  You should plan to take it easy for the rest of today and you should NOT DRIVE or use heavy machinery until tomorrow (because of the sedation medicines used during the test).     FOLLOW UP: Our staff will call the number listed on your records the next business day following your procedure to check on you and address any questions or concerns that you may have regarding the information given to you following your procedure. If we do not reach you, we will leave a message.  However, if you are feeling well and you are not experiencing any problems, there is no need to return our call.  We will assume that you have returned to your regular daily activities without incident.  If any biopsies were taken you will be contacted by phone or by letter within the next 1-3 weeks.  Please call us at [phone] if you have not heard about the biopsies in 3 weeks.    SIGNATURES/CONFIDENTIALITY: You and/or your care partner have signed paperwork which will be entered into your electronic medical record.  These signatures attest to the fact that that the information above on your After Visit Summary has been reviewed and is understood.  Full responsibility of the confidentiality of this discharge information lies with you and/or your care-partner.    Handouts were given to your care partner on polyps, diverticulosis, and  Hemorrhoids. Restart your [facility] Thursday morning.  Per Dr. Havery Moros okay to take aspirin 325 mg  until that time. No other aspirin products,  ibuprofen, naproxen, advil, motrin, aleve, or other non-steroidal anti-inflammatory drugs for 2 weeks after polyp removal.  Your blood sugar was 101 in the recovery room. You may resume your current medications today. Await biopsy results. Please call if any questions or concerns.

## 2017-06-20 NOTE — Progress Notes (Signed)
Spontaneous respirations throughout. VSS. Resting comfortably. To PACU on room air. Report to  RN. 

## 2017-06-20 NOTE — Op Note (Signed)
[facility] Patient Name: Martin Cordova Procedure Date: 06/20/2017 9:41 AM MRN: [id number] Endoscopist: Remo Lipps P. Armbruster MD, MD Age: 77 Referring MD:  Date of Birth: September 12, 1940 Gender: Male Account #: [id number] Procedure:                Colonoscopy Indications:              Surveillance: Personal history of adenomatous                            polyps on last colonoscopy 3 years ago, also with                            reported history of sigmoid diverticulitis in                            September Medicines:                Monitored Anesthesia Care Procedure:                Pre-Anesthesia Assessment:                           - Prior to the procedure, a History and Physical                            was performed, and patient medications and                            allergies were reviewed. The patient's tolerance of                            previous anesthesia was also reviewed. The risks                            and benefits of the procedure and the sedation                            options and risks were discussed with the patient.                            All questions were answered, and informed consent                            was obtained. Prior Anticoagulants: The patient has                            taken no previous anticoagulant or antiplatelet                            agents. ASA Grade Assessment: II - A patient with                            mild systemic disease. After reviewing the risks  and benefits, the patient was deemed in                            satisfactory condition to undergo the procedure.                           After obtaining informed consent, the colonoscope                            was passed under direct vision. Throughout the                            procedure, the patient's blood pressure, pulse, and                            oxygen saturations were monitored continuously. The                             Colonoscope was introduced through the anus and                            advanced to the the cecum, identified by                            appendiceal orifice and ileocecal valve. The                            colonoscopy was performed without difficulty. The                            patient tolerated the procedure well. The quality                            of the bowel preparation was adequate. The                            ileocecal valve, appendiceal orifice, and rectum                            were photographed. Scope In: 9:43:45 AM Scope Out: 10:04:25 AM Scope Withdrawal Time: 0 hours 18 minutes 25 seconds  Total Procedure Duration: 0 hours 20 minutes 40 seconds  Findings:                 The perianal and digital rectal examinations were                            normal.                           Two sessile polyps were found in the descending                            colon. The polyps were 3 to 5 mm in size. These  polyps were removed with a cold snare. Resection                            and retrieval were complete.                           Three sessile polyps were found in the sigmoid                            colon. The polyps were 3 to 5 mm in size. These                            polyps were removed with a cold snare. Resection                            and retrieval were complete.                           Many small and large-mouthed diverticula were found                            in the entire colon, highest burden in the sigmoid                            colon (severe diverticulosis), mild in the right                            and transverse                           Internal hemorrhoids were found during retroflexion.                           The exam was otherwise without abnormality. Complications:            No immediate complications. Estimated blood loss:                             Minimal. Estimated Blood Loss:     Estimated blood loss was minimal. Impression:               - Two 3 to 5 mm polyps in the descending colon,                            removed with a cold snare. Resected and retrieved.                           - Three 3 to 5 mm polyps in the sigmoid colon,                            removed with a cold snare. Resected and retrieved.                           - Diverticulosis in the entire examined colon,  highest burden in sigmoid colon.                           - Internal hemorrhoids.                           - The examination was otherwise normal. Recommendation:           - Patient has a contact number available for                            emergencies. The signs and symptoms of potential                            delayed complications were discussed with the                            patient. Return to normal activities tomorrow.                            Written discharge instructions were provided to the                            patient.                           - Resume previous diet.                           - Continue present medications.                           - Resume Eliquis Thursday morning (okay to take                            aspirin until that time)                           - Await pathology results.                           - Repeat colonoscopy is recommended for                            surveillance. The colonoscopy date will be                            determined after pathology results from today's                            exam become available for review.                           - No ibuprofen, naproxen, or other non-steroidal                            anti-inflammatory drugs for 2 weeks after polyp  removal. Remo Lipps P. Armbruster MD, MD 06/20/2017 10:11:16 AM This report has been signed electronically.

## 2017-06-20 NOTE — Progress Notes (Signed)
No problems noted in the recovery room. maw 

## 2017-06-20 NOTE — Progress Notes (Signed)
No changes in medical or surgical hx since PV per pt.  No egg or soy allergy

## 2017-06-20 NOTE — Progress Notes (Signed)
No problems noted in the recovery room. Maw  Per Dr. Havery Moros pt should take aspirin 325 mg daily today and tomorrow then restart Eliquis thursday am.  Corky Sing

## 2017-06-21 ENCOUNTER — Telehealth: Payer: Self-pay | Admitting: *Deleted

## 2017-06-21 NOTE — Telephone Encounter (Signed)
  Follow up Call-  Call back number 06/20/2017  Post procedure Call Back phone  # [phone]  Permission to leave phone message Yes  Some recent data might be hidden     Patient questions:  Do you have a fever, pain , or abdominal swelling? No. Pain Score  0 *  Have you tolerated food without any problems? Yes.    Have you been able to return to your normal activities? Yes.    Do you have any questions about your discharge instructions: Diet   No. Medications  No. Follow up visit  No.  Do you have questions or concerns about your Care? No.  Actions: * If pain score is 4 or above: No action needed, pain <4.

## 2017-06-27 ENCOUNTER — Encounter: Payer: Self-pay | Admitting: Gastroenterology

## 2017-06-28 ENCOUNTER — Other Ambulatory Visit: Payer: Self-pay | Admitting: Cardiovascular Disease

## 2017-07-03 ENCOUNTER — Other Ambulatory Visit (HOSPITAL_COMMUNITY): Payer: Medicare Other

## 2017-07-21 ENCOUNTER — Ambulatory Visit (HOSPITAL_COMMUNITY)
Admission: RE | Admit: 2017-07-21 | Discharge: 2017-07-21 | Disposition: A | Discharge status: Home / Self Care | Payer: Medicare Other | Source: Ambulatory Visit | Attending: Cardiovascular Disease | Admitting: Cardiovascular Disease

## 2017-07-21 ENCOUNTER — Other Ambulatory Visit: Payer: Self-pay | Admitting: Cardiovascular Disease

## 2017-07-21 DIAGNOSIS — I421 Obstructive hypertrophic cardiomyopathy: Secondary | ICD-10-CM

## 2017-07-21 DIAGNOSIS — I517 Cardiomegaly: Secondary | ICD-10-CM | POA: Insufficient documentation

## 2017-07-21 LAB — CREATININE, SERUM
Creatinine, Ser: 1.78 mg/dL — ABNORMAL HIGH (ref 0.61–1.24)
GFR calc Af Amer: 42 mL/min — ABNORMAL LOW (ref >60)
GFR calc non Af Amer: 36 mL/min — ABNORMAL LOW (ref >60)

## 2017-07-21 MED ORDER — GADOBENATE DIMEGLUMINE 529 MG/ML IV SOLN
34.0000 mL | Freq: Once | INTRAVENOUS | Status: AC | PRN
  Administered 2017-07-21: 34 mL via INTRAVENOUS

## 2017-08-06 ENCOUNTER — Other Ambulatory Visit: Payer: Self-pay | Admitting: Family Medicine

## 2017-08-06 ENCOUNTER — Other Ambulatory Visit: Payer: Self-pay | Admitting: Cardiovascular Disease

## 2017-08-08 ENCOUNTER — Other Ambulatory Visit: Payer: Self-pay | Admitting: Nephrology

## 2017-08-08 DIAGNOSIS — N183 Chronic kidney disease, stage 3 unspecified: Secondary | ICD-10-CM

## 2017-08-09 ENCOUNTER — Ambulatory Visit
Admission: RE | Admit: 2017-08-09 | Discharge: 2017-08-09 | Disposition: A | Discharge status: Home / Self Care | Payer: Medicare Other | Source: Ambulatory Visit | Attending: Nephrology | Admitting: Nephrology

## 2017-08-09 DIAGNOSIS — N183 Chronic kidney disease, stage 3 unspecified: Secondary | ICD-10-CM

## 2017-08-31 ENCOUNTER — Other Ambulatory Visit: Payer: Self-pay | Admitting: Cardiovascular Disease

## 2017-09-01 NOTE — Telephone Encounter (Signed)
REFILL 

## 2017-09-21 ENCOUNTER — Telehealth: Payer: Self-pay | Admitting: Hematology

## 2017-09-21 NOTE — Telephone Encounter (Signed)
Left message for patient regarding appointment date/time/location & phone number. Referring office notified also

## 2017-10-03 ENCOUNTER — Encounter: Payer: Self-pay | Admitting: Cardiovascular Disease

## 2017-10-03 ENCOUNTER — Encounter

## 2017-10-03 ENCOUNTER — Ambulatory Visit: Payer: Medicare Other | Admitting: Cardiovascular Disease

## 2017-10-03 VITALS — BP 143/83 | HR 50 | Ht 71.0 in | Wt 236.6 lb

## 2017-10-03 DIAGNOSIS — I517 Cardiomegaly: Secondary | ICD-10-CM

## 2017-10-03 DIAGNOSIS — I480 Paroxysmal atrial fibrillation: Secondary | ICD-10-CM | POA: Diagnosis not present

## 2017-10-03 DIAGNOSIS — G4733 Obstructive sleep apnea (adult) (pediatric): Secondary | ICD-10-CM | POA: Diagnosis not present

## 2017-10-03 DIAGNOSIS — Z7901 Long term (current) use of anticoagulants: Secondary | ICD-10-CM | POA: Diagnosis not present

## 2017-10-03 DIAGNOSIS — I10 Essential (primary) hypertension: Secondary | ICD-10-CM | POA: Diagnosis not present

## 2017-10-03 DIAGNOSIS — E1122 Type 2 diabetes mellitus with diabetic chronic kidney disease: Secondary | ICD-10-CM

## 2017-10-03 MED ORDER — DILTIAZEM HCL ER COATED BEADS 240 MG PO CP24: 240.0000 mg | ORAL_CAPSULE | Freq: Every day | ORAL | 3 refills | Status: DC

## 2017-10-03 MED ORDER — HYDROCHLOROTHIAZIDE 25 MG PO TABS: 25.0000 mg | ORAL_TABLET | Freq: Every day | ORAL | 4 refills | Status: DC

## 2017-10-03 NOTE — Progress Notes (Signed)
Patient ID: KELTEN ENOCHS, male   DOB: 24-Jul-1940, 77 y.o.   MRN: [id number]      HPI: Glover Capano Spatafore is a 77 y.o. male is who presents to the office today for a 3 month follow-up cardiology/sleep clinic evaluation.  Mr. 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 In the past, he developed an obstructive uropathy attributed to kidney stones; creatinine had risen up to 3 and ultimately improved and stabilized at approximately 1.7. Mr. Yang uses his BiPAP daily and does note good sleep.  In the past he had issues with mild peripheral edema, which ultimately improved.  He had been previously taken off his lisinopril when his creatinine had risen in the setting of his obstructive uropathy.  In 2015 he developed recurrent episodes of palpitations and was found to have recurrent paroxysmal atrial fibrillation/flutter.  His medications were adjusted and  he was started on antiarrhythmic therapy with Rythmol to take in addition to his increasing doses of carvedilol.  He was started on Eliquis for anticoagulation.  He underwent successful cardioversion on 08/05/2013.  Since that time, he is unaware of any recurrent atrial fibrillation.  He has noticed more energy. He had been maintained on 50 mg twice a day of carvedilol in addition to Rythmol 225 mg every 8 hours, but due to slow pulse rates, these doses have ultimately been reduced.  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 He has been on CPAP therapy initially for approximately 8 years and has been on BiPAP auto SV for over 4 years. He admits to using his BiPAP with 100% compliance.  He obtained a new BiPAP machine in June.  He feels that his machine is  significantly improved from his prior one. When I last saw him I reviewed his  download of his new BiPAP auto SV machine indicates that 90% of the time is EPAP pressure was 17 and 90% of the time his pressure support was 5.8, giving an IPAP of 21.8 cm.  He is using it 100% of the time and is averaging 5 hours and 49 minutes on his most recent download from 12/07/2013 through 01/05/2014.  His average AHI was 7.5.  His device setting maximum BiPAP pressure is 25 cm in maximum EPAP pressure 21 cm.  His average central event index is 1.3, and average obstructive apneic index 0.2, with an average copy index of 6.0.  Average breath.  Rate is 18 breaths per minute with a minute ventilation of 12.1.  Average total body may 670 mL.  When I saw him on 10/21/15 he was unaware of any arrhythmia.  He was on carvedilol 12.5 mg twice a day, Rythmol 225 mg twice a day, hydralazine 25 mg twice a day and eliquis  5 mg twice a day.  He also has been taking omeprazole for GERD and atorvastatin 10 mg for hyperlipidemia. He was found to have recurrent atrial flutter with variable block.  At that time, I recommended that he increase his Rythmol and take 225 mg every 8 hours and further increased his carvedilol to 18.75 mg twice a day.  Laboratory had revealed a magnesium of 2.0.  He has stage III chronic kidney disease  and his BUN was 26, creatinine 1.71 with a GFR estimate of 39.  Potassium was 4.8.  TSH was normal at 2.4.  Insomnia.  One week later in follow-up he had reverted back to sinus rhythm..  When I saw him in September 2017, he was maintaining sinus rhythm.  Since then, he admits to weight gain.  In early April, he began to notice some increasing shortness of breath and elevated heart rate and fatigue.  He was seen by Hao Meng on 09/07/2016 and was found to be back in atrial flutter with a ventricular rate at 90.  He has been on eloquence 5 g twice a day for anticoagulation and was on carvedilol 18.75 twice a day.   Carvedilol dose was increased to 25 mg twice a day.  He was also told at that time to try increasing his Propofenone to 300 mg 3 times a day.  He took this for several days but did not tolerate the increased dose and reduced it back to its previous dose of 225 mg every 8 hours.  When I saw him on 10/20/2016  I increased carvedilol to 37.5 mg twice a day.  He has continued to use his BiPAP therapy for sleep apnea.    When I saw him in June 2018 he was still in atrial flutter with 3:1 block.  I discontinued hydralazine and started him on Cardizem CD 180 mg, both for blood pressure and potential antiarrhythmic therapy. He also was having ankle edema and I started him on low-dose hydrochlorothiazide.  His peripheral edema significantly improved.  He has felt better.  He denied episodes of chest tightness.  He underwent successful outpatient DC cardioversion by me on 12/26/2016.  He successfully cardioverted at 120 joules.  When he returned for a follow-up evaluation on 01/12/2017 and saw  Hao Meng, PAC .  He was back in atrial flutter.  At that time his propafenone was discontinued.  Over the past several months, Mr. Tisdel has felt well.  At times there is some trace swelling in his ankles.  He continues to use his BiPAP with 100% compliance.  He was hospitalized on September 19 with an episode of diverticulitis and was discharged on 02/24/2017.   When I saw him in October 2018, he was back in atrial flutter.  At that time, I recommended consideration for atrial flutter ablation.  He was evaluated by Dr. Klein and on 04/19/2017 underwent successful atrial flutter ablation across the caval tricuspid isthmus, which interrupted bidirectional conduction.  Initial plan was for him to continue anticoagulation for 4 weeks, but unfortunately he developed irregular tachycardia palpitations about 48 hours after the procedure.  He's been found to have paroxysmal atrial fibrillation.  He as result is on chronic  anticoagulation therapy.  In November 2018 an echo Doppler study showed an EF of 40-45%.  He has been on carvedilol 25 mg twice a day and takes an extra carvedilol as needed.  He has had recurrent short burst of atrial fibrillation, but had an episode for almost an entire day.  He received a new BiPAP machine after his previous machine completely malfunction.  He admits to 100% compliance.  [facility] is his DME company.  He has diabetes mellitus and was started back on Tradjenta for hemoglobin A1c of 7.    Since I last saw him, he has felt well.  He has a [employer] Dream Station BiPAP auto SV unit. I obtained a download from September 02, 2017 through   October 01, 2017.  He is compliant.  His average CPAP pressure was 16.8 and average pressure support pressure was 4.1.  90% of the time device EPAP was 17 cm.  AHI, however, was still mildly increased at 8.9.  He is unaware of breakthrough snoring.  He denies restless legs.  He denies chest pain.  He has noticed some mild episodes of dizziness in the morning.  He presents for evaluation.  Past Medical History  Diagnosis Date  . GERD 12/07/2006  . HYPERGLYCEMIA 12/07/2006  . HYPERLIPIDEMIA 12/07/2006    mixed wth an atherogenic dyslipidemic pattern  . HYPERTENSION 12/07/2006  . OBESITY 11/10/2009  . THROMBOCYTOPENIA 12/07/2006  . TRANSAMINASES, SERUM, ELEVATED 12/07/2006  . SLEEP APNEA 10/22/2007    uses bipap  . 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 kelly  . Diabetes mellitus without complication   . Renal insufficiency     Cr to 3 on ACE  . Nephrolithiasis   . Normal coronary arteries 04/06/2007    by cath EF 55%., last ECHO 10/13/10 EF>55% mild MR, last nuc 06/16/10 EF 57% low risk scan  . Pulmonary HTN     RV syst.  40-50mmHg- moderate by echo  . Palpitations     tachy  . PAF (paroxysmal atrial fibrillation) 04/09/2013    Past Surgical History  Procedure Laterality Date  . Cholecystectomy    . Rotator cuff repair    .  Cystoscopy/retrograde/ureteroscopy  08/19/2011    Procedure: CYSTOSCOPY/RETROGRADE/URETEROSCOPY;  Surgeon: Marc-Henry Nesi, MD;  Location: [facility];  Service: Urology;  Laterality: Right;  JJ STENT PLACEMENT   . Cardiac catheterization  04/06/2007    normal cors    Allergies  Allergen Reactions  . Lisinopril     Renal insufficiency    Current Outpatient Medications:  .  atorvastatin (LIPITOR) 10 MG tablet, TAKE 1 TABLET BY MOUTH AT BEDTIME (Patient taking differently: TAKE 10 MG BY MOUTH AT BEDTIME), Disp: 30 tablet, Rfl: 9 .  carvedilol (COREG) 25 MG tablet, Take 1 tablet (25 mg total) by mouth 2 (two) times daily with a meal., Disp: 90 tablet, Rfl: 12 .  diltiazem (CARDIZEM CD) 240 MG 24 hr capsule, Take 1 capsule (240 mg total) by mouth at bedtime., Disp: 90 capsule, Rfl: 3 .  ELIQUIS 5 MG TABS tablet, TAKE 1 TABLET BY MOUTH TWICE A DAY, Disp: 180 tablet, Rfl: 1 .  hydrochlorothiazide (HYDRODIURIL) 25 MG tablet, Take 1 tablet (25 mg total) by mouth daily., Disp: 30 tablet, Rfl: 4 .  linagliptin (TRADJENTA) 5 MG TABS tablet, Take 5 mg by mouth daily., Disp: , Rfl:  .  Multiple Vitamins-Minerals (CENTRUM SILVER 50+MEN) TABS, Take 1 tablet by mouth daily. , Disp: , Rfl:  .  omega-3 acid ethyl esters (LOVAZA) 1 g capsule, TAKE 2 CAPSULES BY MOUTH EVERY DAY, Disp: 60 capsule, Rfl: 6 .  Oxymetazoline HCl (NASAL SPRAY NA), Place 2 sprays at bedtime as needed into both nostrils (for congestion)., Disp: , Rfl:  .  potassium citrate (UROCIT-K) 10 MEQ (1080 MG) SR tablet, Take 10 mEq by mouth 2 (two) times daily. , Disp: , Rfl:  .  raNITIdine HCl (ZANTAC PO), Take by mouth., Disp: , Rfl:  .  sildenafil (VIAGRA) 100 MG tablet, Take 0.5-1 tablets (50-100 mg total) by mouth daily as needed for erectile dysfunction., Disp: 5 tablet, Rfl: 11  Current Facility-Administered Medications:  .  0.9 %  sodium chloride infusion, 500 mL, Intravenous, Once, Armbruster, Steven P,   MD  Socially  he is married has 4 children and 5 grandchildren. He is a distant relative to the "Peppers brothers." He does try to walk and exercise. There is no tobacco use. He does drink occasional alcohol.   ROS General: Negative; No fevers, chills, or night sweats;  HEENT: Negative; No changes in vision or hearing, sinus congestion, difficulty swallowing Pulmonary: Negative; No cough, wheezing, shortness of breath, hemoptysis Cardiovascular: See history of present illness No recent peripheral edema GI: Status post recent episode of diverticulitis requiring 2 day hospitalization GU:  No dysuria, hematuria, or difficulty voiding; some difficulty with erectile function Musculoskeletal: Negative; no myalgias, joint pain, or weakness Hematologic/Oncology: Negative; no easy bruising, bleeding Endocrine: Negative; no heat/cold intolerance; no diabetes Neuro: Negative; no changes in balance, headaches Skin: Negative; No rashes or skin lesions Psychiatric: Negative; No behavioral problems, depression Sleep: He is using his BiPAP therapy with 100% compliance.  No snoring, daytime sleepiness, hypersomnolence, bruxism, restless legs, hypnogognic hallucinations, no cataplexy Other comprehensive 14 point system review is negative.  PE BP (!) 143/83   Pulse (!) 50   Ht 5' 11" (1.803 m)   Wt 236 lb 9.6 oz (107.3 kg)   BMI 33.00 kg/m    Repeat blood pressure by me was 126/82  Wt Readings from Last 3 Encounters:  10/03/17 236 lb 9.6 oz (107.3 kg)  06/20/17 250 lb (113.4 kg)  06/16/17 247 lb (112 kg)   General: Alert, oriented, no distress.  Skin: normal turgor, no rashes, warm and dry HEENT: Normocephalic, atraumatic. Pupils equal round and reactive to light; sclera anicteric; extraocular muscles intact;  Nose without nasal septal hypertrophy Mouth/Parynx benign; Mallinpatti scale 3 Neck: No JVD, no carotid bruits; normal carotid upstroke Lungs: clear to ausculatation and percussion; no wheezing or  rales Chest wall: without tenderness to palpitation Heart: PMI not displaced, RRR, s1 s2 normal, 1/6 systolic murmur, no diastolic murmur, no rubs, gallops, thrills, or heaves Abdomen: soft, nontender; no hepatosplenomehaly, BS+; abdominal aorta nontender and not dilated by palpation. Back: no CVA tenderness Pulses 2+ Musculoskeletal: full range of motion, normal strength, no joint deformities Extremities: no clubbing cyanosis or edema, Homan's sign negative  Neurologic: grossly nonfocal; Cranial nerves grossly wnl Psychologic: Normal mood and affect   ECG (independently read by me):  Sinus bradycardia at 50 bpm.  Right bundle branch block with repolarization changes. QTc interval 461 ms.  January 2019 ECG (independently read by me): Sinus bradycardia 56 bpm.  Possible left atrial enlargement.  Right bundle branch block with repolarization changes.  PR interval 186 ms; QTc interval 436 ms  October 2018 ECG (independently read by me): Atrial flutter with variable block at 96 bpm.  Right bundle-branch block with repolarization changes.  QTc interval 437 ms  July 2018 ECG (independently read by me): Probable atrial flutter with a ventricular rate at 82 bpm.  Right bundle-branch block, left anterior hemiblock.  11/08/2016 ECG (independently read by me): Probable atrial flutter 3:1 block , ventricular rate at 85;  right bundle branch block with repolarization changes.  QTc interval 528 ms.   May 2018 ECG (independently read by me): atrial flutter with variable block with ventricular rate at 89 bpm.  Right bundle-branch block, left anterior hemiblock.  September 2017 ECG (independently read by me): Normal sinus rhythm at 63 bpm.  First-degree AV block with a PR interval of 208 ms.  Right bundle branch block with repolarization changes.  QTc interval 466 ms.  10/28/15 ECG (independently read by me):   Sinus bradycardia at 54 bpm.  First degree block with PR interval 222 ms.  Right bundle-branch  block.  10/21/2015 ECG (independently read by me): Atrial flutter with variable block.  Right bundle-branch block with repolarization changes.  October 2016 ECG (independently read by me): sinus rhythm with first-degree AV block with a PR interval at 216 ms.  Ventricular rate 86 bpm with occasional PVCs.    May 2016 ECG (independently read by me): Normal sinus rhythm at 60 bpm.  Right bundle-branch block with repolarization changes.  November 2015 ECG (independently read by me): Normal sinus rhythm at 61.  Nonspecific T abnormality.  QTc interval 394 ms.  11/18/2013 ECG (independently read by me): Sinus bradycardia 58 beats per minute.  PR interval 198 ms; QTc interval 371 ms.  Nonspecific ST changes.  07/29/2013 ECG  (independently read by me): Atrial flutter with 2:1 block with a ventricular rate of 87 beats per minute. Atrial rate is approximately 360 ms. 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 fib flutter now with right bundle branch block and repolarization changes.  Prior ECG of 06/04/2013: EKG  suggests probable atrial flutter with a ventricular rate of 105 beats per minute. There also are frequent PVCs. QTc interval is 436 ms. They're nonspecific T changes.  LABS: BMP Latest Ref Rng & Units 07/21/2017 04/10/2017 02/28/2017  Glucose 65 - 99 mg/dL - 192(H) 144(H)  BUN 8 - 27 mg/dL - 24 29(H)  Creatinine 0.61 - 1.24 mg/dL 1.78(H) 1.71(H) 1.83(H)  BUN/Creat Ratio 10 - 24 - 14 16  Sodium 134 - 144 mmol/L - 139 140  Potassium 3.5 - 5.2 mmol/L - 3.8 4.9  Chloride 96 - 106 mmol/L - 103 104  CO2 20 - 29 mmol/L - 24 24  Calcium 8.6 - 10.2 mg/dL - 9.3 9.5   Hepatic Function Latest Ref Rng & Units 02/22/2017 10/25/2016 05/17/2016  Total Protein 6.5 - 8.1 g/dL 6.6 6.8 7.3  Albumin 3.5 - 5.0 g/dL 3.4(L) 4.3 3.4(L)  AST 15 - 41 U/L 35 19 22  ALT 17 - 63 U/L 42 25 26  Alk Phosphatase 38 - 126 U/L 44 39(L) 51  Total Bilirubin  0.3 - 1.2 mg/dL 2.2(H) 0.8 0.56  Bilirubin, Direct 0.0 - 0.3 mg/dL - - -   CBC Latest Ref Rng & Units 04/10/2017 02/28/2017 02/24/2017  WBC 3.4 - 10.8 x10E3/uL 5.2 7.1 4.1  Hemoglobin 13.0 - 17.7 g/dL 13.5 14.2 12.4(L)  Hematocrit 37.5 - 51.0 % 41.1 41.7 37.0(L)  Platelets 150 - 379 x10E3/uL 145(L) 159 62(L)   Lab Results  Component Value Date   TSH 2.40 10/26/2015   Lipid Panel     Component Value Date/Time   CHOL 110 10/25/2016 0853   TRIG 185 (H) 10/25/2016 0853   HDL 32 (L) 10/25/2016 0853   CHOLHDL 3.4 10/25/2016 0853   VLDL 37 (H) 10/25/2016 0853   LDLCALC 41 10/25/2016 0853   LDLDIRECT 65.0 04/12/2013 0825     RADIOLOGY: No results found.  IMPRESSION:  1. OSA (obstructive sleep apnea) on  BiPAPBiPAP   2. PAF (paroxysmal atrial fibrillation) (HCC)   3. Essential hypertension   4. Left ventricular hypertrophy   5. Anticoagulation adequate   6. Type 2 diabetes mellitus with chronic kidney disease, without long-term current use of insulin, unspecified CKD stage (HCC)     ASSESSMENT AND PLAN: Mr. Gunderman is a 77-year-old Caucasian male who has documented normal coronary arteries by   heart catheterization in 2008. He has a history of hypertension, mixed hyperlipidemia, and has paroxysmal atrial fibrillation/flutter. He has a history of diabetes mellitus and after he had lost a significant amount of weight and with renal insufficiency he was taken off his diabetic medication.  Subsequent, he has gained a majority of his weight back.   Due to recurrent episodes of paroxysmal atrial flutter he underwent successful catheter ablation in November 2018.  However, he has experienced recurrent episodes of short-lived paroxysmal atrial fibrillation following his atrial flutter ablation.  He is on long-term anticoagulation with Eliquis 5 mg twice a day without bleeding.  His last echo Doppler study showed an EF of 40 to 45%.  He is only, he has noticed some mild dizziness in the morning.   He has continued to take carvedilol 25 mg twice a day, diltiazem long-acting 300 mg daily in addition to HCTZ 25 mg.  His ECG today shows sinus bradycardia at 50 bpm with right bundle branch block.  With his bradycardia, I have suggested reducing Cardizem and will change him to Cardizem CD 240 mg at bedtime.  He was started on Tradjenta by Dr. Robina Ade.  With his LV dysfunction, he may be a candidate for Jardiance as long as his creatinine creatinine clearance stays above 45.  With reference to his obstructive sleep apnea, he received a new [employer] dream station BiPAP auto SV unit.  He is meeting compliance standards with 80% of usage days and average usage at 5 hours and 54 minutes.  His AHI is still elevated at 8.9.  I have interrogated his unit.  I will change his pressure support to 5.5 we will also change his ramp time to 5 minutes.  Previously his ramp start pressure was 4 cm and I will increase this to EPAP of 9 cm water pressure   He is using a [employer] Simplus medium size facemask.  There is no significant leak.  He will be following up with Dr. Caryl Comes.  Previously there was discussion for a cardiac MRI which had been canceled.  I  will see him in 3 months for follow-up evaluation.  Time spent: 25 minutes Shelva Majestic, MD  10/05/2017  6:00 PM

## 2017-10-03 NOTE — Patient Instructions (Signed)
Medication Instructions:  DECREASE Cardizem to 240 mg daily at bedtime   Follow-Up: 3 months with Dr. Claiborne Billings (sleep clinic)   If you need a refill on your cardiac medications before your next appointment, please call your pharmacy.

## 2017-10-05 ENCOUNTER — Encounter: Payer: Self-pay | Admitting: Cardiovascular Disease

## 2017-10-16 NOTE — Progress Notes (Signed)
Martin Cordova Kitchen    HEMATOLOGY/ONCOLOGY CONSULTATION NOTE  Date of Service: 10/17/2017   Patient Care Team: Susy Frizzle, MD as PCP - General (Family Medicine) Martin Sine, MD as PCP - Cardiology (Cardiology)  CHIEF COMPLAINTS/PURPOSE OF CONSULTATION:  Follow up for Thrombocytopenia and MGUS   HISTORY OF PRESENTING ILLNESS:   Martin Cordova is a wonderful 77 y.o. male who has been referred to [place] by Dr Martin Cordova for evaluation and management of thrombocytopenia.  Patient has a h/o HTN, HLD, DM2, sleep apnea, GERD, CAD, obesity and NASH who was referred for evaluation of thrombocytopenia.  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  Hgb and WBC count have been WNL. No issues with bleeding or bruising, No recent new medications. He is on chronic PPI therapy. He is chronic anticoagulation for his atrial fibrillation.  No other acute new concerns.  INTERVAL HISTORY   Martin Cordova last saw me on 05/17/16 for follow up of his thrombocytopenia. He presents to the clinic today accompanied by his wife. He was referred back to me by his urologist for concerns of MGUS due to elevated protein in his urine. Pt completed a 24 hour urine in 09/2017.  Pt notes his father passed away from renal failure in his 17s and Pt's nephrologist was concerned about polycystic kidneys. Pt notes hisnephrologist told him not to take any more Prilosec. On review of symptoms, pt denies bone pain. No issues with bleeding or excessive bruising.     MEDICAL HISTORY:  Past Medical History:  Diagnosis Date  . Colon polyps   . Diabetes mellitus without complication ([place])   . Diverticulitis   . Diverticulosis   . GERD 12/07/2006  . HYPERGLYCEMIA 12/07/2006  . HYPERLIPIDEMIA 12/07/2006   mixed wth an atherogenic dyslipidemic pattern  . HYPERTENSION 12/07/2006  . Nephrolithiasis   . Nonalcoholic fatty liver disease   . Normal coronary arteries 04/06/2007   by  cath EF 55%., last ECHO 10/13/10 EF>55% mild Martin, last nuc 06/16/10 EF 57% low risk scan  . OBESITY 11/10/2009  . OSA treated with BiPAP 10/22/2007  . PAF (paroxysmal atrial fibrillation) ([place]) 04/09/2013  . Palpitations    tachy  . Pulmonary HTN ([facility])    RV syst.  40-14mHg- moderate by echo  . Renal insufficiency    Cr to 3 on ACE  . Sleep apnea   . SOB (shortness of breath)    with exertion  . THROMBOCYTOPENIA 12/07/2006  . TRANSAMINASES, SERUM, ELEVATED 12/07/2006  . 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 kClaiborne Cordova   SURGICAL HISTORY: Past Surgical History:  Procedure Laterality Date  . A-FLUTTER ABLATION N/A 04/19/2017   Procedure: A-FLUTTER ABLATION;  Surgeon: KDeboraha Sprang MD;  [facility];  Service: Cardiovascular;  Laterality: N/A;  . CARDIAC CATHETERIZATION  04/06/2007   normal cors  . CARDIOVERSION N/A 08/05/2013   Procedure: CARDIOVERSION;  Surgeon: TTroy Sine MD;  [facility]  Service: Cardiovascular;  Laterality: N/A;  . CARDIOVERSION N/A 12/26/2016   Procedure: CARDIOVERSION;  Surgeon: KTroy Sine MD;  [facility];  Service: Cardiovascular;  Laterality: N/A;  . CHOLECYSTECTOMY    . CYSTOSCOPY/RETROGRADE/URETEROSCOPY  08/19/2011   Procedure: CYSTOSCOPY/RETROGRADE/URETEROSCOPY;  Surgeon: MHanley Ben MD;  [facility]  Service: Urology;  Laterality: Right;  JJ STENT PLACEMENT  . ROTATOR CUFF REPAIR Right     SOCIAL HISTORY: Social History  Socioeconomic History  . Marital status: Married    Spouse name: Not on file  . Number of children: 4  . Years of education: Not on file  . Highest education level: Not on file  Occupational History  . Occupation: retired Tour manager  . Financial resource strain: Not on file  . Food insecurity:    Worry: Not on file    Inability: Not on file  . Transportation needs:    Medical: Not on file    Non-medical: Not on file  Tobacco  Use  . Smoking status: Former Smoker    Years: 10.00    Types: Cigarettes    Last attempt to quit: 08/04/1980    Years since quitting: 37.2  . Smokeless tobacco: Former Systems developer    Types: [place] date: 08/08/2004  Substance and Sexual Activity  . Alcohol use: Yes    Alcohol/week: 0.0 oz    Comment: 1 beer 2-3 times per month  . Drug use: No  . Sexual activity: Yes  Lifestyle  . Physical activity:    Days per week: Not on file    Minutes per session: Not on file  . Stress: Not on file  Relationships  . Social connections:    Talks on phone: Not on file    Gets together: Not on file    Attends religious service: Not on file    Active member of club or organization: Not on file    Attends meetings of clubs or organizations: Not on file    Relationship status: Not on file  . Intimate partner violence:    Fear of current or ex partner: Not on file    Emotionally abused: Not on file    Physically abused: Not on file    Forced sexual activity: Not on file  Other Topics Concern  . Not on file  Social History Narrative  . Not on file    FAMILY HISTORY: Family History  Problem Relation Age of Onset  . Diabetes Mother   . Heart failure Mother   . Stroke Mother   . Kidney failure Father   . Diabetes Father   . Cancer Paternal Uncle   . Colon cancer Neg Hx   . Esophageal cancer Neg Hx   . Stomach cancer Neg Hx   . Rectal cancer Neg Hx     ALLERGIES:  is allergic to lisinopril and ibuprofen.  MEDICATIONS:  Current Outpatient Medications  Medication Sig Dispense Refill  . atorvastatin (LIPITOR) 10 MG tablet TAKE 1 TABLET BY MOUTH AT BEDTIME (Patient taking differently: TAKE 10 MG BY MOUTH AT BEDTIME) 30 tablet 9  . carvedilol (COREG) 25 MG tablet Take 1 tablet (25 mg total) by mouth 2 (two) times daily with a meal. 90 tablet 12  . diltiazem (CARDIZEM CD) 240 MG 24 hr capsule Take 1 capsule (240 mg total) by mouth at bedtime. 90 capsule 3  . ELIQUIS 5 MG TABS tablet TAKE 1  TABLET BY MOUTH TWICE A DAY 180 tablet 1  . hydrochlorothiazide (HYDRODIURIL) 25 MG tablet Take 1 tablet (25 mg total) by mouth daily. 30 tablet 4  . linagliptin (TRADJENTA) 5 MG TABS tablet Take 5 mg by mouth daily.    . Multiple Vitamins-Minerals (CENTRUM SILVER 50+MEN) TABS Take 1 tablet by mouth daily.     Martin Cordova Kitchen omega-3 acid ethyl esters (LOVAZA) 1 g capsule TAKE 2 CAPSULES BY MOUTH EVERY DAY 60 capsule 6  . Oxymetazoline HCl (  NASAL SPRAY NA) Place 2 sprays at bedtime as needed into both nostrils (for congestion).    . potassium citrate (UROCIT-K) 10 MEQ (1080 MG) SR tablet Take 10 mEq by mouth 2 (two) times daily.     . raNITIdine HCl (ZANTAC PO) Take by mouth.    . sildenafil (VIAGRA) 100 MG tablet Take 0.5-1 tablets (50-100 mg total) by mouth daily as needed for erectile dysfunction. 5 tablet 11   Current Facility-Administered Medications  Medication Dose Route Frequency Provider Last Rate Last Dose  . 0.9 %  sodium chloride infusion  500 mL Intravenous Once Armbruster, Carlota Raspberry, MD        REVIEW OF SYSTEMS: .10 Point review of Systems was done is negative except as noted above.  PHYSICAL EXAMINATION: ECOG PERFORMANCE STATUS: 1 - Symptomatic but completely ambulatory  Filed Weights   10/17/17 1311  Weight: 234 lb 12.8 oz (106.5 kg)   .Body mass index is 32.75 kg/m. Martin Cordova Kitchen GENERAL:alert, in no acute distress and comfortable SKIN: no acute rashes, no significant lesions EYES: conjunctiva are pink and non-injected, sclera anicteric OROPHARYNX: MMM, no exudates, no oropharyngeal erythema or ulceration NECK: supple, no JVD LYMPH:  no palpable lymphadenopathy in the cervical, axillary or inguinal regions LUNGS: clear to auscultation b/l with normal respiratory effort HEART: regular rate & rhythm ABDOMEN:  normoactive bowel sounds , non tender, not distended. bordenline palpable splenomegaly Extremity: no pedal edema PSYCH: alert & oriented x 3 with fluent speech NEURO: no focal  motor/sensory deficits    LABORATORY DATA:  I have reviewed the data as listed  Component     Latest Ref Rng & Units 11/16/2015  WBC     4.0 - 10.3 10e3/uL 4.8  NEUT#     1.5 - 6.5 10e3/uL 3.2  Hemoglobin     13.0 - 17.1 g/dL 14.0  HCT     38.4 - 49.9 % 41.2  Platelets     140 - 400 10e3/uL 100 (L)  MCV     79.3 - 98.0 fL 91.6  MCH     27.2 - 33.4 pg 31.1  MCHC     32.0 - 36.0 g/dL 34.0  RBC     4.20 - 5.82 10e6/uL 4.50  RDW     11.0 - 14.6 % 13.2  lymph#     0.9 - 3.3 10e3/uL 1.0  MONO#     0.1 - 0.9 10e3/uL 0.5  Eosinophils Absolute     0.0 - 0.5 10e3/uL 0.1  Basophils Absolute     0.0 - 0.1 10e3/uL 0.0  NEUT%     39.0 - 75.0 % 66.0  LYMPH%     14.0 - 49.0 % 21.3  MONO%     0.0 - 14.0 % 10.0  EOS%     0.0 - 7.0 % 2.3  BASO%     0.0 - 2.0 % 0.4  Retic %     0.80 - 1.80 % 1.40  Retic Ct Abs     34.80 - 93.90 10e3/uL 63.00  Immature Retic Fract     3.00 - 10.60 % 5.20  Sodium     136 - 145 mEq/L 143  Potassium     3.5 - 5.1 mEq/L 4.8  Chloride     98 - 109 mEq/L 109  CO2     22 - 29 mEq/L 30 (H)  Glucose     70 - 140 mg/dl 85  BUN     7.0 - 26.0 mg/dL  22.6  Creatinine     0.7 - 1.3 mg/dL 1.7 (H)  Total Bilirubin     0.20 - 1.20 mg/dL 0.88  Alkaline Phosphatase     40 - 150 U/L 38 (L)  AST     5 - 34 U/L 17  ALT     0 - 55 U/L 24  Total Protein     6.4 - 8.3 g/dL 7.0  Albumin     3.5 - 5.0 g/dL 3.8  Calcium     8.4 - 10.4 mg/dL 9.5  Anion gap     3 - 11 mEq/L 4  EGFR     >90 ml/min/1.73 m2 40 (L)  HCV Ab     0.0 - 0.9 s/co ratio <0.1  Comment      Comment  HIV     Non Reactive Non Reactive  Vitamin B12     211 - 946 pg/mL 583  . CBC Latest Ref Rng & Units 10/17/2017 04/10/2017 02/28/2017  WBC 4.0 - 10.3 K/uL 5.4 5.2 7.1  Hemoglobin 13.0 - 17.1 g/dL 14.5 13.5 14.2  Hematocrit 38.4 - 49.9 % 43.3 41.1 41.7  Platelets 140 - 400 K/uL 102(L) 145(L) 159     . CBC    Component Value Date/Time   WBC 5.4 10/17/2017 1254   RBC  4.72 10/17/2017 1254   RBC 4.72 10/17/2017 1254   HGB 14.5 10/17/2017 1254   HGB 13.5 04/10/2017 0854   HGB 14.2 05/17/2016 0756   HCT 43.3 10/17/2017 1254   HCT 41.1 04/10/2017 0854   HCT 42.5 05/17/2016 0756   PLT 102 (L) 10/17/2017 1254   PLT 145 (L) 04/10/2017 0854   MCV 91.7 10/17/2017 1254   MCV 91 04/10/2017 0854   MCV 92.4 05/17/2016 0756   MCH 30.7 10/17/2017 1254   MCHC 33.5 10/17/2017 1254   RDW 13.4 10/17/2017 1254   RDW 14.8 04/10/2017 0854   RDW 12.8 05/17/2016 0756   LYMPHSABS 1.0 10/17/2017 1254   LYMPHSABS 0.8 04/10/2017 0854   LYMPHSABS 1.1 05/17/2016 0756   MONOABS 0.6 10/17/2017 1254   MONOABS 0.7 05/17/2016 0756   EOSABS 0.1 10/17/2017 1254   EOSABS 0.2 04/10/2017 0854   BASOSABS 0.0 10/17/2017 1254   BASOSABS 0.0 04/10/2017 0854   BASOSABS 0.0 05/17/2016 0756    . CMP Latest Ref Rng & Units 10/17/2017 07/21/2017 04/10/2017  Glucose 70 - 140 mg/dL 92 - 192(H)  BUN 7 - 26 mg/dL 35(H) - 24  Creatinine 0.70 - 1.30 mg/dL 2.20(H) 1.78(H) 1.71(H)  Sodium 136 - 145 mmol/L 141 - 139  Potassium 3.5 - 5.1 mmol/L 4.5 - 3.8  Chloride 98 - 109 mmol/L 105 - 103  CO2 22 - 29 mmol/L 30(H) - 24  Calcium 8.4 - 10.4 mg/dL 9.7 - 9.3  Total Protein 6.4 - 8.3 g/dL 7.3 - -  Total Bilirubin 0.2 - 1.2 mg/dL 0.9 - -  Alkaline Phos 40 - 150 U/L 45 - -  AST 5 - 34 U/L 22 - -  ALT 0 - 55 U/L 32 - -   OUTSIDE LABS from [facility] 08/07/17       RADIOGRAPHIC STUDIES: I have personally reviewed the radiological images as listed and agreed with the findings in the report.  US Renal 08/10/17  IMPRESSION: Multiple renal cysts of varying sizes are noted bilaterally consistent with polycystic kidney disease. Increased echogenicity of renal parenchyma is noted consistent with medical renal disease. No hydronephrosis or renal obstruction is  noted   ABDOMEN ULTRASOUND COMPLETE  COMPARISON:  CT urogram of August 15, 2011 and a right upper quadrant ultrasound of September 23, 2008  FINDINGS: Gallbladder: The gallbladder is surgically absent.  Common bile duct: Diameter: 5 mm where visualized  Liver: Bowel gas obscures the left hepatic lobe. The echotexture of the liver is somewhat heterogeneously increased. There is no discrete mass or ductal dilation.  IVC: Largely obscured by bowel gas.  Pancreas: Obscured by bowel gas.  Spleen: Normal in size and echotexture  Right Kidney: Length: 11.3 cm. The renal cortical echotexture is increased. There are multiple cysts in the right kidney with the largest measuring 5.6 x 4.3 x 4.2 cm. No hydronephrosis is observed.  Left Kidney: Length: 21 cm. The renal cortical echotexture is increased. There are multiple cysts with the largest measuring 9.1 x 6.5 x 9.2 cm.  Abdominal aorta: No aneurysm visualized. Limited visualization due to bowel gas.  Other findings: None.  IMPRESSION: 1. Limited visualization of the liver. The left lobe is not well visualized. The hepatic echotexture is heterogeneously increased without evidence of a discrete mass. The gallbladder is surgically absent. 2. Large kidneys with increased parenchymal echotexture and multiple large cysts. These findings have been demonstrated in the past. 3. Limited visualization of the CBD, IVC, abdominal aorta, and pancreas due to bowel gas and the patient's body habitus.   .Dg Bone Survey Met  Result Date: 10/23/2017 CLINICAL DATA:  Monoclonal paraproteinemia. EXAM: METASTATIC BONE SURVEY COMPARISON:  None. FINDINGS: There are no lytic lesions in the skeleton. The patient has degenerative disc disease at the lower cervical spine and in the lumbar spine. There are arthritic changes at the right radiocarpal joint. Slight AC joint arthropathy bilaterally. Degenerative changes of the greater tuberosity of the proximal humeri. IMPRESSION: No evidence of multiple myeloma. Multifocal degenerative changes as described. Electronically Signed   By:  Lorriane Shire M.D.   On: 10/23/2017 17:37      ASSESSMENT & PLAN:   77 yo caucasian male with multiple medical co-morbidities as noted above with   1) Chronic mild Isolated thrombocytopenia ( plt 90-110k).  Patient thrombocytopenia hasn't changed significantly since 2014. 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 Medication effect could be an additional element (propafenone, ppi ) Cannot r/o an immune component. No evidence of pseudothrombocytopenia on PBS. HIV and Hep C neg B12 wnl   2) MGUS - IgG Labda  09/2017 24 urine showed M-Protein positive in his urine at 0.9g/mL Component     Latest Ref Rng & Units 10/17/2017  IgG (Immunoglobin G), Serum     700 - 1,600 mg/dL 1,171  IgA     61 - 437 mg/dL 104  IgM (Immunoglobulin M), Srm     15 - 143 mg/dL 37  Total Protein ELP     6.0 - 8.5 g/dL 7.1  Albumin SerPl Elph-Mcnc     2.9 - 4.4 g/dL 3.9  Alpha 1     0.0 - 0.4 g/dL 0.2  Alpha2 Glob SerPl Elph-Mcnc     0.4 - 1.0 g/dL 0.7  B-Globulin SerPl Elph-Mcnc     0.7 - 1.3 g/dL 1.0  Gamma Glob SerPl Elph-Mcnc     0.4 - 1.8 g/dL 1.2  M Protein SerPl Elph-Mcnc     Not Observed g/dL 0.9 (H)  Globulin, Total     2.2 -  3.9 g/dL 3.2  Albumin/Glob SerPl     0.7 - 1.7 1.3  IFE 1      Comment  Please Note (HCV):      Comment  Kappa free light chain     3.3 - 19.4 mg/L 32.7 (H)  Lamda free light chains     5.7 - 26.3 mg/L 95.0 (H)  Kappa, lamda light chain ratio     0.26 - 1.65 0.34  Sed Rate     0 - 16 mm/hr 2  Beta-2 Microglobulin     0.6 - 2.4 mg/L 3.0 (H)  LDH     125 - 245 U/L 390 (H)   Neg bone survey.  3) CKD Stage III, nephrolithiasis, Polycystic Kidneys -Pt's father died from renal failure in his 39s.  -cysts were seen on 08/10/17 US renal  -He will continue to be seen by urologist who suggested he go on a low salt  and low sugar diet.    PLAN  -I discussed his current change in his kidney has allowed proteins to leak through mildly as seen on his last 24 hour urine.  -I explained the present M-spike is likely MGUS.discussed the implications of monoclonal paraproteinemia. -I discussed his risk for his MGUS progressing to a blood cancer or amyloidosis. We would watch for CRAB criteria or large amounts of protein in the urine while obseving his MGUS.  -He is asymptomatic, at this time a bone marrow biopsy is not needed -I will do a blood test to complete characterization his blood tests. I recommend a whole body scan to rule out any concerning bone tumors. He agreed. -ok to continue therapeutic anticoagulation at these platelet level. Would need to be careful if platelets drop to <=50k.    Additional labs today Bone survey in 1 week RTC with dr Irene Limbo in 4 months with labs   All of the patients questions were answered with apparent satisfaction. The patient knows to call the clinic with any problems, questions or concerns.  . The total time spent in the appointment was 25 minutes and more than 50% was on counseling and direct patient cares.     Sullivan Lone MD [place] AAHIVMS [facility] Hematology/Oncology Physician [place]  (Office):       [phone] (Work cell):  [phone] (Fax):           [phone]  This document serves as a record of services personally performed by Sullivan Lone, MD. It was created on his behalf by Joslyn Devon, a trained medical scribe. The creation of this record is based on the scribe's personal observations and the provider's statements to them.    .I have reviewed the above documentation for accuracy and completeness, and I agree with the above. Brunetta Genera MD MS

## 2017-10-17 ENCOUNTER — Inpatient Hospital Stay: Payer: Medicare Other | Attending: Hematology | Admitting: Hematology

## 2017-10-17 ENCOUNTER — Inpatient Hospital Stay: Payer: Medicare Other

## 2017-10-17 ENCOUNTER — Other Ambulatory Visit: Payer: Self-pay | Admitting: Hematology

## 2017-10-17 ENCOUNTER — Encounter: Payer: Self-pay | Admitting: Hematology

## 2017-10-17 VITALS — BP 131/61 | HR 50 | Temp 98.0°F | Resp 17 | Ht 71.0 in | Wt 234.8 lb

## 2017-10-17 DIAGNOSIS — D696 Thrombocytopenia, unspecified: Secondary | ICD-10-CM

## 2017-10-17 DIAGNOSIS — N281 Cyst of kidney, acquired: Secondary | ICD-10-CM | POA: Diagnosis not present

## 2017-10-17 DIAGNOSIS — E1122 Type 2 diabetes mellitus with diabetic chronic kidney disease: Secondary | ICD-10-CM | POA: Diagnosis not present

## 2017-10-17 DIAGNOSIS — D472 Monoclonal gammopathy: Secondary | ICD-10-CM

## 2017-10-17 DIAGNOSIS — I129 Hypertensive chronic kidney disease with stage 1 through stage 4 chronic kidney disease, or unspecified chronic kidney disease: Secondary | ICD-10-CM | POA: Diagnosis not present

## 2017-10-17 DIAGNOSIS — N183 Chronic kidney disease, stage 3 (moderate): Secondary | ICD-10-CM | POA: Insufficient documentation

## 2017-10-17 LAB — CBC WITH DIFFERENTIAL/PLATELET
Basophils Absolute: 0 10*3/uL (ref 0.0–0.1)
Basophils Relative: 0 %
Eosinophils Absolute: 0.1 10*3/uL (ref 0.0–0.5)
Eosinophils Relative: 2 %
HCT: 43.3 % (ref 38.4–49.9)
Hemoglobin: 14.5 g/dL (ref 13.0–17.1)
Lymphocytes Relative: 19 %
Lymphs Abs: 1 10*3/uL (ref 0.9–3.3)
MCH: 30.7 pg (ref 27.2–33.4)
MCHC: 33.5 g/dL (ref 32.0–36.0)
MCV: 91.7 fL (ref 79.3–98.0)
Monocytes Absolute: 0.6 10*3/uL (ref 0.1–0.9)
Monocytes Relative: 11 %
Neutro Abs: 3.7 10*3/uL (ref 1.5–6.5)
Neutrophils Relative %: 68 %
Platelets: 102 10*3/uL — ABNORMAL LOW (ref 140–400)
RBC: 4.72 MIL/uL (ref 4.20–5.82)
RDW: 13.4 % (ref 11.0–14.6)
WBC: 5.4 10*3/uL (ref 4.0–10.3)

## 2017-10-17 LAB — RETICULOCYTES
RBC.: 4.72 MIL/uL (ref 4.20–5.82)
Retic Count, Absolute: 56.6 10*3/uL (ref 34.8–93.9)
Retic Ct Pct: 1.2 % (ref 0.8–1.8)

## 2017-10-17 LAB — CMP (CANCER CENTER ONLY)
ALT: 32 U/L (ref 0–55)
AST: 22 U/L (ref 5–34)
Albumin: 4.1 g/dL (ref 3.5–5.0)
Alkaline Phosphatase: 45 U/L (ref 40–150)
Anion gap: 6 (ref 3–11)
BUN: 35 mg/dL — ABNORMAL HIGH (ref 7–26)
CO2: 30 mmol/L — ABNORMAL HIGH (ref 22–29)
Calcium: 9.7 mg/dL (ref 8.4–10.4)
Chloride: 105 mmol/L (ref 98–109)
Creatinine: 2.2 mg/dL — ABNORMAL HIGH (ref 0.70–1.30)
GFR, Est AFR Am: 32 mL/min — ABNORMAL LOW (ref >60)
GFR, Estimated: 28 mL/min — ABNORMAL LOW (ref >60)
Glucose, Bld: 92 mg/dL (ref 70–140)
Potassium: 4.5 mmol/L (ref 3.5–5.1)
Sodium: 141 mmol/L (ref 136–145)
Total Bilirubin: 0.9 mg/dL (ref 0.2–1.2)
Total Protein: 7.3 g/dL (ref 6.4–8.3)

## 2017-10-17 LAB — LACTATE DEHYDROGENASE: LDH: 390 U/L — ABNORMAL HIGH (ref 125–245)

## 2017-10-17 LAB — SEDIMENTATION RATE: Sed Rate: 2 mm/hr (ref 0–16)

## 2017-10-18 ENCOUNTER — Telehealth: Payer: Self-pay

## 2017-10-18 LAB — KAPPA/LAMBDA LIGHT CHAINS
Kappa free light chain: 32.7 mg/L — ABNORMAL HIGH (ref 3.3–19.4)
Kappa, lambda light chain ratio: 0.34 (ref 0.26–1.65)
Lambda free light chains: 95 mg/L — ABNORMAL HIGH (ref 5.7–26.3)

## 2017-10-18 LAB — BETA 2 MICROGLOBULIN, SERUM: Beta-2 Microglobulin: 3 mg/L — ABNORMAL HIGH (ref 0.6–2.4)

## 2017-10-18 NOTE — Telephone Encounter (Signed)
Left a detailed message of upcoming appointment. Per 5/14 los also mailed a calender with letter enclosed

## 2017-10-18 NOTE — Telephone Encounter (Signed)
Called and left VM with pt to call Central Scheduling at [phone] in order to schedule appt for bone survey based on availability. Dr. Irene Limbo requested bone survey 1 week out, and explained to pt that it may not be reviewed in time to schedule in one week. Best option to contact CS and schedule.

## 2017-10-23 ENCOUNTER — Ambulatory Visit (HOSPITAL_COMMUNITY)
Admission: RE | Admit: 2017-10-23 | Discharge: 2017-10-23 | Disposition: A | Discharge status: Home / Self Care | Payer: Medicare Other | Source: Ambulatory Visit | Attending: Hematology | Admitting: Hematology

## 2017-10-23 DIAGNOSIS — D472 Monoclonal gammopathy: Secondary | ICD-10-CM

## 2017-10-23 LAB — MULTIPLE MYELOMA PANEL, SERUM
Albumin SerPl Elph-Mcnc: 3.9 g/dL (ref 2.9–4.4)
Albumin/Glob SerPl: 1.3 (ref 0.7–1.7)
Alpha 1: 0.2 g/dL (ref 0.0–0.4)
Alpha2 Glob SerPl Elph-Mcnc: 0.7 g/dL (ref 0.4–1.0)
B-Globulin SerPl Elph-Mcnc: 1 g/dL (ref 0.7–1.3)
Gamma Glob SerPl Elph-Mcnc: 1.2 g/dL (ref 0.4–1.8)
Globulin, Total: 3.2 g/dL (ref 2.2–3.9)
IgA: 104 mg/dL (ref 61–437)
IgG (Immunoglobin G), Serum: 1171 mg/dL (ref 700–1600)
IgM (Immunoglobulin M), Srm: 37 mg/dL (ref 15–143)
M Protein SerPl Elph-Mcnc: 0.9 g/dL — ABNORMAL HIGH
Total Protein ELP: 7.1 g/dL (ref 6.0–8.5)

## 2017-10-27 ENCOUNTER — Ambulatory Visit (INDEPENDENT_AMBULATORY_CARE_PROVIDER_SITE_OTHER): Payer: Medicare Other | Admitting: Family Medicine

## 2017-10-27 ENCOUNTER — Other Ambulatory Visit: Payer: Self-pay

## 2017-10-27 ENCOUNTER — Encounter: Payer: Self-pay | Admitting: Family Medicine

## 2017-10-27 VITALS — BP 124/78 | HR 55 | Temp 97.7°F | Resp 16 | Ht 70.25 in | Wt 230.0 lb

## 2017-10-27 DIAGNOSIS — E119 Type 2 diabetes mellitus without complications: Secondary | ICD-10-CM

## 2017-10-27 DIAGNOSIS — D472 Monoclonal gammopathy: Secondary | ICD-10-CM

## 2017-10-27 DIAGNOSIS — Z0000 Encounter for general adult medical examination without abnormal findings: Secondary | ICD-10-CM | POA: Diagnosis not present

## 2017-10-27 DIAGNOSIS — E785 Hyperlipidemia, unspecified: Secondary | ICD-10-CM

## 2017-10-27 DIAGNOSIS — Z125 Encounter for screening for malignant neoplasm of prostate: Secondary | ICD-10-CM | POA: Diagnosis not present

## 2017-10-27 DIAGNOSIS — I480 Paroxysmal atrial fibrillation: Secondary | ICD-10-CM

## 2017-10-27 NOTE — Addendum Note (Signed)
Addended by: Vonna Kotyk A on: 10/27/2017 12:57 PM   Modules accepted: Orders

## 2017-10-27 NOTE — Progress Notes (Signed)
Subjective:    Patient ID: Martin Cordova, male    DOB: 21-Feb-1941, 77 y.o.   MRN: [id number]  HPI  Patient is a very pleasant 77 year old Caucasian male who is here today for complete physical exam.  Colonoscopy was performed earlier this year was significant for 1 polyp that may have been contributing to his diverticulitis.  Since removal of that polyp, he has had no further episodes of diverticulitis.  He is due for prostate cancer screening.  His chronic kidney disease is recently worsened.  During the work-up for this, he was found to have an M spike on SPEP.  He was referred to oncology.  He has been diagnosed with monoclonal gammopathy of undetermined significance and is being monitored by Dr. Irene Limbo.  Otherwise he is doing well with no concerns.  His blood pressures been running extremely low every morning with systolic blood pressure less than 110.  Heart rate is averaging between 55 and 60.  His renal function has recently deteriorated and his glomerular filtration rate was last estimated to be 28 mL of blood per minute.  He is still taking hydrochlorothiazide. Past Medical History:  Diagnosis Date  . Colon polyps   . Diabetes mellitus without complication ([place])   . Diverticulitis   . Diverticulosis   . GERD 12/07/2006  . HYPERGLYCEMIA 12/07/2006  . HYPERLIPIDEMIA 12/07/2006   mixed wth an atherogenic dyslipidemic pattern  . HYPERTENSION 12/07/2006  . Nephrolithiasis   . Nonalcoholic fatty liver disease   . Normal coronary arteries 04/06/2007   by cath EF 55%., last ECHO 10/13/10 EF>55% mild MR, last nuc 06/16/10 EF 57% low risk scan  . OBESITY 11/10/2009  . OSA treated with BiPAP 10/22/2007  . PAF (paroxysmal atrial fibrillation) ([place]) 04/09/2013  . Palpitations    tachy  . Pulmonary HTN (HCC)    RV syst.  40-27mmHg- moderate by echo  . Renal insufficiency    Cr to 3 on ACE  . Sleep apnea   . SOB (shortness of breath)    with exertion  . THROMBOCYTOPENIA 12/07/2006  . TRANSAMINASES,  SERUM, ELEVATED 12/07/2006  . 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 Claiborne Billings   Past Surgical History:  Procedure Laterality Date  . A-FLUTTER ABLATION N/A 04/19/2017   Procedure: A-FLUTTER ABLATION;  Surgeon: Deboraha Sprang, MD;  Location: [facility];  Service: Cardiovascular;  Laterality: N/A;  . CARDIAC CATHETERIZATION  04/06/2007   normal cors  . CARDIOVERSION N/A 08/05/2013   Procedure: CARDIOVERSION;  Surgeon: Troy Sine, MD;  Location: [facility];  Service: Cardiovascular;  Laterality: N/A;  . CARDIOVERSION N/A 12/26/2016   Procedure: CARDIOVERSION;  Surgeon: Troy Sine, MD;  Location: [facility];  Service: Cardiovascular;  Laterality: N/A;  . CHOLECYSTECTOMY    . CYSTOSCOPY/RETROGRADE/URETEROSCOPY  08/19/2011   Procedure: CYSTOSCOPY/RETROGRADE/URETEROSCOPY;  Surgeon: Hanley Ben, MD;  Location: [facility];  Service: Urology;  Laterality: Right;  JJ STENT PLACEMENT  . ROTATOR CUFF REPAIR Right    Current Outpatient Medications on File Prior to Visit  Medication Sig Dispense Refill  . atorvastatin (LIPITOR) 10 MG tablet TAKE 1 TABLET BY MOUTH AT BEDTIME (Patient taking differently: TAKE 10 MG BY MOUTH AT BEDTIME) 30 tablet 9  . carvedilol (COREG) 25 MG tablet Take 1 tablet (25 mg total) by mouth 2 (two) times daily with a meal. 90 tablet 12  . diltiazem (CARDIZEM CD) 240 MG 24 hr capsule Take 1 capsule (240 mg total) by mouth at  bedtime. 90 capsule 3  . ELIQUIS 5 MG TABS tablet TAKE 1 TABLET BY MOUTH TWICE A DAY 180 tablet 1  . hydrochlorothiazide (HYDRODIURIL) 25 MG tablet Take 1 tablet (25 mg total) by mouth daily. 30 tablet 4  . linagliptin (TRADJENTA) 5 MG TABS tablet Take 5 mg by mouth daily.    . Multiple Vitamins-Minerals (CENTRUM SILVER 50+MEN) TABS Take 1 tablet by mouth daily.     Marland Kitchen omega-3 acid ethyl esters (LOVAZA) 1 g capsule TAKE 2 CAPSULES BY MOUTH EVERY DAY 60 capsule 6  . Oxymetazoline HCl (NASAL SPRAY NA) Place 2  sprays at bedtime as needed into both nostrils (for congestion).    . potassium citrate (UROCIT-K) 10 MEQ (1080 MG) SR tablet Take 10 mEq by mouth 2 (two) times daily.     . raNITIdine HCl (ZANTAC PO) Take by mouth.    . sildenafil (VIAGRA) 100 MG tablet Take 0.5-1 tablets (50-100 mg total) by mouth daily as needed for erectile dysfunction. 5 tablet 11   Current Facility-Administered Medications on File Prior to Visit  Medication Dose Route Frequency Provider Last Rate Last Dose  . 0.9 %  sodium chloride infusion  500 mL Intravenous Once Armbruster, Carlota Raspberry, MD       Allergies  Allergen Reactions  . Lisinopril Swelling and Other (See Comments)    Renal insufficiency also  . Ibuprofen Other (See Comments)    Per the patient, he has "kidney and liver issues" and is NOT suppose to take this   Social History   Socioeconomic History  . Marital status: Married    Spouse name: Not on file  . Number of children: 4  . Years of education: Not on file  . Highest education level: Not on file  Occupational History  . Occupation: retired Tour manager  . Financial resource strain: Not on file  . Food insecurity:    Worry: Not on file    Inability: Not on file  . Transportation needs:    Medical: Not on file    Non-medical: Not on file  Tobacco Use  . Smoking status: Former Smoker    Years: 10.00    Types: Cigarettes    Last attempt to quit: 08/04/1980    Years since quitting: 37.2  . Smokeless tobacco: Former Systems developer    Types: [place] date: 08/08/2004  Substance and Sexual Activity  . Alcohol use: Yes    Alcohol/week: 0.0 oz    Comment: 1 beer 2-3 times per month  . Drug use: No  . Sexual activity: Yes  Lifestyle  . Physical activity:    Days per week: Not on file    Minutes per session: Not on file  . Stress: Not on file  Relationships  . Social connections:    Talks on phone: Not on file    Gets together: Not on file    Attends religious service: Not on file     Active member of club or organization: Not on file    Attends meetings of clubs or organizations: Not on file    Relationship status: Not on file  . Intimate partner violence:    Fear of current or ex partner: Not on file    Emotionally abused: Not on file    Physically abused: Not on file    Forced sexual activity: Not on file  Other Topics Concern  . Not on file  Social History Narrative  . Not on file  Family History  Problem Relation Age of Onset  . Diabetes Mother   . Heart failure Mother   . Stroke Mother   . Kidney failure Father   . Diabetes Father   . Cancer Paternal Uncle   . Colon cancer Neg Hx   . Esophageal cancer Neg Hx   . Stomach cancer Neg Hx   . Rectal cancer Neg Hx      Review of Systems  All other systems reviewed and are negative.      Objective:   Physical Exam  Constitutional: He is oriented to person, place, and time. He appears well-developed and well-nourished. No distress.  HENT:  Head: Normocephalic and atraumatic.  Right Ear: External ear normal.  Left Ear: External ear normal.  Nose: Nose normal.  Mouth/Throat: Oropharynx is clear and moist. No oropharyngeal exudate.  Eyes: Pupils are equal, round, and reactive to light. Conjunctivae and EOM are normal. Right eye exhibits no discharge. Left eye exhibits no discharge. No scleral icterus.  Neck: Normal range of motion. Neck supple. No JVD present. No tracheal deviation present. No thyromegaly present.  Cardiovascular: Normal rate, regular rhythm, normal heart sounds and intact distal pulses. Exam reveals no gallop and no friction rub.  No murmur heard. Pulmonary/Chest: Effort normal and breath sounds normal. No stridor. No respiratory distress. He has no wheezes. He has no rales. He exhibits no tenderness.  Abdominal: Soft. Bowel sounds are normal. He exhibits no distension and no mass. There is no tenderness. There is no rebound and no guarding.  Musculoskeletal: Normal range of motion. He  exhibits no edema or tenderness.  Lymphadenopathy:    He has no cervical adenopathy.  Neurological: He is alert and oriented to person, place, and time. He has normal reflexes. No cranial nerve deficit. He exhibits normal muscle tone. Coordination normal.  Skin: Skin is warm. No rash noted. He is not diaphoretic. No erythema. No pallor.  Psychiatric: He has a normal mood and affect. His behavior is normal. Judgment and thought content normal.  Vitals reviewed.         Assessment & Plan:  Type 2 diabetes mellitus without complication, without long-term current use of insulin (HCC) - Plan: Lipid panel, Microalbumin, urine, Hemoglobin A1c, PSA, Medicare  Prostate cancer screening - Plan: PSA, Medicare  Controlled type 2 diabetes mellitus without complication, without long-term current use of insulin (HCC)  PAF (paroxysmal atrial fibrillation) ([place])  Hyperlipidemia LDL goal <70  General medical exam  MGUS (monoclonal gammopathy of unknown significance)  Immunization History  Administered Date(s) Administered  . Influenza Split 03/06/2012  . Influenza Whole 03/19/2009, 03/06/2010  . Influenza, High Dose Seasonal PF 02/08/2016  . Influenza,inj,Quad PF,6+ Mos 02/23/2013  . Influenza-Unspecified 04/06/2014, 03/28/2015, 04/10/2017  . Pneumococcal Conjugate-13 06/20/2014  . Pneumococcal Polysaccharide-23 06/16/2008  . Td 01/25/1999, 07/06/2009  . Tdap 06/20/2014  . Zoster 02/09/2013   Immunizations are up-to-date.  Cancer screening is up-to-date.  Diabetic foot exam was performed.  Recommended diabetic eye exam.  I will check a fasting lipid panel.  Goal LDL cholesterol is less than 70.  I will check a hemoglobin A1c.  Goal hemoglobin A1c is less than 7.  I will screen the patient for prostate cancer with a PSA.  Given the recent decline in his renal function and his relative hypotension, I have recommended discontinuation of hydrochlorothiazide and monitoring blood pressure closely at  home.  Goal blood pressures less than 130/80.  Patient is currently anticoagulated due to paroxysmal atrial fibrillation  however I recently reviewed his CMP and CBC that was obtained through the oncology office and everything was stable.

## 2017-10-28 LAB — LIPID PANEL
Cholesterol: 99 mg/dL (ref <200)
HDL: 31 mg/dL — ABNORMAL LOW (ref >40)
LDL Cholesterol (Calc): 44 mg/dL (calc)
Non-HDL Cholesterol (Calc): 68 mg/dL (calc) (ref <130)
Total CHOL/HDL Ratio: 3.2 (calc) (ref <5.0)
Triglycerides: 161 mg/dL — ABNORMAL HIGH (ref <150)

## 2017-10-28 LAB — MICROALBUMIN, URINE: Microalb, Ur: 13.8 mg/dL

## 2017-10-28 LAB — HEMOGLOBIN A1C
Hgb A1c MFr Bld: 6.1 % of total Hgb — ABNORMAL HIGH (ref <5.7)
Mean Plasma Glucose: 128 (calc)
eAG (mmol/L): 7.1 (calc)

## 2017-10-28 LAB — PSA: PSA: 1.4 ng/mL (ref <=4.0)

## 2017-10-31 ENCOUNTER — Encounter (INDEPENDENT_AMBULATORY_CARE_PROVIDER_SITE_OTHER): Payer: Self-pay

## 2017-12-18 ENCOUNTER — Ambulatory Visit: Payer: Medicare Other | Admitting: Cardiovascular Disease

## 2017-12-18 ENCOUNTER — Encounter: Payer: Self-pay | Admitting: Cardiovascular Disease

## 2017-12-18 ENCOUNTER — Telehealth: Payer: Self-pay | Admitting: *Deleted

## 2017-12-18 ENCOUNTER — Other Ambulatory Visit: Payer: Self-pay | Admitting: Cardiovascular Disease

## 2017-12-18 VITALS — BP 162/76 | HR 57 | Ht 71.0 in | Wt 235.4 lb

## 2017-12-18 DIAGNOSIS — I480 Paroxysmal atrial fibrillation: Secondary | ICD-10-CM | POA: Diagnosis not present

## 2017-12-18 DIAGNOSIS — I10 Essential (primary) hypertension: Secondary | ICD-10-CM

## 2017-12-18 DIAGNOSIS — E1122 Type 2 diabetes mellitus with diabetic chronic kidney disease: Secondary | ICD-10-CM | POA: Diagnosis not present

## 2017-12-18 DIAGNOSIS — G4733 Obstructive sleep apnea (adult) (pediatric): Secondary | ICD-10-CM | POA: Diagnosis not present

## 2017-12-18 DIAGNOSIS — Z7901 Long term (current) use of anticoagulants: Secondary | ICD-10-CM

## 2017-12-18 NOTE — Progress Notes (Signed)
Patient ID: [name], male   DOB: Oct 27, 1940, 77 y.o.   MRN: [id number]      HPI: Tomislav Micale [place] is a 77 y.o. male is who presents to the office today for a 4 month follow-up cardiology/sleep clinic evaluation.  Mr. 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 In the past, he developed an obstructive uropathy attributed to kidney stones; creatinine had risen up to 3 and ultimately improved and stabilized at approximately 1.7. Mr. Infinger uses his BiPAP daily and does note good sleep.  In the past he had issues with mild peripheral edema, which ultimately improved.  He had been previously taken off his lisinopril when his creatinine had risen in the setting of his obstructive uropathy.  In 2015 he developed recurrent episodes of palpitations and was found to have recurrent paroxysmal atrial fibrillation/flutter.  His medications were adjusted and  he was started on antiarrhythmic therapy with Rythmol to take in addition to his increasing doses of carvedilol.  He was started on Eliquis for anticoagulation.  He underwent successful cardioversion on 08/05/2013.  Since that time, he is unaware of any recurrent atrial fibrillation.  He has noticed more energy. He had been maintained on 50 mg twice a day of carvedilol in addition to Rythmol 225 mg every 8 hours, but due to slow pulse rates, these doses have ultimately been reduced.  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 He has been on CPAP therapy initially for approximately 8 years and has been on BiPAP auto SV for over 4 years. He admits to using his BiPAP with 100% compliance.  He obtained a new BiPAP machine in June.  He feels that his machine is  significantly improved from his prior one. When I last saw him I reviewed his  download of his new BiPAP auto SV machine indicates that 90% of the time is EPAP pressure was 17 and 90% of the time his pressure support was 5.8, giving an IPAP of 21.8 cm.  He is using it 100% of the time and is averaging 5 hours and 49 minutes on his most recent download from 12/07/2013 through 01/05/2014.  His average AHI was 7.5.  His device setting maximum BiPAP pressure is 25 cm in maximum EPAP pressure 21 cm.  His average central event index is 1.3, and average obstructive apneic index 0.2, with an average copy index of 6.0.  Average breath.  Rate is 18 breaths per minute with a minute ventilation of 12.1.  Average total body may 670 mL.  When I saw him on 10/21/15 he was unaware of any arrhythmia.  He was on carvedilol 12.5 mg twice a day, Rythmol 225 mg twice a day, hydralazine 25 mg twice a day and eliquis  5 mg twice a day.  He also has been taking omeprazole for GERD and atorvastatin 10 mg for hyperlipidemia. He was found to have recurrent atrial flutter with variable block.  At that time, I recommended that he increase his Rythmol and take 225 mg every 8 hours and further increased his carvedilol to 18.75 mg twice a day.  Laboratory had revealed a magnesium of 2.0.  He has stage III chronic kidney disease  and his BUN was 26, creatinine 1.71 with a GFR estimate of 39.  Potassium was 4.8.  TSH was normal at 2.4.  Insomnia.  One week later in follow-up he had reverted back to sinus rhythm..  When I saw him in September 2017, he was maintaining sinus rhythm.  Since then, he admits to weight gain.  In early April, he began to notice some increasing shortness of breath and elevated heart rate and fatigue.  He was seen by [name] on 09/07/2016 and was found to be back in atrial flutter with a ventricular rate at 90.  He has been on eloquence 5 g twice a day for anticoagulation and was on carvedilol 18.75 twice a day.   Carvedilol dose was increased to 25 mg twice a day.  He was also told at that time to try increasing his Propofenone to 300 mg 3 times a day.  He took this for several days but did not tolerate the increased dose and reduced it back to its previous dose of 225 mg every 8 hours.  When I saw him on 10/20/2016  I increased carvedilol to 37.5 mg twice a day.  He has continued to use his BiPAP therapy for sleep apnea.    When I saw him in June 2018 he was still in atrial flutter with 3:1 block.  I discontinued hydralazine and started him on Cardizem CD 180 mg, both for blood pressure and potential antiarrhythmic therapy. He also was having ankle edema and I started him on low-dose hydrochlorothiazide.  His peripheral edema significantly improved.  He has felt better.  He denied episodes of chest tightness.  He underwent successful outpatient DC cardioversion by me on 12/26/2016.  He successfully cardioverted at 120 joules.  When he returned for a follow-up evaluation on 01/12/2017 and saw  [name], [facility] .  He was back in atrial flutter.  At that time his propafenone was discontinued.  Over the past several months, Mr. Leverette has felt well.  At times there is some trace swelling in his ankles.  He continues to use his BiPAP with 100% compliance.  He was hospitalized on September 19 with an episode of diverticulitis and was discharged on 02/24/2017.   When I saw him in October 2018, he was back in atrial flutter.  At that time, I recommended consideration for atrial flutter ablation.  He was evaluated by Dr. Caryl Comes and on 04/19/2017 underwent successful atrial flutter ablation across the caval tricuspid isthmus, which interrupted bidirectional conduction.  Initial plan was for him to continue anticoagulation for 4 weeks, but unfortunately he developed irregular tachycardia palpitations about 48 hours after the procedure.  He's been found to have paroxysmal atrial fibrillation.  He as result is on chronic  anticoagulation therapy.  In November 2018 an echo Doppler study showed an EF of 40-45%.  He has been on carvedilol 25 mg twice a day and takes an extra carvedilol as needed.  He has had recurrent short burst of atrial fibrillation, but had an episode for almost an entire day.  He received a new BiPAP machine after his previous machine completely malfunction.  He admits to 100% compliance.  [employer] [facility] [employer] is his DME company.  He has diabetes mellitus and was started back on Tradjenta for hemoglobin A1c of 7.    He has a [employer] BiPAP auto SV unit. I obtained a download from September 02, 2017 through October 01, 2017.  He is compliant.  His average  CPAP pressure was 16.8 and average pressure support pressure was 4.1.  90% of the time device EPAP was 17 cm.  AHI, however, was still mildly increased at 8.9.  He is unaware of breakthrough snoring.  He denies restless legs.  He denies chest pain.  He has noticed some mild episodes of dizziness in the morning.    When I last saw him, I recommended some changes to his BiPAP therapy and change his ramp time to 5 minutes, increased but pressure support to 5.5 increased his EPAP to 9 cm water pressure.  New download was obtained from June 12 through December 14, 2017.  He is 100% compliant and averaging his hours and 24 minutes of CPAP use per night.  His average AHI is still slightly elevated 8.2.  90% of time device CPAP was 17 cm water pressure.  Though his pressure support range can from 0 to 5.5 cm, he apparently has only been needing a pressure support of 2.4 cm of water.  He feels well.  He has a fissure in [place]ell medium cushion full facemask but he is an oral breather and when he opens his mouth the facemask is actually too small which may be contributing to some of his increased AHI.  He presents to sleep clinic for further evaluation.  Of note, he had recently been evaluated by Dr. Jimmy Footman for his renal issues.  He denies any awareness of  recurrent atrial flutter.  He has seen Dr. Dennard Schaumann here he also has been diagnosed with monoclonal gammopathy of undetermined significance and is monitored by Dr. Irene Limbo.  Past Medical History  Diagnosis Date  . GERD 12/07/2006  . HYPERGLYCEMIA 12/07/2006  . HYPERLIPIDEMIA 12/07/2006    mixed wth an atherogenic dyslipidemic pattern  . HYPERTENSION 12/07/2006  . OBESITY 11/10/2009  . THROMBOCYTOPENIA 12/07/2006  . TRANSAMINASES, SERUM, ELEVATED 12/07/2006  . SLEEP APNEA 10/22/2007    uses bipap  . 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 Claiborne Billings  . Diabetes mellitus without complication   . Renal insufficiency     Cr to 3 on ACE  . Nephrolithiasis   . Normal coronary arteries 04/06/2007    by cath EF 55%., last ECHO 10/13/10 EF>55% mild MR, last nuc 06/16/10 EF 57% low risk scan  . Pulmonary HTN     RV syst.  40-55mHg- moderate by echo  . Palpitations     tachy  . PAF (paroxysmal atrial fibrillation) 04/09/2013    Past Surgical History  Procedure Laterality Date  . Cholecystectomy    . Rotator cuff repair    . Cystoscopy/retrograde/ureteroscopy  08/19/2011    Procedure: CYSTOSCOPY/RETROGRADE/URETEROSCOPY;  Surgeon: MHanley Ben MD;  [facility]  Service: Urology;  Laterality: Right;  JJ STENT PLACEMENT   . Cardiac catheterization  04/06/2007    normal cors    Allergies  Allergen Reactions  . Lisinopril     Renal insufficiency    Current Outpatient Medications:  .  atorvastatin (LIPITOR) 10 MG tablet, TAKE 1 TABLET BY MOUTH AT BEDTIME (Patient taking differently: TAKE 10 MG BY MOUTH AT BEDTIME), Disp: 30 tablet, Rfl: 9 .  carvedilol (COREG) 25 MG tablet, Take 1 tablet (25 mg total) by mouth 2 (two) times daily with a meal., Disp: 90 tablet, Rfl: 12 .  diltiazem (CARDIZEM CD) 240 MG 24 hr capsule, Take 1 capsule (240 mg total) by mouth at bedtime., Disp: 90 capsule, Rfl: 3 .  ELIQUIS 5 MG TABS tablet, TAKE 1  TABLET BY MOUTH TWICE A DAY, Disp: 180  tablet, Rfl: 1 .  hydrochlorothiazide (HYDRODIURIL) 25 MG tablet, Take 25 mg by mouth as directed., Disp: , Rfl:  .  linagliptin (TRADJENTA) 5 MG TABS tablet, Take 5 mg by mouth daily., Disp: , Rfl:  .  Multiple Vitamins-Minerals (CENTRUM SILVER 50+MEN) TABS, Take 1 tablet by mouth daily. , Disp: , Rfl:  .  omega-3 acid ethyl esters (LOVAZA) 1 g capsule, TAKE 2 CAPSULES BY MOUTH EVERY DAY, Disp: 60 capsule, Rfl: 6 .  Oxymetazoline HCl (NASAL SPRAY NA), Place 2 sprays at bedtime as needed into both nostrils (for congestion)., Disp: , Rfl:  .  potassium citrate (UROCIT-K) 10 MEQ (1080 MG) SR tablet, Take 10 mEq by mouth 2 (two) times daily. , Disp: , Rfl:  .  raNITIdine HCl (ZANTAC PO), Take by mouth., Disp: , Rfl:  .  sildenafil (VIAGRA) 100 MG tablet, Take 0.5-1 tablets (50-100 mg total) by mouth daily as needed for erectile dysfunction., Disp: 5 tablet, Rfl: 11  Current Facility-Administered Medications:  .  0.9 %  sodium chloride infusion, 500 mL, Intravenous, Once, [place], Carlota Raspberry, MD  Socially he is married has 4 children and 5 grandchildren. He is a distant relative to the "[place]ig[place] brothers." He does try to walk and exercise. There is no tobacco use. He does drink occasional alcohol.   ROS General: Negative; No fevers, chills, or night sweats;  HEENT: Negative; No changes in vision or hearing, sinus congestion, difficulty swallowing Pulmonary: Negative; No cough, wheezing, shortness of breath, hemoptysis Cardiovascular: See history of present illness No recent peripheral edema GI: Status post recent episode of diverticulitis requiring 2 day hospitalization GU:  No dysuria, hematuria, or difficulty voiding; some difficulty with erectile function Musculoskeletal: Negative; no myalgias, joint pain, or weakness Hematologic/Oncology: Positive M spike with monoclonal gammopathy of undetermined significance Endocrine: Negative; no heat/cold intolerance; no diabetes Neuro: Negative; no  changes in balance, headaches Skin: Negative; No rashes or skin lesions Psychiatric: Negative; No behavioral problems, depression Sleep: He is using his BiPAP therapy with 100% compliance.  No snoring, daytime sleepiness, hypersomnolence, bruxism, restless legs, hypnogognic hallucinations, no cataplexy Other comprehensive 14 point system review is negative.  PE BP (!) 162/76   Pulse (!) 57   Ht '5\' 11"'$  (1.803 m)   Wt 235 lb 6.4 oz (106.8 kg)   SpO2 95%   BMI 32.83 kg/m   Repeat blood pressure by me was 142/82  Wt Readings from Last 3 Encounters:  12/18/17 235 lb 6.4 oz (106.8 kg)  10/27/17 230 lb (104.3 kg)  10/17/17 234 lb 12.8 oz (106.5 kg)   General: Alert, oriented, no distress.  Skin: normal turgor, no rashes, warm and dry HEENT: Normocephalic, atraumatic. Pupils equal round and reactive to light; sclera anicteric; extraocular muscles intact;  Nose without nasal septal hypertrophy Mouth/Parynx benign; Mallinpatti scale 3 Neck: No JVD, no carotid bruits; normal carotid upstroke Lungs: clear to ausculatation and percussion; no wheezing or rales Chest wall: without tenderness to palpitation Heart: PMI not displaced, RRR, s1 s2 normal, 1/6 systolic murmur, no diastolic murmur, no rubs, gallops, thrills, or heaves Abdomen: soft, nontender; no hepatosplenomehaly, BS+; abdominal aorta nontender and not dilated by palpation. Back: no CVA tenderness Pulses 2+ Musculoskeletal: full range of motion, normal strength, no joint deformities Extremities: no clubbing cyanosis or edema, Homan's sign negative  Neurologic: grossly nonfocal; Cranial nerves grossly wnl Psychologic: Normal mood and affect   October 03, 2017 ECG (independently read by me):  Sinus bradycardia at 50 bpm.  Right bundle branch block with repolarization changes. QTc interval 461 ms.  January 2019 ECG (independently read by me): Sinus bradycardia 56 bpm.  Possible left atrial enlargement.  Right bundle branch block  with repolarization changes.  PR interval 186 ms; QTc interval 436 ms  October 2018 ECG (independently read by me): Atrial flutter with variable block at 96 bpm.  Right bundle-branch block with repolarization changes.  QTc interval 437 ms  July 2018 ECG (independently read by me): Probable atrial flutter with a ventricular rate at 82 bpm.  Right bundle-branch block, left anterior hemiblock.  11/08/2016 ECG (independently read by me): Probable atrial flutter 3:1 block , ventricular rate at 85;  right bundle branch block with repolarization changes.  QTc interval 528 ms.   May 2018 ECG (independently read by me): atrial flutter with variable block with ventricular rate at 89 bpm.  Right bundle-branch block, left anterior hemiblock.  September 2017 ECG (independently read by me): Normal sinus rhythm at 63 bpm.  First-degree AV block with a PR interval of 208 ms.  Right bundle branch block with repolarization changes.  QTc interval 466 ms.  10/28/15 ECG (independently read by me): Sinus bradycardia at 54 bpm.  First degree block with PR interval 222 ms.  Right bundle-branch block.  10/21/2015 ECG (independently read by me): Atrial flutter with variable block.  Right bundle-branch block with repolarization changes.  October 2016 ECG (independently read by me): sinus rhythm with first-degree AV block with a PR interval at 216 ms.  Ventricular rate 86 bpm with occasional PVCs.    May 2016 ECG (independently read by me): Normal sinus rhythm at 60 bpm.  Right bundle-branch block with repolarization changes.  November 2015 ECG (independently read by me): Normal sinus rhythm at 61.  Nonspecific T abnormality.  QTc interval 394 ms.  11/18/2013 ECG (independently read by me): Sinus bradycardia 58 beats per minute.  PR interval 198 ms; QTc interval 371 ms.  Nonspecific ST changes.  07/29/2013 ECG  (independently read by me): Atrial flutter with 2:1 block with a ventricular rate of 87 beats per minute.  Atrial rate is approximately 360 ms. 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 fib flutter now with right bundle branch block and repolarization changes.  Prior ECG of 06/04/2013: EKG  suggests probable atrial flutter with a ventricular rate of 105 beats per minute. There also are frequent PVCs. QTc interval is 436 ms. They're nonspecific T changes.  LABS: BMP Latest Ref Rng & Units 10/17/2017 07/21/2017 04/10/2017  Glucose 70 - 140 mg/dL 92 - 192(H)  BUN 7 - 26 mg/dL 35(H) - 24  Creatinine 0.70 - 1.30 mg/dL 2.20(H) 1.78(H) 1.71(H)  BUN/Creat Ratio 10 - 24 - - 14  Sodium 136 - 145 mmol/L 141 - 139  Potassium 3.5 - 5.1 mmol/L 4.5 - 3.8  Chloride 98 - 109 mmol/L 105 - 103  CO2 22 - 29 mmol/L 30(H) - 24  Calcium 8.4 - 10.4 mg/dL 9.7 - 9.3   Hepatic Function Latest Ref Rng & Units 10/17/2017 02/22/2017 10/25/2016  Total Protein 6.4 - 8.3 g/dL 7.3 6.6 6.8  Albumin 3.5 - 5.0 g/dL 4.1 3.4(L) 4.3  AST 5 - 34 U/L 22 35 19  ALT 0 - 55 U/L 32 42 25  Alk Phosphatase 40 - 150 U/L 45 44 39(L)  Total Bilirubin 0.2 - 1.2 mg/dL 0.9 2.2(H) 0.8  Bilirubin, Direct 0.0 -  0.3 mg/dL - - -   CBC Latest Ref Rng & Units 10/17/2017 04/10/2017 02/28/2017  WBC 4.0 - 10.3 K/uL 5.4 5.2 7.1  Hemoglobin 13.0 - 17.1 g/dL 14.5 13.5 14.2  Hematocrit 38.4 - 49.9 % 43.3 41.1 41.7  Platelets 140 - 400 K/uL 102(L) 145(L) 159   Lab Results  Component Value Date   TSH 2.40 10/26/2015   Lipid Panel     Component Value Date/Time   CHOL 99 10/27/2017 0900   TRIG 161 (H) 10/27/2017 0900   HDL 31 (L) 10/27/2017 0900   CHOLHDL 3.2 10/27/2017 0900   VLDL 37 (H) 10/25/2016 0853   LDLCALC 44 10/27/2017 0900   LDLDIRECT 65.0 04/12/2013 0825     RADIOLOGY: No results found.  IMPRESSION:  1. OSA (obstructive sleep apnea) on  BiPAP Auto SV   2. PAF (paroxysmal atrial fibrillation) ([place])   3. Essential hypertension   4. Type 2 diabetes mellitus with  chronic kidney disease, without long-term current use of insulin, unspecified CKD stage ([place])   5. Anticoagulation adequate     ASSESSMENT AND PLAN: Mr. Conde is a 77 year old Caucasian male who has documented normal coronary arteries by heart catheterization in 2008. He has a history of hypertension, mixed hyperlipidemia, and has paroxysmal atrial fibrillation/flutter. He has a history of diabetes mellitus and after he had lost a significant amount of weight and with renal insufficiency he was taken off his diabetic medication.  However, due to recurrent weight gain he is now back on therapy with Tradjenta.  Status post successful catheter ablation for recurrent atrial flutter in November 2018.  He continues to be on long-term anticoagulation with Eliquis.  He denies recurrent arrhythmia.  His blood pressure today initially was elevated but improved on repeat assessment on his regimen of carvedilol 25 mg twice a day, diltiazem 240 mg daily, HCTZ 25 mg.  From a sleep perspective, I reviewed his most recent download.  Despite increasing his potential pressure support, he apparently has only utilized an average pressure support of 2.4 documented on his most recent download.  He is requiring high pressures with an average EPAP pressure of 17 which will correspond to an average IPAP pressure of 19.4.  I suspect some of his increased AHI may be due to mask leak since he is a mouth breather and the medium cushion mask is too small and goes into his mouth when he is has oral venting.  As result I am prescribing a new mask with a large cushion.  His DME company is [employer].  He is unaware of any breakthrough snoring.  He denies residual daytime sleepiness.  He will monitor his blood pressure.  Target BP is less than 130/80 and further medication adjustment may be necessary if his BP remains mildly elevated.   Time spent: 25 minutes Shelva Majestic, MD  12/19/2017  10:08 PM

## 2017-12-18 NOTE — Patient Instructions (Signed)
Follow-Up: Your physician wants you to follow-up in: 6 months with Dr. Claiborne Billings (sleep clinic). You will receive a reminder letter in the mail two months in advance. If you don't receive a letter, please call our office to schedule the follow-up appointment.   If you need a refill on your cardiac medications before your next appointment, please call your pharmacy.

## 2017-12-18 NOTE — Telephone Encounter (Signed)
Order placed in Epic for [employer].

## 2017-12-18 NOTE — Telephone Encounter (Signed)
-----   Message from Silverio Lay, RN sent at 12/18/2017 10:48 AM EDT ----- Needs order for [employer] Paykal simplus FF Large mask  Thanks!

## 2017-12-19 ENCOUNTER — Encounter: Payer: Self-pay | Admitting: Cardiovascular Disease

## 2018-01-06 ENCOUNTER — Other Ambulatory Visit: Payer: Self-pay | Admitting: Cardiovascular Disease

## 2018-01-08 ENCOUNTER — Other Ambulatory Visit: Payer: Self-pay | Admitting: Cardiovascular Disease

## 2018-02-14 ENCOUNTER — Other Ambulatory Visit: Payer: Medicare Other

## 2018-02-14 ENCOUNTER — Ambulatory Visit: Payer: Medicare Other | Admitting: Hematology

## 2018-02-27 ENCOUNTER — Ambulatory Visit: Payer: Medicare Other | Admitting: Hematology

## 2018-02-27 ENCOUNTER — Other Ambulatory Visit: Payer: Medicare Other

## 2018-03-04 ENCOUNTER — Other Ambulatory Visit: Payer: Self-pay | Admitting: Cardiovascular Disease

## 2018-03-22 NOTE — Progress Notes (Signed)
Marland Kitchen    HEMATOLOGY/ONCOLOGY CONSULTATION NOTE  Date of Service: 03/23/2018   Patient Care Team: Susy Frizzle, MD as PCP - General (Family Medicine) Troy Sine, MD as PCP - Cardiology (Cardiology)  CHIEF COMPLAINTS/PURPOSE OF CONSULTATION:  Follow up for Thrombocytopenia and MGUS   HISTORY OF PRESENTING ILLNESS:   Martin Cordova is a wonderful 77 y.o. male who has been referred to [place] by Dr Claiborne Billings for evaluation and management of thrombocytopenia.  Patient has a h/o HTN, HLD, DM2, sleep apnea, GERD, CAD, obesity and NASH who was referred for evaluation of thrombocytopenia.  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  Hgb and WBC count have been WNL. No issues with bleeding or bruising, No recent new medications. He is on chronic PPI therapy. He is chronic anticoagulation for his atrial fibrillation.  No other acute new concerns.  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 The patient's last visit with [place] was on 10/17/17. He is accompanied today by his wife. The pt reports that he is doing well overall.   The pt reports that he has not developed any new concerns in the interim. He has not developed any new bone pains, fevers, chills, night sweats, or concerns of infections.   The pt has continued follow up with Dr. Jeneen Rinks Deterding in nephrology, and continues follow up with urology as well.   Of note since the patient's last visit, pt has had a Bone Survey completed on 10/23/17 with results revealing No evidence of multiple myeloma. Multifocal degenerative changes as described.  Lab results today (03/23/18) of CBC w/diff, CMP, and Reticulocytes is as follows: all values are WNL except for PLT at 102k. 03/23/18 MMP and SFLC are pending  On review of systems, pt reports good energy levels, eating well, weight gain, mild ankle swelling, and denies fevers,  chills, night sweats, concerns of infections, new bone pains, pain along the spine, noticing any new lumps or bumps, abdominal pains, leg swelling, and any other symptoms.   MEDICAL HISTORY:  Past Medical History:  Diagnosis Date  . Colon polyps   . Diabetes mellitus without complication ([employer])   . Diverticulitis   . Diverticulosis   . GERD 12/07/2006  . HYPERGLYCEMIA 12/07/2006  . HYPERLIPIDEMIA 12/07/2006   mixed wth an atherogenic dyslipidemic pattern  . HYPERTENSION 12/07/2006  . Nephrolithiasis   . Nonalcoholic fatty liver disease   . Normal coronary arteries 04/06/2007   by cath EF 55%., last ECHO 10/13/10 EF>55% mild MR, last nuc 06/16/10 EF 57% low risk scan  . OBESITY 11/10/2009  . OSA treated with BiPAP 10/22/2007  . PAF (paroxysmal atrial fibrillation) ([place]) 04/09/2013  . Palpitations    tachy  . Pulmonary HTN ([facility])    RV syst.  40-6mHg- moderate by echo  . Renal insufficiency    Cr to 3 on ACE  . Sleep apnea   . SOB (shortness of breath)    with exertion  . THROMBOCYTOPENIA 12/07/2006  . TRANSAMINASES, SERUM, ELEVATED 12/07/2006  . 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 kClaiborne Billings   SURGICAL HISTORY: Past Surgical History:  Procedure Laterality Date  . A-FLUTTER ABLATION N/A 04/19/2017   Procedure: A-FLUTTER ABLATION;  Surgeon: KDeboraha Sprang MD;  Location[facility];  Service: Cardiovascular;  Laterality: N/A;  . CARDIAC CATHETERIZATION  04/06/2007   normal cors  . CARDIOVERSION  N/A 08/05/2013   Procedure: CARDIOVERSION;  Surgeon: Troy Sine, MD;  Location: [place];  Service: Cardiovascular;  Laterality: N/A;  . CARDIOVERSION N/A 12/26/2016   Procedure: CARDIOVERSION;  Surgeon: Troy Sine, MD;  Location[facility] ENDOSCOPY;  Service: Cardiovascular;  Laterality: N/A;  . CHOLECYSTECTOMY    . CYSTOSCOPY/RETROGRADE/URETEROSCOPY  08/19/2011   Procedure: CYSTOSCOPY/RETROGRADE/URETEROSCOPY;  Surgeon: Hanley Ben, MD;  Location: [facility];  Service: Urology;  Laterality: Right;  JJ STENT PLACEMENT  . ROTATOR CUFF REPAIR Right     SOCIAL HISTORY: Social History   Socioeconomic History  . Marital status: Married    Spouse name: Not on file  . Number of children: 4  . Years of education: Not on file  . Highest education level: Not on file  Occupational History  . Occupation: retired Tour manager  . Financial resource strain: Not on file  . Food insecurity:    Worry: Not on file    Inability: Not on file  . Transportation needs:    Medical: Not on file    Non-medical: Not on file  Tobacco Use  . Smoking status: Former Smoker    Years: 10.00    Types: Cigarettes    Last attempt to quit: 08/04/1980    Years since quitting: 37.6  . Smokeless tobacco: Former Systems developer    Types: [place] date: 08/08/2004  Substance and Sexual Activity  . Alcohol use: Yes    Alcohol/week: 0.0 standard drinks    Comment: 1 beer 2-3 times per month  . Drug use: No  . Sexual activity: Yes  Lifestyle  . Physical activity:    Days per week: Not on file    Minutes per session: Not on file  . Stress: Not on file  Relationships  . Social connections:    Talks on phone: Not on file    Gets together: Not on file    Attends religious service: Not on file    Active member of club or organization: Not on file    Attends meetings of clubs or organizations: Not on file    Relationship status: Not on file  . Intimate partner violence:    Fear of current or ex partner: Not on file    Emotionally abused: Not on file    Physically abused: Not on file    Forced sexual activity: Not on file  Other Topics Concern  . Not on file  Social History Narrative  . Not on file    FAMILY HISTORY: Family History  Problem Relation Age of Onset  . Diabetes Mother   . Heart failure Mother   . Stroke Mother   . Kidney failure Father   . Diabetes Father   . Cancer Paternal Uncle   . Colon cancer Neg Hx   . Esophageal cancer Neg Hx     . Stomach cancer Neg Hx   . Rectal cancer Neg Hx     ALLERGIES:  is allergic to lisinopril and ibuprofen.  MEDICATIONS:  Current Outpatient Medications  Medication Sig Dispense Refill  . atorvastatin (LIPITOR) 10 MG tablet TAKE 10 MG BY MOUTH AT BEDTIME 90 tablet 3  . carvedilol (COREG) 25 MG tablet Take 1 tablet (25 mg total) by mouth 2 (two) times daily with a meal. 90 tablet 12  . diltiazem (CARDIZEM CD) 240 MG 24 hr capsule Take 1 capsule (240 mg total) by mouth at bedtime. 90 capsule 3  . ELIQUIS 5  MG TABS tablet TAKE 1 TABLET BY MOUTH TWICE A DAY 180 tablet 0  . hydrochlorothiazide (HYDRODIURIL) 25 MG tablet Take 25 mg by mouth as directed.    . linagliptin (TRADJENTA) 5 MG TABS tablet Take 5 mg by mouth daily.    . Multiple Vitamins-Minerals (CENTRUM SILVER 50+MEN) TABS Take 1 tablet by mouth daily.     Marland Kitchen omega-3 acid ethyl esters (LOVAZA) 1 g capsule TAKE 2 CAPSULES BY MOUTH EVERY DAY 180 capsule 0  . Oxymetazoline HCl (NASAL SPRAY NA) Place 2 sprays at bedtime as needed into both nostrils (for congestion).    . potassium citrate (UROCIT-K) 10 MEQ (1080 MG) SR tablet Take 10 mEq by mouth 2 (two) times daily.     . raNITIdine HCl (ZANTAC PO) Take by mouth.    . sildenafil (VIAGRA) 100 MG tablet Take 0.5-1 tablets (50-100 mg total) by mouth daily as needed for erectile dysfunction. 5 tablet 11   Current Facility-Administered Medications  Medication Dose Route Frequency Provider Last Rate Last Dose  . 0.9 %  sodium chloride infusion  500 mL Intravenous Once Armbruster, Carlota Raspberry, MD        REVIEW OF SYSTEMS:  A 10+ POINT REVIEW OF SYSTEMS WAS OBTAINED including neurology, dermatology, psychiatry, cardiac, respiratory, lymph, extremities, GI, GU, Musculoskeletal, constitutional, breasts, reproductive, HEENT.  All pertinent positives are noted in the HPI.  All others are negative.   PHYSICAL EXAMINATION: ECOG PERFORMANCE STATUS: 1 - Symptomatic but completely ambulatory  Filed  Weights   03/23/18 0929  Weight: 240 lb 9.6 oz (109.1 kg)   .Body mass index is 33.56 kg/m.  GENERAL:alert, in no acute distress and comfortable SKIN: no acute rashes, no significant lesions EYES: conjunctiva are pink and non-injected, sclera anicteric OROPHARYNX: MMM, no exudates, no oropharyngeal erythema or ulceration NECK: supple, no JVD LYMPH:  no palpable lymphadenopathy in the cervical, axillary or inguinal regions LUNGS: clear to auscultation b/l with normal respiratory effort HEART: regular rate & rhythm ABDOMEN:  normoactive bowel sounds , non tender, not distended. Borderline palpable splenomegaly.  Extremity: 1+ pedal edema PSYCH: alert & oriented x 3 with fluent speech NEURO: no focal motor/sensory deficits   LABORATORY DATA:  I have reviewed the data as listed  Component     Latest Ref Rng & Units 11/16/2015  WBC     4.0 - 10.3 10e3/uL 4.8  NEUT#     1.5 - 6.5 10e3/uL 3.2  Hemoglobin     13.0 - 17.1 g/dL 14.0  HCT     38.4 - 49.9 % 41.2  Platelets     140 - 400 10e3/uL 100 (L)  MCV     79.3 - 98.0 fL 91.6  MCH     27.2 - 33.4 pg 31.1  MCHC     32.0 - 36.0 g/dL 34.0  RBC     4.20 - 5.82 10e6/uL 4.50  RDW     11.0 - 14.6 % 13.2  lymph#     0.9 - 3.3 10e3/uL 1.0  MONO#     0.1 - 0.9 10e3/uL 0.5  Eosinophils Absolute     0.0 - 0.5 10e3/uL 0.1  Basophils Absolute     0.0 - 0.1 10e3/uL 0.0  NEUT%     39.0 - 75.0 % 66.0  LYMPH%     14.0 - 49.0 % 21.3  MONO%     0.0 - 14.0 % 10.0  EOS%     0.0 - 7.0 % 2.3  BASO%  0.0 - 2.0 % 0.4  Retic %     0.80 - 1.80 % 1.40  Retic Ct Abs     34.80 - 93.90 10e3/uL 63.00  Immature Retic Fract     3.00 - 10.60 % 5.20  Sodium     136 - 145 mEq/L 143  Potassium     3.5 - 5.1 mEq/L 4.8  Chloride     98 - 109 mEq/L 109  CO2     22 - 29 mEq/L 30 (H)  Glucose     70 - 140 mg/dl 85  BUN     7.0 - 26.0 mg/dL 22.6  Creatinine     0.7 - 1.3 mg/dL 1.7 (H)  Total Bilirubin     0.20 - 1.20 mg/dL 0.88    Alkaline Phosphatase     40 - 150 U/L 38 (L)  AST     5 - 34 U/L 17  ALT     0 - 55 U/L 24  Total Protein     6.4 - 8.3 g/dL 7.0  Albumin     3.5 - 5.0 g/dL 3.8  Calcium     8.4 - 10.4 mg/dL 9.5  Anion gap     3 - 11 mEq/L 4  EGFR     >90 ml/min/1.73 m2 40 (L)  HCV Ab     0.0 - 0.9 s/co ratio <0.1  Comment      Comment  HIV     Non Reactive Non Reactive  Vitamin B12     211 - 946 pg/mL 583  . CBC Latest Ref Rng & Units 03/23/2018 10/17/2017 04/10/2017  WBC 4.0 - 10.5 K/uL 4.7 5.4 5.2  Hemoglobin 13.0 - 17.0 g/dL 13.5 14.5 13.5  Hematocrit 39.0 - 52.0 % 40.9 43.3 41.1  Platelets 150 - 400 K/uL 102(L) 102(L) 145(L)     . CBC    Component Value Date/Time   WBC 4.7 03/23/2018 0825   RBC 4.47 03/23/2018 0825   RBC 4.47 03/23/2018 0825   HGB 13.5 03/23/2018 0825   HGB 13.5 04/10/2017 0854   HGB 14.2 05/17/2016 0756   HCT 40.9 03/23/2018 0825   HCT 41.1 04/10/2017 0854   HCT 42.5 05/17/2016 0756   PLT 102 (L) 03/23/2018 0825   PLT 145 (L) 04/10/2017 0854   MCV 91.5 03/23/2018 0825   MCV 91 04/10/2017 0854   MCV 92.4 05/17/2016 0756   MCH 30.2 03/23/2018 0825   MCHC 33.0 03/23/2018 0825   RDW 13.2 03/23/2018 0825   RDW 14.8 04/10/2017 0854   RDW 12.8 05/17/2016 0756   LYMPHSABS 0.7 03/23/2018 0825   LYMPHSABS 0.8 04/10/2017 0854   LYMPHSABS 1.1 05/17/2016 0756   MONOABS 0.3 03/23/2018 0825   MONOABS 0.7 05/17/2016 0756   EOSABS 0.1 03/23/2018 0825   EOSABS 0.2 04/10/2017 0854   BASOSABS 0.0 03/23/2018 0825   BASOSABS 0.0 04/10/2017 0854   BASOSABS 0.0 05/17/2016 0756    . CMP Latest Ref Rng & Units 03/23/2018 10/17/2017 07/21/2017  Glucose 70 - 99 mg/dL 171(H) 92 -  BUN 8 - 23 mg/dL 19 35(H) -  Creatinine 0.61 - 1.24 mg/dL 1.77(H) 2.20(H) 1.78(H)  Sodium 135 - 145 mmol/L 141 141 -  Potassium 3.5 - 5.1 mmol/L 4.1 4.5 -  Chloride 98 - 111 mmol/L 107 105 -  CO2 22 - 32 mmol/L 25 30(H) -  Calcium 8.9 - 10.3 mg/dL 9.6 9.7 -  Total Protein 6.5 - 8.1 g/dL  7.2  7.3 -  Total Bilirubin 0.3 - 1.2 mg/dL 0.9 0.9 -  Alkaline Phos 38 - 126 U/L 43 45 -  AST 15 - 41 U/L 18 22 -  ALT 0 - 44 U/L 22 32 -   OUTSIDE LABS from [facility] 08/07/17       RADIOGRAPHIC STUDIES: I have personally reviewed the radiological images as listed and agreed with the findings in the report.  US Renal 08/10/17  IMPRESSION: Multiple renal cysts of varying sizes are noted bilaterally consistent with polycystic kidney disease. Increased echogenicity of renal parenchyma is noted consistent with medical renal disease. No hydronephrosis or renal obstruction is noted   ABDOMEN ULTRASOUND COMPLETE  COMPARISON:  CT urogram of August 15, 2011 and a right upper quadrant ultrasound of September 23, 2008  FINDINGS: Gallbladder: The gallbladder is surgically absent.  Common bile duct: Diameter: 5 mm where visualized  Liver: Bowel gas obscures the left hepatic lobe. The echotexture of the liver is somewhat heterogeneously increased. There is no discrete mass or ductal dilation.  IVC: Largely obscured by bowel gas.  Pancreas: Obscured by bowel gas.  Spleen: Normal in size and echotexture  Right Kidney: Length: 11.3 cm. The renal cortical echotexture is increased. There are multiple cysts in the right kidney with the largest measuring 5.6 x 4.3 x 4.2 cm. No hydronephrosis is observed.  Left Kidney: Length: 21 cm. The renal cortical echotexture is increased. There are multiple cysts with the largest measuring 9.1 x 6.5 x 9.2 cm.  Abdominal aorta: No aneurysm visualized. Limited visualization due to bowel gas.  Other findings: None.  IMPRESSION: 1. Limited visualization of the liver. The left lobe is not well visualized. The hepatic echotexture is heterogeneously increased without evidence of a discrete mass. The gallbladder is surgically absent. 2. Large kidneys with increased parenchymal echotexture and multiple large cysts. These findings have  been demonstrated in the past. 3. Limited visualization of the CBD, IVC, abdominal aorta, and pancreas due to bowel gas and the patient's body habitus.   .No results found.    ASSESSMENT & PLAN:   77 y.o. caucasian male with multiple medical co-morbidities as noted above with   1) Chronic mild Isolated thrombocytopenia ( plt 90-110k).  Patient thrombocytopenia hasn't changed significantly since 2014. 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 Medication effect could be an additional element (propafenone, ppi ) Cannot r/o an immune component. No evidence of pseudothrombocytopenia on PBS. 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 24 urine showed M-Protein positive in his urine at 0.9g/mL Component     Latest Ref Rng & Units 10/17/2017  IgG (Immunoglobin G), Serum     700 - 1,600 mg/dL 1,171  IgA     61 - 437 mg/dL 104  IgM (Immunoglobulin M), Srm     15 - 143 mg/dL 37  Total Protein ELP     6.0 - 8.5 g/dL 7.1  Albumin SerPl Elph-Mcnc     2.9 - 4.4 g/dL 3.9  Alpha 1     0.0 - 0.4 g/dL 0.2  Alpha2 Glob SerPl Elph-Mcnc     0.4 - 1.0 g/dL 0.7  B-Globulin SerPl Elph-Mcnc     0.7 - 1.3 g/dL 1.0  Gamma Glob SerPl Elph-Mcnc     0.4 - 1.8 g/dL 1.2  M Protein SerPl Elph-Mcnc  Not Observed g/dL 0.9 (H)  Globulin, Total     2.2 - 3.9 g/dL 3.2  Albumin/Glob SerPl     0.7 - 1.7 1.3  IFE 1      Comment  Please Note (HCV):      Comment  Kappa free light chain     3.3 - 19.4 mg/L 32.7 (H)  Lamda free light chains     5.7 - 26.3 mg/L 95.0 (H)  Kappa, lamda light chain ratio     0.26 - 1.65 0.34  Sed Rate     0 - 16 mm/hr 2  Beta-2 Microglobulin     0.6 - 2.4 mg/L 3.0 (H)  LDH     125 - 245 U/L 390 (H)   Neg bone survey.  3) CKD Stage III, nephrolithiasis, Polycystic Kidneys -Pt's father died from renal  failure in his 57s.  -cysts were seen on 08/10/17 US renal  -He will continue to be seen by urologist who suggested he go on a low salt and low sugar diet.    PLAN  -Previously discussed his risk for his MGUS progressing to a blood cancer or amyloidosis. We would watch for CRAB criteria or large amounts of protein in the urine while obseving his MGUS.  -Ok to continue therapeutic anticoagulation at these platelet level. Would need to be careful if platelets drop to <=50k. -Discussed pt labwork today, 03/23/18; PLT are stable at 102k and other blood counts are stable, Creatinine improved to 1.77, chemistries are stable  -Discussed suspicion that patient's hyperactive spleen due to fatty liver as cause for stable thrombocytopenia -03/23/18 MMP and SFLC are pending, will follow up with pt with results  -Discussed the 10/23/17 Bone Survey which revealed No evidence of multiple myeloma. Multifocal degenerative changes as described. -Discussed CRAB criteria. Calcium remains normal. Creatinine improved to 1.77. No anemia with HGB at 13.5. No bone tumors as seen from 10/23/17 Bone Survey. -Discussed that the only remaining evaluation is a BM Bx, which is an option but not strongly indicated in the absence of CRAB criteria, though M Protein today is pending. Pt prefers to continue watching labs, which is quite reasonable at this time.  -Continue follow up with urology and nephrology  -Pt remains stable clinically and with labs -Will see the pt back in 6 months   RTC with dr Irene Limbo in 6 months with labs (labs 1 week prior to clinic visit if possible so results will be available)   All of the patients questions were answered with apparent satisfaction. The patient knows to call the clinic with any problems, questions or concerns.  The total time spent in the appt was 20 minutes and more than 50% was on counseling and direct patient cares.    Sullivan Lone MD MS AAHIVMS [facility] Hematology/Oncology  Physician [facility]  (Office):       [phone] (Work cell):  [phone] (Fax):           [phone]  I, Baldwin Jamaica, am acting as a scribe for Dr. Irene Limbo  .I have reviewed the above documentation for accuracy and completeness, and I agree with the above. Brunetta Genera MD

## 2018-03-23 ENCOUNTER — Telehealth: Payer: Self-pay | Admitting: Hematology

## 2018-03-23 ENCOUNTER — Inpatient Hospital Stay: Payer: Medicare Other | Admitting: Hematology

## 2018-03-23 ENCOUNTER — Encounter (INDEPENDENT_AMBULATORY_CARE_PROVIDER_SITE_OTHER): Payer: Self-pay

## 2018-03-23 ENCOUNTER — Inpatient Hospital Stay: Payer: Medicare Other | Attending: Hematology

## 2018-03-23 VITALS — BP 136/66 | HR 59 | Temp 97.9°F | Resp 18 | Ht 71.0 in | Wt 240.6 lb

## 2018-03-23 DIAGNOSIS — I129 Hypertensive chronic kidney disease with stage 1 through stage 4 chronic kidney disease, or unspecified chronic kidney disease: Secondary | ICD-10-CM | POA: Insufficient documentation

## 2018-03-23 DIAGNOSIS — D696 Thrombocytopenia, unspecified: Secondary | ICD-10-CM | POA: Diagnosis not present

## 2018-03-23 DIAGNOSIS — Z79899 Other long term (current) drug therapy: Secondary | ICD-10-CM | POA: Diagnosis not present

## 2018-03-23 DIAGNOSIS — E1122 Type 2 diabetes mellitus with diabetic chronic kidney disease: Secondary | ICD-10-CM | POA: Diagnosis not present

## 2018-03-23 DIAGNOSIS — N183 Chronic kidney disease, stage 3 (moderate): Secondary | ICD-10-CM

## 2018-03-23 DIAGNOSIS — D472 Monoclonal gammopathy: Secondary | ICD-10-CM | POA: Insufficient documentation

## 2018-03-23 DIAGNOSIS — Z87891 Personal history of nicotine dependence: Secondary | ICD-10-CM | POA: Diagnosis not present

## 2018-03-23 LAB — CMP (CANCER CENTER ONLY)
ALT: 22 U/L (ref 0–44)
AST: 18 U/L (ref 15–41)
Albumin: 3.9 g/dL (ref 3.5–5.0)
Alkaline Phosphatase: 43 U/L (ref 38–126)
Anion gap: 9 (ref 5–15)
BUN: 19 mg/dL (ref 8–23)
CO2: 25 mmol/L (ref 22–32)
Calcium: 9.6 mg/dL (ref 8.9–10.3)
Chloride: 107 mmol/L (ref 98–111)
Creatinine: 1.77 mg/dL — ABNORMAL HIGH (ref 0.61–1.24)
GFR, Est AFR Am: 42 mL/min — ABNORMAL LOW (ref >60)
GFR, Estimated: 36 mL/min — ABNORMAL LOW (ref >60)
Glucose, Bld: 171 mg/dL — ABNORMAL HIGH (ref 70–99)
Potassium: 4.1 mmol/L (ref 3.5–5.1)
Sodium: 141 mmol/L (ref 135–145)
Total Bilirubin: 0.9 mg/dL (ref 0.3–1.2)
Total Protein: 7.2 g/dL (ref 6.5–8.1)

## 2018-03-23 LAB — CBC WITH DIFFERENTIAL/PLATELET
Abs Immature Granulocytes: 0.01 10*3/uL (ref 0.00–0.07)
Basophils Absolute: 0 10*3/uL (ref 0.0–0.1)
Basophils Relative: 0 %
Eosinophils Absolute: 0.1 10*3/uL (ref 0.0–0.5)
Eosinophils Relative: 3 %
HCT: 40.9 % (ref 39.0–52.0)
Hemoglobin: 13.5 g/dL (ref 13.0–17.0)
Immature Granulocytes: 0 %
Lymphocytes Relative: 16 %
Lymphs Abs: 0.7 10*3/uL (ref 0.7–4.0)
MCH: 30.2 pg (ref 26.0–34.0)
MCHC: 33 g/dL (ref 30.0–36.0)
MCV: 91.5 fL (ref 80.0–100.0)
Monocytes Absolute: 0.3 10*3/uL (ref 0.1–1.0)
Monocytes Relative: 6 %
Neutro Abs: 3.5 10*3/uL (ref 1.7–7.7)
Neutrophils Relative %: 75 %
Platelets: 102 10*3/uL — ABNORMAL LOW (ref 150–400)
RBC: 4.47 MIL/uL (ref 4.22–5.81)
RDW: 13.2 % (ref 11.5–15.5)
WBC: 4.7 10*3/uL (ref 4.0–10.5)
nRBC: 0 % (ref 0.0–0.2)

## 2018-03-23 LAB — RETICULOCYTES
Immature Retic Fract: 13.9 % (ref 2.3–15.9)
RBC.: 4.47 MIL/uL (ref 4.22–5.81)
Retic Count, Absolute: 74.6 10*3/uL (ref 19.0–186.0)
Retic Ct Pct: 1.7 % (ref 0.4–3.1)

## 2018-03-23 NOTE — Telephone Encounter (Signed)
Scheduled appt per 10/18 los - gave patient AVS and calender per los.  

## 2018-03-26 LAB — MULTIPLE MYELOMA PANEL, SERUM
Albumin SerPl Elph-Mcnc: 3.8 g/dL (ref 2.9–4.4)
Albumin/Glob SerPl: 1.4 (ref 0.7–1.7)
Alpha 1: 0.2 g/dL (ref 0.0–0.4)
Alpha2 Glob SerPl Elph-Mcnc: 0.7 g/dL (ref 0.4–1.0)
B-Globulin SerPl Elph-Mcnc: 0.8 g/dL (ref 0.7–1.3)
Gamma Glob SerPl Elph-Mcnc: 1.1 g/dL (ref 0.4–1.8)
Globulin, Total: 2.8 g/dL (ref 2.2–3.9)
IgA: 96 mg/dL (ref 61–437)
IgG (Immunoglobin G), Serum: 1156 mg/dL (ref 700–1600)
IgM (Immunoglobulin M), Srm: 39 mg/dL (ref 15–143)
M Protein SerPl Elph-Mcnc: 0.9 g/dL — ABNORMAL HIGH
Total Protein ELP: 6.6 g/dL (ref 6.0–8.5)

## 2018-03-26 LAB — KAPPA/LAMBDA LIGHT CHAINS
Kappa free light chain: 30.8 mg/L — ABNORMAL HIGH (ref 3.3–19.4)
Kappa, lambda light chain ratio: 0.39 (ref 0.26–1.65)
Lambda free light chains: 78.1 mg/L — ABNORMAL HIGH (ref 5.7–26.3)

## 2018-04-05 ENCOUNTER — Other Ambulatory Visit: Payer: Self-pay | Admitting: Cardiovascular Disease

## 2018-04-06 ENCOUNTER — Other Ambulatory Visit: Payer: Self-pay | Admitting: Cardiovascular Disease

## 2018-06-22 ENCOUNTER — Other Ambulatory Visit: Payer: Self-pay | Admitting: Cardiovascular Disease

## 2018-06-30 IMAGING — MR MR CARD MORPHOLOGY WO/W CM
8 of 9 series · 38 of 40 positions shown · IV contrast (34    MH)
Comparison: none

CLINICAL DATA: R/O HOCM

EXAM:
CARDIAC MRI
TECHNIQUE: The patient was scanned on a 1.5 Tesla GE magnet. A dedicated
cardiac coil was used. Functional imaging was done using Fiesta
sequences. [DATE], and 4 chamber views were done to assess for RWMA's.
Modified [place] rule using a short axis stack was used to
calculate an ejection fraction on a dedicated work station using
Circle software. The patient received 35 cc cc of Multihance. After
10 minutes inversion recovery sequences were used to assess for
infiltration and scar tissue.
CONTRAST:  35 cc Multhihance

[Series 3: bSSFP · sagittal · 8.0mm · 1.48mm/px · 1 of 18 slices shown (1 of 5)]
[im 1/18]
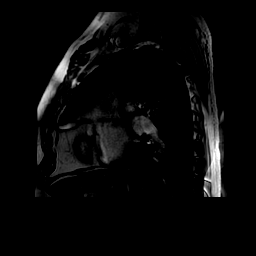

[Series 4: bSSFP · axial · 8.0mm · 1.37mm/px · 1 of 20 slices shown (2 of 5)]
[im 1/20]
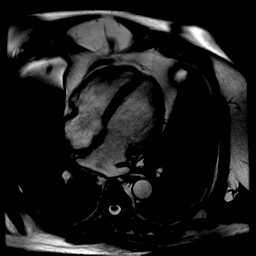

[Series 5: bSSFP · oblique · 8.0mm · 1.41mm/px · 19 of 320 slices shown (3 of 5)]
[im 1/320]
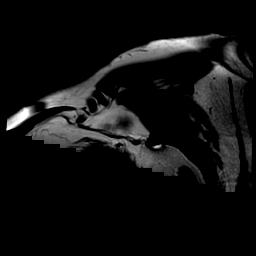
[im 18/320]
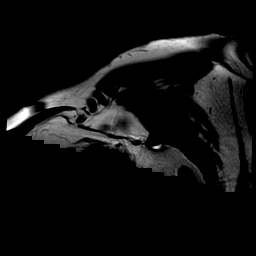
[im 36/320]
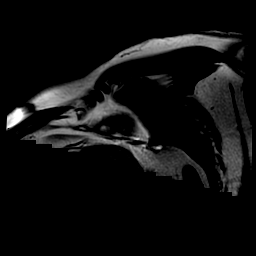
[im 54/320]
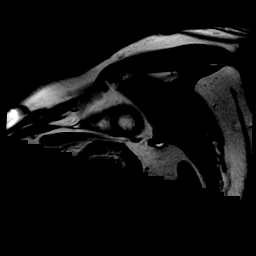
[im 71/320]
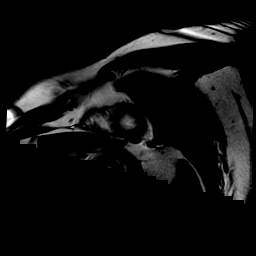
[im 89/320]
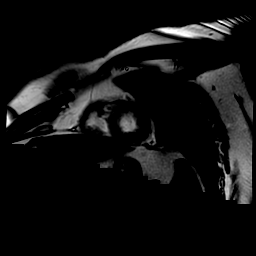
[im 107/320]
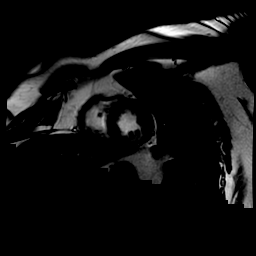
[im 125/320]
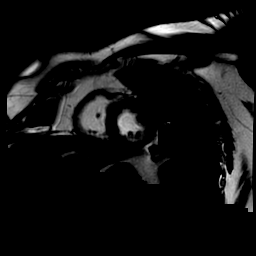
[im 142/320]
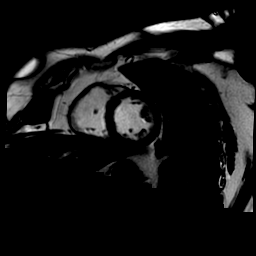
[im 160/320]
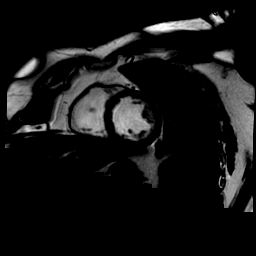
[im 178/320]
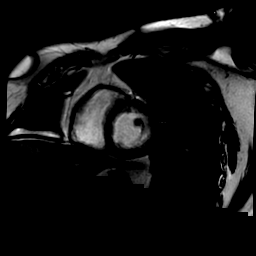
[im 195/320]
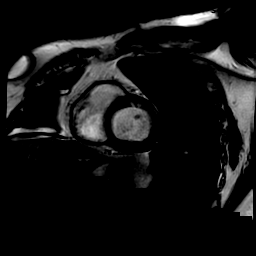
[im 213/320]
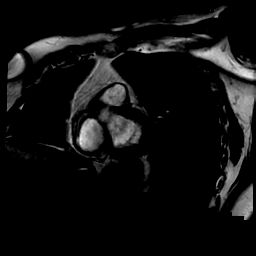
[im 231/320]
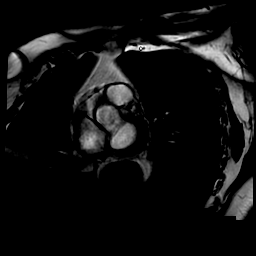
[im 249/320]
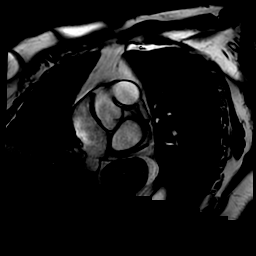
[im 266/320]
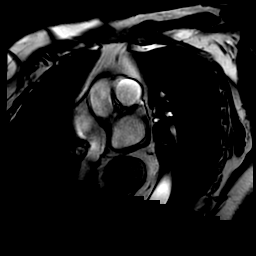
[im 284/320]
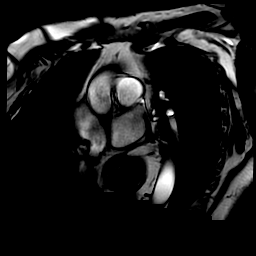
[im 302/320]
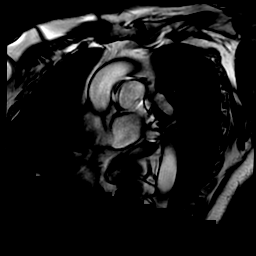
[im 320/320]
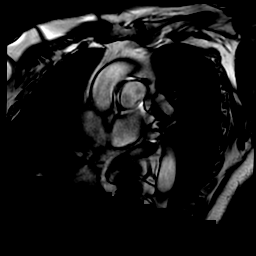

[Series 13: bSSFP · axial · 8.0mm · 1.56mm/px · z∈[-145,+56]mm · 4 of 60 slices shown (4 of 5)]
[im 1/60]
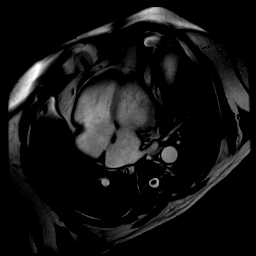
[im 20/60]
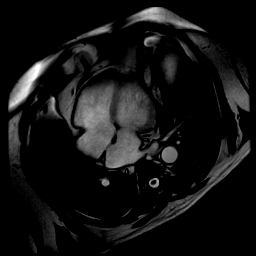
[im 40/60]
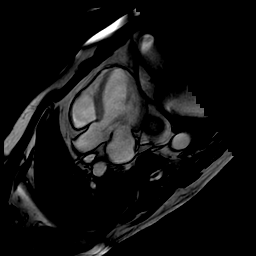
[im 60/60]
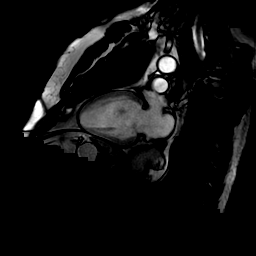

[Series 16: bSSFP · oblique · 6.0mm · 1.56mm/px · 10 of 160 slices shown (5 of 5)]
[im 1/160]
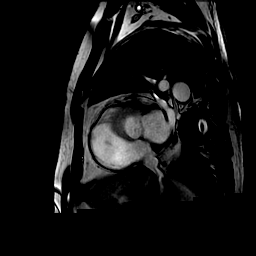
[im 18/160]
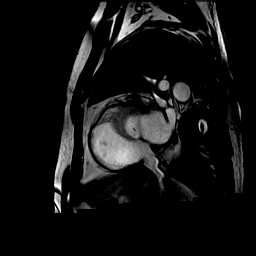
[im 36/160]
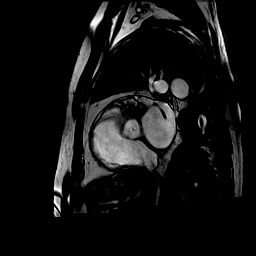
[im 54/160]
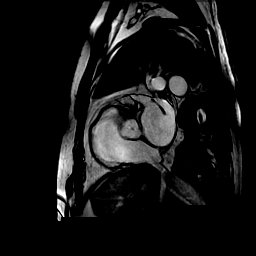
[im 71/160]
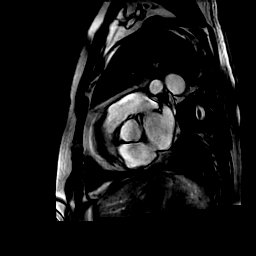
[im 89/160]
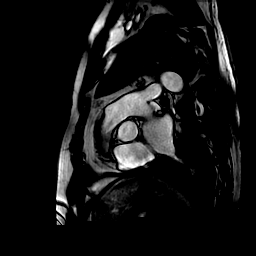
[im 107/160]
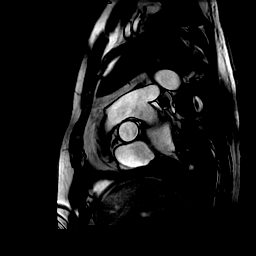
[im 124/160]
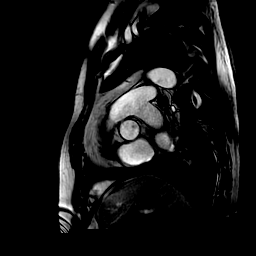
[im 142/160]
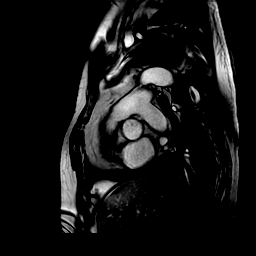
[im 160/160]
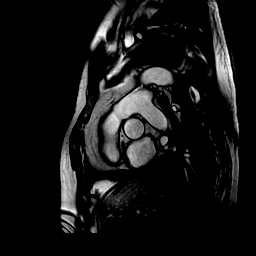

[Series 21: delayed ir prep · oblique · 8.0mm · 1.52mm/px · 1 of 6 slices shown (1 of 2)]
[im 1/6]
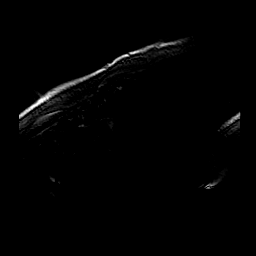

[Series 22: rad mde · axial · 8.0mm · 1.56mm/px · 1 of 5 slices shown]
[im 1/5]
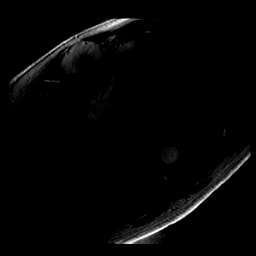

[Series 24: delayed ir prep · oblique · 8.0mm · 1.52mm/px · 1 of 8 slices shown (2 of 2)]
[im 1/8]
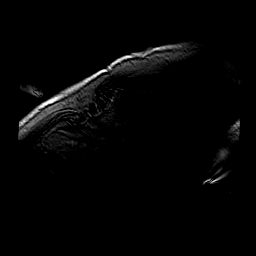

[38 of 40 positions shown; findings below may reference images not displayed]

FINDINGS: There was moderate biatrial enlargement. There was moderate RV
enlargement. There was moderate LVH of the mid and basal septum 14
mm compared to posterior wall 9 mm. The EF was normal 54%

(EDV 134 cc ESV 61 cc SV 73 cc) The AV was tri leaflet with mild AR.
The MV had no prolapse with mild MR. Delayed enhancement images
showed no gadolinium uptake and no evidence of scar, infiltration or
hypertrophic cardiomyopathy. There was no NEMANJA
IMPRESSION: 1.  Normal LV size and function EF 54%

2.  No delayed gadolinium uptake on inversion recovery sequences

3.  Tri leaflet AV with mild AR

4.  Mild MR

5.  Moderate bi atrial enlargement

6.  Moderate RV enlargement

7. Moderate LVH mid and basal septum 14 mm compared to posterior
wall 9 mm

Jhemboy Padam

## 2018-07-05 ENCOUNTER — Other Ambulatory Visit: Payer: Self-pay | Admitting: Cardiovascular Disease

## 2018-07-09 ENCOUNTER — Telehealth: Payer: Self-pay | Admitting: Family Medicine

## 2018-07-09 ENCOUNTER — Other Ambulatory Visit: Payer: Self-pay | Admitting: Family Medicine

## 2018-07-09 MED ORDER — OSELTAMIVIR PHOSPHATE 75 MG PO CAPS: 75.0000 mg | ORAL_CAPSULE | Freq: Two times a day (BID) | ORAL | 0 refills | Status: DC

## 2018-07-09 NOTE — Telephone Encounter (Signed)
Pt keeps his granddaughter during the week for their child to work and the granddaughter has been dx with type a flu. Want tamiflu called in to [address]

## 2018-07-09 NOTE — Telephone Encounter (Signed)
Pt aware via Dr. Dennard Schaumann

## 2018-07-09 NOTE — Telephone Encounter (Signed)
Ok will do.

## 2018-07-23 ENCOUNTER — Other Ambulatory Visit: Payer: Self-pay | Admitting: Cardiovascular Disease

## 2018-07-31 ENCOUNTER — Ambulatory Visit: Payer: Medicare Other | Admitting: Cardiovascular Disease

## 2018-07-31 ENCOUNTER — Encounter: Payer: Self-pay | Admitting: Cardiovascular Disease

## 2018-07-31 VITALS — BP 158/76 | HR 56 | Ht 71.0 in | Wt 243.6 lb

## 2018-07-31 DIAGNOSIS — I517 Cardiomegaly: Secondary | ICD-10-CM

## 2018-07-31 DIAGNOSIS — Z7901 Long term (current) use of anticoagulants: Secondary | ICD-10-CM

## 2018-07-31 DIAGNOSIS — I480 Paroxysmal atrial fibrillation: Secondary | ICD-10-CM | POA: Diagnosis not present

## 2018-07-31 DIAGNOSIS — E1122 Type 2 diabetes mellitus with diabetic chronic kidney disease: Secondary | ICD-10-CM

## 2018-07-31 DIAGNOSIS — G4733 Obstructive sleep apnea (adult) (pediatric): Secondary | ICD-10-CM

## 2018-07-31 DIAGNOSIS — I10 Essential (primary) hypertension: Secondary | ICD-10-CM

## 2018-07-31 MED ORDER — CARVEDILOL 25 MG PO TABS: 25.0000 mg | ORAL_TABLET | Freq: Two times a day (BID) | ORAL | 0 refills | Status: DC

## 2018-07-31 NOTE — Patient Instructions (Signed)
Medication Instructions:  The current medical regimen is effective;  continue present plan and medications.  If you need a refill on your cardiac medications before your next appointment, please call your pharmacy.    Testing/Procedures: Echocardiogram (in 6 months) - Your physician has requested that you have an echocardiogram. Echocardiography is a painless test that uses sound waves to create images of your heart. It provides your doctor with information about the size and shape of your heart and how well your heart's chambers and valves are working. This procedure takes approximately one hour. There are no restrictions for this procedure. This will be performed at our [facility] location - [address], [address].   Follow-Up: At [facility], you and your health needs are our priority.  As part of our continuing mission to provide you with exceptional heart care, we have created designated Provider Care Teams.  These Care Teams include your primary Cardiologist (physician) and Advanced Practice Providers (APPs -  Physician Assistants and Nurse Practitioners) who all work together to provide you with the care you need, when you need it. You will need a follow up appointment in 6 months after ECHO.  Please call our office 2 months in advance to schedule this appointment.  You may see Shelva Majestic, MD or one of the following Advanced Practice Providers on your designated Care Team: [place], [place] . Fabian Sharp, PA-C

## 2018-08-02 ENCOUNTER — Encounter: Payer: Self-pay | Admitting: Cardiovascular Disease

## 2018-08-02 NOTE — Progress Notes (Signed)
Patient ID: Martin Cordova, male   DOB: Oct 27, 1940, 78 y.o.   MRN: [id number]      HPI: Martin Cordova is a 78 y.o. male is who presents to the office today for a 4 month follow-up cardiology/sleep clinic evaluation.  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 In the past, he developed an obstructive uropathy attributed to kidney stones; creatinine had risen up to 3 and ultimately improved and stabilized at approximately 1.7. Martin Cordova uses his BiPAP daily and does note good sleep.  In the past he had issues with mild peripheral edema, which ultimately improved.  He had been previously taken off his lisinopril when his creatinine had risen in the setting of his obstructive uropathy.  In 2015 he developed recurrent episodes of palpitations and was found to have recurrent paroxysmal atrial fibrillation/flutter.  His medications were adjusted and  he was started on antiarrhythmic therapy with Rythmol to take in addition to his increasing doses of carvedilol.  He was started on Eliquis for anticoagulation.  He underwent successful cardioversion on 08/05/2013.  Since that time, he is unaware of any recurrent atrial fibrillation.  He has noticed more energy. He had been maintained on 50 mg twice a day of carvedilol in addition to Rythmol 225 mg every 8 hours, but due to slow pulse rates, these doses have ultimately been reduced.  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 He has been on CPAP therapy initially for approximately 8 years and has been on BiPAP auto SV for over 4 years. He admits to using his BiPAP with 100% compliance.  He obtained a new BiPAP machine in June.  He feels that his machine is  significantly improved from his prior one. When I last saw him I reviewed his  download of his new BiPAP auto SV machine indicates that 90% of the time is EPAP pressure was 17 and 90% of the time his pressure support was 5.8, giving an IPAP of 21.8 cm.  He is using it 100% of the time and is averaging 5 hours and 49 minutes on his most recent download from 12/07/2013 through 01/05/2014.  His average AHI was 7.5.  His device setting maximum BiPAP pressure is 25 cm in maximum EPAP pressure 21 cm.  His average central event index is 1.3, and average obstructive apneic index 0.2, with an average copy index of 6.0.  Average breath.  Rate is 18 breaths per minute with a minute ventilation of 12.1.  Average total body may 670 mL.  When I saw him on 10/21/15 he was unaware of any arrhythmia.  He was on carvedilol 12.5 mg twice a day, Rythmol 225 mg twice a day, hydralazine 25 mg twice a day and eliquis  5 mg twice a day.  He also has been taking omeprazole for GERD and atorvastatin 10 mg for hyperlipidemia. He was found to have recurrent atrial flutter with variable block.  At that time, I recommended that he increase his Rythmol and take 225 mg every 8 hours and further increased his carvedilol to 18.75 mg twice a day.  Laboratory had revealed a magnesium of 2.0.  He has stage III chronic kidney disease  and his BUN was 26, creatinine 1.71 with a GFR estimate of 39.  Potassium was 4.8.  TSH was normal at 2.4.  Insomnia.  One week later in follow-up he had reverted back to sinus rhythm..  When I saw him in September 2017, he was maintaining sinus rhythm.  Since then, he admits to weight gain.  In early April, he began to notice some increasing shortness of breath and elevated heart rate and fatigue.  He was seen by [name] on 09/07/2016 and was found to be back in atrial flutter with a ventricular rate at 90.  He has been on eloquence 5 g twice a day for anticoagulation and was on carvedilol 18.75 twice a day.   Carvedilol dose was increased to 25 mg twice a day.  He was also told at that time to try increasing his Propofenone to 300 mg 3 times a day.  He took this for several days but did not tolerate the increased dose and reduced it back to its previous dose of 225 mg every 8 hours.  When I saw him on 10/20/2016  I increased carvedilol to 37.5 mg twice a day.  He has continued to use his BiPAP therapy for sleep apnea.    When I saw him in June 2018 he was still in atrial flutter with 3:1 block.  I discontinued hydralazine and started him on Cardizem CD 180 mg, both for blood pressure and potential antiarrhythmic therapy. He also was having ankle edema and I started him on low-dose hydrochlorothiazide.  His peripheral edema significantly improved.  He has felt better.  He denied episodes of chest tightness.  He underwent successful outpatient DC cardioversion by me on 12/26/2016.  He successfully cardioverted at 120 joules.  When he returned for a follow-up evaluation on 01/12/2017 and saw  [name], [facility] .  He was back in atrial flutter.  At that time his propafenone was discontinued.  Over the past several months, Martin Cordova has felt well.  At times there is some trace swelling in his ankles.  He continues to use his BiPAP with 100% compliance.  He was hospitalized on September 19 with an episode of diverticulitis and was discharged on 02/24/2017.   When I saw him in October 2018, he was back in atrial flutter.  At that time, I recommended consideration for atrial flutter ablation.  He was evaluated by Dr. Caryl Comes and on 04/19/2017 underwent successful atrial flutter ablation across the caval tricuspid isthmus, which interrupted bidirectional conduction.  Initial plan was for him to continue anticoagulation for 4 weeks, but unfortunately he developed irregular tachycardia palpitations about 48 hours after the procedure.  He's been found to have paroxysmal atrial fibrillation.  He as result is on chronic  anticoagulation therapy.  In November 2018 an echo Doppler study showed an EF of 40-45%.  He has been on carvedilol 25 mg twice a day and takes an extra carvedilol as needed.  He has had recurrent short burst of atrial fibrillation, but had an episode for almost an entire day.  He received a new BiPAP machine after his previous machine completely malfunction.  He admits to 100% compliance.  [employer] is his DME company.  He has diabetes mellitus and was started back on Tradjenta for hemoglobin A1c of 7.    He has a [employer] BiPAP auto SV unit. I obtained a download from September 02, 2017 through October 01, 2017.  He is compliant.  His average  CPAP pressure was 16.8 and average pressure support pressure was 4.1.  90% of the time device EPAP was 17 cm.  AHI, however, was still mildly increased at 8.9.  He is unaware of breakthrough snoring.  He denies restless legs.  He denies chest pain.  He has noticed some mild episodes of dizziness in the morning.    When I last saw him, I recommended some changes to his BiPAP therapy and change his ramp time to 5 minutes, increased but pressure support to 5.5 increased his EPAP to 9 cm water pressure.  New download was obtained from June 12 through December 14, 2017.  He is 100% compliant and averaging his hours and 24 minutes of CPAP use per night.  His average AHI is still slightly elevated 8.2.  90% of time device CPAP was 17 cm water pressure.  Though his pressure support range can from 0 to 5.5 cm, he apparently has only been needing a pressure support of 2.4 cm of water.  He feels well.  He has a F&P medium cushion full facemask but he is an oral breather and when he opens his mouth the facemask is actually too small which may be contributing to some of his increased AHI.  He presents to sleep clinic for further evaluation. He had been evaluated by Dr. Jimmy Footman for his renal issues.  He denies any awareness of recurrent atrial flutter.  He has seen  Dr. Dennard Schaumann here he also has been diagnosed with monoclonal gammopathy of undetermined significance and is monitored by Dr. Irene Limbo.  As I last saw him in July 2019, he has felt well.  He is unaware of any recurrent atrial flutter.  He had undergone a cardiac MRI which was ordered by Dr. Caryl Comes last year in February 2019 which showed normal LV size and function with an EF of 54%.  There was no delayed gadolinium uptake on inversion recovery sequences.  He had a trileaflet aortic valve with mild AR, mild MR, moderate RV enlargement, moderate biatrial enlargement and there was moderate LVH with the mid and basal septum measuring 14 mm compared to the posterior wall at 9 mm.  Since I last saw him, he has felt well.  He denies any chest pain.  He continues to use auto SV with 100% compliance.  A download was obtained in the office today from January 26 through July 30, 2018.  He is 100% compliant.  He requires high pressures and his average EPAP pressure was 17 which also was his 90% pressure.  He is set up to a potential maximum IPAP pressure of 30.  His pressure support ranges from 0-5.5 with an average pressure support of 2.4 cm.  He is now using a large mask which is much more comfortable and is he is doing better.  On his most recent download, his AHI was still mildly increased at 7.1/h.  He has undergone follow-up hematologic evaluation with Dr. Velvet Bathe for his monoclonal gammopathy.  He presents for follow-up cardiology evaluation.  Past Medical History  Diagnosis Date  . GERD 12/07/2006  . HYPERGLYCEMIA 12/07/2006  . HYPERLIPIDEMIA 12/07/2006    mixed wth an atherogenic dyslipidemic pattern  . HYPERTENSION 12/07/2006  . OBESITY 11/10/2009  . THROMBOCYTOPENIA 12/07/2006  . TRANSAMINASES, SERUM, ELEVATED 12/07/2006  . SLEEP APNEA 10/22/2007    uses bipap  . 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 Claiborne Billings  . Diabetes mellitus without complication   . Renal insufficiency  Cr to 3 on ACE  .  Nephrolithiasis   . Normal coronary arteries 04/06/2007    by cath EF 55%., last ECHO 10/13/10 EF>55% mild MR, last nuc 06/16/10 EF 57% low risk scan  . Pulmonary HTN     RV syst.  40-65mHg- moderate by echo  . Palpitations     tachy  . PAF (paroxysmal atrial fibrillation) 04/09/2013    Past Surgical History  Procedure Laterality Date  . Cholecystectomy    . Rotator cuff repair    . Cystoscopy/retrograde/ureteroscopy  08/19/2011    Procedure: CYSTOSCOPY/RETROGRADE/URETEROSCOPY;  Surgeon: MHanley Ben MD;  [facility]  Service: Urology;  Laterality: Right;  JJ STENT PLACEMENT   . Cardiac catheterization  04/06/2007    normal cors    Allergies  Allergen Reactions  . Lisinopril     Renal insufficiency    Current Outpatient Medications:  .  atorvastatin (LIPITOR) 10 MG tablet, TAKE 10 MG BY MOUTH AT BEDTIME, Disp: 90 tablet, Rfl: 3 .  carvedilol (COREG) 25 MG tablet, Take 1 tablet (25 mg total) by mouth 2 (two) times daily with a meal., Disp: 270 tablet, Rfl: 0 .  diltiazem (CARDIZEM CD) 240 MG 24 hr capsule, Take 1 capsule (240 mg total) by mouth at bedtime., Disp: 90 capsule, Rfl: 3 .  ELIQUIS 5 MG TABS tablet, TAKE 1 TABLET BY MOUTH TWICE A DAY, Disp: 60 tablet, Rfl: 0 .  hydrochlorothiazide (HYDRODIURIL) 25 MG tablet, Take 25 mg by mouth as directed., Disp: , Rfl:  .  linagliptin (TRADJENTA) 5 MG TABS tablet, Take 5 mg by mouth daily., Disp: , Rfl:  .  Multiple Vitamins-Minerals (CENTRUM SILVER 50+MEN) TABS, Take 1 tablet by mouth daily. , Disp: , Rfl:  .  omega-3 acid ethyl esters (LOVAZA) 1 g capsule, Take 2 capsules (2 g total) by mouth daily. KEEP OV., Disp: 180 capsule, Rfl: 0 .  Oxymetazoline HCl (NASAL SPRAY NA), Place 2 sprays at bedtime as needed into both nostrils (for congestion)., Disp: , Rfl:  .  potassium citrate (UROCIT-K) 10 MEQ (1080 MG) SR tablet, Take 10 mEq by mouth 2 (two) times daily. , Disp: , Rfl:  .  raNITIdine HCl (ZANTAC PO),  Take by mouth., Disp: , Rfl:  .  sildenafil (VIAGRA) 100 MG tablet, Take 0.5-1 tablets (50-100 mg total) by mouth daily as needed for erectile dysfunction., Disp: 5 tablet, Rfl: 11  Current Facility-Administered Medications:  .  0.9 %  sodium chloride infusion, 500 mL, Intravenous, Once, [place], SCarlota Raspberry MD  Socially he is married has 4 children and 5 grandchildren. He is a distant relative to the "Cade brothers." He does try to walk and exercise. There is no tobacco use. He does drink occasional alcohol.   ROS General: Negative; No fevers, chills, or night sweats;  HEENT: Negative; No changes in vision or hearing, sinus congestion, difficulty swallowing Pulmonary: Negative; No cough, wheezing, shortness of breath, hemoptysis Cardiovascular: See history of present illness No recent peripheral edema GI: Status post recent episode of diverticulitis requiring 2 day hospitalization GU:  No dysuria, hematuria, or difficulty voiding; some difficulty with erectile function Musculoskeletal: Negative; no myalgias, joint pain, or weakness Hematologic/Oncology: Positive M spike with monoclonal gammopathy of undetermined significance Endocrine: Negative; no heat/cold intolerance; no diabetes Neuro: Negative; no changes in balance, headaches Skin: Negative; No rashes or skin lesions Psychiatric: Negative; No behavioral problems, depression Sleep: He is using his BiPAP Auto SV therapy with 100% compliance.  No snoring,  daytime sleepiness, hypersomnolence, bruxism, restless legs, hypnogognic hallucinations, no cataplexy Other comprehensive 14 point system review is negative.  PE BP (!) 158/76   Pulse (!) 56   Ht _0  (1.803 m)   Wt 243 lb 9.6 oz (110.5 kg)   BMI 33.98 kg/m   Repeat blood pressure by me was 142/82  Wt Readings from Last 3 Encounters:  07/31/18 243 lb 9.6 oz (110.5 kg)  03/23/18 240 lb 9.6 oz (109.1 kg)  12/18/17 235 lb 6.4 oz (106.8 kg)    General: Alert, oriented,  no distress.  Skin: normal turgor, no rashes, warm and dry HEENT: Normocephalic, atraumatic. Pupils equal round and reactive to light; sclera anicteric; extraocular muscles intact;  Nose without nasal septal hypertrophy Mouth/Parynx benign; Mallinpatti scale 3 Neck: Thick neck; no JVD, no carotid bruits; normal carotid upstroke Lungs: clear to ausculatation and percussion; no wheezing or rales Chest wall: without tenderness to palpitation Heart: PMI not displaced, RRR, s1 s2 normal, 1/6 systolic murmur, no diastolic murmur, no rubs, gallops, thrills, or heaves Abdomen: soft, nontender; no hepatosplenomehaly, BS+; abdominal aorta nontender and not dilated by palpation. Back: no CVA tenderness Pulses 2+ Musculoskeletal: full range of motion, normal strength, no joint deformities Extremities: no clubbing cyanosis or edema, Homan's sign negative  Neurologic: grossly nonfocal; Cranial nerves grossly wnl Psychologic: Normal mood and affect   ECG (independently read by me): Sinus bradycardia at 56 bpm.  Right bundle branch block with repolarization changes.  October 03, 2017 ECG (independently read by me):  Sinus bradycardia at 50 bpm.  Right bundle branch block with repolarization changes. QTc interval 461 ms.  January 2019 ECG (independently read by me): Sinus bradycardia 56 bpm.  Possible left atrial enlargement.  Right bundle branch block with repolarization changes.  PR interval 186 ms; QTc interval 436 ms  October 2018 ECG (independently read by me): Atrial flutter with variable block at 96 bpm.  Right bundle-branch block with repolarization changes.  QTc interval 437 ms  July 2018 ECG (independently read by me): Probable atrial flutter with a ventricular rate at 82 bpm.  Right bundle-branch block, left anterior hemiblock.  11/08/2016 ECG (independently read by me): Probable atrial flutter 3:1 block , ventricular rate at 85;  right bundle branch block with repolarization changes.  QTc  interval 528 ms.   May 2018 ECG (independently read by me): atrial flutter with variable block with ventricular rate at 89 bpm.  Right bundle-branch block, left anterior hemiblock.  September 2017 ECG (independently read by me): Normal sinus rhythm at 63 bpm.  First-degree AV block with a PR interval of 208 ms.  Right bundle branch block with repolarization changes.  QTc interval 466 ms.  10/28/15 ECG (independently read by me): Sinus bradycardia at 54 bpm.  First degree block with PR interval 222 ms.  Right bundle-branch block.  10/21/2015 ECG (independently read by me): Atrial flutter with variable block.  Right bundle-branch block with repolarization changes.  October 2016 ECG (independently read by me): sinus rhythm with first-degree AV block with a PR interval at 216 ms.  Ventricular rate 86 bpm with occasional PVCs.    May 2016 ECG (independently read by me): Normal sinus rhythm at 60 bpm.  Right bundle-branch block with repolarization changes.  November 2015 ECG (independently read by me): Normal sinus rhythm at 61.  Nonspecific T abnormality.  QTc interval 394 ms.  11/18/2013 ECG (independently read by me): Sinus bradycardia 58 beats per minute.  PR interval 198 ms; QTc interval  371 ms.  Nonspecific ST changes.  07/29/2013 ECG  (independently read by me): Atrial flutter with 2:1 block with a ventricular rate of 87 beats per minute. Atrial rate is approximately 360 ms. 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 fib flutter now with right bundle branch block and repolarization changes.  Prior ECG of 06/04/2013: EKG  suggests probable atrial flutter with a ventricular rate of 105 beats per minute. There also are frequent PVCs. QTc interval is 436 ms. They're nonspecific T changes.  LABS: BMP Latest Ref Rng & Units 03/23/2018 10/17/2017 07/21/2017  Glucose 70 - 99 mg/dL 171(H) 92 -  BUN 8 - 23 mg/dL 19 35(H) -  Creatinine  0.61 - 1.24 mg/dL 1.77(H) 2.20(H) 1.78(H)  BUN/Creat Ratio 10 - 24 - - -  Sodium 135 - 145 mmol/L 141 141 -  Potassium 3.5 - 5.1 mmol/L 4.1 4.5 -  Chloride 98 - 111 mmol/L 107 105 -  CO2 22 - 32 mmol/L 25 30(H) -  Calcium 8.9 - 10.3 mg/dL 9.6 9.7 -   Hepatic Function Latest Ref Rng & Units 03/23/2018 10/17/2017 02/22/2017  Total Protein 6.5 - 8.1 g/dL 7.2 7.3 6.6  Albumin 3.5 - 5.0 g/dL 3.9 4.1 3.4(L)  AST 15 - 41 U/L 18 22 35  ALT 0 - 44 U/L 22 32 42  Alk Phosphatase 38 - 126 U/L 43 45 44  Total Bilirubin 0.3 - 1.2 mg/dL 0.9 0.9 2.2(H)  Bilirubin, Direct 0.0 - 0.3 mg/dL - - -   CBC Latest Ref Rng & Units 03/23/2018 10/17/2017 04/10/2017  WBC 4.0 - 10.5 K/uL 4.7 5.4 5.2  Hemoglobin 13.0 - 17.0 g/dL 13.5 14.5 13.5  Hematocrit 39.0 - 52.0 % 40.9 43.3 41.1  Platelets 150 - 400 K/uL 102(L) 102(L) 145(L)   Lab Results  Component Value Date   TSH 2.40 10/26/2015   Lipid Panel     Component Value Date/Time   CHOL 99 10/27/2017 0900   TRIG 161 (H) 10/27/2017 0900   HDL 31 (L) 10/27/2017 0900   CHOLHDL 3.2 10/27/2017 0900   VLDL 37 (H) 10/25/2016 0853   LDLCALC 44 10/27/2017 0900   LDLDIRECT 65.0 04/12/2013 0825     RADIOLOGY: No results found.  IMPRESSION:  1. Essential hypertension   2. PAF (paroxysmal atrial fibrillation) ([place])   3. OSA treated with BiPAP Auto SV   4. Anticoagulation adequate   5. Left ventricular hypertrophy   6. Type 2 diabetes mellitus with chronic kidney disease, without long-term current use of insulin, unspecified CKD stage [facility])     ASSESSMENT AND PLAN: Martin Cordova is a 78 year old Caucasian male who has documented normal coronary arteries by cardiac catheterization in 2008. He has a history of hypertension, mixed hyperlipidemia, and has paroxysmal atrial fibrillation/flutter. He has a history of diabetes mellitus and after he had lost a significant amount of weight and with renal insufficiency he was taken off his diabetic medication.  However,  due to recurrent weight gain he is now back on therapy with Tradjenta.  Underwent successful catheter ablation for recurrent atrial flutter in November 2018.  He continues to be on long-term anticoagulation with Eliquis.  He denies recurrent arrhythmia.  Dr Caryl Comes had ordered an MRI of his heart which was done in February 2019.  He had asked that I review these findings with him since he had not formally heard of the results.  I reviewed this in detail.  The present  study did not reveal any delayed gadolinium uptake arguing again scar.  Is only, he is maintaining sinus rhythm with sinus bradycardia and has chronic right bundle branch block.  I reviewed his BiPAP auto SV download.  I am making a slight adjustment and will increase his EPAP minimum pressure to 18.  He is compliant.  He is sleeping well and is unaware of any breakthrough snoring.  He denies daytime sleepiness.  Large mask is a better fit and he is tolerating this well.  His DME company is [employer].  Blood pressure today reviewed laboratory from several months ago.  Hemoglobin hematocrit were stable.  His M protein spike was 0.9.  His blood pressure today was initially elevated when taken by the nurse but on recheck by me was 130/74.  Continues to be on carvedilol 25 mg twice a day, diltiazem 240 mg daily and rarely takes HCTZ.  He continues to be on atorvastatin and Lovaza for his mixed hyperlipidemia.  He continues to be on Tradjenta for his diabetes mellitus.  I will see him in 6 months for reevaluation or sooner as needed.  Time spent: 25 minutes Shelva Majestic, MD  08/02/2018  5:39 PM

## 2018-09-13 ENCOUNTER — Other Ambulatory Visit: Payer: Self-pay | Admitting: Cardiovascular Disease

## 2018-09-14 ENCOUNTER — Other Ambulatory Visit: Payer: Medicare Other

## 2018-09-21 ENCOUNTER — Ambulatory Visit: Payer: Medicare Other | Admitting: Hematology

## 2018-09-30 ENCOUNTER — Other Ambulatory Visit: Payer: Self-pay | Admitting: Cardiovascular Disease

## 2018-10-01 NOTE — Telephone Encounter (Signed)
Carvedilol 25 mg refilled.

## 2018-11-01 ENCOUNTER — Encounter: Payer: Medicare Other | Admitting: Family Medicine

## 2018-11-03 ENCOUNTER — Other Ambulatory Visit: Payer: Self-pay | Admitting: Cardiovascular Disease

## 2018-11-09 ENCOUNTER — Other Ambulatory Visit: Payer: Self-pay

## 2018-11-09 ENCOUNTER — Inpatient Hospital Stay: Payer: Medicare Other | Attending: Hematology

## 2018-11-09 DIAGNOSIS — E1122 Type 2 diabetes mellitus with diabetic chronic kidney disease: Secondary | ICD-10-CM | POA: Insufficient documentation

## 2018-11-09 DIAGNOSIS — D472 Monoclonal gammopathy: Secondary | ICD-10-CM | POA: Diagnosis present

## 2018-11-09 DIAGNOSIS — Z7901 Long term (current) use of anticoagulants: Secondary | ICD-10-CM | POA: Diagnosis not present

## 2018-11-09 DIAGNOSIS — I4891 Unspecified atrial fibrillation: Secondary | ICD-10-CM | POA: Insufficient documentation

## 2018-11-09 DIAGNOSIS — N189 Chronic kidney disease, unspecified: Secondary | ICD-10-CM | POA: Insufficient documentation

## 2018-11-09 DIAGNOSIS — D696 Thrombocytopenia, unspecified: Secondary | ICD-10-CM | POA: Diagnosis not present

## 2018-11-09 DIAGNOSIS — K7581 Nonalcoholic steatohepatitis (NASH): Secondary | ICD-10-CM | POA: Diagnosis not present

## 2018-11-09 DIAGNOSIS — I129 Hypertensive chronic kidney disease with stage 1 through stage 4 chronic kidney disease, or unspecified chronic kidney disease: Secondary | ICD-10-CM | POA: Diagnosis not present

## 2018-11-09 DIAGNOSIS — Z87891 Personal history of nicotine dependence: Secondary | ICD-10-CM | POA: Insufficient documentation

## 2018-11-09 LAB — CBC WITH DIFFERENTIAL/PLATELET
Abs Immature Granulocytes: 0.01 10*3/uL (ref 0.00–0.07)
Basophils Absolute: 0 10*3/uL (ref 0.0–0.1)
Basophils Relative: 1 %
Eosinophils Absolute: 0.1 10*3/uL (ref 0.0–0.5)
Eosinophils Relative: 3 %
HCT: 39.7 % (ref 39.0–52.0)
Hemoglobin: 12.6 g/dL — ABNORMAL LOW (ref 13.0–17.0)
Immature Granulocytes: 0 %
Lymphocytes Relative: 17 %
Lymphs Abs: 0.7 10*3/uL (ref 0.7–4.0)
MCH: 30 pg (ref 26.0–34.0)
MCHC: 31.7 g/dL (ref 30.0–36.0)
MCV: 94.5 fL (ref 80.0–100.0)
Monocytes Absolute: 0.4 10*3/uL (ref 0.1–1.0)
Monocytes Relative: 9 %
Neutro Abs: 3 10*3/uL (ref 1.7–7.7)
Neutrophils Relative %: 70 %
Platelets: 86 10*3/uL — ABNORMAL LOW (ref 150–400)
RBC: 4.2 MIL/uL — ABNORMAL LOW (ref 4.22–5.81)
RDW: 13.1 % (ref 11.5–15.5)
WBC: 4.3 10*3/uL (ref 4.0–10.5)
nRBC: 0 % (ref 0.0–0.2)

## 2018-11-09 LAB — CMP (CANCER CENTER ONLY)
ALT: 20 U/L (ref 0–44)
AST: 16 U/L (ref 15–41)
Albumin: 4.1 g/dL (ref 3.5–5.0)
Alkaline Phosphatase: 37 U/L — ABNORMAL LOW (ref 38–126)
Anion gap: 8 (ref 5–15)
BUN: 26 mg/dL — ABNORMAL HIGH (ref 8–23)
CO2: 26 mmol/L (ref 22–32)
Calcium: 9 mg/dL (ref 8.9–10.3)
Chloride: 107 mmol/L (ref 98–111)
Creatinine: 1.95 mg/dL — ABNORMAL HIGH (ref 0.61–1.24)
GFR, Est AFR Am: 38 mL/min — ABNORMAL LOW (ref >60)
GFR, Estimated: 32 mL/min — ABNORMAL LOW (ref >60)
Glucose, Bld: 121 mg/dL — ABNORMAL HIGH (ref 70–99)
Potassium: 4.2 mmol/L (ref 3.5–5.1)
Sodium: 141 mmol/L (ref 135–145)
Total Bilirubin: 1 mg/dL (ref 0.3–1.2)
Total Protein: 7 g/dL (ref 6.5–8.1)

## 2018-11-13 LAB — MULTIPLE MYELOMA PANEL, SERUM
Albumin SerPl Elph-Mcnc: 4.1 g/dL (ref 2.9–4.4)
Albumin/Glob SerPl: 1.7 (ref 0.7–1.7)
Alpha 1: 0.2 g/dL (ref 0.0–0.4)
Alpha2 Glob SerPl Elph-Mcnc: 0.7 g/dL (ref 0.4–1.0)
B-Globulin SerPl Elph-Mcnc: 0.7 g/dL (ref 0.7–1.3)
Gamma Glob SerPl Elph-Mcnc: 0.9 g/dL (ref 0.4–1.8)
Globulin, Total: 2.5 g/dL (ref 2.2–3.9)
IgA: 96 mg/dL (ref 61–437)
IgG (Immunoglobin G), Serum: 1133 mg/dL (ref 603–1613)
IgM (Immunoglobulin M), Srm: 36 mg/dL (ref 15–143)
M Protein SerPl Elph-Mcnc: 0.7 g/dL — ABNORMAL HIGH
Total Protein ELP: 6.6 g/dL (ref 6.0–8.5)

## 2018-11-22 NOTE — Progress Notes (Signed)
Martin Cordova    HEMATOLOGY/ONCOLOGY CONSULTATION NOTE  Date of Service: 11/23/2018   Patient Care Team: Susy Frizzle, MD as PCP - General (Family Medicine) Troy Sine, MD as PCP - Cardiology (Cardiology)  Dr. Jeneen Rinks Deterding in Nephrology  CHIEF COMPLAINTS/PURPOSE OF CONSULTATION:  Follow up for Thrombocytopenia and MGUS   HISTORY OF PRESENTING ILLNESS:   Martin Cordova is a wonderful 78 y.o. male who has been referred to [place] by Dr Claiborne Billings for evaluation and management of thrombocytopenia.  Patient has a h/o HTN, HLD, DM2, sleep apnea, GERD, CAD, obesity and NASH who was referred for evaluation of thrombocytopenia.  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  Hgb and WBC count have been WNL. No issues with bleeding or bruising, No recent new medications. He is on chronic PPI therapy. He is chronic anticoagulation for his atrial fibrillation.  No other acute new concerns.  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 The patient's last visit with [place] was on 03/23/18. The pt reports that he is doing well overall.  The pt reports that he has not developed any new concerns in the interim. He notes that he has continued on 26m Eliquis BID for his Afib without any concerns for bleeding at this time. He notes that he has been overall less active during the pandemic and has gained a few pounds in the last 4 months. He has been eating well and is enjoying good energy levels overall. The pt has continued following up with his nephrologist Dr. JJeneen RinksDeterding and notes that he is trying to limit his salt intake actively.   The pt denies pain along the spine or back pain or new bone pains.  Lab results (11/09/18) of CBC w/diff and CMP is as follows: all values are WNL except for RBC at 4.20, HGB at 12.6, PLT at 86k, Glucose at 121, BUN at 26, Creatinine at 1.95, Alk Phos at 37, GFR at  32. 11/09/18 MMP revealed all values WNL except for M Protein at 0.7g  On review of systems, pt reports good energy levels, eating well, mild weight gain, and denies concerns for bleeding, new bone pains, nose bleeds, gum bleeds, pain along the spine, back pain, abdominal pains, and any other symptoms.   MEDICAL HISTORY:  Past Medical History:  Diagnosis Date  . Colon polyps   . Diabetes mellitus without complication ([place]   . Diverticulitis   . Diverticulosis   . GERD 12/07/2006  . HYPERGLYCEMIA 12/07/2006  . HYPERLIPIDEMIA 12/07/2006   mixed wth an atherogenic dyslipidemic pattern  . HYPERTENSION 12/07/2006  . Nephrolithiasis   . Nonalcoholic fatty liver disease   . Normal coronary arteries 04/06/2007   by cath EF 55%., last ECHO 10/13/10 EF>55% mild MR, last nuc 06/16/10 EF 57% low risk scan  . OBESITY 11/10/2009  . OSA treated with BiPAP 10/22/2007  . PAF (paroxysmal atrial fibrillation) ([facility] 04/09/2013  . Palpitations    tachy  . Pulmonary HTN (HCC)    RV syst.  40-519mg- moderate by echo  . Renal insufficiency    Cr to 3 on ACE  . Sleep apnea   . SOB (shortness of breath)    with exertion  . THROMBOCYTOPENIA 12/07/2006  . TRANSAMINASES, SERUM, ELEVATED 12/07/2006  . 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 keClaiborne Billings  SURGICAL HISTORY: Past Surgical History:  Procedure Laterality  Date  . A-FLUTTER ABLATION N/A 04/19/2017   Procedure: A-FLUTTER ABLATION;  Surgeon: Deboraha Sprang, MD;  Location: [place] [facility] [facility];  Service: Cardiovascular;  Laterality: N/A;  . CARDIAC CATHETERIZATION  04/06/2007   normal cors  . CARDIOVERSION N/A 08/05/2013   Procedure: CARDIOVERSION;  Surgeon: Troy Sine, MD;  Location: [facility];  Service: Cardiovascular;  Laterality: N/A;  . CARDIOVERSION N/A 12/26/2016   Procedure: CARDIOVERSION;  Surgeon: Troy Sine, MD;  [facility];  Service: Cardiovascular;  Laterality: N/A;  . CHOLECYSTECTOMY    .  CYSTOSCOPY/RETROGRADE/URETEROSCOPY  08/19/2011   Procedure: CYSTOSCOPY/RETROGRADE/URETEROSCOPY;  Surgeon: Hanley Ben, MD;  Location: [facility];  Service: Urology;  Laterality: Right;  JJ STENT PLACEMENT  . ROTATOR CUFF REPAIR Right     SOCIAL HISTORY: Social History   Socioeconomic History  . Marital status: Married    Spouse name: Not on file  . Number of children: 4  . Years of education: Not on file  . Highest education level: Not on file  Occupational History  . Occupation: retired Tour manager  . Financial resource strain: Not on file  . Food insecurity    Worry: Not on file    Inability: Not on file  . Transportation needs    Medical: Not on file    Non-medical: Not on file  Tobacco Use  . Smoking status: Former Smoker    Years: 10.00    Types: Cigarettes    Quit date: 08/04/1980    Years since quitting: 38.3  . Smokeless tobacco: Former Systems developer    Types: Freedom date: 08/08/2004  Substance and Sexual Activity  . Alcohol use: Yes    Alcohol/week: 0.0 standard drinks    Comment: 1 beer 2-3 times per month  . Drug use: No  . Sexual activity: Yes  Lifestyle  . Physical activity    Days per week: Not on file    Minutes per session: Not on file  . Stress: Not on file  Relationships  . Social Herbalist on phone: Not on file    Gets together: Not on file    Attends religious service: Not on file    Active member of club or organization: Not on file    Attends meetings of clubs or organizations: Not on file    Relationship status: Not on file  . Intimate partner violence    Fear of current or ex partner: Not on file    Emotionally abused: Not on file    Physically abused: Not on file    Forced sexual activity: Not on file  Other Topics Concern  . Not on file  Social History Narrative  . Not on file    FAMILY HISTORY: Family History  Problem Relation Age of Onset  . Diabetes Mother   . Heart failure Mother   .  Stroke Mother   . Kidney failure Father   . Diabetes Father   . Cancer Paternal Uncle   . Colon cancer Neg Hx   . Esophageal cancer Neg Hx   . Stomach cancer Neg Hx   . Rectal cancer Neg Hx     ALLERGIES:  is allergic to lisinopril and ibuprofen.  MEDICATIONS:  Current Outpatient Medications  Medication Sig Dispense Refill  . atorvastatin (LIPITOR) 10 MG tablet TAKE 10 MG BY MOUTH AT BEDTIME 90 tablet 3  . carvedilol (COREG) 25 MG tablet TAKE 1 AND 1/2 TABLET  BY MOUTH TWICE A DAY WITH A MEAL 270 tablet 0  . diltiazem (CARDIZEM CD) 240 MG 24 hr capsule Take 1 capsule (240 mg total) by mouth at bedtime. 90 capsule 3  . ELIQUIS 5 MG TABS tablet TAKE 1 TABLET BY MOUTH TWICE A DAY NEEDS APPT FOR REFILLS 180 tablet 1  . hydrochlorothiazide (HYDRODIURIL) 25 MG tablet Take 25 mg by mouth as directed.    . linagliptin (TRADJENTA) 5 MG TABS tablet Take 5 mg by mouth daily.    . Multiple Vitamins-Minerals (CENTRUM SILVER 50+MEN) TABS Take 1 tablet by mouth daily.     Martin Cordova omega-3 acid ethyl esters (LOVAZA) 1 g capsule TAKE 2 CAPSULES BY MOUTH EVERY DAY NEEDS APPT FOR REFILLS 180 capsule 1  . Oxymetazoline HCl (NASAL SPRAY NA) Place 2 sprays at bedtime as needed into both nostrils (for congestion).    . potassium citrate (UROCIT-K) 10 MEQ (1080 MG) SR tablet Take 10 mEq by mouth 2 (two) times daily.     . raNITIdine HCl (ZANTAC PO) Take by mouth.    . sildenafil (VIAGRA) 100 MG tablet Take 0.5-1 tablets (50-100 mg total) by mouth daily as needed for erectile dysfunction. 5 tablet 11   Current Facility-Administered Medications  Medication Dose Route Frequency Provider Last Rate Last Dose  . 0.9 %  sodium chloride infusion  500 mL Intravenous Once Armbruster, Carlota Raspberry, MD        REVIEW OF SYSTEMS:  A 10+ POINT REVIEW OF SYSTEMS WAS OBTAINED including neurology, dermatology, psychiatry, cardiac, respiratory, lymph, extremities, GI, GU, Musculoskeletal, constitutional, breasts, reproductive, HEENT.   All pertinent positives are noted in the HPI.  All others are negative.   PHYSICAL EXAMINATION: ECOG PERFORMANCE STATUS: 1 - Symptomatic but completely ambulatory  Filed Weights   11/23/18 1000  Weight: 248 lb 1.6 oz (112.5 kg)   .Body mass index is 34.6 kg/m.  GENERAL:alert, in no acute distress and comfortable SKIN: no acute rashes, no significant lesions EYES: conjunctiva are pink and non-injected, sclera anicteric OROPHARYNX: MMM, no exudates, no oropharyngeal erythema or ulceration NECK: supple, no JVD LYMPH:  no palpable lymphadenopathy in the cervical, axillary or inguinal regions LUNGS: clear to auscultation b/l with normal respiratory effort HEART: regular rate & rhythm ABDOMEN:  normoactive bowel sounds , non tender, not distended. Borderline palpable splenomegaly. Extremity: 1+ pedal edema PSYCH: alert & oriented x 3 with fluent speech NEURO: no focal motor/sensory deficits   LABORATORY DATA:  I have reviewed the data as listed  Component     Latest Ref Rng & Units 11/16/2015  WBC     4.0 - 10.3 10e3/uL 4.8  NEUT#     1.5 - 6.5 10e3/uL 3.2  Hemoglobin     13.0 - 17.1 g/dL 14.0  HCT     38.4 - 49.9 % 41.2  Platelets     140 - 400 10e3/uL 100 (L)  MCV     79.3 - 98.0 fL 91.6  MCH     27.2 - 33.4 pg 31.1  MCHC     32.0 - 36.0 g/dL 34.0  RBC     4.20 - 5.82 10e6/uL 4.50  RDW     11.0 - 14.6 % 13.2  lymph#     0.9 - 3.3 10e3/uL 1.0  MONO#     0.1 - 0.9 10e3/uL 0.5  Eosinophils Absolute     0.0 - 0.5 10e3/uL 0.1  Basophils Absolute     0.0 - 0.1 10e3/uL 0.0  NEUT%     39.0 - 75.0 % 66.0  LYMPH%     14.0 - 49.0 % 21.3  MONO%     0.0 - 14.0 % 10.0  EOS%     0.0 - 7.0 % 2.3  BASO%     0.0 - 2.0 % 0.4  Retic %     0.80 - 1.80 % 1.40  Retic Ct Abs     34.80 - 93.90 10e3/uL 63.00  Immature Retic Fract     3.00 - 10.60 % 5.20  Sodium     136 - 145 mEq/L 143  Potassium     3.5 - 5.1 mEq/L 4.8  Chloride     98 - 109 mEq/L 109  CO2     22 -  29 mEq/L 30 (H)  Glucose     70 - 140 mg/dl 85  BUN     7.0 - 26.0 mg/dL 22.6  Creatinine     0.7 - 1.3 mg/dL 1.7 (H)  Total Bilirubin     0.20 - 1.20 mg/dL 0.88  Alkaline Phosphatase     40 - 150 U/L 38 (L)  AST     5 - 34 U/L 17  ALT     0 - 55 U/L 24  Total Protein     6.4 - 8.3 g/dL 7.0  Albumin     3.5 - 5.0 g/dL 3.8  Calcium     8.4 - 10.4 mg/dL 9.5  Anion gap     3 - 11 mEq/L 4  EGFR     >90 ml/min/1.73 m2 40 (L)  HCV Ab     0.0 - 0.9 s/co ratio <0.1  Comment      Comment  HIV     Non Reactive Non Reactive  Vitamin B12     211 - 946 pg/mL 583  . CBC Latest Ref Rng & Units 11/09/2018 03/23/2018 10/17/2017  WBC 4.0 - 10.5 K/uL 4.3 4.7 5.4  Hemoglobin 13.0 - 17.0 g/dL 12.6(L) 13.5 14.5  Hematocrit 39.0 - 52.0 % 39.7 40.9 43.3  Platelets 150 - 400 K/uL 86(L) 102(L) 102(L)     . CBC    Component Value Date/Time   WBC 4.3 11/09/2018 0856   RBC 4.20 (L) 11/09/2018 0856   HGB 12.6 (L) 11/09/2018 0856   HGB 13.5 04/10/2017 0854   HGB 14.2 05/17/2016 0756   HCT 39.7 11/09/2018 0856   HCT 41.1 04/10/2017 0854   HCT 42.5 05/17/2016 0756   PLT 86 (L) 11/09/2018 0856   PLT 145 (L) 04/10/2017 0854   MCV 94.5 11/09/2018 0856   MCV 91 04/10/2017 0854   MCV 92.4 05/17/2016 0756   MCH 30.0 11/09/2018 0856   MCHC 31.7 11/09/2018 0856   RDW 13.1 11/09/2018 0856   RDW 14.8 04/10/2017 0854   RDW 12.8 05/17/2016 0756   LYMPHSABS 0.7 11/09/2018 0856   LYMPHSABS 0.8 04/10/2017 0854   LYMPHSABS 1.1 05/17/2016 0756   MONOABS 0.4 11/09/2018 0856   MONOABS 0.7 05/17/2016 0756   EOSABS 0.1 11/09/2018 0856   EOSABS 0.2 04/10/2017 0854   BASOSABS 0.0 11/09/2018 0856   BASOSABS 0.0 04/10/2017 0854   BASOSABS 0.0 05/17/2016 0756    . CMP Latest Ref Rng & Units 11/09/2018 03/23/2018 10/17/2017  Glucose 70 - 99 mg/dL 121(H) 171(H) 92  BUN 8 - 23 mg/dL 26(H) 19 35(H)  Creatinine 0.61 - 1.24 mg/dL 1.95(H) 1.77(H) 2.20(H)  Sodium 135 - 145 mmol/L 141 141 141  Potassium 3.5  - 5.1 mmol/L 4.2 4.1 4.5  Chloride 98 - 111 mmol/L 107 107 105  CO2 22 - 32 mmol/L 26 25 30(H)  Calcium 8.9 - 10.3 mg/dL 9.0 9.6 9.7  Total Protein 6.5 - 8.1 g/dL 7.0 7.2 7.3  Total Bilirubin 0.3 - 1.2 mg/dL 1.0 0.9 0.9  Alkaline Phos 38 - 126 U/L 37(L) 43 45  AST 15 - 41 U/L '16 18 22  ' ALT 0 - 44 U/L 20 22 32   OUTSIDE LABS from [facility] 08/07/17       RADIOGRAPHIC STUDIES: I have personally reviewed the radiological images as listed and agreed with the findings in the report.  US Renal 08/10/17  IMPRESSION: Multiple renal cysts of varying sizes are noted bilaterally consistent with polycystic kidney disease. Increased echogenicity of renal parenchyma is noted consistent with medical renal disease. No hydronephrosis or renal obstruction is noted   ABDOMEN ULTRASOUND COMPLETE  COMPARISON:  CT urogram of August 15, 2011 and a right upper quadrant ultrasound of September 23, 2008  FINDINGS: Gallbladder: The gallbladder is surgically absent.  Common bile duct: Diameter: 5 mm where visualized  Liver: Bowel gas obscures the left hepatic lobe. The echotexture of the liver is somewhat heterogeneously increased. There is no discrete mass or ductal dilation.  IVC: Largely obscured by bowel gas.  Pancreas: Obscured by bowel gas.  Spleen: Normal in size and echotexture  Right Kidney: Length: 11.3 cm. The renal cortical echotexture is increased. There are multiple cysts in the right kidney with the largest measuring 5.6 x 4.3 x 4.2 cm. No hydronephrosis is observed.  Left Kidney: Length: 21 cm. The renal cortical echotexture is increased. There are multiple cysts with the largest measuring 9.1 x 6.5 x 9.2 cm.  Abdominal aorta: No aneurysm visualized. Limited visualization due to bowel gas.  Other findings: None.  IMPRESSION: 1. Limited visualization of the liver. The left lobe is not well visualized. The hepatic echotexture is heterogeneously increased  without evidence of a discrete mass. The gallbladder is surgically absent. 2. Large kidneys with increased parenchymal echotexture and multiple large cysts. These findings have been demonstrated in the past. 3. Limited visualization of the CBD, IVC, abdominal aorta, and pancreas due to bowel gas and the patient's body habitus.   .No results found.    ASSESSMENT & PLAN:   78 y.o. caucasian male with multiple medical co-morbidities as noted above with   1) Chronic mild Isolated thrombocytopenia ( plt 90-110k).  Patient thrombocytopenia hasn't changed significantly since 2014. 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 Medication effect could be an additional element (propafenone, ppi ) Cannot r/o an immune component. No evidence of pseudothrombocytopenia on PBS. HIV and Hep C neg B12 wnl  2) MGUS - IgG Lambda  09/2017 24 urine showed M-Protein positive in his urine at 0.9g/mL  10/23/17 Bone Survey revealed No evidence of multiple myeloma. Multifocal degenerative changes as described.  Component     Latest Ref Rng & Units 10/17/2017  IgG (Immunoglobin G), Serum     700 - 1,600 mg/dL 1,171  IgA     61 - 437 mg/dL 104  IgM (Immunoglobulin M), Srm     15 - 143 mg/dL 37  Total Protein ELP     6.0 - 8.5 g/dL 7.1  Albumin SerPl Elph-Mcnc     2.9 - 4.4 g/dL 3.9  Alpha 1     0.0 - 0.4 g/dL 0.2  Alpha2 Glob SerPl Elph-Mcnc     0.4 - 1.0 g/dL 0.7  B-Globulin SerPl Elph-Mcnc     0.7 - 1.3 g/dL 1.0  Gamma Glob SerPl Elph-Mcnc     0.4 - 1.8 g/dL 1.2  M Protein SerPl Elph-Mcnc     Not Observed g/dL 0.9 (H)  Globulin, Total     2.2 - 3.9 g/dL 3.2  Albumin/Glob SerPl     0.7 - 1.7 1.3  IFE 1      Comment  Please Note (HCV):      Comment  Kappa free light chain     3.3 - 19.4 mg/L 32.7 (H)  Lamda free light chains     5.7 - 26.3  mg/L 95.0 (H)  Kappa, lamda light chain ratio     0.26 - 1.65 0.34  Sed Rate     0 - 16 mm/hr 2  Beta-2 Microglobulin     0.6 - 2.4 mg/L 3.0 (H)  LDH     125 - 245 U/L 390 (H)    3) CKD Stage III, nephrolithiasis, Polycystic Kidneys -Pt's father died from renal failure in his 53s.  -cysts were seen on 08/10/17 US renal  -He will continue to be seen by urologist who suggested he go on a low salt and low sugar diet.    PLAN  -Discussed pt labwork from 11/09/18; blood counts are stable overall including thrombocytopenia with PLT at 86k. Mild anemia with HGB at 12.6 felt to be a function of his CKD with creatinine at 1.95. 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 -Advised that pt stay well hydrated especially now in the summer months -Continue follow up with Cardiology regarding Eliquis and Afib -Ok to continue therapeutic anticoagulation at this platelet level. Would need to be careful if platelets drop to <=50k. -Discussed suspicion that patient's hyperactive spleen due to fatty liver could be cause for stable thrombocytopenia -Discussed that the only remaining evaluation is a BM Bx, which is an option but not strongly indicated in the absence of CRAB criteria. Pt prefers to continue watching labs, which is quite reasonable at this time. -Continue follow up with urology and nephrology  -Pt remains stable clinically and with labs -Will see the pt back in 6 months, and could alternate visits with PCP and begin seeing pt back every 12 months   RTC with Dr Irene Limbo with labs in 63month. Plz schedule labs 1 week prior to clinic visit   All of the patients questions were answered with apparent satisfaction. The patient knows to call the clinic with any problems, questions or concerns.  The total time spent in the appt was 15 minutes and more than 50% was on counseling and direct patient cares.    GSullivan LoneMD MSouth Padre IslandAAHIVMS SSur[facility] At [facility] Surgery CenterCMeadows Regional Medical CenterHematology/Oncology Physician CPeachtree Orth[facility] (Office):       [phone](Work cell):  [phone](Fax):           [phone] I, SBaldwin [place] am acting as a scribe for Dr. GSullivan Lone   .I have reviewed the above documentation for accuracy and completeness, and I agree with the above. .Brunetta GeneraMD

## 2018-11-23 ENCOUNTER — Other Ambulatory Visit: Payer: Self-pay

## 2018-11-23 ENCOUNTER — Inpatient Hospital Stay: Payer: Medicare Other | Admitting: Hematology

## 2018-11-23 ENCOUNTER — Telehealth: Payer: Self-pay | Admitting: Hematology

## 2018-11-23 VITALS — BP 139/70 | HR 55 | Temp 98.2°F | Resp 17 | Ht 71.0 in | Wt 248.1 lb

## 2018-11-23 DIAGNOSIS — E1122 Type 2 diabetes mellitus with diabetic chronic kidney disease: Secondary | ICD-10-CM

## 2018-11-23 DIAGNOSIS — D472 Monoclonal gammopathy: Secondary | ICD-10-CM | POA: Diagnosis not present

## 2018-11-23 DIAGNOSIS — Z87891 Personal history of nicotine dependence: Secondary | ICD-10-CM

## 2018-11-23 DIAGNOSIS — Z7901 Long term (current) use of anticoagulants: Secondary | ICD-10-CM

## 2018-11-23 DIAGNOSIS — N189 Chronic kidney disease, unspecified: Secondary | ICD-10-CM

## 2018-11-23 DIAGNOSIS — D696 Thrombocytopenia, unspecified: Secondary | ICD-10-CM | POA: Diagnosis not present

## 2018-11-23 DIAGNOSIS — I129 Hypertensive chronic kidney disease with stage 1 through stage 4 chronic kidney disease, or unspecified chronic kidney disease: Secondary | ICD-10-CM

## 2018-11-23 DIAGNOSIS — I4891 Unspecified atrial fibrillation: Secondary | ICD-10-CM

## 2018-11-23 DIAGNOSIS — K7581 Nonalcoholic steatohepatitis (NASH): Secondary | ICD-10-CM

## 2018-11-23 NOTE — Telephone Encounter (Signed)
Scheduled appt per 6/19 los.  Printed calendar and avs.

## 2018-12-04 ENCOUNTER — Encounter: Payer: Medicare Other | Admitting: Family Medicine

## 2018-12-09 ENCOUNTER — Other Ambulatory Visit: Payer: Self-pay | Admitting: Cardiovascular Disease

## 2018-12-18 ENCOUNTER — Ambulatory Visit (INDEPENDENT_AMBULATORY_CARE_PROVIDER_SITE_OTHER): Payer: Medicare Other | Admitting: Family Medicine

## 2018-12-18 ENCOUNTER — Other Ambulatory Visit: Payer: Self-pay

## 2018-12-18 ENCOUNTER — Encounter: Payer: Self-pay | Admitting: Family Medicine

## 2018-12-18 VITALS — BP 126/62 | HR 80 | Temp 97.8°F | Resp 14 | Ht 71.0 in | Wt 243.0 lb

## 2018-12-18 DIAGNOSIS — E785 Hyperlipidemia, unspecified: Secondary | ICD-10-CM | POA: Diagnosis not present

## 2018-12-18 DIAGNOSIS — Z0000 Encounter for general adult medical examination without abnormal findings: Secondary | ICD-10-CM

## 2018-12-18 DIAGNOSIS — D472 Monoclonal gammopathy: Secondary | ICD-10-CM

## 2018-12-18 DIAGNOSIS — Z0001 Encounter for general adult medical examination with abnormal findings: Secondary | ICD-10-CM | POA: Diagnosis not present

## 2018-12-18 DIAGNOSIS — I480 Paroxysmal atrial fibrillation: Secondary | ICD-10-CM

## 2018-12-18 DIAGNOSIS — E119 Type 2 diabetes mellitus without complications: Secondary | ICD-10-CM

## 2018-12-18 DIAGNOSIS — Z125 Encounter for screening for malignant neoplasm of prostate: Secondary | ICD-10-CM | POA: Diagnosis not present

## 2018-12-18 NOTE — Progress Notes (Signed)
Subjective:    Patient ID: Martin Cordova, male    DOB: Apr 26, 1941, 78 y.o.   MRN: [id number]  HPI  Patient is a very pleasant 78 year old Caucasian male who is here today for complete physical exam.  Patient's colonoscopy was performed last year.  There were 5 polyps.  He is due again likely in 4 years.  He is due for a PSA to screen for prostate cancer.  Pneumovax 23, Prevnar 13, and his shingles vaccines are all up-to-date.  He is due for a flu shot this fall.  He denies any problems with his balance.  He denies any falls.  He denies any depression.  He denies any memory loss or trouble with ADLs.  He does have chronic kidney disease.  He is still seeing a nephrologist.  His renal function is down to approximately 40% per his report.  He is due for lab work to monitor this.  He is not checking his blood sugar.  He denies any polyuria, polydipsia, or blurry vision.  He denies any neuropathy in his feet.  He denies any chest pain shortness of breath or dyspnea on exertion.  He is in atrial fibrillation today but he is appropriately anticoagulated on Eliquis. Past Medical History:  Diagnosis Date  . Colon polyps   . Diabetes mellitus without complication ([place])   . Diverticulitis   . Diverticulosis   . GERD 12/07/2006  . HYPERGLYCEMIA 12/07/2006  . HYPERLIPIDEMIA 12/07/2006   mixed wth an atherogenic dyslipidemic pattern  . HYPERTENSION 12/07/2006  . Nephrolithiasis   . Nonalcoholic fatty liver disease   . Normal coronary arteries 04/06/2007   by cath EF 55%., last ECHO 10/13/10 EF>55% mild MR, last nuc 06/16/10 EF 57% low risk scan  . OBESITY 11/10/2009  . OSA treated with BiPAP 10/22/2007  . PAF (paroxysmal atrial fibrillation) ([place]) 04/09/2013  . Palpitations    tachy  . Pulmonary HTN (HCC)    RV syst.  40-86mmHg- moderate by echo  . Renal insufficiency    Cr to 3 on ACE  . Sleep apnea   . SOB (shortness of breath)    with exertion  . THROMBOCYTOPENIA 12/07/2006  . TRANSAMINASES, SERUM,  ELEVATED 12/07/2006  . 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 Claiborne Billings   Past Surgical History:  Procedure Laterality Date  . A-FLUTTER ABLATION N/A 04/19/2017   Procedure: A-FLUTTER ABLATION;  Surgeon: Deboraha Sprang, MD;  Location: [facility];  Service: Cardiovascular;  Laterality: N/A;  . CARDIAC CATHETERIZATION  04/06/2007   normal cors  . CARDIOVERSION N/A 08/05/2013   Procedure: CARDIOVERSION;  Surgeon: Troy Sine, MD;  Location: [facility];  Service: Cardiovascular;  Laterality: N/A;  . CARDIOVERSION N/A 12/26/2016   Procedure: CARDIOVERSION;  Surgeon: Troy Sine, MD;  Location: [facility];  Service: Cardiovascular;  Laterality: N/A;  . CHOLECYSTECTOMY    . CYSTOSCOPY/RETROGRADE/URETEROSCOPY  08/19/2011   Procedure: CYSTOSCOPY/RETROGRADE/URETEROSCOPY;  Surgeon: Hanley Ben, MD;  Location: [facility];  Service: Urology;  Laterality: Right;  JJ STENT PLACEMENT  . ROTATOR CUFF REPAIR Right    Current Outpatient Medications on File Prior to Visit  Medication Sig Dispense Refill  . atorvastatin (LIPITOR) 10 MG tablet TAKE 10 MG BY MOUTH AT BEDTIME 90 tablet 3  . carvedilol (COREG) 25 MG tablet TAKE 1 AND 1/2 TABLET BY MOUTH TWICE A DAY WITH A MEAL 270 tablet 0  . diltiazem (CARDIZEM CD) 240 MG 24 hr capsule TAKE 1 CAPSULE (240  MG TOTAL) BY MOUTH AT BEDTIME. 90 capsule 3  . ELIQUIS 5 MG TABS tablet TAKE 1 TABLET BY MOUTH TWICE A DAY NEEDS APPT FOR REFILLS 180 tablet 1  . linagliptin (TRADJENTA) 5 MG TABS tablet Take 5 mg by mouth daily.    . Multiple Vitamins-Minerals (CENTRUM SILVER 50+MEN) TABS Take 1 tablet by mouth daily.     Marland Kitchen omega-3 acid ethyl esters (LOVAZA) 1 g capsule TAKE 2 CAPSULES BY MOUTH EVERY DAY NEEDS APPT FOR REFILLS 180 capsule 1  . potassium citrate (UROCIT-K) 10 MEQ (1080 MG) SR tablet Take 10 mEq by mouth 2 (two) times daily.     . sildenafil (VIAGRA) 100 MG tablet Take 0.5-1 tablets (50-100 mg total) by mouth daily  as needed for erectile dysfunction. 5 tablet 11   Current Facility-Administered Medications on File Prior to Visit  Medication Dose Route Frequency Provider Last Rate Last Dose  . 0.9 %  sodium chloride infusion  500 mL Intravenous Once Armbruster, Carlota Raspberry, MD       Allergies  Allergen Reactions  . Lisinopril Swelling and Other (See Comments)    Renal insufficiency also  . Ibuprofen Other (See Comments)    Per the patient, he has "kidney and liver issues" and is NOT suppose to take this   Social History   Socioeconomic History  . Marital status: Married    Spouse name: Not on file  . Number of children: 4  . Years of education: Not on file  . Highest education level: Not on file  Occupational History  . Occupation: retired Tour manager  . Financial resource strain: Not on file  . Food insecurity    Worry: Not on file    Inability: Not on file  . Transportation needs    Medical: Not on file    Non-medical: Not on file  Tobacco Use  . Smoking status: Former Smoker    Years: 10.00    Types: Cigarettes    Quit date: 08/04/1980    Years since quitting: 38.3  . Smokeless tobacco: Former Systems developer    Types: [place] date: 08/08/2004  Substance and Sexual Activity  . Alcohol use: Yes    Alcohol/week: 0.0 standard drinks    Comment: 1 beer 2-3 times per month  . Drug use: No  . Sexual activity: Yes  Lifestyle  . Physical activity    Days per week: Not on file    Minutes per session: Not on file  . Stress: Not on file  Relationships  . Social Herbalist on phone: Not on file    Gets together: Not on file    Attends religious service: Not on file    Active member of club or organization: Not on file    Attends meetings of clubs or organizations: Not on file    Relationship status: Not on file  . Intimate partner violence    Fear of current or ex partner: Not on file    Emotionally abused: Not on file    Physically abused: Not on file    Forced sexual  activity: Not on file  Other Topics Concern  . Not on file  Social History Narrative  . Not on file   Family History  Problem Relation Age of Onset  . Diabetes Mother   . Heart failure Mother   . Stroke Mother   . Kidney failure Father   . Diabetes Father   .  Cancer Paternal Uncle   . Colon cancer Neg Hx   . Esophageal cancer Neg Hx   . Stomach cancer Neg Hx   . Rectal cancer Neg Hx      Review of Systems  All other systems reviewed and are negative.      Objective:   Physical Exam  Constitutional: He is oriented to person, place, and time. He appears well-developed and well-nourished. No distress.  HENT:  Head: Normocephalic and atraumatic.  Right Ear: External ear normal.  Left Ear: External ear normal.  Nose: Nose normal.  Mouth/Throat: Oropharynx is clear and moist. No oropharyngeal exudate.  Eyes: Pupils are equal, round, and reactive to light. Conjunctivae and EOM are normal. Right eye exhibits no discharge. Left eye exhibits no discharge. No scleral icterus.  Neck: Normal range of motion. Neck supple. No JVD present. No tracheal deviation present. No thyromegaly present.  Cardiovascular: Normal rate, regular rhythm, normal heart sounds and intact distal pulses. Exam reveals no gallop and no friction rub.  No murmur heard. Pulmonary/Chest: Effort normal and breath sounds normal. No stridor. No respiratory distress. He has no wheezes. He has no rales. He exhibits no tenderness.  Abdominal: Soft. Bowel sounds are normal. He exhibits no distension and no mass. There is no abdominal tenderness. There is no rebound and no guarding.  Musculoskeletal: Normal range of motion.        General: No tenderness or edema.  Lymphadenopathy:    He has no cervical adenopathy.  Neurological: He is alert and oriented to person, place, and time. He has normal reflexes. No cranial nerve deficit. He exhibits normal muscle tone. Coordination normal.  Skin: Skin is warm. No rash noted. He  is not diaphoretic. No erythema. No pallor.  Psychiatric: He has a normal mood and affect. His behavior is normal. Judgment and thought content normal.  Vitals reviewed.         Assessment & Plan:  1. General medical exam   2. Controlled type 2 diabetes mellitus without complication, without long-term current use of insulin (HCC) - CBC with Differential/Platelet - COMPLETE METABOLIC PANEL WITH GFR - Lipid panel - Microalbumin, urine - Hemoglobin A1c  3. Hyperlipidemia LDL goal <70 4. Prostate cancer screening - PSA  5. PAF (paroxysmal atrial fibrillation) ([place]) 6. MGUS (monoclonal gammopathy of unknown significance) Immunization History  Administered Date(s) Administered  . Influenza Split 03/06/2012  . Influenza Whole 03/19/2009, 03/06/2010  . Influenza, High Dose Seasonal PF 02/08/2016, 04/10/2017, 02/15/2018  . Influenza,inj,Quad PF,6+ Mos 02/23/2013  . Influenza-Unspecified 04/06/2014, 03/28/2015, 04/10/2017  . Pneumococcal Conjugate-13 06/20/2014  . Pneumococcal Polysaccharide-23 06/16/2008  . Td 01/25/1999, 07/06/2009  . Tdap 06/20/2014  . Zoster 02/09/2013  . Zoster Recombinat (Shingrix) 12/09/2017, 02/15/2018   Immunizations are up-to-date.  Cancer screening is up-to-date.  Diabetic foot exam was performed.  Recommended diabetic eye exam.  I will check a fasting lipid panel.  Goal LDL cholesterol is less than 70.  I will check a hemoglobin A1c.  Goal hemoglobin A1c is less than 7.  I will screen the patient for prostate cancer with a PSA.  Blood pressure today is outstanding.

## 2018-12-19 ENCOUNTER — Other Ambulatory Visit: Payer: Medicare Other

## 2018-12-19 DIAGNOSIS — Z0000 Encounter for general adult medical examination without abnormal findings: Secondary | ICD-10-CM

## 2018-12-19 DIAGNOSIS — E119 Type 2 diabetes mellitus without complications: Secondary | ICD-10-CM

## 2018-12-19 DIAGNOSIS — Z125 Encounter for screening for malignant neoplasm of prostate: Secondary | ICD-10-CM

## 2018-12-19 DIAGNOSIS — E785 Hyperlipidemia, unspecified: Secondary | ICD-10-CM

## 2018-12-20 ENCOUNTER — Encounter: Payer: Self-pay | Admitting: Family Medicine

## 2018-12-20 LAB — CBC WITH DIFFERENTIAL/PLATELET
Absolute Monocytes: 557 cells/uL (ref 200–950)
Basophils Absolute: 21 cells/uL (ref 0–200)
Basophils Relative: 0.4 %
Eosinophils Absolute: 111 cells/uL (ref 15–500)
Eosinophils Relative: 2.1 %
HCT: 43.8 % (ref 38.5–50.0)
Hemoglobin: 14.5 g/dL (ref 13.2–17.1)
Lymphs Abs: 1129 cells/uL (ref 850–3900)
MCH: 30.5 pg (ref 27.0–33.0)
MCHC: 33.1 g/dL (ref 32.0–36.0)
MCV: 92 fL (ref 80.0–100.0)
MPV: 11.9 fL (ref 7.5–12.5)
Monocytes Relative: 10.5 %
Neutro Abs: 3482 cells/uL (ref 1500–7800)
Neutrophils Relative %: 65.7 %
Platelets: 116 10*3/uL — ABNORMAL LOW (ref 140–400)
RBC: 4.76 10*6/uL (ref 4.20–5.80)
RDW: 12.8 % (ref 11.0–15.0)
Total Lymphocyte: 21.3 %
WBC: 5.3 10*3/uL (ref 3.8–10.8)

## 2018-12-20 LAB — LIPID PANEL
Cholesterol: 103 mg/dL (ref <200)
HDL: 36 mg/dL — ABNORMAL LOW (ref >=40)
LDL Cholesterol (Calc): 42 mg/dL (calc)
Non-HDL Cholesterol (Calc): 67 mg/dL (calc) (ref <130)
Total CHOL/HDL Ratio: 2.9 (calc) (ref <5.0)
Triglycerides: 167 mg/dL — ABNORMAL HIGH (ref <150)

## 2018-12-20 LAB — COMPREHENSIVE METABOLIC PANEL
AG Ratio: 1.7 (calc) (ref 1.0–2.5)
ALT: 20 U/L (ref 9–46)
AST: 13 U/L (ref 10–35)
Albumin: 4.3 g/dL (ref 3.6–5.1)
Alkaline phosphatase (APISO): 37 U/L (ref 35–144)
BUN/Creatinine Ratio: 17 (calc) (ref 6–22)
BUN: 34 mg/dL — ABNORMAL HIGH (ref 7–25)
CO2: 28 mmol/L (ref 20–32)
Calcium: 10 mg/dL (ref 8.6–10.3)
Chloride: 107 mmol/L (ref 98–110)
Creat: 2.02 mg/dL — ABNORMAL HIGH (ref 0.70–1.18)
Globulin: 2.5 g/dL (calc) (ref 1.9–3.7)
Glucose, Bld: 121 mg/dL — ABNORMAL HIGH (ref 65–99)
Potassium: 5.4 mmol/L — ABNORMAL HIGH (ref 3.5–5.3)
Sodium: 141 mmol/L (ref 135–146)
Total Bilirubin: 0.8 mg/dL (ref 0.2–1.2)
Total Protein: 6.8 g/dL (ref 6.1–8.1)

## 2018-12-20 LAB — HEMOGLOBIN A1C
Hgb A1c MFr Bld: 6.1 % of total Hgb — ABNORMAL HIGH (ref <5.7)
Mean Plasma Glucose: 128 (calc)
eAG (mmol/L): 7.1 (calc)

## 2018-12-20 LAB — PSA: PSA: 1.3 ng/mL (ref <=4.0)

## 2018-12-26 ENCOUNTER — Other Ambulatory Visit: Payer: Self-pay | Admitting: Cardiovascular Disease

## 2018-12-29 ENCOUNTER — Other Ambulatory Visit: Payer: Self-pay | Admitting: Cardiovascular Disease

## 2019-01-08 ENCOUNTER — Telehealth: Payer: Self-pay | Admitting: Cardiovascular Disease

## 2019-01-08 NOTE — Telephone Encounter (Signed)
New message:    Patient calling stating that he has been trying to get appt with Dr. Claiborne Billings for a while and he dose not want to see a PA. Patient states that Dr. Claiborne Billings told him to call and get a 3 mo. Follow up but Dr. Claiborne Billings do not have anything until 10/29. He did take that one, but states he need a sooner appt.

## 2019-01-08 NOTE — Telephone Encounter (Signed)
Called patient, advised that previous note from Feb, stated to follow up in 6 months after his ECHO which is scheduled for the 21st of August.  Patient advised to keep October appointment, and we will call with results of ECHO, and if Dr.Kelly suggest to be seen sooner we will do that.  Patient verbalized understanding.

## 2019-01-25 ENCOUNTER — Ambulatory Visit (HOSPITAL_COMMUNITY): Payer: Medicare Other | Attending: Cardiovascular Disease

## 2019-01-25 ENCOUNTER — Other Ambulatory Visit: Payer: Self-pay

## 2019-01-25 DIAGNOSIS — I10 Essential (primary) hypertension: Secondary | ICD-10-CM | POA: Insufficient documentation

## 2019-01-25 DIAGNOSIS — I480 Paroxysmal atrial fibrillation: Secondary | ICD-10-CM | POA: Diagnosis present

## 2019-02-19 ENCOUNTER — Other Ambulatory Visit: Payer: Self-pay | Admitting: Dermatology

## 2019-04-05 ENCOUNTER — Ambulatory Visit: Payer: Medicare Other | Admitting: Cardiovascular Disease

## 2019-04-05 ENCOUNTER — Encounter: Payer: Self-pay | Admitting: Cardiovascular Disease

## 2019-04-05 ENCOUNTER — Other Ambulatory Visit: Payer: Self-pay

## 2019-04-05 VITALS — BP 142/72 | HR 55 | Temp 97.2°F | Ht 71.0 in | Wt 239.0 lb

## 2019-04-05 DIAGNOSIS — Z7901 Long term (current) use of anticoagulants: Secondary | ICD-10-CM

## 2019-04-05 DIAGNOSIS — G4733 Obstructive sleep apnea (adult) (pediatric): Secondary | ICD-10-CM | POA: Diagnosis not present

## 2019-04-05 DIAGNOSIS — E1122 Type 2 diabetes mellitus with diabetic chronic kidney disease: Secondary | ICD-10-CM

## 2019-04-05 DIAGNOSIS — I480 Paroxysmal atrial fibrillation: Secondary | ICD-10-CM | POA: Diagnosis not present

## 2019-04-05 DIAGNOSIS — I4510 Unspecified right bundle-branch block: Secondary | ICD-10-CM

## 2019-04-05 DIAGNOSIS — E785 Hyperlipidemia, unspecified: Secondary | ICD-10-CM

## 2019-04-05 NOTE — Patient Instructions (Addendum)
Follow-Up: IN 6 months Please call our office 2 months in advance, JAN 2021 to schedule this APR 2021 appointment. In Person You may see Shelva Majestic, MD or one of the following Advanced Practice Providers on your designated Care Team:   [name], PA-C  Fabian Sharp, PA-C or [place], [place].    Medication Instructions:  The current medical regimen is effective;  continue present plan and medications as directed. Please refer to the Current Medication list given to you today. If you need a refill on your cardiac medications before your next appointment, please call your pharmacy.  At [facility], you and your health needs are our priority.  As part of our continuing mission to provide you with exceptional heart care, we have created designated Provider Care Teams.  These Care Teams include your primary Cardiologist (physician) and Advanced Practice Providers (APPs -  Physician Assistants and Nurse Practitioners) who all work together to provide you with the care you need, when you need it.  Thank you for choosing [facility] at [facility]!!

## 2019-04-05 NOTE — Progress Notes (Signed)
Patient ID: Martin Cordova, male   DOB: 1941-04-15, 77 y.o.   MRN: [id number]      HPI: Martin Cordova is a 78 y.o. male is who presents to the office today for an 8 month follow-up cardiology/sleep clinic evaluation.  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 In the past, he developed an obstructive uropathy attributed to kidney stones; creatinine had risen up to 3 and ultimately improved and stabilized at approximately 1.7. Martin Cordova uses his BiPAP daily and does note good sleep.  In the past he had issues with mild peripheral edema, which ultimately improved.  He had been previously taken off his lisinopril when his creatinine had risen in the setting of his obstructive uropathy.  In 2015 he developed recurrent episodes of palpitations and was found to have recurrent paroxysmal atrial fibrillation/flutter.  His medications were adjusted and  he was started on antiarrhythmic therapy with Rythmol to take in addition to his increasing doses of carvedilol.  He was started on Eliquis for anticoagulation.  He underwent successful cardioversion on 08/05/2013.  Since that time, he is unaware of any recurrent atrial fibrillation.  He has noticed more energy. He had been maintained on 50 mg twice a day of carvedilol in addition to Rythmol 225 mg every 8 hours, but due to slow pulse rates, these doses have ultimately been reduced.  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 He has been on CPAP therapy initially for approximately 8 years and has been on BiPAP auto SV for over 4 years. He admits to using his BiPAP with 100% compliance.  He obtained a new BiPAP machine in June.  He feels that his machine is  significantly improved from his prior one. When I last saw him I reviewed his  download of his new BiPAP auto SV machine indicates that 90% of the time is EPAP pressure was 17 and 90% of the time his pressure support was 5.8, giving an IPAP of 21.8 cm.  He is using it 100% of the time and is averaging 5 hours and 49 minutes on his most recent download from 12/07/2013 through 01/05/2014.  His average AHI was 7.5.  His device setting maximum BiPAP pressure is 25 cm in maximum EPAP pressure 21 cm.  His average central event index is 1.3, and average obstructive apneic index 0.2, with an average copy index of 6.0.  Average breath.  Rate is 18 breaths per minute with a minute ventilation of 12.1.  Average total body may 670 mL.  When I saw him on 10/21/15 he was unaware of any arrhythmia.  He was on carvedilol 12.5 mg twice a day, Rythmol 225 mg twice a day, hydralazine 25 mg twice a day and eliquis  5 mg twice a day.  He also has been taking omeprazole for GERD and atorvastatin 10 mg for hyperlipidemia. He was found to have recurrent atrial flutter with variable block.  At that time, I recommended that he increase his Rythmol and take 225 mg every 8 hours and further increased his carvedilol to 18.75 mg twice a day.  Laboratory had revealed a magnesium of 2.0.  He has stage III chronic kidney disease  and his BUN was 26, creatinine 1.71 with a GFR estimate of 39.  Potassium was 4.8.  TSH was normal at 2.4.  Insomnia.  One week later in follow-up he had reverted back to sinus rhythm..  When seen in September 2017, he was maintaining sinus rhythm.  Since then, he admits to weight gain.  In early April, he began to notice some increasing shortness of breath and elevated heart rate and fatigue.  He was seen by Hao Meng on 09/07/2016 and was found to be back in atrial flutter with a ventricular rate at 90.  He has been on eloquence 5 g twice a day for anticoagulation and was on carvedilol 18.75 twice a day.  Carvedilol  dose was increased to 25 mg twice a day.  He was also told at that time to try increasing his Propofenone to 300 mg 3 times a day.  He took this for several days but did not tolerate the increased dose and reduced it back to its previous dose of 225 mg every 8 hours.  When I saw him on 10/20/2016  I increased carvedilol to 37.5 mg twice a day.  He has continued to use his BiPAP therapy for sleep apnea.    When I saw him in June 2018 he was still in atrial flutter with 3:1 block.  I discontinued hydralazine and started him on Cardizem CD 180 mg, both for blood pressure and potential antiarrhythmic therapy. He also was having ankle edema and I started him on low-dose hydrochlorothiazide.  His peripheral edema significantly improved.  He has felt better.  He denied episodes of chest tightness.  He underwent successful outpatient DC cardioversion by me on 12/26/2016.  He successfully cardioverted at 120 joules.  When he returned for a follow-up evaluation on 01/12/2017 and saw  Hao Meng, PAC .  He was back in atrial flutter.  At that time his propafenone was discontinued.  Over the past several months, Martin Cordova has felt well.  At times there is some trace swelling in his ankles.  He continues to use his BiPAP with 100% compliance.  He was hospitalized on September 19 with an episode of diverticulitis and was discharged on 02/24/2017.   When I saw him in October 2018, he was back in atrial flutter.  At that time, I recommended consideration for atrial flutter ablation.  He was evaluated by Dr. Klein and on 04/19/2017 underwent successful atrial flutter ablation across the caval tricuspid isthmus, which interrupted bidirectional conduction.  Initial plan was for him to continue anticoagulation for 4 weeks, but unfortunately he developed irregular tachycardia palpitations about 48 hours after the procedure.  He's been found to have paroxysmal atrial fibrillation.  He as result is on chronic anticoagulation  therapy.  In November 2018 an echo Doppler study showed an EF of 40-45%.  He has been on carvedilol 25 mg twice a day and takes an extra carvedilol as needed.  He has had recurrent short burst of atrial fibrillation, but had an episode for almost an entire day.  He received a new BiPAP machine after his previous machine completely malfunction.  He admits to 100% compliance.  [facility] is his DME company.  He has diabetes mellitus and was started back on Tradjenta for hemoglobin A1c of 7.    He has a [employer] BiPAP auto SV unit. I obtained a download from September 02, 2017 through October 01, 2017.  He is compliant.  His average CPAP pressure   was 16.8 and average pressure support pressure was 4.1.  90% of the time device EPAP was 17 cm.  AHI, however, was still mildly increased at 8.9.  He is unaware of breakthrough snoring.  He denies restless legs.  He denies chest pain.  He has noticed some mild episodes of dizziness in the morning.     I recommended some changes to his BiPAP therapy and change his ramp time to 5 minutes, increased but pressure support to 5.5 increased his EPAP to 9 cm water pressure.  A download was obtained from June 12 through December 14, 2017.  He is 100% compliant and averaging his hours and 24 minutes of CPAP use per night.  His average AHI is still slightly elevated 8.2.  90% of time device CPAP was 17 cm water pressure.  Though his pressure support range can from 0 to 5.5 cm, he apparently has only been needing a pressure support of 2.4 cm of water.  He feels well.  He has a F&P medium cushion full facemask but he is an oral breather and when he opens his mouth the facemask is actually too small which may be contributing to some of his increased AHI.  He presents to sleep clinic for further evaluation. He had been evaluated by Dr. Jimmy Footman for his renal issues.  He denies any awareness of recurrent atrial flutter.  He has seen Dr. Dennard Schaumann here he also has been  diagnosed with monoclonal gammopathy of undetermined significance and is monitored by Dr. Irene Limbo.   He had undergone a cardiac MRI which was ordered by Dr. Caryl Comes last year in February 2019 which showed normal LV size and function with an EF of 54%.  There was no delayed gadolinium uptake on inversion recovery sequences.  He had a trileaflet aortic valve with mild AR, mild MR, moderate RV enlargement, moderate biatrial enlargement and there was moderate LVH with the mid and basal septum measuring 14 mm compared to the posterior wall at 9 mm.  I last saw him in February 2020 at which time he denied any recurrent chest pain or abnormal heart rhythm.  He was using his BiPAP auto SV with 100% compliance.  A download was obtained in the office from January 26 through July 30, 2018.  He is 100% compliant.  He requires high pressures and his average EPAP pressure was 17 which also was his 90% pressure.  He is set up to a potential maximum IPAP pressure of 30.  His pressure support ranges from 0-5.5 with an average pressure support of 2.4 cm.  He is now using a large mask which is much more comfortable and is he is doing better.  On his most recent download, his AHI was still mildly increased at 7.1/h.  He has undergone follow-up hematologic evaluation with Dr. Velvet Bathe for his monoclonal gammopathy.    Since I last saw him, he believes that he may have had 3 brief short-lived episodes of PAF which resolved spontaneously.  He denies any associated chest pain.  Last week he underwent Mohs surgery for a skin cancer on his chin.  He was unable to use BiPAP for several days since this is where his mask would rest.  I obtained a download from July 1 through April 04, 2019.  His minimum EPAP pressure was 17 with a maximum IPAP pressure of 30 and pressure support of 5.5 cm.  Apparently his ramp start pressure is 9.5 cm.  Average AHI is 6.9.  90% of  the time his device EPAP pressure was 17.  Average breath rate was 16.1  breaths/min.  He admits to slight weight loss.  Presents for evaluation  Past Medical History  Diagnosis Date  . GERD 12/07/2006  . HYPERGLYCEMIA 12/07/2006  . HYPERLIPIDEMIA 12/07/2006    mixed wth an atherogenic dyslipidemic pattern  . HYPERTENSION 12/07/2006  . OBESITY 11/10/2009  . THROMBOCYTOPENIA 12/07/2006  . TRANSAMINASES, SERUM, ELEVATED 12/07/2006  . SLEEP APNEA 10/22/2007    uses bipap  . 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 Claiborne Billings  . Diabetes mellitus without complication   . Renal insufficiency     Cr to 3 on ACE  . Nephrolithiasis   . Normal coronary arteries 04/06/2007    by cath EF 55%., last ECHO 10/13/10 EF>55% mild MR, last nuc 06/16/10 EF 57% low risk scan  . Pulmonary HTN     RV syst.  40-1mHg- moderate by echo  . Palpitations     tachy  . PAF (paroxysmal atrial fibrillation) 04/09/2013    Past Surgical History  Procedure Laterality Date  . Cholecystectomy    . Rotator cuff repair    . Cystoscopy/retrograde/ureteroscopy  08/19/2011    Procedure: CYSTOSCOPY/RETROGRADE/URETEROSCOPY;  Surgeon: MHanley Ben MD;  [facility]  Service: Urology;  Laterality: Right;  JJ STENT PLACEMENT   . Cardiac catheterization  04/06/2007    normal cors    Allergies  Allergen Reactions  . Lisinopril     Renal insufficiency    Current Outpatient Medications:  .  atorvastatin (LIPITOR) 10 MG tablet, TAKE 1 TABLET BY MOUTH AT BEDTIME, Disp: 90 tablet, Rfl: 3 .  carvedilol (COREG) 25 MG tablet, Take 1 tablet (25 mg total) by mouth 2 (two) times daily with a meal., Disp: 180 tablet, Rfl: 1 .  diltiazem (CARDIZEM CD) 240 MG 24 hr capsule, TAKE 1 CAPSULE (240 MG TOTAL) BY MOUTH AT BEDTIME., Disp: 90 capsule, Rfl: 3 .  ELIQUIS 5 MG TABS tablet, TAKE 1 TABLET BY MOUTH TWICE A DAY NEEDS APPT FOR REFILLS, Disp: 180 tablet, Rfl: 1 .  famotidine (PEPCID) 10 MG tablet, Take 1 tablet by mouth daily., Disp: , Rfl:  .  furosemide (LASIX) 40 MG tablet,  Take 1 tablet by mouth daily., Disp: , Rfl:  .  linagliptin (TRADJENTA) 5 MG TABS tablet, Take 5 mg by mouth daily., Disp: , Rfl:  .  omega-3 acid ethyl esters (LOVAZA) 1 g capsule, TAKE 2 CAPSULES BY MOUTH EVERY DAY NEEDS APPT FOR REFILLS, Disp: 180 capsule, Rfl: 1 .  potassium citrate (UROCIT-K) 10 MEQ (1080 MG) SR tablet, Take 10 mEq by mouth 2 (two) times daily. , Disp: , Rfl:  .  sildenafil (VIAGRA) 100 MG tablet, Take 0.5-1 tablets (50-100 mg total) by mouth daily as needed for erectile dysfunction., Disp: 5 tablet, Rfl: 11  Current Facility-Administered Medications:  .  0.9 %  sodium chloride infusion, 500 mL, Intravenous, Once, [place], SCarlota Raspberry MD  Socially he is married has 4 children and 5 grandchildren. He is a distant relative to the "Faeth brothers." He does try to walk and exercise. There is no tobacco use. He does drink occasional alcohol.   ROS General: Negative; No fevers, chills, or night sweats;  HEENT: Negative; No changes in vision or hearing, sinus congestion, difficulty swallowing Pulmonary: Negative; No cough, wheezing, shortness of breath, hemoptysis Cardiovascular: See history of present illness No recent peripheral edema GI: Status post recent episode of diverticulitis requiring 2 day  hospitalization GU:  No dysuria, hematuria, or difficulty voiding; some difficulty with erectile function Musculoskeletal: Negative; no myalgias, joint pain, or weakness Hematologic/Oncology: Positive M spike with monoclonal gammopathy of undetermined significance Endocrine: Negative; no heat/cold intolerance; no diabetes Neuro: Negative; no changes in balance, headaches Skin: Negative; No rashes or skin lesions Psychiatric: Negative; No behavioral problems, depression Sleep: He is using his BiPAP Auto SV therapy with 100% compliance.  No snoring, daytime sleepiness, hypersomnolence, bruxism, restless legs, hypnogognic hallucinations, no cataplexy Other comprehensive 14 point  system review is negative.  PE BP (!) 142/72   Pulse (!) 55   Temp (!) 97.2 F (36.2 C)   Ht '5\' 11"'$  (1.803 m)   Wt 239 lb (108.4 kg)   SpO2 97%   BMI 33.33 kg/m    Repeat blood pressure by me was 130/70  Wt Readings from Last 3 Encounters:  04/05/19 239 lb (108.4 kg)  12/18/18 243 lb (110.2 kg)  11/23/18 248 lb 1.6 oz (112.5 kg)   General: Alert, oriented, no distress.  Skin: normal turgor, no rashes, warm and dry HEENT: Normocephalic, atraumatic. Pupils equal round and reactive to light; sclera anicteric; extraocular muscles intact; Nose without nasal septal hypertrophy Mouth/Parynx benign; Mallinpatti scale 3 Neck: No JVD, no carotid bruits; normal carotid upstroke Lungs: clear to ausculatation and percussion; no wheezing or rales Chest wall: without tenderness to palpitation Heart: PMI not displaced, RRR, s1 s2 normal, 1/6 systolic murmur, no diastolic murmur, no rubs, gallops, thrills, or heaves Abdomen: soft, nontender; no hepatosplenomehaly, BS+; abdominal aorta nontender and not dilated by palpation. Back: no CVA tenderness Pulses 2+ Musculoskeletal: full range of motion, normal strength, no joint deformities Extremities: no clubbing cyanosis or edema, Homan's sign negative  Neurologic: grossly nonfocal; Cranial nerves grossly wnl Psychologic: Normal mood and affect   ECG (independently read by me): Sinus Bradycardia ay 55: RBBB with repolarization.  February 25,2020 ECG (independently read by me): Sinus bradycardia at 56 bpm.  Right bundle branch block with repolarization changes.  October 03, 2017 ECG (independently read by me):  Sinus bradycardia at 50 bpm.  Right bundle branch block with repolarization changes. QTc interval 461 ms.  January 2019 ECG (independently read by me): Sinus bradycardia 56 bpm.  Possible left atrial enlargement.  Right bundle branch block with repolarization changes.  PR interval 186 ms; QTc interval 436 ms  October 2018 ECG  (independently read by me): Atrial flutter with variable block at 96 bpm.  Right bundle-branch block with repolarization changes.  QTc interval 437 ms  July 2018 ECG (independently read by me): Probable atrial flutter with a ventricular rate at 82 bpm.  Right bundle-branch block, left anterior hemiblock.  11/08/2016 ECG (independently read by me): Probable atrial flutter 3:1 block , ventricular rate at 85;  right bundle branch block with repolarization changes.  QTc interval 528 ms.   May 2018 ECG (independently read by me): atrial flutter with variable block with ventricular rate at 89 bpm.  Right bundle-branch block, left anterior hemiblock.  September 2017 ECG (independently read by me): Normal sinus rhythm at 63 bpm.  First-degree AV block with a PR interval of 208 ms.  Right bundle branch block with repolarization changes.  QTc interval 466 ms.  10/28/15 ECG (independently read by me): Sinus bradycardia at 54 bpm.  First degree block with PR interval 222 ms.  Right bundle-branch block.  10/21/2015 ECG (independently read by me): Atrial flutter with variable block.  Right bundle-branch block with repolarization changes.  October  2016 ECG (independently read by me): sinus rhythm with first-degree AV block with a PR interval at 216 ms.  Ventricular rate 86 bpm with occasional PVCs.    May 2016 ECG (independently read by me): Normal sinus rhythm at 60 bpm.  Right bundle-branch block with repolarization changes.  November 2015 ECG (independently read by me): Normal sinus rhythm at 61.  Nonspecific T abnormality.  QTc interval 394 ms.  11/18/2013 ECG (independently read by me): Sinus bradycardia 58 beats per minute.  PR interval 198 ms; QTc interval 371 ms.  Nonspecific ST changes.  07/29/2013 ECG  (independently read by me): Atrial flutter with 2:1 block with a ventricular rate of 87 beats per minute. Atrial rate is approximately 360 ms. Right bundle branch block with repolarization  changes  07/08/2013 ECG (independently read by me): Probable A. fib flutter now with right bundle branch block and repolarization changes.  Prior ECG of 06/04/2013: EKG  suggests probable atrial flutter with a ventricular rate of 105 beats per minute. There also are frequent PVCs. QTc interval is 436 ms. They're nonspecific T changes.  LABS: BMP Latest Ref Rng & Units 12/19/2018 11/09/2018 03/23/2018  Glucose 65 - 99 mg/dL 121(H) 121(H) 171(H)  BUN 7 - 25 mg/dL 34(H) 26(H) 19  Creatinine 0.70 - 1.18 mg/dL 2.02(H) 1.95(H) 1.77(H)  BUN/Creat Ratio 6 - 22 (calc) 17 - -  Sodium 135 - 146 mmol/L 141 141 141  Potassium 3.5 - 5.3 mmol/L 5.4(H) 4.2 4.1  Chloride 98 - 110 mmol/L 107 107 107  CO2 20 - 32 mmol/L '28 26 25  '$ Calcium 8.6 - 10.3 mg/dL 10.0 9.0 9.6   Hepatic Function Latest Ref Rng & Units 12/19/2018 11/09/2018 03/23/2018  Total Protein 6.1 - 8.1 g/dL 6.8 7.0 7.2  Albumin 3.5 - 5.0 g/dL - 4.1 3.9  AST 10 - 35 U/L '13 16 18  '$ ALT 9 - 46 U/L '20 20 22  '$ Alk Phosphatase 38 - 126 U/L - 37(L) 43  Total Bilirubin 0.2 - 1.2 mg/dL 0.8 1.0 0.9  Bilirubin, Direct 0.0 - 0.3 mg/dL - - -   CBC Latest Ref Rng & Units 12/19/2018 11/09/2018 03/23/2018  WBC 3.8 - 10.8 Thousand/uL 5.3 4.3 4.7  Hemoglobin 13.2 - 17.1 g/dL 14.5 12.6(L) 13.5  Hematocrit 38.5 - 50.0 % 43.8 39.7 40.9  Platelets 140 - 400 Thousand/uL 116(L) 86(L) 102(L)   Lab Results  Component Value Date   TSH 2.40 10/26/2015   Lipid Panel     Component Value Date/Time   CHOL 103 12/19/2018 0810   TRIG 167 (H) 12/19/2018 0810   HDL 36 (L) 12/19/2018 0810   CHOLHDL 2.9 12/19/2018 0810   VLDL 37 (H) 10/25/2016 0853   LDLCALC 42 12/19/2018 0810   LDLDIRECT 65.0 04/12/2013 0825     RADIOLOGY: No results found.  IMPRESSION:  1. PAF (paroxysmal atrial fibrillation) ([place])   2. OSA treated with BiPAP Auto SV   3. Anticoagulation adequate   4. Type 2 diabetes mellitus with chronic kidney disease, without long-term current use of  insulin, unspecified CKD stage ([place])   5. Right bundle branch block   6. Hyperlipidemia LDL goal <70     ASSESSMENT AND PLAN: Martin Cordova is a 78 year old Caucasian male who has documented normal coronary arteries by cardiac catheterization in 2008. He has a history of hypertension, mixed hyperlipidemia, and has paroxysmal atrial fibrillation/flutter. He has a history of diabetes mellitus and after he had lost a significant amount of weight and with  renal insufficiency he was taken off his diabetic medication.  However, due to recurrent weight gain he is now back on therapy with Tradjenta.  Underwent successful catheter ablation for recurrent atrial flutter in November 2018.  He continues to be on long-term anticoagulation with Eliquis.  He denies recurrent arrhythmia.  Dr Caryl Comes had ordered an MRI of his heart in February 2019 which did not reveal any delayed gadolinium uptake arguing against scar, infiltration or hypertrophic cardiomyopathy.  There was no S.A.M.  At present, he is maintaining sinus rhythm but admits over the past 8 months to 3 brief short-lived episodes of possible recurrent arrhythmia.  The last saw him I had recommended his minimum EPAP pressure be increased to 18, but this may not have been saved when the adjustment was made.  I will again recommend the EPAP minimum pressure be increased to 18 mm.  I am also increasing his ramp start pressure at 11 cm rather than 9.5 cm in light of his average AHI still being slightly increased at 6.9.  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  He has chronic right bundle branch block.  He continues to be on Cardizem CD 240 mg, carvedilol 25 mg, and was recently started on on furosemide 40 mg per Dr. Neoma Laming doing for blood pressure and recent leg swelling.  His leg swelling has resolved.  He is on atorvastatin for hyperlipidemia with target LDL less than 70 and lovaza.  He continues to be on Tradjenta for his  diabetes he is followed by Dr. Velvet Bathe for his monoclonal gammopathy.  I will see him in 6 months for reevaluation  Time spent: 25 minutes Shelva Majestic, MD  04/07/2019  11:57 AM

## 2019-04-07 ENCOUNTER — Encounter: Payer: Self-pay | Admitting: Cardiovascular Disease

## 2019-04-09 ENCOUNTER — Other Ambulatory Visit (INDEPENDENT_AMBULATORY_CARE_PROVIDER_SITE_OTHER): Payer: Medicare Other

## 2019-04-09 DIAGNOSIS — I472 Ventricular tachycardia: Secondary | ICD-10-CM

## 2019-04-09 DIAGNOSIS — I480 Paroxysmal atrial fibrillation: Secondary | ICD-10-CM

## 2019-04-09 DIAGNOSIS — I4892 Unspecified atrial flutter: Secondary | ICD-10-CM

## 2019-04-09 DIAGNOSIS — I4510 Unspecified right bundle-branch block: Secondary | ICD-10-CM | POA: Diagnosis not present

## 2019-04-09 DIAGNOSIS — I272 Pulmonary hypertension, unspecified: Secondary | ICD-10-CM

## 2019-04-09 DIAGNOSIS — I4729 Other ventricular tachycardia: Secondary | ICD-10-CM

## 2019-04-09 DIAGNOSIS — Z7901 Long term (current) use of anticoagulants: Secondary | ICD-10-CM | POA: Diagnosis not present

## 2019-04-09 DIAGNOSIS — G4733 Obstructive sleep apnea (adult) (pediatric): Secondary | ICD-10-CM

## 2019-04-09 DIAGNOSIS — I10 Essential (primary) hypertension: Secondary | ICD-10-CM

## 2019-04-09 DIAGNOSIS — E785 Hyperlipidemia, unspecified: Secondary | ICD-10-CM

## 2019-04-09 DIAGNOSIS — I517 Cardiomegaly: Secondary | ICD-10-CM

## 2019-04-27 ENCOUNTER — Other Ambulatory Visit: Payer: Self-pay | Admitting: Cardiovascular Disease

## 2019-04-28 ENCOUNTER — Other Ambulatory Visit: Payer: Self-pay | Admitting: Cardiovascular Disease

## 2019-05-09 ENCOUNTER — Telehealth: Payer: Self-pay | Admitting: *Deleted

## 2019-05-09 MED ORDER — OSELTAMIVIR PHOSPHATE 75 MG PO CAPS: 75.0000 mg | ORAL_CAPSULE | Freq: Two times a day (BID) | ORAL | 0 refills | Status: AC

## 2019-05-09 NOTE — Telephone Encounter (Signed)
Call placed to patient and patient made aware.   Prescription sent to pharmacy.  

## 2019-05-09 NOTE — Telephone Encounter (Signed)
Ok to call out tamiflu 75 mg pobid x 5 days for both

## 2019-05-09 NOTE — Telephone Encounter (Signed)
Received call from patient.   Patient and spouse were in contact with young granddaughter who tested positive for Influenza B on 05/09/2019.   Inquired as to if MD recommends Tamiflu. Please advise.

## 2019-05-17 ENCOUNTER — Inpatient Hospital Stay: Payer: Medicare Other | Attending: Hematology

## 2019-05-17 ENCOUNTER — Other Ambulatory Visit: Payer: Self-pay

## 2019-05-17 DIAGNOSIS — N183 Chronic kidney disease, stage 3 unspecified: Secondary | ICD-10-CM | POA: Insufficient documentation

## 2019-05-17 DIAGNOSIS — D472 Monoclonal gammopathy: Secondary | ICD-10-CM | POA: Diagnosis not present

## 2019-05-17 DIAGNOSIS — D696 Thrombocytopenia, unspecified: Secondary | ICD-10-CM

## 2019-05-17 LAB — CBC WITH DIFFERENTIAL/PLATELET
Abs Immature Granulocytes: 0.02 10*3/uL (ref 0.00–0.07)
Basophils Absolute: 0 10*3/uL (ref 0.0–0.1)
Basophils Relative: 1 %
Eosinophils Absolute: 0.1 10*3/uL (ref 0.0–0.5)
Eosinophils Relative: 3 %
HCT: 41 % (ref 39.0–52.0)
Hemoglobin: 13.3 g/dL (ref 13.0–17.0)
Immature Granulocytes: 0 %
Lymphocytes Relative: 18 %
Lymphs Abs: 0.8 10*3/uL (ref 0.7–4.0)
MCH: 30.3 pg (ref 26.0–34.0)
MCHC: 32.4 g/dL (ref 30.0–36.0)
MCV: 93.4 fL (ref 80.0–100.0)
Monocytes Absolute: 0.4 10*3/uL (ref 0.1–1.0)
Monocytes Relative: 8 %
Neutro Abs: 3.2 10*3/uL (ref 1.7–7.7)
Neutrophils Relative %: 70 %
Platelets: 99 10*3/uL — ABNORMAL LOW (ref 150–400)
RBC: 4.39 MIL/uL (ref 4.22–5.81)
RDW: 13.2 % (ref 11.5–15.5)
WBC: 4.6 10*3/uL (ref 4.0–10.5)
nRBC: 0 % (ref 0.0–0.2)

## 2019-05-17 LAB — CMP (CANCER CENTER ONLY)
ALT: 29 U/L (ref 0–44)
AST: 18 U/L (ref 15–41)
Albumin: 4.1 g/dL (ref 3.5–5.0)
Alkaline Phosphatase: 47 U/L (ref 38–126)
Anion gap: 7 (ref 5–15)
BUN: 36 mg/dL — ABNORMAL HIGH (ref 8–23)
CO2: 31 mmol/L (ref 22–32)
Calcium: 9.2 mg/dL (ref 8.9–10.3)
Chloride: 104 mmol/L (ref 98–111)
Creatinine: 2.18 mg/dL — ABNORMAL HIGH (ref 0.61–1.24)
GFR, Est AFR Am: 33 mL/min — ABNORMAL LOW (ref >60)
GFR, Estimated: 28 mL/min — ABNORMAL LOW (ref >60)
Glucose, Bld: 181 mg/dL — ABNORMAL HIGH (ref 70–99)
Potassium: 4.3 mmol/L (ref 3.5–5.1)
Sodium: 142 mmol/L (ref 135–145)
Total Bilirubin: 0.8 mg/dL (ref 0.3–1.2)
Total Protein: 7 g/dL (ref 6.5–8.1)

## 2019-05-17 LAB — IMMATURE PLATELET FRACTION: Immature Platelet Fraction: 5.6 % (ref 1.2–8.6)

## 2019-05-22 LAB — MULTIPLE MYELOMA PANEL, SERUM
Albumin SerPl Elph-Mcnc: 4.2 g/dL (ref 2.9–4.4)
Albumin/Glob SerPl: 1.6 (ref 0.7–1.7)
Alpha 1: 0.2 g/dL (ref 0.0–0.4)
Alpha2 Glob SerPl Elph-Mcnc: 0.6 g/dL (ref 0.4–1.0)
B-Globulin SerPl Elph-Mcnc: 0.9 g/dL (ref 0.7–1.3)
Gamma Glob SerPl Elph-Mcnc: 1 g/dL (ref 0.4–1.8)
Globulin, Total: 2.7 g/dL (ref 2.2–3.9)
IgA: 101 mg/dL (ref 61–437)
IgG (Immunoglobin G), Serum: 1145 mg/dL (ref 603–1613)
IgM (Immunoglobulin M), Srm: 36 mg/dL (ref 15–143)
M Protein SerPl Elph-Mcnc: 0.7 g/dL — ABNORMAL HIGH
Total Protein ELP: 6.9 g/dL (ref 6.0–8.5)

## 2019-05-24 ENCOUNTER — Telehealth: Payer: Self-pay | Admitting: Hematology

## 2019-05-24 ENCOUNTER — Other Ambulatory Visit: Payer: Self-pay

## 2019-05-24 ENCOUNTER — Inpatient Hospital Stay: Payer: Medicare Other | Admitting: Hematology

## 2019-05-24 VITALS — BP 149/66 | HR 52 | Temp 98.2°F | Resp 18 | Ht 71.0 in | Wt 244.3 lb

## 2019-05-24 DIAGNOSIS — D696 Thrombocytopenia, unspecified: Secondary | ICD-10-CM | POA: Diagnosis not present

## 2019-05-24 DIAGNOSIS — D472 Monoclonal gammopathy: Secondary | ICD-10-CM

## 2019-05-24 NOTE — Progress Notes (Signed)
HEMATOLOGY/ONCOLOGY CLINIC NOTE  Date of Service: 05/24/2019   Patient Care Team: Susy Frizzle, MD as PCP - General (Family Medicine) Troy Sine, MD as PCP - Cardiology (Cardiology)  Dr. Jeneen Rinks [place] in Nephrology  CHIEF COMPLAINTS/PURPOSE OF CONSULTATION:  Follow up for Thrombocytopenia and MGUS   HISTORY OF PRESENTING ILLNESS:   Martin Cordova is a wonderful 78 y.o. male who has been referred to [place] by Dr Claiborne Billings for evaluation and management of thrombocytopenia.  Patient has a h/o HTN, HLD, DM2, sleep apnea, GERD, CAD, obesity and NASH who was referred for evaluation of thrombocytopenia.  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  Hgb and WBC count have been WNL. No issues with bleeding or bruising, No recent new medications. He is on chronic PPI therapy. He is chronic anticoagulation for his atrial fibrillation.  No other acute new concerns.  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 The patient's last visit with [place] was on 11/23/2018. The pt reports that he is doing well overall.  The pt reports no new concerns and he is staying safe and taking precautions to avoid contact with COVID-19.  Lab results today (05/17/19) of CBC w/diff and CMP is as follows: all values are WNL except for Platelets at 99,M Protein SerPl Elph-Mcnc at 0.7, Glucose Bld at 181, BUN at 36, Creatinine at 2.18,GFR Est .  On review of systems, pt reports no new concerns and denies back pain, abdominal pain, fever, chills,  and any other symptoms.   MEDICAL HISTORY:  Past Medical History:  Diagnosis Date  . Colon polyps   . Diabetes mellitus without complication ([place])   . Diverticulitis   . Diverticulosis   . GERD 12/07/2006  . HYPERGLYCEMIA 12/07/2006  . HYPERLIPIDEMIA 12/07/2006   mixed wth an atherogenic dyslipidemic pattern  . HYPERTENSION 12/07/2006  .  Nephrolithiasis   . Nonalcoholic fatty liver disease   . Normal coronary arteries 04/06/2007   by cath EF 55%., last ECHO 10/13/10 EF>55% mild MR, last nuc 06/16/10 EF 57% low risk scan  . OBESITY 11/10/2009  . OSA treated with BiPAP 10/22/2007  . PAF (paroxysmal atrial fibrillation) ([place]) 04/09/2013  . Palpitations    tachy  . Pulmonary HTN (HCC)    RV syst.  40-54mHg- moderate by echo  . Renal insufficiency    Cr to 3 on ACE  . Sleep apnea   . SOB (shortness of breath)    with exertion  . THROMBOCYTOPENIA 12/07/2006  . TRANSAMINASES, SERUM, ELEVATED 12/07/2006  . 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 kClaiborne Billings   SURGICAL HISTORY: Past Surgical History:  Procedure Laterality Date  . A-FLUTTER ABLATION N/A 04/19/2017   Procedure: A-FLUTTER ABLATION;  Surgeon: KDeboraha Sprang MD;  [facility];  Service: Cardiovascular;  Laterality: N/A;  . CARDIAC CATHETERIZATION  04/06/2007   normal cors  . CARDIOVERSION N/A 08/05/2013   Procedure: CARDIOVERSION;  Surgeon: TTroy Sine MD;  [facility]  Service: Cardiovascular;  Laterality: N/A;  . CARDIOVERSION N/A 12/26/2016   Procedure: CARDIOVERSION;  Surgeon: KTroy Sine MD;  [facility];  Service: Cardiovascular;  Laterality: N/A;  . CHOLECYSTECTOMY    . CYSTOSCOPY/RETROGRADE/URETEROSCOPY  08/19/2011   Procedure: CYSTOSCOPY/RETROGRADE/URETEROSCOPY;  Surgeon: MHanley Ben MD;  [facility]  Service: Urology;  Laterality: Right;  JJ STENT  PLACEMENT  . ROTATOR CUFF REPAIR Right     SOCIAL HISTORY: Social History   Socioeconomic History  . Marital status: Married    Spouse name: Not on file  . Number of children: 4  . Years of education: Not on file  . Highest education level: Not on file  Occupational History  . Occupation: retired Pharmacist, hospital  Tobacco Use  . Smoking status: Former Smoker    Years: 10.00    Types: Cigarettes    Quit date: 08/04/1980     Years since quitting: 38.8  . Smokeless tobacco: Former Systems developer    Types: [place] date: 08/08/2004  Substance and Sexual Activity  . Alcohol use: Yes    Alcohol/week: 0.0 standard drinks    Comment: 1 beer 2-3 times per month  . Drug use: No  . Sexual activity: Yes  Other Topics Concern  . Not on file  Social History Narrative  . Not on file   Social Determinants of Health   Financial Resource Strain:   . Difficulty of Paying Living Expenses: Not on file  Food Insecurity:   . Worried About Charity fundraiser in the Last Year: Not on file  . Ran Out of Food in the Last Year: Not on file  Transportation Needs:   . Lack of Transportation (Medical): Not on file  . Lack of Transportation (Non-Medical): Not on file  Physical Activity:   . Days of Exercise per Week: Not on file  . Minutes of Exercise per Session: Not on file  Stress:   . Feeling of Stress : Not on file  Social Connections:   . Frequency of Communication with Friends and Family: Not on file  . Frequency of Social Gatherings with Friends and Family: Not on file  . Attends Religious Services: Not on file  . Active Member of Clubs or Organizations: Not on file  . Attends Archivist Meetings: Not on file  . Marital Status: Not on file  Intimate Partner Violence:   . Fear of Current or Ex-Partner: Not on file  . Emotionally Abused: Not on file  . Physically Abused: Not on file  . Sexually Abused: Not on file    FAMILY HISTORY: Family History  Problem Relation Age of Onset  . Diabetes Mother   . Heart failure Mother   . Stroke Mother   . Kidney failure Father   . Diabetes Father   . Cancer Paternal Uncle   . Colon cancer Neg Hx   . Esophageal cancer Neg Hx   . Stomach cancer Neg Hx   . Rectal cancer Neg Hx     ALLERGIES:  is allergic to lisinopril and ibuprofen.  MEDICATIONS:  Current Outpatient Medications  Medication Sig Dispense Refill  . atorvastatin (LIPITOR) 10 MG tablet TAKE 1  TABLET BY MOUTH AT BEDTIME 90 tablet 3  . carvedilol (COREG) 25 MG tablet Take 1 tablet (25 mg total) by mouth 2 (two) times daily with a meal. 180 tablet 1  . diltiazem (CARDIZEM CD) 240 MG 24 hr capsule TAKE 1 CAPSULE (240 MG TOTAL) BY MOUTH AT BEDTIME. 90 capsule 3  . ELIQUIS 5 MG TABS tablet TAKE 1 TABLET BY MOUTH TWICE A DAY NEEDS APPT FOR REFILLS 180 tablet 1  . famotidine (PEPCID) 10 MG tablet Take 1 tablet by mouth daily.    . furosemide (LASIX) 40 MG tablet Take 1 tablet by mouth daily.    Marland Kitchen linagliptin (TRADJENTA) 5 MG  TABS tablet Take 5 mg by mouth daily.    Marland Kitchen omega-3 acid ethyl esters (LOVAZA) 1 g capsule TAKE 2 CAPSULES BY MOUTH EVERY DAY NEEDS APPT FOR REFILLS 180 capsule 1  . potassium citrate (UROCIT-K) 10 MEQ (1080 MG) SR tablet Take 10 mEq by mouth 2 (two) times daily.     . sildenafil (VIAGRA) 100 MG tablet Take 0.5-1 tablets (50-100 mg total) by mouth daily as needed for erectile dysfunction. 5 tablet 11   Current Facility-Administered Medications  Medication Dose Route Frequency Provider Last Rate Last Admin  . 0.9 %  sodium chloride infusion  500 mL Intravenous Once Armbruster, Carlota Raspberry, MD        REVIEW OF SYSTEMS:  A 10+ POINT REVIEW OF SYSTEMS WAS OBTAINED including neurology, dermatology, psychiatry, cardiac, respiratory, lymph, extremities, GI, GU, Musculoskeletal, constitutional, breasts, reproductive, HEENT.  All pertinent positives are noted in the HPI.  All others are negative.   PHYSICAL EXAMINATION: ECOG FS:1 - Symptomatic but completely ambulatory  Vitals:   05/24/19 0911  BP: (!) 149/66  Pulse: (!) 52  Resp: 18  Temp: 98.2 F (36.8 C)  SpO2: 100%   Wt Readings from Last 3 Encounters:  05/24/19 244 lb 4.8 oz (110.8 kg)  04/05/19 239 lb (108.4 kg)  12/18/18 243 lb (110.2 kg)   Body mass index is 34.07 kg/m.    GENERAL:alert, in no acute distress and comfortable SKIN: no acute rashes, no significant lesions EYES: conjunctiva are pink and  non-injected, sclera anicteric OROPHARYNX: MMM, no exudates, no oropharyngeal erythema or ulceration NECK: supple, no JVD LYMPH:  no palpable lymphadenopathy in the cervical, axillary or inguinal regions LUNGS: clear to auscultation b/l with normal respiratory effort HEART: regular rate & rhythm ABDOMEN:  normoactive bowel sounds , non tender, not distended. Extremity: no pedal edema PSYCH: alert & oriented x 3 with fluent speech NEURO: no focal motor/sensory deficits  LABORATORY DATA:  I have reviewed the data as listed  Component     Latest Ref Rng & Units 11/16/2015  WBC     4.0 - 10.3 10e3/uL 4.8  NEUT#     1.5 - 6.5 10e3/uL 3.2  Hemoglobin     13.0 - 17.1 g/dL 14.0  HCT     38.4 - 49.9 % 41.2  Platelets     140 - 400 10e3/uL 100 (L)  MCV     79.3 - 98.0 fL 91.6  MCH     27.2 - 33.4 pg 31.1  MCHC     32.0 - 36.0 g/dL 34.0  RBC     4.20 - 5.82 10e6/uL 4.50  RDW     11.0 - 14.6 % 13.2  lymph#     0.9 - 3.3 10e3/uL 1.0  MONO#     0.1 - 0.9 10e3/uL 0.5  Eosinophils Absolute     0.0 - 0.5 10e3/uL 0.1  Basophils Absolute     0.0 - 0.1 10e3/uL 0.0  NEUT%     39.0 - 75.0 % 66.0  LYMPH%     14.0 - 49.0 % 21.3  MONO%     0.0 - 14.0 % 10.0  EOS%     0.0 - 7.0 % 2.3  BASO%     0.0 - 2.0 % 0.4  Retic %     0.80 - 1.80 % 1.40  Retic Ct Abs     34.80 - 93.90 10e3/uL 63.00  Immature Retic Fract     3.00 - 10.60 % 5.20  Sodium     136 - 145 mEq/L 143  Potassium     3.5 - 5.1 mEq/L 4.8  Chloride     98 - 109 mEq/L 109  CO2     22 - 29 mEq/L 30 (H)  Glucose     70 - 140 mg/dl 85  BUN     7.0 - 26.0 mg/dL 22.6  Creatinine     0.7 - 1.3 mg/dL 1.7 (H)  Total Bilirubin     0.20 - 1.20 mg/dL 0.88  Alkaline Phosphatase     40 - 150 U/L 38 (L)  AST     5 - 34 U/L 17  ALT     0 - 55 U/L 24  Total Protein     6.4 - 8.3 g/dL 7.0  Albumin     3.5 - 5.0 g/dL 3.8  Calcium     8.4 - 10.4 mg/dL 9.5  Anion gap     3 - 11 mEq/L 4  EGFR     >90 ml/min/1.73 m2  40 (L)  HCV Ab     0.0 - 0.9 s/co ratio <0.1  Comment      Comment  HIV     Non Reactive Non Reactive  Vitamin B12     211 - 946 pg/mL 583  . CBC Latest Ref Rng & Units 05/17/2019 12/19/2018 11/09/2018  WBC 4.0 - 10.5 K/uL 4.6 5.3 4.3  Hemoglobin 13.0 - 17.0 g/dL 13.3 14.5 12.6(L)  Hematocrit 39.0 - 52.0 % 41.0 43.8 39.7  Platelets 150 - 400 K/uL 99(L) 116(L) 86(L)     . CBC    Component Value Date/Time   WBC 4.6 05/17/2019 0804   RBC 4.39 05/17/2019 0804   HGB 13.3 05/17/2019 0804   HGB 13.5 04/10/2017 0854   HGB 14.2 05/17/2016 0756   HCT 41.0 05/17/2019 0804   HCT 41.1 04/10/2017 0854   HCT 42.5 05/17/2016 0756   PLT 99 (L) 05/17/2019 0804   PLT 145 (L) 04/10/2017 0854   MCV 93.4 05/17/2019 0804   MCV 91 04/10/2017 0854   MCV 92.4 05/17/2016 0756   MCH 30.3 05/17/2019 0804   MCHC 32.4 05/17/2019 0804   RDW 13.2 05/17/2019 0804   RDW 14.8 04/10/2017 0854   RDW 12.8 05/17/2016 0756   LYMPHSABS 0.8 05/17/2019 0804   LYMPHSABS 0.8 04/10/2017 0854   LYMPHSABS 1.1 05/17/2016 0756   MONOABS 0.4 05/17/2019 0804   MONOABS 0.7 05/17/2016 0756   EOSABS 0.1 05/17/2019 0804   EOSABS 0.2 04/10/2017 0854   BASOSABS 0.0 05/17/2019 0804   BASOSABS 0.0 04/10/2017 0854   BASOSABS 0.0 05/17/2016 0756    . CMP Latest Ref Rng & Units 05/17/2019 12/19/2018 11/09/2018  Glucose 70 - 99 mg/dL 181(H) 121(H) 121(H)  BUN 8 - 23 mg/dL 36(H) 34(H) 26(H)  Creatinine 0.61 - 1.24 mg/dL 2.18(H) 2.02(H) 1.95(H)  Sodium 135 - 145 mmol/L 142 141 141  Potassium 3.5 - 5.1 mmol/L 4.3 5.4(H) 4.2  Chloride 98 - 111 mmol/L 104 107 107  CO2 22 - 32 mmol/L _0 Calcium 8.9 - 10.3 mg/dL 9.2 10.0 9.0  Total Protein 6.5 - 8.1 g/dL 7.0 6.8 7.0  Total Bilirubin 0.3 - 1.2 mg/dL 0.8 0.8 1.0  Alkaline Phos 38 - 126 U/L 47 - 37(L)  AST 15 - 41 U/L _1 ALT 0 - 44 U/L _2 01/25/2019 ECHOCARDIOGRAM  OUTSIDE LABS from [facility] 08/07/17  RADIOGRAPHIC STUDIES: I have personally  reviewed the radiological images as listed and agreed with the findings in the report.  US Renal 08/10/17  IMPRESSION: Multiple renal cysts of varying sizes are noted bilaterally consistent with polycystic kidney disease. Increased echogenicity of renal parenchyma is noted consistent with medical renal disease. No hydronephrosis or renal obstruction is noted   ABDOMEN ULTRASOUND COMPLETE  COMPARISON:  CT urogram of August 15, 2011 and a right upper quadrant ultrasound of September 23, 2008  FINDINGS: Gallbladder: The gallbladder is surgically absent.  Common bile duct: Diameter: 5 mm where visualized  Liver: Bowel gas obscures the left hepatic lobe. The echotexture of the liver is somewhat heterogeneously increased. There is no discrete mass or ductal dilation.  IVC: Largely obscured by bowel gas.  Pancreas: Obscured by bowel gas.  Spleen: Normal in size and echotexture  Right Kidney: Length: 11.3 cm. The renal cortical echotexture is increased. There are multiple cysts in the right kidney with the largest measuring 5.6 x 4.3 x 4.2 cm. No hydronephrosis is observed.  Left Kidney: Length: 21 cm. The renal cortical echotexture is increased. There are multiple cysts with the largest measuring 9.1 x 6.5 x 9.2 cm.  Abdominal aorta: No aneurysm visualized. Limited visualization due to bowel gas.  Other findings: None.  IMPRESSION: 1. Limited visualization of the liver. The left lobe is not well visualized. The hepatic echotexture is heterogeneously increased without evidence of a discrete mass. The gallbladder is surgically absent. 2. Large kidneys with increased parenchymal echotexture and multiple large cysts. These findings have been demonstrated in the past. 3. Limited visualization of the CBD, IVC, abdominal aorta, and pancreas due to bowel gas and the patient's body habitus.   .No results found.    ASSESSMENT & PLAN:   78 y.o. caucasian male with  multiple medical co-morbidities as noted above with   1) Chronic mild Isolated thrombocytopenia ( plt 90-110k).  Patient thrombocytopenia hasn't changed significantly since 2014. 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 Medication effect could be an additional element (propafenone, ppi ) Cannot r/o an immune component. No evidence of pseudothrombocytopenia on PBS. HIV and Hep C neg B12 wnl  2) MGUS - IgG Lambda  09/2017 24 urine showed M-Protein positive in his urine at 0.9g/mL  10/23/17 Bone Survey revealed No evidence of multiple myeloma. Multifocal degenerative changes as described.  Component     Latest Ref Rng & Units 10/17/2017  IgG (Immunoglobin G), Serum     700 - 1,600 mg/dL 1,171  IgA     61 - 437 mg/dL 104  IgM (Immunoglobulin M), Srm     15 - 143 mg/dL 37  Total Protein ELP     6.0 - 8.5 g/dL 7.1  Albumin SerPl Elph-Mcnc     2.9 - 4.4 g/dL 3.9  Alpha 1     0.0 - 0.4 g/dL 0.2  Alpha2 Glob SerPl Elph-Mcnc     0.4 - 1.0 g/dL 0.7  B-Globulin SerPl Elph-Mcnc     0.7 - 1.3 g/dL 1.0  Gamma Glob SerPl Elph-Mcnc     0.4 - 1.8 g/dL 1.2  M Protein SerPl Elph-Mcnc     Not Observed g/dL 0.9 (H)  Globulin, Total     2.2 - 3.9 g/dL 3.2  Albumin/Glob SerPl     0.7 - 1.7 1.3  IFE 1  Comment  Please Note (HCV):      Comment  Kappa free light chain     3.3 - 19.4 mg/L 32.7 (H)  Lamda free light chains     5.7 - 26.3 mg/L 95.0 (H)  Kappa, lamda light chain ratio     0.26 - 1.65 0.34  Sed Rate     0 - 16 mm/hr 2  Beta-2 Microglobulin     0.6 - 2.4 mg/L 3.0 (H)  LDH     125 - 245 U/L 390 (H)    3) CKD Stage III, nephrolithiasis, Polycystic Kidneys -Pt's father died from renal failure in his 75s.  -cysts were seen on 08/10/17 US renal  -He will continue to be seen by urologist who suggested he go on a low salt and low  sugar diet.    PLAN  -Discussed pt labwork today, 05/17/2019;  all values are WNL except for Platelets at 99,M Protein SerPl Elph-Mcnc at 0.7, Glucose Bld at 181, BUN at 36, Creatinine at 2.18,GFR Est -Discussed 05/17/2019 IFE 1 "Immunofixation shows IgG monoclonal protein with lambda light chain specificity. " -Discussed 05/17/2019 Platelets at 99  -Discussed 05/17/2019 M protein at 0.7, not changing /stable -Discussed 05/17/2019 Hemoglobin at 13.3   -Discussed 05/17/2019 Creatinine at 2.18 -Discussed 05/17/2019 blood chemistries and blood counts are stable. There are currently no concerns for progression of Thrombocytopenia and MGUS. -Discussed starting 1 year follow ups since there has been little to no change within the past year of monitoring his thrombocytopenia and MGUS. Advised that a bone marrow biopsy is not needed given his labs are stable. -Discussed taking the influenza vaccination, he has had the vaccination this year.   -Will see back in 12 months.  FOLLOW UP: RTC with Dr Irene Limbo with labs in 12 months. Plz schedule labs 1 week prior to clinic visit  The total time spent in the appt was 15 minutes and more than 50% was on counseling and direct patient cares.  All of the patient's questions were answered with apparent satisfaction. The patient knows to call the clinic with any problems, questions or concerns.      Sullivan Lone MD MS AAHIVMS [facility] Hematology/Oncology Physician [facility]  (Office):       [phone] (Work cell):  [phone] (Fax):           [phone]  I, Scot Dock, am acting as a scribe for Dr. Sullivan Lone.   .I have reviewed the above documentation for accuracy and completeness, and I agree with the above. Brunetta Genera MD

## 2019-05-24 NOTE — Telephone Encounter (Signed)
Scheduled appt per 12/18 los.  Printed calendar

## 2019-06-26 ENCOUNTER — Ambulatory Visit: Payer: Medicare PPO | Attending: Internal Medicine

## 2019-06-26 DIAGNOSIS — Z23 Encounter for immunization: Secondary | ICD-10-CM | POA: Insufficient documentation

## 2019-06-26 NOTE — Progress Notes (Signed)
   Covid-19 Vaccination Clinic  Name:  YUKI PURVES    MRN: [id number] DOB: 10-10-1940  06/26/2019  Mr. Touchet was observed post Covid-19 immunization for 15 minutes without incidence. He was provided with Vaccine Information Sheet and instruction to access the V-Safe system.   Mr. Jokerst was instructed to call 911 with any severe reactions post vaccine: Marland Kitchen Difficulty breathing  . Swelling of your face and throat  . A fast heartbeat  . A bad rash all over your body  . Dizziness and weakness    Immunizations Administered    Name Date Dose VIS Date Route   Pfizer COVID-19 Vaccine 06/26/2019 11:52 AM 0.3 mL 05/17/2019 Intramuscular   Manufacturer: [place]   Lot: [id number]   [place]: [id number]

## 2019-07-17 ENCOUNTER — Ambulatory Visit: Payer: Medicare PPO | Attending: Internal Medicine

## 2019-07-17 DIAGNOSIS — Z23 Encounter for immunization: Secondary | ICD-10-CM | POA: Insufficient documentation

## 2019-07-17 NOTE — Progress Notes (Signed)
   Covid-19 Vaccination Clinic  Name:  Martin Cordova    MRN: [id number] DOB: 1940-06-13  07/17/2019  Mr. Colquhoun was observed post Covid-19 immunization for 15 minutes without incidence. He was provided with Vaccine Information Sheet and instruction to access the V-Safe system.   Mr. Settlemire was instructed to call 911 with any severe reactions post vaccine: Marland Kitchen Difficulty breathing  . Swelling of your face and throat  . A fast heartbeat  . A bad rash all over your body  . Dizziness and weakness    Immunizations Administered    Name Date Dose VIS Date Route   Pfizer COVID-19 Vaccine 07/17/2019  5:13 PM 0.3 mL 05/17/2019 Intramuscular   Manufacturer: [place]   Lot: [id number]   [place]: [id number]

## 2019-09-28 ENCOUNTER — Other Ambulatory Visit: Payer: Self-pay | Admitting: Cardiovascular Disease

## 2019-10-08 ENCOUNTER — Other Ambulatory Visit: Payer: Self-pay

## 2019-10-08 ENCOUNTER — Encounter: Payer: Self-pay | Admitting: Cardiovascular Disease

## 2019-10-08 ENCOUNTER — Ambulatory Visit: Payer: Medicare PPO | Admitting: Cardiovascular Disease

## 2019-10-08 VITALS — BP 140/82 | HR 63 | Temp 96.1°F | Ht 71.0 in | Wt 222.6 lb

## 2019-10-08 DIAGNOSIS — I4510 Unspecified right bundle-branch block: Secondary | ICD-10-CM | POA: Diagnosis not present

## 2019-10-08 DIAGNOSIS — I10 Essential (primary) hypertension: Secondary | ICD-10-CM

## 2019-10-08 DIAGNOSIS — I480 Paroxysmal atrial fibrillation: Secondary | ICD-10-CM

## 2019-10-08 DIAGNOSIS — N184 Chronic kidney disease, stage 4 (severe): Secondary | ICD-10-CM

## 2019-10-08 DIAGNOSIS — G4733 Obstructive sleep apnea (adult) (pediatric): Secondary | ICD-10-CM | POA: Diagnosis not present

## 2019-10-08 DIAGNOSIS — E785 Hyperlipidemia, unspecified: Secondary | ICD-10-CM

## 2019-10-08 DIAGNOSIS — E1122 Type 2 diabetes mellitus with diabetic chronic kidney disease: Secondary | ICD-10-CM

## 2019-10-08 NOTE — Patient Instructions (Signed)
Medication Instructions:  NO CHANGES  Follow-Up: At [facility], you and your health needs are our priority.  As part of our continuing mission to provide you with exceptional heart care, we have created designated Provider Care Teams.  These Care Teams include your primary Cardiologist (physician) and Advanced Practice Providers (APPs -  Physician Assistants and Nurse Practitioners) who all work together to provide you with the care you need, when you need it.  We recommend signing up for the patient portal called "MyChart".  Sign up information is provided on this After Visit Summary.  MyChart is used to connect with patients for Virtual Visits (Telemedicine).  Patients are able to view lab/test results, encounter notes, upcoming appointments, etc.  Non-urgent messages can be sent to your provider as well.   To learn more about what you can do with MyChart, go to [email or web address].    Your next appointment:   6 month(s)  The format for your next appointment:   In Person  Provider:   Shelva Majestic, MD

## 2019-10-08 NOTE — Progress Notes (Signed)
Patient ID: Martin Cordova, male   DOB: 02-16-1941, 79 y.o.   MRN: [id number]      HPI: Martin Cordova is a 79 y.o. male is who presents to the office today for an 7 month follow-up cardiology/sleep clinic evaluation.  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 In the past, he developed an obstructive uropathy attributed to kidney stones; creatinine had risen up to 3 and ultimately improved and stabilized at approximately 1.7. Martin Cordova uses his BiPAP daily and does note good sleep.  In the past he had issues with mild peripheral edema, which ultimately improved.  He had been previously taken off his lisinopril when his creatinine had risen in the setting of his obstructive uropathy.  In 2015 he developed recurrent episodes of palpitations and was found to have recurrent paroxysmal atrial fibrillation/flutter.  His medications were adjusted and  he was started on antiarrhythmic therapy with Rythmol to take in addition to his increasing doses of carvedilol.  He was started on Eliquis for anticoagulation.  He underwent successful cardioversion on 08/05/2013.  Since that time, he is unaware of any recurrent atrial fibrillation.  He has noticed more energy. He had been maintained on 50 mg twice a day of carvedilol in addition to Rythmol 225 mg every 8 hours, but due to slow pulse rates, these doses have ultimately been reduced.  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 He has been on CPAP therapy initially for approximately 8 years and has been on BiPAP auto SV for over 4 years. He admits to using his BiPAP with 100% compliance.  He obtained a new BiPAP machine in June.  He feels that his machine is  significantly improved from his prior one. When I last saw him I reviewed his  download of his new BiPAP auto SV machine indicates that 90% of the time is EPAP pressure was 17 and 90% of the time his pressure support was 5.8, giving an IPAP of 21.8 cm.  He is using it 100% of the time and is averaging 5 hours and 49 minutes on his most recent download from 12/07/2013 through 01/05/2014.  His average AHI was 7.5.  His device setting maximum BiPAP pressure is 25 cm in maximum EPAP pressure 21 cm.  His average central event index is 1.3, and average obstructive apneic index 0.2, with an average copy index of 6.0.  Average breath.  Rate is 18 breaths per minute with a minute ventilation of 12.1.  Average total body may 670 mL.  When I saw him on 10/21/15 he was unaware of any arrhythmia.  He was on carvedilol 12.5 mg twice a day, Rythmol 225 mg twice a day, hydralazine 25 mg twice a day and eliquis  5 mg twice a day.  He also has been taking omeprazole for GERD and atorvastatin 10 mg for hyperlipidemia. He was found to have recurrent atrial flutter with variable block.  At that time, I recommended that he increase his Rythmol and take 225 mg every 8 hours and further increased his carvedilol to 18.75 mg twice a day.  Laboratory had revealed a magnesium of 2.0.  He has stage III chronic kidney disease  and his BUN was 26, creatinine 1.71 with a GFR estimate of 39.  Potassium was 4.8.  TSH was normal at 2.4.  Insomnia.  One week later in follow-up he had reverted back to sinus rhythm..  When seen in September 2017, he was maintaining sinus rhythm.  Since then, he admits to weight gain.  In early April, he began to notice some increasing shortness of breath and elevated heart rate and fatigue.  He was seen by Hao Meng on 09/07/2016 and was found to be back in atrial flutter with a ventricular rate at 90.  He has been on eloquence 5 g twice a day for anticoagulation and was on carvedilol 18.75 twice a day.  Carvedilol  dose was increased to 25 mg twice a day.  He was also told at that time to try increasing his Propofenone to 300 mg 3 times a day.  He took this for several days but did not tolerate the increased dose and reduced it back to its previous dose of 225 mg every 8 hours.  When I saw him on 10/20/2016  I increased carvedilol to 37.5 mg twice a day.  He has continued to use his BiPAP therapy for sleep apnea.    When I saw him in June 2018 he was still in atrial flutter with 3:1 block.  I discontinued hydralazine and started him on Cardizem CD 180 mg, both for blood pressure and potential antiarrhythmic therapy. He also was having ankle edema and I started him on low-dose hydrochlorothiazide.  His peripheral edema significantly improved.  He has felt better.  He denied episodes of chest tightness.  He underwent successful outpatient DC cardioversion by me on 12/26/2016.  He successfully cardioverted at 120 joules.  When he returned for a follow-up evaluation on 01/12/2017 and saw  Hao Meng, PAC .  He was back in atrial flutter.  At that time his propafenone was discontinued.  Over the past several months, Martin Cordova has felt well.  At times there is some trace swelling in his ankles.  He continues to use his BiPAP with 100% compliance.  He was hospitalized on September 19 with an episode of diverticulitis and was discharged on 02/24/2017.   When I saw him in October 2018, he was back in atrial flutter.  At that time, I recommended consideration for atrial flutter ablation.  He was evaluated by Dr. Klein and on 04/19/2017 underwent successful atrial flutter ablation across the caval tricuspid isthmus, which interrupted bidirectional conduction.  Initial plan was for him to continue anticoagulation for 4 weeks, but unfortunately he developed irregular tachycardia palpitations about 48 hours after the procedure.  He's been found to have paroxysmal atrial fibrillation.  He as result is on chronic anticoagulation  therapy.  In November 2018 an echo Doppler study showed an EF of 40-45%.  He has been on carvedilol 25 mg twice a day and takes an extra carvedilol as needed.  He has had recurrent short burst of atrial fibrillation, but had an episode for almost an entire day.  He received a new BiPAP machine after his previous machine completely malfunction.  He admits to 100% compliance.  [facility] is his DME company.  He has diabetes mellitus and was started back on Tradjenta for hemoglobin A1c of 7.    He has a [employer] BiPAP auto SV unit. I obtained a download from September 02, 2017 through October 01, 2017.  He is compliant.  His average CPAP pressure   was 16.8 and average pressure support pressure was 4.1.  90% of the time device EPAP was 17 cm.  AHI, however, was still mildly increased at 8.9.  He is unaware of breakthrough snoring.  He denies restless legs.  He denies chest pain.  He has noticed some mild episodes of dizziness in the morning.     I recommended some changes to his BiPAP therapy and change his ramp time to 5 minutes, increased but pressure support to 5.5 increased his EPAP to 9 cm water pressure.  A download was obtained from June 12 through December 14, 2017.  He is 100% compliant and averaging his hours and 24 minutes of CPAP use per night.  His average AHI is still slightly elevated 8.2.  90% of time device CPAP was 17 cm water pressure.  Though his pressure support range can from 0 to 5.5 cm, he apparently has only been needing a pressure support of 2.4 cm of water.  He feels well.  He has a F&P medium cushion full facemask but he is an oral breather and when he opens his mouth the facemask is actually too small which may be contributing to some of his increased AHI.  He presents to sleep clinic for further evaluation. He had been evaluated by Dr. Jimmy Footman for his renal issues.  He denies any awareness of recurrent atrial flutter.  He has seen Dr. Dennard Schaumann here he also has been  diagnosed with monoclonal gammopathy of undetermined significance and is monitored by Dr. Irene Limbo.   He had undergone a cardiac MRI which was ordered by Dr. Caryl Comes last year in February 2019 which showed normal LV size and function with an EF of 54%.  There was no delayed gadolinium uptake on inversion recovery sequences.  He had a trileaflet aortic valve with mild AR, mild MR, moderate RV enlargement, moderate biatrial enlargement and there was moderate LVH with the mid and basal septum measuring 14 mm compared to the posterior wall at 9 mm.  I last saw him in February 2020 at which time he denied any recurrent chest pain or abnormal heart rhythm.  He was using his BiPAP auto SV with 100% compliance.  A download was obtained in the office from January 26 through July 30, 2018.  He is 100% compliant.  He requires high pressures and his average EPAP pressure was 17 which also was his 90% pressure.  He is set up to a potential maximum IPAP pressure of 30.  His pressure support ranges from 0-5.5 with an average pressure support of 2.4 cm.  He is now using a large mask which is much more comfortable and is he is doing better.  On his most recent download, his AHI was still mildly increased at 7.1/h.  He has undergone follow-up hematologic evaluation with Dr. Velvet Bathe for his monoclonal gammopathy.    I last saw him on April 05, 2019.  Since his prior evaluation he felt that he may have had 3 brief short-lived episodes of PAF which resolved spontaneously.  He denied any associated chest pain. He underwent Mohs surgery for a skin cancer on his chin.  He was unable to use BiPAP for several days since this is where his mask would rest.  I obtained a download from July 1 through April 04, 2019.  His minimum EPAP pressure was 17 with a maximum IPAP pressure of 30 and pressure support of 5.5 cm.  Apparently his ramp start pressure is 9.5 cm.  Average AHI  is 6.9.  90% of the time his device EPAP pressure was 17.  Average  breath rate was 16.1 breaths/min.   Echo Doppler study on January 25, 2019 showed an EF of 60 to 65% with mild LVH.  Normal diastolic parameters.  There was moderate aortic sclerosis without stenosis and mild AR.  Since I last saw him, he has felt well.  He is followed by nephrology with stage IV chronic kidney disease with recent creatinine in February 2021 at 2.59.  He is unaware of any recurrent atrial fibrillation or flutter.  He continues to use BiPAP with 100% compliance.  He is followed by Dr. Buddy Duty for his diabetes mellitus.  He presents for reevaluation.    Past Medical History  Diagnosis Date  . GERD 12/07/2006  . HYPERGLYCEMIA 12/07/2006  . HYPERLIPIDEMIA 12/07/2006    mixed wth an atherogenic dyslipidemic pattern  . HYPERTENSION 12/07/2006  . OBESITY 11/10/2009  . THROMBOCYTOPENIA 12/07/2006  . TRANSAMINASES, SERUM, ELEVATED 12/07/2006  . SLEEP APNEA 10/22/2007    uses bipap  . 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 Claiborne Billings  . Diabetes mellitus without complication   . Renal insufficiency     Cr to 3 on ACE  . Nephrolithiasis   . Normal coronary arteries 04/06/2007    by cath EF 55%., last ECHO 10/13/10 EF>55% mild MR, last nuc 06/16/10 EF 57% low risk scan  . Pulmonary HTN     RV syst.  40-41mHg- moderate by echo  . Palpitations     tachy  . PAF (paroxysmal atrial fibrillation) 04/09/2013    Past Surgical History  Procedure Laterality Date  . Cholecystectomy    . Rotator cuff repair    . Cystoscopy/retrograde/ureteroscopy  08/19/2011    Procedure: CYSTOSCOPY/RETROGRADE/URETEROSCOPY;  Surgeon: MHanley Ben MD;  [facility]  Service: Urology;  Laterality: Right;  JJ STENT PLACEMENT   . Cardiac catheterization  04/06/2007    normal cors    Allergies  Allergen Reactions  . Lisinopril     Renal insufficiency    Current Outpatient Medications:  .  atorvastatin (LIPITOR) 10 MG tablet, TAKE 1 TABLET BY MOUTH AT BEDTIME, Disp: 90 tablet,  Rfl: 3 .  calcitRIOL (ROCALTROL) 0.25 MCG capsule, , Disp: , Rfl:  .  carvedilol (COREG) 25 MG tablet, Take 1 tablet (25 mg total) by mouth 2 (two) times daily with a meal., Disp: 180 tablet, Rfl: 1 .  diltiazem (CARDIZEM CD) 240 MG 24 hr capsule, TAKE 1 CAPSULE (240 MG TOTAL) BY MOUTH AT BEDTIME., Disp: 90 capsule, Rfl: 3 .  ELIQUIS 5 MG TABS tablet, TAKE 1 TABLET BY MOUTH TWICE A DAY NEEDS APPT FOR REFILLS, Disp: 180 tablet, Rfl: 1 .  famotidine (PEPCID) 10 MG tablet, Take 1 tablet by mouth daily., Disp: , Rfl:  .  furosemide (LASIX) 40 MG tablet, Take 1 tablet by mouth daily., Disp: , Rfl:  .  linagliptin (TRADJENTA) 5 MG TABS tablet, Take 5 mg by mouth daily., Disp: , Rfl:  .  omega-3 acid ethyl esters (LOVAZA) 1 g capsule, TAKE 2 CAPSULES BY MOUTH EVERY DAY NEEDS APPT FOR REFILLS, Disp: 180 capsule, Rfl: 1 .  potassium citrate (UROCIT-K) 10 MEQ (1080 MG) SR tablet, Take 10 mEq by mouth 2 (two) times daily. , Disp: , Rfl:  .  sildenafil (VIAGRA) 100 MG tablet, Take 0.5-1 tablets (50-100 mg total) by mouth daily as needed for erectile dysfunction., Disp: 5 tablet, Rfl: 11  Current Facility-Administered  Medications:  .  0.9 %  sodium chloride infusion, 500 mL, Intravenous, Once, [place], Carlota Raspberry, MD  Socially he is married has 4 children and 5 grandchildren. He is a distant relative to the "Advani brothers." He does try to walk and exercise. There is no tobacco use. He does drink occasional alcohol.   ROS General: Negative; No fevers, chills, or night sweats;  HEENT: Negative; No changes in vision or hearing, sinus congestion, difficulty swallowing Pulmonary: Negative; No cough, wheezing, shortness of breath, hemoptysis Cardiovascular: See history of present illness No recent peripheral edema GI: Status post recent episode of diverticulitis requiring 2 day hospitalization GU:  No dysuria, hematuria, or difficulty voiding; some difficulty with erectile function Musculoskeletal:  Negative; no myalgias, joint pain, or weakness Hematologic/Oncology: Positive M spike with monoclonal gammopathy of undetermined significance Endocrine: Negative; no heat/cold intolerance; no diabetes Neuro: Negative; no changes in balance, headaches Skin: Negative; No rashes or skin lesions Psychiatric: Negative; No behavioral problems, depression Sleep: He is using his BiPAP Auto SV therapy with 100% compliance.  No snoring, daytime sleepiness, hypersomnolence, bruxism, restless legs, hypnogognic hallucinations, no cataplexy Other comprehensive 14 point system review is negative.  PE BP 140/82   Pulse 63   Temp (!) 96.1 F (35.6 C)   Ht '5\' 11"'$  (1.803 m)   Wt 222 lb 9.6 oz (101 kg)   SpO2 98%   BMI 31.05 kg/m    Repeat blood pressure by me 130/78  Wt Readings from Last 3 Encounters:  10/08/19 222 lb 9.6 oz (101 kg)  05/24/19 244 lb 4.8 oz (110.8 kg)  04/05/19 239 lb (108.4 kg)   General: Alert, oriented, no distress.  Skin: normal turgor, no rashes, warm and dry HEENT: Normocephalic, atraumatic. Pupils equal round and reactive to light; sclera anicteric; extraocular muscles intact;  Nose without nasal septal hypertrophy Mouth/Parynx benign; Mallinpatti scale 3 Neck: No JVD, no carotid bruits; normal carotid upstroke Lungs: clear to ausculatation and percussion; no wheezing or rales Chest wall: without tenderness to palpitation Heart: PMI not displaced, RRR, s1 s2 normal, 1/6 systolic murmur, no diastolic murmur, no rubs, gallops, thrills, or heaves Abdomen: soft, nontender; no hepatosplenomehaly, BS+; abdominal aorta nontender and not dilated by palpation. Back: no CVA tenderness Pulses 2+ Musculoskeletal: full range of motion, normal strength, no joint deformities Extremities: no clubbing cyanosis or edema, Homan's sign negative  Neurologic: grossly nonfocal; Cranial nerves grossly wnl Psychologic: Normal mood and affect   ECG (independently read by me): Normal sinus  rhythm at 63 bpm with premature complex.  Right bundle branch block with repolarization changes.  Normal intervals.  April 05, 2019 ECG (independently read by me): Sinus Bradycardia ay 55: RBBB with repolarization.  February 25,2020 ECG (independently read by me): Sinus bradycardia at 56 bpm.  Right bundle branch block with repolarization changes.  October 03, 2017 ECG (independently read by me):  Sinus bradycardia at 50 bpm.  Right bundle branch block with repolarization changes. QTc interval 461 ms.  January 2019 ECG (independently read by me): Sinus bradycardia 56 bpm.  Possible left atrial enlargement.  Right bundle branch block with repolarization changes.  PR interval 186 ms; QTc interval 436 ms  October 2018 ECG (independently read by me): Atrial flutter with variable block at 96 bpm.  Right bundle-branch block with repolarization changes.  QTc interval 437 ms  July 2018 ECG (independently read by me): Probable atrial flutter with a ventricular rate at 82 bpm.  Right bundle-branch block, left anterior hemiblock.  11/08/2016  ECG (independently read by me): Probable atrial flutter 3:1 block , ventricular rate at 85;  right bundle branch block with repolarization changes.  QTc interval 528 ms.   May 2018 ECG (independently read by me): atrial flutter with variable block with ventricular rate at 89 bpm.  Right bundle-branch block, left anterior hemiblock.  September 2017 ECG (independently read by me): Normal sinus rhythm at 63 bpm.  First-degree AV block with a PR interval of 208 ms.  Right bundle branch block with repolarization changes.  QTc interval 466 ms.  10/28/15 ECG (independently read by me): Sinus bradycardia at 54 bpm.  First degree block with PR interval 222 ms.  Right bundle-branch block.  10/21/2015 ECG (independently read by me): Atrial flutter with variable block.  Right bundle-branch block with repolarization changes.  October 2016 ECG (independently read by me): sinus  rhythm with first-degree AV block with a PR interval at 216 ms.  Ventricular rate 86 bpm with occasional PVCs.    May 2016 ECG (independently read by me): Normal sinus rhythm at 60 bpm.  Right bundle-branch block with repolarization changes.  November 2015 ECG (independently read by me): Normal sinus rhythm at 61.  Nonspecific T abnormality.  QTc interval 394 ms.  11/18/2013 ECG (independently read by me): Sinus bradycardia 58 beats per minute.  PR interval 198 ms; QTc interval 371 ms.  Nonspecific ST changes.  07/29/2013 ECG  (independently read by me): Atrial flutter with 2:1 block with a ventricular rate of 87 beats per minute. Atrial rate is approximately 360 ms. 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 fib flutter now with right bundle branch block and repolarization changes.  Prior ECG of 06/04/2013: EKG  suggests probable atrial flutter with a ventricular rate of 105 beats per minute. There also are frequent PVCs. QTc interval is 436 ms. They're nonspecific T changes.  LABS: BMP Latest Ref Rng & Units 05/17/2019 12/19/2018 11/09/2018  Glucose 70 - 99 mg/dL 181(H) 121(H) 121(H)  BUN 8 - 23 mg/dL 36(H) 34(H) 26(H)  Creatinine 0.61 - 1.24 mg/dL 2.18(H) 2.02(H) 1.95(H)  BUN/Creat Ratio 6 - 22 (calc) - 17 -  Sodium 135 - 145 mmol/L 142 141 141  Potassium 3.5 - 5.1 mmol/L 4.3 5.4(H) 4.2  Chloride 98 - 111 mmol/L 104 107 107  CO2 22 - 32 mmol/L '31 28 26  '$ Calcium 8.9 - 10.3 mg/dL 9.2 10.0 9.0   Hepatic Function Latest Ref Rng & Units 05/17/2019 12/19/2018 11/09/2018  Total Protein 6.5 - 8.1 g/dL 7.0 6.8 7.0  Albumin 3.5 - 5.0 g/dL 4.1 - 4.1  AST 15 - 41 U/L '18 13 16  '$ ALT 0 - 44 U/L '29 20 20  '$ Alk Phosphatase 38 - 126 U/L 47 - 37(L)  Total Bilirubin 0.3 - 1.2 mg/dL 0.8 0.8 1.0  Bilirubin, Direct 0.0 - 0.3 mg/dL - - -   CBC Latest Ref Rng & Units 05/17/2019 12/19/2018 11/09/2018  WBC 4.0 - 10.5 K/uL 4.6 5.3 4.3  Hemoglobin  13.0 - 17.0 g/dL 13.3 14.5 12.6(L)  Hematocrit 39.0 - 52.0 % 41.0 43.8 39.7  Platelets 150 - 400 K/uL 99(L) 116(L) 86(L)   Lab Results  Component Value Date   TSH 2.40 10/26/2015   Lipid Panel     Component Value Date/Time   CHOL 103 12/19/2018 0810   TRIG 167 (H) 12/19/2018 0810   HDL 36 (L) 12/19/2018 0810   CHOLHDL 2.9 12/19/2018 0810   VLDL  37 (H) 10/25/2016 0853   LDLCALC 42 12/19/2018 0810   LDLDIRECT 65.0 04/12/2013 0825     RADIOLOGY: No results found.  IMPRESSION:  1. PAF (paroxysmal atrial fibrillation) ([place])   2. OSA treated with BiPAP   3. Right bundle branch block   4. Essential hypertension   5. Hyperlipidemia LDL goal <70   6. Type 2 diabetes mellitus with chronic kidney disease, without long-term current use of insulin, unspecified CKD stage ([place])   7. CKD (chronic kidney disease) stage 4, GFR 15-29 ml/min ([facility])     ASSESSMENT AND PLAN: Martin Cordova is a 79 year old Caucasian male who has documented normal coronary arteries by cardiac catheterization in 2008. He has a history of hypertension, mixed hyperlipidemia, and has paroxysmal atrial fibrillation/flutter. He has a history of diabetes mellitus and after he had lost a significant amount of weight and with renal insufficiency he was taken off his diabetic medication.  However, due to recurrent weight gain he is back on therapy with Tradjenta followed by Dr. Buddy Duty.  He underwent successful catheter ablation for recurrent atrial flutter in November 2018.  He continues to be on long-term anticoagulation with Eliquis.  He denies recurrent arrhythmia.  Dr Caryl Comes had ordered an MRI of his heart in February 2019 which did not reveal any delayed gadolinium uptake arguing against scar, infiltration or hypertrophic cardiomyopathy.  There was no S.A.M. Presently, he is maintaining sinus rhythm and he has chronic right bundle branch block.  He continues to be on BiPAP auto SV therapy with 100% compliance.  He is sleeping  well.  There is no breakthrough snoring or residual daytime sleepiness.  He has stage IV chronic kidney disease with most recent creatinine 2.59.  Blood pressure on repeat by me was stable at 130/78 on carvedilol 25 mg twice a day, Cardizem CD 240 mg, and furosemide.  He was recently started on Calcitrol 25 mcg every other day.  He continues to be on atorvastatin 10 mg and most recent lipid panel in July 2020 showed an LDL of 42.  He is followed by Dr. Irene Limbo for his monoclonal gammopathy.  He will continue current therapy I will see him in 6 months for follow-up evaluation   Shelva Majestic, MD  10/10/2019  4:09 PM

## 2019-10-10 ENCOUNTER — Encounter: Payer: Self-pay | Admitting: Cardiovascular Disease

## 2019-10-18 ENCOUNTER — Other Ambulatory Visit: Payer: Self-pay

## 2019-10-18 MED ORDER — OMEGA-3-ACID ETHYL ESTERS 1 G PO CAPS: ORAL_CAPSULE | ORAL | 3 refills | Status: DC

## 2019-10-21 ENCOUNTER — Other Ambulatory Visit: Payer: Self-pay

## 2019-10-21 MED ORDER — APIXABAN 5 MG PO TABS: ORAL_TABLET | ORAL | 1 refills | Status: DC

## 2019-11-06 ENCOUNTER — Other Ambulatory Visit: Payer: Self-pay | Admitting: Cardiovascular Disease

## 2019-12-13 ENCOUNTER — Other Ambulatory Visit: Payer: Self-pay | Admitting: Cardiovascular Disease

## 2019-12-16 NOTE — Telephone Encounter (Signed)
Rx(s) sent to pharmacy electronically.  

## 2019-12-20 ENCOUNTER — Other Ambulatory Visit: Payer: Self-pay | Admitting: Cardiovascular Disease

## 2019-12-20 ENCOUNTER — Ambulatory Visit: Payer: Medicare PPO | Admitting: Family Medicine

## 2019-12-20 ENCOUNTER — Other Ambulatory Visit: Payer: Self-pay

## 2019-12-20 VITALS — BP 124/80 | HR 78 | Temp 97.4°F | Ht 71.0 in | Wt 246.0 lb

## 2019-12-20 DIAGNOSIS — E119 Type 2 diabetes mellitus without complications: Secondary | ICD-10-CM | POA: Diagnosis not present

## 2019-12-20 DIAGNOSIS — Z125 Encounter for screening for malignant neoplasm of prostate: Secondary | ICD-10-CM

## 2019-12-20 DIAGNOSIS — Z0000 Encounter for general adult medical examination without abnormal findings: Secondary | ICD-10-CM

## 2019-12-20 DIAGNOSIS — Z0001 Encounter for general adult medical examination with abnormal findings: Secondary | ICD-10-CM

## 2019-12-20 DIAGNOSIS — E785 Hyperlipidemia, unspecified: Secondary | ICD-10-CM | POA: Diagnosis not present

## 2019-12-20 DIAGNOSIS — I480 Paroxysmal atrial fibrillation: Secondary | ICD-10-CM

## 2019-12-20 DIAGNOSIS — D472 Monoclonal gammopathy: Secondary | ICD-10-CM

## 2019-12-20 NOTE — Telephone Encounter (Signed)
Rx(s) sent to pharmacy electronically.  

## 2019-12-20 NOTE — Progress Notes (Signed)
Subjective:    Patient ID: Martin Cordova, male    DOB: 1941/01/23, 79 y.o.   MRN: [id number]  HPI  Patient is a very pleasant 79 year old Caucasian male who is here today for complete physical exam.  Past medical history is significant for atrial fibrillation.  He also has type 2 diabetes mellitus.  This is managed by his endocrinologist who checks his hemoglobin A1c.  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  He has a history of chronic kidney disease and sees a nephrologist, Dr. Jimmy Footman.  He also has a history of skin cancer and is currently seeing a dermatologist for this.  He sees a urologist who checks his prostate however he would like [place] to check his PSA today.  Patient's colonoscopy was performed in 2019.  There were 5 polyps.  He is due again in 2022.  He is due for a PSA to screen for prostate cancer.  Pneumovax 23, Prevnar 13, and his shingles vaccines are all up-to-date.  He is due for a flu shot this fall.  He denies any problems with his balance.  He denies any falls.  He denies any depression.  He denies any memory loss or trouble with ADLs.  He does have chronic kidney disease.   He denies any polyuria, polydipsia, or blurry vision.  He denies any neuropathy in his feet.  He denies any chest pain shortness of breath or dyspnea on exertion.  He is in atrial fibrillation today but he is appropriately anticoagulated on Eliquis. Past Medical History:  Diagnosis Date  . Colon polyps   . Diabetes mellitus without complication ([place])   . Diverticulitis   . Diverticulosis   . GERD 12/07/2006  . HYPERGLYCEMIA 12/07/2006  . HYPERLIPIDEMIA 12/07/2006   mixed wth an atherogenic dyslipidemic pattern  . HYPERTENSION 12/07/2006  . Nephrolithiasis   . Nonalcoholic fatty liver disease   . Normal coronary arteries 04/06/2007   by cath EF 55%., last ECHO 10/13/10 EF>55% mild MR, last nuc 06/16/10 EF 57% low risk scan  . OBESITY 11/10/2009  . OSA treated with BiPAP 10/22/2007  . PAF  (paroxysmal atrial fibrillation) ([place]) 04/09/2013  . Palpitations    tachy  . Pulmonary HTN (HCC)    RV syst.  40-58mmHg- moderate by echo  . Renal insufficiency    Cr to 3 on ACE  . Sleep apnea   . SOB (shortness of breath)    with exertion  . THROMBOCYTOPENIA 12/07/2006  . TRANSAMINASES, SERUM, ELEVATED 12/07/2006  . 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 Claiborne Billings   Past Surgical History:  Procedure Laterality Date  . A-FLUTTER ABLATION N/A 04/19/2017   Procedure: A-FLUTTER ABLATION;  Surgeon: Deboraha Sprang, MD;  Location: [facility];  Service: Cardiovascular;  Laterality: N/A;  . CARDIAC CATHETERIZATION  04/06/2007   normal cors  . CARDIOVERSION N/A 08/05/2013   Procedure: CARDIOVERSION;  Surgeon: Troy Sine, MD;  Location: [facility];  Service: Cardiovascular;  Laterality: N/A;  . CARDIOVERSION N/A 12/26/2016   Procedure: CARDIOVERSION;  Surgeon: Troy Sine, MD;  Location: [facility];  Service: Cardiovascular;  Laterality: N/A;  . CHOLECYSTECTOMY    . CYSTOSCOPY/RETROGRADE/URETEROSCOPY  08/19/2011   Procedure: CYSTOSCOPY/RETROGRADE/URETEROSCOPY;  Surgeon: Hanley Ben, MD;  Location: [facility];  Service: Urology;  Laterality: Right;  JJ STENT PLACEMENT  . ROTATOR CUFF REPAIR Right    Current Outpatient Medications on File Prior to Visit  Medication Sig Dispense Refill  .  apixaban (ELIQUIS) 5 MG TABS tablet TAKE 1 TABLET BY MOUTH TWICE A DAY NEEDS APPT FOR REFILLS 180 tablet 1  . atorvastatin (LIPITOR) 10 MG tablet TAKE 1 TABLET BY MOUTH AT BEDTIME 90 tablet 3  . calcitRIOL (ROCALTROL) 0.25 MCG capsule     . carvedilol (COREG) 25 MG tablet TAKE 1 TABLET BY MOUTH 2 TIMES DAILY WITH A MEAL. 180 tablet 1  . diltiazem (CARDIZEM CD) 240 MG 24 hr capsule Take 1 capsule (240 mg total) by mouth at bedtime. 90 capsule 3  . famotidine (PEPCID) 10 MG tablet Take 1 tablet by mouth daily.    . furosemide (LASIX) 40 MG tablet Take 1 tablet by  mouth daily.    Marland Kitchen linagliptin (TRADJENTA) 5 MG TABS tablet Take 5 mg by mouth daily.    Marland Kitchen omega-3 acid ethyl esters (LOVAZA) 1 g capsule TAKE 2 CAPSULES BY MOUTH EVERY DAY NEEDS APPT FOR REFILLS 180 capsule 3  . potassium citrate (UROCIT-K) 10 MEQ (1080 MG) SR tablet Take 10 mEq by mouth 2 (two) times daily.     . sildenafil (VIAGRA) 100 MG tablet Take 0.5-1 tablets (50-100 mg total) by mouth daily as needed for erectile dysfunction. 5 tablet 11   Current Facility-Administered Medications on File Prior to Visit  Medication Dose Route Frequency Provider Last Rate Last Admin  . 0.9 %  sodium chloride infusion  500 mL Intravenous Once Armbruster, Carlota Raspberry, MD       Allergies  Allergen Reactions  . Lisinopril Swelling and Other (See Comments)    Renal insufficiency also  . Ibuprofen Other (See Comments)    Per the patient, he has "kidney and liver issues" and is NOT suppose to take this   Social History   Socioeconomic History  . Marital status: Married    Spouse name: Not on file  . Number of children: 4  . Years of education: Not on file  . Highest education level: Not on file  Occupational History  . Occupation: retired Pharmacist, hospital  Tobacco Use  . Smoking status: Former Smoker    Years: 10.00    Types: Cigarettes    Quit date: 08/04/1980    Years since quitting: 39.4  . Smokeless tobacco: Former Systems developer    Types: Chew    Quit date: 08/08/2004  Vaping Use  . Vaping Use: Never used  Substance and Sexual Activity  . Alcohol use: Yes    Alcohol/week: 0.0 standard drinks    Comment: 1 beer 2-3 times per month  . Drug use: No  . Sexual activity: Yes  Other Topics Concern  . Not on file  Social History Narrative  . Not on file   Social Determinants of Health   Financial Resource Strain:   . Difficulty of Paying Living Expenses:   Food Insecurity:   . Worried About Charity fundraiser in the Last Year:   . Arboriculturist in the Last Year:   Transportation Needs:   . Lexicographer (Medical):   Marland Kitchen Lack of Transportation (Non-Medical):   Physical Activity:   . Days of Exercise per Week:   . Minutes of Exercise per Session:   Stress:   . Feeling of Stress :   Social Connections:   . Frequency of Communication with Friends and Family:   . Frequency of Social Gatherings with Friends and Family:   . Attends Religious Services:   . Active Member of Clubs or Organizations:   . Attends  Club or Organization Meetings:   Marland Kitchen Marital Status:   Intimate Partner Violence:   . Fear of Current or Ex-Partner:   . Emotionally Abused:   Marland Kitchen Physically Abused:   . Sexually Abused:    Family History  Problem Relation Age of Onset  . Diabetes Mother   . Heart failure Mother   . Stroke Mother   . Kidney failure Father   . Diabetes Father   . Cancer Paternal Uncle   . Colon cancer Neg Hx   . Esophageal cancer Neg Hx   . Stomach cancer Neg Hx   . Rectal cancer Neg Hx      Review of Systems  All other systems reviewed and are negative.      Objective:   Physical Exam Vitals reviewed.  Constitutional:      General: He is not in acute distress.    Appearance: He is well-developed. He is not diaphoretic.  HENT:     Head: Normocephalic and atraumatic.     Right Ear: External ear normal.     Left Ear: External ear normal.     Nose: Nose normal.     Mouth/Throat:     Pharynx: No oropharyngeal exudate.  Eyes:     General: No scleral icterus.       Right eye: No discharge.        Left eye: No discharge.     Conjunctiva/sclera: Conjunctivae normal.     Pupils: Pupils are equal, round, and reactive to light.  Neck:     Thyroid: No thyromegaly.     Vascular: No JVD.     Trachea: No tracheal deviation.  Cardiovascular:     Rate and Rhythm: Normal rate. Rhythm irregular.     Heart sounds: Normal heart sounds. No murmur heard.  No friction rub. No gallop.   Pulmonary:     Effort: Pulmonary effort is normal. No respiratory distress.     Breath sounds:  Normal breath sounds. No stridor. No wheezing or rales.  Chest:     Chest wall: No tenderness.  Abdominal:     General: Bowel sounds are normal. There is no distension.     Palpations: Abdomen is soft. There is no mass.     Tenderness: There is no abdominal tenderness. There is no guarding or rebound.  Musculoskeletal:        General: No tenderness. Normal range of motion.     Cervical back: Normal range of motion and neck supple.  Lymphadenopathy:     Cervical: No cervical adenopathy.  Skin:    General: Skin is warm.     Coloration: Skin is not pale.     Findings: No erythema or rash.  Neurological:     Mental Status: He is alert and oriented to person, place, and time.     Cranial Nerves: No cranial nerve deficit.     Motor: No abnormal muscle tone.     Coordination: Coordination normal.     Deep Tendon Reflexes: Reflexes are normal and symmetric.  Psychiatric:        Behavior: Behavior normal.        Thought Content: Thought content normal.        Judgment: Judgment normal.           Assessment & Plan:  General medical exam - Plan: CBC with Differential/Platelet, COMPLETE METABOLIC PANEL WITH GFR, Lipid panel, PSA  Controlled type 2 diabetes mellitus without complication, without long-term current use of insulin ([place]) -  Plan: CBC with Differential/Platelet, COMPLETE METABOLIC PANEL WITH GFR, Lipid panel  Hyperlipidemia LDL goal <70  Prostate cancer screening - Plan: PSA  PAF (paroxysmal atrial fibrillation) (HCC)  MGUS (monoclonal gammopathy of unknown significance)  Patient's physical exam today is completely normal aside from some diminished pulses in his right foot.  However this is asymptomatic and at the present time we have decided not to perform ABIs.  He will monitor his feet closely and let me know if he develops any symptoms of claudication.  Check CBC, CMP, fasting lipid panel.  Goal LDL cholesterol is less than 70.  Screen for prostate cancer with a PSA.   Hemoglobin A1c is monitored by his endocrinologist.  Patient is appropriately anticoagulated for his atrial fibrillation.  His rate is appropriately controlled although he is out of rhythm today.  Colonoscopy is due next year.  Immunizations are up-to-date.  Patient denies any falls, depression, or memory loss.

## 2019-12-21 LAB — CBC WITH DIFFERENTIAL/PLATELET
Absolute Monocytes: 604 cells/uL (ref 200–950)
Basophils Absolute: 29 cells/uL (ref 0–200)
Basophils Relative: 0.5 %
Eosinophils Absolute: 217 cells/uL (ref 15–500)
Eosinophils Relative: 3.8 %
HCT: 44.3 % (ref 38.5–50.0)
Hemoglobin: 14.9 g/dL (ref 13.2–17.1)
Lymphs Abs: 935 cells/uL (ref 850–3900)
MCH: 31 pg (ref 27.0–33.0)
MCHC: 33.6 g/dL (ref 32.0–36.0)
MCV: 92.1 fL (ref 80.0–100.0)
MPV: 12.2 fL (ref 7.5–12.5)
Monocytes Relative: 10.6 %
Neutro Abs: 3916 cells/uL (ref 1500–7800)
Neutrophils Relative %: 68.7 %
Platelets: 119 10*3/uL — ABNORMAL LOW (ref 140–400)
RBC: 4.81 10*6/uL (ref 4.20–5.80)
RDW: 13 % (ref 11.0–15.0)
Total Lymphocyte: 16.4 %
WBC: 5.7 10*3/uL (ref 3.8–10.8)

## 2019-12-21 LAB — COMPLETE METABOLIC PANEL WITH GFR
AG Ratio: 1.6 (calc) (ref 1.0–2.5)
ALT: 26 U/L (ref 9–46)
AST: 18 U/L (ref 10–35)
Albumin: 4.2 g/dL (ref 3.6–5.1)
Alkaline phosphatase (APISO): 37 U/L (ref 35–144)
BUN/Creatinine Ratio: 15 (calc) (ref 6–22)
BUN: 34 mg/dL — ABNORMAL HIGH (ref 7–25)
CO2: 24 mmol/L (ref 20–32)
Calcium: 9.6 mg/dL (ref 8.6–10.3)
Chloride: 105 mmol/L (ref 98–110)
Creat: 2.24 mg/dL — ABNORMAL HIGH (ref 0.70–1.18)
GFR, Est African American: 32 mL/min/{1.73_m2} — ABNORMAL LOW (ref >=60)
GFR, Est Non African American: 27 mL/min/{1.73_m2} — ABNORMAL LOW (ref >=60)
Globulin: 2.7 g/dL (calc) (ref 1.9–3.7)
Glucose, Bld: 125 mg/dL — ABNORMAL HIGH (ref 65–99)
Potassium: 4.2 mmol/L (ref 3.5–5.3)
Sodium: 142 mmol/L (ref 135–146)
Total Bilirubin: 0.7 mg/dL (ref 0.2–1.2)
Total Protein: 6.9 g/dL (ref 6.1–8.1)

## 2019-12-21 LAB — PSA: PSA: 1.1 ng/mL (ref <=4.0)

## 2019-12-21 LAB — LIPID PANEL
Cholesterol: 103 mg/dL (ref <200)
HDL: 33 mg/dL — ABNORMAL LOW (ref >=40)
LDL Cholesterol (Calc): 47 mg/dL (calc)
Non-HDL Cholesterol (Calc): 70 mg/dL (calc) (ref <130)
Total CHOL/HDL Ratio: 3.1 (calc) (ref <5.0)
Triglycerides: 152 mg/dL — ABNORMAL HIGH (ref <150)

## 2020-03-31 ENCOUNTER — Encounter: Payer: Self-pay | Admitting: Family Medicine

## 2020-05-03 ENCOUNTER — Other Ambulatory Visit: Payer: Self-pay | Admitting: Cardiovascular Disease

## 2020-05-04 ENCOUNTER — Ambulatory Visit (INDEPENDENT_AMBULATORY_CARE_PROVIDER_SITE_OTHER): Payer: Medicare PPO | Admitting: Cardiovascular Disease

## 2020-05-04 ENCOUNTER — Encounter: Payer: Self-pay | Admitting: Cardiovascular Disease

## 2020-05-04 ENCOUNTER — Other Ambulatory Visit: Payer: Self-pay

## 2020-05-04 VITALS — BP 130/72 | HR 56 | Ht 71.0 in | Wt 245.4 lb

## 2020-05-04 DIAGNOSIS — Z7901 Long term (current) use of anticoagulants: Secondary | ICD-10-CM

## 2020-05-04 DIAGNOSIS — G4733 Obstructive sleep apnea (adult) (pediatric): Secondary | ICD-10-CM | POA: Diagnosis not present

## 2020-05-04 DIAGNOSIS — I480 Paroxysmal atrial fibrillation: Secondary | ICD-10-CM

## 2020-05-04 DIAGNOSIS — I4510 Unspecified right bundle-branch block: Secondary | ICD-10-CM

## 2020-05-04 DIAGNOSIS — N184 Chronic kidney disease, stage 4 (severe): Secondary | ICD-10-CM

## 2020-05-04 DIAGNOSIS — I10 Essential (primary) hypertension: Secondary | ICD-10-CM

## 2020-05-04 DIAGNOSIS — E785 Hyperlipidemia, unspecified: Secondary | ICD-10-CM | POA: Diagnosis not present

## 2020-05-04 DIAGNOSIS — E1122 Type 2 diabetes mellitus with diabetic chronic kidney disease: Secondary | ICD-10-CM

## 2020-05-04 NOTE — Progress Notes (Signed)
Patient ID: Martin Cordova, male   DOB: 1941/05/18, 79 y.o.   MRN: [id number]      HPI: Abed Schar Cressler is a 79 y.o. male is who presents to the office today for a 6 month follow-up cardiology/sleep clinic evaluation.  Mr. 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 In the past, he developed an obstructive uropathy attributed to kidney stones; creatinine had risen up to 3 and ultimately improved and stabilized at approximately 1.7. Mr. Dimmick uses his BiPAP daily and does note good sleep.  In the past he had issues with mild peripheral edema, which ultimately improved.  He had been previously taken off his lisinopril when his creatinine had risen in the setting of his obstructive uropathy.  In 2015 he developed recurrent episodes of palpitations and was found to have recurrent paroxysmal atrial fibrillation/flutter.  His medications were adjusted and  he was started on antiarrhythmic therapy with Rythmol to take in addition to his increasing doses of carvedilol.  He was started on Eliquis for anticoagulation.  He underwent successful cardioversion on 08/05/2013.  Since that time, he is unaware of any recurrent atrial fibrillation.  He has noticed more energy. He had been maintained on 50 mg twice a day of carvedilol in addition to Rythmol 225 mg every 8 hours, but due to slow pulse rates, these doses have ultimately been reduced.  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 He has been on CPAP therapy initially for approximately 8 years and has been on BiPAP auto SV for over 4 years. He admits to using his BiPAP with 100% compliance.  He obtained a new BiPAP machine in June.  He feels that his machine is  significantly improved from his prior one. When I last saw him I reviewed his  download of his new BiPAP auto SV machine indicates that 90% of the time is EPAP pressure was 17 and 90% of the time his pressure support was 5.8, giving an IPAP of 21.8 cm.  He is using it 100% of the time and is averaging 5 hours and 49 minutes on his most recent download from 12/07/2013 through 01/05/2014.  His average AHI was 7.5.  His device setting maximum BiPAP pressure is 25 cm in maximum EPAP pressure 21 cm.  His average central event index is 1.3, and average obstructive apneic index 0.2, with an average copy index of 6.0.  Average breath.  Rate is 18 breaths per minute with a minute ventilation of 12.1.  Average total body may 670 mL.  When I saw him on 10/21/15 he was unaware of any arrhythmia.  He was on carvedilol 12.5 mg twice a day, Rythmol 225 mg twice a day, hydralazine 25 mg twice a day and eliquis  5 mg twice a day.  He also has been taking omeprazole for GERD and atorvastatin 10 mg for hyperlipidemia. He was found to have recurrent atrial flutter with variable block.  At that time, I recommended that he increase his Rythmol and take 225 mg every 8 hours and further increased his carvedilol to 18.75 mg twice a day.  Laboratory had revealed a magnesium of 2.0.  He has stage III chronic kidney disease  and his BUN was 26, creatinine 1.71 with a GFR estimate of 39.  Potassium was 4.8.  TSH was normal at 2.4.  Insomnia.  One week later in follow-up he had reverted back to sinus rhythm..  When seen in September 2017, he was maintaining sinus rhythm.  Since then, he admits to weight gain.  In early April, he began to notice some increasing shortness of breath and elevated heart rate and fatigue.  He was seen by Hao Meng on 09/07/2016 and was found to be back in atrial flutter with a ventricular rate at 90.  He has been on eloquence 5 g twice a day for anticoagulation and was on carvedilol 18.75 twice a day.  Carvedilol  dose was increased to 25 mg twice a day.  He was also told at that time to try increasing his Propofenone to 300 mg 3 times a day.  He took this for several days but did not tolerate the increased dose and reduced it back to its previous dose of 225 mg every 8 hours.  When I saw him on 10/20/2016  I increased carvedilol to 37.5 mg twice a day.  He has continued to use his BiPAP therapy for sleep apnea.    When I saw him in June 2018 he was still in atrial flutter with 3:1 block.  I discontinued hydralazine and started him on Cardizem CD 180 mg, both for blood pressure and potential antiarrhythmic therapy. He also was having ankle edema and I started him on low-dose hydrochlorothiazide.  His peripheral edema significantly improved.  He has felt better.  He denied episodes of chest tightness.  He underwent successful outpatient DC cardioversion by me on 12/26/2016.  He successfully cardioverted at 120 joules.  When he returned for a follow-up evaluation on 01/12/2017 and saw  Hao Meng, PAC .  He was back in atrial flutter.  At that time his propafenone was discontinued.  Over the past several months, Mr. Gosling has felt well.  At times there is some trace swelling in his ankles.  He continues to use his BiPAP with 100% compliance.  He was hospitalized on September 19 with an episode of diverticulitis and was discharged on 02/24/2017.   When I saw him in October 2018, he was back in atrial flutter.  At that time, I recommended consideration for atrial flutter ablation.  He was evaluated by Dr. Klein and on 04/19/2017 underwent successful atrial flutter ablation across the caval tricuspid isthmus, which interrupted bidirectional conduction.  Initial plan was for him to continue anticoagulation for 4 weeks, but unfortunately he developed irregular tachycardia palpitations about 48 hours after the procedure.  He's been found to have paroxysmal atrial fibrillation.  He as result is on chronic anticoagulation  therapy.  In November 2018 an echo Doppler study showed an EF of 40-45%.  He has been on carvedilol 25 mg twice a day and takes an extra carvedilol as needed.  He has had recurrent short burst of atrial fibrillation, but had an episode for almost an entire day.  He received a new BiPAP machine after his previous machine completely malfunction.  He admits to 100% compliance.  [employer] [facility] [employer] is his DME company.  He has diabetes mellitus and was started back on Tradjenta for hemoglobin A1c of 7.    He has a [employer] BiPAP auto SV unit. I obtained a download from September 02, 2017 through October 01, 2017.  He is compliant.  His average CPAP pressure   was 16.8 and average pressure support pressure was 4.1.  90% of the time device EPAP was 17 cm.  AHI, however, was still mildly increased at 8.9.  He is unaware of breakthrough snoring.  He denies restless legs.  He denies chest pain.  He has noticed some mild episodes of dizziness in the morning.     I recommended some changes to his BiPAP therapy and change his ramp time to 5 minutes, increased but pressure support to 5.5 increased his EPAP to 9 cm water pressure.  A download was obtained from June 12 through December 14, 2017.  He is 100% compliant and averaging his hours and 24 minutes of CPAP use per night.  His average AHI is still slightly elevated 8.2.  90% of time device CPAP was 17 cm water pressure.  Though his pressure support range can from 0 to 5.5 cm, he apparently has only been needing a pressure support of 2.4 cm of water.  He feels well.  He has a F&P medium cushion full facemask but he is an oral breather and when he opens his mouth the facemask is actually too small which may be contributing to some of his increased AHI.  He presents to sleep clinic for further evaluation. He had been evaluated by Dr. Jimmy Footman for his renal issues.  He denies any awareness of recurrent atrial flutter.  He has seen Dr. Dennard Schaumann here he also has been  diagnosed with monoclonal gammopathy of undetermined significance and is monitored by Dr. Irene Limbo.   He had undergone a cardiac MRI which was ordered by Dr. Caryl Comes last year in February 2019 which showed normal LV size and function with an EF of 54%.  There was no delayed gadolinium uptake on inversion recovery sequences.  He had a trileaflet aortic valve with mild AR, mild MR, moderate RV enlargement, moderate biatrial enlargement and there was moderate LVH with the mid and basal septum measuring 14 mm compared to the posterior wall at 9 mm.  I last saw him in February 2020 at which time he denied any recurrent chest pain or abnormal heart rhythm.  He was using his BiPAP auto SV with 100% compliance.  A download was obtained in the office from January 26 through July 30, 2018.  He is 100% compliant.  He requires high pressures and his average EPAP pressure was 17 which also was his 90% pressure.  He is set up to a potential maximum IPAP pressure of 30.  His pressure support ranges from 0-5.5 with an average pressure support of 2.4 cm.  He is now using a large mask which is much more comfortable and is he is doing better.  On his most recent download, his AHI was still mildly increased at 7.1/h.  He has undergone follow-up hematologic evaluation with Dr. Velvet Bathe for his monoclonal gammopathy.    I last saw him on April 05, 2019.  Since his prior evaluation he felt that he may have had 3 brief short-lived episodes of PAF which resolved spontaneously.  He denied any associated chest pain. He underwent Mohs surgery for a skin cancer on his chin.  He was unable to use BiPAP for several days since this is where his mask would rest.  I obtained a download from July 1 through April 04, 2019.  His minimum EPAP pressure was 17 with a maximum IPAP pressure of 30 and pressure support of 5.5 cm.  Apparently his ramp start pressure is 9.5 cm.  Average AHI  is 6.9.  90% of the time his device EPAP pressure was 17.  Average  breath rate was 16.1 breaths/min.   Echo Doppler study on January 25, 2019 showed an EF of 60 to 65% with mild LVH.  Normal diastolic parameters.  There was moderate aortic sclerosis without stenosis and mild AR.  I last saw him on Oct 08, 2019 at which time he felt well.  He is followed by nephrology with stage IV chronic kidney disease with recent creatinine in February 2021 at 2.59.  He was unaware of any recurrent atrial fibrillation or flutter.  He continues to use BiPAP auto SV therapy with 100% compliance.  He is followed by Dr. Buddy Duty for his diabetes mellitus.   Since I last saw him, he has had remained stable.  He is now followed by Dr. Baird Cancer with the imminent retirement of Dr. Baird Cancer.  Laboratory from April 03, 2020 did show improvement in his creatinine which typically range between 2 and 2.5 and was 1.99.  He denies any significant shortness of breath.  At times he has noticed.  Short brief episodes of possible PAF which self resolved.  His medical regimen has included carvedilol 25 mg twice a day, diltiazem 240 mg daily, furosemide 40 mg for blood pressure and heart rate control.  He is diabetic on Tradjenta.  He has been on Eliquis without bleeding for anticoagulation and continues to be on low-dose atorvastatin and lovaza for hyperlipidemia.  He presents for evaluation.   Past Medical History  Diagnosis Date  . GERD 12/07/2006  . HYPERGLYCEMIA 12/07/2006  . HYPERLIPIDEMIA 12/07/2006    mixed wth an atherogenic dyslipidemic pattern  . HYPERTENSION 12/07/2006  . OBESITY 11/10/2009  . THROMBOCYTOPENIA 12/07/2006  . TRANSAMINASES, SERUM, ELEVATED 12/07/2006  . SLEEP APNEA 10/22/2007    uses bipap  . 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 Claiborne Billings  . Diabetes mellitus without complication   . Renal insufficiency     Cr to 3 on ACE  . Nephrolithiasis   . Normal coronary arteries 04/06/2007    by cath EF 55%., last ECHO 10/13/10 EF>55% mild MR, last nuc 06/16/10 EF 57% low risk scan    . Pulmonary HTN     RV syst.  40-69mmHg- moderate by echo  . Palpitations     tachy  . PAF (paroxysmal atrial fibrillation) 04/09/2013    Past Surgical History  Procedure Laterality Date  . Cholecystectomy    . Rotator cuff repair    . Cystoscopy/retrograde/ureteroscopy  08/19/2011    Procedure: CYSTOSCOPY/RETROGRADE/URETEROSCOPY;  Surgeon: Hanley Ben, MD;  Location: [facility];  Service: Urology;  Laterality: Right;  JJ STENT PLACEMENT   . Cardiac catheterization  04/06/2007    normal cors    Allergies  Allergen Reactions  . Lisinopril     Renal insufficiency    Current Outpatient Medications:  .  atorvastatin (LIPITOR) 10 MG tablet, Take 1 tablet (10 mg total) by mouth at bedtime. TAKE 1 TABLET BY MOUTH EVERYDAY AT BEDTIME, Disp: 90 tablet, Rfl: 3 .  calcitRIOL (ROCALTROL) 0.25 MCG capsule, , Disp: , Rfl:  .  diltiazem (CARDIZEM CD) 240 MG 24 hr capsule, Take 1 capsule (240 mg total) by mouth at bedtime., Disp: 90 capsule, Rfl: 3 .  famotidine (PEPCID) 10 MG tablet, Take 1 tablet by mouth daily., Disp: , Rfl:  .  furosemide (LASIX) 40 MG tablet, Take 1 tablet by mouth daily., Disp: , Rfl:  .  linagliptin (TRADJENTA)  5 MG TABS tablet, Take 5 mg by mouth daily., Disp: , Rfl:  .  omega-3 acid ethyl esters (LOVAZA) 1 g capsule, TAKE 2 CAPSULES BY MOUTH EVERY DAY NEEDS APPT FOR REFILLS, Disp: 180 capsule, Rfl: 3 .  potassium citrate (UROCIT-K) 10 MEQ (1080 MG) SR tablet, Take 10 mEq by mouth 2 (two) times daily. , Disp: , Rfl:  .  sildenafil (VIAGRA) 100 MG tablet, Take 0.5-1 tablets (50-100 mg total) by mouth daily as needed for erectile dysfunction., Disp: 5 tablet, Rfl: 11 .  carvedilol (COREG) 25 MG tablet, TAKE 1 TABLET BY MOUTH TWICE A DAY WITH A MEAL, Disp: 180 tablet, Rfl: 1 .  ELIQUIS 5 MG TABS tablet, TAKE 1 TABLET BY MOUTH TWICE A DAY NEEDS APPT FOR REFILLS, Disp: 180 tablet, Rfl: 1  Current Facility-Administered Medications:  .  0.9 %  sodium  chloride infusion, 500 mL, Intravenous, Once, Armbruster, Carlota Raspberry, MD  Socially he is married has 4 children and 5 grandchildren. He is a distant relative to the "Dant brothers." He does try to walk and exercise. There is no tobacco use. He does drink occasional alcohol.   ROS General: Negative; No fevers, chills, or night sweats;  HEENT: Negative; No changes in vision or hearing, sinus congestion, difficulty swallowing Pulmonary: Negative; No cough, wheezing, shortness of breath, hemoptysis Cardiovascular: See history of present illness No recent peripheral edema GI: Status post recent episode of diverticulitis requiring 2 day hospitalization GU:  No dysuria, hematuria, or difficulty voiding; some difficulty with erectile function Musculoskeletal: Negative; no myalgias, joint pain, or weakness Hematologic/Oncology: Positive M spike with monoclonal gammopathy of undetermined significance; history of thrombocytopenia Endocrine: Negative; no heat/cold intolerance; no diabetes Neuro: Negative; no changes in balance, headaches Skin: Negative; No rashes or skin lesions Psychiatric: Negative; No behavioral problems, depression Sleep: He is using his BiPAP Auto SV therapy with 100% compliance.  No snoring, daytime sleepiness, hypersomnolence, bruxism, restless legs, hypnogognic hallucinations, no cataplexy Other comprehensive 14 point system review is negative.  PE BP 130/72   Pulse (!) 56   Ht $R'5\' 11"'kY$  (1.803 m)   Wt 245 lb 6.4 oz (111.3 kg)   BMI 34.23 kg/m    Repeat blood pressure by me was 120/74  Wt Readings from Last 3 Encounters:  05/04/20 245 lb 6.4 oz (111.3 kg)  12/20/19 246 lb (111.6 kg)  10/08/19 222 lb 9.6 oz (101 kg)   .General: Alert, oriented, no distress.  Skin: normal turgor, no rashes, warm and dry HEENT: Normocephalic, atraumatic. Pupils equal round and reactive to light; sclera anicteric; extraocular muscles intact; normal Nose without nasal septal  hypertrophy Mouth/Parynx benign; Mallinpatti scale 3 Neck: No JVD, no carotid bruits; normal carotid upstroke Lungs: clear to ausculatation and percussion; no wheezing or rales Chest wall: without tenderness to palpitation Heart: PMI not displaced, RRR, s1 s2 normal, 1/6 systolic murmur, no diastolic murmur, no rubs, gallops, thrills, or heaves Abdomen: soft, nontender; no hepatosplenomehaly, BS+; abdominal aorta nontender and not dilated by palpation. Back: no CVA tenderness Pulses 2+ Musculoskeletal: full range of motion, normal strength, no joint deformities Extremities: no clubbing cyanosis or edema, Homan's sign negative  Neurologic: grossly nonfocal; Cranial nerves grossly wnl Psychologic: Normal mood and affect   ECG (independently read by me): Sinus bradycardia at 56, PVC, RBBB  May 4, 2021ECG (independently read by me): Normal sinus rhythm at 63 bpm with premature complex.  Right bundle branch block with repolarization changes.  Normal intervals.  April 05, 2019 ECG (  independently read by me): Sinus Bradycardia ay 55: RBBB with repolarization.  February 25,2020 ECG (independently read by me): Sinus bradycardia at 56 bpm.  Right bundle branch block with repolarization changes.  October 03, 2017 ECG (independently read by me):  Sinus bradycardia at 50 bpm.  Right bundle branch block with repolarization changes. QTc interval 461 ms.  January 2019 ECG (independently read by me): Sinus bradycardia 56 bpm.  Possible left atrial enlargement.  Right bundle branch block with repolarization changes.  PR interval 186 ms; QTc interval 436 ms  October 2018 ECG (independently read by me): Atrial flutter with variable block at 96 bpm.  Right bundle-branch block with repolarization changes.  QTc interval 437 ms  July 2018 ECG (independently read by me): Probable atrial flutter with a ventricular rate at 82 bpm.  Right bundle-branch block, left anterior hemiblock.  11/08/2016 ECG  (independently read by me): Probable atrial flutter 3:1 block , ventricular rate at 85;  right bundle branch block with repolarization changes.  QTc interval 528 ms.   May 2018 ECG (independently read by me): atrial flutter with variable block with ventricular rate at 89 bpm.  Right bundle-branch block, left anterior hemiblock.  September 2017 ECG (independently read by me): Normal sinus rhythm at 63 bpm.  First-degree AV block with a PR interval of 208 ms.  Right bundle branch block with repolarization changes.  QTc interval 466 ms.  10/28/15 ECG (independently read by me): Sinus bradycardia at 54 bpm.  First degree block with PR interval 222 ms.  Right bundle-branch block.  10/21/2015 ECG (independently read by me): Atrial flutter with variable block.  Right bundle-branch block with repolarization changes.  October 2016 ECG (independently read by me): sinus rhythm with first-degree AV block with a PR interval at 216 ms.  Ventricular rate 86 bpm with occasional PVCs.    May 2016 ECG (independently read by me): Normal sinus rhythm at 60 bpm.  Right bundle-branch block with repolarization changes.  November 2015 ECG (independently read by me): Normal sinus rhythm at 61.  Nonspecific T abnormality.  QTc interval 394 ms.  11/18/2013 ECG (independently read by me): Sinus bradycardia 58 beats per minute.  PR interval 198 ms; QTc interval 371 ms.  Nonspecific ST changes.  07/29/2013 ECG  (independently read by me): Atrial flutter with 2:1 block with a ventricular rate of 87 beats per minute. Atrial rate is approximately 360 ms. 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 fib flutter now with right bundle branch block and repolarization changes.  Prior ECG of 06/04/2013: EKG  suggests probable atrial flutter with a ventricular rate of 105 beats per minute. There also are frequent PVCs. QTc interval is 436 ms. They're nonspecific T  changes.  LABS: BMP Latest Ref Rng & Units 12/20/2019 05/17/2019 12/19/2018  Glucose 65 - 99 mg/dL 125(H) 181(H) 121(H)  BUN 7 - 25 mg/dL 34(H) 36(H) 34(H)  Creatinine 0.70 - 1.18 mg/dL 2.24(H) 2.18(H) 2.02(H)  BUN/Creat Ratio 6 - 22 (calc) 15 - 17  Sodium 135 - 146 mmol/L 142 142 141  Potassium 3.5 - 5.3 mmol/L 4.2 4.3 5.4(H)  Chloride 98 - 110 mmol/L 105 104 107  CO2 20 - 32 mmol/L $RemoveB'24 31 28  'WEMUmwwV$ Calcium 8.6 - 10.3 mg/dL 9.6 9.2 10.0   Hepatic Function Latest Ref Rng & Units 12/20/2019 05/17/2019 12/19/2018  Total Protein 6.1 - 8.1 g/dL 6.9 7.0 6.8  Albumin 3.5 - 5.0 g/dL - 4.1 -  AST 10 - 35 U/L $Remo'18 18 13  'CpHGT$ ALT 9 - 46 U/L $Remo'26 29 20  'zpoKc$ Alk Phosphatase 38 - 126 U/L - 47 -  Total Bilirubin 0.2 - 1.2 mg/dL 0.7 0.8 0.8  Bilirubin, Direct 0.0 - 0.3 mg/dL - - -   CBC Latest Ref Rng & Units 12/20/2019 05/17/2019 12/19/2018  WBC 3.8 - 10.8 Thousand/uL 5.7 4.6 5.3  Hemoglobin 13.2 - 17.1 g/dL 14.9 13.3 14.5  Hematocrit 38 - 50 % 44.3 41.0 43.8  Platelets 140 - 400 Thousand/uL 119(L) 99(L) 116(L)   Lab Results  Component Value Date   TSH 2.40 10/26/2015   Lipid Panel     Component Value Date/Time   CHOL 103 12/20/2019 0856   TRIG 152 (H) 12/20/2019 0856   HDL 33 (L) 12/20/2019 0856   CHOLHDL 3.1 12/20/2019 0856   VLDL 37 (H) 10/25/2016 0853   LDLCALC 47 12/20/2019 0856   LDLDIRECT 65.0 04/12/2013 0825     RADIOLOGY: No results found.  IMPRESSION:  1. PAF (paroxysmal atrial fibrillation) ([place])   2. OSA treated with BiPAP   3. Right bundle branch block   4. Type 2 diabetes mellitus with chronic kidney disease, without long-term current use of insulin, unspecified CKD stage ([place])   5. Essential hypertension   6. Hyperlipidemia LDL goal <70   7. Anticoagulation adequate   8. CKD (chronic kidney disease) stage 4, GFR 15-29 ml/min ([facility])     ASSESSMENT AND PLAN: Mr. Gange is a 79 year old Caucasian male who has documented normal coronary arteries by cardiac catheterization in 2008.  He has a history of hypertension, mixed hyperlipidemia, and has paroxysmal atrial fibrillation/flutter. He has a history of diabetes mellitus and after he had lost a significant amount of weight and with renal insufficiency he was taken off his diabetic medication.  However, due to recurrent weight gain he is back on therapy with Tradjenta followed by Dr. Buddy Duty.  He underwent successful catheter ablation for recurrent atrial flutter in November 2018.  He continues to be on long-term anticoagulation with Eliquis.  He denies recurrent arrhythmia.  Dr Caryl Comes had ordered an MRI of his heart in February 2019 which did not reveal any delayed gadolinium uptake arguing against scar, infiltration or hypertrophic cardiomyopathy.  There was no SAM.  Presently, he feels well and has been without chest pain or shortness of breath.  At times if he overexerts himself he may sense an episode of heart rate irregularity which he believes may be bursts of atrial fibrillation.  Typically these resolve spontaneously.  He has been on carvedilol 25 mg twice a day in addition to diltiazem 240 mg to control heart rate.  His ECG today shows sinus bradycardia at 56 bpm with right bundle branch block which is old.  I have suggested that if he continues to notice increasing frequency of possible PAF episodes that he slightly adjust his carvedilol and take 37.5 mg in the morning but continue to take 25 mg dose at night.  I also had a long discussion with him regarding his BiPAP auto SV machine.  I discussed the issues of the [employer] machine with reference to the very small risk of Styrofoam breakdown contributed by possible ozone related cleaning cleansing agents.  He did contact [employer] when notified and registered.  Presently I have recommended he try to obtain the filter from his DME company [employer] and if unable to obtain this filter from them to contact [facility] and he can purchase this filter  to be placed between his unit  and posing to reduce the rare potential risk of particular breakdown.  He continues to use the BiPAP machine and has a high pressure requirement with an EPAP minimum of 17 and he is set at a pressure support range of 0 to 5.5 cm with an average pressure support of 1.7 cm of water.  He is sleeping well.  He has a follow-up appointment with Dr. Velvet Bathe for his monoclonal gammopathy and mild thrombocytopenia.  He sees nephrology every 6 months.  I will see him in 6 months for follow-up neurologic evaluation or sooner as needed.    Shelva Majestic, MD  05/04/2020  6:23 PM

## 2020-05-04 NOTE — Patient Instructions (Signed)
Medication Instructions:  For increased episodes of paroxysmal atrial fibrillation: Increase your carvedilol to 1.5 tablets (37.5 mg) in the a.m.  *If you need a refill on your cardiac medications before your next appointment, please call your pharmacy*   Lab Work: None ordered If you have labs (blood work) drawn today and your tests are completely normal, you will receive your results only by: Marland Kitchen MyChart Message (if you have MyChart) OR . A paper copy in the mail If you have any lab test that is abnormal or we need to change your treatment, we will call you to review the results.   Testing/Procedures: None ordered   Follow-Up: At [facility], you and your health needs are our priority.  As part of our continuing mission to provide you with exceptional heart care, we have created designated Provider Care Teams.  These Care Teams include your primary Cardiologist (physician) and Advanced Practice Providers (APPs -  Physician Assistants and Nurse Practitioners) who all work together to provide you with the care you need, when you need it.  We recommend signing up for the patient portal called "MyChart".  Sign up information is provided on this After Visit Summary.  MyChart is used to connect with patients for Virtual Visits (Telemedicine).  Patients are able to view lab/test results, encounter notes, upcoming appointments, etc.  Non-urgent messages can be sent to your provider as well.   To learn more about what you can do with MyChart, go to [email or web address].    Your next appointment:   6 month(s)  The format for your next appointment:   In Person  Provider:   Shelva Majestic, MD   Your physician wants you to follow-up in: 6 months. You will receive a reminder letter in the mail two months in advance. If you don't receive a letter, please call our office to schedule the follow-up appointment.    Other Instructions None

## 2020-05-08 DIAGNOSIS — H2513 Age-related nuclear cataract, bilateral: Secondary | ICD-10-CM | POA: Diagnosis not present

## 2020-05-08 DIAGNOSIS — H5203 Hypermetropia, bilateral: Secondary | ICD-10-CM | POA: Diagnosis not present

## 2020-05-08 DIAGNOSIS — H40033 Anatomical narrow angle, bilateral: Secondary | ICD-10-CM | POA: Diagnosis not present

## 2020-05-14 ENCOUNTER — Other Ambulatory Visit: Payer: Self-pay | Admitting: *Deleted

## 2020-05-14 DIAGNOSIS — D472 Monoclonal gammopathy: Secondary | ICD-10-CM

## 2020-05-14 DIAGNOSIS — D696 Thrombocytopenia, unspecified: Secondary | ICD-10-CM

## 2020-05-14 NOTE — Progress Notes (Signed)
HEMATOLOGY/ONCOLOGY CLINIC NOTE  Date of Service: 05/22/2020   Patient Care Team: Susy Frizzle, MD as PCP - General (Family Medicine) Troy Sine, MD as PCP - Cardiology (Cardiology)  Dr. Jeneen Rinks [place] in Nephrology  CHIEF COMPLAINTS/PURPOSE OF CONSULTATION:  Follow up for Thrombocytopenia and MGUS   HISTORY OF PRESENTING ILLNESS:   Martin Cordova is a wonderful 79 y.o. male who has been referred to [place] by Dr Claiborne Billings for evaluation and management of thrombocytopenia.  Patient has a h/o HTN, HLD, DM2, sleep apnea, GERD, CAD, obesity and NASH who was referred for evaluation of thrombocytopenia.  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  Hgb and WBC count have been WNL. No issues with bleeding or bruising, No recent new medications. He is on chronic PPI therapy. He is chronic anticoagulation for his atrial fibrillation.  No other acute new concerns.  INTERVAL HISTORY:  [name] returns today for management and evaluation of his MGUS. The patient's last visit with [place] was on 05/24/2019. The pt reports that he is doing well overall.  The pt reports that he has felt well in the interim and denies any new symptoms. Pt continues following with a Nephrologist to monitor his renal function. He has received his COVID19 booster and annual flu vaccine.   Lab results (05/15/20) of CBC w/diff and CMP is as follows: all values are WNL except for PLT at 113K, Glucose at 156, BUN at 36, Creatinine at 2.57, Calcium at 10.4, GFR Est at 25. 05/15/2020 MMP shows all values are WNL except for M Protein at 0.8  On review of systems, pt denies fevers, chills, new bone pain, unexpected weight loss, back pain, abdominal pain and any other symptoms.   MEDICAL HISTORY:  Past Medical History:  Diagnosis Date  . Colon polyps   . Diabetes mellitus without complication ([place])   . Diverticulitis   . Diverticulosis    . GERD 12/07/2006  . HYPERGLYCEMIA 12/07/2006  . HYPERLIPIDEMIA 12/07/2006   mixed wth an atherogenic dyslipidemic pattern  . HYPERTENSION 12/07/2006  . Nephrolithiasis   . Nonalcoholic fatty liver disease   . Normal coronary arteries 04/06/2007   by cath EF 55%., last ECHO 10/13/10 EF>55% mild MR, last nuc 06/16/10 EF 57% low risk scan  . OBESITY 11/10/2009  . OSA treated with BiPAP 10/22/2007  . PAF (paroxysmal atrial fibrillation) ([place]) 04/09/2013  . Palpitations    tachy  . Pulmonary HTN (HCC)    RV syst.  40-64mHg- moderate by echo  . Renal insufficiency    Cr to 3 on ACE  . Sleep apnea   . SOB (shortness of breath)    with exertion  . THROMBOCYTOPENIA 12/07/2006  . TRANSAMINASES, SERUM, ELEVATED 12/07/2006  . 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 kClaiborne Billings   SURGICAL HISTORY: Past Surgical History:  Procedure Laterality Date  . A-FLUTTER ABLATION N/A 04/19/2017   Procedure: A-FLUTTER ABLATION;  Surgeon: KDeboraha Sprang MD;  [facility];  Service: Cardiovascular;  Laterality: N/A;  . CARDIAC CATHETERIZATION  04/06/2007   normal cors  . CARDIOVERSION N/A 08/05/2013   Procedure: CARDIOVERSION;  Surgeon: TTroy Sine MD;  [place] [facility]  Service: Cardiovascular;  Laterality: N/A;  . CARDIOVERSION N/A 12/26/2016   Procedure: CARDIOVERSION;  Surgeon: KTroy Sine MD;  [facility];  Service: Cardiovascular;  Laterality: N/A;  . CHOLECYSTECTOMY    .  CYSTOSCOPY/RETROGRADE/URETEROSCOPY  08/19/2011   Procedure: CYSTOSCOPY/RETROGRADE/URETEROSCOPY;  Surgeon: Hanley Ben, MD;  Location[facility];  Service: Urology;  Laterality: Right;  JJ STENT PLACEMENT  . ROTATOR CUFF REPAIR Right     SOCIAL HISTORY: Social History   Socioeconomic History  . Marital status: Married    Spouse name: Not on file  . Number of children: 4  . Years of education: Not on file  . Highest education level: Not on file  Occupational  History  . Occupation: retired Pharmacist, hospital  Tobacco Use  . Smoking status: Former Smoker    Years: 10.00    Types: Cigarettes    Quit date: 08/04/1980    Years since quitting: 39.8  . Smokeless tobacco: Former Systems developer    Types: Chew    Quit date: 08/08/2004  Vaping Use  . Vaping Use: Never used  Substance and Sexual Activity  . Alcohol use: Yes    Alcohol/week: 0.0 standard drinks    Comment: 1 beer 2-3 times per month  . Drug use: No  . Sexual activity: Yes  Other Topics Concern  . Not on file  Social History Narrative  . Not on file   Social Determinants of Health   Financial Resource Strain: Not on file  Food Insecurity: Not on file  Transportation Needs: Not on file  Physical Activity: Not on file  Stress: Not on file  Social Connections: Not on file  Intimate Partner Violence: Not on file    FAMILY HISTORY: Family History  Problem Relation Age of Onset  . Diabetes Mother   . Heart failure Mother   . Stroke Mother   . Kidney failure Father   . Diabetes Father   . Cancer Paternal Uncle   . Colon cancer Neg Hx   . Esophageal cancer Neg Hx   . Stomach cancer Neg Hx   . Rectal cancer Neg Hx     ALLERGIES:  is allergic to lisinopril, dulaglutide, and ibuprofen.  MEDICATIONS:  Current Outpatient Medications  Medication Sig Dispense Refill  . atorvastatin (LIPITOR) 10 MG tablet Take 1 tablet (10 mg total) by mouth at bedtime. TAKE 1 TABLET BY MOUTH EVERYDAY AT BEDTIME 90 tablet 3  . calcitRIOL (ROCALTROL) 0.25 MCG capsule     . carvedilol (COREG) 25 MG tablet TAKE 1 TABLET BY MOUTH TWICE A DAY WITH A MEAL 180 tablet 1  . diltiazem (CARDIZEM CD) 240 MG 24 hr capsule Take 1 capsule (240 mg total) by mouth at bedtime. 90 capsule 3  . ELIQUIS 5 MG TABS tablet TAKE 1 TABLET BY MOUTH TWICE A DAY NEEDS APPT FOR REFILLS 180 tablet 1  . famotidine (PEPCID) 10 MG tablet Take 1 tablet by mouth daily.    . furosemide (LASIX) 40 MG tablet Take 1 tablet by mouth daily.    Marland Kitchen  linagliptin (TRADJENTA) 5 MG TABS tablet Take 5 mg by mouth daily.    Marland Kitchen omega-3 acid ethyl esters (LOVAZA) 1 g capsule TAKE 2 CAPSULES BY MOUTH EVERY DAY NEEDS APPT FOR REFILLS 180 capsule 3  . potassium citrate (UROCIT-K) 10 MEQ (1080 MG) SR tablet Take 10 mEq by mouth 2 (two) times daily.     . sildenafil (VIAGRA) 100 MG tablet Take 0.5-1 tablets (50-100 mg total) by mouth daily as needed for erectile dysfunction. 5 tablet 11   Current Facility-Administered Medications  Medication Dose Route Frequency Provider Last Rate Last Admin  . 0.9 %  sodium chloride infusion  500 mL Intravenous Once  Armbruster, Carlota Raspberry, MD        REVIEW OF SYSTEMS:  A 10+ POINT REVIEW OF SYSTEMS WAS OBTAINED including neurology, dermatology, psychiatry, cardiac, respiratory, lymph, extremities, GI, GU, Musculoskeletal, constitutional, breasts, reproductive, HEENT.  All pertinent positives are noted in the HPI.  All others are negative.   PHYSICAL EXAMINATION: ECOG FS:1 - Symptomatic but completely ambulatory  Vitals:   05/22/20 0849  BP: 135/80  Pulse: 75  Resp: 18  Temp: (!) 96 F (35.6 C)  SpO2: 98%   Wt Readings from Last 3 Encounters:  05/22/20 244 lb 4.8 oz (110.8 kg)  05/04/20 245 lb 6.4 oz (111.3 kg)  12/20/19 246 lb (111.6 kg)   Body mass index is 34.07 kg/m.    GENERAL:alert, in no acute distress and comfortable SKIN: no acute rashes, no significant lesions EYES: conjunctiva are pink and non-injected, sclera anicteric OROPHARYNX: MMM, no exudates, no oropharyngeal erythema or ulceration NECK: supple, no JVD LYMPH:  no palpable lymphadenopathy in the cervical, axillary or inguinal regions LUNGS: clear to auscultation b/l with normal respiratory effort HEART: regular rate & rhythm ABDOMEN:  normoactive bowel sounds , non tender, not distended. No palpable hepatosplenomegaly.  Extremity: no pedal edema PSYCH: alert & oriented x 3 with fluent speech NEURO: no focal motor/sensory  deficits  LABORATORY DATA:  I have reviewed the data as listed  Component     Latest Ref Rng & Units 11/16/2015  WBC     4.0 - 10.3 10e3/uL 4.8  NEUT#     1.5 - 6.5 10e3/uL 3.2  Hemoglobin     13.0 - 17.1 g/dL 14.0  HCT     38.4 - 49.9 % 41.2  Platelets     140 - 400 10e3/uL 100 (L)  MCV     79.3 - 98.0 fL 91.6  MCH     27.2 - 33.4 pg 31.1  MCHC     32.0 - 36.0 g/dL 34.0  RBC     4.20 - 5.82 10e6/uL 4.50  RDW     11.0 - 14.6 % 13.2  lymph#     0.9 - 3.3 10e3/uL 1.0  MONO#     0.1 - 0.9 10e3/uL 0.5  Eosinophils Absolute     0.0 - 0.5 10e3/uL 0.1  Basophils Absolute     0.0 - 0.1 10e3/uL 0.0  NEUT%     39.0 - 75.0 % 66.0  LYMPH%     14.0 - 49.0 % 21.3  MONO%     0.0 - 14.0 % 10.0  EOS%     0.0 - 7.0 % 2.3  BASO%     0.0 - 2.0 % 0.4  Retic %     0.80 - 1.80 % 1.40  Retic Ct Abs     34.80 - 93.90 10e3/uL 63.00  Immature Retic Fract     3.00 - 10.60 % 5.20  Sodium     136 - 145 mEq/L 143  Potassium     3.5 - 5.1 mEq/L 4.8  Chloride     98 - 109 mEq/L 109  CO2     22 - 29 mEq/L 30 (H)  Glucose     70 - 140 mg/dl 85  BUN     7.0 - 26.0 mg/dL 22.6  Creatinine     0.7 - 1.3 mg/dL 1.7 (H)  Total Bilirubin     0.20 - 1.20 mg/dL 0.88  Alkaline Phosphatase     40 - 150 U/L 38 (L)  AST     5 - 34 U/L 17  ALT     0 - 55 U/L 24  Total Protein     6.4 - 8.3 g/dL 7.0  Albumin     3.5 - 5.0 g/dL 3.8  Calcium     8.4 - 10.4 mg/dL 9.5  Anion gap     3 - 11 mEq/L 4  EGFR     >90 ml/min/1.73 m2 40 (L)  HCV Ab     0.0 - 0.9 s/co ratio <0.1  Comment      Comment  HIV     Non Reactive Non Reactive  Vitamin B12     211 - 946 pg/mL 583  . CBC Latest Ref Rng & Units 05/15/2020 12/20/2019 05/17/2019  WBC 4.0 - 10.5 K/uL 5.1 5.7 4.6  Hemoglobin 13.0 - 17.0 g/dL 15.0 14.9 13.3  Hematocrit 39.0 - 52.0 % 45.3 44.3 41.0  Platelets 150 - 400 K/uL 113(L) 119(L) 99(L)     . CBC    Component Value Date/Time   WBC 5.1 05/15/2020 0816   WBC 5.7 12/20/2019  0856   RBC 4.92 05/15/2020 0816   HGB 15.0 05/15/2020 0816   HGB 13.5 04/10/2017 0854   HGB 14.2 05/17/2016 0756   HCT 45.3 05/15/2020 0816   HCT 41.1 04/10/2017 0854   HCT 42.5 05/17/2016 0756   PLT 113 (L) 05/15/2020 0816   PLT 145 (L) 04/10/2017 0854   MCV 92.1 05/15/2020 0816   MCV 91 04/10/2017 0854   MCV 92.4 05/17/2016 0756   MCH 30.5 05/15/2020 0816   MCHC 33.1 05/15/2020 0816   RDW 13.0 05/15/2020 0816   RDW 14.8 04/10/2017 0854   RDW 12.8 05/17/2016 0756   LYMPHSABS 1.0 05/15/2020 0816   LYMPHSABS 0.8 04/10/2017 0854   LYMPHSABS 1.1 05/17/2016 0756   MONOABS 0.4 05/15/2020 0816   MONOABS 0.7 05/17/2016 0756   EOSABS 0.1 05/15/2020 0816   EOSABS 0.2 04/10/2017 0854   BASOSABS 0.0 05/15/2020 0816   BASOSABS 0.0 04/10/2017 0854   BASOSABS 0.0 05/17/2016 0756    . CMP Latest Ref Rng & Units 05/15/2020 12/20/2019 05/17/2019  Glucose 70 - 99 mg/dL 156(H) 125(H) 181(H)  BUN 8 - 23 mg/dL 36(H) 34(H) 36(H)  Creatinine 0.61 - 1.24 mg/dL 2.57(H) 2.24(H) 2.18(H)  Sodium 135 - 145 mmol/L 141 142 142  Potassium 3.5 - 5.1 mmol/L 4.3 4.2 4.3  Chloride 98 - 111 mmol/L 105 105 104  CO2 22 - 32 mmol/L _0 Calcium 8.9 - 10.3 mg/dL 10.4(H) 9.6 9.2  Total Protein 6.5 - 8.1 g/dL 7.3 6.9 7.0  Total Bilirubin 0.3 - 1.2 mg/dL 0.9 0.7 0.8  Alkaline Phos 38 - 126 U/L 41 - 47  AST 15 - 41 U/L _1 ALT 0 - 44 U/L 32 26 29  01/25/2019 ECHOCARDIOGRAM  OUTSIDE LABS from [facility] 08/07/17       RADIOGRAPHIC STUDIES: I have personally reviewed the radiological images as listed and agreed with the findings in the report.  US Renal 08/10/17  IMPRESSION: Multiple renal cysts of varying sizes are noted bilaterally consistent with polycystic kidney disease. Increased echogenicity of renal parenchyma is noted consistent with medical renal disease. No hydronephrosis or renal obstruction is noted   ABDOMEN ULTRASOUND COMPLETE  COMPARISON:  CT urogram of August 15, 2011  and a right upper quadrant ultrasound of September 23, 2008  FINDINGS: Gallbladder: The gallbladder is surgically absent.  Common bile  duct: Diameter: 5 mm where visualized  Liver: Bowel gas obscures the left hepatic lobe. The echotexture of the liver is somewhat heterogeneously increased. There is no discrete mass or ductal dilation.  IVC: Largely obscured by bowel gas.  Pancreas: Obscured by bowel gas.  Spleen: Normal in size and echotexture  Right Kidney: Length: 11.3 cm. The renal cortical echotexture is increased. There are multiple cysts in the right kidney with the largest measuring 5.6 x 4.3 x 4.2 cm. No hydronephrosis is observed.  Left Kidney: Length: 21 cm. The renal cortical echotexture is increased. There are multiple cysts with the largest measuring 9.1 x 6.5 x 9.2 cm.  Abdominal aorta: No aneurysm visualized. Limited visualization due to bowel gas.  Other findings: None.  IMPRESSION: 1. Limited visualization of the liver. The left lobe is not well visualized. The hepatic echotexture is heterogeneously increased without evidence of a discrete mass. The gallbladder is surgically absent. 2. Large kidneys with increased parenchymal echotexture and multiple large cysts. These findings have been demonstrated in the past. 3. Limited visualization of the CBD, IVC, abdominal aorta, and pancreas due to bowel gas and the patient's body habitus.   .No results found.    ASSESSMENT & PLAN:   79 y.o. caucasian male with multiple medical co-morbidities as noted above with   1) Chronic mild Isolated thrombocytopenia ( plt 90-110k).  Patient thrombocytopenia hasn't changed significantly since 2014. 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 Medication effect could be an additional element (propafenone, ppi ) Cannot  r/o an immune component. No evidence of pseudothrombocytopenia on PBS. HIV and Hep C neg B12 wnl  2) MGUS - IgG Lambda  09/2017 24 urine showed M-Protein positive in his urine at 0.9g/mL  10/23/17 Bone Survey revealed No evidence of multiple myeloma. Multifocal degenerative changes as described.  Component     Latest Ref Rng & Units 10/17/2017  IgG (Immunoglobin G), Serum     700 - 1,600 mg/dL 1,171  IgA     61 - 437 mg/dL 104  IgM (Immunoglobulin M), Srm     15 - 143 mg/dL 37  Total Protein ELP     6.0 - 8.5 g/dL 7.1  Albumin SerPl Elph-Mcnc     2.9 - 4.4 g/dL 3.9  Alpha 1     0.0 - 0.4 g/dL 0.2  Alpha2 Glob SerPl Elph-Mcnc     0.4 - 1.0 g/dL 0.7  B-Globulin SerPl Elph-Mcnc     0.7 - 1.3 g/dL 1.0  Gamma Glob SerPl Elph-Mcnc     0.4 - 1.8 g/dL 1.2  M Protein SerPl Elph-Mcnc     Not Observed g/dL 0.9 (H)  Globulin, Total     2.2 - 3.9 g/dL 3.2  Albumin/Glob SerPl     0.7 - 1.7 1.3  IFE 1      Comment  Please Note (HCV):      Comment  Kappa free light chain     3.3 - 19.4 mg/L 32.7 (H)  Lamda free light chains     5.7 - 26.3 mg/L 95.0 (H)  Kappa, lamda light chain ratio     0.26 - 1.65 0.34  Sed Rate     0 - 16 mm/hr 2  Beta-2 Microglobulin     0.6 - 2.4 mg/L 3.0 (H)  LDH     125 - 245 U/L 390 (H)  3) CKD Stage III, nephrolithiasis, Polycystic Kidneys -Pt's father died from renal failure in his 2s.  -cysts were seen on 08/10/17 US renal  -He will continue to be seen by urologist who suggested he go on a low salt and low sugar diet.    PLAN  -Discussed pt labwork, 05/15/20; blood counts are nml, PLT are stable - likely from NASH, kidney numbers are increasing, blood chemistries reflect dehydration, M Protein is stable at 0.8 g/dL.   -No lab or clinical evidence of MGUS progression at this time. Will continue watchful observation.  -Recommended that the pt drink at least 48-64 oz of water each day. -Recommend pt continue f/u with Nephrologist -Will see back  in 12 months, with labs 1 week prior   FOLLOW UP:  RTC with Dr Irene Limbo with labs in 12 months. Plz schedule these labs 1 week prior to clinic visit   The total time spent in the appt was 20 minutes and more than 50% was on counseling and direct patient cares.  All of the patient's questions were answered with apparent satisfaction. The patient knows to call the clinic with any problems, questions or concerns.    Sullivan Lone MD [place] AAHIVMS [facility] Hematology/Oncology Physician [facility]  (Office):       [phone] (Work cell):  [phone] (Fax):           [phone]  I, Yevette Edwards, am acting as a scribe for Dr. Sullivan Lone.   .I have reviewed the above documentation for accuracy and completeness, and I agree with the above. Brunetta Genera MD

## 2020-05-15 ENCOUNTER — Other Ambulatory Visit: Payer: Self-pay

## 2020-05-15 ENCOUNTER — Inpatient Hospital Stay: Payer: Medicare PPO | Attending: Hematology

## 2020-05-15 DIAGNOSIS — D696 Thrombocytopenia, unspecified: Secondary | ICD-10-CM | POA: Insufficient documentation

## 2020-05-15 DIAGNOSIS — D472 Monoclonal gammopathy: Secondary | ICD-10-CM | POA: Insufficient documentation

## 2020-05-15 LAB — CBC WITH DIFFERENTIAL (CANCER CENTER ONLY)
Abs Immature Granulocytes: 0.01 10*3/uL (ref 0.00–0.07)
Basophils Absolute: 0 10*3/uL (ref 0.0–0.1)
Basophils Relative: 0 %
Eosinophils Absolute: 0.1 10*3/uL (ref 0.0–0.5)
Eosinophils Relative: 2 %
HCT: 45.3 % (ref 39.0–52.0)
Hemoglobin: 15 g/dL (ref 13.0–17.0)
Immature Granulocytes: 0 %
Lymphocytes Relative: 19 %
Lymphs Abs: 1 10*3/uL (ref 0.7–4.0)
MCH: 30.5 pg (ref 26.0–34.0)
MCHC: 33.1 g/dL (ref 30.0–36.0)
MCV: 92.1 fL (ref 80.0–100.0)
Monocytes Absolute: 0.4 10*3/uL (ref 0.1–1.0)
Monocytes Relative: 9 %
Neutro Abs: 3.5 10*3/uL (ref 1.7–7.7)
Neutrophils Relative %: 70 %
Platelet Count: 113 10*3/uL — ABNORMAL LOW (ref 150–400)
RBC: 4.92 MIL/uL (ref 4.22–5.81)
RDW: 13 % (ref 11.5–15.5)
WBC Count: 5.1 10*3/uL (ref 4.0–10.5)
nRBC: 0 % (ref 0.0–0.2)

## 2020-05-15 LAB — CMP (CANCER CENTER ONLY)
ALT: 32 U/L (ref 0–44)
AST: 20 U/L (ref 15–41)
Albumin: 4 g/dL (ref 3.5–5.0)
Alkaline Phosphatase: 41 U/L (ref 38–126)
Anion gap: 9 (ref 5–15)
BUN: 36 mg/dL — ABNORMAL HIGH (ref 8–23)
CO2: 27 mmol/L (ref 22–32)
Calcium: 10.4 mg/dL — ABNORMAL HIGH (ref 8.9–10.3)
Chloride: 105 mmol/L (ref 98–111)
Creatinine: 2.57 mg/dL — ABNORMAL HIGH (ref 0.61–1.24)
GFR, Estimated: 25 mL/min — ABNORMAL LOW (ref >60)
Glucose, Bld: 156 mg/dL — ABNORMAL HIGH (ref 70–99)
Potassium: 4.3 mmol/L (ref 3.5–5.1)
Sodium: 141 mmol/L (ref 135–145)
Total Bilirubin: 0.9 mg/dL (ref 0.3–1.2)
Total Protein: 7.3 g/dL (ref 6.5–8.1)

## 2020-05-18 LAB — MULTIPLE MYELOMA PANEL, SERUM
Albumin SerPl Elph-Mcnc: 3.7 g/dL (ref 2.9–4.4)
Albumin/Glob SerPl: 1.3 (ref 0.7–1.7)
Alpha 1: 0.2 g/dL (ref 0.0–0.4)
Alpha2 Glob SerPl Elph-Mcnc: 0.7 g/dL (ref 0.4–1.0)
B-Globulin SerPl Elph-Mcnc: 1 g/dL (ref 0.7–1.3)
Gamma Glob SerPl Elph-Mcnc: 1.1 g/dL (ref 0.4–1.8)
Globulin, Total: 3 g/dL (ref 2.2–3.9)
IgA: 106 mg/dL (ref 61–437)
IgG (Immunoglobin G), Serum: 1137 mg/dL (ref 603–1613)
IgM (Immunoglobulin M), Srm: 35 mg/dL (ref 15–143)
M Protein SerPl Elph-Mcnc: 0.8 g/dL — ABNORMAL HIGH
Total Protein ELP: 6.7 g/dL (ref 6.0–8.5)

## 2020-05-22 ENCOUNTER — Inpatient Hospital Stay: Payer: Medicare PPO | Admitting: Hematology

## 2020-05-22 ENCOUNTER — Other Ambulatory Visit: Payer: Self-pay

## 2020-05-22 VITALS — BP 135/80 | HR 75 | Temp 96.0°F | Resp 18 | Ht 71.0 in | Wt 244.3 lb

## 2020-05-22 DIAGNOSIS — D696 Thrombocytopenia, unspecified: Secondary | ICD-10-CM | POA: Diagnosis not present

## 2020-05-22 DIAGNOSIS — D472 Monoclonal gammopathy: Secondary | ICD-10-CM

## 2020-06-15 DIAGNOSIS — G4733 Obstructive sleep apnea (adult) (pediatric): Secondary | ICD-10-CM | POA: Diagnosis not present

## 2020-06-15 DIAGNOSIS — G4731 Primary central sleep apnea: Secondary | ICD-10-CM | POA: Diagnosis not present

## 2020-07-09 DIAGNOSIS — H25812 Combined forms of age-related cataract, left eye: Secondary | ICD-10-CM | POA: Diagnosis not present

## 2020-07-09 DIAGNOSIS — H2512 Age-related nuclear cataract, left eye: Secondary | ICD-10-CM | POA: Diagnosis not present

## 2020-07-16 DIAGNOSIS — Z87442 Personal history of urinary calculi: Secondary | ICD-10-CM | POA: Diagnosis not present

## 2020-07-16 DIAGNOSIS — N5201 Erectile dysfunction due to arterial insufficiency: Secondary | ICD-10-CM | POA: Diagnosis not present

## 2020-07-31 DIAGNOSIS — N1832 Chronic kidney disease, stage 3b: Secondary | ICD-10-CM | POA: Diagnosis not present

## 2020-07-31 DIAGNOSIS — N183 Chronic kidney disease, stage 3 unspecified: Secondary | ICD-10-CM | POA: Diagnosis not present

## 2020-07-31 DIAGNOSIS — E1122 Type 2 diabetes mellitus with diabetic chronic kidney disease: Secondary | ICD-10-CM | POA: Diagnosis not present

## 2020-07-31 DIAGNOSIS — E669 Obesity, unspecified: Secondary | ICD-10-CM | POA: Diagnosis not present

## 2020-07-31 DIAGNOSIS — Q613 Polycystic kidney, unspecified: Secondary | ICD-10-CM | POA: Diagnosis not present

## 2020-08-03 DIAGNOSIS — L814 Other melanin hyperpigmentation: Secondary | ICD-10-CM | POA: Diagnosis not present

## 2020-08-03 DIAGNOSIS — L853 Xerosis cutis: Secondary | ICD-10-CM | POA: Diagnosis not present

## 2020-08-03 DIAGNOSIS — L918 Other hypertrophic disorders of the skin: Secondary | ICD-10-CM | POA: Diagnosis not present

## 2020-08-03 DIAGNOSIS — L570 Actinic keratosis: Secondary | ICD-10-CM | POA: Diagnosis not present

## 2020-08-03 DIAGNOSIS — L821 Other seborrheic keratosis: Secondary | ICD-10-CM | POA: Diagnosis not present

## 2020-08-03 DIAGNOSIS — Z85828 Personal history of other malignant neoplasm of skin: Secondary | ICD-10-CM | POA: Diagnosis not present

## 2020-08-03 DIAGNOSIS — L578 Other skin changes due to chronic exposure to nonionizing radiation: Secondary | ICD-10-CM | POA: Diagnosis not present

## 2020-08-03 DIAGNOSIS — D2239 Melanocytic nevi of other parts of face: Secondary | ICD-10-CM | POA: Diagnosis not present

## 2020-08-06 DIAGNOSIS — H2511 Age-related nuclear cataract, right eye: Secondary | ICD-10-CM | POA: Diagnosis not present

## 2020-08-06 DIAGNOSIS — H25811 Combined forms of age-related cataract, right eye: Secondary | ICD-10-CM | POA: Diagnosis not present

## 2020-08-11 DIAGNOSIS — I4891 Unspecified atrial fibrillation: Secondary | ICD-10-CM | POA: Diagnosis not present

## 2020-08-11 DIAGNOSIS — N183 Chronic kidney disease, stage 3 unspecified: Secondary | ICD-10-CM | POA: Diagnosis not present

## 2020-08-11 DIAGNOSIS — N2581 Secondary hyperparathyroidism of renal origin: Secondary | ICD-10-CM | POA: Diagnosis not present

## 2020-08-11 DIAGNOSIS — E1122 Type 2 diabetes mellitus with diabetic chronic kidney disease: Secondary | ICD-10-CM | POA: Diagnosis not present

## 2020-08-11 DIAGNOSIS — I129 Hypertensive chronic kidney disease with stage 1 through stage 4 chronic kidney disease, or unspecified chronic kidney disease: Secondary | ICD-10-CM | POA: Diagnosis not present

## 2020-09-05 ENCOUNTER — Other Ambulatory Visit: Payer: Self-pay

## 2020-09-05 ENCOUNTER — Encounter (HOSPITAL_COMMUNITY): Payer: Self-pay | Admitting: Emergency Medicine

## 2020-09-05 ENCOUNTER — Emergency Department (HOSPITAL_COMMUNITY)
Admission: EM | Admit: 2020-09-05 | Discharge: 2020-09-05 | Disposition: A | Discharge status: Home / Self Care | Payer: Medicare PPO | Attending: Emergency Medicine | Admitting: Emergency Medicine

## 2020-09-05 DIAGNOSIS — Y9389 Activity, other specified: Secondary | ICD-10-CM | POA: Diagnosis not present

## 2020-09-05 DIAGNOSIS — Z87891 Personal history of nicotine dependence: Secondary | ICD-10-CM | POA: Diagnosis not present

## 2020-09-05 DIAGNOSIS — S81012A Laceration without foreign body, left knee, initial encounter: Secondary | ICD-10-CM | POA: Diagnosis not present

## 2020-09-05 DIAGNOSIS — W312XXA Contact with powered woodworking and forming machines, initial encounter: Secondary | ICD-10-CM | POA: Diagnosis not present

## 2020-09-05 DIAGNOSIS — Z79899 Other long term (current) drug therapy: Secondary | ICD-10-CM | POA: Diagnosis not present

## 2020-09-05 DIAGNOSIS — S8992XA Unspecified injury of left lower leg, initial encounter: Secondary | ICD-10-CM | POA: Diagnosis present

## 2020-09-05 DIAGNOSIS — I10 Essential (primary) hypertension: Secondary | ICD-10-CM | POA: Insufficient documentation

## 2020-09-05 DIAGNOSIS — I480 Paroxysmal atrial fibrillation: Secondary | ICD-10-CM | POA: Diagnosis not present

## 2020-09-05 DIAGNOSIS — Z7984 Long term (current) use of oral hypoglycemic drugs: Secondary | ICD-10-CM | POA: Insufficient documentation

## 2020-09-05 DIAGNOSIS — E119 Type 2 diabetes mellitus without complications: Secondary | ICD-10-CM | POA: Insufficient documentation

## 2020-09-05 DIAGNOSIS — Z7901 Long term (current) use of anticoagulants: Secondary | ICD-10-CM | POA: Diagnosis not present

## 2020-09-05 MED ORDER — LIDOCAINE-EPINEPHRINE (PF) 2 %-1:200000 IJ SOLN
20.0000 mL | Freq: Once | INTRAMUSCULAR | Status: AC
  Administered 2020-09-05: 20 mL
  Filled 2020-09-05: qty 20

## 2020-09-05 NOTE — ED Triage Notes (Signed)
Pt states that he cut his left knee on a hedge trimmer x 4 hours ago. Pt states it is a small laceration. Pt is on eliquis

## 2020-09-05 NOTE — Discharge Instructions (Addendum)
It was our pleasure to provide your ER care today - we hope that you feel better.  Keep wound very clean. Avoid any kneeling on knee, or flexing knee fully, as that may cause sutures to tear through and wound to open.   Follow up with primary care doctor, or urgent care, in two weeks time for suture removal.  Return to ER right away if worse, new symptoms, fevers, severe pain, spreading redness, pus from wound, or other concern.

## 2020-09-05 NOTE — ED Provider Notes (Signed)
[facility] EMERGENCY DEPARTMENT Provider Note   CSN: [id number] Arrival date & time: 09/05/20  1910     History Chief Complaint  Patient presents with  . Laceration    Martin Cordova is a 80 y.o. male.  Patient c/o laceration to anterior left knee. Was using a brand new hedge trimmer, when it accidentally contacted his left knee. 2 cm laceration to knee. Pt is on eliquis, minor bleeding improved w pressure. No other pain or injury. Tetanus is up to date. No faintness or dizziness. No numbness/weakness to leg. Denies any fb/branch, etc contacting area/skin.   The history is provided by the patient.  Laceration Associated symptoms: no fever        Past Medical History:  Diagnosis Date  . Colon polyps   . Diabetes mellitus without complication ([place])   . Diverticulitis   . Diverticulosis   . GERD 12/07/2006  . HYPERGLYCEMIA 12/07/2006  . HYPERLIPIDEMIA 12/07/2006   mixed wth an atherogenic dyslipidemic pattern  . HYPERTENSION 12/07/2006  . Nephrolithiasis   . Nonalcoholic fatty liver disease   . Normal coronary arteries 04/06/2007   by cath EF 55%., last ECHO 10/13/10 EF>55% mild MR, last nuc 06/16/10 EF 57% low risk scan  . OBESITY 11/10/2009  . OSA treated with BiPAP 10/22/2007  . PAF (paroxysmal atrial fibrillation) ([place]) 04/09/2013  . Palpitations    tachy  . Pulmonary HTN (HCC)    RV syst.  40-42mmHg- moderate by echo  . Renal insufficiency    Cr to 3 on ACE  . Sleep apnea   . SOB (shortness of breath)    with exertion  . THROMBOCYTOPENIA 12/07/2006  . TRANSAMINASES, SERUM, ELEVATED 12/07/2006  . 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 Claiborne Billings    Patient Active Problem List   Diagnosis Date Noted  . Palpitations   . Nonalcoholic fatty liver disease   . Nephrolithiasis   . Diverticulosis   . Diverticulitis   . Diabetes mellitus without complication ([place])   . Colon polyps   . Thrombocytopenia ([place]) 11/16/2015  . Anticoagulation adequate 10/23/2015  .  Trigeminy 05/14/2014  . Hyperlipidemia LDL goal <70 04/07/2014  . Essential hypertension 04/07/2014  . Type 2 diabetes mellitus ([place]) 12/02/2013  . Personal history of colonic polyps 11/08/2013  . 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/08/2013  . Atrial flutter ([place]) 07/29/2013  . PAF (paroxysmal atrial fibrillation) ([place]) 04/09/2013  . Edema 11/05/2012  . Pulmonary HTN ([place])   . Renal insufficiency 06/01/2011  . Obesity 11/10/2009  . OBESITY 11/10/2009  . Sleep apnea 10/22/2007  . OSA treated with BiPAP 10/22/2007  . Normal coronary arteries 04/06/2007  . VENTRICULAR TACHYCARDIA 03/27/2007  . HYPERLIPIDEMIA 12/07/2006  . THROMBOCYTOPENIA 12/07/2006  . HYPERTENSION 12/07/2006  . GERD 12/07/2006  . TRANSAMINASES, SERUM, ELEVATED 12/07/2006  . HYPERGLYCEMIA 12/07/2006    Past Surgical History:  Procedure Laterality Date  . A-FLUTTER ABLATION N/A 04/19/2017   Procedure: A-FLUTTER ABLATION;  Surgeon: Deboraha Sprang, MD;  Location: [place] [facility];  Service: Cardiovascular;  Laterality: N/A;  . CARDIAC CATHETERIZATION  04/06/2007   normal cors  . CARDIOVERSION N/A 08/05/2013   Procedure: CARDIOVERSION;  Surgeon: Troy Sine, MD;  Location: [place];  Service: Cardiovascular;  Laterality: N/A;  . CARDIOVERSION N/A 12/26/2016   Procedure: CARDIOVERSION;  Surgeon: Troy Sine, MD;  Location: [facility] ENDOSCOPY;  Service: Cardiovascular;  Laterality: N/A;  . CHOLECYSTECTOMY    . CYSTOSCOPY/RETROGRADE/URETEROSCOPY  08/19/2011   Procedure: CYSTOSCOPY/RETROGRADE/URETEROSCOPY;  Surgeon:  Hanley Ben, MD;  [facility];  Service: Urology;  Laterality: Right;  JJ STENT PLACEMENT  . ROTATOR CUFF REPAIR Right        Family History  Problem Relation Age of Onset  . Diabetes Mother   . Heart failure Mother   . Stroke Mother   . Kidney failure Father   . Diabetes Father   . Cancer Paternal Uncle   . Colon cancer Neg Hx   . Esophageal cancer Neg Hx   .  Stomach cancer Neg Hx   . Rectal cancer Neg Hx     Social History   Tobacco Use  . Smoking status: Former Smoker    Years: 10.00    Types: Cigarettes    Quit date: 08/04/1980    Years since quitting: 40.1  . Smokeless tobacco: Former Systems developer    Types: Chew    Quit date: 08/08/2004  Vaping Use  . Vaping Use: Never used  Substance Use Topics  . Alcohol use: Yes    Alcohol/week: 0.0 standard drinks    Comment: 1 beer 2-3 times per month  . Drug use: No    Home Medications Prior to Admission medications   Medication Sig Start Date End Date Taking? Authorizing Provider  atorvastatin (LIPITOR) 10 MG tablet Take 1 tablet (10 mg total) by mouth at bedtime. TAKE 1 TABLET BY MOUTH EVERYDAY AT BEDTIME 12/20/19   Troy Sine, MD  calcitRIOL (ROCALTROL) 0.25 MCG capsule  09/28/19   [provider]  carvedilol (COREG) 25 MG tablet TAKE 1 TABLET BY MOUTH TWICE A DAY WITH A MEAL 05/04/20   Troy Sine, MD  diltiazem (CARDIZEM CD) 240 MG 24 hr capsule Take 1 capsule (240 mg total) by mouth at bedtime. 12/16/19   Troy Sine, MD  ELIQUIS 5 MG TABS tablet TAKE 1 TABLET BY MOUTH TWICE A DAY NEEDS APPT FOR REFILLS 05/04/20   Troy Sine, MD  famotidine (PEPCID) 10 MG tablet Take 1 tablet by mouth daily. 04/01/19   [provider]  furosemide (LASIX) 40 MG tablet Take 1 tablet by mouth daily. 04/01/19   [provider]  linagliptin (TRADJENTA) 5 MG TABS tablet Take 5 mg by mouth daily.    [provider]  omega-3 acid ethyl esters (LOVAZA) 1 g capsule TAKE 2 CAPSULES BY MOUTH EVERY DAY NEEDS APPT FOR REFILLS 10/18/19   Troy Sine, MD  potassium citrate (UROCIT-K) 10 MEQ (1080 MG) SR tablet Take 10 mEq by mouth 2 (two) times daily.  03/10/13   [provider]  sildenafil (VIAGRA) 100 MG tablet Take 0.5-1 tablets (50-100 mg total) by mouth daily as needed for erectile dysfunction. 04/28/15   Susy Frizzle, MD    Allergies    Lisinopril,  Dulaglutide, and Ibuprofen  Review of Systems   Review of Systems  Constitutional: Negative for fever.  Gastrointestinal: Negative for nausea.  Skin: Positive for wound.  Neurological: Negative for numbness.    Physical Exam Updated Vital Signs BP (!) 154/89   Pulse (!) 54   Temp 98.4 F (36.9 C)   Resp 16   Ht 1.803 m (5\' 11" )   Wt 111.1 kg   SpO2 99%   BMI 34.17 kg/m   Physical Exam Vitals and nursing note reviewed.  Constitutional:      Appearance: Normal appearance. He is well-developed.  HENT:     Head: Atraumatic.     Nose: Nose normal.  Mouth/Throat:     Mouth: Mucous membranes are moist.  Eyes:     General: No scleral icterus.    Conjunctiva/sclera: Conjunctivae normal.  Neck:     Trachea: No tracheal deviation.  Cardiovascular:     Rate and Rhythm: Normal rate.     Pulses: Normal pulses.  Pulmonary:     Effort: Pulmonary effort is normal. No accessory muscle usage or respiratory distress.  Genitourinary:    Comments: No cva tenderness. Musculoskeletal:     Cervical back: Neck supple.     Comments: 2 cm laceration to anterior left knee. No fb seen or felt. Good passive rom without pain. No effusion. Distal pulses palp.   Skin:    General: Skin is warm and dry.     Findings: No rash.  Neurological:     Mental Status: He is alert.     Comments: Alert, speech clear. LLE motor/sens grossly intact.   Psychiatric:        Mood and Affect: Mood normal.     ED Results / Procedures / Treatments   Labs (all labs ordered are listed, but only abnormal results are displayed) Labs Reviewed - No data to display  EKG None  Radiology No results found.  Procedures .Marland KitchenLaceration Repair  Date/Time: 09/05/2020 8:39 PM Performed by: Lajean Saver, MD Authorized by: Lajean Saver, MD   Consent:    Consent given by:  Patient Anesthesia:    Anesthesia method:  Local infiltration   Local anesthetic:  Lidocaine 2% WITH epi Laceration details:    Location:   Leg   Leg location:  L knee   Length (cm):  2 Exploration:    Contaminated: no   Treatment:    Area cleansed with:  Saline   Irrigation solution:  Sterile saline   Irrigation volume:  500 cc   Irrigation method:  Syringe   Visualized foreign bodies/material removed: no   Skin repair:    Repair method:  Sutures   Suture size:  3-0   Suture material:  Prolene   Suture technique:  Simple interrupted   Number of sutures:  3 Repair type:    Repair type:  Simple Post-procedure details:    Dressing:  Sterile dressing   Procedure completion:  Tolerated well, no immediate complications     Medications Ordered in ED Medications  lidocaine-EPINEPHrine (XYLOCAINE W/EPI) 2 %-1:200000 (PF) injection 20 mL (20 mLs Infiltration Given 09/05/20 2024)    ED Course  I have reviewed the triage vital signs and the nursing notes.  Pertinent labs & imaging results that were available during my care of the patient were reviewed by me and considered in my medical decision making (see chart for details).    MDM Rules/Calculators/A&P                         Wound repaired.   Tetanus is up to date.   Sterile dressing applied. Bleeding appears resolved.   Reviewed nursing notes and prior charts for additional history.   Pt appears stable for d/c.    Final Clinical Impression(s) / ED Diagnoses Final diagnoses:  None    Rx / DC Orders ED Discharge Orders    None       Lajean Saver, MD 09/05/20 2041

## 2020-09-21 ENCOUNTER — Other Ambulatory Visit: Payer: Self-pay

## 2020-09-21 ENCOUNTER — Ambulatory Visit (INDEPENDENT_AMBULATORY_CARE_PROVIDER_SITE_OTHER): Payer: Medicare PPO | Admitting: Family Medicine

## 2020-09-21 DIAGNOSIS — Z4802 Encounter for removal of sutures: Secondary | ICD-10-CM | POA: Diagnosis not present

## 2020-09-21 NOTE — Progress Notes (Signed)
Subjective:    Patient ID: Martin Cordova, male    DOB: 09-08-1940, 80 y.o.   MRN: [id number]  HPI  2 weeks ago, the patient sustained a laceration over the anterior superior left knee using a pair of hedge tremors.  He had to go to the emergency room for stitches.  He received 3, 4-0 Prolene sutures.  He is here today to have the sutures removed.  There is no erythema or warmth.  It is well-healed.  There is no fluctuance or drainage.  He also questions whether he should get the fourth COVID shot which I recommended he should. Past Medical History:  Diagnosis Date  . Colon polyps   . Diabetes mellitus without complication ([place])   . Diverticulitis   . Diverticulosis   . GERD 12/07/2006  . HYPERGLYCEMIA 12/07/2006  . HYPERLIPIDEMIA 12/07/2006   mixed wth an atherogenic dyslipidemic pattern  . HYPERTENSION 12/07/2006  . Nephrolithiasis   . Nonalcoholic fatty liver disease   . Normal coronary arteries 04/06/2007   by cath EF 55%., last ECHO 10/13/10 EF>55% mild MR, last nuc 06/16/10 EF 57% low risk scan  . OBESITY 11/10/2009  . OSA treated with BiPAP 10/22/2007  . PAF (paroxysmal atrial fibrillation) ([place]) 04/09/2013  . Palpitations    tachy  . Pulmonary HTN (HCC)    RV syst.  40-89mmHg- moderate by echo  . Renal insufficiency    Cr to 3 on ACE  . Sleep apnea   . SOB (shortness of breath)    with exertion  . THROMBOCYTOPENIA 12/07/2006  . TRANSAMINASES, SERUM, ELEVATED 12/07/2006  . 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 Claiborne Billings   Past Surgical History:  Procedure Laterality Date  . A-FLUTTER ABLATION N/A 04/19/2017   Procedure: A-FLUTTER ABLATION;  Surgeon: Deboraha Sprang, MD;  Location: [place] [facility];  Service: Cardiovascular;  Laterality: N/A;  . CARDIAC CATHETERIZATION  04/06/2007   normal cors  . CARDIOVERSION N/A 08/05/2013   Procedure: CARDIOVERSION;  Surgeon: Troy Sine, MD;  Location: [place];  Service: Cardiovascular;  Laterality: N/A;  . CARDIOVERSION  N/A 12/26/2016   Procedure: CARDIOVERSION;  Surgeon: Troy Sine, MD;  Location: [facility] ENDOSCOPY;  Service: Cardiovascular;  Laterality: N/A;  . CHOLECYSTECTOMY    . CYSTOSCOPY/RETROGRADE/URETEROSCOPY  08/19/2011   Procedure: CYSTOSCOPY/RETROGRADE/URETEROSCOPY;  Surgeon: Hanley Ben, MD;  [facility];  Service: Urology;  Laterality: Right;  JJ STENT PLACEMENT  . ROTATOR CUFF REPAIR Right    Current Outpatient Medications on File Prior to Visit  Medication Sig Dispense Refill  . atorvastatin (LIPITOR) 10 MG tablet Take 1 tablet (10 mg total) by mouth at bedtime. TAKE 1 TABLET BY MOUTH EVERYDAY AT BEDTIME 90 tablet 3  . calcitRIOL (ROCALTROL) 0.25 MCG capsule     . carvedilol (COREG) 25 MG tablet TAKE 1 TABLET BY MOUTH TWICE A DAY WITH A MEAL 180 tablet 1  . diltiazem (CARDIZEM CD) 240 MG 24 hr capsule Take 1 capsule (240 mg total) by mouth at bedtime. 90 capsule 3  . ELIQUIS 5 MG TABS tablet TAKE 1 TABLET BY MOUTH TWICE A DAY NEEDS APPT FOR REFILLS 180 tablet 1  . famotidine (PEPCID) 10 MG tablet Take 1 tablet by mouth daily.    . furosemide (LASIX) 40 MG tablet Take 1 tablet by mouth daily.    Marland Kitchen linagliptin (TRADJENTA) 5 MG TABS tablet Take 5 mg by mouth daily.    Marland Kitchen omega-3 acid ethyl esters (LOVAZA) 1  g capsule TAKE 2 CAPSULES BY MOUTH EVERY DAY NEEDS APPT FOR REFILLS 180 capsule 3  . potassium citrate (UROCIT-K) 10 MEQ (1080 MG) SR tablet Take 10 mEq by mouth 2 (two) times daily.     . sildenafil (VIAGRA) 100 MG tablet Take 0.5-1 tablets (50-100 mg total) by mouth daily as needed for erectile dysfunction. 5 tablet 11   Current Facility-Administered Medications on File Prior to Visit  Medication Dose Route Frequency Provider Last Rate Last Admin  . 0.9 %  sodium chloride infusion  500 mL Intravenous Once Armbruster, Carlota Raspberry, MD       Allergies  Allergen Reactions  . Lisinopril Swelling and Other (See Comments)    Renal insufficiency also  . Dulaglutide Other  (See Comments)  . Ibuprofen Other (See Comments)    Per the patient, he has "kidney and liver issues" and is NOT suppose to take this   Social History   Socioeconomic History  . Marital status: Married    Spouse name: Not on file  . Number of children: 4  . Years of education: Not on file  . Highest education level: Not on file  Occupational History  . Occupation: retired Pharmacist, hospital  Tobacco Use  . Smoking status: Former Smoker    Years: 10.00    Types: Cigarettes    Quit date: 08/04/1980    Years since quitting: 40.1  . Smokeless tobacco: Former Systems developer    Types: Chew    Quit date: 08/08/2004  Vaping Use  . Vaping Use: Never used  Substance and Sexual Activity  . Alcohol use: Yes    Alcohol/week: 0.0 standard drinks    Comment: 1 beer 2-3 times per month  . Drug use: No  . Sexual activity: Yes  Other Topics Concern  . Not on file  Social History Narrative  . Not on file   Social Determinants of Health   Financial Resource Strain: Not on file  Food Insecurity: Not on file  Transportation Needs: Not on file  Physical Activity: Not on file  Stress: Not on file  Social Connections: Not on file  Intimate Partner Violence: Not on file   Family History  Problem Relation Age of Onset  . Diabetes Mother   . Heart failure Mother   . Stroke Mother   . Kidney failure Father   . Diabetes Father   . Cancer Paternal Uncle   . Colon cancer Neg Hx   . Esophageal cancer Neg Hx   . Stomach cancer Neg Hx   . Rectal cancer Neg Hx      Review of Systems  All other systems reviewed and are negative.      Objective:   Physical Exam Vitals reviewed.  Constitutional:      General: He is not in acute distress.    Appearance: He is well-developed. He is not diaphoretic.  HENT:     Head: Normocephalic and atraumatic.  Neck:     Thyroid: No thyromegaly.     Vascular: No JVD.     Trachea: No tracheal deviation.  Cardiovascular:     Rate and Rhythm: Normal rate and regular  rhythm.     Heart sounds: Normal heart sounds.  Pulmonary:     Effort: Pulmonary effort is normal.     Breath sounds: Normal breath sounds.  Chest:     Chest wall: No tenderness.  Musculoskeletal:        General: No tenderness. Normal range of motion.  Legs:  Skin:    General: Skin is warm.     Findings: No erythema.  Neurological:     Mental Status: He is alert.     Motor: No abnormal muscle tone.           Assessment & Plan:  Visit for suture removal  3 sutures were removed without difficulty.  Wound does not show any evidence of cellulitis or abscess formation.  Recommended using a Band-Aid to keep the area covered for the next week until completely healed.  I also recommended the patient receive his fourth COVID vaccination given his medical comorbidities.

## 2020-09-22 ENCOUNTER — Other Ambulatory Visit: Payer: Self-pay

## 2020-10-01 ENCOUNTER — Other Ambulatory Visit: Payer: Self-pay | Admitting: Cardiovascular Disease

## 2020-10-01 NOTE — Telephone Encounter (Signed)
60m, 110.8kg, scr 2.57 05/15/20, lovw/kelly 05/04/20

## 2020-10-02 ENCOUNTER — Ambulatory Visit (INDEPENDENT_AMBULATORY_CARE_PROVIDER_SITE_OTHER): Payer: Medicare PPO | Admitting: Podiatry

## 2020-10-02 ENCOUNTER — Encounter: Payer: Self-pay | Admitting: Gastroenterology

## 2020-10-02 ENCOUNTER — Other Ambulatory Visit: Payer: Self-pay

## 2020-10-02 ENCOUNTER — Encounter: Payer: Self-pay | Admitting: Podiatry

## 2020-10-02 DIAGNOSIS — B351 Tinea unguium: Secondary | ICD-10-CM | POA: Diagnosis not present

## 2020-10-02 DIAGNOSIS — M79675 Pain in left toe(s): Secondary | ICD-10-CM

## 2020-10-02 DIAGNOSIS — D696 Thrombocytopenia, unspecified: Secondary | ICD-10-CM | POA: Diagnosis not present

## 2020-10-02 DIAGNOSIS — E119 Type 2 diabetes mellitus without complications: Secondary | ICD-10-CM | POA: Diagnosis not present

## 2020-10-02 DIAGNOSIS — N289 Disorder of kidney and ureter, unspecified: Secondary | ICD-10-CM

## 2020-10-02 DIAGNOSIS — M79674 Pain in right toe(s): Secondary | ICD-10-CM | POA: Diagnosis not present

## 2020-10-02 DIAGNOSIS — Z7901 Long term (current) use of anticoagulants: Secondary | ICD-10-CM

## 2020-10-02 NOTE — Progress Notes (Signed)
This patient returns to my office for at risk foot care.  This patient requires this care by a professional since this patient will be at risk due to having diabetes and renal insufficiency and thrombocytopenia..  Patient is taking eliquis.  This patient is unable to cut nails himself since the patient cannot reach his nails.These nails are painful walking and wearing shoes.  This patient presents for at risk foot care today.  General Appearance  Alert, conversant and in no acute stress.  Vascular  Dorsalis pedis and posterior tibial  pulses are palpable  bilaterally.  Capillary return is within normal limits  bilaterally. Temperature is within normal limits  bilaterally.  Neurologic  Senn-Weinstein monofilament wire test within normal limits  bilaterally. Muscle power within normal limits bilaterally.  Nails Thick disfigured discolored nails with subungual debris  from hallux to fifth toes bilaterally. No evidence of bacterial infection or drainage bilaterally.  Orthopedic  No limitations of motion  feet .  No crepitus or effusions noted.  No bony pathology or digital deformities noted.  HAV  B/L.  Skin  normotropic skin with no porokeratosis noted bilaterally.  No signs of infections or ulcers noted.     Onychomycosis  Pain in right toes  Pain in left toes  Consent was obtained for treatment procedures.   Mechanical debridement of nails 1-5  bilaterally performed with a nail nipper.  Filed with dremel without incident.    Return office visit   3 months                   Told patient to return for periodic foot care and evaluation due to potential at risk complications.   Jesson Foskey DPM  

## 2020-10-08 ENCOUNTER — Encounter: Payer: Self-pay | Admitting: Gastroenterology

## 2020-10-09 DIAGNOSIS — H43813 Vitreous degeneration, bilateral: Secondary | ICD-10-CM | POA: Diagnosis not present

## 2020-10-09 LAB — HM DIABETES EYE EXAM

## 2020-10-13 ENCOUNTER — Telehealth: Payer: Self-pay | Admitting: Family Medicine

## 2020-10-13 NOTE — Telephone Encounter (Signed)
Left message for patient to call back and schedule Medicare Annual Wellness Visit (AWV) in office.   If not able to come in office, please offer to do virtually or by telephone.   Last AWV: 12/18/2018   Please schedule at anytime with [facility]-Nurse Health Advisor.  If any questions, please contact me at [phone]

## 2020-10-20 ENCOUNTER — Other Ambulatory Visit: Payer: Self-pay | Admitting: Cardiovascular Disease

## 2020-10-24 ENCOUNTER — Ambulatory Visit
Admission: RE | Admit: 2020-10-24 | Discharge: 2020-10-24 | Disposition: A | Discharge status: Home / Self Care | Payer: Medicare PPO | Source: Ambulatory Visit | Attending: Emergency Medicine | Admitting: Emergency Medicine

## 2020-10-24 ENCOUNTER — Other Ambulatory Visit: Payer: Self-pay

## 2020-10-24 VITALS — BP 146/83 | HR 87 | Temp 97.8°F | Resp 16 | Wt 245.0 lb

## 2020-10-24 DIAGNOSIS — K05219 Aggressive periodontitis, localized, unspecified severity: Secondary | ICD-10-CM

## 2020-10-24 DIAGNOSIS — K068 Other specified disorders of gingiva and edentulous alveolar ridge: Secondary | ICD-10-CM

## 2020-10-24 MED ORDER — AMOXICILLIN-POT CLAVULANATE 875-125 MG PO TABS: 1.0000 | ORAL_TABLET | Freq: Two times a day (BID) | ORAL | 0 refills | Status: AC

## 2020-10-24 NOTE — Discharge Instructions (Signed)
Augmentin for infection Recommend soft diet until evaluated by dentist Maintain oral hygiene care Follow up with dentist as soon as possible for further evaluation and treatment  Return or go to the ED if you have any new or worsening symptoms such as fever, chills, difficulty swallowing, painful swallowing, oral or neck swelling, nausea, vomiting, chest pain, SOB, etc..Marland Kitchen

## 2020-10-24 NOTE — ED Provider Notes (Signed)
[place]   [id number] 10/24/20 Arrival Time: 8850  CC: DENTAL PAIN  SUBJECTIVE:  Martin Cordova is a 80 y.o. male who presents with gum pain and concern for infection x 2 days.  Denies a precipitating event or trauma.  Localizes pain to RT upper gum.  Has tried OTC analgesics without relief.  Worse with chewing.  Does see a dentist regularly.  Reports similar symptoms in the past that improved with antibiotic.  Denies fever, chills, dysphagia, odynophagia, oral or neck swelling, nausea, vomiting, chest pain, SOB.    ROS: As per HPI.  All other pertinent ROS negative.     Past Medical History:  Diagnosis Date  . Colon polyps   . Diabetes mellitus without complication ([place])   . Diverticulitis   . Diverticulosis   . GERD 12/07/2006  . HYPERGLYCEMIA 12/07/2006  . HYPERLIPIDEMIA 12/07/2006   mixed wth an atherogenic dyslipidemic pattern  . HYPERTENSION 12/07/2006  . Nephrolithiasis   . Nonalcoholic fatty liver disease   . Normal coronary arteries 04/06/2007   by cath EF 55%., last ECHO 10/13/10 EF>55% mild MR, last nuc 06/16/10 EF 57% low risk scan  . OBESITY 11/10/2009  . OSA treated with BiPAP 10/22/2007  . PAF (paroxysmal atrial fibrillation) ([place]) 04/09/2013  . Palpitations    tachy  . Pulmonary HTN (HCC)    RV syst.  40-15mmHg- moderate by echo  . Renal insufficiency    Cr to 3 on ACE  . Sleep apnea   . SOB (shortness of breath)    with exertion  . THROMBOCYTOPENIA 12/07/2006  . TRANSAMINASES, SERUM, ELEVATED 12/07/2006  . 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 Claiborne Billings   Past Surgical History:  Procedure Laterality Date  . A-FLUTTER ABLATION N/A 04/19/2017   Procedure: A-FLUTTER ABLATION;  Surgeon: Deboraha Sprang, MD;  Location: [place] [facility];  Service: Cardiovascular;  Laterality: N/A;  . CARDIAC CATHETERIZATION  04/06/2007   normal cors  . CARDIOVERSION N/A 08/05/2013   Procedure: CARDIOVERSION;  Surgeon: Troy Sine, MD;  Location: [facility];   Service: Cardiovascular;  Laterality: N/A;  . CARDIOVERSION N/A 12/26/2016   Procedure: CARDIOVERSION;  Surgeon: Troy Sine, MD;  Location: [facility];  Service: Cardiovascular;  Laterality: N/A;  . CHOLECYSTECTOMY    . CYSTOSCOPY/RETROGRADE/URETEROSCOPY  08/19/2011   Procedure: CYSTOSCOPY/RETROGRADE/URETEROSCOPY;  Surgeon: Hanley Ben, MD;  Location: [facility];  Service: Urology;  Laterality: Right;  JJ STENT PLACEMENT  . ROTATOR CUFF REPAIR Right    Allergies  Allergen Reactions  . Lisinopril Swelling and Other (See Comments)    Renal insufficiency also  . Dulaglutide Other (See Comments)  . Ibuprofen Other (See Comments)    Per the patient, he has "kidney and liver issues" and is NOT suppose to take this   Current Facility-Administered Medications on File Prior to Encounter  Medication Dose Route Frequency Provider Last Rate Last Admin  . 0.9 %  sodium chloride infusion  500 mL Intravenous Once Armbruster, Carlota Raspberry, MD       Current Outpatient Medications on File Prior to Encounter  Medication Sig Dispense Refill  . apixaban (ELIQUIS) 5 MG TABS tablet Take 1 tablet (5 mg total) by mouth 2 (two) times daily. 180 tablet 1  . atorvastatin (LIPITOR) 10 MG tablet Take 1 tablet (10 mg total) by mouth at bedtime. TAKE 1 TABLET BY MOUTH EVERYDAY AT BEDTIME 90 tablet 3  . calcitRIOL (ROCALTROL) 0.25 MCG capsule     .  carvedilol (COREG) 25 MG tablet TAKE 1 TABLET BY MOUTH TWICE A DAY WITH MEALS 180 tablet 2  . diltiazem (CARDIZEM CD) 240 MG 24 hr capsule Take 1 capsule (240 mg total) by mouth at bedtime. 90 capsule 3  . famotidine (PEPCID) 10 MG tablet Take 1 tablet by mouth daily.    . furosemide (LASIX) 40 MG tablet Take 1 tablet by mouth daily.    Marland Kitchen linagliptin (TRADJENTA) 5 MG TABS tablet Take 5 mg by mouth daily.    Marland Kitchen omega-3 acid ethyl esters (LOVAZA) 1 g capsule TAKE 2 CAPSULES BY MOUTH EVERY DAY NEEDS APPT FOR REFILLS 180 capsule 3  . potassium citrate  (UROCIT-K) 10 MEQ (1080 MG) SR tablet Take 10 mEq by mouth 2 (two) times daily.     . sildenafil (VIAGRA) 100 MG tablet Take 0.5-1 tablets (50-100 mg total) by mouth daily as needed for erectile dysfunction. 5 tablet 11   Social History   Socioeconomic History  . Marital status: Married    Spouse name: Not on file  . Number of children: 4  . Years of education: Not on file  . Highest education level: Not on file  Occupational History  . Occupation: retired Pharmacist, hospital  Tobacco Use  . Smoking status: Former Smoker    Years: 10.00    Types: Cigarettes    Quit date: 08/04/1980    Years since quitting: 40.2  . Smokeless tobacco: Former Systems developer    Types: Chew    Quit date: 08/08/2004  Vaping Use  . Vaping Use: Never used  Substance and Sexual Activity  . Alcohol use: Yes    Alcohol/week: 0.0 standard drinks    Comment: 1 beer 2-3 times per month  . Drug use: No  . Sexual activity: Yes  Other Topics Concern  . Not on file  Social History Narrative  . Not on file   Social Determinants of Health   Financial Resource Strain: Not on file  Food Insecurity: Not on file  Transportation Needs: Not on file  Physical Activity: Not on file  Stress: Not on file  Social Connections: Not on file  Intimate Partner Violence: Not on file   Family History  Problem Relation Age of Onset  . Diabetes Mother   . Heart failure Mother   . Stroke Mother   . Kidney failure Father   . Diabetes Father   . Cancer Paternal Uncle   . Colon cancer Neg Hx   . Esophageal cancer Neg Hx   . Stomach cancer Neg Hx   . Rectal cancer Neg Hx     OBJECTIVE:  Vitals:   10/24/20 0954 10/24/20 0955  BP: (!) 146/83   Pulse: 87   Resp: 16   Temp: 97.8 F (36.6 C)   TempSrc: Oral   SpO2: 97%   Weight:  245 lb (111.1 kg)    General appearance: alert; no distress HENT: normocephalic; atraumatic; dentition: fair; inflamed over right upper gums without areas of fluctuance Neck: supple without LAD Lungs: normal  respirations Skin: warm and dry Psychological: alert and cooperative; normal mood and affect  ASSESSMENT & PLAN:  1. Pain in gums   2. Gum abscess     Meds ordered this encounter  Medications  . amoxicillin-clavulanate (AUGMENTIN) 875-125 MG tablet    Sig: Take 1 tablet by mouth every 12 (twelve) hours for 10 days.    Dispense:  20 tablet    Refill:  0    Order Specific Question:  Supervising Provider    Answer:   Raylene Everts [[id number]]    Augmentin for infection Recommend soft diet until evaluated by dentist Maintain oral hygiene care Follow up with dentist as soon as possible for further evaluation and treatment  Return or go to the ED if you have any new or worsening symptoms such as fever, chills, difficulty swallowing, painful swallowing, oral or neck swelling, nausea, vomiting, chest pain, SOB, etc...  Reviewed expectations re: course of current medical issues. Questions answered. Outlined signs and symptoms indicating need for more acute intervention. Patient verbalized understanding. After Visit Summary given.   Lestine Box, PA-C 10/24/20 1036

## 2020-10-24 NOTE — ED Triage Notes (Signed)
Tooth pain for over a month, Thursday starting swelling.  Pt leaving for the beach today.

## 2020-11-09 DIAGNOSIS — I8311 Varicose veins of right lower extremity with inflammation: Secondary | ICD-10-CM | POA: Diagnosis not present

## 2020-11-09 DIAGNOSIS — L281 Prurigo nodularis: Secondary | ICD-10-CM | POA: Diagnosis not present

## 2020-11-09 DIAGNOSIS — I872 Venous insufficiency (chronic) (peripheral): Secondary | ICD-10-CM | POA: Diagnosis not present

## 2020-11-09 DIAGNOSIS — I8312 Varicose veins of left lower extremity with inflammation: Secondary | ICD-10-CM | POA: Diagnosis not present

## 2020-11-09 DIAGNOSIS — L570 Actinic keratosis: Secondary | ICD-10-CM | POA: Diagnosis not present

## 2020-11-09 DIAGNOSIS — D485 Neoplasm of uncertain behavior of skin: Secondary | ICD-10-CM | POA: Diagnosis not present

## 2020-11-09 DIAGNOSIS — L565 Disseminated superficial actinic porokeratosis (DSAP): Secondary | ICD-10-CM | POA: Diagnosis not present

## 2020-11-09 DIAGNOSIS — Z85828 Personal history of other malignant neoplasm of skin: Secondary | ICD-10-CM | POA: Diagnosis not present

## 2020-11-09 DIAGNOSIS — L578 Other skin changes due to chronic exposure to nonionizing radiation: Secondary | ICD-10-CM | POA: Diagnosis not present

## 2020-11-09 DIAGNOSIS — L720 Epidermal cyst: Secondary | ICD-10-CM | POA: Diagnosis not present

## 2020-11-09 DIAGNOSIS — L821 Other seborrheic keratosis: Secondary | ICD-10-CM | POA: Diagnosis not present

## 2020-11-11 ENCOUNTER — Ambulatory Visit: Payer: Medicare PPO | Admitting: Gastroenterology

## 2020-11-11 ENCOUNTER — Telehealth: Payer: Self-pay

## 2020-11-11 ENCOUNTER — Encounter: Payer: Self-pay | Admitting: Gastroenterology

## 2020-11-11 VITALS — BP 112/84 | HR 80 | Ht 71.0 in | Wt 249.8 lb

## 2020-11-11 DIAGNOSIS — Z7901 Long term (current) use of anticoagulants: Secondary | ICD-10-CM | POA: Diagnosis not present

## 2020-11-11 DIAGNOSIS — Z8639 Personal history of other endocrine, nutritional and metabolic disease: Secondary | ICD-10-CM

## 2020-11-11 DIAGNOSIS — Z01818 Encounter for other preprocedural examination: Secondary | ICD-10-CM | POA: Diagnosis not present

## 2020-11-11 DIAGNOSIS — Z86010 Personal history of colonic polyps: Secondary | ICD-10-CM | POA: Diagnosis not present

## 2020-11-11 DIAGNOSIS — K760 Fatty (change of) liver, not elsewhere classified: Secondary | ICD-10-CM

## 2020-11-11 NOTE — Telephone Encounter (Signed)
Left message to return call 

## 2020-11-11 NOTE — Patient Instructions (Signed)
If you are age 78 or older, your body mass index should be between 23-30. Your Body mass index is 34.84 kg/m. If this is out of the aforementioned range listed, please consider follow up with your Primary Care Provider.  If you are age 37 or younger, your body mass index should be between 19-25. Your Body mass index is 34.84 kg/m. If this is out of the aformentioned range listed, please consider follow up with your Primary Care Provider.   __________________________________________________________  The [facility] providers would like to encourage you to use [facility] to communicate with providers for non-urgent requests or questions.  Due to long hold times on the telephone, sending your provider a message by [facility] may be a faster and more efficient way to get a response.  Please allow 48 business hours for a response.  Please remember that this is for non-urgent requests.   You have been scheduled for a colonoscopy. Please follow written instructions given to you at your visit today.  Please pick up your prep supplies at the pharmacy within the next 1-3 days. If you use inhalers (even only as needed), please bring them with you on the day of your procedure.  You will be contacted by our office prior to your procedure for directions on holding your ELIQUIS.  If you do not hear from our office 1 week prior to your scheduled procedure, please call [phone] to discuss.   It was a pleasure to see you today!  Thank you for trusting me with your gastrointestinal care!

## 2020-11-11 NOTE — Telephone Encounter (Signed)
Patient with diagnosis of afib on Eliquis for anticoagulation.    Procedure: colonoscopy Date of procedure: 02/09/21  CHA2DS2-VASc Score = 5  This indicates a 7.2% annual risk of stroke. The patient's score is based upon: CHF History: No HTN History: Yes Diabetes History: Yes Stroke History: No Vascular Disease History: Yes Age Score: 2 Gender Score: 0  CrCl 65mL/min using adjusted body weight due to obesity Platelet count 113K  Per office protocol, patient can hold Eliquis for 2 days prior to procedure.

## 2020-11-11 NOTE — Telephone Encounter (Signed)
    Martin Cordova DOB:  [birth year]  MRN:  [id number]   Primary Cardiologist: Shelva Majestic, MD  Chart reviewed as part of pre-operative protocol coverage. Given past medical history and time since last visit, based on ACC/AHA guidelines, Bane Hagy Awad would be at acceptable risk for the planned procedure without further cardiovascular testing.   Patient with diagnosis of afib on Eliquis for anticoagulation.    Procedure: colonoscopy Date of procedure: 02/09/21  CHA2DS2-VASc Score = 5  This indicates a 7.2% annual risk of stroke. The patient's score is based upon: CHF History: No HTN History: Yes Diabetes History: Yes Stroke History: No Vascular Disease History: Yes Age Score: 2 Gender Score: 0  CrCl 56mL/min using adjusted body weight due to obesity Platelet count 113K  Per office protocol, patient can hold Eliquis for 2 days prior to procedure.    I will route this recommendation to the requesting party via Epic fax function and remove from pre-op pool.  Please call with questions.  Kathyrn Drown, NP 11/11/2020, 4:01 PM

## 2020-11-11 NOTE — Progress Notes (Signed)
HPI :  80 year old male here to reestablish care with [place] for history of colon polyps, fatty liver.  Recall he has  a history of atrial fibrillation / atrial flutter on Eliquis, history of fatty liver.  He has not been seen since January 2019.  During his colonoscopy in 2019 he had 5 polyps removed, most adenomas, off 3-year follow-up colonoscopy was recommended at that time.  He denies any problems with his bowels, no blood in his stools.  He denies any abdominal pains.  He has paroxysmal A. fib, no issues recently with this, he denies any cardiopulmonary complaints.  He denies any family history of colon cancer.  If he does have a colonoscopy he is requesting MiraLAX bowel prep as he had some issues with the last preparation he took.  He does have chronic kidney disease with baseline GFR in the 20s.  He does have a history of fatty liver, has had steatosis noted historically on ultrasound.  His liver enzymes have remained normal.  He had a CT scan abdomen pelvis in 2018 which showed mild hepatic steatosis and no evidence of cirrhosis.  He has chronic thrombocytopenia dating back years.  Last LFTs done December 2021 and were normal.    Colonoscopy - 01/23/2014 - 3 small polyps removed, TAs   Colonoscopy 06/20/2017 - The perianal and digital rectal examinations were normal. - Two sessile polyps were found in the descending colon. The polyps were 3 to 5 mm in size. These polyps were removed with a cold snare. Resection and retrieval were complete. - Three sessile polyps were found in the sigmoid colon. The polyps were 3 to 5 mm in size. These polyps were removed with a cold snare. Resection and retrieval were complete. - Many small and large-mouthed diverticula were found in the entire colon, highest burden in the sigmoid colon (severe diverticulosis), mild in the right and transverse - Internal hemorrhoids were found during retroflexion. - The exam was otherwise without abnormality.  Surgical  [P], descending, sigmoid, polyp (5) - TUBULAR ADENOMA (X5 FRAGMENTS). - HYPERPLASTIC POLYP. - NO HIGH GRADE DYSPLASIA OR MALIGNANCY.   Echo 01/25/19 - EF 60-65%,    Past Medical History:  Diagnosis Date  . Colon polyps   . Diabetes mellitus without complication ([place])   . Diverticulitis   . Diverticulosis   . GERD 12/07/2006  . HYPERGLYCEMIA 12/07/2006  . HYPERLIPIDEMIA 12/07/2006   mixed wth an atherogenic dyslipidemic pattern  . HYPERTENSION 12/07/2006  . Nephrolithiasis   . Nonalcoholic fatty liver disease   . Normal coronary arteries 04/06/2007   by cath EF 55%., last ECHO 10/13/10 EF>55% mild MR, last nuc 06/16/10 EF 57% low risk scan  . OBESITY 11/10/2009  . OSA treated with BiPAP 10/22/2007  . PAF (paroxysmal atrial fibrillation) ([place]) 04/09/2013  . Palpitations    tachy  . Pulmonary HTN (HCC)    RV syst.  40-43mmHg- moderate by echo  . Renal insufficiency    Cr to 3 on ACE  . Sleep apnea   . SOB (shortness of breath)    with exertion  . THROMBOCYTOPENIA 12/07/2006  . TRANSAMINASES, SERUM, ELEVATED 12/07/2006  . 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 Claiborne Billings     Past Surgical History:  Procedure Laterality Date  . A-FLUTTER ABLATION N/A 04/19/2017   Procedure: A-FLUTTER ABLATION;  Surgeon: Deboraha Sprang, MD;  Location: [place] [facility];  Service: Cardiovascular;  Laterality: N/A;  . CARDIAC CATHETERIZATION  04/06/2007   normal cors  .  CARDIOVERSION N/A 08/05/2013   Procedure: CARDIOVERSION;  Surgeon: Troy Sine, MD;  Location: [place];  Service: Cardiovascular;  Laterality: N/A;  . CARDIOVERSION N/A 12/26/2016   Procedure: CARDIOVERSION;  Surgeon: Troy Sine, MD;  Location: [facility]OSCOPY;  Service: Cardiovascular;  Laterality: N/A;  . CHOLECYSTECTOMY    . CYSTOSCOPY/RETROGRADE/URETEROSCOPY  08/19/2011   Procedure: CYSTOSCOPY/RETROGRADE/URETEROSCOPY;  Surgeon: Hanley Ben, MD;  Location: [facility];  Service: Urology;  Laterality:  Right;  JJ STENT PLACEMENT  . ROTATOR CUFF REPAIR Right    Family History  Problem Relation Age of Onset  . Diabetes Mother   . Heart failure Mother   . Stroke Mother   . Kidney failure Father   . Diabetes Father   . Cancer Paternal Uncle   . Colon cancer Neg Hx   . Esophageal cancer Neg Hx   . Stomach cancer Neg Hx   . Rectal cancer Neg Hx    Social History   Tobacco Use  . Smoking status: Former Smoker    Years: 10.00    Types: Cigarettes    Quit date: 08/04/1980    Years since quitting: 40.2  . Smokeless tobacco: Former Systems developer    Types: Chew    Quit date: 08/08/2004  Vaping Use  . Vaping Use: Never used  Substance Use Topics  . Alcohol use: Not Currently  . Drug use: No   Current Outpatient Medications  Medication Sig Dispense Refill  . apixaban (ELIQUIS) 5 MG TABS tablet Take 1 tablet (5 mg total) by mouth 2 (two) times daily. 180 tablet 1  . atorvastatin (LIPITOR) 10 MG tablet Take 1 tablet (10 mg total) by mouth at bedtime. TAKE 1 TABLET BY MOUTH EVERYDAY AT BEDTIME 90 tablet 3  . carvedilol (COREG) 25 MG tablet TAKE 1 TABLET BY MOUTH TWICE A DAY WITH MEALS 180 tablet 2  . diltiazem (CARDIZEM CD) 240 MG 24 hr capsule Take 1 capsule (240 mg total) by mouth at bedtime. 90 capsule 3  . famotidine (PEPCID) 10 MG tablet Take 1 tablet by mouth daily.    . furosemide (LASIX) 40 MG tablet Take 1 tablet by mouth daily.    Marland Kitchen linagliptin (TRADJENTA) 5 MG TABS tablet Take 5 mg by mouth daily.    Marland Kitchen omega-3 acid ethyl esters (LOVAZA) 1 g capsule TAKE 2 CAPSULES BY MOUTH EVERY DAY NEEDS APPT FOR REFILLS 180 capsule 3  . potassium citrate (UROCIT-K) 10 MEQ (1080 MG) SR tablet Take 10 mEq by mouth 2 (two) times daily.     . sildenafil (VIAGRA) 100 MG tablet Take 0.5-1 tablets (50-100 mg total) by mouth daily as needed for erectile dysfunction. 5 tablet 11   Current Facility-Administered Medications  Medication Dose Route Frequency Provider Last Rate Last Admin  . 0.9 %  sodium chloride  infusion  500 mL Intravenous Once Siham Bucaro, Carlota Raspberry, MD       Allergies  Allergen Reactions  . Lisinopril Swelling and Other (See Comments)    Renal insufficiency also  . Dulaglutide Other (See Comments)  . Ibuprofen Other (See Comments)    Per the patient, he has "kidney and liver issues" and is NOT suppose to take this     Review of Systems: All systems reviewed and negative except where noted in HPI.   Lab Results  Component Value Date   WBC 5.1 05/15/2020   HGB 15.0 05/15/2020   HCT 45.3 05/15/2020   MCV 92.1 05/15/2020   PLT 113 (L) 05/15/2020  Lab Results  Component Value Date   CREATININE 2.57 (H) 05/15/2020   BUN 36 (H) 05/15/2020   NA 141 05/15/2020   K 4.3 05/15/2020   CL 105 05/15/2020   CO2 27 05/15/2020    Lab Results  Component Value Date   ALT 32 05/15/2020   AST 20 05/15/2020   ALKPHOS 41 05/15/2020   BILITOT 0.9 05/15/2020     Physical Exam: BP 112/84   Pulse 80   Ht 5\' 11"  (1.803 m)   Wt 249 lb 12.8 oz (113.3 kg)   SpO2 97%   BMI 34.84 kg/m  Constitutional: Pleasant,well-developed, male in no acute distress. HEENT: Normocephalic and atraumatic. Conjunctivae are normal. No scleral icterus. Neck supple.  Cardiovascular: Normal rate, regular rhythm. HR initially in 100s upon arrival, on exam it was down to 80s. Pulmonary/chest: Effort normal and breath sounds normal.  Abdominal: Soft, nondistended, protuberant,, nontender.  There are no masses palpable. Extremities: no edema Lymphadenopathy: No cervical adenopathy noted. Neurological: Alert and oriented to person place and time. Skin: Skin is warm and dry. No rashes noted. Psychiatric: Normal mood and affect. Behavior is normal.   ASSESSMENT AND PLAN: 80 year old male here for reassessment of the following:  History of colon polyps Anticoagulated Fatty liver  The patient is due for surveillance colonoscopy given his history of colon polyps in 2019.  We discussed if he wanted  to have any further colonoscopy exams at his age with his comorbidities.  I discussed colonoscopy with him, risk benefits of the exam and that of anesthesia.  He is otherwise feeling well and wishes to have 1 more surveillance exam prior to stopping, as he has had a history of multiple exams with polyps in the past.  To reduce his bleeding risk given his GFR in the 20s will see if he can hold his Eliquis for 4 days prior to the procedure, will ask his providing provider.  He is requesting MiraLAX bowel prep specifically which is okay, I asked him to drink more solution a.m. of procedure if not cleared after standard ingestion.  If no significant findings on this colonoscopy it will be his last from a surveillance perspective given his age.  Otherwise I reviewed his history of fatty liver.  His liver enzymes are normal.  He does have baseline thrombocytopenia but imaging over the years has shown no evidence of cirrhosis and I think unrelated.  He should have his LFTs checked yearly.  Counseled on fatty liver  Plan: - colonoscopy at [facility] - hold Eliquis for 4 days prior to procedure given GFR - counseled on fatty liver, monitor LFTs  Pace Cellar, MD [facility]  CC: Susy Frizzle, MD

## 2020-11-11 NOTE — Telephone Encounter (Signed)
[facility] Pre-operative Risk Assessment     Request for surgical clearance:     Endoscopy Procedure  What type of surgery is being performed?     Colonoscopy   When is this surgery scheduled?     02-09-2021  What type of clearance is required ?   Pharmacy  Are there any medications that need to be held prior to surgery and how long? Yes, Eliquis   Practice name and name of physician performing surgery?      [facility]  What is your office phone and fax number?      Phone- [phone]  [phone]  Anesthesia type (None, local, MAC, general) ?       MAC

## 2020-11-12 ENCOUNTER — Telehealth: Payer: Self-pay | Admitting: Hematology

## 2020-11-12 NOTE — Telephone Encounter (Signed)
Cancelled appointment per 06/09 sch msg. Left message with patient patient will call back if needs to rescheduled.

## 2020-11-12 NOTE — Telephone Encounter (Signed)
Pt has been notified and aware. He states clear understanding.

## 2020-11-12 NOTE — Telephone Encounter (Signed)
Inbound call from pt returning your phone call. 

## 2020-11-17 ENCOUNTER — Other Ambulatory Visit: Payer: Self-pay | Admitting: Cardiovascular Disease

## 2020-12-04 ENCOUNTER — Other Ambulatory Visit: Payer: Self-pay

## 2020-12-04 ENCOUNTER — Ambulatory Visit: Payer: Medicare PPO | Admitting: Cardiovascular Disease

## 2020-12-04 ENCOUNTER — Encounter: Payer: Self-pay | Admitting: Cardiovascular Disease

## 2020-12-04 DIAGNOSIS — I10 Essential (primary) hypertension: Secondary | ICD-10-CM

## 2020-12-04 DIAGNOSIS — I4891 Unspecified atrial fibrillation: Secondary | ICD-10-CM

## 2020-12-04 DIAGNOSIS — G4733 Obstructive sleep apnea (adult) (pediatric): Secondary | ICD-10-CM | POA: Diagnosis not present

## 2020-12-04 DIAGNOSIS — Z79899 Other long term (current) drug therapy: Secondary | ICD-10-CM | POA: Diagnosis not present

## 2020-12-04 DIAGNOSIS — E785 Hyperlipidemia, unspecified: Secondary | ICD-10-CM | POA: Diagnosis not present

## 2020-12-04 DIAGNOSIS — Z7901 Long term (current) use of anticoagulants: Secondary | ICD-10-CM

## 2020-12-04 DIAGNOSIS — I480 Paroxysmal atrial fibrillation: Secondary | ICD-10-CM | POA: Diagnosis not present

## 2020-12-04 DIAGNOSIS — Z5181 Encounter for therapeutic drug level monitoring: Secondary | ICD-10-CM

## 2020-12-04 DIAGNOSIS — R600 Localized edema: Secondary | ICD-10-CM

## 2020-12-04 LAB — COMPREHENSIVE METABOLIC PANEL WITH GFR
ALT: 43 [IU]/L (ref 0–44)
AST: 21 [IU]/L (ref 0–40)
Albumin/Globulin Ratio: 1.8 (ref 1.2–2.2)
Albumin: 4.6 g/dL (ref 3.7–4.7)
Alkaline Phosphatase: 51 [IU]/L (ref 44–121)
BUN/Creatinine Ratio: 17 (ref 10–24)
BUN: 39 mg/dL — ABNORMAL HIGH (ref 8–27)
Bilirubin Total: 0.8 mg/dL (ref 0.0–1.2)
CO2: 24 mmol/L (ref 20–29)
Calcium: 10.5 mg/dL — ABNORMAL HIGH (ref 8.6–10.2)
Chloride: 101 mmol/L (ref 96–106)
Creatinine, Ser: 2.24 mg/dL — ABNORMAL HIGH (ref 0.76–1.27)
Globulin, Total: 2.5 g/dL (ref 1.5–4.5)
Glucose: 119 mg/dL — ABNORMAL HIGH (ref 65–99)
Potassium: 4.8 mmol/L (ref 3.5–5.2)
Sodium: 142 mmol/L (ref 134–144)
Total Protein: 7.1 g/dL (ref 6.0–8.5)
eGFR: 29 mL/min/{1.73_m2} — ABNORMAL LOW

## 2020-12-04 LAB — CBC
Hematocrit: 46.6 % (ref 37.5–51.0)
Hemoglobin: 15.5 g/dL (ref 13.0–17.7)
MCH: 30.1 pg (ref 26.6–33.0)
MCHC: 33.3 g/dL (ref 31.5–35.7)
MCV: 91 fL (ref 79–97)
Platelets: 128 10*3/uL — ABNORMAL LOW (ref 150–450)
RBC: 5.15 x10E6/uL (ref 4.14–5.80)
RDW: 13.3 % (ref 11.6–15.4)
WBC: 5.6 10*3/uL (ref 3.4–10.8)

## 2020-12-04 LAB — TSH: TSH: 2.35 u[IU]/mL (ref 0.450–4.500)

## 2020-12-04 MED ORDER — AMIODARONE HCL 200 MG PO TABS: 200.0000 mg | ORAL_TABLET | Freq: Two times a day (BID) | ORAL | 3 refills | Status: DC

## 2020-12-04 NOTE — Progress Notes (Signed)
Patient ID: Martin Cordova, male   DOB: Jan 28, 1941, 80 y.o.   MRN: [id number]      HPI: Martin Cordova is a 80 y.o. male is who presents to the office today for an 8 month follow-up cardiology/sleep clinic evaluation.  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 In the past, he developed an obstructive uropathy attributed to kidney stones; creatinine had risen up to 3 and ultimately improved and stabilized at approximately 1.7. Martin Cordova uses his BiPAP daily and does note good sleep.  In the past he had issues with mild peripheral edema, which ultimately improved.  He had been previously taken off his lisinopril when his creatinine had risen in the setting of his obstructive uropathy.  In 2015 he developed recurrent episodes of palpitations and was found to have recurrent paroxysmal atrial fibrillation/flutter.  His medications were adjusted and  he was started on antiarrhythmic therapy with Rythmol to take in addition to his increasing doses of carvedilol.  He was started on Eliquis for anticoagulation.  He underwent successful cardioversion on 08/05/2013.  Since that time, he is unaware of any recurrent atrial fibrillation.  He has noticed more energy. He had been maintained on 50 mg twice a day of carvedilol in addition to Rythmol 225 mg every 8 hours, but due to slow pulse rates, these doses have ultimately been reduced.  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 He has been on CPAP therapy initially for approximately 8 years and has been on BiPAP auto SV for over 4 years. He admits to using his BiPAP with 100% compliance.  He obtained a new BiPAP machine in June.  He feels that his machine is  significantly improved from his prior one. When I last saw him I reviewed his  download of his new BiPAP auto SV machine indicates that 90% of the time is EPAP pressure was 17 and 90% of the time his pressure support was 5.8, giving an IPAP of 21.8 cm.  He is using it 100% of the time and is averaging 5 hours and 49 minutes on his most recent download from 12/07/2013 through 01/05/2014.  His average AHI was 7.5.  His device setting maximum BiPAP pressure is 25 cm in maximum EPAP pressure 21 cm.  His average central event index is 1.3, and average obstructive apneic index 0.2, with an average copy index of 6.0.  Average breath.  Rate is 18 breaths per minute with a minute ventilation of 12.1.  Average total body may 670 mL.  When I saw him on 10/21/15 he was unaware of any arrhythmia.  He was on carvedilol 12.5 mg twice a day, Rythmol 225 mg twice a day, hydralazine 25 mg twice a day and eliquis  5 mg twice a day.  He also has been taking omeprazole for GERD and atorvastatin 10 mg for hyperlipidemia. He was found to have recurrent atrial flutter with variable block.  At that time, I recommended that he increase his Rythmol and take 225 mg every 8 hours and further increased his carvedilol to 18.75 mg twice a day.  Laboratory had revealed a magnesium of 2.0.  He has stage III chronic kidney disease  and his BUN was 26, creatinine 1.71 with a GFR estimate of 39.  Potassium was 4.8.  TSH was normal at 2.4.  Insomnia.  One week later in follow-up he had reverted back to sinus rhythm..  When seen in September 2017, he was maintaining sinus rhythm.  Since then, he admits to weight gain.  In early April, he began to notice some increasing shortness of breath and elevated heart rate and fatigue.  He was seen by Martin Cordova on 09/07/2016 and was found to be back in atrial flutter with a ventricular rate at 90.  He has been on eloquence 5 g twice a day for anticoagulation and was on carvedilol 18.75 twice a day.  Carvedilol  dose was increased to 25 mg twice a day.  He was also told at that time to try increasing his Propofenone to 300 mg 3 times a day.  He took this for several days but did not tolerate the increased dose and reduced it back to its previous dose of 225 mg every 8 hours.  When I saw him on 10/20/2016  I increased carvedilol to 37.5 mg twice a day.  He has continued to use his BiPAP therapy for sleep apnea.    When I saw him in June 2018 he was still in atrial flutter with 3:1 block.  I discontinued hydralazine and started him on Cardizem CD 180 mg, both for blood pressure and potential antiarrhythmic therapy. He also was having ankle edema and I started him on low-dose hydrochlorothiazide.  His peripheral edema significantly improved.  He has felt better.  He denied episodes of chest tightness.  He underwent successful outpatient DC cardioversion by me on 12/26/2016.  He successfully cardioverted at 120 joules.  When he returned for a follow-up evaluation on 01/12/2017 and saw  Martin Cordova, PAC .  He was back in atrial flutter.  At that time his propafenone was discontinued.  Over the past several months, Martin Cordova has felt well.  At times there is some trace swelling in his ankles.  He continues to use his BiPAP with 100% compliance.  He was hospitalized on September 19 with an episode of diverticulitis and was discharged on 02/24/2017.   When I saw him in October 2018, he was back in atrial flutter.  At that time, I recommended consideration for atrial flutter ablation.  He was evaluated by Martin Cordova and on 04/19/2017 underwent successful atrial flutter ablation across the caval tricuspid isthmus, which interrupted bidirectional conduction.  Initial plan was for him to continue anticoagulation for 4 weeks, but unfortunately he developed irregular tachycardia palpitations about 48 hours after the procedure.  He's been found to have paroxysmal atrial fibrillation.  He as result is on chronic anticoagulation  therapy.  In November 2018 an echo Doppler study showed an EF of 40-45%.  He has been on carvedilol 25 mg twice a day and takes an extra carvedilol as needed.  He has had recurrent short burst of atrial fibrillation, but had an episode for almost an entire day.  He received a new BiPAP machine after his previous machine completely malfunction.  He admits to 100% compliance.  [facility] is his DME company.  He has diabetes mellitus and was started back on Tradjenta for hemoglobin A1c of 7.    He has a [employer] BiPAP auto SV unit. I obtained a download from September 02, 2017 through October 01, 2017.  He is compliant.  His average CPAP pressure   was 16.8 and average pressure support pressure was 4.1.  90% of the time device EPAP was 17 cm.  AHI, however, was still mildly increased at 8.9.  He is unaware of breakthrough snoring.  He denies restless legs.  He denies chest pain.  He has noticed some mild episodes of dizziness in the morning.    I recommended some changes to his BiPAP therapy and change his ramp time to 5 minutes, increased but pressure support to 5.5 increased his EPAP to 9 cm water pressure.  A download was obtained from June 12 through December 14, 2017.  He is 100% compliant and averaging his hours and 24 minutes of CPAP use per night.  His average AHI is still slightly elevated 8.2.  90% of time device CPAP was 17 cm water pressure.  Though his pressure support range can from 0 to 5.5 cm, he apparently has only been needing a pressure support of 2.4 cm of water.  He feels well.  He has a F&P medium cushion full facemask but he is an oral breather and when he opens his mouth the facemask is actually too small which may be contributing to some of his increased AHI.  He presents to sleep clinic for further evaluation. He had been evaluated by Martin Cordova for his renal issues.  He denies any awareness of recurrent atrial flutter.  He has seen Martin Cordova here he also has been  diagnosed with monoclonal gammopathy of undetermined significance and is monitored by Martin Cordova.  He had undergone a cardiac MRI which was ordered by Martin Cordova last year in February 2019 which showed normal LV size and function with an EF of 54%.  There was no delayed gadolinium uptake on inversion recovery sequences.  He had a trileaflet aortic valve with mild AR, mild MR, moderate RV enlargement, moderate biatrial enlargement and there was moderate LVH with the mid and basal septum measuring 14 mm compared to the posterior wall at 9 mm.  I last saw him in February 2020 at which time he denied any recurrent chest pain or abnormal heart rhythm.  He was using his BiPAP auto SV with 100% compliance.  A download was obtained in the office from January 26 through July 30, 2018.  He is 100% compliant.  He requires high pressures and his average EPAP pressure was 17 which also was his 90% pressure.  He is set up to a potential maximum IPAP pressure of 30.  His pressure support ranges from 0-5.5 with an average pressure support of 2.4 cm.  He is now using a large mask which is much more comfortable and is he is doing better.  On his most recent download, his AHI was still mildly increased at 7.1/h.  He has undergone follow-up hematologic evaluation with Martin Cordova for his monoclonal gammopathy.    I last saw him on April 05, 2019.  Since his prior evaluation he felt that he may have had 3 brief short-lived episodes of PAF which resolved spontaneously.  He denied any associated chest pain. He underwent Mohs surgery for a skin cancer on his chin.  He was unable to use BiPAP for several days since this is where his mask would rest.  I obtained a download from July 1 through April 04, 2019.  His minimum EPAP pressure was 17 with a maximum IPAP pressure of 30 and pressure support of 5.5 cm.  Apparently his ramp start pressure is 9.5 cm.  Average AHI is 6.9.    90% of the time his device EPAP pressure was 17.  Average  breath rate was 16.1 breaths/min.   Echo Doppler study on January 25, 2019 showed an EF of 60 to 65% with mild LVH.  Normal diastolic parameters.  There was moderate aortic sclerosis without stenosis and mild AR.  I last saw him on Oct 08, 2019 at which time he felt well.  He is followed by nephrology with stage IV chronic kidney disease with recent creatinine in February 2021 at 2.59.  He was unaware of any recurrent atrial fibrillation or flutter.  He continues to use BiPAP auto SV therapy with 100% compliance.  He is followed by Martin Cordova for his diabetes mellitus.   I last saw him in November 2021 at which time he remained stable.  He is now followed by Dr. Baird Cancer with the imminent retirement of Dr. Baird Cancer.  Laboratory from April 03, 2020 did show improvement in his creatinine which typically range between 2 and 2.5 and was 1.99.  He denies any significant shortness of breath.  At times he has noticed.  Short brief episodes of possible PAF which self resolved.  His medical regimen has included carvedilol 25 mg twice a day, diltiazem 240 mg daily, furosemide 40 mg for blood pressure and heart rate control.  He is diabetic on Tradjenta.  He has been on Eliquis without bleeding for anticoagulation and continues to be on low-dose atorvastatin and lovaza for hyperlipidemia.  He has a [employer] BiPAP auto SV machine and I discussed with him the recall with a very small risk of Styrofoam breakdown contributed by possible ozone related cleaning cleansing agents.  He had contacted [employer] and is on a list for a new machine.    Since I last saw him, over the last several months he has noticed some increased fatigability.  He also has noticed some mild leg swelling right greater than left.  He denies any chest pain.  He has continued to use his BiPAP ASV.  I obtained a download in the office today from June 1 through December 03, 2020 which shows 100% use.  AHI was 5.8 and is 90% EPAP was 17 and 90% pressure  support was 3.3 cm.  He denies any chest pain.  He is unaware of rapid palpitations.  He presents for evaluation.    Past Medical History  Diagnosis Date   GERD 12/07/2006   HYPERGLYCEMIA 12/07/2006   HYPERLIPIDEMIA 12/07/2006    mixed wth an atherogenic dyslipidemic pattern   HYPERTENSION 12/07/2006   OBESITY 11/10/2009   THROMBOCYTOPENIA 12/07/2006   TRANSAMINASES, SERUM, ELEVATED 12/07/2006   SLEEP APNEA 10/22/2007    uses bipap   VENTRICULAR TACHYCARDIA 03/27/2007    monitored by Martin Cordova   Diabetes mellitus without complication    Renal insufficiency     Cr to 3 on ACE   Nephrolithiasis    Normal coronary arteries 04/06/2007    by cath EF 55%., last ECHO 10/13/10 EF>55% mild MR, last nuc 06/16/10 EF 57% low risk scan   Pulmonary HTN     RV syst.  40-4mHg- moderate by echo   Palpitations     tachy   PAF (paroxysmal atrial fibrillation) 04/09/2013    Past Surgical History  Procedure Laterality Date   Cholecystectomy     Rotator cuff repair     Cystoscopy/retrograde/ureteroscopy  08/19/2011    Procedure: CYSTOSCOPY/RETROGRADE/URETEROSCOPY;  Surgeon: MHanley Ben MD;  Location[facility]  Service: Urology;  Laterality: Right;  JJ STENT PLACEMENT    Cardiac catheterization  04/06/2007    normal cors    Allergies  Allergen Reactions   Lisinopril     Renal insufficiency    Current Outpatient Medications:    amiodarone (PACERONE) 200 MG tablet, Take 1 tablet (200 mg total) by mouth 2 (two) times daily., Disp: 180 tablet, Rfl: 3   apixaban (ELIQUIS) 5 MG TABS tablet, Take 1 tablet (5 mg total) by mouth 2 (two) times daily., Disp: 180 tablet, Rfl: 1   atorvastatin (LIPITOR) 10 MG tablet, TAKE 1 TABLET BY MOUTH EVERYDAY AT BEDTIME, Disp: 90 tablet, Rfl: 3   carvedilol (COREG) 25 MG tablet, TAKE 1 TABLET BY MOUTH TWICE A DAY WITH MEALS, Disp: 180 tablet, Rfl: 2   diltiazem (CARDIZEM CD) 240 MG 24 hr capsule, TAKE 1 CAPSULE (240 MG TOTAL) BY MOUTH AT BEDTIME., Disp:  90 capsule, Rfl: 3   famotidine (PEPCID) 10 MG tablet, Take 1 tablet by mouth daily., Disp: , Rfl:    fluoruracil (FLUOROPLEX) 1 % cream, Apply topically 2 (two) times daily., Disp: , Rfl:    furosemide (LASIX) 40 MG tablet, Take 1 tablet by mouth daily., Disp: , Rfl:    linagliptin (TRADJENTA) 5 MG TABS tablet, Take 5 mg by mouth daily., Disp: , Rfl:    omega-3 acid ethyl esters (LOVAZA) 1 g capsule, TAKE 2 CAPSULES BY MOUTH EVERY DAY, Disp: 180 capsule, Rfl: 3   potassium citrate (UROCIT-K) 10 MEQ (1080 MG) SR tablet, Take 10 mEq by mouth 2 (two) times daily. , Disp: , Rfl:    sildenafil (VIAGRA) 100 MG tablet, Take 0.5-1 tablets (50-100 mg total) by mouth daily as needed for erectile dysfunction., Disp: 5 tablet, Rfl: 11  Current Facility-Administered Medications:    0.9 %  sodium chloride infusion, 500 mL, Intravenous, Once, [place]bruster, Carlota Raspberry, MD  Socially he is married has 4 children and 5 grandchildren. He is a distant relative to the "[place] brothers." He does try to walk and exercise. There is no tobacco use. He does drink occasional alcohol.   ROS General: Negative; No fevers, chills, or night sweats;  HEENT: Negative; No changes in vision or hearing, sinus congestion, difficulty swallowing Pulmonary: Negative; No cough, wheezing, shortness of breath, hemoptysis Cardiovascular: See history of present illness No recent peripheral edema GI: Status post recent episode of diverticulitis requiring 2 day hospitalization GU:  No dysuria, hematuria, or difficulty voiding; some difficulty with erectile function Musculoskeletal: Negative; no myalgias, joint pain, or weakness Hematologic/Oncology: Positive M spike with monoclonal gammopathy of undetermined significance; history of thrombocytopenia Endocrine: Negative; no heat/cold intolerance; no diabetes Neuro: Negative; no changes in balance, headaches Skin: Negative; No rashes or skin lesions Psychiatric: Negative; No behavioral  problems, depression Sleep: He is using his BiPAP Auto SV therapy with 100% compliance.  No snoring, daytime sleepiness, hypersomnolence, bruxism, restless legs, hypnogognic hallucinations, no cataplexy Other comprehensive 14 point system review is negative.  PE BP (!) 142/82   Pulse 79   Ht _0  (1.803 m)   Wt 249 lb 9.6 oz (113.2 kg)   SpO2 96%   BMI 34.81 kg/m    Repeat blood pressure by me was 130/78  Wt Readings from Last 3 Encounters:  12/04/20 249 lb 9.6 oz (113.2 kg)  11/11/20 249 lb 12.8 oz (113.3 kg)  10/24/20 245 lb (111.1 kg)   General: Alert, oriented, no distress.  Skin: normal turgor, no rashes, warm and dry HEENT: Normocephalic, atraumatic. Pupils equal round and  reactive to light; sclera anicteric; extraocular muscles intact;  Nose without nasal septal hypertrophy Mouth/Parynx benign; Mallinpatti scale 3 Neck: No JVD, no carotid bruits; normal carotid upstroke Lungs: clear to ausculatation and percussion; no wheezing or rales Chest wall: without tenderness to palpitation Heart: PMI not displaced, irregularly irregular consistent with atrial fibrillation with controlled rate in the upper 70s, s1 s2 normal, 1/6 systolic murmur, no diastolic murmur, no rubs, gallops, thrills, or heaves Abdomen: soft, nontender; no hepatosplenomehaly, BS+; abdominal aorta nontender and not dilated by palpation. Back: no CVA tenderness Pulses 2+ Musculoskeletal: full range of motion, normal strength, no joint deformities Extremities: 1+ pitting edema right lower extremity greater than left no clubbing cyanosis, Homan's sign negative  Neurologic: grossly nonfocal; Cranial nerves grossly wnl Psychologic: Normal mood and affect    ECG (independently read by me): Atrial fibrillation at 79, RBBB, PVC  May 04, 2020 ECG (independently read by me): Sinus bradycardia at 56, PVC, RBBB  May 4, 2021ECG (independently read by me): Normal sinus rhythm at 63 bpm with premature complex.   Right bundle branch block with repolarization changes.  Normal intervals.  April 05, 2019 ECG (independently read by me): Sinus Bradycardia ay 55: RBBB with repolarization.  February 25,2020 ECG (independently read by me): Sinus bradycardia at 56 bpm.  Right bundle branch block with repolarization changes.  October 03, 2017 ECG (independently read by me):  Sinus bradycardia at 50 bpm.  Right bundle branch block with repolarization changes. QTc interval 461 ms.  January 2019 ECG (independently read by me): Sinus bradycardia 56 bpm.  Possible left atrial enlargement.  Right bundle branch block with repolarization changes.  PR interval 186 ms; QTc interval 436 ms  October 2018 ECG (independently read by me): Atrial flutter with variable block at 96 bpm.  Right bundle-branch block with repolarization changes.  QTc interval 437 ms  July 2018 ECG (independently read by me): Probable atrial flutter with a ventricular rate at 82 bpm.  Right bundle-branch block, left anterior hemiblock.  11/08/2016 ECG (independently read by me): Probable atrial flutter 3:1 block , ventricular rate at 85;  right bundle branch block with repolarization changes.  QTc interval 528 ms.   May 2018 ECG (independently read by me): atrial flutter with variable block with ventricular rate at 89 bpm.  Right bundle-branch block, left anterior hemiblock.  September 2017 ECG (independently read by me): Normal sinus rhythm at 63 bpm.  First-degree AV block with a PR interval of 208 ms.  Right bundle branch block with repolarization changes.  QTc interval 466 ms.  10/28/15 ECG (independently read by me): Sinus bradycardia at 54 bpm.  First degree block with PR interval 222 ms.  Right bundle-branch block.  10/21/2015 ECG (independently read by me): Atrial flutter with variable block.  Right bundle-branch block with repolarization changes.  October 2016 ECG (independently read by me): sinus rhythm with first-degree AV block with a PR  interval at 216 ms.  Ventricular rate 86 bpm with occasional PVCs.    May 2016 ECG (independently read by me): Normal sinus rhythm at 60 bpm.  Right bundle-branch block with repolarization changes.  November 2015 ECG (independently read by me): Normal sinus rhythm at 61.  Nonspecific T abnormality.  QTc interval 394 ms.  11/18/2013 ECG (independently read by me): Sinus bradycardia 58 beats per minute.  PR interval 198 ms; QTc interval 371 ms.  Nonspecific ST changes.  07/29/2013 ECG  (independently read by me): Atrial flutter with 2:1 block with a ventricular  rate of 87 beats per minute. Atrial rate is approximately 360 ms. 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 fib flutter now with right bundle branch block and repolarization changes.  Prior ECG of 06/04/2013: EKG  suggests probable atrial flutter with a ventricular rate of 105 beats per minute. There also are frequent PVCs. QTc interval is 436 ms. They're nonspecific T changes.  LABS: BMP Latest Ref Rng & Units 12/04/2020 05/15/2020 12/20/2019  Glucose 65 - 99 mg/dL 119(H) 156(H) 125(H)  BUN 8 - 27 mg/dL 39(H) 36(H) 34(H)  Creatinine 0.76 - 1.27 mg/dL 2.24(H) 2.57(H) 2.24(H)  BUN/Creat Ratio 10 - 24 17 - 15  Sodium 134 - 144 mmol/L 142 141 142  Potassium 3.5 - 5.2 mmol/L 4.8 4.3 4.2  Chloride 96 - 106 mmol/L 101 105 105  CO2 20 - 29 mmol/L _0 Calcium 8.6 - 10.2 mg/dL 10.5(H) 10.4(H) 9.6   Hepatic Function Latest Ref Rng & Units 12/04/2020 05/15/2020 12/20/2019  Total Protein 6.0 - 8.5 g/dL 7.1 7.3 6.9  Albumin 3.7 - 4.7 g/dL 4.6 4.0 -  AST 0 - 40 IU/L _1 ALT 0 - 44 IU/L 43 32 26  Alk Phosphatase 44 - 121 IU/L 51 41 -  Total Bilirubin 0.0 - 1.2 mg/dL 0.8 0.9 0.7  Bilirubin, Direct 0.0 - 0.3 mg/dL - - -   CBC Latest Ref Rng & Units 12/04/2020 05/15/2020 12/20/2019  WBC 3.4 - 10.8 x10E3/uL 5.6 5.1 5.7  Hemoglobin 13.0 - 17.7 g/dL 15.5 15.0 14.9   Hematocrit 37.5 - 51.0 % 46.6 45.3 44.3  Platelets 150 - 450 x10E3/uL 128(L) 113(L) 119(L)   Lab Results  Component Value Date   TSH 2.350 12/04/2020   Lipid Panel     Component Value Date/Time   CHOL 103 12/20/2019 0856   TRIG 152 (H) 12/20/2019 0856   HDL 33 (L) 12/20/2019 0856   CHOLHDL 3.1 12/20/2019 0856   VLDL 37 (H) 10/25/2016 0853   LDLCALC 47 12/20/2019 0856   LDLDIRECT 65.0 04/12/2013 0825     RADIOLOGY: No results found.  IMPRESSION:  1. Atrial fibrillation, unspecified type ([place])   2. Essential hypertension   3. OSA treated with BiPAP   4. Iniitiation of amiodarone therapy and monitoring   5. Bilateral leg edema   6. Hyperlipidemia LDL goal <70   7. Anticoagulation adequate     ASSESSMENT AND PLAN: Martin Cordova is a 80 year-old Caucasian male who has documented normal coronary arteries by cardiac catheterization in 2008. He has a history of hypertension, mixed hyperlipidemia, and has paroxysmal atrial fibrillation/flutter. He has a history of diabetes mellitus and after he had lost a significant amount of weight and with renal insufficiency he was taken off his diabetic medication.  However, due to recurrent weight gain he is back on therapy with Tradjenta followed by Martin Cordova.  He underwent successful catheter ablation for recurrent atrial flutter in November 2018.  He continues to be on long-term anticoagulation with Eliquis.  He denies recurrent arrhythmia.  Dr Caryl Comes had ordered an MRI of his heart in February 2019 which did not reveal any delayed gadolinium uptake arguing against scar, infiltration or hypertrophic cardiomyopathy.  There was no SAM.  Since his last evaluation with me in November 2021, he has noticed more fatigability.  He had been documented to be in sinus rhythm since 2019.  His EKG today confirms that he is back in  atrial fibrillation.  I suspect this may be of several months duration.  He has been on diltiazem CD 240 mg daily, carvedilol 25 mg  twice a day and furosemide 40 mg daily both for rate control and edema and blood pressure control.  His blood pressure today is stable on repeat by me.  With his recurrent atrial fibrillation I have recommended institution of amiodarone.  I will start him on 200 mg twice a day.  I discussed with him potential adverse consequences of amiodarone and the importance of monitoring LFTs and thyroid function.  I will schedule him for a comprehensive metabolic panel, TSH and CBC.  I discussed potential photosensitivity and use of sunblock.  We also discussed rare issues with lung involvement.  I am scheduling him for 2D echo Doppler study to reassess LV systolic and diastolic function.  He continues to anticoagulation without bleeding.  He is on low-dose atorvastatin 10 mg for hyperlipidemia.  With reference to his sleep apnea, it appears that although he was set supposedly at an EPAP minimum of 18 and his device settings, apparently he is only been reaching 17 cm water pressure.  I question whether or not his previous adjustments were ever sent to his machine.  As result I will change his settings to an EPAP minimum of 18, pressure support of 4, and maximum IPAP pressure of 25.  I am increasing by flex to 3 and I am increasing his ramp start time to 12.  This was sent to his modem.  I will see him in 4 weeks for reevaluation.  Hopefully he will of cardioverted pharmacologically, but if not we will then make plans for outpatient cardioversion.   Shelva Majestic, MD  12/06/2020  10:05 AM

## 2020-12-04 NOTE — Patient Instructions (Signed)
Medication Instructions:  BEGIN amiodarone 200mg  (1 tablet) twice daily.  *If you need a refill on your cardiac medications before your next appointment, please call your pharmacy*   Lab Work: CMET, CBC, TSH to be drawn today.   If you have labs (blood work) drawn today and your tests are completely normal, you will receive your results only by: [place] (if you have MyChart) OR A paper copy in the mail If you have any lab test that is abnormal or we need to change your treatment, we will call you to review the results.   Testing/Procedures: Your physician has requested that you have an echocardiogram. Echocardiography is a painless test that uses sound waves to create images of your heart. It provides your doctor with information about the size and shape of your heart and how well your heart's chambers and valves are working. This procedure takes approximately one hour. There are no restrictions for this procedure.    Follow-Up: At [facility], you and your health needs are our priority.  As part of our continuing mission to provide you with exceptional heart care, we have created designated Provider Care Teams.  These Care Teams include your primary Cardiologist (physician) and Advanced Practice Providers (APPs -  Physician Assistants and Nurse Practitioners) who all work together to provide you with the care you need, when you need it.  We recommend signing up for the patient portal called "MyChart".  Sign up information is provided on this After Visit Summary.  MyChart is used to connect with patients for Virtual Visits (Telemedicine).  Patients are able to view lab/test results, encounter notes, upcoming appointments, etc.  Non-urgent messages can be sent to your provider as well.   To learn more about what you can do with MyChart, go to [email or web address].    Your next appointment:   4 week(s)  The format for your next appointment:   In Person  Provider:   Shelva Majestic, MD

## 2020-12-06 ENCOUNTER — Encounter: Payer: Self-pay | Admitting: Cardiovascular Disease

## 2020-12-14 ENCOUNTER — Telehealth: Payer: Self-pay | Admitting: Cardiovascular Disease

## 2020-12-21 ENCOUNTER — Other Ambulatory Visit: Payer: Self-pay

## 2020-12-21 ENCOUNTER — Encounter: Payer: Self-pay | Admitting: Cardiovascular Disease

## 2020-12-21 ENCOUNTER — Ambulatory Visit: Payer: Medicare PPO | Admitting: Cardiovascular Disease

## 2020-12-21 VITALS — BP 130/70 | HR 55 | Ht 71.0 in | Wt 248.8 lb

## 2020-12-21 DIAGNOSIS — Z7901 Long term (current) use of anticoagulants: Secondary | ICD-10-CM

## 2020-12-21 DIAGNOSIS — G4733 Obstructive sleep apnea (adult) (pediatric): Secondary | ICD-10-CM

## 2020-12-21 DIAGNOSIS — Z5181 Encounter for therapeutic drug level monitoring: Secondary | ICD-10-CM

## 2020-12-21 DIAGNOSIS — E1122 Type 2 diabetes mellitus with diabetic chronic kidney disease: Secondary | ICD-10-CM

## 2020-12-21 DIAGNOSIS — N184 Chronic kidney disease, stage 4 (severe): Secondary | ICD-10-CM

## 2020-12-21 DIAGNOSIS — Z79899 Other long term (current) drug therapy: Secondary | ICD-10-CM

## 2020-12-21 DIAGNOSIS — I480 Paroxysmal atrial fibrillation: Secondary | ICD-10-CM

## 2020-12-21 DIAGNOSIS — I4891 Unspecified atrial fibrillation: Secondary | ICD-10-CM

## 2020-12-21 MED ORDER — FUROSEMIDE 40 MG PO TABS: ORAL_TABLET | ORAL | 3 refills | Status: DC

## 2020-12-21 NOTE — Progress Notes (Signed)
Patient ID: Martin Cordova, male   DOB: 1940/11/16, 80 y.o.   MRN: [id number]      HPI: Jaxyn Rout Mcbroom is a 80 y.o. male is who presents to the office today for a 3 week follow-up cardiology clinic evaluation.  Mr. 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 In the past, he developed an obstructive uropathy attributed to kidney stones; creatinine had risen up to 3 and ultimately improved and stabilized at approximately 1.7. Mr. Vangilder uses his BiPAP daily and does note good sleep.  In the past he had issues with mild peripheral edema, which ultimately improved.  He had been previously taken off his lisinopril when his creatinine had risen in the setting of his obstructive uropathy.  In 2015 he developed recurrent episodes of palpitations and was found to have recurrent paroxysmal atrial fibrillation/flutter.  His medications were adjusted and  he was started on antiarrhythmic therapy with Rythmol to take in addition to his increasing doses of carvedilol.  He was started on Eliquis for anticoagulation.  He underwent successful cardioversion on 08/05/2013.  Since that time, he is unaware of any recurrent atrial fibrillation.  He has noticed more energy. He had been maintained on 50 mg twice a day of carvedilol in addition to Rythmol 225 mg every 8 hours, but due to slow pulse rates, these doses have ultimately been reduced.  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 He has been on CPAP therapy initially for approximately 8 years and has been on BiPAP auto SV for over 4 years. He admits to using his BiPAP with 100% compliance.  He obtained a new BiPAP machine in June.  He feels that his machine is  significantly improved from his prior one. When I last saw him I reviewed his  download of his new BiPAP auto SV machine indicates that 90% of the time is EPAP pressure was 17 and 90% of the time his pressure support was 5.8, giving an IPAP of 21.8 cm.  He is using it 100% of the time and is averaging 5 hours and 49 minutes on his most recent download from 12/07/2013 through 01/05/2014.  His average AHI was 7.5.  His device setting maximum BiPAP pressure is 25 cm in maximum EPAP pressure 21 cm.  His average central event index is 1.3, and average obstructive apneic index 0.2, with an average copy index of 6.0.  Average breath.  Rate is 18 breaths per minute with a minute ventilation of 12.1.  Average total body may 670 mL.  When I saw him on 10/21/15 he was unaware of any arrhythmia.  He was on carvedilol 12.5 mg twice a day, Rythmol 225 mg twice a day, hydralazine 25 mg twice a day and eliquis  5 mg twice a day.  He also has been taking omeprazole for GERD and atorvastatin 10 mg for hyperlipidemia. He was found to have recurrent atrial flutter with variable block.  At that time, I recommended that he increase his Rythmol and take 225 mg every 8 hours and further increased his carvedilol to 18.75 mg twice a day.  Laboratory had revealed a magnesium of 2.0.  He has stage III chronic kidney disease  and his BUN was 26, creatinine 1.71 with a GFR estimate of 39.  Potassium was 4.8.  TSH was normal at 2.4.  Insomnia.  One week later in follow-up he had reverted back to sinus rhythm..  When seen in September 2017, he was maintaining sinus rhythm.  Since then, he admits to weight gain.  In early April, he began to notice some increasing shortness of breath and elevated heart rate and fatigue.  He was seen by Hao Meng on 09/07/2016 and was found to be back in atrial flutter with a ventricular rate at 90.  He has been on eloquence 5 g twice a day for anticoagulation and was on carvedilol 18.75 twice a day.  Carvedilol  dose was increased to 25 mg twice a day.  He was also told at that time to try increasing his Propofenone to 300 mg 3 times a day.  He took this for several days but did not tolerate the increased dose and reduced it back to its previous dose of 225 mg every 8 hours.  When I saw him on 10/20/2016  I increased carvedilol to 37.5 mg twice a day.  He has continued to use his BiPAP therapy for sleep apnea.    When I saw him in June 2018 he was still in atrial flutter with 3:1 block.  I discontinued hydralazine and started him on Cardizem CD 180 mg, both for blood pressure and potential antiarrhythmic therapy. He also was having ankle edema and I started him on low-dose hydrochlorothiazide.  His peripheral edema significantly improved.  He has felt better.  He denied episodes of chest tightness.  He underwent successful outpatient DC cardioversion by me on 12/26/2016.  He successfully cardioverted at 120 joules.  When he returned for a follow-up evaluation on 01/12/2017 and saw  Hao Meng, PAC .  He was back in atrial flutter.  At that time his propafenone was discontinued.  Over the past several months, Mr. Hebert has felt well.  At times there is some trace swelling in his ankles.  He continues to use his BiPAP with 100% compliance.  He was hospitalized on September 19 with an episode of diverticulitis and was discharged on 02/24/2017.   When I saw him in October 2018, he was back in atrial flutter.  At that time, I recommended consideration for atrial flutter ablation.  He was evaluated by Dr. Klein and on 04/19/2017 underwent successful atrial flutter ablation across the caval tricuspid isthmus, which interrupted bidirectional conduction.  Initial plan was for him to continue anticoagulation for 4 weeks, but unfortunately he developed irregular tachycardia palpitations about 48 hours after the procedure.  He's been found to have paroxysmal atrial fibrillation.  He as result is on chronic anticoagulation  therapy.  In November 2018 an echo Doppler study showed an EF of 40-45%.  He has been on carvedilol 25 mg twice a day and takes an extra carvedilol as needed.  He has had recurrent short burst of atrial fibrillation, but had an episode for almost an entire day.  He received a new BiPAP machine after his previous machine completely malfunction.  He admits to 100% compliance.  [facility] is his DME company.  He has diabetes mellitus and was started back on Tradjenta for hemoglobin A1c of 7.    He has a [employer] BiPAP auto SV unit. I obtained a download from September 02, 2017 through October 01, 2017.  He is compliant.  His average CPAP pressure   was 16.8 and average pressure support pressure was 4.1.  90% of the time device EPAP was 17 cm.  AHI, however, was still mildly increased at 8.9.  He is unaware of breakthrough snoring.  He denies restless legs.  He denies chest pain.  He has noticed some mild episodes of dizziness in the morning.    I recommended some changes to his BiPAP therapy and change his ramp time to 5 minutes, increased but pressure support to 5.5 increased his EPAP to 9 cm water pressure.  A download was obtained from June 12 through December 14, 2017.  He is 100% compliant and averaging his hours and 24 minutes of CPAP use per night.  His average AHI is still slightly elevated 8.2.  90% of time device CPAP was 17 cm water pressure.  Though his pressure support range can from 0 to 5.5 cm, he apparently has only been needing a pressure support of 2.4 cm of water.  He feels well.  He has a F&P medium cushion full facemask but he is an oral breather and when he opens his mouth the facemask is actually too small which may be contributing to some of his increased AHI.  He presents to sleep clinic for further evaluation. He had been evaluated by Dr. Deterding for his renal issues.  He denies any awareness of recurrent atrial flutter.  He has seen Dr. Pickard here he also has been  diagnosed with monoclonal gammopathy of undetermined significance and is monitored by Dr. Kale.  He had undergone a cardiac MRI which was ordered by Dr. Klein last year in February 2019 which showed normal LV size and function with an EF of 54%.  There was no delayed gadolinium uptake on inversion recovery sequences.  He had a trileaflet aortic valve with mild AR, mild MR, moderate RV enlargement, moderate biatrial enlargement and there was moderate LVH with the mid and basal septum measuring 14 mm compared to the posterior wall at 9 mm.  I last saw him in February 2020 at which time he denied any recurrent chest pain or abnormal heart rhythm.  He was using his BiPAP auto SV with 100% compliance.  A download was obtained in the office from January 26 through July 30, 2018.  He is 100% compliant.  He requires high pressures and his average EPAP pressure was 17 which also was his 90% pressure.  He is set up to a potential maximum IPAP pressure of 30.  His pressure support ranges from 0-5.5 with an average pressure support of 2.4 cm.  He is now using a large mask which is much more comfortable and is he is doing better.  On his most recent download, his AHI was still mildly increased at 7.1/h.  He has undergone follow-up hematologic evaluation with Dr. Cale for his monoclonal gammopathy.    I last saw him on April 05, 2019.  Since his prior evaluation he felt that he may have had 3 brief short-lived episodes of PAF which resolved spontaneously.  He denied any associated chest pain. He underwent Mohs surgery for a skin cancer on his chin.  He was unable to use BiPAP for several days since this is where his mask would rest.  I obtained a download from July 1 through April 04, 2019.  His minimum EPAP pressure was 17 with a maximum IPAP pressure of 30 and pressure support of 5.5 cm.  Apparently his ramp start pressure is 9.5 cm.  Average AHI is 6.9.    90% of the time his device EPAP pressure was 17.  Average  breath rate was 16.1 breaths/min.   Echo Doppler study on January 25, 2019 showed an EF of 60 to 65% with mild LVH.  Normal diastolic parameters.  There was moderate aortic sclerosis without stenosis and mild AR.  I last saw him on Oct 08, 2019 at which time he felt well.  He is followed by nephrology with stage IV chronic kidney disease with recent creatinine in February 2021 at 2.59.  He was unaware of any recurrent atrial fibrillation or flutter.  He continues to use BiPAP auto SV therapy with 100% compliance.  He is followed by Dr. Buddy Duty for his diabetes mellitus.   I  saw him in November 2021 at which time he remained stable.  He is now followed by Dr. Baird Cancer with the imminent retirement of Dr. Baird Cancer.  Laboratory from April 03, 2020 did show improvement in his creatinine which typically range between 2 and 2.5 and was 1.99.  He denies any significant shortness of breath.  At times he has noticed.  Short brief episodes of possible PAF which self resolved.  His medical regimen has included carvedilol 25 mg twice a day, diltiazem 240 mg daily, furosemide 40 mg for blood pressure and heart rate control.  He is diabetic on Tradjenta.  He has been on Eliquis without bleeding for anticoagulation and continues to be on low-dose atorvastatin and lovaza for hyperlipidemia.  He has a [employer] BiPAP auto SV machine and I discussed with him the recall with a very small risk of Styrofoam breakdown contributed by possible ozone related cleaning cleansing agents.  He had contacted [employer] and is on a list for a new machine.    I saw him for follow-up evaluation on December 04, 2020.  Since his prior evaluation he had noticed several months of increased fatigability as well as some mild leg swelling right greater than left.  He denied any chest pain.  He has continued to use his BiPAP ASV.  I obtained a download in the office from June 1 through December 03, 2020 which shows 100% use.  AHI was 5.8 and is 90% EPAP was 17  and 90% pressure support was 3.3 cm.  He denies any chest pain.  He is unaware of rapid palpitations.  During that evaluation, his ECG confirmed that he was in atrial fibrillation with ventricular rate at 79 with right bundle branch block and PVCs.  I suspected that his atrial fibrillation may have been of several months duration and he had been on diltiazem CD2 140 mg daily, carvedilol 25 mg twice a day, and furosemide 40 mg daily both for rate control, edema and blood pressure control.  With his recurrent atrial fibrillation I recommended institution of amiodarone and started him on 200 mg twice a day.  I discussed potential adverse consequences of amiodarone and the importance of monitoring LFTs and thyroid function.  I scheduled him for laboratory and recommended a follow-up echo Doppler study.  He states he is prescription filled and started his therapy on December 08, 2020.  After several days he seemed to notice slightly more energy.  He is unaware of palpitations.  He presents for follow-up evaluation.   Past Medical History  Diagnosis Date   GERD 12/07/2006   HYPERGLYCEMIA 12/07/2006   HYPERLIPIDEMIA 12/07/2006    mixed wth an atherogenic dyslipidemic pattern   HYPERTENSION 12/07/2006   OBESITY 11/10/2009   THROMBOCYTOPENIA 12/07/2006   TRANSAMINASES, SERUM,  ELEVATED 12/07/2006   SLEEP APNEA 10/22/2007    uses bipap   VENTRICULAR TACHYCARDIA 03/27/2007    monitored by dr. Claiborne Billings   Diabetes mellitus without complication    Renal insufficiency     Cr to 3 on ACE   Nephrolithiasis    Normal coronary arteries 04/06/2007    by cath EF 55%., last ECHO 10/13/10 EF>55% mild MR, last nuc 06/16/10 EF 57% low risk scan   Pulmonary HTN     RV syst.  40-12mmHg- moderate by echo   Palpitations     tachy   PAF (paroxysmal atrial fibrillation) 04/09/2013    Past Surgical History  Procedure Laterality Date   Cholecystectomy     Rotator cuff repair     Cystoscopy/retrograde/ureteroscopy  08/19/2011    Procedure:  CYSTOSCOPY/RETROGRADE/URETEROSCOPY;  Surgeon: Hanley Ben, MD;  Location: [facility];  Service: Urology;  Laterality: Right;  JJ STENT PLACEMENT    Cardiac catheterization  04/06/2007    normal cors    Allergies  Allergen Reactions   Lisinopril     Renal insufficiency    Current Outpatient Medications:    amiodarone (PACERONE) 200 MG tablet, Take 1 tablet (200 mg total) by mouth 2 (two) times daily., Disp: 180 tablet, Rfl: 3   apixaban (ELIQUIS) 5 MG TABS tablet, Take 1 tablet (5 mg total) by mouth 2 (two) times daily., Disp: 180 tablet, Rfl: 1   atorvastatin (LIPITOR) 10 MG tablet, TAKE 1 TABLET BY MOUTH EVERYDAY AT BEDTIME, Disp: 90 tablet, Rfl: 3   carvedilol (COREG) 25 MG tablet, TAKE 1 TABLET BY MOUTH TWICE A DAY WITH MEALS, Disp: 180 tablet, Rfl: 2   diltiazem (CARDIZEM CD) 240 MG 24 hr capsule, TAKE 1 CAPSULE (240 MG TOTAL) BY MOUTH AT BEDTIME., Disp: 90 capsule, Rfl: 3   famotidine (PEPCID) 10 MG tablet, Take 1 tablet by mouth daily., Disp: , Rfl:    fluoruracil (FLUOROPLEX) 1 % cream, Apply topically 2 (two) times daily., Disp: , Rfl:    linagliptin (TRADJENTA) 5 MG TABS tablet, Take 5 mg by mouth daily., Disp: , Rfl:    omega-3 acid ethyl esters (LOVAZA) 1 g capsule, TAKE 2 CAPSULES BY MOUTH EVERY DAY, Disp: 180 capsule, Rfl: 3   potassium citrate (UROCIT-K) 10 MEQ (1080 MG) SR tablet, Take 10 mEq by mouth 2 (two) times daily. , Disp: , Rfl:    sildenafil (VIAGRA) 100 MG tablet, Take 0.5-1 tablets (50-100 mg total) by mouth daily as needed for erectile dysfunction., Disp: 5 tablet, Rfl: 11   furosemide (LASIX) 40 MG tablet, Alternate between taking $RemoveBefore'40mg'PVJToNtnWqhPS$  (1 tablet) a day and $Remov'20mg'WhHojp$  (1/2 tablet) a day., Disp: 90 tablet, Rfl: 3  Current Facility-Administered Medications:    0.9 %  sodium chloride infusion, 500 mL, Intravenous, Once, Armbruster, Carlota Raspberry, MD  Socially he is married has 4 children and 5 grandchildren. He is a distant relative to the "Yellowhair  brothers." He does try to walk and exercise. There is no tobacco use. He does drink occasional alcohol.   ROS General: Negative; No fevers, chills, or night sweats;  HEENT: Negative; No changes in vision or hearing, sinus congestion, difficulty swallowing Pulmonary: Negative; No cough, wheezing, shortness of breath, hemoptysis Cardiovascular: See history of present illness No recent peripheral edema GI: Status post recent episode of diverticulitis requiring 2 day hospitalization GU:  No dysuria, hematuria, or difficulty voiding; some difficulty with erectile function Musculoskeletal: Negative; no myalgias, joint pain, or weakness Hematologic/Oncology: Positive M spike with  monoclonal gammopathy of undetermined significance; history of thrombocytopenia Endocrine: Negative; no heat/cold intolerance; no diabetes Neuro: Negative; no changes in balance, headaches Skin: Negative; No rashes or skin lesions Psychiatric: Negative; No behavioral problems, depression Sleep: He is using his BiPAP Auto SV therapy with 100% compliance.  No snoring, daytime sleepiness, hypersomnolence, bruxism, restless legs, hypnogognic hallucinations, no cataplexy Other comprehensive 14 point system review is negative.  PE BP 130/70 (BP Location: Right Arm)   Pulse (!) 55   Ht $R'5\' 11"'bY$  (1.803 m)   Wt 248 lb 12.8 oz (112.9 kg)   BMI 34.70 kg/m    Repeat blood pressure by me was 136/70  Wt Readings from Last 3 Encounters:  12/21/20 248 lb 12.8 oz (112.9 kg)  12/04/20 249 lb 9.6 oz (113.2 kg)  11/11/20 249 lb 12.8 oz (113.3 kg)    General: Alert, oriented, no distress.  Skin: normal turgor, no rashes, warm and dry HEENT: Normocephalic, atraumatic. Pupils equal round and reactive to light; sclera anicteric; extraocular muscles intact;  Nose without nasal septal hypertrophy Mouth/Parynx benign; Mallinpatti scale 3 Neck: No JVD, no carotid bruits; normal carotid upstroke Lungs: clear to ausculatation and  percussion; no wheezing or rales Chest wall: without tenderness to palpitation Heart: PMI not displaced, RRR, s1 s2 normal, 1/6 systolic murmur, no diastolic murmur, no rubs, gallops, thrills, or heaves Abdomen: soft, nontender; no hepatosplenomehaly, BS+; abdominal aorta nontender and not dilated by palpation. Back: no CVA tenderness Pulses 2+ Musculoskeletal: full range of motion, normal strength, no joint deformities Extremities: Resolution of prior edema.  No clubbing cyanosis or edema, Homan's sign negative  Neurologic: grossly nonfocal; Cranial nerves grossly wnl Psychologic: Normal mood and affect    December 21, 2020 ECG (independently read by me): Sinus bradycardia at 55, 1st degree AV block; PR 228 msec; RBBBB   December 04, 2020 ECG (independently read by me): Atrial fibrillation at 79, RBBB, PVC  May 04, 2020 ECG (independently read by me): Sinus bradycardia at 56, PVC, RBBB  May 4, 2021ECG (independently read by me): Normal sinus rhythm at 63 bpm with premature complex.  Right bundle branch block with repolarization changes.  Normal intervals.  April 05, 2019 ECG (independently read by me): Sinus Bradycardia ay 55: RBBB with repolarization.  February 25,2020 ECG (independently read by me): Sinus bradycardia at 56 bpm.  Right bundle branch block with repolarization changes.  October 03, 2017 ECG (independently read by me):  Sinus bradycardia at 50 bpm.  Right bundle branch block with repolarization changes. QTc interval 461 ms.  January 2019 ECG (independently read by me): Sinus bradycardia 56 bpm.  Possible left atrial enlargement.  Right bundle branch block with repolarization changes.  PR interval 186 ms; QTc interval 436 ms  October 2018 ECG (independently read by me): Atrial flutter with variable block at 96 bpm.  Right bundle-branch block with repolarization changes.  QTc interval 437 ms  July 2018 ECG (independently read by me): Probable atrial flutter with a  ventricular rate at 82 bpm.  Right bundle-branch block, left anterior hemiblock.  11/08/2016 ECG (independently read by me): Probable atrial flutter 3:1 block , ventricular rate at 85;  right bundle branch block with repolarization changes.  QTc interval 528 ms.   May 2018 ECG (independently read by me): atrial flutter with variable block with ventricular rate at 89 bpm.  Right bundle-branch block, left anterior hemiblock.  September 2017 ECG (independently read by me): Normal sinus rhythm at 63 bpm.  First-degree AV block with a  PR interval of 208 ms.  Right bundle branch block with repolarization changes.  QTc interval 466 ms.  10/28/15 ECG (independently read by me): Sinus bradycardia at 54 bpm.  First degree block with PR interval 222 ms.  Right bundle-branch block.  10/21/2015 ECG (independently read by me): Atrial flutter with variable block.  Right bundle-branch block with repolarization changes.  October 2016 ECG (independently read by me): sinus rhythm with first-degree AV block with a PR interval at 216 ms.  Ventricular rate 86 bpm with occasional PVCs.    May 2016 ECG (independently read by me): Normal sinus rhythm at 60 bpm.  Right bundle-branch block with repolarization changes.  November 2015 ECG (independently read by me): Normal sinus rhythm at 61.  Nonspecific T abnormality.  QTc interval 394 ms.  11/18/2013 ECG (independently read by me): Sinus bradycardia 58 beats per minute.  PR interval 198 ms; QTc interval 371 ms.  Nonspecific ST changes.  07/29/2013 ECG  (independently read by me): Atrial flutter with 2:1 block with a ventricular rate of 87 beats per minute. Atrial rate is approximately 360 ms. 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 fib flutter now with right bundle branch block and repolarization changes.  Prior ECG of 06/04/2013: EKG  suggests probable atrial flutter with a ventricular rate of 105  beats per minute. There also are frequent PVCs. QTc interval is 436 ms. They're nonspecific T changes.  LABS: BMP Latest Ref Rng & Units 12/04/2020 05/15/2020 12/20/2019  Glucose 65 - 99 mg/dL 119(H) 156(H) 125(H)  BUN 8 - 27 mg/dL 39(H) 36(H) 34(H)  Creatinine 0.76 - 1.27 mg/dL 2.24(H) 2.57(H) 2.24(H)  BUN/Creat Ratio 10 - 24 17 - 15  Sodium 134 - 144 mmol/L 142 141 142  Potassium 3.5 - 5.2 mmol/L 4.8 4.3 4.2  Chloride 96 - 106 mmol/L 101 105 105  CO2 20 - 29 mmol/L $RemoveB'24 27 24  'jNzkDvNN$ Calcium 8.6 - 10.2 mg/dL 10.5(H) 10.4(H) 9.6   Hepatic Function Latest Ref Rng & Units 12/04/2020 05/15/2020 12/20/2019  Total Protein 6.0 - 8.5 g/dL 7.1 7.3 6.9  Albumin 3.7 - 4.7 g/dL 4.6 4.0 -  AST 0 - 40 IU/L $Remov'21 20 18  'ziEdcT$ ALT 0 - 44 IU/L 43 32 26  Alk Phosphatase 44 - 121 IU/L 51 41 -  Total Bilirubin 0.0 - 1.2 mg/dL 0.8 0.9 0.7  Bilirubin, Direct 0.0 - 0.3 mg/dL - - -   CBC Latest Ref Rng & Units 12/04/2020 05/15/2020 12/20/2019  WBC 3.4 - 10.8 x10E3/uL 5.6 5.1 5.7  Hemoglobin 13.0 - 17.7 g/dL 15.5 15.0 14.9  Hematocrit 37.5 - 51.0 % 46.6 45.3 44.3  Platelets 150 - 450 x10E3/uL 128(L) 113(L) 119(L)   Lab Results  Component Value Date   TSH 2.350 12/04/2020   Lipid Panel     Component Value Date/Time   CHOL 103 12/20/2019 0856   TRIG 152 (H) 12/20/2019 0856   HDL 33 (L) 12/20/2019 0856   CHOLHDL 3.1 12/20/2019 0856   VLDL 37 (H) 10/25/2016 0853   LDLCALC 47 12/20/2019 0856   LDLDIRECT 65.0 04/12/2013 0825     RADIOLOGY: No results found.  IMPRESSION:  1. Paroxysmal atrial fibrillation (HCC)   2. Encounter for monitoring amiodarone therapy   3. Type 2 diabetes mellitus with chronic kidney disease, without long-term current use of insulin, unspecified CKD stage ([facility])   4. OSA treated with BiPAP   5. CKD (chronic kidney disease) stage 4,  GFR 15-29 ml/min (HCC)   6. Anticoagulation adequate     ASSESSMENT AND PLAN: Mr. Woodrome is a 81 year-old Caucasian male who has documented normal coronary  arteries by cardiac catheterization in 2008. He has a history of hypertension, mixed hyperlipidemia, and has paroxysmal atrial fibrillation/flutter. He has a history of diabetes mellitus and after he had lost a significant amount of weight and with renal insufficiency he was taken off his diabetic medication.  However, due to recurrent weight gain he is back on therapy with Tradjenta followed by Dr. Buddy Duty.  He underwent successful catheter ablation for recurrent atrial flutter in November 2018.  He continues to be on long-term anticoagulation with Eliquis.  He denies recurrent arrhythmia.  Dr Caryl Comes had ordered an MRI of his heart in February 2019 which did not reveal any delayed gadolinium uptake arguing against scar, infiltration or hypertrophic cardiomyopathy.  There was no SAM.  Since his evaluation with me in November 2021, he had noticed more fatigability.  At his most recent office visit on July 1 ECG confirmed that he was in atrial fibrillation which I suspect was of several months duration.  At the time he already was on carvedilol 25 mg twice a day, Cardizem CD 240 mg daily, and furosemide 40 mg daily.  I initiated amiodarone 20 mg twice a day which he started on July 10/2020.  He has noticed his fatigue has improved.  His ECG confirms that he has converted back to sinus rhythm.  He has previously noted right bundle branch block.  I reviewed his most recent laboratory from July 1.  Creatinine was 2.24 which actually was improved from December 2021 when his creatinine was 2.57.  He has seen Dr. Dr. Mina Marble in the past and now sees Dr. Joelyn Oms for his renal insufficiency.  His edema has improved.  I have recommended he reduce his furosemide initially to 20 mg alternating with 40 mg and perhaps this may even be changed later to just 20 mg daily depending upon edema.  Pressure today is stable.  He is scheduled to undergo his echo Doppler study on December 23, 2020 and has a physical to see Dr. Jenna Luo his primary  MD on December 22, 2020.  He continues to use his BiPAP with 100% compliance.  He also is on atorvastatin 10 mg daily for hyperlipidemia in addition to low vase a 2 capsules daily.  He is diabetic on Tradjenta.  Presently I have recommended he continue the present dose of amiodarone 20 mg daily.  When I see him in 2 months I will initiate dose reduction to 300 mg and ultimately down to 200 mg daily.    Shelva Majestic, MD  12/22/2020  8:04 AM

## 2020-12-21 NOTE — Patient Instructions (Signed)
Medication Instructions:  Alternate between taking 40mg  (1 tablet) of Lasix daily and 20mg  (1/2 tablet) of Lasix daily.   *If you need a refill on your cardiac medications before your next appointment, please call your pharmacy*   Lab Work: None ordered.   If you have labs (blood work) drawn today and your tests are completely normal, you will receive your results only by: [place] (if you have MyChart) OR A paper copy in the mail If you have any lab test that is abnormal or we need to change your treatment, we will call you to review the results.   Testing/Procedures: None ordered.    Follow-Up: At [facility], you and your health needs are our priority.  As part of our continuing mission to provide you with exceptional heart care, we have created designated Provider Care Teams.  These Care Teams include your primary Cardiologist (physician) and Advanced Practice Providers (APPs -  Physician Assistants and Nurse Practitioners) who all work together to provide you with the care you need, when you need it.  We recommend signing up for the patient portal called "MyChart".  Sign up information is provided on this After Visit Summary.  MyChart is used to connect with patients for Virtual Visits (Telemedicine).  Patients are able to view lab/test results, encounter notes, upcoming appointments, etc.  Non-urgent messages can be sent to your provider as well.   To learn more about what you can do with MyChart, go to [email or web address].    Your next appointment:   2-3 month(s)  The format for your next appointment:   In Person  Provider:   Shelva Majestic, MD

## 2020-12-22 ENCOUNTER — Encounter: Payer: Self-pay | Admitting: *Deleted

## 2020-12-22 ENCOUNTER — Encounter: Payer: Self-pay | Admitting: Family Medicine

## 2020-12-22 ENCOUNTER — Ambulatory Visit (INDEPENDENT_AMBULATORY_CARE_PROVIDER_SITE_OTHER): Payer: Medicare PPO | Admitting: Family Medicine

## 2020-12-22 ENCOUNTER — Encounter: Payer: Self-pay | Admitting: Cardiovascular Disease

## 2020-12-22 VITALS — BP 136/74 | HR 66 | Temp 98.0°F | Resp 14 | Ht 71.0 in | Wt 251.0 lb

## 2020-12-22 DIAGNOSIS — Z0001 Encounter for general adult medical examination with abnormal findings: Secondary | ICD-10-CM | POA: Diagnosis not present

## 2020-12-22 DIAGNOSIS — E785 Hyperlipidemia, unspecified: Secondary | ICD-10-CM | POA: Diagnosis not present

## 2020-12-22 DIAGNOSIS — E119 Type 2 diabetes mellitus without complications: Secondary | ICD-10-CM | POA: Diagnosis not present

## 2020-12-22 DIAGNOSIS — Z125 Encounter for screening for malignant neoplasm of prostate: Secondary | ICD-10-CM | POA: Diagnosis not present

## 2020-12-22 DIAGNOSIS — N184 Chronic kidney disease, stage 4 (severe): Secondary | ICD-10-CM

## 2020-12-22 DIAGNOSIS — I480 Paroxysmal atrial fibrillation: Secondary | ICD-10-CM | POA: Diagnosis not present

## 2020-12-22 DIAGNOSIS — Z0000 Encounter for general adult medical examination without abnormal findings: Secondary | ICD-10-CM

## 2020-12-22 NOTE — Progress Notes (Signed)
Subjective:    Patient ID: Martin Cordova, male    DOB: 1941/01/09, 80 y.o.   MRN: [id number]  HPI  Patient is a very pleasant 80 year old Caucasian male who is here today for complete physical exam.  Past medical history is significant for atrial fibrillation for which she sees cardiology.  Dr. Claiborne Billings recently started him on an amiodarone for rhythm control.  Today he is in normal sinus rhythm.  Dr. Claiborne Billings also recently reduced his dose of Lasix due to worsening renal function.  The patient's creatinine was 2.5 in December but had improved on recent lab work in July.  He is not fluid overloaded today on exam and there is no swelling in his legs or shortness of breath.  He also has type 2 diabetes mellitus for which she takes [place].  He was unable to tolerate Trulicity.  I question if he would benefit from [place] for renal protection in addition to potential cardiac benefit.  We discussed this medication at length today and he is interested per tickly if his microalbumin is elevated.  His last colonoscopy was 2019 and that has already been scheduled for later this year.  He is due for a PSA to check for prostate cancer.  His immunizations are up-to-date.  He denies any falls, depression, or memory loss. Immunization History  Administered Date(s) Administered   Influenza Split 03/06/2012   Influenza Whole 03/19/2009, 03/06/2010   Influenza, High Dose Seasonal PF 02/08/2016, 04/10/2017, 02/15/2018, 02/21/2019, 02/20/2020   Influenza,inj,Quad PF,6+ Mos 02/23/2013   Influenza-Unspecified 04/06/2014, 03/28/2015, 04/10/2017   PFIZER Comirnaty(Gray Top)Covid-19 Tri-Sucrose Vaccine 09/29/2020   PFIZER(Purple Top)SARS-COV-2 Vaccination 06/26/2019, 07/17/2019, 03/11/2020   Pneumococcal Conjugate-13 06/20/2014   Pneumococcal Polysaccharide-23 06/16/2008   Td 01/25/1999, 07/06/2009   Tdap 06/20/2014   Zoster Recombinat (Shingrix) 12/09/2017, 02/15/2018   Zoster, Live 02/09/2013   Office Visit on  12/04/2020  Component Date Value Ref Range Status   WBC 12/04/2020 5.6  3.4 - 10.8 x10E3/uL Final   RBC 12/04/2020 5.15  4.14 - 5.80 x10E6/uL Final   Hemoglobin 12/04/2020 15.5  13.0 - 17.7 g/dL Final   Hematocrit 12/04/2020 46.6  37.5 - 51.0 % Final   MCV 12/04/2020 91  79 - 97 fL Final   MCH 12/04/2020 30.1  26.6 - 33.0 pg Final   MCHC 12/04/2020 33.3  31.5 - 35.7 g/dL Final   RDW 12/04/2020 13.3  11.6 - 15.4 % Final   Platelets 12/04/2020 128 (A) 150 - 450 x10E3/uL Final   Glucose 12/04/2020 119 (A) 65 - 99 mg/dL Final   BUN 12/04/2020 39 (A) 8 - 27 mg/dL Final   Creatinine, Ser 12/04/2020 2.24 (A) 0.76 - 1.27 mg/dL Final   eGFR 12/04/2020 29 (A) >59 mL/min/1.73 Final   BUN/Creatinine Ratio 12/04/2020 17  10 - 24 Final   Sodium 12/04/2020 142  134 - 144 mmol/L Final   Potassium 12/04/2020 4.8  3.5 - 5.2 mmol/L Final   Chloride 12/04/2020 101  96 - 106 mmol/L Final   CO2 12/04/2020 24  20 - 29 mmol/L Final   Calcium 12/04/2020 10.5 (A) 8.6 - 10.2 mg/dL Final   Total Protein 12/04/2020 7.1  6.0 - 8.5 g/dL Final   Albumin 12/04/2020 4.6  3.7 - 4.7 g/dL Final   Globulin, Total 12/04/2020 2.5  1.5 - 4.5 g/dL Final   Albumin/Globulin Ratio 12/04/2020 1.8  1.2 - 2.2 Final   Bilirubin Total 12/04/2020 0.8  0.0 - 1.2 mg/dL Final   Alkaline Phosphatase 12/04/2020  51  44 - 121 IU/L Final   AST 12/04/2020 21  0 - 40 IU/L Final   ALT 12/04/2020 43  0 - 44 IU/L Final   TSH 12/04/2020 2.350  0.450 - 4.500 uIU/mL Final    Past Medical History:  Diagnosis Date   Colon polyps    Diabetes mellitus without complication ([place])    Diverticulitis    Diverticulosis    GERD 12/07/2006   HYPERGLYCEMIA 12/07/2006   HYPERLIPIDEMIA 12/07/2006   mixed wth an atherogenic dyslipidemic pattern   HYPERTENSION 12/07/2006   Nephrolithiasis    Nonalcoholic fatty liver disease    Normal coronary arteries 04/06/2007   by cath EF 55%., last ECHO 10/13/10 EF>55% mild MR, last nuc 06/16/10 EF 57% low risk scan   OBESITY  11/10/2009   OSA treated with BiPAP 10/22/2007   PAF (paroxysmal atrial fibrillation) ([place]) 04/09/2013   Palpitations    tachy   Pulmonary HTN (HCC)    RV syst.  40-52mHg- moderate by echo   Renal insufficiency    Cr to 3 on ACE   Sleep apnea    SOB (shortness of breath)    with exertion   THROMBOCYTOPENIA 12/07/2006   TRANSAMINASES, SERUM, ELEVATED 12/07/2006   VENTRICULAR TACHYCARDIA 03/27/2007   monitored by dr. kClaiborne Billings  Past Surgical History:  Procedure Laterality Date   A-FLUTTER ABLATION N/A 04/19/2017   Procedure: A-FLUTTER ABLATION;  Surgeon: KDeboraha Sprang MD;  [facility];  Service: Cardiovascular;  Laterality: N/A;   CARDIAC CATHETERIZATION  04/06/2007   normal cors   CARDIOVERSION N/A 08/05/2013   Procedure: CARDIOVERSION;  Surgeon: TTroy Sine MD;  [place] [facility]  Service: Cardiovascular;  Laterality: N/A;   CARDIOVERSION N/A 12/26/2016   Procedure: CARDIOVERSION;  Surgeon: KTroy Sine MD;  [facility] [facility]  Service: Cardiovascular;  Laterality: N/A;   CHOLECYSTECTOMY     CYSTOSCOPY/RETROGRADE/URETEROSCOPY  08/19/2011   Procedure: CYSTOSCOPY/RETROGRADE/URETEROSCOPY;  Surgeon: MHanley Ben MD;  [facility]  Service: Urology;  Laterality: Right;  JJ STENT PLACEMENT   ROTATOR CUFF REPAIR Right    Current Outpatient Medications on File Prior to Visit  Medication Sig Dispense Refill   amiodarone (PACERONE) 200 MG tablet Take 1 tablet (200 mg total) by mouth 2 (two) times daily. 180 tablet 3   apixaban (ELIQUIS) 5 MG TABS tablet Take 1 tablet (5 mg total) by mouth 2 (two) times daily. 180 tablet 1   atorvastatin (LIPITOR) 10 MG tablet TAKE 1 TABLET BY MOUTH EVERYDAY AT BEDTIME 90 tablet 3   carvedilol (COREG) 25 MG tablet TAKE 1 TABLET BY MOUTH TWICE A DAY WITH MEALS 180 tablet 2   diltiazem (CARDIZEM CD) 240 MG 24 hr capsule TAKE 1 CAPSULE (240 MG TOTAL) BY MOUTH AT BEDTIME. 90 capsule 3   famotidine (PEPCID)  10 MG tablet Take 1 tablet by mouth daily.     fluoruracil (FLUOROPLEX) 1 % cream Apply topically 2 (two) times daily.     furosemide (LASIX) 40 MG tablet Alternate between taking 460m(1 tablet) a day and 2082m1/2 tablet) a day. 90 tablet 3   linagliptin (TRADJENTA) 5 MG TABS tablet Take 5 mg by mouth daily.     omega-3 acid ethyl esters (LOVAZA) 1 g capsule TAKE 2 CAPSULES BY MOUTH EVERY DAY 180 capsule 3   potassium citrate (UROCIT-K) 10 MEQ (1080 MG) SR tablet Take 10 mEq by mouth 2 (two) times daily.      sildenafil (  VIAGRA) 100 MG tablet Take 0.5-1 tablets (50-100 mg total) by mouth daily as needed for erectile dysfunction. 5 tablet 11   No current facility-administered medications on file prior to visit.   Allergies  Allergen Reactions   Lisinopril Swelling and Other (See Comments)    Renal insufficiency also   Dulaglutide Other (See Comments)   Ibuprofen Other (See Comments)    Per the patient, he has "kidney and liver issues" and is NOT suppose to take this   Social History   Socioeconomic History   Marital status: Married    Spouse name: Not on file   Number of children: 4   Years of education: Not on file   Highest education level: Not on file  Occupational History   Occupation: retired Pharmacist, hospital  Tobacco Use   Smoking status: Former    Years: 10.00    Types: Cigarettes    Quit date: 08/04/1980    Years since quitting: 40.4   Smokeless tobacco: Former    Types: Chew    Quit date: 08/08/2004  Vaping Use   Vaping Use: Never used  Substance and Sexual Activity   Alcohol use: Not Currently   Drug use: No   Sexual activity: Yes  Other Topics Concern   Not on file  Social History Narrative   Not on file   Social Determinants of Health   Financial Resource Strain: Not on file  Food Insecurity: Not on file  Transportation Needs: Not on file  Physical Activity: Not on file  Stress: Not on file  Social Connections: Not on file  Intimate Partner Violence: Not on  file   Family History  Problem Relation Age of Onset   Diabetes Mother    Heart failure Mother    Stroke Mother    Kidney failure Father    Diabetes Father    Cancer Paternal Uncle    Colon cancer Neg Hx    Esophageal cancer Neg Hx    Stomach cancer Neg Hx    Rectal cancer Neg Hx      Review of Systems     Objective:   Physical Exam Vitals reviewed.  Constitutional:      General: He is not in acute distress.    Appearance: He is not diaphoretic.  HENT:     Head: Normocephalic and atraumatic.     Right Ear: External ear normal.     Left Ear: External ear normal.     Nose: Nose normal.     Mouth/Throat:     Pharynx: No oropharyngeal exudate.  Eyes:     General: No scleral icterus.       Right eye: No discharge.        Left eye: No discharge.     Conjunctiva/sclera: Conjunctivae normal.     Pupils: Pupils are equal, round, and reactive to light.  Cardiovascular:     Rate and Rhythm: Normal rate. Rhythm irregular.     Heart sounds: Normal heart sounds. No murmur heard.   No friction rub. No gallop.  Pulmonary:     Effort: Pulmonary effort is normal. No respiratory distress.     Breath sounds: Normal breath sounds. No stridor. No wheezing or rales.  Chest:     Chest wall: No tenderness.  Abdominal:     General: Bowel sounds are normal. There is no distension.     Palpations: Abdomen is soft. There is no mass.     Tenderness: There is no abdominal tenderness. There is  no guarding or rebound.  Musculoskeletal:        General: No tenderness. Normal range of motion.     Cervical back: Normal range of motion and neck supple.  Lymphadenopathy:     Cervical: No cervical adenopathy.  Skin:    General: Skin is warm.     Coloration: Skin is not pale.     Findings: No erythema or rash.  Neurological:     Mental Status: He is alert and oriented to person, place, and time.     Cranial Nerves: No cranial nerve deficit.     Coordination: Coordination normal.  Psychiatric:         Behavior: Behavior normal.        Thought Content: Thought content normal.        Judgment: Judgment normal.          Assessment & Plan:  General medical exam  Prostate cancer screening - Plan: PSA  Controlled type 2 diabetes mellitus without complication, without long-term current use of insulin (HCC) - Plan: Microalbumin, urine, Hemoglobin A1c, Lipid panel  PAF (paroxysmal atrial fibrillation) (HCC)  Hyperlipidemia LDL goal <70  CKD (chronic kidney disease), stage IV (HCC) Diabetic foot exam was performed today and is normal.  Patient had cataract surgery in March of this year and sees his eye doctor regularly.  He denies any falls, depression, or memory loss.  Colonoscopy has already been scheduled.  Check PSA to screen for prostate cancer.  Blood pressure today is well controlled.  He is in normal sinus rhythm and rate controlled as well.  Check fasting lipid panel.  Goal LDL cholesterol is less than 70.  Check microalbumin.  If elevated I would recommend switching to [facility] for chronic kidney disease, diabetes, and proteinuria.  I would recommend discontinuing Tradjenta.  Immunizations are up-to-date.

## 2020-12-23 ENCOUNTER — Other Ambulatory Visit: Payer: Self-pay

## 2020-12-23 ENCOUNTER — Other Ambulatory Visit (HOSPITAL_COMMUNITY): Payer: Medicare PPO

## 2020-12-23 ENCOUNTER — Ambulatory Visit (HOSPITAL_COMMUNITY): Payer: Medicare PPO | Attending: Cardiology

## 2020-12-23 DIAGNOSIS — I4891 Unspecified atrial fibrillation: Secondary | ICD-10-CM | POA: Insufficient documentation

## 2020-12-23 LAB — ECHOCARDIOGRAM COMPLETE
Area-P 1/2: 3.77 cm2
P 1/2 time: 835 msec
S' Lateral: 3.5 cm

## 2020-12-23 LAB — LIPID PANEL
Cholesterol: 108 mg/dL (ref <200)
HDL: 36 mg/dL — ABNORMAL LOW (ref >=40)
LDL Cholesterol (Calc): 48 mg/dL (calc)
Non-HDL Cholesterol (Calc): 72 mg/dL (calc) (ref <130)
Total CHOL/HDL Ratio: 3 (calc) (ref <5.0)
Triglycerides: 153 mg/dL — ABNORMAL HIGH (ref <150)

## 2020-12-23 LAB — HEMOGLOBIN A1C
Hgb A1c MFr Bld: 6.5 % of total Hgb — ABNORMAL HIGH (ref <5.7)
Mean Plasma Glucose: 140 mg/dL
eAG (mmol/L): 7.7 mmol/L

## 2020-12-23 LAB — MICROALBUMIN, URINE: Microalb, Ur: 11.7 mg/dL

## 2020-12-23 LAB — PSA: PSA: 1.25 ng/mL (ref <=4.00)

## 2021-01-01 ENCOUNTER — Encounter: Payer: Self-pay | Admitting: Podiatry

## 2021-01-01 ENCOUNTER — Other Ambulatory Visit: Payer: Self-pay

## 2021-01-01 ENCOUNTER — Ambulatory Visit (INDEPENDENT_AMBULATORY_CARE_PROVIDER_SITE_OTHER): Payer: Medicare PPO | Admitting: Podiatry

## 2021-01-01 DIAGNOSIS — M79674 Pain in right toe(s): Secondary | ICD-10-CM | POA: Diagnosis not present

## 2021-01-01 DIAGNOSIS — N289 Disorder of kidney and ureter, unspecified: Secondary | ICD-10-CM | POA: Diagnosis not present

## 2021-01-01 DIAGNOSIS — E119 Type 2 diabetes mellitus without complications: Secondary | ICD-10-CM | POA: Diagnosis not present

## 2021-01-01 DIAGNOSIS — Z7901 Long term (current) use of anticoagulants: Secondary | ICD-10-CM

## 2021-01-01 DIAGNOSIS — D696 Thrombocytopenia, unspecified: Secondary | ICD-10-CM

## 2021-01-01 DIAGNOSIS — B351 Tinea unguium: Secondary | ICD-10-CM | POA: Diagnosis not present

## 2021-01-01 DIAGNOSIS — M79675 Pain in left toe(s): Secondary | ICD-10-CM | POA: Diagnosis not present

## 2021-01-01 NOTE — Progress Notes (Signed)
This patient returns to my office for at risk foot care.  This patient requires this care by a professional since this patient will be at risk due to having diabetes and renal insufficiency and thrombocytopenia..  Patient is taking eliquis.  This patient is unable to cut nails himself since the patient cannot reach his nails.These nails are painful walking and wearing shoes.  This patient presents for at risk foot care today.  General Appearance  Alert, conversant and in no acute stress.  Vascular  Dorsalis pedis and posterior tibial  pulses are palpable  bilaterally.  Capillary return is within normal limits  bilaterally. Temperature is within normal limits  bilaterally.  Neurologic  Senn-Weinstein monofilament wire test within normal limits  bilaterally. Muscle power within normal limits bilaterally.  Nails Thick disfigured discolored nails with subungual debris  from hallux to fifth toes bilaterally. No evidence of bacterial infection or drainage bilaterally.  Orthopedic  No limitations of motion  feet .  No crepitus or effusions noted.  No bony pathology or digital deformities noted.  HAV  B/L.  Skin  normotropic skin with no porokeratosis noted bilaterally.  No signs of infections or ulcers noted.     Onychomycosis  Pain in right toes  Pain in left toes  Consent was obtained for treatment procedures.   Mechanical debridement of nails 1-5  bilaterally performed with a nail nipper.  Filed with dremel without incident.    Return office visit   3 months                   Told patient to return for periodic foot care and evaluation due to potential at risk complications.   Jackalyn Haith DPM  

## 2021-01-29 DIAGNOSIS — E669 Obesity, unspecified: Secondary | ICD-10-CM | POA: Diagnosis not present

## 2021-01-29 DIAGNOSIS — Q613 Polycystic kidney, unspecified: Secondary | ICD-10-CM | POA: Diagnosis not present

## 2021-01-29 DIAGNOSIS — E1122 Type 2 diabetes mellitus with diabetic chronic kidney disease: Secondary | ICD-10-CM | POA: Diagnosis not present

## 2021-01-29 DIAGNOSIS — N1832 Chronic kidney disease, stage 3b: Secondary | ICD-10-CM | POA: Diagnosis not present

## 2021-02-08 ENCOUNTER — Encounter: Payer: Self-pay | Admitting: Certified Registered Nurse Anesthetist

## 2021-02-09 ENCOUNTER — Other Ambulatory Visit: Payer: Self-pay

## 2021-02-09 ENCOUNTER — Encounter: Payer: Self-pay | Admitting: Gastroenterology

## 2021-02-09 ENCOUNTER — Ambulatory Visit (AMBULATORY_SURGERY_CENTER): Payer: Medicare PPO | Admitting: Gastroenterology

## 2021-02-09 VITALS — BP 137/66 | HR 43 | Temp 96.2°F | Resp 17 | Ht 71.0 in | Wt 249.0 lb

## 2021-02-09 DIAGNOSIS — D128 Benign neoplasm of rectum: Secondary | ICD-10-CM

## 2021-02-09 DIAGNOSIS — D123 Benign neoplasm of transverse colon: Secondary | ICD-10-CM | POA: Diagnosis not present

## 2021-02-09 DIAGNOSIS — Z86010 Personal history of colonic polyps: Secondary | ICD-10-CM | POA: Diagnosis not present

## 2021-02-09 DIAGNOSIS — K5790 Diverticulosis of intestine, part unspecified, without perforation or abscess without bleeding: Secondary | ICD-10-CM

## 2021-02-09 DIAGNOSIS — D124 Benign neoplasm of descending colon: Secondary | ICD-10-CM | POA: Diagnosis not present

## 2021-02-09 DIAGNOSIS — G4733 Obstructive sleep apnea (adult) (pediatric): Secondary | ICD-10-CM | POA: Diagnosis not present

## 2021-02-09 DIAGNOSIS — I10 Essential (primary) hypertension: Secondary | ICD-10-CM | POA: Diagnosis not present

## 2021-02-09 DIAGNOSIS — K5730 Diverticulosis of large intestine without perforation or abscess without bleeding: Secondary | ICD-10-CM | POA: Diagnosis not present

## 2021-02-09 DIAGNOSIS — E119 Type 2 diabetes mellitus without complications: Secondary | ICD-10-CM | POA: Diagnosis not present

## 2021-02-09 MED ORDER — SODIUM CHLORIDE 0.9 % IV SOLN: 500.0000 mL | Freq: Once | INTRAVENOUS | Status: DC

## 2021-02-09 NOTE — Progress Notes (Signed)
VS by JD. 

## 2021-02-09 NOTE — Progress Notes (Signed)
[facility] History and Physical   Primary Care Physician:  Susy Frizzle, MD   Reason for Procedure:   History of colon polyps  Plan:    colonoscopy     HPI: Martin Cordova is a 80 y.o. male  here for colonoscopy surveillance, 5 adenomas removed in 2019. Patient denies any bowel symptoms at this time. No family history of colon cancer known. Otherwise feels well without any cardiopulmonary symptoms. On Eliquis, last dose > 48 hours ago.   Past Medical History:  Diagnosis Date   Cataract    Clotting disorder ([place])    Colon polyps    Diabetes mellitus without complication ([place])    Diverticulitis    Diverticulosis    GERD 12/07/2006   HYPERGLYCEMIA 12/07/2006   HYPERLIPIDEMIA 12/07/2006   mixed wth an atherogenic dyslipidemic pattern   HYPERTENSION 12/07/2006   Nephrolithiasis    Nonalcoholic fatty liver disease    Normal coronary arteries 04/06/2007   by cath EF 55%., last ECHO 10/13/10 EF>55% mild MR, last nuc 06/16/10 EF 57% low risk scan   OBESITY 11/10/2009   OSA treated with BiPAP 10/22/2007   PAF (paroxysmal atrial fibrillation) ([place]) 04/09/2013   Palpitations    tachy   Pulmonary HTN (HCC)    RV syst.  40-1mmHg- moderate by echo   Renal insufficiency    Cr to 3 on ACE   Sleep apnea    SOB (shortness of breath)    with exertion   THROMBOCYTOPENIA 12/07/2006   TRANSAMINASES, SERUM, ELEVATED 12/07/2006   VENTRICULAR TACHYCARDIA 03/27/2007   monitored by dr. Claiborne Billings    Past Surgical History:  Procedure Laterality Date   A-FLUTTER ABLATION N/A 04/19/2017   Procedure: A-FLUTTER ABLATION;  Surgeon: Deboraha Sprang, MD;  Location: [facility];  Service: Cardiovascular;  Laterality: N/A;   CARDIAC CATHETERIZATION  04/06/2007   normal cors   CARDIOVERSION N/A 08/05/2013   Procedure: CARDIOVERSION;  Surgeon: Troy Sine, MD;  Location: [facility];  Service: Cardiovascular;  Laterality: N/A;   CARDIOVERSION N/A 12/26/2016   Procedure:  CARDIOVERSION;  Surgeon: Troy Sine, MD;  Location: [place];  Service: Cardiovascular;  Laterality: N/A;   CHOLECYSTECTOMY     CYSTOSCOPY/RETROGRADE/URETEROSCOPY  08/19/2011   Procedure: CYSTOSCOPY/RETROGRADE/URETEROSCOPY;  Surgeon: Hanley Ben, MD;  [facility];  Service: Urology;  Laterality: Right;  JJ STENT PLACEMENT   ROTATOR CUFF REPAIR Right     Prior to Admission medications   Medication Sig Start Date End Date Taking? Authorizing Provider  amiodarone (PACERONE) 200 MG tablet Take 1 tablet (200 mg total) by mouth 2 (two) times daily. 12/04/20  Yes Troy Sine, MD  atorvastatin (LIPITOR) 10 MG tablet TAKE 1 TABLET BY MOUTH EVERYDAY AT BEDTIME 11/17/20  Yes Troy Sine, MD  carvedilol (COREG) 25 MG tablet TAKE 1 TABLET BY MOUTH TWICE A DAY WITH MEALS 10/20/20  Yes Troy Sine, MD  dapagliflozin propanediol (FARXIGA) 10 MG TABS tablet Take 10 mg by mouth daily.   Yes [provider]  diltiazem (CARDIZEM CD) 240 MG 24 hr capsule TAKE 1 CAPSULE (240 MG TOTAL) BY MOUTH AT BEDTIME. 11/17/20  Yes Troy Sine, MD  famotidine (PEPCID) 10 MG tablet Take 1 tablet by mouth daily. 04/01/19  Yes [provider]  furosemide (LASIX) 40 MG tablet Alternate between taking 40mg  (1 tablet) a day and 20mg  (1/2 tablet) a day. 12/21/20  Yes Troy Sine, MD  omega-3 acid ethyl esters (  LOVAZA) 1 g capsule TAKE 2 CAPSULES BY MOUTH EVERY DAY 11/17/20  Yes Troy Sine, MD  potassium citrate (UROCIT-K) 10 MEQ (1080 MG) SR tablet Take 10 mEq by mouth 2 (two) times daily.  03/10/13  Yes [provider]  apixaban (ELIQUIS) 5 MG TABS tablet Take 1 tablet (5 mg total) by mouth 2 (two) times daily. 10/01/20   Troy Sine, MD  fluoruracil (FLUOROPLEX) 1 % cream Apply topically 2 (two) times daily.    [provider]  linagliptin (TRADJENTA) 5 MG TABS tablet Take 5 mg by mouth daily. Patient not taking: Reported on 02/09/2021     [provider]  sildenafil (VIAGRA) 100 MG tablet Take 0.5-1 tablets (50-100 mg total) by mouth daily as needed for erectile dysfunction. 04/28/15   Susy Frizzle, MD    Current Outpatient Medications  Medication Sig Dispense Refill   amiodarone (PACERONE) 200 MG tablet Take 1 tablet (200 mg total) by mouth 2 (two) times daily. 180 tablet 3   atorvastatin (LIPITOR) 10 MG tablet TAKE 1 TABLET BY MOUTH EVERYDAY AT BEDTIME 90 tablet 3   carvedilol (COREG) 25 MG tablet TAKE 1 TABLET BY MOUTH TWICE A DAY WITH MEALS 180 tablet 2   dapagliflozin propanediol (FARXIGA) 10 MG TABS tablet Take 10 mg by mouth daily.     diltiazem (CARDIZEM CD) 240 MG 24 hr capsule TAKE 1 CAPSULE (240 MG TOTAL) BY MOUTH AT BEDTIME. 90 capsule 3   famotidine (PEPCID) 10 MG tablet Take 1 tablet by mouth daily.     furosemide (LASIX) 40 MG tablet Alternate between taking 40mg  (1 tablet) a day and 20mg  (1/2 tablet) a day. 90 tablet 3   omega-3 acid ethyl esters (LOVAZA) 1 g capsule TAKE 2 CAPSULES BY MOUTH EVERY DAY 180 capsule 3   potassium citrate (UROCIT-K) 10 MEQ (1080 MG) SR tablet Take 10 mEq by mouth 2 (two) times daily.      apixaban (ELIQUIS) 5 MG TABS tablet Take 1 tablet (5 mg total) by mouth 2 (two) times daily. 180 tablet 1   fluoruracil (FLUOROPLEX) 1 % cream Apply topically 2 (two) times daily.     linagliptin (TRADJENTA) 5 MG TABS tablet Take 5 mg by mouth daily. (Patient not taking: Reported on 02/09/2021)     sildenafil (VIAGRA) 100 MG tablet Take 0.5-1 tablets (50-100 mg total) by mouth daily as needed for erectile dysfunction. 5 tablet 11   Current Facility-Administered Medications  Medication Dose Route Frequency Provider Last Rate Last Admin   0.9 %  sodium chloride infusion  500 mL Intravenous Once Kadie Balestrieri, Carlota Raspberry, MD        Allergies as of 02/09/2021 - Review Complete 02/09/2021  Allergen Reaction Noted   Lisinopril Swelling and Other (See Comments) 07/04/2011   Dulaglutide Other  (See Comments) 01/24/2020   Ibuprofen Other (See Comments) 02/22/2017    Family History  Problem Relation Age of Onset   Diabetes Mother    Heart failure Mother    Stroke Mother    Kidney failure Father    Diabetes Father    Cancer Paternal Uncle    Colon cancer Neg Hx    Esophageal cancer Neg Hx    Stomach cancer Neg Hx    Rectal cancer Neg Hx     Social History   Socioeconomic History   Marital status: Married    Spouse name: Not on file   Number of children: 4   Years of education: Not on  file   Highest education level: Not on file  Occupational History   Occupation: retired Pharmacist, hospital  Tobacco Use   Smoking status: Former    Years: 10.00    Types: Cigarettes    Quit date: 08/04/1980    Years since quitting: 40.5   Smokeless tobacco: Former    Types: Chew    Quit date: 08/08/2004  Vaping Use   Vaping Use: Never used  Substance and Sexual Activity   Alcohol use: Not Currently   Drug use: No   Sexual activity: Yes  Other Topics Concern   Not on file  Social History Narrative   Not on file   Social Determinants of Health   Financial Resource Strain: Not on file  Food Insecurity: Not on file  Transportation Needs: Not on file  Physical Activity: Not on file  Stress: Not on file  Social Connections: Not on file  Intimate Partner Violence: Not on file    Review of Systems: All other review of systems negative except as mentioned in the HPI.  Physical Exam: Vital signs BP (!) 164/79   Pulse (!) 51   Temp (!) 96.2 F (35.7 C)   Resp 14   Ht 5\' 11"  (1.803 m)   Wt 249 lb (112.9 kg)   SpO2 93%   BMI 34.73 kg/m   General:   Alert,  Well-developed, well-nourished, pleasant and cooperative in NAD Lungs:  Clear throughout to auscultation.   Heart:  Regular rate and rhythm Abdomen:  Soft, nontender and nondistended.   Neuro/Psych:  Alert and cooperative. Normal mood and affect. A and O x 3  Jolly Mango, MD [facility]

## 2021-02-09 NOTE — Progress Notes (Signed)
Called to room to assist during endoscopic procedure.  Patient ID and intended procedure confirmed with present staff. Received instructions for my participation in the procedure from the performing physician.  

## 2021-02-09 NOTE — Progress Notes (Signed)
Report given to PACU, vss 

## 2021-02-09 NOTE — Patient Instructions (Addendum)
Handouts on polyps, hemorrhoids, and diverticulosis given to you today  Resume Eliquis as normal on 02/10/21  YOU HAD AN ENDOSCOPIC PROCEDURE TODAY AT [facility]:   Refer to the procedure report that was given to you for any specific questions about what was found during the examination.  If the procedure report does not answer your questions, please call your gastroenterologist to clarify.  If you requested that your care partner not be given the details of your procedure findings, then the procedure report has been included in a sealed envelope for you to review at your convenience later.  YOU SHOULD EXPECT: Some feelings of bloating in the abdomen. Passage of more gas than usual.  Walking can help get rid of the air that was put into your GI tract during the procedure and reduce the bloating. If you had a lower endoscopy (such as a colonoscopy or flexible sigmoidoscopy) you may notice spotting of blood in your stool or on the toilet paper. If you underwent a bowel prep for your procedure, you may not have a normal bowel movement for a few days.  Please Note:  You might notice some irritation and congestion in your nose or some drainage.  This is from the oxygen used during your procedure.  There is no need for concern and it should clear up in a day or so.  SYMPTOMS TO REPORT IMMEDIATELY:  Following lower endoscopy (colonoscopy or flexible sigmoidoscopy):  Excessive amounts of blood in the stool  Significant tenderness or worsening of abdominal pains  Swelling of the abdomen that is new, acute  Fever of 100F or higher  For urgent or emergent issues, a gastroenterologist can be reached at any hour by calling [phone]. Do not use MyChart messaging for urgent concerns.    DIET:  We do recommend a small meal at first, but then you may proceed to your regular diet.  Drink plenty of fluids but you should avoid alcoholic beverages for 24 hours.  ACTIVITY:  You should plan to  take it easy for the rest of today and you should NOT DRIVE or use heavy machinery until tomorrow (because of the sedation medicines used during the test).    FOLLOW UP: Our staff will call the number listed on your records 48-72 hours following your procedure to check on you and address any questions or concerns that you may have regarding the information given to you following your procedure. If we do not reach you, we will leave a message.  We will attempt to reach you two times.  During this call, we will ask if you have developed any symptoms of COVID 19. If you develop any symptoms (ie: fever, flu-like symptoms, shortness of breath, cough etc.) before then, please call [phone].  If you test positive for Covid 19 in the 2 weeks post procedure, please call and report this information to [place].    If any biopsies were taken you will be contacted by phone or by letter within the next 1-3 weeks.  Please call us at [phone] if you have not heard about the biopsies in 3 weeks.    SIGNATURES/CONFIDENTIALITY: You and/or your care partner have signed paperwork which will be entered into your electronic medical record.  These signatures attest to the fact that that the information above on your After Visit Summary has been reviewed and is understood.  Full responsibility of the confidentiality of this discharge information lies with you and/or your care-partner.

## 2021-02-09 NOTE — Op Note (Signed)
[place] Patient Name: Solace Wendorff Procedure Date: 02/09/2021 8:42 AM MRN: [id number] Endoscopist: Remo Lipps P. Havery Moros , MD Age: 80 Referring MD:  Date of Birth: 1940-07-21 Gender: Male Account #: [id number] Procedure:                Colonoscopy Indications:              High risk colon cancer surveillance: Personal                            history of colonic polyps - 5 adenomas in 06/2017 Medicines:                Monitored Anesthesia Care Procedure:                Pre-Anesthesia Assessment:                           - Prior to the procedure, a History and Physical                            was performed, and patient medications and                            allergies were reviewed. The patient's tolerance of                            previous anesthesia was also reviewed. The risks                            and benefits of the procedure and the sedation                            options and risks were discussed with the patient.                            All questions were answered, and informed consent                            was obtained. Prior Anticoagulants: The patient has                            taken Eliquis (apixaban), last dose was 2 days                            prior to procedure. ASA Grade Assessment: III - A                            patient with severe systemic disease. After                            reviewing the risks and benefits, the patient was                            deemed in satisfactory condition to undergo the  procedure.                           After obtaining informed consent, the colonoscope                            was passed under direct vision. Throughout the                            procedure, the patient's blood pressure, pulse, and                            oxygen saturations were monitored continuously. The                            CF [id number] #[id number] was introduced through the  anus                            and advanced to the the cecum, identified by                            appendiceal orifice and ileocecal valve. The                            colonoscopy was performed without difficulty. The                            patient tolerated the procedure well. The quality                            of the bowel preparation was adequate. The                            ileocecal valve, appendiceal orifice, and rectum                            were photographed. Scope In: 9:17:17 AM Scope Out: 9:48:14 AM Scope Withdrawal Time: 0 hours 27 minutes 52 seconds  Total Procedure Duration: 0 hours 30 minutes 57 seconds  Findings:                 The perianal and digital rectal examinations were                            normal.                           A 5 mm polyp was found in the hepatic flexure. The                            polyp was sessile. The polyp was removed with a                            cold snare. Resection and retrieval were complete.  Three sessile polyps were found in the descending                            colon. The polyps were 3 to 5 mm in size. These                            polyps were removed with a cold snare. Resection                            and retrieval were complete. The largest was                            located behind a fold and quite difficult to                            visualize and grasp with snare. Cold forceps were                            used to remove peripheral margin after cold snare                            removed it.                           A 3 mm polyp was found in the rectum. The polyp was                            sessile. The polyp was removed with a cold snare.                            Resection and retrieval were complete.                           Multiple medium-mouthed diverticula were found in                            the transverse colon and left colon.                            Internal hemorrhoids were found during retroflexion.                           The exam was otherwise without abnormality. Several                            minutes spent lavaging the colon to achieve                            adequate views. Complications:            No immediate complications. Estimated blood loss:                            Minimal. Estimated Blood Loss:  Estimated blood loss was minimal. Impression:               - One 5 mm polyp at the hepatic flexure, removed                            with a cold snare. Resected and retrieved.                           - Three 3 to 5 mm polyps in the descending colon,                            removed with a cold snare. Resected and retrieved.                           - One 3 mm polyp in the rectum, removed with a cold                            snare. Resected and retrieved.                           - Diverticulosis in the transverse colon and in the                            left colon.                           - Internal hemorrhoids.                           - The examination was otherwise normal. Recommendation:           - Patient has a contact number available for                            emergencies. The signs and symptoms of potential                            delayed complications were discussed with the                            patient. Return to normal activities tomorrow.                            Written discharge instructions were provided to the                            patient.                           - Resume previous diet.                           - Continue present medications.                           - Resume Eliquis  tomorrow                           - Await pathology results. Remo Lipps P. Shylynn Bruning, MD 02/09/2021 9:55:14 AM This report has been signed electronically.

## 2021-02-11 ENCOUNTER — Telehealth: Payer: Self-pay | Admitting: *Deleted

## 2021-02-11 NOTE — Telephone Encounter (Signed)
  Follow up Call-  Call back number 02/09/2021  Post procedure Call Back phone  # [phone]  Permission to leave phone message Yes  Some recent data might be hidden     Patient questions:  Do you have a fever, pain , or abdominal swelling? No. Pain Score  0 *  Have you tolerated food without any problems? Yes.    Have you been able to return to your normal activities? Yes.    Do you have any questions about your discharge instructions: Diet   No. Medications  No. Follow up visit  No.  Do you have questions or concerns about your Care? No.  Actions: * If pain score is 4 or above: No action needed, pain <4.

## 2021-03-01 DIAGNOSIS — N183 Chronic kidney disease, stage 3 unspecified: Secondary | ICD-10-CM | POA: Diagnosis not present

## 2021-03-09 DIAGNOSIS — N2581 Secondary hyperparathyroidism of renal origin: Secondary | ICD-10-CM | POA: Diagnosis not present

## 2021-03-09 DIAGNOSIS — I129 Hypertensive chronic kidney disease with stage 1 through stage 4 chronic kidney disease, or unspecified chronic kidney disease: Secondary | ICD-10-CM | POA: Diagnosis not present

## 2021-03-09 DIAGNOSIS — I4891 Unspecified atrial fibrillation: Secondary | ICD-10-CM | POA: Diagnosis not present

## 2021-03-09 DIAGNOSIS — N183 Chronic kidney disease, stage 3 unspecified: Secondary | ICD-10-CM | POA: Diagnosis not present

## 2021-03-09 DIAGNOSIS — E1122 Type 2 diabetes mellitus with diabetic chronic kidney disease: Secondary | ICD-10-CM | POA: Diagnosis not present

## 2021-03-17 ENCOUNTER — Encounter: Payer: Self-pay | Admitting: Cardiovascular Disease

## 2021-03-17 ENCOUNTER — Ambulatory Visit: Payer: Medicare PPO | Admitting: Cardiovascular Disease

## 2021-03-17 ENCOUNTER — Other Ambulatory Visit: Payer: Self-pay

## 2021-03-17 VITALS — BP 132/70 | HR 45 | Ht 71.0 in | Wt 249.8 lb

## 2021-03-17 DIAGNOSIS — E1122 Type 2 diabetes mellitus with diabetic chronic kidney disease: Secondary | ICD-10-CM

## 2021-03-17 DIAGNOSIS — I10 Essential (primary) hypertension: Secondary | ICD-10-CM

## 2021-03-17 DIAGNOSIS — N184 Chronic kidney disease, stage 4 (severe): Secondary | ICD-10-CM

## 2021-03-17 DIAGNOSIS — I480 Paroxysmal atrial fibrillation: Secondary | ICD-10-CM | POA: Diagnosis not present

## 2021-03-17 DIAGNOSIS — Z7901 Long term (current) use of anticoagulants: Secondary | ICD-10-CM | POA: Diagnosis not present

## 2021-03-17 DIAGNOSIS — G4733 Obstructive sleep apnea (adult) (pediatric): Secondary | ICD-10-CM

## 2021-03-17 MED ORDER — AMIODARONE HCL 200 MG PO TABS: ORAL_TABLET | ORAL | 0 refills | Status: DC

## 2021-03-17 NOTE — Patient Instructions (Signed)
Medication Instructions:  Decrease Amiodarone to 1.5 tablets (300 mg) daily for 3 weeks, then decrease further to 1 tablet (200 mg) daily.  *If you need a refill on your cardiac medications before your next appointment, please call your pharmacy*   Lab Work: CBC, CMET, TSH in 4 weeks. (No lab appointment is needed)  If you have labs (blood work) drawn today and your tests are completely normal, you will receive your results only by: [place] (if you have MyChart) OR A paper copy in the mail If you have any lab test that is abnormal or we need to change your treatment, we will call you to review the results.  Follow-Up: At [facility], you and your health needs are our priority.  As part of our continuing mission to provide you with exceptional heart care, we have created designated Provider Care Teams.  These Care Teams include your primary Cardiologist (physician) and Advanced Practice Providers (APPs -  Physician Assistants and Nurse Practitioners) who all work together to provide you with the care you need, when you need it.  We recommend signing up for the patient portal called "MyChart".  Sign up information is provided on this After Visit Summary.  MyChart is used to connect with patients for Virtual Visits (Telemedicine).  Patients are able to view lab/test results, encounter notes, upcoming appointments, etc.  Non-urgent messages can be sent to your provider as well.   To learn more about what you can do with MyChart, go to [email or web address].    Your next appointment:   2 month(s)  The format for your next appointment:   In Person  Provider:   Shelva Majestic, MD

## 2021-03-17 NOTE — Progress Notes (Signed)
Patient ID: Martin Cordova, male   DOB: 06/16/1940, 80 y.o.   MRN: [id number]      HPI: Martin Cordova is a 80 y.o. male is who presents to the office today for a 3 month follow-up cardiology clinic evaluation.  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 In the past, he developed an obstructive uropathy attributed to kidney stones; creatinine had risen up to 3 and ultimately improved and stabilized at approximately 1.7. Martin Cordova uses his BiPAP daily and does note good sleep.  In the past he had issues with mild peripheral edema, which ultimately improved.  He had been previously taken off his lisinopril when his creatinine had risen in the setting of his obstructive uropathy.  In 2015 he developed recurrent episodes of palpitations and was found to have recurrent paroxysmal atrial fibrillation/flutter.  His medications were adjusted and  he was started on antiarrhythmic therapy with Rythmol to take in addition to his increasing doses of carvedilol.  He was started on Eliquis for anticoagulation.  He underwent successful cardioversion on 08/05/2013.  Since that time, he is unaware of any recurrent atrial fibrillation.  He has noticed more energy. He had been maintained on 50 mg twice a day of carvedilol in addition to Rythmol 225 mg every 8 hours, but due to slow pulse rates, these doses have ultimately been reduced.  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 He has been on CPAP therapy initially for approximately 8 years and has been on BiPAP auto SV for over 4 years. He admits to using his BiPAP with 100% compliance.  He obtained a new BiPAP machine in June.  He feels that his machine is  significantly improved from his prior one. When I last saw him I reviewed his  download of his new BiPAP auto SV machine indicates that 90% of the time is EPAP pressure was 17 and 90% of the time his pressure support was 5.8, giving an IPAP of 21.8 cm.  He is using it 100% of the time and is averaging 5 hours and 49 minutes on his most recent download from 12/07/2013 through 01/05/2014.  His average AHI was 7.5.  His device setting maximum BiPAP pressure is 25 cm in maximum EPAP pressure 21 cm.  His average central event index is 1.3, and average obstructive apneic index 0.2, with an average copy index of 6.0.  Average breath.  Rate is 18 breaths per minute with a minute ventilation of 12.1.  Average total body may 670 mL.  When I saw him on 10/21/15 he was unaware of any arrhythmia.  He was on carvedilol 12.5 mg twice a day, Rythmol 225 mg twice a day, hydralazine 25 mg twice a day and eliquis  5 mg twice a day.  He also has been taking omeprazole for GERD and atorvastatin 10 mg for hyperlipidemia. He was found to have recurrent atrial flutter with variable block.  At that time, I recommended that he increase his Rythmol and take 225 mg every 8 hours and further increased his carvedilol to 18.75 mg twice a day.  Laboratory had revealed a magnesium of 2.0.  He has stage III chronic kidney disease   and his BUN was 26, creatinine 1.71 with a GFR estimate of 39.  Potassium was 4.8.  TSH was normal at 2.4.  Insomnia.  One week later in follow-up he had reverted back to sinus rhythm..  When seen in September 2017, he was maintaining sinus rhythm.  Since then, he admits to weight gain.  In early April, he began to notice some increasing shortness of breath and elevated heart rate and fatigue.  He was seen by Martin Cordova on 09/07/2016 and was found to be back in atrial flutter with a ventricular rate at 90.  He has been on eloquence 5 g twice a day for anticoagulation and was on carvedilol 18.75 twice a day.  Carvedilol  dose was increased to 25 mg twice a day.  He was also told at that time to try increasing his Propofenone to 300 mg 3 times a day.  He took this for several days but did not tolerate the increased dose and reduced it back to its previous dose of 225 mg every 8 hours.  When I saw him on 10/20/2016  I increased carvedilol to 37.5 mg twice a day.  He has continued to use his BiPAP therapy for sleep apnea.    When I saw him in June 2018 he was still in atrial flutter with 3:1 block.  I discontinued hydralazine and started him on Cardizem CD 180 mg, both for blood pressure and potential antiarrhythmic therapy. He also was having ankle edema and I started him on low-dose hydrochlorothiazide.  His peripheral edema significantly improved.  He has felt better.  He denied episodes of chest tightness.  He underwent successful outpatient DC cardioversion by me on 12/26/2016.  He successfully cardioverted at 120 joules.  When he returned for a follow-up evaluation on 01/12/2017 and saw  Martin Cordova, PAC .  He was back in atrial flutter.  At that time his propafenone was discontinued.  Over the past several months, Martin Cordova has felt well.  At times there is some trace swelling in his ankles.  He continues to use his BiPAP with 100% compliance.  He was hospitalized on September 19 with an episode of diverticulitis and was discharged on 02/24/2017.   When I saw him in October 2018, he was back in atrial flutter.  At that time, I recommended consideration for atrial flutter ablation.  He was evaluated by Martin Cordova and on 04/19/2017 underwent successful atrial flutter ablation across the caval tricuspid isthmus, which interrupted bidirectional conduction.  Initial plan was for him to continue anticoagulation for 4 weeks, but unfortunately he developed irregular tachycardia palpitations about 48 hours after the procedure.  He's been found to have paroxysmal atrial fibrillation.  He as result is on chronic anticoagulation  therapy.  In November 2018 an echo Doppler study showed an EF of 40-45%.  He has been on carvedilol 25 mg twice a day and takes an extra carvedilol as needed.  He has had recurrent short burst of atrial fibrillation, but had an episode for almost an entire day.  He received a new BiPAP machine after his previous machine completely malfunction.  He admits to 100% compliance.  [facility] is his DME company.  He has diabetes mellitus and was started back on Tradjenta for hemoglobin A1c of 7.    He has a [employer] BiPAP auto SV unit. I obtained a download from September 02, 2017 through October 01, 2017.  He is compliant.  His average CPAP pressure   was 16.8 and average pressure support pressure was 4.1.  90% of the time device EPAP was 17 cm.  AHI, however, was still mildly increased at 8.9.  He is unaware of breakthrough snoring.  He denies restless legs.  He denies chest pain.  He has noticed some mild episodes of dizziness in the morning.    I recommended some changes to his BiPAP therapy and change his ramp time to 5 minutes, increased but pressure support to 5.5 increased his EPAP to 9 cm water pressure.  A download was obtained from June 12 through December 14, 2017.  He is 100% compliant and averaging his hours and 24 minutes of CPAP use per night.  His average AHI is still slightly elevated 8.2.  90% of time device CPAP was 17 cm water pressure.  Though his pressure support range can from 0 to 5.5 cm, he apparently has only been needing a pressure support of 2.4 cm of water.  He feels well.  He has a F&P medium cushion full facemask but he is an oral breather and when he opens his mouth the facemask is actually too small which may be contributing to some of his increased AHI.  He presents to sleep clinic for further evaluation. He had been evaluated by Dr. Deterding for his renal issues.  He denies any awareness of recurrent atrial flutter.  He has seen Dr. Pickard here he also has been  diagnosed with monoclonal gammopathy of undetermined significance and is monitored by Dr. Kale.  He had undergone a cardiac MRI which was ordered by Martin Cordova last year in February 2019 which showed normal LV size and function with an EF of 54%.  There was no delayed gadolinium uptake on inversion recovery sequences.  He had a trileaflet aortic valve with mild AR, mild MR, moderate RV enlargement, moderate biatrial enlargement and there was moderate LVH with the mid and basal septum measuring 14 mm compared to the posterior wall at 9 mm.  I last saw him in February 2020 at which time he denied any recurrent chest pain or abnormal heart rhythm.  He was using his BiPAP auto SV with 100% compliance.  A download was obtained in the office from January 26 through July 30, 2018.  He is 100% compliant.  He requires high pressures and his average EPAP pressure was 17 which also was his 90% pressure.  He is set up to a potential maximum IPAP pressure of 30.  His pressure support ranges from 0-5.5 with an average pressure support of 2.4 cm.  He is now using a large mask which is much more comfortable and is he is doing better.  On his most recent download, his AHI was still mildly increased at 7.1/h.  He has undergone follow-up hematologic evaluation with Dr. Cale for his monoclonal gammopathy.    I last saw him on April 05, 2019.  Since his prior evaluation he felt that he may have had 3 brief short-lived episodes of PAF which resolved spontaneously.  He denied any associated chest pain. He underwent Mohs surgery for a skin cancer on his chin.  He was unable to use BiPAP for several days since this is where his mask would rest.  I obtained a download from July 1 through April 04, 2019.  His minimum EPAP pressure was 17 with a maximum IPAP pressure of 30 and pressure support of 5.5 cm.  Apparently his ramp start pressure is 9.5 cm.  Average AHI is 6.9.    90% of the time his device EPAP pressure was 17.  Average  breath rate was 16.1 breaths/min.   Echo Doppler study on January 25, 2019 showed an EF of 60 to 65% with mild LVH.  Normal diastolic parameters.  There was moderate aortic sclerosis without stenosis and mild AR.  I last saw him on Oct 08, 2019 at which time he felt well.  He is followed by nephrology with stage IV chronic kidney disease with recent creatinine in February 2021 at 2.59.  He was unaware of any recurrent atrial fibrillation or flutter.  He continues to use BiPAP auto SV therapy with 100% compliance.  He is followed by Dr. Buddy Duty for his diabetes mellitus.   I saw him in November 2021 at which time he remained stable.  He is now followed by Dr. Baird Cancer with the imminent retirement of Dr. Baird Cancer.  Laboratory from April 03, 2020 did show improvement in his creatinine which typically range between 2 and 2.5 and was 1.99.  He denies any significant shortness of breath.  At times he has noticed.  Short brief episodes of possible PAF which self resolved.  His medical regimen has included carvedilol 25 mg twice a day, diltiazem 240 mg daily, furosemide 40 mg for blood pressure and heart rate control.  He is diabetic on Tradjenta.  He has been on Eliquis without bleeding for anticoagulation and continues to be on low-dose atorvastatin and lovaza for hyperlipidemia.  He has a [employer] BiPAP auto SV machine and I discussed with him the recall with a very small risk of Styrofoam breakdown contributed by possible ozone related cleaning cleansing agents.  He had contacted [employer] and is on a list for a new machine.    I saw him for follow-up evaluation on December 04, 2020 and last saw him 3 weeks later on December 21, 2020.  Since his July 1 evaluation he had noticed several months of increased fatigability as well as some mild leg swelling right greater than left.  He denied any chest pain.  He has continued to use his BiPAP ASV.  I obtained a download in the office from June 1 through December 03, 2020 which  shows 100% use.  AHI was 5.8 and is 90% EPAP was 17 and 90% pressure support was 3.3 cm.  He denies any chest pain.  He is unaware of rapid palpitations.  During that evaluation, his ECG confirmed that he was in atrial fibrillation with ventricular rate at 79 with right bundle branch block and PVCs.  I suspected that his atrial fibrillation may have been of several months duration and he had been on diltiazem CD2 140 mg daily, carvedilol 25 mg twice a day, and furosemide 40 mg daily both for rate control, edema and blood pressure control.  With his recurrent atrial fibrillation I recommended institution of amiodarone and started him on 200 mg twice a day.  I discussed potential adverse consequences of amiodarone and the importance of monitoring LFTs and thyroid function.  I scheduled him for laboratory and recommended a follow-up echo Doppler study.  He states he is prescription filled and started his therapy on December 08, 2020.  After several days he seemed to notice slightly more energy.  He is unaware of palpitations.   When I saw him on December 21, 2020, his ECG confirmed that he had converted to sinus rhythm.  At that time, I recommended he continue to take amiodarone 200 mg twice a day with plans to eventually  reduce his dosing.  He underwent a follow-up echo Doppler study on December 23, 2020 which showed normal LV function with EF at 60 to 65%, mild concentric LVH, grade 2 diastolic dysfunction, and there was evidence for moderate left atrial dilatation and mild right atrial dilatation.  There was mild aortic sclerosis without stenosis.  He now sees Dr. Joelyn Oms for his CKD.  Presently, he feels well.  He denies any chest pain or shortness of breath.  He denies any dizziness, presyncope or syncope.  He is seeing Dr. Dennard Schaumann.  He is no longer on Tradjenta but is now on Farxiga for his diabetes mellitus.  He continues to be on Eliquis 5 mg twice a day, carvedilol 25 mg twice a day, diltiazem 240 mg daily as well as  amiodarone 200 mg twice a day.  He has been taking furosemide 40 alternating with 20 mg every other day.  He is on atorvastatin and Lovaza for mixed hyperlipidemia.  He presents for reevaluation.   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 Claiborne Billings   Diabetes mellitus without complication    Renal insufficiency     Cr to 3 on ACE   Nephrolithiasis    Normal coronary arteries 04/06/2007    by cath EF 55%., last ECHO 10/13/10 EF>55% mild MR, last nuc 06/16/10 EF 57% low risk scan   Pulmonary HTN     RV syst.  40-88mHg- moderate by echo   Palpitations     tachy   PAF (paroxysmal atrial fibrillation) 04/09/2013    Past Surgical History  Procedure Laterality Date   Cholecystectomy     Rotator cuff repair     Cystoscopy/retrograde/ureteroscopy  08/19/2011    Procedure: CYSTOSCOPY/RETROGRADE/URETEROSCOPY;  Surgeon: MHanley Ben MD;  Location: [facility]  Service: Urology;  Laterality: Right;  JJ STENT PLACEMENT    Cardiac catheterization  04/06/2007    normal cors    Allergies  Allergen Reactions   Lisinopril     Renal insufficiency    Current Outpatient Medications:    apixaban (ELIQUIS) 5 MG TABS tablet, Take 1 tablet (5 mg total) by mouth 2 (two) times daily., Disp: 180 tablet, Rfl: 1   atorvastatin (LIPITOR) 10 MG tablet, TAKE 1 TABLET BY MOUTH EVERYDAY AT BEDTIME, Disp: 90 tablet, Rfl: 3   carvedilol (COREG) 25 MG tablet, TAKE 1 TABLET BY MOUTH TWICE A DAY WITH MEALS, Disp: 180 tablet, Rfl: 2   dapagliflozin propanediol (FARXIGA) 10 MG TABS tablet, Take 10 mg by mouth daily., Disp: , Rfl:    diltiazem (CARDIZEM CD) 240 MG 24 hr capsule,  TAKE 1 CAPSULE (240 MG TOTAL) BY MOUTH AT BEDTIME., Disp: 90 capsule, Rfl: 3   famotidine (PEPCID) 10 MG tablet, Take 1 tablet by mouth daily., Disp: , Rfl:    fluoruracil (FLUOROPLEX) 1 % cream, Apply topically 2 (two) times daily., Disp: , Rfl:    furosemide (LASIX) 40 MG tablet, Alternate between taking 449m(1 tablet) a day and 2031m1/2 tablet) a day., Disp: 90 tablet, Rfl: 3   omega-3 acid ethyl esters (LOVAZA) 1 g capsule, TAKE 2 CAPSULES BY MOUTH EVERY DAY, Disp: 180 capsule,  Rfl: 3   potassium citrate (UROCIT-K) 10 MEQ (1080 MG) SR tablet, Take 10 mEq by mouth 2 (two) times daily. , Disp: , Rfl:    sildenafil (VIAGRA) 100 MG tablet, Take 0.5-1 tablets (50-100 mg total) by mouth daily as needed for erectile dysfunction., Disp: 5 tablet, Rfl: 11   amiodarone (PACERONE) 200 MG tablet, Take 1.5 tablets (300 mg) daily for 3 weeks, then decrease to 1 tablet (200 mg) daily., Disp: 180 tablet, Rfl: 0  Socially he is married has 4 children and 5 grandchildren. He is a distant relative to the "Naramore brothers." He does try to walk and exercise. There is no tobacco use. He does drink occasional alcohol.   ROS General: Negative; No fevers, chills, or night sweats;  HEENT: Negative; No changes in vision or hearing, sinus congestion, difficulty swallowing Pulmonary: Negative; No cough, wheezing, shortness of breath, hemoptysis Cardiovascular: See history of present illness No recent peripheral edema GI: Status post recent episode of diverticulitis requiring 2 day hospitalization GU:  No dysuria, hematuria, or difficulty voiding; some difficulty with erectile function Musculoskeletal: Negative; no myalgias, joint pain, or weakness Hematologic/Oncology: Positive M spike with monoclonal gammopathy of undetermined significance; history of thrombocytopenia Endocrine: Negative; no heat/cold intolerance; no diabetes Neuro: Negative; no changes in balance, headaches Skin: Negative; No rashes or skin  lesions Psychiatric: Negative; No behavioral problems, depression Sleep: He is using his BiPAP Auto SV therapy with 100% compliance.  No snoring, daytime sleepiness, hypersomnolence, bruxism, restless legs, hypnogognic hallucinations, no cataplexy Other comprehensive 14 point system review is negative.  PE BP 132/70 (BP Location: Right Arm, Patient Position: Sitting, Cuff Size: Large)   Pulse (!) 45   Ht 5' 11" (1.803 m)   Wt 249 lb 12.8 oz (113.3 kg)   SpO2 97%   BMI 34.84 kg/m    Repeat blood pressure by me was 140/70  Wt Readings from Last 3 Encounters:  03/17/21 249 lb 12.8 oz (113.3 kg)  02/09/21 249 lb (112.9 kg)  12/22/20 251 lb (113.9 kg)   General: Alert, oriented, no distress.  Skin: normal turgor, no rashes, warm and dry HEENT: Normocephalic, atraumatic. Pupils equal round and reactive to light; sclera anicteric; extraocular muscles intact;  Nose without nasal septal hypertrophy Mouth/Parynx benign; Mallinpatti scale 3 Neck: No JVD, no carotid bruits; normal carotid upstroke Lungs: clear to ausculatation and percussion; no wheezing or rales Chest wall: without tenderness to palpitation Heart: PMI not displaced, regular rate and rhythm, bradycardic in the upper 40s to 50s., s1 s2 normal, 1/6 systolic murmur, no diastolic murmur, no rubs, gallops, thrills, or heaves Abdomen: soft, nontender; no hepatosplenomehaly, BS+; abdominal aorta nontender and not dilated by palpation. Back: no CVA tenderness Pulses 2+ Musculoskeletal: full range of motion, normal strength, no joint deformities Extremities: no clubbing cyanosis or edema, Homan's sign negative  Neurologic: grossly nonfocal; Cranial nerves grossly wnl Psychologic: Normal mood and affect    March 17, 2021 ECG (independently read by me): SInus bradycardia at 45, 1st degree AV block, PR 216 msec; RBBB  December 21, 2020 ECG (independently read by me): Sinus bradycardia at 55, 1st degree AV block; PR 228 msec; RBBBB    December 04, 2020 ECG (independently read by me): Atrial fibrillation at 79, RBBB, PVC  May 04, 2020 ECG (independently read by me): Sinus bradycardia at 56, PVC, RBBB  May 4, 2021ECG (independently read by me): Normal sinus rhythm at 63 bpm with premature complex.  Right bundle branch block with repolarization changes.    Normal intervals.  April 05, 2019 ECG (independently read by me): Sinus Bradycardia ay 55: RBBB with repolarization.  February 25,2020 ECG (independently read by me): Sinus bradycardia at 56 bpm.  Right bundle branch block with repolarization changes.  October 03, 2017 ECG (independently read by me):  Sinus bradycardia at 50 bpm.  Right bundle branch block with repolarization changes. QTc interval 461 ms.  January 2019 ECG (independently read by me): Sinus bradycardia 56 bpm.  Possible left atrial enlargement.  Right bundle branch block with repolarization changes.  PR interval 186 ms; QTc interval 436 ms  October 2018 ECG (independently read by me): Atrial flutter with variable block at 96 bpm.  Right bundle-branch block with repolarization changes.  QTc interval 437 ms  July 2018 ECG (independently read by me): Probable atrial flutter with a ventricular rate at 82 bpm.  Right bundle-branch block, left anterior hemiblock.  11/08/2016 ECG (independently read by me): Probable atrial flutter 3:1 block , ventricular rate at 85;  right bundle branch block with repolarization changes.  QTc interval 528 ms.   May 2018 ECG (independently read by me): atrial flutter with variable block with ventricular rate at 89 bpm.  Right bundle-branch block, left anterior hemiblock.  September 2017 ECG (independently read by me): Normal sinus rhythm at 63 bpm.  First-degree AV block with a PR interval of 208 ms.  Right bundle branch block with repolarization changes.  QTc interval 466 ms.  10/28/15 ECG (independently read by me): Sinus bradycardia at 54 bpm.  First degree block with PR interval  222 ms.  Right bundle-branch block.  10/21/2015 ECG (independently read by me): Atrial flutter with variable block.  Right bundle-branch block with repolarization changes.  October 2016 ECG (independently read by me): sinus rhythm with first-degree AV block with a PR interval at 216 ms.  Ventricular rate 86 bpm with occasional PVCs.    May 2016 ECG (independently read by me): Normal sinus rhythm at 60 bpm.  Right bundle-branch block with repolarization changes.  November 2015 ECG (independently read by me): Normal sinus rhythm at 61.  Nonspecific T abnormality.  QTc interval 394 ms.  11/18/2013 ECG (independently read by me): Sinus bradycardia 58 beats per minute.  PR interval 198 ms; QTc interval 371 ms.  Nonspecific ST changes.  07/29/2013 ECG  (independently read by me): Atrial flutter with 2:1 block with a ventricular rate of 87 beats per minute. Atrial rate is approximately 360 ms. 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 fib flutter now with right bundle branch block and repolarization changes.  Prior ECG of 06/04/2013: EKG  suggests probable atrial flutter with a ventricular rate of 105 beats per minute. There also are frequent PVCs. QTc interval is 436 ms. They're nonspecific T changes.  LABS: BMP Latest Ref Rng & Units 12/04/2020 05/15/2020 12/20/2019  Glucose 65 - 99 mg/dL 119(H) 156(H) 125(H)  BUN 8 - 27 mg/dL 39(H) 36(H) 34(H)  Creatinine 0.76 - 1.27 mg/dL 2.24(H) 2.57(H) 2.24(H)  BUN/Creat Ratio 10 - 24 17 - 15  Sodium 134 - 144 mmol/L 142 141 142  Potassium 3.5 - 5.2 mmol/L 4.8 4.3 4.2  Chloride 96 - 106 mmol/L 101 105 105  CO2 20 - 29 mmol/L _0 Calcium 8.6 - 10.2 mg/dL 10.5(H) 10.4(H) 9.6   Hepatic Function Latest Ref Rng & Units 12/04/2020 05/15/2020 12/20/2019  Total Protein 6.0 - 8.5 g/dL 7.1 7.3 6.9  Albumin 3.7 -  4.7 g/dL 4.6 4.0 -  AST 0 - 40 IU/L 21 20 18  ALT 0 - 44 IU/L 43 32 26  Alk  Phosphatase 44 - 121 IU/L 51 41 -  Total Bilirubin 0.0 - 1.2 mg/dL 0.8 0.9 0.7  Bilirubin, Direct 0.0 - 0.3 mg/dL - - -   CBC Latest Ref Rng & Units 12/04/2020 05/15/2020 12/20/2019  WBC 3.4 - 10.8 x10E3/uL 5.6 5.1 5.7  Hemoglobin 13.0 - 17.7 g/dL 15.5 15.0 14.9  Hematocrit 37.5 - 51.0 % 46.6 45.3 44.3  Platelets 150 - 450 x10E3/uL 128(L) 113(L) 119(L)   Lab Results  Component Value Date   TSH 2.350 12/04/2020   Lipid Panel     Component Value Date/Time   CHOL 108 12/22/2020 0847   TRIG 153 (H) 12/22/2020 0847   HDL 36 (L) 12/22/2020 0847   CHOLHDL 3.0 12/22/2020 0847   VLDL 37 (H) 10/25/2016 0853   LDLCALC 48 12/22/2020 0847   LDLDIRECT 65.0 04/12/2013 0825     RADIOLOGY: No results found.  IMPRESSION:  1. Paroxysmal atrial fibrillation (HCC)   2. Essential hypertension   3. OSA treated with BiPAP   4. Type 2 diabetes mellitus with chronic kidney disease, without long-term current use of insulin, unspecified CKD stage (HCC)   5. CKD (chronic kidney disease) stage 4, GFR 15-29 ml/min (HCC)   6. Anticoagulation adequate     ASSESSMENT AND PLAN: Martin Cordova is a 80 year-old Caucasian male who has documented normal coronary arteries by cardiac catheterization in 2008. He has a history of hypertension, mixed hyperlipidemia, and has paroxysmal atrial fibrillation/flutter. He has a history of diabetes mellitus and after he had lost a significant amount of weight and with renal insufficiency he was taken off his diabetic medication.  He had been started back on Tradjenta for his diabetes mellitus but since his most recent evaluation this has been discontinued and he is now on Farxiga. He underwent successful catheter ablation for recurrent atrial flutter in November 2018 and remains on long-term anticoagulation with Eliquis.  He denies recurrent arrhythmia.  Dr Cordova had ordered an MRI of his heart in February 2019 which did not reveal any delayed gadolinium uptake arguing against  scar, infiltration or hypertrophic cardiomyopathy.  There was no SAM.  Since his evaluation with me in November 2021, he had noticed more fatigability.  At  recent office visit on July 1 ECG confirmed that he was in atrial fibrillation which I suspect was of several months duration.  At the time he already was on carvedilol 25 mg twice a day, Cardizem CD 240 mg daily, and furosemide 40 mg daily.  I initiated amiodarone 200 mg twice a day which he started on July 10/2020.   When I saw him several weeks later on December 21, 2020 he had felt improved and ECG confirmed that he had not converted to sinus rhythm.  His ECG today continues to show sinus rhythm with sinus bradycardia on current therapy.  I have suggested he now reduce his amiodarone to 300 mg daily and after 3 weeks he will further reduce this to 200 mg daily.  He will continue with his present dose of carvedilol 25 mg twice a day and long-acting diltiazem 240 mg daily.  He sees Dr. Safford for his CKD and on December 04, 2020 at 2.24.  He continues to be on atorvastatin 10 mg and omega-3 fatty acid for mixed hyperlipidemia.  LDL cholesterol was 48 on December 22, 2020.  He   continues to use BiPAP with 100% compliance.  I have recommended in 1 month he undergo follow-up comprehensive metabolic panel and TSH for monitoring of his amiodarone.  I will see him in 2 months for follow-up evaluation.     Thomas Kelly, MD  03/17/2021  1:50 PM      

## 2021-03-25 ENCOUNTER — Other Ambulatory Visit: Payer: Self-pay | Admitting: Cardiovascular Disease

## 2021-03-25 NOTE — Telephone Encounter (Signed)
Prescription refill request for Eliquis received. Indication:Afib Last office visit:10/22 Scr:2.2 Age: 80 Weight:113.3 kg  Prescription refilled

## 2021-04-09 ENCOUNTER — Encounter: Payer: Self-pay | Admitting: Podiatry

## 2021-04-09 ENCOUNTER — Other Ambulatory Visit: Payer: Self-pay

## 2021-04-09 ENCOUNTER — Ambulatory Visit (INDEPENDENT_AMBULATORY_CARE_PROVIDER_SITE_OTHER): Payer: Medicare PPO | Admitting: Podiatry

## 2021-04-09 DIAGNOSIS — D696 Thrombocytopenia, unspecified: Secondary | ICD-10-CM

## 2021-04-09 DIAGNOSIS — E119 Type 2 diabetes mellitus without complications: Secondary | ICD-10-CM

## 2021-04-09 DIAGNOSIS — Z7901 Long term (current) use of anticoagulants: Secondary | ICD-10-CM

## 2021-04-09 DIAGNOSIS — N289 Disorder of kidney and ureter, unspecified: Secondary | ICD-10-CM

## 2021-04-09 DIAGNOSIS — M79675 Pain in left toe(s): Secondary | ICD-10-CM

## 2021-04-09 DIAGNOSIS — B351 Tinea unguium: Secondary | ICD-10-CM | POA: Diagnosis not present

## 2021-04-09 DIAGNOSIS — M79674 Pain in right toe(s): Secondary | ICD-10-CM | POA: Diagnosis not present

## 2021-04-09 NOTE — Progress Notes (Signed)
This patient returns to my office for at risk foot care.  This patient requires this care by a professional since this patient will be at risk due to having diabetes and renal insufficiency and thrombocytopenia..  Patient is taking eliquis.  This patient is unable to cut nails himself since the patient cannot reach his nails.These nails are painful walking and wearing shoes.  This patient presents for at risk foot care today.  General Appearance  Alert, conversant and in no acute stress.  Vascular  Dorsalis pedis and posterior tibial  pulses are palpable  bilaterally.  Capillary return is within normal limits  bilaterally. Temperature is within normal limits  bilaterally.  Neurologic  Senn-Weinstein monofilament wire test within normal limits  bilaterally. Muscle power within normal limits bilaterally.  Nails Thick disfigured discolored nails with subungual debris  from hallux to fifth toes bilaterally. No evidence of bacterial infection or drainage bilaterally.  Orthopedic  No limitations of motion  feet .  No crepitus or effusions noted.  No bony pathology or digital deformities noted.  HAV  B/L.  Skin  normotropic skin with no porokeratosis noted bilaterally.  No signs of infections or ulcers noted.     Onychomycosis  Pain in right toes  Pain in left toes  Consent was obtained for treatment procedures.   Mechanical debridement of nails 1-5  bilaterally performed with a nail nipper.  Filed with dremel without incident.    Return office visit   3 months                   Told patient to return for periodic foot care and evaluation due to potential at risk complications.   Deepa Barthel DPM  

## 2021-04-16 DIAGNOSIS — I480 Paroxysmal atrial fibrillation: Secondary | ICD-10-CM | POA: Diagnosis not present

## 2021-04-17 LAB — COMPREHENSIVE METABOLIC PANEL WITH GFR
ALT: 48 [IU]/L — ABNORMAL HIGH (ref 0–44)
AST: 28 [IU]/L (ref 0–40)
Albumin/Globulin Ratio: 1.6 (ref 1.2–2.2)
Albumin: 4.3 g/dL (ref 3.7–4.7)
Alkaline Phosphatase: 61 [IU]/L (ref 44–121)
BUN/Creatinine Ratio: 13 (ref 10–24)
BUN: 28 mg/dL — ABNORMAL HIGH (ref 8–27)
Bilirubin Total: 0.5 mg/dL (ref 0.0–1.2)
CO2: 25 mmol/L (ref 20–29)
Calcium: 9.8 mg/dL (ref 8.6–10.2)
Chloride: 106 mmol/L (ref 96–106)
Creatinine, Ser: 2.2 mg/dL — ABNORMAL HIGH (ref 0.76–1.27)
Globulin, Total: 2.7 g/dL (ref 1.5–4.5)
Glucose: 126 mg/dL — ABNORMAL HIGH (ref 70–99)
Potassium: 4.9 mmol/L (ref 3.5–5.2)
Sodium: 143 mmol/L (ref 134–144)
Total Protein: 7 g/dL (ref 6.0–8.5)
eGFR: 30 mL/min/{1.73_m2} — ABNORMAL LOW

## 2021-04-17 LAB — CBC
Hematocrit: 43 % (ref 37.5–51.0)
Hemoglobin: 14.1 g/dL (ref 13.0–17.7)
MCH: 30.4 pg (ref 26.6–33.0)
MCHC: 32.8 g/dL (ref 31.5–35.7)
MCV: 93 fL (ref 79–97)
Platelets: 125 10*3/uL — ABNORMAL LOW (ref 150–450)
RBC: 4.64 x10E6/uL (ref 4.14–5.80)
RDW: 13.3 % (ref 11.6–15.4)
WBC: 5.5 10*3/uL (ref 3.4–10.8)

## 2021-04-17 LAB — TSH: TSH: 3.25 u[IU]/mL (ref 0.450–4.500)

## 2021-05-06 DIAGNOSIS — E1122 Type 2 diabetes mellitus with diabetic chronic kidney disease: Secondary | ICD-10-CM | POA: Diagnosis not present

## 2021-05-06 DIAGNOSIS — Q613 Polycystic kidney, unspecified: Secondary | ICD-10-CM | POA: Diagnosis not present

## 2021-05-06 DIAGNOSIS — Z7984 Long term (current) use of oral hypoglycemic drugs: Secondary | ICD-10-CM | POA: Diagnosis not present

## 2021-05-06 DIAGNOSIS — N1832 Chronic kidney disease, stage 3b: Secondary | ICD-10-CM | POA: Diagnosis not present

## 2021-05-06 DIAGNOSIS — E669 Obesity, unspecified: Secondary | ICD-10-CM | POA: Diagnosis not present

## 2021-05-13 ENCOUNTER — Other Ambulatory Visit: Payer: Medicare PPO

## 2021-05-17 ENCOUNTER — Telehealth: Payer: Self-pay | Admitting: Cardiovascular Disease

## 2021-05-17 NOTE — Telephone Encounter (Signed)
Pt has an upcoming appt. On 05/19/21 but pt tested positive for Covid on 05/10/21 and 05/17/21, he still does not have any taste or smell and he also has a stuffy nose. Pt wants to know if he can still come in for his appt this week. Pt does not want to r/s this appt

## 2021-05-17 NOTE — Telephone Encounter (Signed)
Spoke with the patient who states that his symptoms began on 12/4. He states that he has not had a fever. He had a sore throat and congestion. He states that he is not having anymore symptoms other than he cannot taste/smell. Advised patient that he is okay to keep his appointment on 12/14 but to make sure that he wears a mask the entire time. Patient verbalized understanding.

## 2021-05-19 ENCOUNTER — Encounter: Payer: Self-pay | Admitting: Cardiovascular Disease

## 2021-05-19 ENCOUNTER — Ambulatory Visit: Payer: Medicare PPO | Admitting: Cardiovascular Disease

## 2021-05-19 ENCOUNTER — Other Ambulatory Visit: Payer: Self-pay

## 2021-05-19 VITALS — BP 142/72 | HR 53 | Ht 71.0 in | Wt 246.8 lb

## 2021-05-19 DIAGNOSIS — N184 Chronic kidney disease, stage 4 (severe): Secondary | ICD-10-CM

## 2021-05-19 DIAGNOSIS — I480 Paroxysmal atrial fibrillation: Secondary | ICD-10-CM

## 2021-05-19 DIAGNOSIS — Z7901 Long term (current) use of anticoagulants: Secondary | ICD-10-CM | POA: Diagnosis not present

## 2021-05-19 DIAGNOSIS — G4733 Obstructive sleep apnea (adult) (pediatric): Secondary | ICD-10-CM | POA: Diagnosis not present

## 2021-05-19 DIAGNOSIS — I10 Essential (primary) hypertension: Secondary | ICD-10-CM

## 2021-05-19 DIAGNOSIS — E1122 Type 2 diabetes mellitus with diabetic chronic kidney disease: Secondary | ICD-10-CM

## 2021-05-19 NOTE — Progress Notes (Signed)
Patient ID: Martin Cordova, male   DOB: March 25, 1941, 80 y.o.   MRN: [id number]      HPI: Martin Cordova is a 80 y.o. male is who presents to the office today for a 2 month follow-up cardiology clinic evaluation.  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 In the past, he developed an obstructive uropathy attributed to kidney stones; creatinine had risen up to 3 and ultimately improved and stabilized at approximately 1.7. Martin Cordova uses his BiPAP daily and does note good sleep.  In the past he had issues with mild peripheral edema, which ultimately improved.  He had been previously taken off his lisinopril when his creatinine had risen in the setting of his obstructive uropathy.  In 2015 he developed recurrent episodes of palpitations and was found to have recurrent paroxysmal atrial fibrillation/flutter.  His medications were adjusted and  he was started on antiarrhythmic therapy with Rythmol to take in addition to his increasing doses of carvedilol.  He was started on Eliquis for anticoagulation.  He underwent successful cardioversion on 08/05/2013.  Since that time, he is unaware of any recurrent atrial fibrillation.  He has noticed more energy. He had been maintained on 50 mg twice a day of carvedilol in addition to Rythmol 225 mg every 8 hours, but due to slow pulse rates, these doses have ultimately been reduced.  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 He has been on CPAP therapy initially for approximately 8 years and has been on BiPAP auto SV for over 4 years. He admits to using his BiPAP with 100% compliance.  He obtained a new BiPAP machine in June.  He feels that his machine is  significantly improved from his prior one. When I last saw him I reviewed his  download of his new BiPAP auto SV machine indicates that 90% of the time is EPAP pressure was 17 and 90% of the time his pressure support was 5.8, giving an IPAP of 21.8 cm.  He is using it 100% of the time and is averaging 5 hours and 49 minutes on his most recent download from 12/07/2013 through 01/05/2014.  His average AHI was 7.5.  His device setting maximum BiPAP pressure is 25 cm in maximum EPAP pressure 21 cm.  His average central event index is 1.3, and average obstructive apneic index 0.2, with an average copy index of 6.0.  Average breath.  Rate is 18 breaths per minute with a minute ventilation of 12.1.  Average total body may 670 mL.  When I saw him on 10/21/15 he was unaware of any arrhythmia.  He was on carvedilol 12.5 mg twice a day, Rythmol 225 mg twice a day, hydralazine 25 mg twice a day and eliquis  5 mg twice a day.  He also has been taking omeprazole for GERD and atorvastatin 10 mg for hyperlipidemia. He was found to have recurrent atrial flutter with variable block.  At that time, I recommended that he increase his Rythmol and take 225 mg every 8 hours and further increased his carvedilol to 18.75 mg twice a day.  Laboratory had revealed a magnesium of 2.0.  He has stage III chronic kidney disease  and his BUN was 26, creatinine 1.71 with a GFR estimate of 39.  Potassium was 4.8.  TSH was normal at 2.4.  Insomnia.  One week later in follow-up he had reverted back to sinus rhythm..  When seen in September 2017, he was maintaining sinus rhythm.  Since then, he admits to weight gain.  In early April, he began to notice some increasing shortness of breath and elevated heart rate and fatigue.  He was seen by Martin Cordova on 09/07/2016 and was found to be back in atrial flutter with a ventricular rate at 90.  He has been on eloquence 5 g twice a day for anticoagulation and was on carvedilol 18.75 twice a day.  Carvedilol  dose was increased to 25 mg twice a day.  He was also told at that time to try increasing his Propofenone to 300 mg 3 times a day.  He took this for several days but did not tolerate the increased dose and reduced it back to its previous dose of 225 mg every 8 hours.  When I saw him on 10/20/2016  I increased carvedilol to 37.5 mg twice a day.  He has continued to use his BiPAP therapy for sleep apnea.    When I saw him in June 2018 he was still in atrial flutter with 3:1 block.  I discontinued hydralazine and started him on Cardizem CD 180 mg, both for blood pressure and potential antiarrhythmic therapy. He also was having ankle edema and I started him on low-dose hydrochlorothiazide.  His peripheral edema significantly improved.  He has felt better.  He denied episodes of chest tightness.  He underwent successful outpatient DC cardioversion by me on 12/26/2016.  He successfully cardioverted at 120 joules.  When he returned for a follow-up evaluation on 01/12/2017 and saw  Martin Cordova, [facility] .  He was back in atrial flutter.  At that time his propafenone was discontinued.  Over the past several months, Martin Cordova has felt well.  At times there is some trace swelling in his ankles.  He continues to use his BiPAP with 100% compliance.  He was hospitalized on September 19 with an episode of diverticulitis and was discharged on 02/24/2017.   When I saw him in October 2018, he was back in atrial flutter.  At that time, I recommended consideration for atrial flutter ablation.  He was evaluated by Martin Cordova and on 04/19/2017 underwent successful atrial flutter ablation across the caval tricuspid isthmus, which interrupted bidirectional conduction.  Initial plan was for him to continue anticoagulation for 4 weeks, but unfortunately he developed irregular tachycardia palpitations about 48 hours after the procedure.  He's been found to have paroxysmal atrial fibrillation.  He as result is on chronic anticoagulation  therapy.  In November 2018 an echo Doppler study showed an EF of 40-45%.  He has been on carvedilol 25 mg twice a day and takes an extra carvedilol as needed.  He has had recurrent short burst of atrial fibrillation, but had an episode for almost an entire day.  He received a new BiPAP machine after his previous machine completely malfunction.  He admits to 100% compliance.  [facility] [employer] is his DME company.  He has diabetes mellitus and was started back on Tradjenta for hemoglobin A1c of 7.    He has a [employer] BiPAP auto SV unit. I obtained a download from September 02, 2017 through October 01, 2017.  He is compliant.  His average CPAP pressure  was 16.8 and average pressure support pressure was 4.1.  90% of the time device EPAP was 17 cm.  AHI, however, was still mildly increased at 8.9.  He is unaware of breakthrough snoring.  He denies restless legs.  He denies chest pain.  He has noticed some mild episodes of dizziness in the morning.    I recommended some changes to his BiPAP therapy and change his ramp time to 5 minutes, increased but pressure support to 5.5 increased his EPAP to 9 cm water pressure.  A download was obtained from June 12 through December 14, 2017.  He is 100% compliant and averaging his hours and 24 minutes of CPAP use per night.  His average AHI is still slightly elevated 8.2.  90% of time device CPAP was 17 cm water pressure.  Though his pressure support range can from 0 to 5.5 cm, he apparently has only been needing a pressure support of 2.4 cm of water.  He feels well.  He has a F&P medium cushion full facemask but he is an oral breather and when he opens his mouth the facemask is actually too small which may be contributing to some of his increased AHI.  He presents to sleep clinic for further evaluation. He had been evaluated by Dr. Jimmy Footman for his renal issues.  He denies any awareness of recurrent atrial flutter.  He has seen Dr. Dennard Schaumann here he also has been  diagnosed with monoclonal gammopathy of undetermined significance and is monitored by Dr. Irene Limbo.  He had undergone a cardiac MRI which was ordered by Martin Cordova last year in February 2019 which showed normal LV size and function with an EF of 54%.  There was no delayed gadolinium uptake on inversion recovery sequences.  He had a trileaflet aortic valve with mild AR, mild MR, moderate RV enlargement, moderate biatrial enlargement and there was moderate LVH with the mid and basal septum measuring 14 mm compared to the posterior wall at 9 mm.  I last saw him in February 2020 at which time he denied any recurrent chest pain or abnormal heart rhythm.  He was using his BiPAP auto SV with 100% compliance.  A download was obtained in the office from January 26 through July 30, 2018.  He is 100% compliant.  He requires high pressures and his average EPAP pressure was 17 which also was his 90% pressure.  He is set up to a potential maximum IPAP pressure of 30.  His pressure support ranges from 0-5.5 with an average pressure support of 2.4 cm.  He is now using a large mask which is much more comfortable and is he is doing better.  On his most recent download, his AHI was still mildly increased at 7.1/h.  He has undergone follow-up hematologic evaluation with Dr. Velvet Bathe for his monoclonal gammopathy.    I last saw him on April 05, 2019.  Since his prior evaluation he felt that he may have had 3 brief short-lived episodes of PAF which resolved spontaneously.  He denied any associated chest pain. He underwent Mohs surgery for a skin cancer on his chin.  He was unable to use BiPAP for several days since this is where his mask would rest.  I obtained a download from July 1 through April 04, 2019.  His minimum EPAP pressure was 17 with a maximum IPAP pressure of 30 and pressure support of 5.5 cm.  Apparently his ramp start pressure is 9.5 cm.  Average AHI is 6.9.  90% of the time his device EPAP pressure was 17.  Average  breath rate was 16.1 breaths/min.   Echo Doppler study on January 25, 2019 showed an EF of 60 to 65% with mild LVH.  Normal diastolic parameters.  There was moderate aortic sclerosis without stenosis and mild AR.  I saw him on Oct 08, 2019 at which time he felt well.  He is followed by nephrology with stage IV chronic kidney disease with recent creatinine in February 2021 at 2.59.  He was unaware of any recurrent atrial fibrillation or flutter.  He continues to use BiPAP auto SV therapy with 100% compliance.  He is followed by Dr. Buddy Duty for his diabetes mellitus.   I saw him in November 2021 at which time he remained stable.  He is now followed by Dr. Baird Cancer with the imminent retirement of Dr. Baird Cancer.  Laboratory from April 03, 2020 did show improvement in his creatinine which typically range between 2 and 2.5 and was 1.99.  He denies any significant shortness of breath.  At times he has noticed.  Short brief episodes of possible PAF which self resolved.  His medical regimen has included carvedilol 25 mg twice a day, diltiazem 240 mg daily, furosemide 40 mg for blood pressure and heart rate control.  He is diabetic on Tradjenta.  He has been on Eliquis without bleeding for anticoagulation and continues to be on low-dose atorvastatin and lovaza for hyperlipidemia.  He has a [employer] BiPAP auto SV machine and I discussed with him the recall with a very small risk of Styrofoam breakdown contributed by possible ozone related cleaning cleansing agents.  He had contacted [employer] and is on a list for a new machine.    I saw him for follow-up evaluation on December 04, 2020 and last saw him 3 weeks later on December 21, 2020.  Since his July 1 evaluation he had noticed several months of increased fatigability as well as some mild leg swelling right greater than left.  He denied any chest pain.  He has continued to use his BiPAP ASV.  I obtained a download in the office from June 1 through December 03, 2020 which shows  100% use.  AHI was 5.8 and is 90% EPAP was 17 and 90% pressure support was 3.3 cm.  He denies any chest pain.  He is unaware of rapid palpitations.  During that evaluation, his ECG confirmed that he was in atrial fibrillation with ventricular rate at 79 with right bundle branch block and PVCs.  I suspected that his atrial fibrillation may have been of several months duration and he had been on diltiazem CD2 140 mg daily, carvedilol 25 mg twice a day, and furosemide 40 mg daily both for rate control, edema and blood pressure control.  With his recurrent atrial fibrillation I recommended institution of amiodarone and started him on 200 mg twice a day.  I discussed potential adverse consequences of amiodarone and the importance of monitoring LFTs and thyroid function.  I scheduled him for laboratory and recommended a follow-up echo Doppler study.  He states he is prescription filled and started his therapy on December 08, 2020.  After several days he seemed to notice slightly more energy.  He is unaware of palpitations.   When I saw him on December 21, 2020, his ECG confirmed that he had converted to sinus rhythm.  At that time, I recommended he continue to take amiodarone 200 mg twice a day with plans to eventually reduce  his dosing.  He underwent a follow-up echo Doppler study on December 23, 2020 which showed normal LV function with EF at 60 to 65%, mild concentric LVH, grade 2 diastolic dysfunction, and there was evidence for moderate left atrial dilatation and mild right atrial dilatation.  There was mild aortic sclerosis without stenosis.  He now sees Dr. Joelyn Oms for his CKD.  At his March 17, 2021 follow-up evaluation he remained stable and denied any chest pain or shortness of breath, dizziness, presyncope or syncope. He is seeing Dr. Dennard Schaumann.  He is no longer on Tradjenta but is now on Farxiga for his diabetes mellitus.  He continues to be on Eliquis 5 mg twice a day, carvedilol 25 mg twice a day, diltiazem 240 mg  daily as well as amiodarone 200 mg twice a day.  He has been taking furosemide 40 alternating with 20 mg every other day.  He is on atorvastatin and Lovaza for mixed hyperlipidemia.  His ECG revealed sinus bradycardia and I recommended reduction of his amiodarone dose from 40 mg daily down to 300 mg for 3 weeks and further reduction to 200 mg daily.  He continued to be on carvedilol 25 mg twice a day and long-acting diltiazem 240 mg daily.  He sees Dr. Joelyn Oms for his CKD and in July 2022 creatinine had increased to 2.24.  He continues to use BiPAP with 100% compliance.  Presently, he feels well.  He tells me 10 days ago he tested positive for COVID and had mild URI symptoms without fever but did lose his sense of taste for several days.  He presently feels well.  He is unaware of any definitive breakthrough atrial fibrillation but he had noticed transient mild irregularity to heart rhythm last evening.  He continues to be on Eliquis 5 mg twice a day, amiodarone 20 mg daily, carvedilol 25 mg twice a day, diltiazem 240 mg, and has been taking furosemide 40 alternating with 20 mg every other day.  He had undergone recent laboratory on November 11 which showed creatinine at 2.2.  LDL cholesterol in July was 48.  He presents for evaluation.  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 Claiborne Billings   Diabetes mellitus without complication    Renal insufficiency     Cr to 3 on ACE   Nephrolithiasis    Normal coronary arteries 04/06/2007    by cath EF 55%., last ECHO 10/13/10 EF>55% mild MR, last nuc 06/16/10 EF 57% low risk scan   Pulmonary HTN     RV syst.  40-22mHg- moderate by echo    Palpitations     tachy   PAF (paroxysmal atrial fibrillation) 04/09/2013    Past Surgical History  Procedure Laterality Date   Cholecystectomy     Rotator cuff repair     Cystoscopy/retrograde/ureteroscopy  08/19/2011    Procedure: CYSTOSCOPY/RETROGRADE/URETEROSCOPY;  Surgeon: MHanley Ben MD;  Location: [facility]  Service: Urology;  Laterality: Right;  JJ STENT PLACEMENT    Cardiac catheterization  04/06/2007    normal cors    Allergies  Allergen Reactions   Lisinopril     Renal insufficiency    Current  Outpatient Medications:    amiodarone (PACERONE) 200 MG tablet, Take 1.5 tablets (300 mg) daily for 3 weeks, then decrease to 1 tablet (200 mg) daily., Disp: 180 tablet, Rfl: 0   atorvastatin (LIPITOR) 10 MG tablet, TAKE 1 TABLET BY MOUTH EVERYDAY AT BEDTIME, Disp: 90 tablet, Rfl: 3   carvedilol (COREG) 25 MG tablet, TAKE 1 TABLET BY MOUTH TWICE A DAY WITH MEALS, Disp: 180 tablet, Rfl: 2   dapagliflozin propanediol (FARXIGA) 10 MG TABS tablet, Take 10 mg by mouth daily., Disp: , Rfl:    diltiazem (CARDIZEM CD) 240 MG 24 hr capsule, TAKE 1 CAPSULE (240 MG TOTAL) BY MOUTH AT BEDTIME., Disp: 90 capsule, Rfl: 3   ELIQUIS 5 MG TABS tablet, TAKE 1 TABLET BY MOUTH TWICE A DAY, Disp: 180 tablet, Rfl: 1   famotidine (PEPCID) 10 MG tablet, Take 1 tablet by mouth daily., Disp: , Rfl:    furosemide (LASIX) 40 MG tablet, Alternate between taking 61m (1 tablet) a day and 212m(1/2 tablet) a day., Disp: 90 tablet, Rfl: 3   omega-3 acid ethyl esters (LOVAZA) 1 g capsule, TAKE 2 CAPSULES BY MOUTH EVERY DAY, Disp: 180 capsule, Rfl: 3   potassium citrate (UROCIT-K) 10 MEQ (1080 MG) SR tablet, Take 10 mEq by mouth 2 (two) times daily. , Disp: , Rfl:    sildenafil (VIAGRA) 100 MG tablet, Take 0.5-1 tablets (50-100 mg total) by mouth daily as needed for erectile dysfunction., Disp: 5 tablet, Rfl: 11  Socially he is married has 4 children and 5 grandchildren. He is a distant relative  to the "[place] brothers." He does try to walk and exercise. There is no tobacco use. He does drink occasional alcohol.   ROS General: Negative; No fevers, chills, or night sweats;  HEENT: Negative; No changes in vision or hearing, sinus congestion, difficulty swallowing Pulmonary: Negative; No cough, wheezing, shortness of breath, hemoptysis Cardiovascular: See history of present illness No recent peripheral edema GI: Status post recent episode of diverticulitis requiring 2 day hospitalization GU:  No dysuria, hematuria, or difficulty voiding; some difficulty with erectile function Musculoskeletal: Negative; no myalgias, joint pain, or weakness Hematologic/Oncology: Positive M spike with monoclonal gammopathy of undetermined significance; history of thrombocytopenia Endocrine: Negative; no heat/cold intolerance; no diabetes Neuro: Negative; no changes in balance, headaches Skin: Negative; No rashes or skin lesions Psychiatric: Negative; No behavioral problems, depression Sleep: He is using his BiPAP Auto SV therapy with 100% compliance.  No snoring, daytime sleepiness, hypersomnolence, bruxism, restless legs, hypnogognic hallucinations, no cataplexy Other comprehensive 14 point system review is negative.  PE BP (!) 142/72    Pulse (!) 53    Ht _0  (1.803 m)    Wt 246 lb 12.8 oz (111.9 kg)    SpO2 98%    BMI 34.42 kg/m    Repeat blood pressure by me was 138 72  Wt Readings from Last 3 Encounters:  05/19/21 246 lb 12.8 oz (111.9 kg)  03/17/21 249 lb 12.8 oz (113.3 kg)  02/09/21 249 lb (112.9 kg)   General: Alert, oriented, no distress.  Skin: normal turgor, no rashes, warm and dry HEENT: Normocephalic, atraumatic. Pupils equal round and reactive to light; sclera anicteric; extraocular muscles intact; Nose without nasal septal hypertrophy Mouth/Parynx benign; Mallinpatti scale 3 Neck: No JVD, no carotid bruits; normal carotid upstroke Lungs: clear to ausculatation and  percussion; no wheezing or rales Chest wall: without tenderness to palpitation Heart: PMI not displaced, RRR, s1 s2 normal, 1/6 systolic murmur, no diastolic  murmur, no rubs, gallops, thrills, or heaves Abdomen: soft, nontender; no hepatosplenomehaly, BS+; abdominal aorta nontender and not dilated by palpation. Back: no CVA tenderness Pulses 2+ Musculoskeletal: full range of motion, normal strength, no joint deformities Extremities: no clubbing cyanosis or edema, Homan's sign negative  Neurologic: grossly nonfocal; Cranial nerves grossly wnl Psychologic: Normal mood and affect  May 19, 2021 ECG (independently read by me): Sinus bradycardiia at 53, RBBB, LAHB  March 17, 2021 ECG (independently read by me): SInus bradycardia at 45, 1st degree AV block, PR 216 msec; RBBB  December 21, 2020 ECG (independently read by me): Sinus bradycardia at 55, 1st degree AV block; PR 228 msec; RBBBB   December 04, 2020 ECG (independently read by me): Atrial fibrillation at 79, RBBB, PVC  May 04, 2020 ECG (independently read by me): Sinus bradycardia at 56, PVC, RBBB  May 4, 2021ECG (independently read by me): Normal sinus rhythm at 63 bpm with premature complex.  Right bundle branch block with repolarization changes.  Normal intervals.  April 05, 2019 ECG (independently read by me): Sinus Bradycardia ay 55: RBBB with repolarization.  February 25,2020 ECG (independently read by me): Sinus bradycardia at 56 bpm.  Right bundle branch block with repolarization changes.  October 03, 2017 ECG (independently read by me):  Sinus bradycardia at 50 bpm.  Right bundle branch block with repolarization changes. QTc interval 461 ms.  January 2019 ECG (independently read by me): Sinus bradycardia 56 bpm.  Possible left atrial enlargement.  Right bundle branch block with repolarization changes.  PR interval 186 ms; QTc interval 436 ms  October 2018 ECG (independently read by me): Atrial flutter with variable block  at 96 bpm.  Right bundle-branch block with repolarization changes.  QTc interval 437 ms  July 2018 ECG (independently read by me): Probable atrial flutter with a ventricular rate at 82 bpm.  Right bundle-branch block, left anterior hemiblock.  11/08/2016 ECG (independently read by me): Probable atrial flutter 3:1 block , ventricular rate at 85;  right bundle branch block with repolarization changes.  QTc interval 528 ms.   May 2018 ECG (independently read by me): atrial flutter with variable block with ventricular rate at 89 bpm.  Right bundle-branch block, left anterior hemiblock.  September 2017 ECG (independently read by me): Normal sinus rhythm at 63 bpm.  First-degree AV block with a PR interval of 208 ms.  Right bundle branch block with repolarization changes.  QTc interval 466 ms.  10/28/15 ECG (independently read by me): Sinus bradycardia at 54 bpm.  First degree block with PR interval 222 ms.  Right bundle-branch block.  10/21/2015 ECG (independently read by me): Atrial flutter with variable block.  Right bundle-branch block with repolarization changes.  October 2016 ECG (independently read by me): sinus rhythm with first-degree AV block with a PR interval at 216 ms.  Ventricular rate 86 bpm with occasional PVCs.    May 2016 ECG (independently read by me): Normal sinus rhythm at 60 bpm.  Right bundle-branch block with repolarization changes.  November 2015 ECG (independently read by me): Normal sinus rhythm at 61.  Nonspecific T abnormality.  QTc interval 394 ms.  11/18/2013 ECG (independently read by me): Sinus bradycardia 58 beats per minute.  PR interval 198 ms; QTc interval 371 ms.  Nonspecific ST changes.  07/29/2013 ECG  (independently read by me): Atrial flutter with 2:1 block with a ventricular rate of 87 beats per minute. Atrial rate is approximately 360 ms. Right bundle branch block with repolarization  changes  07/08/2013 ECG (independently read by me): Probable A. fib  flutter now with right bundle branch block and repolarization changes.  Prior ECG of 06/04/2013: EKG  suggests probable atrial flutter with a ventricular rate of 105 beats per minute. There also are frequent PVCs. QTc interval is 436 ms. They're nonspecific T changes.  LABS: BMP Latest Ref Rng & Units 04/16/2021 12/04/2020 05/15/2020  Glucose 70 - 99 mg/dL 126(H) 119(H) 156(H)  BUN 8 - 27 mg/dL 28(H) 39(H) 36(H)  Creatinine 0.76 - 1.27 mg/dL 2.20(H) 2.24(H) 2.57(H)  BUN/Creat Ratio 10 - _0 -  Sodium 134 - 144 mmol/L 143 142 141  Potassium 3.5 - 5.2 mmol/L 4.9 4.8 4.3  Chloride 96 - 106 mmol/L 106 101 105  CO2 20 - 29 mmol/L _1 Calcium 8.6 - 10.2 mg/dL 9.8 10.5(H) 10.4(H)   Hepatic Function Latest Ref Rng & Units 04/16/2021 12/04/2020 05/15/2020  Total Protein 6.0 - 8.5 g/dL 7.0 7.1 7.3  Albumin 3.7 - 4.7 g/dL 4.3 4.6 4.0  AST 0 - 40 IU/L _2 ALT 0 - 44 IU/L 48(H) 43 32  Alk Phosphatase 44 - 121 IU/L 61 51 41  Total Bilirubin 0.0 - 1.2 mg/dL 0.5 0.8 0.9  Bilirubin, Direct 0.0 - 0.3 mg/dL - - -   CBC Latest Ref Rng & Units 04/16/2021 12/04/2020 05/15/2020  WBC 3.4 - 10.8 x10E3/uL 5.5 5.6 5.1  Hemoglobin 13.0 - 17.7 g/dL 14.1 15.5 15.0  Hematocrit 37.5 - 51.0 % 43.0 46.6 45.3  Platelets 150 - 450 x10E3/uL 125(L) 128(L) 113(L)   Lab Results  Component Value Date   TSH 3.250 04/16/2021   Lipid Panel     Component Value Date/Time   CHOL 108 12/22/2020 0847   TRIG 153 (H) 12/22/2020 0847   HDL 36 (L) 12/22/2020 0847   CHOLHDL 3.0 12/22/2020 0847   VLDL 37 (H) 10/25/2016 0853   LDLCALC 48 12/22/2020 0847   LDLDIRECT 65.0 04/12/2013 0825     RADIOLOGY: No results found.  IMPRESSION:  1. Essential hypertension   2. Paroxysmal atrial fibrillation (HCC)   3. CKD (chronic kidney disease) stage 4, GFR 15-29 ml/min (HCC)   4. Anticoagulation adequate   5. OSA treated with BiPAP   6. Type 2 diabetes mellitus with chronic kidney disease, without long-term current  use of insulin, unspecified CKD stage [facility])     ASSESSMENT AND PLAN: Martin Cordova is a 80 year-old Caucasian male who has documented normal coronary arteries by cardiac catheterization in 2008. He has a history of hypertension, mixed hyperlipidemia, and has paroxysmal atrial fibrillation/flutter. He has a history of diabetes mellitus and after he had lost a significant amount of weight and with renal insufficiency he was taken off his diabetic medication.  He had been started back on Tradjenta for his diabetes mellitus but since his most recent evaluation this has been discontinued and he is now on [place]. He underwent successful catheter ablation for recurrent atrial flutter in November 2018 and remains on long-term anticoagulation with Eliquis.  He denies recurrent arrhythmia.  Dr [name] had ordered an MRI of his heart in February 2019 which did not reveal any delayed gadolinium uptake arguing against scar, infiltration or hypertrophic cardiomyopathy.  There was no SAM.  Since his evaluation with me in November 2021, he had noticed more fatigability.  At his office visit on July 1 ECG confirmed that he was in atrial fibrillation which I suspect was of several months  duration.  At the time he already was on carvedilol 25 mg twice a day, Cardizem CD 240 mg daily, and furosemide 40 mg daily.  I initiated amiodarone 200 mg twice a day which he started on December 08, 2020.   When I saw him several weeks later on December 21, 2020 he had felt improved and ECG confirmed that he had not converted to sinus rhythm.  At his last evaluation in October 2022 he was maintaining sinus rhythm but was bradycardic with heart rate at 45.  At that time I recommended gradual dose reduction of his amiodarone.  Presently he is now on amiodarone 200 mg daily.  His pulse today is 53.  He feels well.  He tested positive for COVID 10 days ago and had transient URI symptoms without fever but did transiently lose his sense of taste which has  resolved.  He has CKD and most recent creatinine is 2.2 consistent with borderline stage IV CKD.  He is followed by Dr. Joelyn Oms.  He continues to use CPAP with 100% compliance.  He continues to be on atorvastatin for hyperlipidemia and lipid studies are excellent with LDL cholesterol at 48 on December 22, 2020.  He continues to be on Farxiga for diabetes.  I will see him in 4 months for reevaluation or sooner as needed.   Shelva Majestic, MD  05/19/2021  6:40 PM

## 2021-05-19 NOTE — Patient Instructions (Signed)
Medication Instructions:  The current medical regimen is effective;  continue present plan and medications.  *If you need a refill on your cardiac medications before your next appointment, please call your pharmacy*   Follow-Up: At [facility], you and your health needs are our priority.  As part of our continuing mission to provide you with exceptional heart care, we have created designated Provider Care Teams.  These Care Teams include your primary Cardiologist (physician) and Advanced Practice Providers (APPs -  Physician Assistants and Nurse Practitioners) who all work together to provide you with the care you need, when you need it.  We recommend signing up for the patient portal called "MyChart".  Sign up information is provided on this After Visit Summary.  MyChart is used to connect with patients for Virtual Visits (Telemedicine).  Patients are able to view lab/test results, encounter notes, upcoming appointments, etc.  Non-urgent messages can be sent to your provider as well.   To learn more about what you can do with MyChart, go to [email or web address].    Your next appointment:   4 month(s)  The format for your next appointment:   In Person  Provider:   Shelva Majestic, MD

## 2021-05-20 ENCOUNTER — Ambulatory Visit: Payer: Medicare PPO | Admitting: Hematology

## 2021-06-29 DIAGNOSIS — L738 Other specified follicular disorders: Secondary | ICD-10-CM | POA: Diagnosis not present

## 2021-06-29 DIAGNOSIS — D485 Neoplasm of uncertain behavior of skin: Secondary | ICD-10-CM | POA: Diagnosis not present

## 2021-06-29 DIAGNOSIS — L821 Other seborrheic keratosis: Secondary | ICD-10-CM | POA: Diagnosis not present

## 2021-06-29 DIAGNOSIS — L814 Other melanin hyperpigmentation: Secondary | ICD-10-CM | POA: Diagnosis not present

## 2021-06-29 DIAGNOSIS — L570 Actinic keratosis: Secondary | ICD-10-CM | POA: Diagnosis not present

## 2021-06-29 DIAGNOSIS — L565 Disseminated superficial actinic porokeratosis (DSAP): Secondary | ICD-10-CM | POA: Diagnosis not present

## 2021-06-29 DIAGNOSIS — L918 Other hypertrophic disorders of the skin: Secondary | ICD-10-CM | POA: Diagnosis not present

## 2021-06-29 DIAGNOSIS — Z85828 Personal history of other malignant neoplasm of skin: Secondary | ICD-10-CM | POA: Diagnosis not present

## 2021-07-14 ENCOUNTER — Encounter: Payer: Self-pay | Admitting: Podiatry

## 2021-07-14 ENCOUNTER — Ambulatory Visit: Payer: Medicare PPO | Admitting: Podiatry

## 2021-07-14 ENCOUNTER — Other Ambulatory Visit: Payer: Self-pay

## 2021-07-14 DIAGNOSIS — N289 Disorder of kidney and ureter, unspecified: Secondary | ICD-10-CM

## 2021-07-14 DIAGNOSIS — Z7901 Long term (current) use of anticoagulants: Secondary | ICD-10-CM | POA: Diagnosis not present

## 2021-07-14 DIAGNOSIS — M79674 Pain in right toe(s): Secondary | ICD-10-CM | POA: Diagnosis not present

## 2021-07-14 DIAGNOSIS — E119 Type 2 diabetes mellitus without complications: Secondary | ICD-10-CM | POA: Diagnosis not present

## 2021-07-14 DIAGNOSIS — Q613 Polycystic kidney, unspecified: Secondary | ICD-10-CM | POA: Insufficient documentation

## 2021-07-14 DIAGNOSIS — N1832 Chronic kidney disease, stage 3b: Secondary | ICD-10-CM | POA: Insufficient documentation

## 2021-07-14 DIAGNOSIS — D696 Thrombocytopenia, unspecified: Secondary | ICD-10-CM | POA: Diagnosis not present

## 2021-07-14 DIAGNOSIS — M79675 Pain in left toe(s): Secondary | ICD-10-CM | POA: Diagnosis not present

## 2021-07-14 DIAGNOSIS — B351 Tinea unguium: Secondary | ICD-10-CM | POA: Diagnosis not present

## 2021-07-14 NOTE — Progress Notes (Signed)
This patient returns to my office for at risk foot care.  This patient requires this care by a professional since this patient will be at risk due to having diabetes and renal insufficiency and thrombocytopenia..  Patient is taking eliquis.  This patient is unable to cut nails himself since the patient cannot reach his nails.These nails are painful walking and wearing shoes.  This patient presents for at risk foot care today.  General Appearance  Alert, conversant and in no acute stress.  Vascular  Dorsalis pedis and posterior tibial  pulses are palpable  bilaterally.  Capillary return is within normal limits  bilaterally. Temperature is within normal limits  bilaterally.  Neurologic  Senn-Weinstein monofilament wire test within normal limits  bilaterally. Muscle power within normal limits bilaterally.  Nails Thick disfigured discolored nails with subungual debris  from hallux to fifth toes bilaterally. No evidence of bacterial infection or drainage bilaterally.  Orthopedic  No limitations of motion  feet .  No crepitus or effusions noted.  No bony pathology or digital deformities noted.  HAV  B/L.  Skin  normotropic skin with no porokeratosis noted bilaterally.  No signs of infections or ulcers noted.     Onychomycosis  Pain in right toes  Pain in left toes  Consent was obtained for treatment procedures.   Mechanical debridement of nails 1-5  bilaterally performed with a nail nipper.  Filed with dremel without incident.    Return office visit   3 months                   Told patient to return for periodic foot care and evaluation due to potential at risk complications.   Manami Tutor DPM  

## 2021-07-20 ENCOUNTER — Other Ambulatory Visit: Payer: Self-pay | Admitting: Cardiovascular Disease

## 2021-07-22 DIAGNOSIS — Z87442 Personal history of urinary calculi: Secondary | ICD-10-CM | POA: Diagnosis not present

## 2021-07-22 DIAGNOSIS — R350 Frequency of micturition: Secondary | ICD-10-CM | POA: Diagnosis not present

## 2021-07-22 DIAGNOSIS — N5201 Erectile dysfunction due to arterial insufficiency: Secondary | ICD-10-CM | POA: Diagnosis not present

## 2021-09-14 DIAGNOSIS — N183 Chronic kidney disease, stage 3 unspecified: Secondary | ICD-10-CM | POA: Diagnosis not present

## 2021-09-24 DIAGNOSIS — I129 Hypertensive chronic kidney disease with stage 1 through stage 4 chronic kidney disease, or unspecified chronic kidney disease: Secondary | ICD-10-CM | POA: Diagnosis not present

## 2021-09-24 DIAGNOSIS — I4891 Unspecified atrial fibrillation: Secondary | ICD-10-CM | POA: Diagnosis not present

## 2021-09-24 DIAGNOSIS — E1122 Type 2 diabetes mellitus with diabetic chronic kidney disease: Secondary | ICD-10-CM | POA: Diagnosis not present

## 2021-09-24 DIAGNOSIS — N183 Chronic kidney disease, stage 3 unspecified: Secondary | ICD-10-CM | POA: Diagnosis not present

## 2021-09-24 DIAGNOSIS — N2581 Secondary hyperparathyroidism of renal origin: Secondary | ICD-10-CM | POA: Diagnosis not present

## 2021-09-28 ENCOUNTER — Ambulatory Visit: Payer: Medicare PPO | Admitting: Family Medicine

## 2021-09-28 VITALS — BP 142/80 | HR 57 | Temp 98.1°F | Ht 71.0 in | Wt 238.2 lb

## 2021-09-28 DIAGNOSIS — R051 Acute cough: Secondary | ICD-10-CM | POA: Diagnosis not present

## 2021-09-28 MED ORDER — LEVOCETIRIZINE DIHYDROCHLORIDE 5 MG PO TABS: 5.0000 mg | ORAL_TABLET | Freq: Every evening | ORAL | 2 refills | Status: DC

## 2021-09-28 MED ORDER — FLUTICASONE PROPIONATE 50 MCG/ACT NA SUSP: 2.0000 | Freq: Every day | NASAL | 6 refills | Status: AC

## 2021-09-28 NOTE — Progress Notes (Signed)
? ?Subjective:  ? ? Patient ID: Martin Cordova, male    DOB: April 29, 1941, 81 y.o.   MRN: [id number] ? ?HPI ?Patient presents today with a cough for 1 week.  He has no history of allergies.  He denies any chest pain or fever or pleurisy or hemoptysis or purulent sputum.  He has a lot of head congestion and rhinorrhea and postnasal drip.  He denies any otalgia, sore throat, or sinus pain. ? ? ?Past Medical History:  ?Diagnosis Date  ? Cataract   ? Clotting disorder ([place])   ? Colon polyps   ? Diabetes mellitus without complication ([place])   ? Diverticulitis   ? Diverticulosis   ? GERD 12/07/2006  ? HYPERGLYCEMIA 12/07/2006  ? HYPERLIPIDEMIA 12/07/2006  ? mixed wth an atherogenic dyslipidemic pattern  ? HYPERTENSION 12/07/2006  ? Nephrolithiasis   ? Nonalcoholic fatty liver disease   ? Normal coronary arteries 04/06/2007  ? by cath EF 55%., last ECHO 10/13/10 EF>55% mild MR, last nuc 06/16/10 EF 57% low risk scan  ? OBESITY 11/10/2009  ? OSA treated with BiPAP 10/22/2007  ? PAF (paroxysmal atrial fibrillation) ([place]) 04/09/2013  ? Palpitations   ? tachy  ? Pulmonary HTN ([place])   ? RV syst.  40-43mHg- moderate by echo  ? Renal insufficiency   ? Cr to 3 on ACE  ? Sleep apnea   ? SOB (shortness of breath)   ? with exertion  ? THROMBOCYTOPENIA 12/07/2006  ? TRANSAMINASES, SERUM, ELEVATED 12/07/2006  ? 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 monitored by dr. kClaiborne Billings ? ?Past Surgical History:  ?Procedure Laterality Date  ? A-FLUTTER ABLATION N/A 04/19/2017  ? Procedure: A-FLUTTER ABLATION;  Surgeon: KDeboraha Sprang MD;  [facility];  Service: Cardiovascular;  Laterality: N/A;  ? CARDIAC CATHETERIZATION  04/06/2007  ? normal cors  ? CARDIOVERSION N/A 08/05/2013  ? Procedure: CARDIOVERSION;  Surgeon: TTroy Sine MD;  [facility]  Service: Cardiovascular;  Laterality: N/A;  ? CARDIOVERSION N/A 12/26/2016  ? Procedure: CARDIOVERSION;  Surgeon: KTroy Sine MD;  [facility]  Service:  Cardiovascular;  Laterality: N/A;  ? CHOLECYSTECTOMY    ? CYSTOSCOPY/RETROGRADE/URETEROSCOPY  08/19/2011  ? Procedure: CYSTOSCOPY/RETROGRADE/URETEROSCOPY;  Surgeon: MHanley Ben MD;  [facility]  Service: Urology;  Laterality: Right;  JJ STENT PLACEMENT  ? ROTATOR CUFF REPAIR Right   ? ?Current Outpatient Medications on File Prior to Visit  ?Medication Sig Dispense Refill  ? amiodarone (PACERONE) 200 MG tablet Take 1.5 tablets (300 mg) daily for 3 weeks, then decrease to 1 tablet (200 mg) daily. 180 tablet 0  ? atorvastatin (LIPITOR) 10 MG tablet TAKE 1 TABLET BY MOUTH EVERYDAY AT BEDTIME 90 tablet 3  ? carvedilol (COREG) 25 MG tablet TAKE 1 TABLET BY MOUTH TWICE A DAY WITH MEALS 180 tablet 2  ? dapagliflozin propanediol (FARXIGA) 10 MG TABS tablet Take 10 mg by mouth daily.    ? diltiazem (CARDIZEM CD) 240 MG 24 hr capsule TAKE 1 CAPSULE (240 MG TOTAL) BY MOUTH AT BEDTIME. 90 capsule 3  ? ELIQUIS 5 MG TABS tablet TAKE 1 TABLET BY MOUTH TWICE A DAY 180 tablet 1  ? famotidine (PEPCID) 10 MG tablet Take 1 tablet by mouth daily.    ? furosemide (LASIX) 40 MG tablet Alternate between taking '40mg'$  (1 tablet) a day and '20mg'$  (1/2 tablet) a day. 90 tablet 3  ? omega-3 acid ethyl esters (LOVAZA) 1 g capsule TAKE 2 CAPSULES BY  MOUTH EVERY DAY 180 capsule 3  ? potassium citrate (UROCIT-K) 10 MEQ (1080 MG) SR tablet Take 10 mEq by mouth 2 (two) times daily.     ? sildenafil (VIAGRA) 100 MG tablet Take 0.5-1 tablets (50-100 mg total) by mouth daily as needed for erectile dysfunction. 5 tablet 11  ? ?No current facility-administered medications on file prior to visit.  ? ?Allergies  ?Allergen Reactions  ? Lisinopril Swelling and Other (See Comments)  ?  Renal insufficiency also  ? Dulaglutide Other (See Comments)  ? Ibuprofen Other (See Comments)  ?  Per the patient, he has "kidney and liver issues" and is NOT suppose to take this  ? ?Social History  ? ?Socioeconomic History  ? Marital status: Married   ?  Spouse name: Not on file  ? Number of children: 4  ? Years of education: Not on file  ? Highest education level: Not on file  ?Occupational History  ? Occupation: retired Pharmacist, hospital  ?Tobacco Use  ? Smoking status: Former  ?  Years: 10.00  ?  Types: Cigarettes  ?  Quit date: 08/04/1980  ?  Years since quitting: 41.1  ? Smokeless tobacco: Former  ?  Types: Chew  ?  Quit date: 08/08/2004  ?Vaping Use  ? Vaping Use: Never used  ?Substance and Sexual Activity  ? Alcohol use: Not Currently  ? Drug use: No  ? Sexual activity: Yes  ?Other Topics Concern  ? Not on file  ?Social History Narrative  ? Not on file  ? ?Social Determinants of Health  ? ?Financial Resource Strain: Not on file  ?Food Insecurity: Not on file  ?Transportation Needs: Not on file  ?Physical Activity: Not on file  ?Stress: Not on file  ?Social Connections: Not on file  ?Intimate Partner Violence: Not on file  ? ?Family History  ?Problem Relation Age of Onset  ? Diabetes Mother   ? Heart failure Mother   ? Stroke Mother   ? Kidney failure Father   ? Diabetes Father   ? Cancer Paternal Uncle   ? Colon cancer Neg Hx   ? Esophageal cancer Neg Hx   ? Stomach cancer Neg Hx   ? Rectal cancer Neg Hx   ? ? ? ?Review of Systems ? ?   ?Objective:  ? Physical Exam ?Vitals reviewed.  ?Constitutional:   ?   General: He is not in acute distress. ?   Appearance: He is not diaphoretic.  ?HENT:  ?   Head: Normocephalic and atraumatic.  ?   Right Ear: Tympanic membrane, ear canal and external ear normal.  ?   Left Ear: Tympanic membrane, ear canal and external ear normal.  ?   Nose: Congestion and rhinorrhea present.  ?   Mouth/Throat:  ?   Pharynx: No oropharyngeal exudate.  ?Eyes:  ?   General: No scleral icterus.    ?   Right eye: No discharge.     ?   Left eye: No discharge.  ?   Conjunctiva/sclera: Conjunctivae normal.  ?   Pupils: Pupils are equal, round, and reactive to light.  ?Cardiovascular:  ?   Rate and Rhythm: Normal rate. Rhythm irregular.  ?   Heart sounds:  Normal heart sounds. No murmur heard. ?  No friction rub. No gallop.  ?Pulmonary:  ?   Effort: Pulmonary effort is normal. No respiratory distress.  ?   Breath sounds: Normal breath sounds. No stridor. No wheezing or rales.  ?Chest:  ?  Chest wall: No tenderness.  ?Musculoskeletal:  ?   Cervical back: Normal range of motion and neck supple.  ?Lymphadenopathy:  ?   Cervical: No cervical adenopathy.  ?Neurological:  ?   Mental Status: He is alert.  ? ? ? ? ? ?   ?Assessment & Plan:  ?Acute cough ?I believe his cough is due to drainage and postnasal drip.  Given his cardiovascular history I recommended Flonase 2 sprays each nostril daily and Xyzal 5 mg daily to try to help with postnasal drip and the drainage.  I see no evidence of a sinus infection, bronchitis, or pneumonia.  I suspect an upper respiratory infection versus allergies ?

## 2021-10-10 ENCOUNTER — Other Ambulatory Visit: Payer: Self-pay | Admitting: Cardiovascular Disease

## 2021-10-11 NOTE — Telephone Encounter (Signed)
Prescription refill request for Eliquis received. ?Indication:Afib ?Last office visit:12/22 ?Scr:2.2 ?Age: 81 ?Weight:108 kg ? ?Prescription refilled ? ?

## 2021-10-13 ENCOUNTER — Encounter: Payer: Self-pay | Admitting: Podiatry

## 2021-10-13 ENCOUNTER — Ambulatory Visit (INDEPENDENT_AMBULATORY_CARE_PROVIDER_SITE_OTHER): Payer: Medicare PPO | Admitting: Podiatry

## 2021-10-13 DIAGNOSIS — B351 Tinea unguium: Secondary | ICD-10-CM

## 2021-10-13 DIAGNOSIS — M79675 Pain in left toe(s): Secondary | ICD-10-CM | POA: Diagnosis not present

## 2021-10-13 DIAGNOSIS — M79674 Pain in right toe(s): Secondary | ICD-10-CM | POA: Diagnosis not present

## 2021-10-13 DIAGNOSIS — N289 Disorder of kidney and ureter, unspecified: Secondary | ICD-10-CM | POA: Diagnosis not present

## 2021-10-13 DIAGNOSIS — Z7901 Long term (current) use of anticoagulants: Secondary | ICD-10-CM

## 2021-10-13 DIAGNOSIS — E119 Type 2 diabetes mellitus without complications: Secondary | ICD-10-CM

## 2021-10-13 DIAGNOSIS — D696 Thrombocytopenia, unspecified: Secondary | ICD-10-CM | POA: Diagnosis not present

## 2021-10-13 NOTE — Progress Notes (Signed)
This patient returns to my office for at risk foot care.  This patient requires this care by a professional since this patient will be at risk due to having diabetes and renal insufficiency and thrombocytopenia..  Patient is taking eliquis.  This patient is unable to cut nails himself since the patient cannot reach his nails.These nails are painful walking and wearing shoes.  This patient presents for at risk foot care today.  General Appearance  Alert, conversant and in no acute stress.  Vascular  Dorsalis pedis and posterior tibial  pulses are palpable  bilaterally.  Capillary return is within normal limits  bilaterally. Temperature is within normal limits  bilaterally.  Neurologic  Senn-Weinstein monofilament wire test within normal limits  bilaterally. Muscle power within normal limits bilaterally.  Nails Thick disfigured discolored nails with subungual debris  from hallux to fifth toes bilaterally. No evidence of bacterial infection or drainage bilaterally.  Orthopedic  No limitations of motion  feet .  No crepitus or effusions noted.  No bony pathology or digital deformities noted.  HAV  B/L.  Skin  normotropic skin with no porokeratosis noted bilaterally.  No signs of infections or ulcers noted.     Onychomycosis  Pain in right toes  Pain in left toes  Consent was obtained for treatment procedures.   Mechanical debridement of nails 1-5  bilaterally performed with a nail nipper.  Filed with dremel without incident.    Return office visit   3 months                   Told patient to return for periodic foot care and evaluation due to potential at risk complications.   Levonte Molina DPM  

## 2021-10-21 ENCOUNTER — Ambulatory Visit: Payer: Medicare PPO | Admitting: Cardiovascular Disease

## 2021-10-21 NOTE — Progress Notes (Signed)
Cardiology Clinic Note   Patient Name: Martin Cordova Date of Encounter: 10/22/2021  Primary Care Provider:  Susy Frizzle, Cordova Primary Cardiologist:  Martin Majestic, Cordova  Patient Profile    81 year old male patient with history of OSA on BiPAP followed by Martin Cordova, paroxysmal atrial fibrillation/flutter (status post successful catheter ablation November 2018) remains on amiodarone, 200 mg daily, and Eliquis, (which was a dose reduction in amiodarone due to bradycardia)  hyperlipidemia, hypertension, thrombocytopenia, type 2 diabetes, pulmonary hypertension. chronic kidney disease Stage IV  and is followed by nephrology, Martin Cordova.  He is also followed by podiatry Martin Cordova who manages diabetes foot care.  Past Medical History    Past Medical History:  Diagnosis Date   Cataract    Clotting disorder (Martin Cordova)    Colon polyps    Diabetes mellitus without complication ([place])    Diverticulitis    Diverticulosis    GERD 12/07/2006   HYPERGLYCEMIA 12/07/2006   HYPERLIPIDEMIA 12/07/2006   mixed wth an atherogenic dyslipidemic pattern   HYPERTENSION 12/07/2006   Nephrolithiasis    Nonalcoholic fatty liver disease    Normal coronary arteries 04/06/2007   by cath EF 55%., last ECHO 10/13/10 EF>55% mild MR, last nuc 06/16/10 EF 57% low risk scan   OBESITY 11/10/2009   OSA treated with BiPAP 10/22/2007   PAF (paroxysmal atrial fibrillation) ([place]) 04/09/2013   Palpitations    tachy   Pulmonary HTN ([facility])    RV syst.  40-82mHg- moderate by echo   Renal insufficiency    Cr to 3 on ACE   Sleep apnea    SOB (shortness of breath)    with exertion   THROMBOCYTOPENIA 12/07/2006   TRANSAMINASES, SERUM, ELEVATED 12/07/2006   VENTRICULAR TACHYCARDIA 03/27/2007   monitored by Martin Cordova  Past Surgical History:  Procedure Laterality Date   A-FLUTTER ABLATION N/A 04/19/2017   Procedure: A-FLUTTER ABLATION;  Surgeon: Martin Cordova;  [facility];  Service: Cardiovascular;   Laterality: N/A;   CARDIAC CATHETERIZATION  04/06/2007   normal cors   CARDIOVERSION N/A 08/05/2013   Procedure: CARDIOVERSION;  Surgeon: Martin Sine Cordova;  [place] [facility]  Service: Cardiovascular;  Laterality: N/A;   CARDIOVERSION N/A 12/26/2016   Procedure: CARDIOVERSION;  Surgeon: Martin Sine Cordova;  [place] [facility]  Service: Cardiovascular;  Laterality: N/A;   CHOLECYSTECTOMY     CYSTOSCOPY/RETROGRADE/URETEROSCOPY  08/19/2011   Procedure: CYSTOSCOPY/RETROGRADE/URETEROSCOPY;  Surgeon: MHanley Ben Cordova;  [facility]  Service: Urology;  Laterality: Right;  JJ STENT PLACEMENT   ROTATOR CUFF REPAIR Right     Allergies  Allergies  Allergen Reactions   Lisinopril Swelling and Other (See Comments)    Renal insufficiency also   Dulaglutide Other (See Comments)   Ibuprofen Other (See Comments)    Per the patient, he has "kidney and liver issues" and is NOT suppose to take this    History of Present Illness    Martin Cordova today for ongoing assessment and management of paroxysmal atrial fibrillation, hypertension, hyperlipidemia, with normal LVEF per echo.  Last seen by Dr. KClaiborne Billingson 05/19/2021.  Please see his very detailed and thorough note for more information.  On last visit amiodarone was reduced to 200 mg daily due to bradycardia, and remains on Eliquis twice daily.    He comes today with his main complaint being fatigue and shortness of breath.  He states that he has a shop about 150 yards away  from his home and he usually walks there.  He states that he has noticed increasing shortness of breath just walking to the shop.  No associated significant dizziness, chest pressure.  He has been feeling more fatigued.  He has noticed that his heart rate remains in the 50s at home sometimes a little lower, but not less than 40 bpm.  Home Medications    Current Outpatient Medications  Medication Sig Dispense Refill   amiodarone (PACERONE)  200 MG tablet Take 1.5 tablets (300 mg) daily for 3 weeks, then decrease to 1 tablet (200 mg) daily. 180 tablet 0   atorvastatin (LIPITOR) 10 MG tablet TAKE 1 TABLET BY MOUTH EVERYDAY AT BEDTIME 90 tablet 3   carvedilol (COREG) 25 MG tablet TAKE 1 TABLET BY MOUTH TWICE A DAY WITH MEALS 180 tablet 2   dapagliflozin propanediol (FARXIGA) 10 MG TABS tablet Take 10 mg by mouth daily.     diltiazem (CARDIZEM CD) 240 MG 24 hr capsule TAKE 1 CAPSULE (240 MG TOTAL) BY MOUTH AT BEDTIME. 90 capsule 3   ELIQUIS 5 MG TABS tablet TAKE 1 TABLET BY MOUTH TWICE A DAY 180 tablet 1   famotidine (PEPCID) 10 MG tablet Take 1 tablet by mouth daily.     fluticasone (FLONASE) 50 MCG/ACT nasal spray Place 2 sprays into both nostrils daily. 16 g 6   furosemide (LASIX) 40 MG tablet Alternate between taking 40m (1 tablet) a day and 217m(1/2 tablet) a day. 90 tablet 3   levocetirizine (XYZAL ALLERGY 24HR) 5 MG tablet Take 1 tablet (5 mg total) by mouth every evening. 30 tablet 2   omega-3 acid ethyl esters (LOVAZA) 1 g capsule TAKE 2 CAPSULES BY MOUTH EVERY DAY 180 capsule 3   potassium citrate (UROCIT-K) 10 MEQ (1080 MG) SR tablet Take 10 mEq by mouth 2 (two) times daily.      sildenafil (VIAGRA) 100 MG tablet Take 0.5-1 tablets (50-100 mg total) by mouth daily as needed for erectile dysfunction. 5 tablet 11   No current facility-administered medications for this visit.     Family History    Family History  Problem Relation Age of Onset   Diabetes Mother    Heart failure Mother    Stroke Mother    Kidney failure Father    Diabetes Father    Cancer Paternal Uncle    Colon cancer Neg Hx    Esophageal cancer Neg Hx    Stomach cancer Neg Hx    Rectal cancer Neg Hx    He indicated that his mother is deceased. He indicated that his father is deceased. He indicated that his brother is deceased. He indicated that his maternal grandmother is deceased. He indicated that his maternal grandfather is deceased. He  indicated that his paternal grandmother is deceased. He indicated that his paternal grandfather is deceased. He indicated that the status of his paternal uncle is unknown. He indicated that the status of his neg hx is unknown.  Social History    Social History   Socioeconomic History   Marital status: Married    Spouse name: Not on file   Number of children: 4   Years of education: Not on file   Highest education level: Not on file  Occupational History   Occupation: retired tePharmacist, hospitalTobacco Use   Smoking status: Former    Years: 10.00    Types: Cigarettes    Quit date: 08/04/1980    Years since quitting: 41.2   Smokeless  tobacco: Former    Types: Chew    Quit date: 08/08/2004  Vaping Use   Vaping Use: Never used  Substance and Sexual Activity   Alcohol use: Not Currently   Drug use: No   Sexual activity: Yes  Other Topics Concern   Not on file  Social History Narrative   Not on file   Social Determinants of Health   Financial Resource Strain: Not on file  Food Insecurity: Not on file  Transportation Needs: Not on file  Physical Activity: Not on file  Stress: Not on file  Social Connections: Not on file  Intimate Partner Violence: Not on file     Review of Systems    General:  No chills, fever, night sweats or weight changes.  Cardiovascular:  No chest pain, dyspnea on exertion, edema, orthopnea, palpitations, paroxysmal nocturnal dyspnea. Dermatological: No rash, lesions/masses Respiratory: No cough, mild dyspnea Urologic: No hematuria, dysuria Abdominal:   No nausea, vomiting, diarrhea, bright red blood per rectum, melena, or hematemesis Neurologic:  No visual changes, wkns, changes in mental status. All other systems reviewed and are otherwise negative except as noted above.     Physical Exam    VS:  BP 132/70 (BP Location: Left Arm, Patient Position: Sitting, Cuff Size: Normal)   Pulse (!) 50   Ht '5\' 11"'  (1.803 m)   Wt 238 lb (108 kg)   BMI 33.19 kg/m   , BMI Body mass index is 33.19 kg/m.     GEN: Well nourished, well developed, in no acute distress. HEENT: normal. Neck: Supple, no JVD, carotid bruits, or masses. Cardiac: RRR, bradycardic, no murmurs, rubs, or gallops. No clubbing, cyanosis, edema.  Radials/DP/PT 2+ and equal bilaterally.  Respiratory:  Respirations regular and unlabored, clear to auscultation bilaterally. GI: Soft, nontender, nondistended, BS + x 4. MS: no deformity or atrophy. Skin: warm and dry, no rash. Neuro:  Strength and sensation are intact. Psych: Normal affect.  Accessory Clinical Findings    ECG personally reviewed by me today-sinus bradycardia with first-degree AV block, heart rate of 43 bpm, PR interval 214 ms.  Right bundle branch block is noted.- No acute changes  Lab Results  Component Value Date   WBC 5.5 04/16/2021   HGB 14.1 04/16/2021   HCT 43.0 04/16/2021   MCV 93 04/16/2021   PLT 125 (L) 04/16/2021   Lab Results  Component Value Date   CREATININE 2.20 (H) 04/16/2021   BUN 28 (H) 04/16/2021   NA 143 04/16/2021   K 4.9 04/16/2021   CL 106 04/16/2021   CO2 25 04/16/2021   Lab Results  Component Value Date   ALT 48 (H) 04/16/2021   AST 28 04/16/2021   ALKPHOS 61 04/16/2021   BILITOT 0.5 04/16/2021   Lab Results  Component Value Date   CHOL 108 12/22/2020   HDL 36 (L) 12/22/2020   LDLCALC 48 12/22/2020   LDLDIRECT 65.0 04/12/2013   TRIG 153 (H) 12/22/2020   CHOLHDL 3.0 12/22/2020    Lab Results  Component Value Date   HGBA1C 6.5 (H) 12/22/2020    Review of Prior Studies: Echocardiogram 12/23/2020  1. Left ventricular ejection fraction, by estimation, is 60 to 65%. The  left ventricle has normal function. The left ventricle has no regional  wall motion abnormalities. There is mild concentric left ventricular  hypertrophy. Left ventricular diastolic  parameters are consistent with Grade II diastolic dysfunction  (pseudonormalization).   2. Right ventricular systolic  function is normal. The right  ventricular  size is normal. There is normal pulmonary artery systolic pressure.   3. Left atrial size was moderately dilated.   4. Right atrial size was mildly dilated.   5. The mitral valve is normal in structure. Trivial mitral valve  regurgitation. No evidence of mitral stenosis.   6. The aortic valve is tricuspid. There is mild calcification of the  aortic valve. Aortic valve regurgitation is mild. Mild aortic valve  sclerosis is present, with no evidence of aortic valve stenosis.   7. The inferior vena cava is normal in size with greater than 50%  respiratory variability, suggesting right atrial pressure of 3 mmHg.   Cardiac MR  IMPRESSION:/15/2019 1.  Normal LV size and function EF 54%   2.  No delayed gadolinium uptake on inversion recovery sequences   3.  Tri leaflet AV with mild AR   4.  Mild MR   5.  Moderate bi atrial enlargement   6.  Moderate RV enlargement   7. Moderate LVH mid and basal septum 14 mm compared to posterior wall 9 mm   [place]  Assessment & Plan   1.  Paroxysmal atrial fibrillation: Currently in sinus bradycardia, heart rate of 42 bpm.  Had been seen last by Martin Cordova who decreased his amiodarone to 200 mg daily.  Due to bradycardia I have checked in with Martin Cordova to have consultation concerning titration of amiodarone.  After review of the patient's EKG, it was recommended by Martin Cordova to decrease his amiodarone to 200 mg alternating with 100 mg daily.  In the interim I will check a TSH, a c-Met, and a CBC.  He will continue on anticoagulation therapy with Eliquis 5 mg twice daily.  2.  Essential hypertension: He remains on carvedilol 25 mg twice daily, diltiazem 240 mg daily.  Blood pressure is well controlled currently.  Checking labs today.  3.  Chronic kidney disease stage IV: Checking kidney status on Eliquis.  Followed by nephrology.  4.  Type 2 diabetes: Followed by PCP on Farxiga.  5.  OSA on CPAP:  Compliant and continues to follow Martin Cordova for this along with cardiovascular disease.    Current medicines are reviewed at length with the patient today.  I have spent 25 min's  dedicated to the care of this patient on the date of this encounter to include pre-visit review of records, assessment, management and diagnostic testing,with shared decision making. Signed, Phill Myron. West Pugh, ANP, AACC   10/22/2021 12:30 PM    [facility] [address] Office [phone] Fax [phone]  Notice: This dictation was prepared with Dragon dictation along with smaller phrase technology. Any transcriptional errors that result from this process are unintentional and may not be corrected upon review.

## 2021-10-22 ENCOUNTER — Telehealth: Payer: Self-pay

## 2021-10-22 ENCOUNTER — Encounter: Payer: Self-pay | Admitting: Adult Health

## 2021-10-22 ENCOUNTER — Ambulatory Visit: Payer: Medicare PPO | Admitting: Adult Health

## 2021-10-22 VITALS — BP 132/70 | HR 50 | Ht 71.0 in | Wt 238.0 lb

## 2021-10-22 DIAGNOSIS — I10 Essential (primary) hypertension: Secondary | ICD-10-CM | POA: Diagnosis not present

## 2021-10-22 DIAGNOSIS — I480 Paroxysmal atrial fibrillation: Secondary | ICD-10-CM

## 2021-10-22 DIAGNOSIS — N1832 Chronic kidney disease, stage 3b: Secondary | ICD-10-CM | POA: Diagnosis not present

## 2021-10-22 DIAGNOSIS — G4733 Obstructive sleep apnea (adult) (pediatric): Secondary | ICD-10-CM

## 2021-10-22 DIAGNOSIS — E1122 Type 2 diabetes mellitus with diabetic chronic kidney disease: Secondary | ICD-10-CM

## 2021-10-22 LAB — COMPREHENSIVE METABOLIC PANEL
ALT: 37 IU/L (ref 0–44)
AST: 22 IU/L (ref 0–40)
Albumin/Globulin Ratio: 1.9 (ref 1.2–2.2)
Albumin: 4.3 g/dL (ref 3.7–4.7)
Alkaline Phosphatase: 60 IU/L (ref 44–121)
BUN/Creatinine Ratio: 10 (ref 10–24)
BUN: 26 mg/dL (ref 8–27)
Bilirubin Total: 0.6 mg/dL (ref 0.0–1.2)
CO2: 24 mmol/L (ref 20–29)
Calcium: 10 mg/dL (ref 8.6–10.2)
Chloride: 107 mmol/L — ABNORMAL HIGH (ref 96–106)
Creatinine, Ser: 2.69 mg/dL — ABNORMAL HIGH (ref 0.76–1.27)
Globulin, Total: 2.3 g/dL (ref 1.5–4.5)
Glucose: 101 mg/dL — ABNORMAL HIGH (ref 70–99)
Potassium: 5.1 mmol/L (ref 3.5–5.2)
Sodium: 142 mmol/L (ref 134–144)
Total Protein: 6.6 g/dL (ref 6.0–8.5)
eGFR: 23 mL/min/{1.73_m2} — ABNORMAL LOW (ref >59)

## 2021-10-22 LAB — CBC
Hematocrit: 44.6 % (ref 37.5–51.0)
Hemoglobin: 14.9 g/dL (ref 13.0–17.7)
MCH: 30.8 pg (ref 26.6–33.0)
MCHC: 33.4 g/dL (ref 31.5–35.7)
MCV: 92 fL (ref 79–97)
Platelets: 99 10*3/uL — CL (ref 150–450)
RBC: 4.83 x10E6/uL (ref 4.14–5.80)
RDW: 13.3 % (ref 11.6–15.4)
WBC: 5.1 10*3/uL (ref 3.4–10.8)

## 2021-10-22 LAB — TSH: TSH: 3.1 u[IU]/mL (ref 0.450–4.500)

## 2021-10-22 NOTE — Patient Instructions (Addendum)
Medication Instructions:  No changes *If you need a refill on your cardiac medications before your next appointment, please call your pharmacy*   Lab Work: [place]. If you have labs (blood work) drawn today and your tests are completely normal, you will receive your results only by: [place] (if you have MyChart) OR A paper copy in the mail If you have any lab test that is abnormal or we need to change your treatment, we will call you to review the results.   Testing/Procedures:  No Testing   Follow-Up: At [facility], you and your health needs are our priority.  As part of our continuing mission to provide you with exceptional heart care, we have created designated Provider Care Teams.  These Care Teams include your primary Cardiologist (physician) and Advanced Practice Providers (APPs -  Physician Assistants and Nurse Practitioners) who all work together to provide you with the care you need, when you need it.  We recommend signing up for the patient portal called "MyChart".  Sign up information is provided on this After Visit Summary.  MyChart is used to connect with patients for Virtual Visits (Telemedicine).  Patients are able to view lab/test results, encounter notes, upcoming appointments, etc.  Non-urgent messages can be sent to your provider as well.   To learn more about what you can do with MyChart, go to [email or web address].    Your next appointment:   6 month(s)  The format for your next appointment:   In Person  Provider:   Shelva Majestic, MD       Important Information About Sugar

## 2021-10-22 NOTE — Telephone Encounter (Signed)
Called patient regarding taking of Amiodarone. Patient Advised to alternate 200 mg one day and 100 mg on alternate day.

## 2021-10-23 ENCOUNTER — Other Ambulatory Visit: Payer: Self-pay | Admitting: Cardiovascular Disease

## 2021-10-25 ENCOUNTER — Telehealth: Payer: Self-pay

## 2021-10-25 DIAGNOSIS — N183 Chronic kidney disease, stage 3 unspecified: Secondary | ICD-10-CM | POA: Diagnosis not present

## 2021-10-25 NOTE — Telephone Encounter (Addendum)
Called patient regarding results. Patient had understanding of results. Patient stated will schedule appointment with nephrology.----- Message from Lendon Colonel, NP sent at 10/23/2021 12:46 PM EDT ----- I have reviewed labs. His platelets have decreased from prior labs and kidney function as worsened slightly. Please have him follow up with nephrologist soon for recommendations. I calculated dose of Eliquis and he is on appropriate dose. No change in this. TSH is normal.   KL

## 2021-11-04 DIAGNOSIS — E669 Obesity, unspecified: Secondary | ICD-10-CM | POA: Diagnosis not present

## 2021-11-04 DIAGNOSIS — E119 Type 2 diabetes mellitus without complications: Secondary | ICD-10-CM | POA: Diagnosis not present

## 2021-11-04 DIAGNOSIS — Q613 Polycystic kidney, unspecified: Secondary | ICD-10-CM | POA: Diagnosis not present

## 2021-11-04 DIAGNOSIS — N1832 Chronic kidney disease, stage 3b: Secondary | ICD-10-CM | POA: Diagnosis not present

## 2021-11-09 DIAGNOSIS — N183 Chronic kidney disease, stage 3 unspecified: Secondary | ICD-10-CM | POA: Diagnosis not present

## 2021-11-16 ENCOUNTER — Other Ambulatory Visit: Payer: Self-pay | Admitting: Cardiovascular Disease

## 2021-12-12 ENCOUNTER — Other Ambulatory Visit: Payer: Self-pay | Admitting: Cardiovascular Disease

## 2021-12-23 ENCOUNTER — Encounter: Payer: Self-pay | Admitting: Family Medicine

## 2021-12-23 ENCOUNTER — Other Ambulatory Visit: Payer: Self-pay | Admitting: Family Medicine

## 2021-12-23 ENCOUNTER — Ambulatory Visit (INDEPENDENT_AMBULATORY_CARE_PROVIDER_SITE_OTHER): Payer: Medicare PPO | Admitting: Family Medicine

## 2021-12-23 VITALS — BP 140/72 | HR 45 | Ht 71.0 in | Wt 228.4 lb

## 2021-12-23 DIAGNOSIS — N184 Chronic kidney disease, stage 4 (severe): Secondary | ICD-10-CM | POA: Diagnosis not present

## 2021-12-23 DIAGNOSIS — I480 Paroxysmal atrial fibrillation: Secondary | ICD-10-CM | POA: Diagnosis not present

## 2021-12-23 DIAGNOSIS — E785 Hyperlipidemia, unspecified: Secondary | ICD-10-CM | POA: Diagnosis not present

## 2021-12-23 DIAGNOSIS — Z0000 Encounter for general adult medical examination without abnormal findings: Secondary | ICD-10-CM

## 2021-12-23 DIAGNOSIS — E119 Type 2 diabetes mellitus without complications: Secondary | ICD-10-CM

## 2021-12-23 MED ORDER — CARVEDILOL 6.25 MG PO TABS: 6.2500 mg | ORAL_TABLET | Freq: Two times a day (BID) | ORAL | 3 refills | Status: DC

## 2021-12-23 NOTE — Progress Notes (Signed)
Subjective:    Patient ID: Martin Cordova, male    DOB: May 23, 1941, 81 y.o.   MRN: [id number]  HPI  Patient is a very pleasant 81 year old Caucasian male who is here today for complete physical exam.  Past medical history is significant for atrial fibrillation for which she sees cardiology.  He is currently on amiodarone for rhythm control as well as carvedilol and diltiazem for rate control.  He recently had to decrease his carvedilol from 25 to 12.5 mg twice daily due to bradycardia.  He is still in the low 40s.  He is in normal sinus rhythm.  He does report feeling a little lightheaded and fatigued but denies any syncope or near syncope or chest pain or shortness of breath.  His last colonoscopy was last year.  They recommended a repeat colonoscopy in 3 years however at that point he will be 81 years old.  Due to his age, we discussed whether this would be prudent for him and at the present time he is leaning towards not repeating a colonoscopy.  His PSA was normal last year.  Given his age 81 I recommended against repeating a PSA.  His endocrinologist monitors his A1c.  He recently had to switch [place] to [place] due to a rise in his creatinine however he is seeing his nephrologist next week to repeat his BMP.  His most recent creatinine was 2.7.  However since this is being repeated next week I will defer this to his nephrologist to avoid repeated lab draws.  His immunizations are up-to-date except for flu shot in the fall and RSV which we discussed today.  He denies any falls or depression or memory loss. Immunization History  Administered Date(s) Administered   Influenza Split 03/06/2012, 04/10/2017, 02/21/2019, 04/06/2021   Influenza Whole 03/19/2009, 03/06/2010   Influenza, High Dose Seasonal PF 02/08/2016, 04/10/2017, 02/15/2018, 02/21/2019, 02/20/2020   Influenza,inj,Quad PF,6+ Mos 02/23/2013   Influenza-Unspecified 04/06/2014, 03/28/2015, 04/10/2017, 03/12/2021   PFIZER Comirnaty(Gray  Top)Covid-19 Tri-Sucrose Vaccine 09/29/2020   PFIZER(Purple Top)SARS-COV-2 Vaccination 06/26/2019, 07/17/2019, 03/11/2020, 04/06/2020   Pfizer Covid-19 Vaccine Bivalent Booster 99yr & up 04/12/2021, 05/06/2021   Pneumococcal Conjugate-13 06/20/2014   Pneumococcal Polysaccharide-23 06/16/2008   Td 01/25/1999, 07/06/2009   Tdap 06/20/2014   Zoster Recombinat (Shingrix) 12/09/2017, 02/15/2018   Zoster, Live 02/09/2013   No visits with results within 1 Month(s) from this visit.  Latest known visit with results is:  Office Visit on 10/22/2021  Component Date Value Ref Range Status   Glucose 10/22/2021 101 (H)  70 - 99 mg/dL Final   BUN 10/22/2021 26  8 - 27 mg/dL Final   Creatinine, Ser 10/22/2021 2.69 (H)  0.76 - 1.27 mg/dL Final   eGFR 10/22/2021 23 (L)  >59 mL/min/1.73 Final   BUN/Creatinine Ratio 10/22/2021 10  10 - 24 Final   Sodium 10/22/2021 142  134 - 144 mmol/L Final   Potassium 10/22/2021 5.1  3.5 - 5.2 mmol/L Final   Chloride 10/22/2021 107 (H)  96 - 106 mmol/L Final   CO2 10/22/2021 24  20 - 29 mmol/L Final   Calcium 10/22/2021 10.0  8.6 - 10.2 mg/dL Final   Total Protein 10/22/2021 6.6  6.0 - 8.5 g/dL Final   Albumin 10/22/2021 4.3  3.7 - 4.7 g/dL Final   Globulin, Total 10/22/2021 2.3  1.5 - 4.5 g/dL Final   Albumin/Globulin Ratio 10/22/2021 1.9  1.2 - 2.2 Final   Bilirubin Total 10/22/2021 0.6  0.0 - 1.2 mg/dL Final  Alkaline Phosphatase 10/22/2021 60  44 - 121 IU/L Final   AST 10/22/2021 22  0 - 40 IU/L Final   ALT 10/22/2021 37  0 - 44 IU/L Final   WBC 10/22/2021 5.1  3.4 - 10.8 x10E3/uL Final   Comment: White count and platelets may be decreased due to fibrin strands in the sample submitted.    RBC 10/22/2021 4.83  4.14 - 5.80 x10E6/uL Final   Hemoglobin 10/22/2021 14.9  13.0 - 17.7 g/dL Final   Hematocrit 10/22/2021 44.6  37.5 - 51.0 % Final   MCV 10/22/2021 92  79 - 97 fL Final   MCH 10/22/2021 30.8  26.6 - 33.0 pg Final   MCHC 10/22/2021 33.4  31.5 - 35.7  g/dL Final   RDW 10/22/2021 13.3  11.6 - 15.4 % Final   Platelets 10/22/2021 99 (LL)  150 - 450 x10E3/uL Final   Comment: Actual platelet count may be somewhat higher than reported due to aggregation of platelets in this sample.    Hematology Comments: 10/22/2021 Note:   Final   Verified by microscopic examination.   TSH 10/22/2021 3.100  0.450 - 4.500 uIU/mL Final    Past Medical History:  Diagnosis Date   Allergy    Arthritis    Cataract    Clotting disorder ([place])    Colon polyps    Diabetes mellitus without complication ([place])    Diverticulitis    Diverticulosis    GERD 12/07/2006   HYPERGLYCEMIA 12/07/2006   HYPERLIPIDEMIA 12/07/2006   mixed wth an atherogenic dyslipidemic pattern   HYPERTENSION 12/07/2006   Nephrolithiasis    Nonalcoholic fatty liver disease    Normal coronary arteries 04/06/2007   by cath EF 55%., last ECHO 10/13/10 EF>55% mild MR, last nuc 06/16/10 EF 57% low risk scan   OBESITY 11/10/2009   OSA treated with BiPAP 10/22/2007   PAF (paroxysmal atrial fibrillation) ([place]) 04/09/2013   Palpitations    tachy   Pulmonary HTN (HCC)    RV syst.  40-22mHg- moderate by echo   Renal insufficiency    Cr to 3 on ACE   Sleep apnea    SOB (shortness of breath)    with exertion   THROMBOCYTOPENIA 12/07/2006   TRANSAMINASES, SERUM, ELEVATED 12/07/2006   VENTRICULAR TACHYCARDIA 03/27/2007   monitored by dr. kClaiborne Billings  Past Surgical History:  Procedure Laterality Date   A-FLUTTER ABLATION N/A 04/19/2017   Procedure: A-FLUTTER ABLATION;  Surgeon: KDeboraha Sprang MD;  [facility];  Service: Cardiovascular;  Laterality: N/A;   CARDIAC CATHETERIZATION  04/06/2007   normal cors   CARDIOVERSION N/A 08/05/2013   Procedure: CARDIOVERSION;  Surgeon: TTroy Sine MD;  [facility]  Service: Cardiovascular;  Laterality: N/A;   CARDIOVERSION N/A 12/26/2016   Procedure: CARDIOVERSION;  Surgeon: KTroy Sine MD;  [facility]   Service: Cardiovascular;  Laterality: N/A;   CHOLECYSTECTOMY     CYSTOSCOPY/RETROGRADE/URETEROSCOPY  08/19/2011   Procedure: CYSTOSCOPY/RETROGRADE/URETEROSCOPY;  Surgeon: MHanley Ben MD;  [facility]  Service: Urology;  Laterality: Right;  JJ STENT PLACEMENT   EYE SURGERY     ROTATOR CUFF REPAIR Right    Current Outpatient Medications on File Prior to Visit  Medication Sig Dispense Refill   amiodarone (PACERONE) 200 MG tablet TAKE 1 & 1/2 TABLETS (300 MG) DAILY FOR 3 WEEKS, THEN DECREASE TO 1 TABLET (200 MG) DAILY. 180 tablet 2   atorvastatin (LIPITOR) 10 MG tablet TAKE 1 TABLET BY  MOUTH EVERYDAY AT BEDTIME 90 tablet 3   diltiazem (CARDIZEM CD) 240 MG 24 hr capsule TAKE 1 CAPSULE BY MOUTH AT BEDTIME 90 capsule 1   ELIQUIS 5 MG TABS tablet TAKE 1 TABLET BY MOUTH TWICE A DAY 180 tablet 1   empagliflozin (JARDIANCE) 10 MG TABS tablet Take 10 mg by mouth daily.     famotidine (PEPCID) 10 MG tablet Take 1 tablet by mouth daily.     fluticasone (FLONASE) 50 MCG/ACT nasal spray Place 2 sprays into both nostrils daily. 16 g 6   furosemide (LASIX) 40 MG tablet Alternate between taking 66m (1 tablet) a day and 227m(1/2 tablet) a day. 90 tablet 3   omega-3 acid ethyl esters (LOVAZA) 1 g capsule TAKE 2 CAPSULES BY MOUTH EVERY DAY 180 capsule 3   potassium citrate (UROCIT-K) 10 MEQ (1080 MG) SR tablet Take 10 mEq by mouth 2 (two) times daily.      sildenafil (VIAGRA) 100 MG tablet Take 0.5-1 tablets (50-100 mg total) by mouth daily as needed for erectile dysfunction. 5 tablet 11   dapagliflozin propanediol (FARXIGA) 10 MG TABS tablet Take 10 mg by mouth daily. (Patient not taking: Reported on 12/23/2021)     No current facility-administered medications on file prior to visit.   Allergies  Allergen Reactions   Lisinopril Swelling and Other (See Comments)    Renal insufficiency also   Dulaglutide Other (See Comments)   Ibuprofen Other (See Comments)    Per the patient,  he has "kidney and liver issues" and is NOT suppose to take this   Social History   Socioeconomic History   Marital status: Married    Spouse name: Not on file   Number of children: 4   Years of education: Not on file   Highest education level: Not on file  Occupational History   Occupation: retired tePharmacist, hospitalTobacco Use   Smoking status: Former    Packs/day: 0.25    Years: 10.00    Total pack years: 2.50    Types: Cigarettes    Quit date: 08/04/1980    Years since quitting: 41.4   Smokeless tobacco: Former    Types: Chew    Quit date: 08/08/2004  Vaping Use   Vaping Use: Never used  Substance and Sexual Activity   Alcohol use: Not Currently   Drug use: No   Sexual activity: Yes  Other Topics Concern   Not on file  Social History Narrative   Not on file   Social Determinants of Health   Financial Resource Strain: Not on file  Food Insecurity: Not on file  Transportation Needs: Not on file  Physical Activity: Not on file  Stress: Not on file  Social Connections: Not on file  Intimate Partner Violence: Not on file   Family History  Problem Relation Age of Onset   Diabetes Mother    Heart failure Mother    Stroke Mother    Kidney failure Father    Diabetes Father    Cancer Paternal Uncle    Colon cancer Neg Hx    Esophageal cancer Neg Hx    Stomach cancer Neg Hx    Rectal cancer Neg Hx      Review of Systems     Objective:   Physical Exam Vitals reviewed.  Constitutional:      General: He is not in acute distress.    Appearance: He is not diaphoretic.  HENT:     Head: Normocephalic and atraumatic.  Right Ear: External ear normal.     Left Ear: External ear normal.     Nose: Nose normal.     Mouth/Throat:     Pharynx: No oropharyngeal exudate.  Eyes:     General: No scleral icterus.       Right eye: No discharge.        Left eye: No discharge.     Conjunctiva/sclera: Conjunctivae normal.     Pupils: Pupils are equal, round, and reactive to  light.  Cardiovascular:     Rate and Rhythm: Normal rate. Rhythm irregular.     Heart sounds: Normal heart sounds. No murmur heard.    No friction rub. No gallop.  Pulmonary:     Effort: Pulmonary effort is normal. No respiratory distress.     Breath sounds: Normal breath sounds. No stridor. No wheezing or rales.  Chest:     Chest wall: No tenderness.  Abdominal:     General: Bowel sounds are normal. There is no distension.     Palpations: Abdomen is soft. There is no mass.     Tenderness: There is no abdominal tenderness. There is no guarding or rebound.  Musculoskeletal:        General: No tenderness. Normal range of motion.     Cervical back: Normal range of motion and neck supple.  Lymphadenopathy:     Cervical: No cervical adenopathy.  Skin:    General: Skin is warm.     Coloration: Skin is not pale.     Findings: No erythema or rash.  Neurological:     Mental Status: He is alert and oriented to person, place, and time.     Cranial Nerves: No cranial nerve deficit.     Coordination: Coordination normal.  Psychiatric:        Behavior: Behavior normal.        Thought Content: Thought content normal.        Judgment: Judgment normal.           Assessment & Plan:  Stage 4 chronic kidney disease ([place]) - Plan: CANCELED: BASIC METABOLIC PANEL WITH GFR  General medical exam  Controlled type 2 diabetes mellitus without complication, without long-term current use of insulin (HCC)  PAF (paroxysmal atrial fibrillation) ([place])  Hyperlipidemia LDL goal <70 Labs were just checked in May.  His nephrologist is repeating a BMP next week to monitor his creatinine.  Therefore I will not repeat this.  His endocrinologist is monitoring his A1c and his TSH.  These were most recently normal.  I will decrease his carvedilol to 6.125 twice daily due to bradycardia.  He will notify me how he is doing and if not improving we will decrease Cardizem.  Recommended RSV vaccination and a flu shot  in the fall but he gets this at his pharmacy.  Recommended against a PSA due to his age.  Not due for another colonoscopy until 83 and we will "cross that bridge when we get there".

## 2021-12-28 DIAGNOSIS — N183 Chronic kidney disease, stage 3 unspecified: Secondary | ICD-10-CM | POA: Diagnosis not present

## 2022-01-05 DIAGNOSIS — I4891 Unspecified atrial fibrillation: Secondary | ICD-10-CM | POA: Diagnosis not present

## 2022-01-05 DIAGNOSIS — N183 Chronic kidney disease, stage 3 unspecified: Secondary | ICD-10-CM | POA: Diagnosis not present

## 2022-01-05 DIAGNOSIS — N2581 Secondary hyperparathyroidism of renal origin: Secondary | ICD-10-CM | POA: Diagnosis not present

## 2022-01-05 DIAGNOSIS — E1122 Type 2 diabetes mellitus with diabetic chronic kidney disease: Secondary | ICD-10-CM | POA: Diagnosis not present

## 2022-01-05 DIAGNOSIS — I129 Hypertensive chronic kidney disease with stage 1 through stage 4 chronic kidney disease, or unspecified chronic kidney disease: Secondary | ICD-10-CM | POA: Diagnosis not present

## 2022-01-14 ENCOUNTER — Encounter: Payer: Self-pay | Admitting: Podiatry

## 2022-01-14 ENCOUNTER — Ambulatory Visit: Payer: Medicare PPO | Admitting: Podiatry

## 2022-01-14 DIAGNOSIS — B351 Tinea unguium: Secondary | ICD-10-CM | POA: Diagnosis not present

## 2022-01-14 DIAGNOSIS — E119 Type 2 diabetes mellitus without complications: Secondary | ICD-10-CM

## 2022-01-14 DIAGNOSIS — Z7901 Long term (current) use of anticoagulants: Secondary | ICD-10-CM

## 2022-01-14 DIAGNOSIS — M79675 Pain in left toe(s): Secondary | ICD-10-CM | POA: Diagnosis not present

## 2022-01-14 DIAGNOSIS — N289 Disorder of kidney and ureter, unspecified: Secondary | ICD-10-CM

## 2022-01-14 DIAGNOSIS — D696 Thrombocytopenia, unspecified: Secondary | ICD-10-CM

## 2022-01-14 DIAGNOSIS — M79674 Pain in right toe(s): Secondary | ICD-10-CM | POA: Diagnosis not present

## 2022-01-14 NOTE — Progress Notes (Signed)
This patient returns to my office for at risk foot care.  This patient requires this care by a professional since this patient will be at risk due to having diabetes and renal insufficiency and thrombocytopenia..  Patient is taking eliquis.  This patient is unable to cut nails himself since the patient cannot reach his nails.These nails are painful walking and wearing shoes.  This patient presents for at risk foot care today.  General Appearance  Alert, conversant and in no acute stress.  Vascular  Dorsalis pedis and posterior tibial  pulses are palpable  bilaterally.  Capillary return is within normal limits  bilaterally. Temperature is within normal limits  bilaterally.  Neurologic  Senn-Weinstein monofilament wire test within normal limits  bilaterally. Muscle power within normal limits bilaterally.  Nails Thick disfigured discolored nails with subungual debris  from hallux to fifth toes bilaterally. No evidence of bacterial infection or drainage bilaterally.  Orthopedic  No limitations of motion  feet .  No crepitus or effusions noted.  No bony pathology or digital deformities noted.  HAV  B/L.  Skin  normotropic skin with no porokeratosis noted bilaterally.  No signs of infections or ulcers noted.     Onychomycosis  Pain in right toes  Pain in left toes  Consent was obtained for treatment procedures.   Mechanical debridement of nails 1-5  bilaterally performed with a nail nipper.  Filed with dremel without incident.    Return office visit   3 months                   Told patient to return for periodic foot care and evaluation due to potential at risk complications.   Kobi Mario DPM  

## 2022-02-04 ENCOUNTER — Encounter: Payer: Self-pay | Admitting: Family Medicine

## 2022-03-12 ENCOUNTER — Other Ambulatory Visit: Payer: Self-pay | Admitting: Cardiovascular Disease

## 2022-03-12 DIAGNOSIS — I480 Paroxysmal atrial fibrillation: Secondary | ICD-10-CM

## 2022-03-14 NOTE — Telephone Encounter (Signed)
Prescription refill request for Eliquis received. Indication: atrial fibriliation   Last office visit:10/22/2021 [facility]) (Dr. Claiborne Billings) Scr: 2.69 (10/22/2021) Age: 81 Weight:103.6 kg CrCl: 27 ml/min (Adj BW)   Appropriate dose. Refilled rx for 90 days with 1 refill and sent to the pharmacy.

## 2022-04-04 DIAGNOSIS — Z961 Presence of intraocular lens: Secondary | ICD-10-CM | POA: Diagnosis not present

## 2022-04-04 DIAGNOSIS — H40023 Open angle with borderline findings, high risk, bilateral: Secondary | ICD-10-CM | POA: Diagnosis not present

## 2022-04-04 DIAGNOSIS — H52203 Unspecified astigmatism, bilateral: Secondary | ICD-10-CM | POA: Diagnosis not present

## 2022-04-04 DIAGNOSIS — E119 Type 2 diabetes mellitus without complications: Secondary | ICD-10-CM | POA: Diagnosis not present

## 2022-04-04 LAB — HM DIABETES EYE EXAM

## 2022-04-06 ENCOUNTER — Encounter: Payer: Self-pay | Admitting: Family Medicine

## 2022-04-11 ENCOUNTER — Encounter: Payer: Self-pay | Admitting: Cardiovascular Disease

## 2022-04-11 ENCOUNTER — Ambulatory Visit: Payer: Medicare PPO | Attending: Cardiovascular Disease | Admitting: Cardiovascular Disease

## 2022-04-11 VITALS — BP 130/72 | HR 48 | Ht 71.0 in | Wt 227.8 lb

## 2022-04-11 DIAGNOSIS — I4510 Unspecified right bundle-branch block: Secondary | ICD-10-CM

## 2022-04-11 DIAGNOSIS — Z7901 Long term (current) use of anticoagulants: Secondary | ICD-10-CM

## 2022-04-11 DIAGNOSIS — N184 Chronic kidney disease, stage 4 (severe): Secondary | ICD-10-CM | POA: Diagnosis not present

## 2022-04-11 DIAGNOSIS — I10 Essential (primary) hypertension: Secondary | ICD-10-CM

## 2022-04-11 DIAGNOSIS — E785 Hyperlipidemia, unspecified: Secondary | ICD-10-CM

## 2022-04-11 DIAGNOSIS — G4733 Obstructive sleep apnea (adult) (pediatric): Secondary | ICD-10-CM | POA: Diagnosis not present

## 2022-04-11 DIAGNOSIS — I480 Paroxysmal atrial fibrillation: Secondary | ICD-10-CM

## 2022-04-11 DIAGNOSIS — E1122 Type 2 diabetes mellitus with diabetic chronic kidney disease: Secondary | ICD-10-CM

## 2022-04-11 MED ORDER — CARVEDILOL 12.5 MG PO TABS: 12.5000 mg | ORAL_TABLET | Freq: Two times a day (BID) | ORAL | 6 refills | Status: DC

## 2022-04-11 MED ORDER — HYDROCHLOROTHIAZIDE 12.5 MG PO CAPS: 12.5000 mg | ORAL_CAPSULE | Freq: Every day | ORAL | 4 refills | Status: DC

## 2022-04-11 NOTE — Patient Instructions (Addendum)
Medication Instructions:  TAKE CARVEDILOL 12.5 MG IN AND AM AND 6.25 MG IN THE PM  START HYDRALAZINE 12.50 MG TWICE DAILY *If you need a refill on your cardiac medications before your next appointment, please call your pharmacy*   Lab Work: NONE  Testing/Procedures: NONE   Follow-Up: At [facility], you and your health needs are our priority.  As part of our continuing mission to provide you with exceptional heart care, we have created designated Provider Care Teams.  These Care Teams include your primary Cardiologist (physician) and Advanced Practice Providers (APPs -  Physician Assistants and Nurse Practitioners) who all work together to provide you with the care you need, when you need it.  We recommend signing up for the patient portal called "MyChart".  Sign up information is provided on this After Visit Summary.  MyChart is used to connect with patients for Virtual Visits (Telemedicine).  Patients are able to view lab/test results, encounter notes, upcoming appointments, etc.  Non-urgent messages can be sent to your provider as well.   To learn more about what you can do with MyChart, go to [email or web address].    Your next appointment:   IN MARCH  2024  The format for your next appointment:   In Person  Provider:   Shelva Majestic, MD     Other Instructions MAKE [place]

## 2022-04-11 NOTE — Progress Notes (Signed)
Patient ID: Martin Cordova, male   DOB: Mar 13, 1941, 81 y.o.   MRN: [id number]       HPI: Martin Cordova is a 81 y.o. male is who presents to the office today for an 62 month follow-up cardiology clinic evaluation.  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 In the past, he developed an obstructive uropathy attributed to kidney stones; creatinine had risen up to 3 and ultimately improved and stabilized at approximately 1.7. Martin Cordova uses his BiPAP daily and does note good sleep.  In the past he had issues with mild peripheral edema, which ultimately improved.  He had been previously taken off his lisinopril when his creatinine had risen in the setting of his obstructive uropathy.  In 2015 he developed recurrent episodes of palpitations and was found to have recurrent paroxysmal atrial fibrillation/flutter.  His medications were adjusted and  he was started on antiarrhythmic therapy with Rythmol to take in addition to his increasing doses of carvedilol.  He was started on Eliquis for anticoagulation.  He underwent successful cardioversion on 08/05/2013.  Since that time, he is unaware of any recurrent atrial fibrillation.  He has noticed more energy. He had been maintained on 50 mg twice a day of carvedilol in addition to Rythmol 225 mg every 8 hours, but due to slow pulse rates, these doses have ultimately been reduced.  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 He has been on CPAP therapy initially for approximately 8 years and has been on BiPAP auto SV for over 4 years. He admits to using his BiPAP with 100% compliance.  He obtained a new BiPAP machine in June.  He feels that his machine is  significantly improved from his prior one. When I last saw him I reviewed his  download of his new BiPAP auto SV machine indicates that 90% of the time is EPAP pressure was 17 and 90% of the time his pressure support was 5.8, giving an IPAP of 21.8 cm.  He is using it 100% of the time and is averaging 5 hours and 49 minutes on his most recent download from 12/07/2013 through 01/05/2014.  His average AHI was 7.5.  His device setting maximum BiPAP pressure is 25 cm in maximum EPAP pressure 21 cm.  His average central event index is 1.3, and average obstructive apneic index 0.2, with an average copy index of 6.0.  Average breath.  Rate is 18 breaths per minute with a minute ventilation of 12.1.  Average total body may 670 mL.  When I saw him on 10/21/15 he was unaware of any arrhythmia.  He was on carvedilol 12.5 mg twice a day, Rythmol 225 mg twice a day, hydralazine 25 mg twice a day and eliquis  5 mg twice a day.  He also has been taking omeprazole for GERD and atorvastatin 10 mg for hyperlipidemia. He was found to have recurrent atrial flutter with variable block.  At that time, I recommended that he increase his Rythmol and take 225 mg every 8 hours and further increased his carvedilol to 18.75 mg twice a day.  Laboratory had revealed a magnesium of 2.0.  He has stage III chronic kidney  disease and his BUN was 26, creatinine 1.71 with a GFR estimate of 39.  Potassium was 4.8.  TSH was normal at 2.4.  Insomnia.  One week later in follow-up he had reverted back to sinus rhythm..  When seen in September 2017, he was maintaining sinus rhythm.  Since then, he admits to weight gain.  In early April, he began to notice some increasing shortness of breath and elevated heart rate and fatigue.  He was seen by [name] on 09/07/2016 and was found to be back in atrial flutter with a ventricular rate at 90.  He has been on eloquence 5 g twice a day for anticoagulation and was on carvedilol 18.75 twice a day.  Carvedilol  dose was increased to 25 mg twice a day.  He was also told at that time to try increasing his Propofenone to 300 mg 3 times a day.  He took this for several days but did not tolerate the increased dose and reduced it back to its previous dose of 225 mg every 8 hours.  When I saw him on 10/20/2016  I increased carvedilol to 37.5 mg twice a day.  He has continued to use his BiPAP therapy for sleep apnea.    When I saw him in June 2018 he was still in atrial flutter with 3:1 block.  I discontinued hydralazine and started him on Cardizem CD 180 mg, both for blood pressure and potential antiarrhythmic therapy. He also was having ankle edema and I started him on low-dose hydrochlorothiazide.  His peripheral edema significantly improved.  He has felt better.  He denied episodes of chest tightness.  He underwent successful outpatient DC cardioversion by me on 12/26/2016.  He successfully cardioverted at 120 joules.  When he returned for a follow-up evaluation on 01/12/2017 and saw  [name], [facility] .  He was back in atrial flutter.  At that time his propafenone was discontinued.  Over the past several months, Martin Cordova has felt well.  At times there is some trace swelling in his ankles.  He continues to use his BiPAP with 100% compliance.  He was hospitalized on September 19 with an episode of diverticulitis and was discharged on 02/24/2017.   When I saw him in October 2018, he was back in atrial flutter.  At that time, I recommended consideration for atrial flutter ablation.  He was evaluated by Dr. Caryl Comes and on 04/19/2017 underwent successful atrial flutter ablation across the caval tricuspid isthmus, which interrupted bidirectional conduction.  Initial plan was for him to continue anticoagulation for 4 weeks, but unfortunately he developed irregular tachycardia palpitations about 48 hours after the procedure.  He's been found to have paroxysmal atrial fibrillation.  He as result is on chronic anticoagulation  therapy.  In November 2018 an echo Doppler study showed an EF of 40-45%.  He has been on carvedilol 25 mg twice a day and takes an extra carvedilol as needed.  He has had recurrent short burst of atrial fibrillation, but had an episode for almost an entire day.  He received a new BiPAP machine after his previous machine completely malfunction.  He admits to 100% compliance.  [facility] [employer] is his DME company.  He has diabetes mellitus and was started back on Tradjenta for hemoglobin A1c of 7.    He has a [employer] BiPAP auto SV unit. I obtained a download from September 02, 2017 through October 01, 2017.  He is compliant.  His average CPAP  pressure was 16.8 and average pressure support pressure was 4.1.  90% of the time device EPAP was 17 cm.  AHI, however, was still mildly increased at 8.9.  He is unaware of breakthrough snoring.  He denies restless legs.  He denies chest pain.  He has noticed some mild episodes of dizziness in the morning.    I recommended some changes to his BiPAP therapy and change his ramp time to 5 minutes, increased but pressure support to 5.5 increased his EPAP to 9 cm water pressure.  A download was obtained from June 12 through December 14, 2017.  He is 100% compliant and averaging his hours and 24 minutes of CPAP use per night.  His average AHI is still slightly elevated 8.2.  90% of time device CPAP was 17 cm water pressure.  Though his pressure support range can from 0 to 5.5 cm, he apparently has only been needing a pressure support of 2.4 cm of water.  He feels well.  He has a F&P medium cushion full facemask but he is an oral breather and when he opens his mouth the facemask is actually too small which may be contributing to some of his increased AHI.  He presents to sleep clinic for further evaluation. He had been evaluated by Dr. Jimmy Footman for his renal issues.  He denies any awareness of recurrent atrial flutter.  He has seen Dr. Dennard Schaumann here he also has been  diagnosed with monoclonal gammopathy of undetermined significance and is monitored by Dr. Irene Limbo.  He had undergone a cardiac MRI which was ordered by Dr. Caryl Comes last year in February 2019 which showed normal LV size and function with an EF of 54%.  There was no delayed gadolinium uptake on inversion recovery sequences.  He had a trileaflet aortic valve with mild AR, mild MR, moderate RV enlargement, moderate biatrial enlargement and there was moderate LVH with the mid and basal septum measuring 14 mm compared to the posterior wall at 9 mm.  I last saw him in February 2020 at which time he denied any recurrent chest pain or abnormal heart rhythm.  He was using his BiPAP auto SV with 100% compliance.  A download was obtained in the office from January 26 through July 30, 2018.  He is 100% compliant.  He requires high pressures and his average EPAP pressure was 17 which also was his 90% pressure.  He is set up to a potential maximum IPAP pressure of 30.  His pressure support ranges from 0-5.5 with an average pressure support of 2.4 cm.  He is now using a large mask which is much more comfortable and is he is doing better.  On his most recent download, his AHI was still mildly increased at 7.1/h.  He has undergone follow-up hematologic evaluation with Dr. Velvet Bathe for his monoclonal gammopathy.    I saw him on April 05, 2019.  Since his prior evaluation he felt that he may have had 3 brief short-lived episodes of PAF which resolved spontaneously.  He denied any associated chest pain. He underwent Mohs surgery for a skin cancer on his chin.  He was unable to use BiPAP for several days since this is where his mask would rest.  I obtained a download from July 1 through April 04, 2019.  His minimum EPAP pressure was 17 with a maximum IPAP pressure of 30 and pressure support of 5.5 cm.  Apparently his ramp start pressure is 9.5 cm.  Average AHI is 6.9.  90% of the time his device EPAP pressure was 17.  Average breath  rate was 16.1 breaths/min.   Echo Doppler study on January 25, 2019 showed an EF of 60 to 65% with mild LVH.  Normal diastolic parameters.  There was moderate aortic sclerosis without stenosis and mild AR.  I saw him on Oct 08, 2019 at which time he felt well.  He is followed by nephrology with stage IV chronic kidney disease with recent creatinine in February 2021 at 2.59.  He was unaware of any recurrent atrial fibrillation or flutter.  He continues to use BiPAP auto SV therapy with 100% compliance.  He is followed by Dr. Buddy Duty for his diabetes mellitus.   I saw him in November 2021 at which time he remained stable.  He is now followed by Dr. Baird Cancer with the imminent retirement of Dr. Baird Cancer.  Laboratory from April 03, 2020 did show improvement in his creatinine which typically range between 2 and 2.5 and was 1.99.  He denies any significant shortness of breath.  At times he has noticed.  Short brief episodes of possible PAF which self resolved.  His medical regimen has included carvedilol 25 mg twice a day, diltiazem 240 mg daily, furosemide 40 mg for blood pressure and heart rate control.  He is diabetic on Tradjenta.  He has been on Eliquis without bleeding for anticoagulation and continues to be on low-dose atorvastatin and lovaza for hyperlipidemia.  He has a [employer] BiPAP auto SV machine and I discussed with him the recall with a very small risk of Styrofoam breakdown contributed by possible ozone related cleaning cleansing agents.  He had contacted [employer] and is on a list for a new machine.    I saw him for follow-up evaluation on December 04, 2020 and last saw him 3 weeks later on December 21, 2020.  Since his July 1 evaluation he had noticed several months of increased fatigability as well as some mild leg swelling right greater than left.  He denied any chest pain.  He has continued to use his BiPAP ASV.  I obtained a download in the office from June 1 through December 03, 2020 which shows 100% use.   AHI was 5.8 and is 90% EPAP was 17 and 90% pressure support was 3.3 cm.  He denies any chest pain.  He is unaware of rapid palpitations.  During that evaluation, his ECG confirmed that he was in atrial fibrillation with ventricular rate at 79 with right bundle branch block and PVCs.  I suspected that his atrial fibrillation may have been of several months duration and he had been on diltiazem CD2 140 mg daily, carvedilol 25 mg twice a day, and furosemide 40 mg daily both for rate control, edema and blood pressure control.  With his recurrent atrial fibrillation I recommended institution of amiodarone and started him on 200 mg twice a day.  I discussed potential adverse consequences of amiodarone and the importance of monitoring LFTs and thyroid function.  I scheduled him for laboratory and recommended a follow-up echo Doppler study.  He states he is prescription filled and started his therapy on December 08, 2020.  After several days he seemed to notice slightly more energy.  He is unaware of palpitations.   When I saw him on December 21, 2020, his ECG confirmed that he had converted to sinus rhythm.  At that time, I recommended he continue to take amiodarone 200 mg twice a day with plans to eventually reduce  his dosing.  He underwent a follow-up echo Doppler study on December 23, 2020 which showed normal LV function with EF at 60 to 65%, mild concentric LVH, grade 2 diastolic dysfunction, and there was evidence for moderate left atrial dilatation and mild right atrial dilatation.  There was mild aortic sclerosis without stenosis.  He now sees Dr. Joelyn Oms for his CKD.  At his March 17, 2021 follow-up evaluation he remained stable and denied any chest pain or shortness of breath, dizziness, presyncope or syncope. He is seeing Dr. Dennard Schaumann.  He is no longer on Tradjenta but is now on Farxiga for his diabetes mellitus.  He continues to be on Eliquis 5 mg twice a day, carvedilol 25 mg twice a day, diltiazem 240 mg daily as  well as amiodarone 200 mg twice a day.  He has been taking furosemide 40 alternating with 20 mg every other day.  He is on atorvastatin and Lovaza for mixed hyperlipidemia.  His ECG revealed sinus bradycardia and I recommended reduction of his amiodarone dose from 40 mg daily down to 300 mg for 3 weeks and further reduction to 200 mg daily.  He continued to be on carvedilol 25 mg twice a day and long-acting diltiazem 240 mg daily.  He sees Dr. Joelyn Oms for his CKD and in July 2022 creatinine had increased to 2.24.  He continues to use BiPAP with 100% compliance.  I last saw him on May 19, 2021 at which time he felt well.  Ten days previously  he tested positive for COVID and had mild URI symptoms without fever but did lose his sense of taste for several days.  He presently feels well.  He is unaware of any definitive breakthrough atrial fibrillation but he had noticed transient mild irregularity to heart rhythm last evening.  He continues to be on Eliquis 5 mg twice a day, amiodarone 20 mg daily, carvedilol 25 mg twice a day, diltiazem 240 mg, and has been taking furosemide 40 alternating with 20 mg every other day.  He had undergone recent laboratory on November 11 which showed creatinine at 2.2.  LDL cholesterol in July was 48.  During that evaluation, blood pressure was stable.  He was maintaining sinus rhythm.  Since I last saw him, he has continued to do well.  He currently is on amiodarone 200 mg alternating with 300 mg every other day, carvedilol 12.5 mg twice a day, diltiazem 240 mg daily, Eliquis 5 mg twice a day, furosemide 20 mg alternating with 40 mg on alternating days, Jardiance 10 mg, and Lovaza 2 capsules twice a day in addition to atorvastatin 10 mg at bedtime.  He is unaware of recurrent atrial fibrillation.  He continues to see Dr. Buddy Duty for endocrine.  He admits to some fatigability.  He is being followed by Dr. Joelyn Oms of nephrology with creatinine July 2023 at 2.68.  He continues to be  on BiPAP with 100% use.  He presents for reevaluation.  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 Claiborne Billings   Diabetes mellitus without complication    Renal insufficiency     Cr to 3 on ACE   Nephrolithiasis  Normal coronary arteries 04/06/2007    by cath EF 55%., last ECHO 10/13/10 EF>55% mild MR, last nuc 06/16/10 EF 57% low risk scan   Pulmonary HTN     RV syst.  40-28mHg- moderate by echo   Palpitations     tachy   PAF (paroxysmal atrial fibrillation) 04/09/2013    Past Surgical History  Procedure Laterality Date   Cholecystectomy     Rotator cuff repair     Cystoscopy/retrograde/ureteroscopy  08/19/2011    Procedure: CYSTOSCOPY/RETROGRADE/URETEROSCOPY;  Surgeon: MHanley Ben MD;  Location: [facility]  Service: Urology;  Laterality: Right;  JJ STENT PLACEMENT    Cardiac catheterization  04/06/2007    normal cors    Allergies  Allergen Reactions   Lisinopril     Renal insufficiency    Current Outpatient Medications:    amiodarone (PACERONE) 200 MG tablet, TAKE 1 & 1/2 TABLETS (300 MG) DAILY FOR 3 WEEKS, THEN DECREASE TO 1 TABLET (200 MG) DAILY., Disp: 180 tablet, Rfl: 2   atorvastatin (LIPITOR) 10 MG tablet, TAKE 1 TABLET BY MOUTH EVERYDAY AT BEDTIME, Disp: 90 tablet, Rfl: 3   diltiazem (CARDIZEM CD) 240 MG 24 hr capsule, TAKE 1 CAPSULE BY MOUTH EVERYDAY AT BEDTIME, Disp: 90 capsule, Rfl: 3   ELIQUIS 5 MG TABS tablet, TAKE 1 TABLET BY MOUTH TWICE A DAY, Disp: 180 tablet, Rfl: 1   empagliflozin (JARDIANCE) 10 MG TABS tablet, Take 10 mg by mouth daily., Disp: , Rfl:    fluticasone (FLONASE) 50 MCG/ACT nasal spray, Place 2 sprays into  both nostrils daily., Disp: 16 g, Rfl: 6   furosemide (LASIX) 40 MG tablet, Alternate between taking 463m(1 tablet) a day and 2033m1/2 tablet) a day., Disp: 90 tablet, Rfl: 3   hydrochlorothiazide (MICROZIDE) 12.5 MG capsule, Take 1 capsule (12.5 mg total) by mouth daily., Disp: 30 capsule, Rfl: 4   levocetirizine (XYZAL) 5 MG tablet, TAKE 1 TABLET BY MOUTH EVERY DAY IN THE EVENING, Disp: 90 tablet, Rfl: 3   omega-3 acid ethyl esters (LOVAZA) 1 g capsule, TAKE 2 CAPSULES BY MOUTH EVERY DAY, Disp: 180 capsule, Rfl: 3   carvedilol (COREG) 12.5 MG tablet, Take 1 tablet (12.5 mg total) by mouth 2 (two) times daily with a meal. TAKE 12.50 MG IN THE AM AND 6.25MG IN THE PM, Disp: 45 tablet, Rfl: 6  Socially he is married has 4 children and 5 grandchildren. He is a distant relative to the "[place]vel[place] brothers." He does try to walk and exercise. There is no tobacco use. He does drink occasional alcohol.   ROS General: Negative; No fevers, chills, or night sweats;  HEENT: Negative; No changes in vision or hearing, sinus congestion, difficulty swallowing Pulmonary: Negative; No cough, wheezing, shortness of breath, hemoptysis Cardiovascular: See history of present illness No recent peripheral edema GI: Status post recent episode of diverticulitis requiring 2 day hospitalization GU:  No dysuria, hematuria, or difficulty voiding; some difficulty with erectile function Musculoskeletal: Negative; no myalgias, joint pain, or weakness Hematologic/Oncology: Positive M spike with monoclonal gammopathy of undetermined significance; history of thrombocytopenia Endocrine: Negative; no heat/cold intolerance; no diabetes Neuro: Negative; no changes in balance, headaches Skin: Negative; No rashes or skin lesions Psychiatric: Negative; No behavioral problems, depression Sleep: He is using his BiPAP Auto SV therapy with 100% compliance.  No snoring, daytime sleepiness, hypersomnolence, bruxism, restless legs,  hypnogognic hallucinations, no cataplexy Other comprehensive 14 point system review is negative.  PE BP 130/72   Pulse (!) 48  Ht _0  (1.803 m)   Wt 227 lb 12.8 oz (103.3 kg)   SpO2 97%   BMI 31.77 kg/m    Repeat blood pressure by me was 154/76.  He states his blood pressure at home typically has been running in the 096-283 range systolically.  Wt Readings from Last 3 Encounters:  04/11/22 227 lb 12.8 oz (103.3 kg)  12/23/21 228 lb 6.4 oz (103.6 kg)  10/22/21 238 lb (108 kg)   General: Alert, oriented, no distress.  Skin: normal turgor, no rashes, warm and dry HEENT: Normocephalic, atraumatic. Pupils equal round and reactive to light; sclera anicteric; extraocular muscles intact;  Nose without nasal septal hypertrophy Mouth/Parynx benign; Mallinpatti scale 3/4 Neck: No JVD, no carotid bruits; normal carotid upstroke Lungs: clear to ausculatation and percussion; no wheezing or rales Chest wall: without tenderness to palpitation Heart: PMI not displaced, RRR, s1 s2 normal, 1/6 systolic murmur, no diastolic murmur, no rubs, gallops, thrills, or heaves Abdomen: soft, nontender; no hepatosplenomehaly, BS+; abdominal aorta nontender and not dilated by palpation. Back: no CVA tenderness Pulses 2+ Musculoskeletal: full range of motion, normal strength, no joint deformities Extremities: no clubbing cyanosis or edema, Homan's sign negative  Neurologic: grossly nonfocal; Cranial nerves grossly wnl Psychologic: Normal mood and affect    April 11, 2022 ECG (independently read by me): Sinus bradycardia at 48, RBBB, 1st degree block, PR 246 msec, T wave abnormality  May 19, 2021 ECG (independently read by me): Sinus bradycardiia at 53, RBBB, LAHB  March 17, 2021 ECG (independently read by me): SInus bradycardia at 45, 1st degree AV block, PR 216 msec; RBBB  December 21, 2020 ECG (independently read by me): Sinus bradycardia at 55, 1st degree AV block; PR 228 msec; RBBBB   December 04, 2020 ECG (independently read by me): Atrial fibrillation at 79, RBBB, PVC  May 04, 2020 ECG (independently read by me): Sinus bradycardia at 56, PVC, RBBB  May 4, 2021ECG (independently read by me): Normal sinus rhythm at 63 bpm with premature complex.  Right bundle branch block with repolarization changes.  Normal intervals.  April 05, 2019 ECG (independently read by me): Sinus Bradycardia ay 55: RBBB with repolarization.  February 25,2020 ECG (independently read by me): Sinus bradycardia at 56 bpm.  Right bundle branch block with repolarization changes.  October 03, 2017 ECG (independently read by me):  Sinus bradycardia at 50 bpm.  Right bundle branch block with repolarization changes. QTc interval 461 ms.  January 2019 ECG (independently read by me): Sinus bradycardia 56 bpm.  Possible left atrial enlargement.  Right bundle branch block with repolarization changes.  PR interval 186 ms; QTc interval 436 ms  October 2018 ECG (independently read by me): Atrial flutter with variable block at 96 bpm.  Right bundle-branch block with repolarization changes.  QTc interval 437 ms  July 2018 ECG (independently read by me): Probable atrial flutter with a ventricular rate at 82 bpm.  Right bundle-branch block, left anterior hemiblock.  11/08/2016 ECG (independently read by me): Probable atrial flutter 3:1 block , ventricular rate at 85;  right bundle branch block with repolarization changes.  QTc interval 528 ms.   May 2018 ECG (independently read by me): atrial flutter with variable block with ventricular rate at 89 bpm.  Right bundle-branch block, left anterior hemiblock.  September 2017 ECG (independently read by me): Normal sinus rhythm at 63 bpm.  First-degree AV block with a PR interval of 208 ms.  Right bundle branch block with repolarization  changes.  QTc interval 466 ms.  10/28/15 ECG (independently read by me): Sinus bradycardia at 54 bpm.  First degree block with PR interval 222  ms.  Right bundle-branch block.  10/21/2015 ECG (independently read by me): Atrial flutter with variable block.  Right bundle-branch block with repolarization changes.  October 2016 ECG (independently read by me): sinus rhythm with first-degree AV block with a PR interval at 216 ms.  Ventricular rate 86 bpm with occasional PVCs.    May 2016 ECG (independently read by me): Normal sinus rhythm at 60 bpm.  Right bundle-branch block with repolarization changes.  November 2015 ECG (independently read by me): Normal sinus rhythm at 61.  Nonspecific T abnormality.  QTc interval 394 ms.  11/18/2013 ECG (independently read by me): Sinus bradycardia 58 beats per minute.  PR interval 198 ms; QTc interval 371 ms.  Nonspecific ST changes.  07/29/2013 ECG  (independently read by me): Atrial flutter with 2:1 block with a ventricular rate of 87 beats per minute. Atrial rate is approximately 360 ms. 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 fib flutter now with right bundle branch block and repolarization changes.  Prior ECG of 06/04/2013: EKG  suggests probable atrial flutter with a ventricular rate of 105 beats per minute. There also are frequent PVCs. QTc interval is 436 ms. They're nonspecific T changes.  LABS:    Latest Ref Rng & Units 10/22/2021    9:53 AM 04/16/2021    9:17 AM 12/04/2020   10:21 AM  BMP  Glucose 70 - 99 mg/dL 101  126  119   BUN 8 - 27 mg/dL 26  28  39   Creatinine 0.76 - 1.27 mg/dL 2.69  2.20  2.24   BUN/Creat Ratio 10 - _0 Sodium 134 - 144 mmol/L 142  143  142   Potassium 3.5 - 5.2 mmol/L 5.1  4.9  4.8   Chloride 96 - 106 mmol/L 107  106  101   CO2 20 - 29 mmol/L _1 Calcium 8.6 - 10.2 mg/dL 10.0  9.8  10.5       Latest Ref Rng & Units 10/22/2021    9:53 AM 04/16/2021    9:17 AM 12/04/2020   10:21 AM  Hepatic Function  Total Protein 6.0 - 8.5 g/dL 6.6  7.0  7.1   Albumin 3.7 - 4.7  g/dL 4.3  4.3  4.6   AST 0 - 40 IU/L _2 ALT 0 - 44 IU/L 37  48  43   Alk Phosphatase 44 - 121 IU/L 60  61  51   Total Bilirubin 0.0 - 1.2 mg/dL 0.6  0.5  0.8       Latest Ref Rng & Units 10/22/2021    9:53 AM 04/16/2021    9:17 AM 12/04/2020   10:21 AM  CBC  WBC 3.4 - 10.8 x10E3/uL 5.1  5.5  5.6   Hemoglobin 13.0 - 17.7 g/dL 14.9  14.1  15.5   Hematocrit 37.5 - 51.0 % 44.6  43.0  46.6   Platelets 150 - 450 x10E3/uL 99  125  128    Lab Results  Component Value Date   TSH 3.100 10/22/2021   Lipid Panel     Component Value Date/Time   CHOL 108 12/22/2020 0847   TRIG 153 (H) 12/22/2020 0847   HDL  36 (L) 12/22/2020 0847   CHOLHDL 3.0 12/22/2020 0847   VLDL 37 (H) 10/25/2016 0853   LDLCALC 48 12/22/2020 0847   LDLDIRECT 65.0 04/12/2013 0825     RADIOLOGY: No results found.  IMPRESSION:  1. Essential hypertension   2. PAF (paroxysmal atrial fibrillation) ([place])   3. CKD (chronic kidney disease) stage 4, GFR 15-29 ml/min (HCC)   4. Type 2 diabetes mellitus with stage 4 chronic kidney disease, without long-term current use of insulin ([place])   5. Hyperlipidemia LDL goal <70   6. OSA treated with BiPAP   7. Right bundle branch block   8. Anticoagulation adequate     ASSESSMENT AND PLAN: Martin Cordova is a 81 year-old Caucasian male who has documented normal coronary arteries by cardiac catheterization in 2008. He has a history of hypertension, mixed hyperlipidemia, and has paroxysmal atrial fibrillation/flutter. He has a history of diabetes mellitus and after he had lost a significant amount of weight and with renal insufficiency he was taken off his diabetic medication.  He had been started back on Tradjenta for his diabetes mellitus but since his most recent evaluation this has been discontinued and he was on Farxiga, and now most recently on Jardiance 10 mg daily. He underwent successful catheter ablation for recurrent atrial flutter in November 2018 and remains on  long-term anticoagulation with Eliquis.  He denies recurrent arrhythmia.  Dr Caryl Comes had ordered an MRI of his heart in February 2019 which did not reveal any delayed gadolinium uptake arguing against scar, infiltration or hypertrophic cardiomyopathy.  There was no SAM.  Since his evaluation with me in November 2021, he had noticed more fatigability.  At his office visit on December 04, 2020 ECG confirmed that he was in atrial fibrillation which I suspect was of several months duration.  At the time he already was on carvedilol 25 mg twice a day, Cardizem CD 240 mg daily, and furosemide 40 mg daily.  I initiated amiodarone 200 mg twice a day which he started on December 08, 2020.   When I saw him several weeks later on December 21, 2020 he felt improved and ECG confirmed that he had not converted to sinus rhythm.  At his last evaluation in October 2022 he was maintaining sinus rhythm but was bradycardic with heart rate at 45.  At that time, his dose of amiodarone was reduced.  Currently presently he tells me he has been taking amiodarone 200 mg alternating with 300 mg every other day.  His resting pulse is 48.  He continues to be on carvedilol 12.5 mg twice a day and diltiazem 240 mg daily.  He is unaware of any breakthrough atrial fibrillation or atrial flutter.  He alternates furosemide 40 mg with 20 mg every other day.  He has been followed closely by Dr. Letta Moynahan for endocrinology.  He is being followed by Dr. Joelyn Oms of nephrology with most recent creatinine increasing from 2.2-2.68 in July 2023.  He has polycystic kidney disease.  He continues to use BiPAP with 100% compliance and does not sleep without it.  With his bradycardia I am reducing carvedilol to 12.5 mg in the morning and 6.25 mg at night.  His blood pressure today is elevated and I will initiate very low-dose hydralazine at 12.5 mg twice a day with target blood pressure less than 130/80.  He has follow-up appointments to see Dr. Joelyn Oms as well as Dr. Caryl Comes.  I will see  him in 3 to 4 months for follow-up  evaluation.    Shelva Majestic, MD  04/17/2022  10:26 AM

## 2022-04-13 ENCOUNTER — Encounter: Payer: Self-pay | Admitting: Podiatry

## 2022-04-13 ENCOUNTER — Ambulatory Visit (INDEPENDENT_AMBULATORY_CARE_PROVIDER_SITE_OTHER): Payer: Medicare PPO | Admitting: Podiatry

## 2022-04-13 DIAGNOSIS — M79674 Pain in right toe(s): Secondary | ICD-10-CM

## 2022-04-13 DIAGNOSIS — M79675 Pain in left toe(s): Secondary | ICD-10-CM | POA: Diagnosis not present

## 2022-04-13 DIAGNOSIS — B351 Tinea unguium: Secondary | ICD-10-CM | POA: Diagnosis not present

## 2022-04-13 DIAGNOSIS — Z7901 Long term (current) use of anticoagulants: Secondary | ICD-10-CM | POA: Diagnosis not present

## 2022-04-13 DIAGNOSIS — E119 Type 2 diabetes mellitus without complications: Secondary | ICD-10-CM

## 2022-04-13 DIAGNOSIS — N289 Disorder of kidney and ureter, unspecified: Secondary | ICD-10-CM | POA: Diagnosis not present

## 2022-04-13 DIAGNOSIS — D696 Thrombocytopenia, unspecified: Secondary | ICD-10-CM

## 2022-04-13 NOTE — Progress Notes (Signed)
This patient returns to my office for at risk foot care.  This patient requires this care by a professional since this patient will be at risk due to having diabetes and renal insufficiency and thrombocytopenia..  Patient is taking eliquis.  This patient is unable to cut nails himself since the patient cannot reach his nails.These nails are painful walking and wearing shoes.  This patient presents for at risk foot care today.  General Appearance  Alert, conversant and in no acute stress.  Vascular  Dorsalis pedis and posterior tibial  pulses are palpable  bilaterally.  Capillary return is within normal limits  bilaterally. Temperature is within normal limits  bilaterally.  Neurologic  Senn-Weinstein monofilament wire test within normal limits  bilaterally. Muscle power within normal limits bilaterally.  Nails Thick disfigured discolored nails with subungual debris  from hallux to fifth toes bilaterally. No evidence of bacterial infection or drainage bilaterally.  Orthopedic  No limitations of motion  feet .  No crepitus or effusions noted.  No bony pathology or digital deformities noted.  HAV  B/L.  Skin  normotropic skin with no porokeratosis noted bilaterally.  No signs of infections or ulcers noted.     Onychomycosis  Pain in right toes  Pain in left toes  Consent was obtained for treatment procedures.   Mechanical debridement of nails 1-5  bilaterally performed with a nail nipper.  Filed with dremel without incident.    Return office visit   3 months                   Told patient to return for periodic foot care and evaluation due to potential at risk complications.   Willson Lipa DPM  

## 2022-04-15 ENCOUNTER — Encounter: Payer: Self-pay | Admitting: Cardiovascular Disease

## 2022-04-17 ENCOUNTER — Encounter: Payer: Self-pay | Admitting: Cardiovascular Disease

## 2022-04-19 MED ORDER — HYDRALAZINE HCL 25 MG PO TABS: 12.5000 mg | ORAL_TABLET | Freq: Three times a day (TID) | ORAL | 12 refills | Status: DC

## 2022-04-19 NOTE — Telephone Encounter (Signed)
Called [facility]  spoke with Hayley she will take HCTZ off his list and they are filling the hydralazine.

## 2022-04-19 NOTE — Telephone Encounter (Signed)
HCTZ was sent in error will call pharmacy and send correct rx. Called pt he states that he will throw away the HCTZ and he has not taken any of these. New rx sent. Will call  pharmacy.

## 2022-04-20 ENCOUNTER — Telehealth: Payer: Self-pay | Admitting: Cardiovascular Disease

## 2022-04-20 MED ORDER — HYDRALAZINE HCL 25 MG PO TABS: 12.5000 mg | ORAL_TABLET | Freq: Two times a day (BID) | ORAL | 3 refills | Status: DC

## 2022-04-20 NOTE — Telephone Encounter (Signed)
  Pt c/o medication issue:  1. Name of Medication: hydrALAZINE (APRESOLINE) 25 MG tablet   2. How are you currently taking this medication (dosage and times per day)?   Take 0.5 tablets (12.5 mg total) by mouth 3 (three) times daily.    3. Are you having a reaction (difficulty breathing--STAT)? No   4. What is your medication issue? Pt said, he was told to take this medication 2 times a day but when he picked it up the medication yesterday it say to take 3 times a day. He would like to get correct dosage

## 2022-04-20 NOTE — Telephone Encounter (Signed)
Per Dr. Evette Georges note 04/11/22 : His blood pressure today is elevated and I will initiate very low-dose hydralazine at 12.5 mg twice a day with target blood pressure less than 130/80.   Correct dose escribed to pharmacy.   Pt notified and thankful for the call.

## 2022-04-22 ENCOUNTER — Ambulatory Visit: Payer: Medicare PPO | Admitting: Podiatry

## 2022-05-06 DIAGNOSIS — Q613 Polycystic kidney, unspecified: Secondary | ICD-10-CM | POA: Diagnosis not present

## 2022-05-06 DIAGNOSIS — N1832 Chronic kidney disease, stage 3b: Secondary | ICD-10-CM | POA: Diagnosis not present

## 2022-05-06 DIAGNOSIS — E669 Obesity, unspecified: Secondary | ICD-10-CM | POA: Diagnosis not present

## 2022-05-06 DIAGNOSIS — E119 Type 2 diabetes mellitus without complications: Secondary | ICD-10-CM | POA: Diagnosis not present

## 2022-05-13 ENCOUNTER — Ambulatory Visit: Payer: Medicare PPO | Admitting: Family Medicine

## 2022-05-13 VITALS — BP 128/82 | HR 59 | Ht 71.0 in | Wt 228.0 lb

## 2022-05-13 DIAGNOSIS — M542 Cervicalgia: Secondary | ICD-10-CM | POA: Diagnosis not present

## 2022-05-13 MED ORDER — TIZANIDINE HCL 4 MG PO TABS: 4.0000 mg | ORAL_TABLET | Freq: Four times a day (QID) | ORAL | 0 refills | Status: DC | PRN

## 2022-05-13 NOTE — Progress Notes (Signed)
Subjective:    Patient ID: Martin Cordova, male    DOB: Oct 12, 1940, 81 y.o.   MRN: [id number]  HPI  Patient reports right-sided neck pain for approximately 1 month.  He did fall in the yard and laying awkwardly on his right side.  The neck pain began around the same time.  He believes that his BiPAP straps may also be aggravating his neck.  He sleeps wearing a BiPAP every night and the strap is putting pressure in that area.  The pain is located on the lateral side of his neck roughly around the level of C4.  He states that the pain is worse first thing in the morning.  Although it is present even throughout the day.  There is no crepitus with range of motion.  Turning his head does not seem to exacerbate the pain.  He denies any fevers or chills or night sweats.  He denies any numbness or tingling or weakness radiating down his right arm.  On examination today there is no palpable mass on the right side of his neck.  There is no lymphadenopathy.  There is no redness or rash.  There is no palpable abnormality   Past Medical History:  Diagnosis Date   Allergy    Arthritis    Cataract    Clotting disorder ([place])    Colon polyps    Diabetes mellitus without complication ([place])    Diverticulitis    Diverticulosis    GERD 12/07/2006   HYPERGLYCEMIA 12/07/2006   HYPERLIPIDEMIA 12/07/2006   mixed wth an atherogenic dyslipidemic pattern   HYPERTENSION 12/07/2006   Nephrolithiasis    Nonalcoholic fatty liver disease    Normal coronary arteries 04/06/2007   by cath EF 55%., last ECHO 10/13/10 EF>55% mild MR, last nuc 06/16/10 EF 57% low risk scan   OBESITY 11/10/2009   OSA treated with BiPAP 10/22/2007   PAF (paroxysmal atrial fibrillation) ([place]) 04/09/2013   Palpitations    tachy   Pulmonary HTN (HCC)    RV syst.  40-75mHg- moderate by echo   Renal insufficiency    Cr to 3 on ACE   Sleep apnea    SOB (shortness of breath)    with exertion   THROMBOCYTOPENIA 12/07/2006   TRANSAMINASES,  SERUM, ELEVATED 12/07/2006   VENTRICULAR TACHYCARDIA 03/27/2007   monitored by dr. kClaiborne Billings  Past Surgical History:  Procedure Laterality Date   A-FLUTTER ABLATION N/A 04/19/2017   Procedure: A-FLUTTER ABLATION;  Surgeon: KDeboraha Sprang MD;  [facility];  Service: Cardiovascular;  Laterality: N/A;   CARDIAC CATHETERIZATION  04/06/2007   normal cors   CARDIOVERSION N/A 08/05/2013   Procedure: CARDIOVERSION;  Surgeon: TTroy Sine MD;  [facility]  Service: Cardiovascular;  Laterality: N/A;   CARDIOVERSION N/A 12/26/2016   Procedure: CARDIOVERSION;  Surgeon: KTroy Sine MD;  [facility]  Service: Cardiovascular;  Laterality: N/A;   CHOLECYSTECTOMY     CYSTOSCOPY/RETROGRADE/URETEROSCOPY  08/19/2011   Procedure: CYSTOSCOPY/RETROGRADE/URETEROSCOPY;  Surgeon: MHanley Ben MD;  [facility]  Service: Urology;  Laterality: Right;  JJ STENT PLACEMENT   EYE SURGERY     ROTATOR CUFF REPAIR Right    Current Outpatient Medications on File Prior to Visit  Medication Sig Dispense Refill   hydrALAZINE (APRESOLINE) 25 MG tablet Take 0.5 tablets (12.5 mg total) by mouth 2 (two) times daily. 90 tablet 3   amiodarone (PACERONE) 200 MG tablet TAKE 1 & 1/2 TABLETS (300  MG) DAILY FOR 3 WEEKS, THEN DECREASE TO 1 TABLET (200 MG) DAILY. 180 tablet 2   atorvastatin (LIPITOR) 10 MG tablet TAKE 1 TABLET BY MOUTH EVERYDAY AT BEDTIME 90 tablet 3   carvedilol (COREG) 12.5 MG tablet Take 1 tablet (12.5 mg total) by mouth 2 (two) times daily with a meal. TAKE 12.50 MG IN THE AM AND 6.'25MG'$  IN THE PM 45 tablet 6   diltiazem (CARDIZEM CD) 240 MG 24 hr capsule TAKE 1 CAPSULE BY MOUTH EVERYDAY AT BEDTIME 90 capsule 3   ELIQUIS 5 MG TABS tablet TAKE 1 TABLET BY MOUTH TWICE A DAY 180 tablet 1   empagliflozin (JARDIANCE) 10 MG TABS tablet Take 10 mg by mouth daily.     fluticasone (FLONASE) 50 MCG/ACT nasal spray Place 2 sprays into both nostrils  daily. 16 g 6   furosemide (LASIX) 40 MG tablet Alternate between taking '40mg'$  (1 tablet) a day and '20mg'$  (1/2 tablet) a day. 90 tablet 3   levocetirizine (XYZAL) 5 MG tablet TAKE 1 TABLET BY MOUTH EVERY DAY IN THE EVENING 90 tablet 3   omega-3 acid ethyl esters (LOVAZA) 1 g capsule TAKE 2 CAPSULES BY MOUTH EVERY DAY 180 capsule 3   No current facility-administered medications on file prior to visit.   Allergies  Allergen Reactions   Lisinopril Swelling and Other (See Comments)    Renal insufficiency also   Dulaglutide Other (See Comments)   Ibuprofen Other (See Comments)    Per the patient, he has "kidney and liver issues" and is NOT suppose to take this   Social History   Socioeconomic History   Marital status: Married    Spouse name: Not on file   Number of children: 4   Years of education: Not on file   Highest education level: Not on file  Occupational History   Occupation: retired Pharmacist, hospital  Tobacco Use   Smoking status: Former    Packs/day: 0.25    Years: 10.00    Total pack years: 2.50    Types: Cigarettes    Quit date: 08/04/1980    Years since quitting: 41.8   Smokeless tobacco: Former    Types: Chew    Quit date: 08/08/2004  Vaping Use   Vaping Use: Never used  Substance and Sexual Activity   Alcohol use: Not Currently   Drug use: No   Sexual activity: Yes  Other Topics Concern   Not on file  Social History Narrative   Not on file   Social Determinants of Health   Financial Resource Strain: Not on file  Food Insecurity: Not on file  Transportation Needs: Not on file  Physical Activity: Not on file  Stress: Not on file  Social Connections: Not on file  Intimate Partner Violence: Not on file   Family History  Problem Relation Age of Onset   Diabetes Mother    Heart failure Mother    Stroke Mother    Kidney failure Father    Diabetes Father    Cancer Paternal Uncle    Colon cancer Neg Hx    Esophageal cancer Neg Hx    Stomach cancer Neg Hx    Rectal  cancer Neg Hx      Review of Systems     Objective:   Physical Exam Vitals reviewed.  Constitutional:      General: He is not in acute distress.    Appearance: He is not diaphoretic.  HENT:     Head: Normocephalic and atraumatic.  Right Ear: External ear normal.     Left Ear: External ear normal.     Nose: Nose normal.     Mouth/Throat:     Pharynx: No oropharyngeal exudate.  Eyes:     General: No scleral icterus.       Right eye: No discharge.        Left eye: No discharge.     Conjunctiva/sclera: Conjunctivae normal.     Pupils: Pupils are equal, round, and reactive to light.  Neck:   Cardiovascular:     Rate and Rhythm: Normal rate. Rhythm irregular.     Heart sounds: Normal heart sounds. No murmur heard.    No friction rub. No gallop.  Pulmonary:     Effort: Pulmonary effort is normal. No respiratory distress.     Breath sounds: Normal breath sounds. No stridor. No wheezing or rales.  Chest:     Chest wall: No tenderness.  Abdominal:     General: Bowel sounds are normal. There is no distension.     Palpations: Abdomen is soft. There is no mass.     Tenderness: There is no abdominal tenderness. There is no guarding or rebound.  Musculoskeletal:        General: No tenderness. Normal range of motion.     Cervical back: Normal range of motion and neck supple. No rigidity or crepitus. Muscular tenderness present. No pain with movement. Normal range of motion.  Lymphadenopathy:     Cervical: No cervical adenopathy.  Skin:    General: Skin is warm.     Coloration: Skin is not pale.     Findings: No erythema or rash.  Neurological:     Mental Status: He is alert and oriented to person, place, and time.     Cranial Nerves: No cranial nerve deficit.     Coordination: Coordination normal.  Psychiatric:        Behavior: Behavior normal.        Thought Content: Thought content normal.        Judgment: Judgment normal.           Assessment & Plan:  Neck pain  - Plan: DG Cervical Spine Complete Most likely explanation is muscle spasms or strain in the trapezius muscle versus degenerative disc disease and arthritic changes of the neck.  Recommended getting an x-ray to evaluate further.  Due to chronic kidney disease he cannot take nsaids.  Therefore, I recommend zanaflex 4 mg p.o. nightly prior to bedtime to see if we can relax muscle tension in his neck and help pain.  Await also the results of the x-ray

## 2022-05-20 ENCOUNTER — Ambulatory Visit
Admission: RE | Admit: 2022-05-20 | Discharge: 2022-05-20 | Disposition: A | Discharge status: Home / Self Care | Payer: Medicare PPO | Source: Ambulatory Visit | Attending: Family Medicine | Admitting: Family Medicine

## 2022-05-20 DIAGNOSIS — M542 Cervicalgia: Secondary | ICD-10-CM | POA: Diagnosis not present

## 2022-05-23 ENCOUNTER — Encounter: Payer: Self-pay | Admitting: Family Medicine

## 2022-06-07 DIAGNOSIS — N184 Chronic kidney disease, stage 4 (severe): Secondary | ICD-10-CM | POA: Diagnosis not present

## 2022-06-21 ENCOUNTER — Encounter: Payer: Self-pay | Admitting: Cardiovascular Disease

## 2022-07-14 ENCOUNTER — Other Ambulatory Visit: Payer: Self-pay

## 2022-07-14 DIAGNOSIS — Z79899 Other long term (current) drug therapy: Secondary | ICD-10-CM

## 2022-07-15 ENCOUNTER — Ambulatory Visit: Payer: Medicare PPO | Admitting: Podiatry

## 2022-07-25 ENCOUNTER — Other Ambulatory Visit: Payer: Self-pay | Admitting: Cardiovascular Disease

## 2022-07-25 DIAGNOSIS — I480 Paroxysmal atrial fibrillation: Secondary | ICD-10-CM

## 2022-07-25 NOTE — Telephone Encounter (Signed)
Prescription refill request for Eliquis received. Indication: PAF Last office visit: 04/11/22  Corky Downs MD Scr: 2.69 on 10/1921 Age: 82 Weight: 103.3kg  Based on above findings Eliquis 62m twice daily is then appropriate dose.  Refill approved.

## 2022-08-01 ENCOUNTER — Encounter: Payer: Self-pay | Admitting: Cardiovascular Disease

## 2022-08-02 ENCOUNTER — Other Ambulatory Visit: Payer: Self-pay

## 2022-08-02 ENCOUNTER — Telehealth: Payer: Self-pay | Admitting: Cardiovascular Disease

## 2022-08-02 ENCOUNTER — Other Ambulatory Visit: Payer: Self-pay | Admitting: Cardiovascular Disease

## 2022-08-02 DIAGNOSIS — I10 Essential (primary) hypertension: Secondary | ICD-10-CM

## 2022-08-02 MED ORDER — FUROSEMIDE 40 MG PO TABS: ORAL_TABLET | ORAL | 3 refills | Status: DC

## 2022-08-02 NOTE — Telephone Encounter (Signed)
*  STAT* If patient is at the pharmacy, call can be transferred to refill team.   1. Which medications need to be refilled? (please list name of each medication and dose if known)  furosemide (LASIX) 40 MG tablet  2. Which pharmacy/location (including street and city if local pharmacy) is medication to be sent to? [place], [address]  3. Do they need a 30 day or 90 day supply?  90 day supply

## 2022-08-03 ENCOUNTER — Encounter: Payer: Self-pay | Admitting: Podiatry

## 2022-08-03 ENCOUNTER — Ambulatory Visit (INDEPENDENT_AMBULATORY_CARE_PROVIDER_SITE_OTHER): Payer: Medicare PPO | Admitting: Podiatry

## 2022-08-03 DIAGNOSIS — B351 Tinea unguium: Secondary | ICD-10-CM | POA: Diagnosis not present

## 2022-08-03 DIAGNOSIS — M79675 Pain in left toe(s): Secondary | ICD-10-CM | POA: Diagnosis not present

## 2022-08-03 DIAGNOSIS — M79674 Pain in right toe(s): Secondary | ICD-10-CM

## 2022-08-03 DIAGNOSIS — E119 Type 2 diabetes mellitus without complications: Secondary | ICD-10-CM

## 2022-08-03 NOTE — Progress Notes (Signed)
This patient returns to my office for at risk foot care.  This patient requires this care by a professional since this patient will be at risk due to having diabetes and renal insufficiency and thrombocytopenia..  Patient is taking eliquis.  This patient is unable to cut nails himself since the patient cannot reach his nails.These nails are painful walking and wearing shoes.  This patient presents for at risk foot care today.  General Appearance  Alert, conversant and in no acute stress.  Vascular  Dorsalis pedis and posterior tibial  pulses are palpable  bilaterally.  Capillary return is within normal limits  bilaterally. Temperature is within normal limits  bilaterally.  Neurologic  Senn-Weinstein monofilament wire test within normal limits  bilaterally. Muscle power within normal limits bilaterally.  Nails Thick disfigured discolored nails with subungual debris  from hallux to fifth toes bilaterally. No evidence of bacterial infection or drainage bilaterally.  Orthopedic  No limitations of motion  feet .  No crepitus or effusions noted.  No bony pathology or digital deformities noted.  HAV  B/L.  Skin  normotropic skin with no porokeratosis noted bilaterally.  No signs of infections or ulcers noted.     Onychomycosis  Pain in right toes  Pain in left toes  Consent was obtained for treatment procedures.   Mechanical debridement of nails 1-5  bilaterally performed with a nail nipper.  Filed with dremel without incident.    Return office visit   3 months                   Told patient to return for periodic foot care and evaluation due to potential at risk complications.   Gardiner Barefoot DPM

## 2022-08-09 ENCOUNTER — Encounter: Payer: Self-pay | Admitting: Family Medicine

## 2022-08-16 ENCOUNTER — Telehealth: Payer: Self-pay | Admitting: Cardiovascular Disease

## 2022-08-16 NOTE — Telephone Encounter (Signed)
Pt called in stating that several months ago, [facility] stated that they could get him a new machine since the pt's current machine is not able to be adjusted from the office. Pt stated she was going to talk to Dr. Claiborne Billings about it, and was just wondering if there is an update since he hasn't heard anything back yet.

## 2022-08-16 NOTE — Telephone Encounter (Signed)
Patient will bring his BIPAP machine by the office tomorrow so that I can get a manual download in order for Dr Claiborne Billings to know where he is and if the pressure settings will need to be changed so we can put them on the new machine order if needed.

## 2022-08-18 ENCOUNTER — Ambulatory Visit: Payer: Medicare PPO | Admitting: Family Medicine

## 2022-08-18 ENCOUNTER — Encounter: Payer: Self-pay | Admitting: Family Medicine

## 2022-08-18 VITALS — BP 142/82 | HR 55 | Temp 98.0°F | Ht 71.0 in | Wt 231.0 lb

## 2022-08-18 DIAGNOSIS — H10022 Other mucopurulent conjunctivitis, left eye: Secondary | ICD-10-CM | POA: Diagnosis not present

## 2022-08-18 MED ORDER — ERYTHROMYCIN 5 MG/GM OP OINT: 1.0000 | TOPICAL_OINTMENT | Freq: Every day | OPHTHALMIC | 0 refills | Status: DC

## 2022-08-18 MED ORDER — AMOXICILLIN 875 MG PO TABS: 875.0000 mg | ORAL_TABLET | Freq: Two times a day (BID) | ORAL | 0 refills | Status: AC

## 2022-08-18 NOTE — Progress Notes (Signed)
Subjective:    Patient ID: Martin Cordova, male    DOB: December 30, 1940, 82 y.o.   MRN: [id number]  HPI Patient is a very pleasant 82 Caucasian gentleman presents today complaining of left thigh for 2 days.  The conjunctiva is erythematous.  It is draining thick grayish drainage.  His eye is watering constantly.  He states that it burns slightly.  The eyelids are also erythematous and swollen.  His right eye looks normal.  He also reports rhinorrhea for 2 days.  He denies any sinus pain or fever however he is concerned that he may have a sinus infection and he is about to travel over the weekend.  He took a negative COVID test today  Past Medical History:  Diagnosis Date   Allergy    Arthritis    Cataract    Clotting disorder ([place])    Colon polyps    Diabetes mellitus without complication ([place])    Diverticulitis    Diverticulosis    GERD 12/07/2006   HYPERGLYCEMIA 12/07/2006   HYPERLIPIDEMIA 12/07/2006   mixed wth an atherogenic dyslipidemic pattern   HYPERTENSION 12/07/2006   Nephrolithiasis    Nonalcoholic fatty liver disease    Normal coronary arteries 04/06/2007   by cath EF 55%., last ECHO 10/13/10 EF>55% mild MR, last nuc 06/16/10 EF 57% low risk scan   OBESITY 11/10/2009   OSA treated with BiPAP 10/22/2007   PAF (paroxysmal atrial fibrillation) ([place]) 04/09/2013   Palpitations    tachy   Pulmonary HTN ([facility])    RV syst.  40-46mHg- moderate by echo   Renal insufficiency    Cr to 3 on ACE   Sleep apnea    SOB (shortness of breath)    with exertion   THROMBOCYTOPENIA 12/07/2006   TRANSAMINASES, SERUM, ELEVATED 12/07/2006   VENTRICULAR TACHYCARDIA 03/27/2007   monitored by dr. kClaiborne Billings  Past Surgical History:  Procedure Laterality Date   A-FLUTTER ABLATION N/A 04/19/2017   Procedure: A-FLUTTER ABLATION;  Surgeon: KDeboraha Sprang MD;  [facility];  Service: Cardiovascular;  Laterality: N/A;   CARDIAC CATHETERIZATION  04/06/2007   normal cors    CARDIOVERSION N/A 08/05/2013   Procedure: CARDIOVERSION;  Surgeon: TTroy Sine MD;  [facility]  Service: Cardiovascular;  Laterality: N/A;   CARDIOVERSION N/A 12/26/2016   Procedure: CARDIOVERSION;  Surgeon: KTroy Sine MD;  [facility]  Service: Cardiovascular;  Laterality: N/A;   CHOLECYSTECTOMY     CYSTOSCOPY/RETROGRADE/URETEROSCOPY  08/19/2011   Procedure: CYSTOSCOPY/RETROGRADE/URETEROSCOPY;  Surgeon: MHanley Ben MD;  [facility]  Service: Urology;  Laterality: Right;  JJ STENT PLACEMENT   EYE SURGERY     ROTATOR CUFF REPAIR Right    Current Outpatient Medications on File Prior to Visit  Medication Sig Dispense Refill   amiodarone (PACERONE) 200 MG tablet TAKE 1 & 1/2 TABLETS (300 MG) DAILY FOR 3 WEEKS, THEN DECREASE TO 1 TABLET (200 MG) DAILY. 180 tablet 2   apixaban (ELIQUIS) 5 MG TABS tablet TAKE (1) TABLET BY MOUTH TWICE DAILY. 180 tablet 1   atorvastatin (LIPITOR) 10 MG tablet TAKE 1 TABLET BY MOUTH EVERYDAY AT BEDTIME 90 tablet 3   carvedilol (COREG) 12.5 MG tablet Take 1 tablet (12.5 mg total) by mouth 2 (two) times daily with a meal. TAKE 12.50 MG IN THE AM AND 6.'25MG'$  IN THE PM 45 tablet 6   COMIRNATY SUSP injection Inject into the muscle as directed.     diltiazem (  CARDIZEM CD) 240 MG 24 hr capsule TAKE 1 CAPSULE BY MOUTH EVERYDAY AT BEDTIME 90 capsule 3   empagliflozin (JARDIANCE) 10 MG TABS tablet Take 10 mg by mouth daily.     fluticasone (FLONASE) 50 MCG/ACT nasal spray Place 2 sprays into both nostrils daily. 16 g 6   furosemide (LASIX) 40 MG tablet Alternate between taking '40mg'$  (1 tablet) a day and '20mg'$  (1/2 tablet) a day. 90 tablet 3   hydrALAZINE (APRESOLINE) 25 MG tablet Take 0.5 tablets (12.5 mg total) by mouth 2 (two) times daily. 90 tablet 3   levocetirizine (XYZAL) 5 MG tablet TAKE 1 TABLET BY MOUTH EVERY DAY IN THE EVENING 90 tablet 3   omega-3 acid ethyl esters (LOVAZA) 1 g capsule TAKE 2 CAPSULES BY  MOUTH ONCE DAILY. 180 capsule 3   tiZANidine (ZANAFLEX) 4 MG tablet Take 1 tablet (4 mg total) by mouth every 6 (six) hours as needed for muscle spasms. 30 tablet 0   No current facility-administered medications on file prior to visit.   Allergies  Allergen Reactions   Lisinopril Swelling and Other (See Comments)    Renal insufficiency also   Dulaglutide Other (See Comments)   Ibuprofen Other (See Comments)    Per the patient, he has "kidney and liver issues" and is NOT suppose to take this   Social History   Socioeconomic History   Marital status: Married    Spouse name: Not on file   Number of children: 4   Years of education: Not on file   Highest education level: Not on file  Occupational History   Occupation: retired Pharmacist, hospital  Tobacco Use   Smoking status: Former    Packs/day: 0.25    Years: 10.00    Additional pack years: 0.00    Total pack years: 2.50    Types: Cigarettes    Quit date: 08/04/1980    Years since quitting: 42.0   Smokeless tobacco: Former    Types: Chew    Quit date: 08/08/2004  Vaping Use   Vaping Use: Never used  Substance and Sexual Activity   Alcohol use: Not Currently   Drug use: No   Sexual activity: Yes  Other Topics Concern   Not on file  Social History Narrative   Not on file   Social Determinants of Health   Financial Resource Strain: Not on file  Food Insecurity: Not on file  Transportation Needs: Not on file  Physical Activity: Not on file  Stress: Not on file  Social Connections: Not on file  Intimate Partner Violence: Not on file   Family History  Problem Relation Age of Onset   Diabetes Mother    Heart failure Mother    Stroke Mother    Kidney failure Father    Diabetes Father    Cancer Paternal Uncle    Colon cancer Neg Hx    Esophageal cancer Neg Hx    Stomach cancer Neg Hx    Rectal cancer Neg Hx      Review of Systems     Objective:   Physical Exam Vitals reviewed.  Constitutional:      General: He is  not in acute distress.    Appearance: He is not diaphoretic.  HENT:     Head: Normocephalic and atraumatic.     Right Ear: Tympanic membrane, ear canal and external ear normal.     Left Ear: Tympanic membrane, ear canal and external ear normal.     Nose: Congestion and  rhinorrhea present.     Mouth/Throat:     Pharynx: No oropharyngeal exudate.  Eyes:     General: No scleral icterus.       Right eye: No discharge.        Left eye: Discharge present.    Conjunctiva/sclera:     Left eye: Left conjunctiva is injected. Exudate present.     Pupils: Pupils are equal, round, and reactive to light.  Cardiovascular:     Rate and Rhythm: Normal rate. Rhythm irregular.     Heart sounds: Normal heart sounds. No murmur heard.    No friction rub. No gallop.  Pulmonary:     Effort: Pulmonary effort is normal. No respiratory distress.     Breath sounds: Normal breath sounds. No stridor. No wheezing or rales.  Chest:     Chest wall: No tenderness.  Musculoskeletal:     Cervical back: Normal range of motion and neck supple.  Lymphadenopathy:     Cervical: No cervical adenopathy.  Neurological:     Mental Status: He is alert.           Assessment & Plan:  Pink eye disease of left eye Patient has conjunctivitis in his left eye.  Begin erythromycin ophthalmic ointment.  He can apply 1/2 inch ribbon into the left eye once daily for the next 5 days.  I explained that the pinkeye is likely viral and very contagious however he would still like to try the ointment to help potentially treat.  I believe he also has a viral upper respiratory infection.  He does not require antibiotics for this.  I did give the patient amoxicillin and explained him not to fill the prescription unless he develops high fever or sinus pain.  I explained that most viral upper respiratory infections improve over 7 days without antibiotics.

## 2022-09-01 ENCOUNTER — Ambulatory Visit: Payer: Medicare PPO | Attending: Cardiovascular Disease | Admitting: Cardiovascular Disease

## 2022-09-01 ENCOUNTER — Encounter: Payer: Self-pay | Admitting: Cardiovascular Disease

## 2022-09-01 DIAGNOSIS — I480 Paroxysmal atrial fibrillation: Secondary | ICD-10-CM

## 2022-09-01 DIAGNOSIS — G4733 Obstructive sleep apnea (adult) (pediatric): Secondary | ICD-10-CM

## 2022-09-01 DIAGNOSIS — E669 Obesity, unspecified: Secondary | ICD-10-CM

## 2022-09-01 DIAGNOSIS — Z7901 Long term (current) use of anticoagulants: Secondary | ICD-10-CM

## 2022-09-01 DIAGNOSIS — I4510 Unspecified right bundle-branch block: Secondary | ICD-10-CM | POA: Diagnosis not present

## 2022-09-01 DIAGNOSIS — E1122 Type 2 diabetes mellitus with diabetic chronic kidney disease: Secondary | ICD-10-CM

## 2022-09-01 DIAGNOSIS — I10 Essential (primary) hypertension: Secondary | ICD-10-CM | POA: Diagnosis not present

## 2022-09-01 DIAGNOSIS — N184 Chronic kidney disease, stage 4 (severe): Secondary | ICD-10-CM

## 2022-09-01 MED ORDER — HYDRALAZINE HCL 25 MG PO TABS: 37.5000 mg | ORAL_TABLET | Freq: Two times a day (BID) | ORAL | 3 refills | Status: DC

## 2022-09-01 NOTE — Patient Instructions (Addendum)
Medication Instructions:   Hydralazine 37.5 mg  ( 1 and 1/2 tablet  of 25 mg ) twice a day    *If you need a refill on your cardiac medications before your next appointment, please call your pharmacy*   Lab Work: Not  needed    Testing/Procedures:  Your physician has recommended that you have a sleep study-  Bi-PAP/ASV titration.  This test records several body functions during sleep, including: brain activity, eye movement, oxygen and carbon dioxide blood levels, heart rate and rhythm, breathing rate and rhythm, the flow of air through your mouth and nose, snoring, body muscle movements, and chest and belly movement.   Follow-Up: At [facility], you and your health needs are our priority.  As part of our continuing mission to provide you with exceptional heart care, we have created designated Provider Care Teams.  These Care Teams include your primary Cardiologist (physician) and Advanced Practice Providers (APPs -  Physician Assistants and Nurse Practitioners) who all work together to provide you with the care you need, when you need it.     Your next appointment:    4 to 5 month(s)  The format for your next appointment:   In Person  Provider:   Shelva Majestic, MD    Other Instructions  You received a AirTouch large mask today

## 2022-09-01 NOTE — Progress Notes (Signed)
Patient ID: Martin Cordova, male   DOB: 1940-07-21, 82 y.o.   MRN: [id number]       HPI: Martin Cordova is a 82 y.o. male is who presents to the office today for a 25month follow-up cardiology clinic evaluation.  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 In the past, he developed an obstructive uropathy attributed to kidney stones; creatinine had risen up to 3 and ultimately improved and stabilized at approximately 1.7. [name] uses his BiPAP daily and does note good sleep.  In the past he had issues with mild peripheral edema, which ultimately improved.  He had been previously taken off his lisinopril when his creatinine had risen in the setting of his obstructive uropathy.  In 2015 he developed recurrent episodes of palpitations and was found to have recurrent paroxysmal atrial fibrillation/flutter.  His medications were adjusted and  he was started on antiarrhythmic therapy with Rythmol to take in addition to his increasing doses of carvedilol.  He was started on Eliquis for anticoagulation.  He underwent successful cardioversion on 08/05/2013.  Since that time, he is unaware of any recurrent atrial fibrillation.  He has noticed more energy. He had been maintained on 50 mg twice a day of carvedilol in addition to Rythmol 225 mg every 8 hours, but due to slow pulse rates, these doses have ultimately been reduced.  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 He has been on CPAP therapy initially for approximately 8 years and has been on BiPAP auto SV for over 4 years. He admits to using his BiPAP with 100% compliance.  He obtained a new BiPAP machine in June.  He feels that his machine is  significantly improved from his prior one. When I last saw him I reviewed his  download of his new BiPAP auto SV machine indicates that 90% of the time is EPAP pressure was 17 and 90% of the time his pressure support was 5.8, giving an IPAP of 21.8 cm.  He is using it 100% of the time and is averaging 5 hours and 49 minutes on his most recent download from 12/07/2013 through 01/05/2014.  His average AHI was 7.5.  His device setting maximum BiPAP pressure is 25 cm in maximum EPAP pressure 21 cm.  His average central event index is 1.3, and average obstructive apneic index 0.2, with an average copy index of 6.0.  Average breath.  Rate is 18 breaths per minute with a minute ventilation of 12.1.  Average total body may 670 mL.  When I saw him on 10/21/15 he was unaware of any arrhythmia.  He was on carvedilol 12.5 mg twice a day, Rythmol 225 mg twice a day, hydralazine 25 mg twice a day and eliquis  5 mg twice a day.  He also has been taking omeprazole for GERD and atorvastatin 10 mg for hyperlipidemia. He was found to have recurrent atrial flutter with variable block.  At that time, I recommended that he increase his Rythmol and take 225 mg every 8 hours and further increased his carvedilol to 18.75 mg twice a day.  Laboratory had revealed a magnesium of 2.0.  He has stage III chronic kidney disease  and his BUN was 26, creatinine 1.71 with a GFR estimate of 39.  Potassium was 4.8.  TSH was normal at 2.4.  Insomnia.  One week later in follow-up he had reverted back to sinus rhythm.  When seen in September 2017, he was maintaining sinus rhythm.  Since then, he admits to weight gain.  In early April, he began to notice some increasing shortness of breath and elevated heart rate and fatigue.  He was seen by [name] on 09/07/2016 and was found to be back in atrial flutter with a ventricular rate at 90.  He has been on eloquence 5 g twice a day for anticoagulation and was on carvedilol 18.75 twice a day.  Carvedilol dose  was increased to 25 mg twice a day.  He was also told at that time to try increasing his Propofenone to 300 mg 3 times a day.  He took this for several days but did not tolerate the increased dose and reduced it back to its previous dose of 225 mg every 8 hours.  When I saw him on 10/20/2016  I increased carvedilol to 37.5 mg twice a day.  He has continued to use his BiPAP therapy for sleep apnea.    When I saw him in June 2018 he was still in atrial flutter with 3:1 block.  I discontinued hydralazine and started him on Cardizem CD 180 mg, both for blood pressure and potential antiarrhythmic therapy. He also was having ankle edema and I started him on low-dose hydrochlorothiazide.  His peripheral edema significantly improved.  He has felt better.  He denied episodes of chest tightness.  He underwent successful outpatient DC cardioversion by me on 12/26/2016.  He successfully cardioverted at 120 joules.  When he returned for a follow-up evaluation on 01/12/2017 and saw  [name], [facility] .  He was back in atrial flutter.  At that time his propafenone was discontinued.  Over the past several months, [name] has felt well.  At times there is some trace swelling in his ankles.  He continues to use his BiPAP with 100% compliance.  He was hospitalized on September 19 with an episode of diverticulitis and was discharged on 02/24/2017.   When I saw him in October 2018, he was back in atrial flutter.  At that time, I recommended consideration for atrial flutter ablation.  He was evaluated by Dr. Caryl Comes and on 04/19/2017 underwent successful atrial flutter ablation across the caval tricuspid isthmus, which interrupted bidirectional conduction.  Initial plan was for him to continue anticoagulation for 4 weeks, but unfortunately he developed irregular tachycardia palpitations about 48 hours after the procedure.  He's been found to have paroxysmal atrial fibrillation.  He as result is on chronic anticoagulation therapy.   In November 2018 an echo Doppler study showed an EF of 40-45%.  He has been on carvedilol 25 mg twice a day and takes an extra carvedilol as needed.  He has had recurrent short burst of atrial fibrillation, but had an episode for almost an entire day.  He received a new BiPAP machine after his previous machine completely malfunction.  He admits to 100% compliance.  [facility] is his DME company.  He has diabetes mellitus and was started back on Tradjenta for hemoglobin A1c of 7.    He has a [employer] Dream [employer] BiPAP auto SV unit. I obtained a download from September 02, 2017 through October 01, 2017.  He is compliant.  His average CPAP pressure  was 16.8 and average pressure support pressure was 4.1.  90% of the time device EPAP was 17 cm.  AHI, however, was still mildly increased at 8.9.  He is unaware of breakthrough snoring.  He denies restless legs.  He denies chest pain.  He has noticed some mild episodes of dizziness in the morning.    I recommended some changes to his BiPAP therapy and change his ramp time to 5 minutes, increased but pressure support to 5.5 increased his EPAP to 9 cm water pressure.  A download was obtained from June 12 through December 14, 2017.  He is 100% compliant and averaging his hours and 24 minutes of CPAP use per night.  His average AHI is still slightly elevated 8.2.  90% of time device CPAP was 17 cm water pressure.  Though his pressure support range can from 0 to 5.5 cm, he apparently has only been needing a pressure support of 2.4 cm of water.  He feels well.  He has a F&P medium cushion full facemask but he is an oral breather and when he opens his mouth the facemask is actually too small which may be contributing to some of his increased AHI.  He presents to sleep clinic for further evaluation. He had been evaluated by Dr. Jimmy Footman for his renal issues.  He denies any awareness of recurrent atrial flutter.  He has seen Dr. Dennard Schaumann here he also has been diagnosed with  monoclonal gammopathy of undetermined significance and is monitored by Dr. Irene Limbo.  He had undergone a cardiac MRI which was ordered by Dr. Caryl Comes last year in February 2019 which showed normal LV size and function with an EF of 54%.  There was no delayed gadolinium uptake on inversion recovery sequences.  He had a trileaflet aortic valve with mild AR, mild MR, moderate RV enlargement, moderate biatrial enlargement and there was moderate LVH with the mid and basal septum measuring 14 mm compared to the posterior wall at 9 mm.  I last saw him in February 2020 at which time he denied any recurrent chest pain or abnormal heart rhythm.  He was using his BiPAP auto SV with 100% compliance.  A download was obtained in the office from January 26 through July 30, 2018.  He is 100% compliant.  He requires high pressures and his average EPAP pressure was 17 which also was his 90% pressure.  He is set up to a potential maximum IPAP pressure of 30.  His pressure support ranges from 0-5.5 with an average pressure support of 2.4 cm.  He is now using a large mask which is much more comfortable and is he is doing better.  On his most recent download, his AHI was still mildly increased at 7.1/h.  He has undergone follow-up hematologic evaluation with Dr. Velvet Bathe for his monoclonal gammopathy.    I saw him on April 05, 2019.  Since his prior evaluation he felt that he may have had 3 brief short-lived episodes of PAF which resolved spontaneously.  He denied any associated chest pain. He underwent Mohs surgery for a skin cancer on his chin.  He was unable to use BiPAP for several days since this is where his mask would rest.  I obtained a download from July 1 through April 04, 2019.  His minimum EPAP pressure was 17 with a maximum IPAP pressure of 30 and pressure support of 5.5 cm.  Apparently his ramp start pressure is 9.5 cm.  Average AHI is 6.9.  90% of the time his device EPAP pressure was 17.  Average breath rate was 16.1  breaths/min.   Echo Doppler study on January 25, 2019 showed an EF of 60 to 65% with mild LVH.  Normal diastolic parameters.  There was moderate aortic sclerosis without stenosis and mild AR.  I saw him on Oct 08, 2019 at which time he felt well.  He is followed by nephrology with stage IV chronic kidney disease with recent creatinine in February 2021 at 2.59.  He was unaware of any recurrent atrial fibrillation or flutter.  He continues to use BiPAP auto SV therapy with 100% compliance.  He is followed by Dr. Buddy Duty for his diabetes mellitus.   I saw him in November 2021 at which time he remained stable.  He is now followed by Dr. Baird Cancer with the imminent retirement of Dr. Baird Cancer.  Laboratory from April 03, 2020 did show improvement in his creatinine which typically range between 2 and 2.5 and was 1.99.  He denies any significant shortness of breath.  At times he has noticed.  Short brief episodes of possible PAF which self resolved.  His medical regimen has included carvedilol 25 mg twice a day, diltiazem 240 mg daily, furosemide 40 mg for blood pressure and heart rate control.  He is diabetic on Tradjenta.  He has been on Eliquis without bleeding for anticoagulation and continues to be on low-dose atorvastatin and lovaza for hyperlipidemia.  He has a [employer] BiPAP auto SV machine and I discussed with him the recall with a very small risk of Styrofoam breakdown contributed by possible ozone related cleaning cleansing agents.  He had contacted [employer] and is on a list for a new machine.    I saw him for follow-up evaluation on December 04, 2020 and last saw him 3 weeks later on December 21, 2020.  Since his July 1 evaluation he had noticed several months of increased fatigability as well as some mild leg swelling right greater than left.  He denied any chest pain.  He has continued to use his BiPAP ASV.  I obtained a download in the office from June 1 through December 03, 2020 which shows 100% use.  AHI was 5.8  and is 90% EPAP was 17 and 90% pressure support was 3.3 cm.  He denies any chest pain.  He is unaware of rapid palpitations.  During that evaluation, his ECG confirmed that he was in atrial fibrillation with ventricular rate at 79 with right bundle branch block and PVCs.  I suspected that his atrial fibrillation may have been of several months duration and he had been on diltiazem CD2 140 mg daily, carvedilol 25 mg twice a day, and furosemide 40 mg daily both for rate control, edema and blood pressure control.  With his recurrent atrial fibrillation I recommended institution of amiodarone and started him on 200 mg twice a day.  I discussed potential adverse consequences of amiodarone and the importance of monitoring LFTs and thyroid function.  I scheduled him for laboratory and recommended a follow-up echo Doppler study.  He states he is prescription filled and started his therapy on December 08, 2020.  After several days he seemed to notice slightly more energy.  He is unaware of palpitations.   When I saw him on December 21, 2020, his ECG confirmed that he had converted to sinus rhythm.  At that time, I recommended he continue to take amiodarone 200 mg twice a day with plans to eventually reduce  his dosing.  He underwent a follow-up echo Doppler study on December 23, 2020 which showed normal LV function with EF at 60 to 65%, mild concentric LVH, grade 2 diastolic dysfunction, and there was evidence for moderate left atrial dilatation and mild right atrial dilatation.  There was mild aortic sclerosis without stenosis.  He now sees Dr. Joelyn Oms for his CKD.  At his March 17, 2021 follow-up evaluation he remained stable and denied any chest pain or shortness of breath, dizziness, presyncope or syncope. He is seeing Dr. Dennard Schaumann.  He is no longer on Tradjenta but is now on Farxiga for his diabetes mellitus.  He continues to be on Eliquis 5 mg twice a day, carvedilol 25 mg twice a day, diltiazem 240 mg daily as well as  amiodarone 200 mg twice a day.  He has been taking furosemide 40 alternating with 20 mg every other day.  He is on atorvastatin and Lovaza for mixed hyperlipidemia.  His ECG revealed sinus bradycardia and I recommended reduction of his amiodarone dose from 40 mg daily down to 300 mg for 3 weeks and further reduction to 200 mg daily.  He continued to be on carvedilol 25 mg twice a day and long-acting diltiazem 240 mg daily.  He sees Dr. Joelyn Oms for his CKD and in July 2022 creatinine had increased to 2.24.  He continues to use BiPAP with 100% compliance.  I saw him on May 19, 2021 at which time he felt well.  Ten days previously  he tested positive for COVID and had mild URI symptoms without fever but did lose his sense of taste for several days.  He presently feels well.  He is unaware of any definitive breakthrough atrial fibrillation but he had noticed transient mild irregularity to heart rhythm last evening.  He continues to be on Eliquis 5 mg twice a day, amiodarone 20 mg daily, carvedilol 25 mg twice a day, diltiazem 240 mg, and has been taking furosemide 40 alternating with 20 mg every other day.  He had undergone recent laboratory on November 11 which showed creatinine at 2.2.  LDL cholesterol in July was 48.  During that evaluation, blood pressure was stable.  He was maintaining sinus rhythm.  I last saw him on April 10, 2022 and he continued to do well. Marland Kitchen  He currently is on amiodarone 200 mg alternating with 300 mg every other day, carvedilol 12.5 mg twice a day, diltiazem 240 mg daily, Eliquis 5 mg twice a day, furosemide 20 mg alternating with 40 mg on alternating days, Jardiance 10 mg, and Lovaza 2 capsules twice a day in addition to atorvastatin 10 mg at bedtime.  He is unaware of recurrent atrial fibrillation.  He continues to see Dr. Buddy Duty for endocrine.  He admits to some fatigability.  He is being followed by Dr. Joelyn Oms of nephrology with creatinine July 2023 at 2.68.  He continues to be  on BiPAP with 100% use.  During that evaluation, his blood pressure was elevated and he was bradycardic.  I recommended he reduce carvedilol to 12.5 mg in the morning and 6.2 5 at night and I initiated low-dose hydralazine to 12.5 mg twice a day.  Since I last saw him, he has continued to be followed by Dr. Joni Fears of nephrology.  Recently his hydralazine dose was increased to 25 mg twice a day by Dr. Joelyn Oms.  He is now on amiodarone 200 mg daily, carvedilol 12.5 mg in the morning and 6.25 mg in the  evening, diltiazem CD2 140 mg daily, furosemide 40 mg alternating with 20 mg every other day, hydralazine 25 mg twice a day, Jardiance 10 mg daily.  He is on atorvastatin 10 mg and Lovaza 2 capsules daily.  He continues to use BiPAP auto SV and has a [employer]ics dream ST auto SV unit which is not Office manager.  He admits to 100% compliance.  He states his current mask is too small.  He brought his machine with him to the office.  He admits to fatigue.  An Epworth Sleepiness Scale score was calculated today and this endorsed at 12 consistent with daytime sleepiness.  I was able to interrogate his machine; usage is 100%.  However average use per day is 4.6 hours.  AHI remains elevated at 25.7/h.  He presents for evaluation.  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 Claiborne Billings   Diabetes mellitus without complication    Renal insufficiency     Cr to 3 on ACE   Nephrolithiasis    Normal coronary arteries 04/06/2007    by cath EF 55%., last ECHO 10/13/10 EF>55% mild MR, last nuc 06/16/10 EF 57% low risk scan   Pulmonary HTN     RV syst.  40-25mmHg- moderate by echo   Palpitations     tachy    PAF (paroxysmal atrial fibrillation) 04/09/2013    Past Surgical History  Procedure Laterality Date   Cholecystectomy     Rotator cuff repair     Cystoscopy/retrograde/ureteroscopy  08/19/2011    Procedure: CYSTOSCOPY/RETROGRADE/URETEROSCOPY;  Surgeon: Hanley Ben, MD;  Location: [facility];  Service: Urology;  Laterality: Right;  JJ STENT PLACEMENT    Cardiac catheterization  04/06/2007    normal cors    Allergies  Allergen Reactions   Lisinopril     Renal insufficiency    Current Outpatient Medications:    amiodarone (PACERONE) 200 MG tablet, TAKE 1 & 1/2 TABLETS (300 MG) DAILY FOR 3 WEEKS, THEN DECREASE TO 1 TABLET (200 MG) DAILY., Disp: 180 tablet, Rfl: 2   apixaban (ELIQUIS) 5 MG TABS tablet, TAKE (1) TABLET BY MOUTH TWICE DAILY., Disp: 180 tablet, Rfl: 1   atorvastatin (LIPITOR) 10 MG tablet, TAKE 1 TABLET BY MOUTH EVERYDAY AT BEDTIME, Disp: 90 tablet, Rfl: 3   carvedilol (COREG) 12.5 MG tablet, Take 1 tablet (12.5 mg total) by mouth 2 (two) times daily with a meal. TAKE 12.50 MG IN THE AM AND 6.25MG  IN THE PM, Disp: 45 tablet, Rfl: 6   diltiazem (CARDIZEM CD) 240 MG 24 hr capsule, TAKE 1 CAPSULE BY MOUTH EVERYDAY AT BEDTIME, Disp: 90 capsule, Rfl: 3   empagliflozin (JARDIANCE) 10 MG TABS tablet, Take 10 mg by mouth daily., Disp: , Rfl:    fluticasone (FLONASE) 50 MCG/ACT nasal spray, Place 2 sprays into both nostrils daily., Disp: 16 g, Rfl: 6   furosemide (LASIX) 40 MG tablet, Alternate between taking 40mg  (1 tablet) a day and 20mg  (1/2 tablet) a day., Disp: 90 tablet, Rfl: 3   omega-3 acid ethyl esters (LOVAZA) 1 g capsule, TAKE 2 CAPSULES BY MOUTH ONCE DAILY., Disp: 180 capsule, Rfl: 3   hydrALAZINE (APRESOLINE) 25 MG  tablet, Take 1.5 tablets (37.5 mg total) by mouth 2 (two) times daily., Disp: 270 tablet, Rfl: 3  Socially he is married has 4 children and 5 grandchildren. He is a distant relative to the "Nix brothers." He does try to walk and  exercise. There is no tobacco use. He does drink occasional alcohol.   ROS General: Negative; No fevers, chills, or night sweats;  HEENT: Negative; No changes in vision or hearing, sinus congestion, difficulty swallowing Pulmonary: Negative; No cough, wheezing, shortness of breath, hemoptysis Cardiovascular: See history of present illness No recent peripheral edema GI: Status post recent episode of diverticulitis requiring 2 day hospitalization GU:  No dysuria, hematuria, or difficulty voiding; some difficulty with erectile function Musculoskeletal: Negative; no myalgias, joint pain, or weakness Hematologic/Oncology: Positive M spike with monoclonal gammopathy of undetermined significance; history of thrombocytopenia Endocrine: Negative; no heat/cold intolerance; no diabetes Neuro: Negative; no changes in balance, headaches Skin: Negative; No rashes or skin lesions Psychiatric: Negative; No behavioral problems, depression Sleep: He is using his BiPAP Auto SV therapy with 100% compliance.  No snoring, daytime sleepiness, hypersomnolence, bruxism, restless legs, hypnogognic hallucinations, no cataplexy Other comprehensive 14 point system review is negative.  PE BP 128/84 (BP Location: Right Arm, Patient Position: Sitting, Cuff Size: Large)   Pulse (!) 54   Ht 5\' 11"  (1.803 m)   Wt 229 lb 12.8 oz (104.2 kg)   SpO2 94%   BMI 32.05 kg/m    Repeat blood pressure by me was 150/84.  He states blood pressure at home typically is around [id number] systolically  Wt Readings from Last 3 Encounters:  09/01/22 229 lb 12.8 oz (104.2 kg)  08/18/22 231 lb (104.8 kg)  05/13/22 228 lb (103.4 kg)   General: Alert, oriented, no distress.  Skin: normal turgor, no rashes, warm and dry HEENT: Normocephalic, atraumatic. Pupils equal round and reactive to light; sclera anicteric; extraocular muscles intact;  Nose without nasal septal hypertrophy Mouth/Parynx benign; Mallinpatti scale 3/4 Neck: No JVD, no  carotid bruits; normal carotid upstroke Lungs: clear to ausculatation and percussion; no wheezing or rales Chest wall: without tenderness to palpitation Heart: PMI not displaced, RRR, s1 s2 normal, 1/6 systolic murmur, no diastolic murmur, no rubs, gallops, thrills, or heaves Abdomen: soft, nontender; no hepatosplenomehaly, BS+; abdominal aorta nontender and not dilated by palpation. Back: no CVA tenderness Pulses 2+ Musculoskeletal: full range of motion, normal strength, no joint deformities Extremities: no clubbing cyanosis or edema, Homan's sign negative  Neurologic: grossly nonfocal; Cranial nerves grossly wnl Psychologic: Normal mood and affect    September 01, 2022 ECG (independently read by me): Sinus bradycardia at 54, 1st degree block, PR 226 msec, RBBB, LAHB   April 11, 2022 ECG (independently read by me): Sinus bradycardia at 48, RBBB, 1st degree block, PR 246 msec, T wave abnormality  May 19, 2021 ECG (independently read by me): Sinus bradycardiia at 53, RBBB, LAHB  March 17, 2021 ECG (independently read by me): SInus bradycardia at 45, 1st degree AV block, PR 216 msec; RBBB  December 21, 2020 ECG (independently read by me): Sinus bradycardia at 55, 1st degree AV block; PR 228 msec; RBBBB   December 04, 2020 ECG (independently read by me): Atrial fibrillation at 79, RBBB, PVC  May 04, 2020 ECG (independently read by me): Sinus bradycardia at 56, PVC, RBBB  May 4, 2021ECG (independently read by me): Normal sinus rhythm at 63 bpm with premature complex.  Right bundle branch block with repolarization changes.  Normal intervals.  April 05, 2019 ECG (independently read by me): Sinus Bradycardia ay 55: RBBB with repolarization.  February 25,2020 ECG (independently read by me): Sinus bradycardia at 56 bpm.  Right bundle branch block with repolarization changes.  October 03, 2017 ECG (independently read by me):  Sinus bradycardia at 50 bpm.  Right bundle branch block with  repolarization changes. QTc interval 461 ms.  January 2019 ECG (independently read by me): Sinus bradycardia 56 bpm.  Possible left atrial enlargement.  Right bundle branch block with repolarization changes.  PR interval 186 ms; QTc interval 436 ms  October 2018 ECG (independently read by me): Atrial flutter with variable block at 96 bpm.  Right bundle-branch block with repolarization changes.  QTc interval 437 ms  July 2018 ECG (independently read by me): Probable atrial flutter with a ventricular rate at 82 bpm.  Right bundle-branch block, left anterior hemiblock.  11/08/2016 ECG (independently read by me): Probable atrial flutter 3:1 block , ventricular rate at 85;  right bundle branch block with repolarization changes.  QTc interval 528 ms.   May 2018 ECG (independently read by me): atrial flutter with variable block with ventricular rate at 89 bpm.  Right bundle-branch block, left anterior hemiblock.  September 2017 ECG (independently read by me): Normal sinus rhythm at 63 bpm.  First-degree AV block with a PR interval of 208 ms.  Right bundle branch block with repolarization changes.  QTc interval 466 ms.  10/28/15 ECG (independently read by me): Sinus bradycardia at 54 bpm.  First degree block with PR interval 222 ms.  Right bundle-branch block.  10/21/2015 ECG (independently read by me): Atrial flutter with variable block.  Right bundle-branch block with repolarization changes.  October 2016 ECG (independently read by me): sinus rhythm with first-degree AV block with a PR interval at 216 ms.  Ventricular rate 86 bpm with occasional PVCs.    May 2016 ECG (independently read by me): Normal sinus rhythm at 60 bpm.  Right bundle-branch block with repolarization changes.  November 2015 ECG (independently read by me): Normal sinus rhythm at 61.  Nonspecific T abnormality.  QTc interval 394 ms.  11/18/2013 ECG (independently read by me): Sinus bradycardia 58 beats per minute.  PR interval  198 ms; QTc interval 371 ms.  Nonspecific ST changes.  07/29/2013 ECG  (independently read by me): Atrial flutter with 2:1 block with a ventricular rate of 87 beats per minute. Atrial rate is approximately 360 ms. 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 fib flutter now with right bundle branch block and repolarization changes.  Prior ECG of 06/04/2013: EKG  suggests probable atrial flutter with a ventricular rate of 105 beats per minute. There also are frequent PVCs. QTc interval is 436 ms. They're nonspecific T changes.  LABS:    Latest Ref Rng & Units 10/22/2021    9:53 AM 04/16/2021    9:17 AM 12/04/2020   10:21 AM  BMP  Glucose 70 - 99 mg/dL 101  126  119   BUN 8 - 27 mg/dL 26  28  39   Creatinine 0.76 - 1.27 mg/dL 2.69  2.20  2.24   BUN/Creat Ratio 10 - 24 10  13  17    Sodium 134 - 144 mmol/L 142  143  142   Potassium 3.5 - 5.2 mmol/L 5.1  4.9  4.8   Chloride 96 - 106 mmol/L 107  106  101   CO2 20 - 29 mmol/L 24  25  24   Calcium 8.6 - 10.2 mg/dL 10.0  9.8  10.5       Latest Ref Rng & Units 10/22/2021    9:53 AM 04/16/2021    9:17 AM 12/04/2020   10:21 AM  Hepatic Function  Total Protein 6.0 - 8.5 g/dL 6.6  7.0  7.1   Albumin 3.7 - 4.7 g/dL 4.3  4.3  4.6   AST 0 - 40 IU/L 22  28  21    ALT 0 - 44 IU/L 37  48  43   Alk Phosphatase 44 - 121 IU/L 60  61  51   Total Bilirubin 0.0 - 1.2 mg/dL 0.6  0.5  0.8       Latest Ref Rng & Units 10/22/2021    9:53 AM 04/16/2021    9:17 AM 12/04/2020   10:21 AM  CBC  WBC 3.4 - 10.8 x10E3/uL 5.1  5.5  5.6   Hemoglobin 13.0 - 17.7 g/dL 14.9  14.1  15.5   Hematocrit 37.5 - 51.0 % 44.6  43.0  46.6   Platelets 150 - 450 x10E3/uL 99  125  128    Lab Results  Component Value Date   TSH 3.100 10/22/2021   Lipid Panel     Component Value Date/Time   CHOL 108 12/22/2020 0847   TRIG 153 (H) 12/22/2020 0847   HDL 36 (L) 12/22/2020 0847   CHOLHDL 3.0 12/22/2020 0847   VLDL  37 (H) 10/25/2016 0853   LDLCALC 48 12/22/2020 0847   LDLDIRECT 65.0 04/12/2013 0825     RADIOLOGY: No results found.  IMPRESSION:  1. Essential hypertension   2. OSA treated with BiPAP Auto SV   3. PAF (paroxysmal atrial fibrillation) ([place])   4. Right bundle branch block   5. CKD (chronic kidney disease) stage 4, GFR 15-29 ml/min ([facility])   6. Type 2 diabetes mellitus with chronic kidney disease, without long-term current use of insulin, unspecified CKD stage ([place])   7. Anticoagulation adequate   8. Mild obesity     ASSESSMENT AND PLAN: Mr. Rzepecki is a 82 year-old Caucasian male who has documented normal coronary arteries by cardiac catheterization in 2008. He has a history of hypertension, mixed hyperlipidemia, and has paroxysmal atrial fibrillation/flutter. He has a history of diabetes mellitus and after he had lost a significant amount of weight and with renal insufficiency he was taken off his diabetic medication.  He had been started back on Tradjenta for his diabetes mellitus but since his most recent evaluation this has been discontinued and he was on Farxiga, and now most recently on Jardiance 10 mg daily. He underwent successful catheter ablation for recurrent atrial flutter in November 2018 and remains on long-term anticoagulation with Eliquis.  He denies recurrent arrhythmia.  Dr Caryl Comes had ordered an MRI of his heart in February 2019 which did not reveal any delayed gadolinium uptake arguing against scar, infiltration or hypertrophic cardiomyopathy.  There was no SAM.  Since his evaluation with me in November 2021, he had noticed more fatigability.  At his office visit on December 04, 2020 ECG confirmed that he was in atrial fibrillation which I suspect was of several months duration.  At the time he already was on carvedilol 25 mg twice a day, Cardizem CD 240 mg daily, and furosemide 40 mg daily.  I initiated amiodarone 200 mg twice a day which he started on December 08, 2020.   When I saw  him several weeks later on  December 21, 2020 he felt improved and ECG confirmed that he had not converted to sinus rhythm.  At his last evaluation in October 2022 he was maintaining sinus rhythm but was bradycardic with heart rate at 45.  At that time, his dose of amiodarone was reduced.  When last seen by me in November 2023, he continued to be bradycardic and his carvedilol dose was reduced.  Blood pressure was elevated and he was initiated on low-dose hydralazine.  Subsequently, his dose has been increased slightly by Dr. Baird Cancer who follows his renal function.  Creatinine was 2.68 in July 2023.  Presently, he is on a medical regimen of amiodarone 200 mg daily, carvedilol 12.5 mg in the morning and 6.25 mg at night, diltiazem CD2 140 mg, furosemide 40 alternating with 20 mg every other day hydralazine 25 mg twice a day and Jardiance 10 mg daily.  He states his blood pressure at home typically is around [id number] systolic.  Blood pressure by me today was 150/84.  I have recommended further titration of hydralazine to 37.5 mg twice a day.  He continues to be on atorvastatin and Lovaza for mixed hyperlipidemia.  He has continued to use his Respironics dream ST auto SV unit.  Uses 100%; however usage is suboptimal per night and only 4.6 hours.  His AHI is increased to 25.7.  He has been using an AirTouch F20 medium style mask.  I provided him with a new AirTouch F20 large mask today in the office.  I have recommended we reevaluate his complex sleep apnea and I am scheduling him for BiPAP/ASV titration prior to him receiving a new machine.  He is maintaining sinus rhythm.  I will see him in 4 to 5 months for reevaluation.     Shelva Majestic, MD  09/07/2022  9:03 AM

## 2022-09-07 ENCOUNTER — Other Ambulatory Visit: Payer: Self-pay | Admitting: *Deleted

## 2022-09-07 ENCOUNTER — Encounter: Payer: Self-pay | Admitting: Cardiovascular Disease

## 2022-09-07 ENCOUNTER — Other Ambulatory Visit: Payer: Self-pay | Admitting: Cardiovascular Disease

## 2022-09-07 DIAGNOSIS — G4733 Obstructive sleep apnea (adult) (pediatric): Secondary | ICD-10-CM

## 2022-09-07 DIAGNOSIS — I10 Essential (primary) hypertension: Secondary | ICD-10-CM

## 2022-09-07 DIAGNOSIS — I480 Paroxysmal atrial fibrillation: Secondary | ICD-10-CM

## 2022-09-07 DIAGNOSIS — I272 Pulmonary hypertension, unspecified: Secondary | ICD-10-CM

## 2022-09-23 ENCOUNTER — Other Ambulatory Visit: Payer: Self-pay | Admitting: Cardiovascular Disease

## 2022-09-27 ENCOUNTER — Ambulatory Visit (HOSPITAL_BASED_OUTPATIENT_CLINIC_OR_DEPARTMENT_OTHER): Payer: Medicare PPO | Attending: Cardiovascular Disease | Admitting: Cardiovascular Disease

## 2022-09-27 VITALS — Ht 71.0 in | Wt 227.0 lb

## 2022-09-27 DIAGNOSIS — I272 Pulmonary hypertension, unspecified: Secondary | ICD-10-CM | POA: Diagnosis not present

## 2022-09-27 DIAGNOSIS — G4733 Obstructive sleep apnea (adult) (pediatric): Secondary | ICD-10-CM | POA: Diagnosis present

## 2022-09-27 DIAGNOSIS — I10 Essential (primary) hypertension: Secondary | ICD-10-CM | POA: Insufficient documentation

## 2022-10-06 DIAGNOSIS — E1122 Type 2 diabetes mellitus with diabetic chronic kidney disease: Secondary | ICD-10-CM | POA: Diagnosis not present

## 2022-10-06 DIAGNOSIS — N184 Chronic kidney disease, stage 4 (severe): Secondary | ICD-10-CM | POA: Diagnosis not present

## 2022-10-06 DIAGNOSIS — I4891 Unspecified atrial fibrillation: Secondary | ICD-10-CM | POA: Diagnosis not present

## 2022-10-06 DIAGNOSIS — N2581 Secondary hyperparathyroidism of renal origin: Secondary | ICD-10-CM | POA: Diagnosis not present

## 2022-10-06 DIAGNOSIS — I129 Hypertensive chronic kidney disease with stage 1 through stage 4 chronic kidney disease, or unspecified chronic kidney disease: Secondary | ICD-10-CM | POA: Diagnosis not present

## 2022-10-13 ENCOUNTER — Encounter (HOSPITAL_BASED_OUTPATIENT_CLINIC_OR_DEPARTMENT_OTHER): Payer: Self-pay | Admitting: Cardiovascular Disease

## 2022-10-13 NOTE — Procedures (Signed)
Patient Name: Martin Cordova, Martin Cordova Date: 09/27/2022 Gender: Male D.O.B: [birth year] Age (years): 40 Referring Provider: Nicki Guadalajara MD, ABSM Height (inches): 71 Interpreting Physician: Nicki Guadalajara MD, ABSM Weight (lbs): 227 RPSGT: Armen Pickup BMI: 32 MRN: [id number] Neck Size: 17.00  CLINICAL INFORMATION The patient is referred for a BiPAP/ASV titration to treat sleep apnea.  Patient has been using BiPAP Auto SV with an old Respironics Dream Station BiPAP auto SV unit.  SLEEP STUDY TECHNIQUE As per the AASM Manual for the Scoring of Sleep and Associated Events v2.3 (April 2016) with a hypopnea requiring 4% desaturations.  The channels recorded and monitored were frontal, central and occipital EEG, electrooculogram (EOG), submentalis EMG (chin), nasal and oral airflow, thoracic and abdominal wall motion, anterior tibialis EMG, snore microphone, electrocardiogram, and pulse oximetry. Bilevel positive airway pressure (BPAP) was initiated at the beginning of the study and titrated to treat sleep-disordered breathing.  MEDICATIONS amiodarone (PACERONE) 200 MG tablet apixaban (ELIQUIS) 5 MG TABS tablet atorvastatin (LIPITOR) 10 MG tablet carvedilol (COREG) 12.5 MG tablet diltiazem (CARDIZEM CD) 240 MG 24 hr capsule empagliflozin (JARDIANCE) 10 MG TABS tablet fluticasone (FLONASE) 50 MCG/ACT nasal spray furosemide (LASIX) 40 MG tablet hydrALAZINE (APRESOLINE) 25 MG tablet omega-3 acid ethyl esters (LOVAZA) 1 g capsule Medications self-administered by patient taken the night of the study : N/A  RESPIRATORY PARAMETERS Optimal IPAP Pressure (cm): 22 AHI at Optimal Pressure (/hr) 0 Optimal EPAP Pressure (cm): 18   Overall Minimal O2 (%): 82.0 Minimal O2 at Optimal Pressure (%): 86.0  SLEEP ARCHITECTURE Start Time: 10:27:34 PM Stop Time: 4:27:57 AM Total Time (min): 360.4 Total Sleep Time (min): 219.5 Sleep Latency (min): 9.4 Sleep Efficiency (%): 60.9% REM Latency  (min): 176.0 WASO (min): 131.5 Stage N1 (%): 25.5% Stage N2 (%): 55.8% Stage N3 (%): 0.0% Stage R (%): 18.7 Supine (%): 92.48 Arousal Index (/hr): 62.6   CARDIAC DATA The 2 lead EKG demonstrated sinus rhythm. The mean heart rate was 52.4 beats per minute. Other EKG findings include: None.  LEG MOVEMENT DATA The total Periodic Limb Movements of Sleep (PLMS) were 0. The PLMS index was 0.0. A PLMS index of <15 is considered normal in adults.  IMPRESSIONS - BiPAP was initiated at 10/6 and was titrated to an optimal PAP pressure at 22/18 cm of water. (AHI 0, RDI 0; O2 nadir 86%). He was not transitioned to ASV. - Mild Central Sleep Apnea was noted during this titration (CAI 7.1/h). Central events were present at 10/6 to 13/9 cm, and were not present at higher BiPAP pressures.  - Moderate oxygen desaturations were observed during this titration to a nadir of 82% at 15/11 cm. - No snoring was audible during this study. - No cardiac abnormalities were observed during this study. - Clinically significant periodic limb movements were not noted during this study. Arousals associated with PLMs were rare.  DIAGNOSIS - Obstructive Sleep Apnea (G47.33)  RECOMMENDATIONS - Recommend an initial trial of BiPAP Auto therapy with EPAP min of 18, PS of 4 and IPAP max of 25 cm H2O with heated humidification.  A Large size Resmed Full Face Mirage Quattro mask was used for the titration. If central events develop he will need a BiPAP Auto SV device.  - Effort should be made to optimize nasal and oropharyngeal patency. - Avoid alcohol, sedatives and other CNS depressants that may worsen sleep apnea and disrupt normal sleep architecture. - Sleep hygiene should be reviewed to assess factors that may improve sleep quality. -  Weight management and regular exercise should be initiated or continued. - Recommend a download and turn to slep clinic evaluation after 4-6 weeks of therapy.  [Electronically signed] 10/13/2022  10:00 AM  Nicki Guadalajara MD, [facility], ABSM Diplomate, American Board of Sleep Medicine  NPI: [id number]  [facility] PH: [phone]   FX: [phone] ACCREDITED BY THE AMERICAN ACADEMY OF SLEEP MEDICINE

## 2022-10-18 DIAGNOSIS — H40023 Open angle with borderline findings, high risk, bilateral: Secondary | ICD-10-CM | POA: Diagnosis not present

## 2022-10-26 ENCOUNTER — Ambulatory Visit (INDEPENDENT_AMBULATORY_CARE_PROVIDER_SITE_OTHER): Payer: Medicare PPO | Admitting: Podiatry

## 2022-10-26 ENCOUNTER — Encounter: Payer: Self-pay | Admitting: Podiatry

## 2022-10-26 DIAGNOSIS — M79674 Pain in right toe(s): Secondary | ICD-10-CM | POA: Diagnosis not present

## 2022-10-26 DIAGNOSIS — E119 Type 2 diabetes mellitus without complications: Secondary | ICD-10-CM

## 2022-10-26 DIAGNOSIS — B351 Tinea unguium: Secondary | ICD-10-CM

## 2022-10-26 DIAGNOSIS — M79675 Pain in left toe(s): Secondary | ICD-10-CM

## 2022-10-26 NOTE — Progress Notes (Signed)
This patient returns to my office for at risk foot care.  This patient requires this care by a professional since this patient will be at risk due to having diabetes and renal insufficiency and thrombocytopenia..  Patient is taking eliquis.  This patient is unable to cut nails himself since the patient cannot reach his nails.These nails are painful walking and wearing shoes.  This patient presents for at risk foot care today.  General Appearance  Alert, conversant and in no acute stress.  Vascular  Dorsalis pedis and posterior tibial  pulses are palpable  bilaterally.  Capillary return is within normal limits  bilaterally. Temperature is within normal limits  bilaterally.  Neurologic  Senn-Weinstein monofilament wire test within normal limits  bilaterally. Muscle power within normal limits bilaterally.  Nails Thick disfigured discolored nails with subungual debris  from hallux to fifth toes bilaterally. No evidence of bacterial infection or drainage bilaterally.  Orthopedic  No limitations of motion  feet .  No crepitus or effusions noted.  No bony pathology or digital deformities noted.  HAV  B/L.  Skin  normotropic skin with no porokeratosis noted bilaterally.  No signs of infections or ulcers noted.     Onychomycosis  Pain in right toes  Pain in left toes  Consent was obtained for treatment procedures.   Mechanical debridement of nails 1-5  bilaterally performed with a nail nipper.  Filed with dremel without incident.    Return office visit   3 months                   Told patient to return for periodic foot care and evaluation due to potential at risk complications.   Kamani Lewter DPM  

## 2022-10-28 ENCOUNTER — Telehealth: Payer: Self-pay | Admitting: *Deleted

## 2022-10-28 DIAGNOSIS — I10 Essential (primary) hypertension: Secondary | ICD-10-CM

## 2022-10-28 DIAGNOSIS — I272 Pulmonary hypertension, unspecified: Secondary | ICD-10-CM

## 2022-10-28 DIAGNOSIS — G4733 Obstructive sleep apnea (adult) (pediatric): Secondary | ICD-10-CM

## 2022-10-28 NOTE — Telephone Encounter (Signed)
The patient has been notified of the result and verbalized understanding.  All questions (if any) were answered. Martin Cordova, CMA 10/28/2022 2:37 PM    Upon patient request DME selection is [facility]. Patient understands he will be contacted by [facility] to set up his cpap. Patient understands to call if [facility] does not contact him with new setup in a timely manner. Patient understands they will be called once confirmation has been received from [facility]/ that they have received their new machine to schedule 10 week follow up appointment.   [facility] notified of new cpap order  Please add to airview Patient was grateful for the call and thanked me.

## 2022-10-28 NOTE — Telephone Encounter (Signed)
-----   Message from Lennette Bihari, MD sent at 10/13/2022 10:06 AM EDT ----- [place] [place], please notify pt and set up with his DME for a new BiPAP device. There never did an ASV titration, but will need to follow and if central events develop future transition to ASV may necessary

## 2022-11-03 ENCOUNTER — Ambulatory Visit: Payer: Medicare PPO | Admitting: Podiatry

## 2022-11-11 DIAGNOSIS — E1122 Type 2 diabetes mellitus with diabetic chronic kidney disease: Secondary | ICD-10-CM | POA: Diagnosis not present

## 2022-11-11 DIAGNOSIS — E669 Obesity, unspecified: Secondary | ICD-10-CM | POA: Diagnosis not present

## 2022-11-11 DIAGNOSIS — Q613 Polycystic kidney, unspecified: Secondary | ICD-10-CM | POA: Diagnosis not present

## 2022-11-11 DIAGNOSIS — E119 Type 2 diabetes mellitus without complications: Secondary | ICD-10-CM | POA: Diagnosis not present

## 2022-11-11 DIAGNOSIS — N1832 Chronic kidney disease, stage 3b: Secondary | ICD-10-CM | POA: Diagnosis not present

## 2022-11-16 ENCOUNTER — Telehealth: Payer: Self-pay | Admitting: Cardiovascular Disease

## 2022-11-16 NOTE — Telephone Encounter (Signed)
Patient stated he had sleep study done in April and is following-up on getting his information sent over to [facility].

## 2022-11-17 ENCOUNTER — Encounter: Payer: Self-pay | Admitting: Cardiovascular Disease

## 2022-11-29 ENCOUNTER — Other Ambulatory Visit: Payer: Self-pay | Admitting: Cardiovascular Disease

## 2022-12-01 ENCOUNTER — Ambulatory Visit (INDEPENDENT_AMBULATORY_CARE_PROVIDER_SITE_OTHER): Payer: Medicare PPO

## 2022-12-01 VITALS — Ht 71.0 in | Wt 227.0 lb

## 2022-12-01 DIAGNOSIS — Z0000 Encounter for general adult medical examination without abnormal findings: Secondary | ICD-10-CM | POA: Diagnosis not present

## 2022-12-01 NOTE — Progress Notes (Signed)
Subjective:   Martin Cordova is a 82 y.o. male who presents for Medicare Annual/Subsequent preventive examination.  Visit Complete: Virtual  I connected with  Martin Cordova on 12/01/22 by a audio enabled telemedicine application and verified that I am speaking with the correct person using two identifiers.  Patient Location: Home  Provider Location: Home Office  I discussed the limitations of evaluation and management by telemedicine. The patient expressed understanding and agreed to proceed..  Review of Systems     Cardiac Risk Factors include: advanced age (>81men, >65 women);diabetes mellitus;dyslipidemia;male gender;hypertension     Objective:    Today's Vitals   12/01/22 1136  Weight: 227 lb (103 kg)  Height: 5\' 11"  (1.803 m)   Body mass index is 31.66 kg/m.     12/01/2022   12:17 PM 09/27/2022   10:49 PM 02/09/2021    8:16 AM 12/22/2020    8:29 AM 09/05/2020    7:16 PM 10/17/2017    1:15 PM 06/20/2017    9:01 AM  Advanced Directives  Does Patient Have a Medical Advance Directive? No Yes Yes Yes Yes Yes Yes  Type of Furniture conservator/restorer;Living will Healthcare Power of [place];Living will Healthcare Power of [place];Living will Healthcare Power of [employer] of [place];Living will Healthcare Power of Attorney  Does patient want to make changes to medical advance directive?  No - Patient declined No - Patient declined No - Patient declined  No - Patient declined   Copy of Healthcare Power of Attorney in Chart?  Yes - validated most recent copy scanned in chart (See row information) No - copy requested Yes - validated most recent copy scanned in chart (See row information)  No - copy requested   Would patient like information on creating a medical advance directive? Yes (MAU/Ambulatory/Procedural Areas - Information given)     No - Patient declined     Current Medications (verified) Outpatient Encounter Medications as of  12/01/2022  Medication Sig   amiodarone (PACERONE) 200 MG tablet TAKE 1 & 1/2 TABLETS (300 MG) DAILY FOR 3 WEEKS, THEN DECREASE TO 1 TABLET (200 MG) DAILY.   apixaban (ELIQUIS) 5 MG TABS tablet TAKE (1) TABLET BY MOUTH TWICE DAILY.   atorvastatin (LIPITOR) 10 MG tablet Take 1 tablet (10 mg total) by mouth daily with breakfast.   carvedilol (COREG) 12.5 MG tablet TAKE 1 TABLET IN THE MORNING AND 1/2 TABLET IN THE EVENING   diltiazem (CARDIZEM CD) 240 MG 24 hr capsule TAKE 1 CAPSULE BY MOUTH ONCE DAILY AT BEDTIME.   empagliflozin (JARDIANCE) 10 MG TABS tablet Take 10 mg by mouth daily.   fluticasone (FLONASE) 50 MCG/ACT nasal spray Place 2 sprays into both nostrils daily.   furosemide (LASIX) 40 MG tablet Alternate between taking 40mg  (1 tablet) a day and 20mg  (1/2 tablet) a day.   hydrALAZINE (APRESOLINE) 25 MG tablet Take 1.5 tablets (37.5 mg total) by mouth 2 (two) times daily.   omega-3 acid ethyl esters (LOVAZA) 1 g capsule TAKE 2 CAPSULES BY MOUTH ONCE DAILY.   OZEMPIC, 0.25 OR 0.5 MG/DOSE, 2 MG/3ML SOPN 0.25 mg for first four weeks then 0.5 mg Subcutaneous Once a week for 90 days   No facility-administered encounter medications on file as of 12/01/2022.    Allergies (verified) Lisinopril, Dulaglutide, and Ibuprofen   History: Past Medical History:  Diagnosis Date   Allergy    Arthritis    Cataract    Clotting disorder (HCC)  Colon polyps    Diabetes mellitus without complication (HCC)    Diverticulitis    Diverticulosis    GERD 12/07/2006   HYPERGLYCEMIA 12/07/2006   HYPERLIPIDEMIA 12/07/2006   mixed wth an atherogenic dyslipidemic pattern   HYPERTENSION 12/07/2006   Nephrolithiasis    Nonalcoholic fatty liver disease    Normal coronary arteries 04/06/2007   by cath EF 55%., last ECHO 10/13/10 EF>55% mild MR, last nuc 06/16/10 EF 57% low risk scan   OBESITY 11/10/2009   OSA treated with BiPAP 10/22/2007   PAF (paroxysmal atrial fibrillation) (HCC) 04/09/2013    Palpitations    tachy   Pulmonary HTN (HCC)    RV syst.  40-33mmHg- moderate by echo   Renal insufficiency    Cr to 3 on ACE   Sleep apnea    SOB (shortness of breath)    with exertion   THROMBOCYTOPENIA 12/07/2006   TRANSAMINASES, SERUM, ELEVATED 12/07/2006   VENTRICULAR TACHYCARDIA 03/27/2007   monitored by dr. Tresa Endo   Past Surgical History:  Procedure Laterality Date   A-FLUTTER ABLATION N/A 04/19/2017   Procedure: A-FLUTTER ABLATION;  Surgeon: Duke Salvia, MD;  Location: [facility] INVASIVE CV LAB;  Service: Cardiovascular;  Laterality: N/A;   CARDIAC CATHETERIZATION  04/06/2007   normal cors   CARDIOVERSION N/A 08/05/2013   Procedure: CARDIOVERSION;  Surgeon: Lennette Bihari, MD;  Location: [facility] ENDOSCOPY;  Service: Cardiovascular;  Laterality: N/A;   CARDIOVERSION N/A 12/26/2016   Procedure: CARDIOVERSION;  Surgeon: Lennette Bihari, MD;  Location: [facility] ENDOSCOPY;  Service: Cardiovascular;  Laterality: N/A;   CHOLECYSTECTOMY     CYSTOSCOPY/RETROGRADE/URETEROSCOPY  08/19/2011   Procedure: CYSTOSCOPY/RETROGRADE/URETEROSCOPY;  Surgeon: Lindaann Slough, MD;  Location: [facility];  Service: Urology;  Laterality: Right;  JJ STENT PLACEMENT   EYE SURGERY     ROTATOR CUFF REPAIR Right    Family History  Problem Relation Age of Onset   Diabetes Mother    Heart failure Mother    Stroke Mother    Kidney failure Father    Diabetes Father    Cancer Paternal Uncle    Colon cancer Neg Hx    Esophageal cancer Neg Hx    Stomach cancer Neg Hx    Rectal cancer Neg Hx    Social History   Socioeconomic History   Marital status: Married    Spouse name: Not on file   Number of children: 4   Years of education: Not on file   Highest education level: Not on file  Occupational History   Occupation: retired Runner, broadcasting/film/video  Tobacco Use   Smoking status: Former    Packs/day: 0.25    Years: 10.00    Additional pack years: 0.00    Total pack years: 2.50    Types: Cigarettes    Quit  date: 08/04/1980    Years since quitting: 42.3   Smokeless tobacco: Former    Types: Chew    Quit date: 08/08/2004  Vaping Use   Vaping Use: Never used  Substance and Sexual Activity   Alcohol use: Not Currently   Drug use: No   Sexual activity: Yes  Other Topics Concern   Not on file  Social History Narrative   Not on file   Social Determinants of Health   Financial Resource Strain: Low Risk  (12/01/2022)   Overall Financial Resource Strain (CARDIA)    Difficulty of Paying Living Expenses: Not hard at all  Food Insecurity: No Food Insecurity (12/01/2022)   Hunger Vital Sign  Worried About Programme researcher, broadcasting/film/video in the Last Year: Never true    Ran Out of Food in the Last Year: Never true  Transportation Needs: No Transportation Needs (12/01/2022)   PRAPARE - Administrator, Civil Service (Medical): No    Lack of Transportation (Non-Medical): No  Physical Activity: Sufficiently Active (12/01/2022)   Exercise Vital Sign    Days of Exercise per Week: 5 days    Minutes of Exercise per Session: 30 min  Stress: No Stress Concern Present (12/01/2022)   [employer] of Occupational Health - Occupational Stress Questionnaire    Feeling of Stress : Not at all  Social Connections: Socially Integrated (12/01/2022)   Social Connection and Isolation Panel [NHANES]    Frequency of Communication with Friends and Family: More than three times a week    Frequency of Social Gatherings with Friends and Family: Three times a week    Attends Religious Services: More than 4 times per year    Active Member of Clubs or Organizations: Yes    Attends Banker Meetings: 1 to 4 times per year    Marital Status: Married    Tobacco Counseling Counseling given: Not Answered   Clinical Intake:  Pre-visit preparation completed: Yes  Pain : No/denies pain     Diabetes: Yes CBG done?: No Did pt. bring in CBG monitor from home?: No  How often do you need to have someone help  you when you read instructions, pamphlets, or other written materials from your doctor or pharmacy?: 1 - Never  Interpreter Needed?: No  Information entered by :: Kandis Fantasia LPN   Activities of Daily Living    12/01/2022   12:16 PM  In your present state of health, do you have any difficulty performing the following activities:  Hearing? 0  Vision? 0  Difficulty concentrating or making decisions? 0  Walking or climbing stairs? 0  Dressing or bathing? 0  Doing errands, shopping? 0  Preparing Food and eating ? N  Using the Toilet? N  In the past six months, have you accidently leaked urine? N  Do you have problems with loss of bowel control? N  Managing your Medications? N  Managing your Finances? N  Housekeeping or managing your Housekeeping? N    Patient Care Team: Donita Brooks, MD as PCP - General (Family Medicine) Lennette Bihari, MD as PCP - Cardiology (Cardiology) Helane Gunther, DPM as Consulting Physician (Podiatry)  Indicate any recent Medical Services you may have received from other than [facility] providers in the past year (date may be approximate).     Assessment:   This is a routine wellness examination for Martin Cordova.  Hearing/Vision screen Hearing Screening - Comments:: Denies hearing difficulties   Vision Screening - Comments:: Wears rx glasses - up to date with routine eye exams with [facility]     Dietary issues and exercise activities discussed:     Goals Addressed             This Visit's Progress    Remain active and independent        Depression Screen    12/01/2022   12:16 PM 12/23/2021    8:34 AM 12/22/2020    8:35 AM 12/20/2019    8:36 AM 12/18/2018   10:22 AM 10/27/2017    9:16 AM 10/27/2017    8:23 AM  PHQ 2/9 Scores  PHQ - 2 Score 0 0 0 0 0 0 0  PHQ-  9 Score    0       Fall Risk    12/01/2022   12:17 PM 12/22/2020    8:35 AM 12/20/2019    8:36 AM 12/18/2018   10:22 AM 10/27/2017    9:16 AM  Fall Risk   Falls  in the past year? 0 0 0 0 No  Number falls in past yr: 0 0 0    Injury with Fall? 0 0 0    Risk for fall due to : No Fall Risks No Fall Risks     Follow up Falls prevention discussed;Education provided;Falls evaluation completed Falls evaluation completed  Falls evaluation completed     MEDICARE RISK AT HOME:  Medicare Risk at Home - 12/01/22 1218     Any stairs in or around the home? Yes    If so, are there any without handrails? No    Home free of loose throw rugs in walkways, pet beds, electrical cords, etc? Yes    Adequate lighting in your home to reduce risk of falls? Yes    Life alert? No    Use of a cane, walker or w/c? No    Grab bars in the bathroom? Yes    Shower chair or bench in shower? No    Elevated toilet seat or a handicapped toilet? Yes             TIMED UP AND GO:  Was the test performed?  No    Cognitive Function:        12/01/2022   12:18 PM  6CIT Screen  What Year? 0 points  What month? 0 points  What time? 0 points  Count back from 20 0 points  Months in reverse 0 points  Repeat phrase 0 points  Total Score 0 points    Immunizations Immunization History  Administered Date(s) Administered   Fluad Quad(high Dose 65+) 03/03/2022   Influenza Split 03/06/2012, 04/10/2017, 02/21/2019, 04/06/2021   Influenza Whole 03/19/2009, 03/06/2010   Influenza, High Dose Seasonal PF 02/08/2016, 04/10/2017, 02/15/2018, 02/21/2019, 02/20/2020   Influenza,inj,Quad PF,6+ Mos 02/23/2013   Influenza-Unspecified 04/06/2014, 03/28/2015, 04/10/2017, 03/12/2021   PFIZER Comirnaty(Gray Top)Covid-19 Tri-Sucrose Vaccine 09/29/2020   PFIZER(Purple Top)SARS-COV-2 Vaccination 06/26/2019, 07/17/2019, 03/11/2020, 04/06/2020   Pfizer Covid-19 Vaccine Bivalent Booster 50yrs & up 04/12/2021, 05/06/2021   Pneumococcal Conjugate-13 06/20/2014   Pneumococcal Polysaccharide-23 06/16/2008   Respiratory Syncytial Virus Vaccine,Recomb Aduvanted(Arexvy) 02/16/2022   Td 01/25/1999,  07/06/2009   Tdap 06/20/2014   Zoster Recombinat (Shingrix) 12/09/2017, 02/15/2018   Zoster, Live 02/09/2013    TDAP status: Up to date  Pneumococcal vaccine status: Up to date  Covid-19 vaccine status: Information provided on how to obtain vaccines.   Qualifies for Shingles Vaccine? Yes   Zostavax completed Yes   Shingrix Completed?: Yes  Screening Tests Health Maintenance  Topic Date Due   Diabetic kidney evaluation - Urine ACR  04/12/2014   HEMOGLOBIN A1C  06/24/2021   COVID-19 Vaccine (7 - 2023-24 season) 02/04/2022   FOOT EXAM  10/14/2022   Diabetic kidney evaluation - eGFR measurement  10/23/2022   INFLUENZA VACCINE  01/05/2023   OPHTHALMOLOGY EXAM  04/05/2023   Medicare Annual Wellness (AWV)  12/01/2023   Colonoscopy  02/10/2024   DTaP/Tdap/Td (4 - Td or Tdap) 06/20/2024   Pneumonia Vaccine 46+ Years old  Completed   Zoster Vaccines- Shingrix  Completed   HPV VACCINES  Aged Out   Hepatitis C Screening  Discontinued    Health Maintenance  Health  Maintenance Due  Topic Date Due   Diabetic kidney evaluation - Urine ACR  04/12/2014   HEMOGLOBIN A1C  06/24/2021   COVID-19 Vaccine (7 - 2023-24 season) 02/04/2022   FOOT EXAM  10/14/2022   Diabetic kidney evaluation - eGFR measurement  10/23/2022    Colorectal cancer screening: No longer required.   Lung Cancer Screening: (Low Dose CT Chest recommended if Age 77-80 years, 20 pack-year currently smoking OR have quit w/in 15years.) does not qualify.   Lung Cancer Screening Referral: n/a  Additional Screening:  Hepatitis C Screening: does not qualify  Vision Screening: Recommended annual ophthalmology exams for early detection of glaucoma and other disorders of the eye. Is the patient up to date with their annual eye exam?  Yes  Who is the provider or what is the name of the office in which the patient attends annual eye exams? [facility] If pt is not established with a provider, would they like to  be referred to a provider to establish care? No .   Dental Screening: Recommended annual dental exams for proper oral hygiene  Diabetic Foot Exam: Diabetic Foot Exam: Overdue, Pt has been advised about the importance in completing this exam. Pt is scheduled for diabetic foot exam on at next office visit.  Community Resource Referral / Chronic Care Management: CRR required this visit?  No   CCM required this visit?  No     Plan:     I have personally reviewed and noted the following in the patient's chart:   Medical and social history Use of alcohol, tobacco or illicit drugs  Current medications and supplements including opioid prescriptions. Patient is not currently taking opioid prescriptions. Functional ability and status Nutritional status Physical activity Advanced directives List of other physicians Hospitalizations, surgeries, and ER visits in previous 12 months Vitals Screenings to include cognitive, depression, and falls Referrals and appointments  In addition, I have reviewed and discussed with patient certain preventive protocols, quality metrics, and best practice recommendations. A written personalized care plan for preventive services as well as general preventive health recommendations were provided to patient.     Kandis Fantasia [place], [place]   07/20/863   After Visit Summary: (MyChart) Due to this being a telephonic visit, the after visit summary with patients personalized plan was offered to patient via MyChart   Nurse Notes: No concerns

## 2022-12-01 NOTE — Patient Instructions (Signed)
Martin Cordova , Thank you for taking time to come for your Medicare Wellness Visit. I appreciate your ongoing commitment to your health goals. Please review the following plan we discussed and let me know if I can assist you in the future.   These are the goals we discussed:  Goals      Remain active and independent        This is a list of the screening recommended for you and due dates:  Health Maintenance  Topic Date Due   Yearly kidney health urinalysis for diabetes  04/12/2014   Hemoglobin A1C  06/24/2021   COVID-19 Vaccine (7 - 2023-24 season) 02/04/2022   Complete foot exam   10/14/2022   Yearly kidney function blood test for diabetes  10/23/2022   Flu Shot  01/05/2023   Eye exam for diabetics  04/05/2023   Medicare Annual Wellness Visit  12/01/2023   Colon Cancer Screening  02/10/2024   DTaP/Tdap/Td vaccine (4 - Td or Tdap) 06/20/2024   Pneumonia Vaccine  Completed   Zoster (Shingles) Vaccine  Completed   HPV Vaccine  Aged Out   Hepatitis C Screening  Discontinued    Advanced directives: Information on Advanced Care Planning can be found at [facility] of [facility] Advance Health Care Directives Advance Health Care Directives ([email or web address] Please bring a copy of your health care power of attorney and living will to the office to be added to your chart at your convenience.  Conditions/risks identified: Aim for 30 minutes of exercise or brisk walking, 6-8 glasses of water, and 5 servings of fruits and vegetables each day.   Next appointment: Follow up in one year for your annual wellness visit.   Preventive Care 42 Years and Older, Male  Preventive care refers to lifestyle choices and visits with your health care provider that can promote health and wellness. What does preventive care include? A yearly physical exam. This is also called an annual well check. Dental exams once or twice a year. Routine eye exams. Ask your health care provider how often you should  have your eyes checked. Personal lifestyle choices, including: Daily care of your teeth and gums. Regular physical activity. Eating a healthy diet. Avoiding tobacco and drug use. Limiting alcohol use. Practicing safe sex. Taking low doses of aspirin every day. Taking vitamin and mineral supplements as recommended by your health care provider. What happens during an annual well check? The services and screenings done by your health care provider during your annual well check will depend on your age, overall health, lifestyle risk factors, and family history of disease. Counseling  Your health care provider may ask you questions about your: Alcohol use. Tobacco use. Drug use. Emotional well-being. Home and relationship well-being. Sexual activity. Eating habits. History of falls. Memory and ability to understand (cognition). Work and work Astronomer. Screening  You may have the following tests or measurements: Height, weight, and BMI. Blood pressure. Lipid and cholesterol levels. These may be checked every 5 years, or more frequently if you are over 17 years old. Skin check. Lung cancer screening. You may have this screening every year starting at age 53 if you have a 30-pack-year history of smoking and currently smoke or have quit within the past 15 years. Fecal occult blood test (FOBT) of the stool. You may have this test every year starting at age 67. Flexible sigmoidoscopy or colonoscopy. You may have a sigmoidoscopy every 5 years or a colonoscopy every 10 years starting at age  50. Prostate cancer screening. Recommendations will vary depending on your family history and other risks. Hepatitis C blood test. Hepatitis B blood test. Sexually transmitted disease (STD) testing. Diabetes screening. This is done by checking your blood sugar (glucose) after you have not eaten for a while (fasting). You may have this done every 1-3 years. Abdominal aortic aneurysm (AAA) screening. You  may need this if you are a current or former smoker. Osteoporosis. You may be screened starting at age 95 if you are at high risk. Talk with your health care provider about your test results, treatment options, and if necessary, the need for more tests. Vaccines  Your health care provider may recommend certain vaccines, such as: Influenza vaccine. This is recommended every year. Tetanus, diphtheria, and acellular pertussis (Tdap, Td) vaccine. You may need a Td booster every 10 years. Zoster vaccine. You may need this after age 24. Pneumococcal 13-valent conjugate (PCV13) vaccine. One dose is recommended after age 62. Pneumococcal polysaccharide (PPSV23) vaccine. One dose is recommended after age 7. Talk to your health care provider about which screenings and vaccines you need and how often you need them. This information is not intended to replace advice given to you by your health care provider. Make sure you discuss any questions you have with your health care provider. Document Released: 06/19/2015 Document Revised: 02/10/2016 Document Reviewed: 03/24/2015 Elsevier Interactive Patient Education  2017 [employer].  Fall Prevention in the Home Falls can cause injuries. They can happen to people of all ages. There are many things you can do to make your home safe and to help prevent falls. What can I do on the outside of my home? Regularly fix the edges of walkways and driveways and fix any cracks. Remove anything that might make you trip as you walk through a door, such as a raised step or threshold. Trim any bushes or trees on the path to your home. Use bright outdoor lighting. Clear any walking paths of anything that might make someone trip, such as rocks or tools. Regularly check to see if handrails are loose or broken. Make sure that both sides of any steps have handrails. Any raised decks and porches should have guardrails on the edges. Have any leaves, snow, or ice cleared  regularly. Use sand or salt on walking paths during winter. Clean up any spills in your garage right away. This includes oil or grease spills. What can I do in the bathroom? Use night lights. Install grab bars by the toilet and in the tub and shower. Do not use towel bars as grab bars. Use non-skid mats or decals in the tub or shower. If you need to sit down in the shower, use a plastic, non-slip stool. Keep the floor dry. Clean up any water that spills on the floor as soon as it happens. Remove soap buildup in the tub or shower regularly. Attach bath mats securely with double-sided non-slip rug tape. Do not have throw rugs and other things on the floor that can make you trip. What can I do in the bedroom? Use night lights. Make sure that you have a light by your bed that is easy to reach. Do not use any sheets or blankets that are too big for your bed. They should not hang down onto the floor. Have a firm chair that has side arms. You can use this for support while you get dressed. Do not have throw rugs and other things on the floor that can make you  trip. What can I do in the kitchen? Clean up any spills right away. Avoid walking on wet floors. Keep items that you use a lot in easy-to-reach places. If you need to reach something above you, use a strong step stool that has a grab bar. Keep electrical cords out of the way. Do not use floor polish or wax that makes floors slippery. If you must use wax, use non-skid floor wax. Do not have throw rugs and other things on the floor that can make you trip. What can I do with my stairs? Do not leave any items on the stairs. Make sure that there are handrails on both sides of the stairs and use them. Fix handrails that are broken or loose. Make sure that handrails are as long as the stairways. Check any carpeting to make sure that it is firmly attached to the stairs. Fix any carpet that is loose or worn. Avoid having throw rugs at the top or  bottom of the stairs. If you do have throw rugs, attach them to the floor with carpet tape. Make sure that you have a light switch at the top of the stairs and the bottom of the stairs. If you do not have them, ask someone to add them for you. What else can I do to help prevent falls? Wear shoes that: Do not have high heels. Have rubber bottoms. Are comfortable and fit you well. Are closed at the toe. Do not wear sandals. If you use a stepladder: Make sure that it is fully opened. Do not climb a closed stepladder. Make sure that both sides of the stepladder are locked into place. Ask someone to hold it for you, if possible. Clearly mark and make sure that you can see: Any grab bars or handrails. First and last steps. Where the edge of each step is. Use tools that help you move around (mobility aids) if they are needed. These include: Canes. Walkers. Scooters. Crutches. Turn on the lights when you go into a dark area. Replace any light bulbs as soon as they burn out. Set up your furniture so you have a clear path. Avoid moving your furniture around. If any of your floors are uneven, fix them. If there are any pets around you, be aware of where they are. Review your medicines with your doctor. Some medicines can make you feel dizzy. This can increase your chance of falling. Ask your doctor what other things that you can do to help prevent falls. This information is not intended to replace advice given to you by your health care provider. Make sure you discuss any questions you have with your health care provider. Document Released: 03/19/2009 Document Revised: 10/29/2015 Document Reviewed: 06/27/2014 Elsevier Interactive Patient Education  2017 [employer].

## 2022-12-20 DIAGNOSIS — G4733 Obstructive sleep apnea (adult) (pediatric): Secondary | ICD-10-CM | POA: Diagnosis not present

## 2022-12-26 ENCOUNTER — Ambulatory Visit (INDEPENDENT_AMBULATORY_CARE_PROVIDER_SITE_OTHER): Payer: Medicare PPO | Admitting: Family Medicine

## 2022-12-26 ENCOUNTER — Encounter: Payer: Self-pay | Admitting: Family Medicine

## 2022-12-26 VITALS — BP 126/72 | HR 55 | Temp 97.8°F | Ht 71.0 in | Wt 224.0 lb

## 2022-12-26 DIAGNOSIS — Z0001 Encounter for general adult medical examination with abnormal findings: Secondary | ICD-10-CM | POA: Diagnosis not present

## 2022-12-26 DIAGNOSIS — E785 Hyperlipidemia, unspecified: Secondary | ICD-10-CM | POA: Diagnosis not present

## 2022-12-26 DIAGNOSIS — N184 Chronic kidney disease, stage 4 (severe): Secondary | ICD-10-CM

## 2022-12-26 DIAGNOSIS — Z125 Encounter for screening for malignant neoplasm of prostate: Secondary | ICD-10-CM | POA: Diagnosis not present

## 2022-12-26 DIAGNOSIS — E119 Type 2 diabetes mellitus without complications: Secondary | ICD-10-CM

## 2022-12-26 DIAGNOSIS — Z0000 Encounter for general adult medical examination without abnormal findings: Secondary | ICD-10-CM

## 2022-12-26 DIAGNOSIS — R5383 Other fatigue: Secondary | ICD-10-CM

## 2022-12-26 DIAGNOSIS — I480 Paroxysmal atrial fibrillation: Secondary | ICD-10-CM

## 2022-12-26 LAB — CBC WITH DIFFERENTIAL/PLATELET
Absolute Monocytes: 515 cells/uL (ref 200–950)
Basophils Relative: 0.5 %
Eosinophils Absolute: 123 cells/uL (ref 15–500)
Eosinophils Relative: 2.2 %
Lymphs Abs: 974 cells/uL (ref 850–3900)
MPV: 12.1 fL (ref 7.5–12.5)
Monocytes Relative: 9.2 %
Neutro Abs: 3959 cells/uL (ref 1500–7800)
Neutrophils Relative %: 70.7 %
Platelets: 123 10*3/uL — ABNORMAL LOW (ref 140–400)
RDW: 12.8 % (ref 11.0–15.0)
Total Lymphocyte: 17.4 %
WBC: 5.6 10*3/uL (ref 3.8–10.8)

## 2022-12-26 NOTE — Progress Notes (Signed)
Subjective:    Patient ID: Martin Cordova, male    DOB: [birth year], 82 y.o.   MRN: [id number]  HPI  Patient is a very pleasant 82 year old Caucasian male who is here today for complete physical exam.  Past medical history is significant for atrial fibrillation for which he sees cardiology.  He is currently on amiodarone for rhythm control as well as carvedilol and diltiazem for rate control.   He also has type 2 diabetes mellitus, hld, CKD stage 4. He is currently on jardiance and ozempic for diabetes.  He sees endocrinology and nephrology.  Last colonoscopy was 2022 and is up to date.  He denies any falls or depression or memory loss.  Patient reports severe fatigue at times.  He states that his heart rate will get into the 40s.  He denies any syncope or near syncope or chest pain or shortness of breath. Immunization History  Administered Date(s) Administered  . Fluad Quad(high Dose 65+) 03/03/2022  . Influenza Split 03/06/2012, 04/10/2017, 02/21/2019, 04/06/2021  . Influenza Whole 03/19/2009, 03/06/2010  . Influenza, High Dose Seasonal PF 02/08/2016, 04/10/2017, 02/15/2018, 02/21/2019, 02/20/2020  . Influenza,inj,Quad PF,6+ Mos 02/23/2013  . Influenza-Unspecified 04/06/2014, 03/28/2015, 04/10/2017, 03/12/2021  . PFIZER Comirnaty(Gray Top)Covid-19 Tri-Sucrose Vaccine 09/29/2020  . PFIZER(Purple Top)SARS-COV-2 Vaccination 06/26/2019, 07/17/2019, 03/11/2020, 04/06/2020  . Research officer, trade union 59yrs & up 04/12/2021, 05/06/2021  . Pneumococcal Conjugate-13 06/20/2014  . Pneumococcal Polysaccharide-23 06/16/2008  . Respiratory Syncytial Virus Vaccine,Recomb Aduvanted(Arexvy) 02/16/2022  . Td 01/25/1999, 07/06/2009  . Tdap 06/20/2014  . Zoster Recombinant(Shingrix) 12/09/2017, 02/15/2018  . Zoster, Live 02/09/2013   No visits with results within 1 Month(s) from this visit.  Latest known visit with results is:  Abstract on 06/24/2022  Component Date Value Ref Range  Status  . HM Diabetic Eye Exam 04/04/2022 No Retinopathy  No Retinopathy Final    Past Medical History:  Diagnosis Date  . Allergy   . Arthritis   . Cataract   . Clotting disorder (HCC)   . Colon polyps   . Diabetes mellitus without complication (HCC)   . Diverticulitis   . Diverticulosis   . GERD 12/07/2006  . HYPERGLYCEMIA 12/07/2006  . HYPERLIPIDEMIA 12/07/2006   mixed wth an atherogenic dyslipidemic pattern  . HYPERTENSION 12/07/2006  . Nephrolithiasis   . Nonalcoholic fatty liver disease   . Normal coronary arteries 04/06/2007   by cath EF 55%., last ECHO 10/13/10 EF>55% mild MR, last nuc 06/16/10 EF 57% low risk scan  . OBESITY 11/10/2009  . OSA treated with BiPAP 10/22/2007  . PAF (paroxysmal atrial fibrillation) (HCC) 04/09/2013  . Palpitations    tachy  . Pulmonary HTN (HCC)    RV syst.  40-73mmHg- moderate by echo  . Renal insufficiency    Cr to 3 on ACE  . Sleep apnea   . SOB (shortness of breath)    with exertion  . THROMBOCYTOPENIA 12/07/2006  . TRANSAMINASES, SERUM, ELEVATED 12/07/2006  . 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 Tresa Endo   Past Surgical History:  Procedure Laterality Date  . A-FLUTTER ABLATION N/A 04/19/2017   Procedure: A-FLUTTER ABLATION;  Surgeon: Duke Salvia, MD;  Location: [facility] INVASIVE CV LAB;  Service: Cardiovascular;  Laterality: N/A;  . CARDIAC CATHETERIZATION  04/06/2007   normal cors  . CARDIOVERSION N/A 08/05/2013   Procedure: CARDIOVERSION;  Surgeon: Lennette Bihari, MD;  Location[facility] ENDOSCOPY;  Service: Cardiovascular;  Laterality: N/A;  . CARDIOVERSION N/A 12/26/2016   Procedure: CARDIOVERSION;  Surgeon: Lennette Bihari, MD;  Location: [facility] ENDOSCOPY;  Service: Cardiovascular;  Laterality: N/A;  . CHOLECYSTECTOMY    . CYSTOSCOPY/RETROGRADE/URETEROSCOPY  08/19/2011   Procedure: CYSTOSCOPY/RETROGRADE/URETEROSCOPY;  Surgeon: Lindaann Slough, MD;  [facility];  Service: Urology;  Laterality:  Right;  JJ STENT PLACEMENT  . EYE SURGERY    . ROTATOR CUFF REPAIR Right    Current Outpatient Medications on File Prior to Visit  Medication Sig Dispense Refill  . amiodarone (PACERONE) 200 MG tablet TAKE 1 & 1/2 TABLETS (300 MG) DAILY FOR 3 WEEKS, THEN DECREASE TO 1 TABLET (200 MG) DAILY. 180 tablet 2  . apixaban (ELIQUIS) 5 MG TABS tablet TAKE (1) TABLET BY MOUTH TWICE DAILY. 180 tablet 1  . atorvastatin (LIPITOR) 10 MG tablet Take 1 tablet (10 mg total) by mouth daily with breakfast. 90 tablet 3  . carvedilol (COREG) 12.5 MG tablet TAKE 1 TABLET IN THE MORNING AND 1/2 TABLET IN THE EVENING 135 tablet 0  . diltiazem (CARDIZEM CD) 240 MG 24 hr capsule TAKE 1 CAPSULE BY MOUTH ONCE DAILY AT BEDTIME. 90 capsule 0  . empagliflozin (JARDIANCE) 10 MG TABS tablet Take 10 mg by mouth daily.    . fluticasone (FLONASE) 50 MCG/ACT nasal spray Place 2 sprays into both nostrils daily. 16 g 6  . furosemide (LASIX) 40 MG tablet Alternate between taking 40mg  (1 tablet) a day and 20mg  (1/2 tablet) a day. 90 tablet 3  . hydrALAZINE (APRESOLINE) 25 MG tablet Take 1.5 tablets (37.5 mg total) by mouth 2 (two) times daily. 270 tablet 3  . omega-3 acid ethyl esters (LOVAZA) 1 g capsule TAKE 2 CAPSULES BY MOUTH ONCE DAILY. 180 capsule 3  . OZEMPIC, 0.25 OR 0.5 MG/DOSE, 2 MG/3ML SOPN 0.25 mg for first four weeks then 0.5 mg Subcutaneous Once a week for 90 days     No current facility-administered medications on file prior to visit.   Allergies  Allergen Reactions  . Lisinopril Swelling and Other (See Comments)    Renal insufficiency also  . Dulaglutide Other (See Comments)  . Ibuprofen Other (See Comments)    Per the patient, he has "kidney and liver issues" and is NOT suppose to take this   Social History   Socioeconomic History  . Marital status: Married    Spouse name: Not on file  . Number of children: 4  . Years of education: Not on file  . Highest education level: Not on file  Occupational History   . Occupation: retired Runner, broadcasting/film/video  Tobacco Use  . Smoking status: Former    Current packs/day: 0.00    Average packs/day: 0.3 packs/day for 10.0 years (2.5 ttl pk-yrs)    Types: Cigarettes    Start date: 08/05/1970    Quit date: 08/04/1980    Years since quitting: 42.4  . Smokeless tobacco: Former    Types: Chew    Quit date: 08/08/2004  Vaping Use  . Vaping status: Never Used  Substance and Sexual Activity  . Alcohol use: Not Currently  . Drug use: No  . Sexual activity: Yes  Other Topics Concern  . Not on file  Social History Narrative  . Not on file   Social Determinants of Health   Financial Resource Strain: Low Risk  (12/01/2022)   Overall Financial Resource Strain (CARDIA)   . Difficulty of Paying Living Expenses: Not hard at all  Food Insecurity: No Food Insecurity (12/01/2022)   Hunger Vital Sign   . Worried About Running  Out of Food in the Last Year: Never true   . Ran Out of Food in the Last Year: Never true  Transportation Needs: No Transportation Needs (12/01/2022)   PRAPARE - Transportation   . Lack of Transportation (Medical): No   . Lack of Transportation (Non-Medical): No  Physical Activity: Sufficiently Active (12/01/2022)   Exercise Vital Sign   . Days of Exercise per Week: 5 days   . Minutes of Exercise per Session: 30 min  Stress: No Stress Concern Present (12/01/2022)   [employer] of Occupational Health - Occupational Stress Questionnaire   . Feeling of Stress : Not at all  Social Connections: Socially Integrated (12/01/2022)   Social Connection and Isolation Panel [NHANES]   . Frequency of Communication with Friends and Family: More than three times a week   . Frequency of Social Gatherings with Friends and Family: Three times a week   . Attends Religious Services: More than 4 times per year   . Active Member of Clubs or Organizations: Yes   . Attends Banker Meetings: 1 to 4 times per year   . Marital Status: Married  Catering manager  Violence: Not At Risk (12/01/2022)   Humiliation, Afraid, Rape, and Kick questionnaire   . Fear of Current or Ex-Partner: No   . Emotionally Abused: No   . Physically Abused: No   . Sexually Abused: No   Family History  Problem Relation Age of Onset  . Diabetes Mother   . Heart failure Mother   . Stroke Mother   . Kidney failure Father   . Diabetes Father   . Cancer Paternal Uncle   . Colon cancer Neg Hx   . Esophageal cancer Neg Hx   . Stomach cancer Neg Hx   . Rectal cancer Neg Hx      Review of Systems negative    Objective:   Physical Exam Vitals reviewed.  Constitutional:      General: He is not in acute distress.    Appearance: He is not diaphoretic.  HENT:     Head: Normocephalic and atraumatic.     Right Ear: External ear normal.     Left Ear: External ear normal.     Nose: Nose normal.     Mouth/Throat:     Pharynx: No oropharyngeal exudate.  Eyes:     General: No scleral icterus.       Right eye: No discharge.        Left eye: No discharge.     Conjunctiva/sclera: Conjunctivae normal.     Pupils: Pupils are equal, round, and reactive to light.  Cardiovascular:     Rate and Rhythm: Normal rate and regular rhythm.     Heart sounds: Normal heart sounds. No murmur heard.    No friction rub. No gallop.  Pulmonary:     Effort: Pulmonary effort is normal. No respiratory distress.     Breath sounds: Normal breath sounds. No stridor. No wheezing or rales.  Chest:     Chest wall: No tenderness.  Abdominal:     General: Bowel sounds are normal. There is no distension.     Palpations: Abdomen is soft. There is no mass.     Tenderness: There is no abdominal tenderness. There is no guarding or rebound.  Musculoskeletal:        General: No tenderness. Normal range of motion.     Cervical back: Normal range of motion and neck supple.  Lymphadenopathy:  Cervical: No cervical adenopathy.  Skin:    General: Skin is warm.     Coloration: Skin is not pale.      Findings: No erythema or rash.  Neurological:     Mental Status: He is alert and oriented to person, place, and time.     Cranial Nerves: No cranial nerve deficit.     Coordination: Coordination normal.  Psychiatric:        Behavior: Behavior normal.        Thought Content: Thought content normal.        Judgment: Judgment normal.          Assessment & Plan:  Encounter for Medicare annual wellness exam  Stage 4 chronic kidney disease (HCC) - Plan: CBC with Differential/Platelet, COMPLETE METABOLIC PANEL WITH GFR, Lipid panel, PSA, Hemoglobin A1c  Controlled type 2 diabetes mellitus without complication, without long-term current use of insulin (HCC) - Plan: CBC with Differential/Platelet, COMPLETE METABOLIC PANEL WITH GFR, Lipid panel, PSA, Hemoglobin A1c  PAF (paroxysmal atrial fibrillation) (HCC) - Plan: CBC with Differential/Platelet, COMPLETE METABOLIC PANEL WITH GFR, Lipid panel, PSA, Hemoglobin A1c  Hyperlipidemia LDL goal <70 - Plan: CBC with Differential/Platelet, COMPLETE METABOLIC PANEL WITH GFR, Lipid panel, PSA, Hemoglobin A1c  Fatigue, unspecified type - Plan: TSH Because of the fatigue, I will check a TSH.  However I suspect the fatigue may be due bradycardia.  I recommended discussed with his cardiologist possibly decreasing the dose of diltiazem or carvedilol at their discretion.  I would like him to uptitrate the Ozempic as tolerated to achieve additional weight loss.  Right now he is feeling early satiety and 0.5 mg weekly so we will not make any changes.  I will check a CBC a CMP a lipid panel a PSA and an A1c.  I like to see his LDL cholesterol less than 161 and his A1c less than 6.5.  His nephrologist is monitoring his chronic kidney disease.  Diabetic foot exam was performed today and was normal.

## 2022-12-27 LAB — COMPLETE METABOLIC PANEL WITH GFR
AG Ratio: 2 (calc) (ref 1.0–2.5)
ALT: 27 U/L (ref 9–46)
AST: 18 U/L (ref 10–35)
Albumin: 4.4 g/dL (ref 3.6–5.1)
Alkaline phosphatase (APISO): 42 U/L (ref 35–144)
BUN/Creatinine Ratio: 12 (calc) (ref 6–22)
BUN: 34 mg/dL — ABNORMAL HIGH (ref 7–25)
CO2: 24 mmol/L (ref 20–32)
Calcium: 11 mg/dL — ABNORMAL HIGH (ref 8.6–10.3)
Chloride: 109 mmol/L (ref 98–110)
Creat: 2.89 mg/dL — ABNORMAL HIGH (ref 0.70–1.22)
Globulin: 2.2 g/dL (calc) (ref 1.9–3.7)
Glucose, Bld: 102 mg/dL — ABNORMAL HIGH (ref 65–99)
Potassium: 4.7 mmol/L (ref 3.5–5.3)
Sodium: 142 mmol/L (ref 135–146)
Total Bilirubin: 0.7 mg/dL (ref 0.2–1.2)
Total Protein: 6.6 g/dL (ref 6.1–8.1)
eGFR: 21 mL/min/{1.73_m2} — ABNORMAL LOW (ref >=60)

## 2022-12-27 LAB — LIPID PANEL
Cholesterol: 113 mg/dL (ref <200)
HDL: 37 mg/dL — ABNORMAL LOW (ref >=40)
LDL Cholesterol (Calc): 50 mg/dL (calc)
Non-HDL Cholesterol (Calc): 76 mg/dL (calc) (ref <130)
Total CHOL/HDL Ratio: 3.1 (calc) (ref <5.0)
Triglycerides: 180 mg/dL — ABNORMAL HIGH (ref <150)

## 2022-12-27 LAB — TSH: TSH: 2.66 mIU/L (ref 0.40–4.50)

## 2022-12-27 LAB — HEMOGLOBIN A1C
Hgb A1c MFr Bld: 5.8 % of total Hgb — ABNORMAL HIGH (ref <5.7)
Mean Plasma Glucose: 120 mg/dL
eAG (mmol/L): 6.6 mmol/L

## 2022-12-27 LAB — CBC WITH DIFFERENTIAL/PLATELET
Basophils Absolute: 28 cells/uL (ref 0–200)
HCT: 45.3 % (ref 38.5–50.0)
Hemoglobin: 14.9 g/dL (ref 13.2–17.1)
MCH: 31.4 pg (ref 27.0–33.0)
MCHC: 32.9 g/dL (ref 32.0–36.0)
MCV: 95.4 fL (ref 80.0–100.0)
RBC: 4.75 10*6/uL (ref 4.20–5.80)

## 2022-12-27 LAB — PSA: PSA: 1.18 ng/mL (ref <=4.00)

## 2022-12-28 ENCOUNTER — Encounter: Payer: Self-pay | Admitting: Cardiovascular Disease

## 2022-12-28 ENCOUNTER — Other Ambulatory Visit: Payer: Self-pay

## 2022-12-28 ENCOUNTER — Ambulatory Visit: Payer: Medicare PPO | Attending: Cardiovascular Disease | Admitting: Cardiovascular Disease

## 2022-12-28 ENCOUNTER — Other Ambulatory Visit: Payer: Medicare PPO

## 2022-12-28 VITALS — BP 125/70 | HR 51 | Ht 71.0 in | Wt 223.1 lb

## 2022-12-28 DIAGNOSIS — R5383 Other fatigue: Secondary | ICD-10-CM

## 2022-12-28 DIAGNOSIS — I10 Essential (primary) hypertension: Secondary | ICD-10-CM

## 2022-12-28 DIAGNOSIS — G4731 Primary central sleep apnea: Secondary | ICD-10-CM | POA: Diagnosis not present

## 2022-12-28 DIAGNOSIS — E669 Obesity, unspecified: Secondary | ICD-10-CM | POA: Diagnosis not present

## 2022-12-28 DIAGNOSIS — G4739 Other sleep apnea: Secondary | ICD-10-CM

## 2022-12-28 DIAGNOSIS — E1122 Type 2 diabetes mellitus with diabetic chronic kidney disease: Secondary | ICD-10-CM | POA: Diagnosis not present

## 2022-12-28 DIAGNOSIS — G4733 Obstructive sleep apnea (adult) (pediatric): Secondary | ICD-10-CM | POA: Diagnosis not present

## 2022-12-28 DIAGNOSIS — I480 Paroxysmal atrial fibrillation: Secondary | ICD-10-CM | POA: Diagnosis not present

## 2022-12-28 DIAGNOSIS — N1832 Chronic kidney disease, stage 3b: Secondary | ICD-10-CM | POA: Diagnosis not present

## 2022-12-28 DIAGNOSIS — Z7984 Long term (current) use of oral hypoglycemic drugs: Secondary | ICD-10-CM | POA: Diagnosis not present

## 2022-12-28 DIAGNOSIS — Z7901 Long term (current) use of anticoagulants: Secondary | ICD-10-CM | POA: Diagnosis not present

## 2022-12-28 MED ORDER — AMIODARONE HCL 200 MG PO TABS: 300.0000 mg | ORAL_TABLET | Freq: Every day | ORAL | 3 refills | Status: DC

## 2022-12-28 NOTE — Progress Notes (Signed)
Patient ID: Martin Cordova, male   DOB: 1940/08/23, 82 y.o.   MRN: [id number]       HPI: Martin Cordova is a 82 y.o. male is who presents to the office today for a 55month follow-up cardiology clinic evaluation.  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 In the past, he developed an obstructive uropathy attributed to kidney stones; creatinine had risen up to 3 and ultimately improved and stabilized at approximately 1.7. Martin Cordova uses his BiPAP daily and does note good sleep.  In the past he had issues with mild peripheral edema, which ultimately improved.  He had been previously taken off his lisinopril when his creatinine had risen in the setting of his obstructive uropathy.  In 2015 he developed recurrent episodes of palpitations and was found to have recurrent paroxysmal atrial fibrillation/flutter.  His medications were adjusted and  he was started on antiarrhythmic therapy with Rythmol to take in addition to his increasing doses of carvedilol.  He was started on Eliquis for anticoagulation.  He underwent successful cardioversion on 08/05/2013.  Since that time, he is unaware of any recurrent atrial fibrillation.  He has noticed more energy. He had been maintained on 50 mg twice a day of carvedilol in addition to Rythmol 225 mg every 8 hours, but due to slow pulse rates, these doses have ultimately been reduced.  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 He has been on CPAP therapy initially for approximately 8 years and has been on BiPAP auto SV for over 4 years. He admits to using his BiPAP with 100% compliance.  He obtained a new BiPAP machine in June.  He feels that his machine is  significantly improved from his prior one. When I last saw him I reviewed his  download of his new BiPAP auto SV machine indicates that 90% of the time is EPAP pressure was 17 and 90% of the time his pressure support was 5.8, giving an IPAP of 21.8 cm.  He is using it 100% of the time and is averaging 5 hours and 49 minutes on his most recent download from 12/07/2013 through 01/05/2014.  His average AHI was 7.5.  His device setting maximum BiPAP pressure is 25 cm in maximum EPAP pressure 21 cm.  His average central event index is 1.3, and average obstructive apneic index 0.2, with an average copy index of 6.0.  Average breath.  Rate is 18 breaths per minute with a minute ventilation of 12.1.  Average total body may 670 mL.  When I saw him on 10/21/15 he was unaware of any arrhythmia.  He was on carvedilol 12.5 mg twice a day, Rythmol 225 mg twice a day, hydralazine 25 mg twice a day and eliquis  5 mg twice a day.  He also has been taking omeprazole for GERD and atorvastatin 10 mg for hyperlipidemia. He was found to have recurrent atrial flutter with variable block.  At that time, I recommended that he increase his Rythmol and take 225 mg every 8 hours and further increased his carvedilol to 18.75 mg twice a day.  Laboratory had revealed a magnesium of 2.0.  He has stage III chronic kidney disease  and his BUN was 26, creatinine 1.71 with a GFR estimate of 39.  Potassium was 4.8.  TSH was normal at 2.4.  Insomnia.  One week later in follow-up he had reverted back to sinus rhythm.  When seen in September 2017, he was maintaining sinus rhythm.  Since then, he admits to weight gain.  In early April, he began to notice some increasing shortness of breath and elevated heart rate and fatigue.  He was seen by Azalee Course on 09/07/2016 and was found to be back in atrial flutter with a ventricular rate at 90.  He has been on eloquence 5 g twice a day for anticoagulation and was on carvedilol 18.75 twice a day.  Carvedilol dose  was increased to 25 mg twice a day.  He was also told at that time to try increasing his Propofenone to 300 mg 3 times a day.  He took this for several days but did not tolerate the increased dose and reduced it back to its previous dose of 225 mg every 8 hours.  When I saw him on 10/20/2016  I increased carvedilol to 37.5 mg twice a day.  He has continued to use his BiPAP therapy for sleep apnea.    When I saw him in June 2018 he was still in atrial flutter with 3:1 block.  I discontinued hydralazine and started him on Cardizem CD 180 mg, both for blood pressure and potential antiarrhythmic therapy. He also was having ankle edema and I started him on low-dose hydrochlorothiazide.  His peripheral edema significantly improved.  He has felt better.  He denied episodes of chest tightness.  He underwent successful outpatient DC cardioversion by me on 12/26/2016.  He successfully cardioverted at 120 joules.  When he returned for a follow-up evaluation on 01/12/2017 and saw  Azalee Course, [facility] .  He was back in atrial flutter.  At that time his propafenone was discontinued.  Over the past several months, Martin Cordova has felt well.  At times there is some trace swelling in his ankles.  He continues to use his BiPAP with 100% compliance.  He was hospitalized on September 19 with an episode of diverticulitis and was discharged on 02/24/2017.   When I saw him in October 2018, he was back in atrial flutter.  At that time, I recommended consideration for atrial flutter ablation.  He was evaluated by Dr. Graciela Husbands and on 04/19/2017 underwent successful atrial flutter ablation across the caval tricuspid isthmus, which interrupted bidirectional conduction.  Initial plan was for him to continue anticoagulation for 4 weeks, but unfortunately he developed irregular tachycardia palpitations about 48 hours after the procedure.  He's been found to have paroxysmal atrial fibrillation.  He as result is on chronic anticoagulation therapy.   In November 2018 an echo Doppler study showed an EF of 40-45%.  He has been on carvedilol 25 mg twice a day and takes an extra carvedilol as needed.  He has had recurrent short burst of atrial fibrillation, but had an episode for almost an entire day.  He received a new BiPAP machine after his previous machine completely malfunction.  He admits to 100% compliance.  [facility] [employer] is his DME company.  He has diabetes mellitus and was started back on Tradjenta for hemoglobin A1c of 7.    He has a [employer] Dream Station BiPAP auto SV unit. I obtained a download from September 02, 2017 through October 01, 2017.  He is compliant.  His average CPAP pressure  was 16.8 and average pressure support pressure was 4.1.  90% of the time device EPAP was 17 cm.  AHI, however, was still mildly increased at 8.9.  He is unaware of breakthrough snoring.  He denies restless legs.  He denies chest pain.  He has noticed some mild episodes of dizziness in the morning.    I recommended some changes to his BiPAP therapy and change his ramp time to 5 minutes, increased but pressure support to 5.5 increased his EPAP to 9 cm water pressure.  A download was obtained from June 12 through December 14, 2017.  He is 100% compliant and averaging his hours and 24 minutes of CPAP use per night.  His average AHI is still slightly elevated 8.2.  90% of time device CPAP was 17 cm water pressure.  Though his pressure support range can from 0 to 5.5 cm, he apparently has only been needing a pressure support of 2.4 cm of water.  He feels well.  He has a F&P medium cushion full facemask but he is an oral breather and when he opens his mouth the facemask is actually too small which may be contributing to some of his increased AHI.  He presents to sleep clinic for further evaluation. He had been evaluated by Dr. Darrick Penna for his renal issues.  He denies any awareness of recurrent atrial flutter.  He has seen Dr. Tanya Nones here he also has been diagnosed with  monoclonal gammopathy of undetermined significance and is monitored by Dr. Candise Che.  He had undergone a cardiac MRI which was ordered by Dr. Graciela Husbands last year in February 2019 which showed normal LV size and function with an EF of 54%.  There was no delayed gadolinium uptake on inversion recovery sequences.  He had a trileaflet aortic valve with mild AR, mild MR, moderate RV enlargement, moderate biatrial enlargement and there was moderate LVH with the mid and basal septum measuring 14 mm compared to the posterior wall at 9 mm.  I last saw him in February 2020 at which time he denied any recurrent chest pain or abnormal heart rhythm.  He was using his BiPAP auto SV with 100% compliance.  A download was obtained in the office from January 26 through July 30, 2018.  He is 100% compliant.  He requires high pressures and his average EPAP pressure was 17 which also was his 90% pressure.  He is set up to a potential maximum IPAP pressure of 30.  His pressure support ranges from 0-5.5 with an average pressure support of 2.4 cm.  He is now using a large mask which is much more comfortable and is he is doing better.  On his most recent download, his AHI was still mildly increased at 7.1/h.  He has undergone follow-up hematologic evaluation with Dr. Domenic Polite for his monoclonal gammopathy.    I saw him on April 05, 2019.  Since his prior evaluation he felt that he may have had 3 brief short-lived episodes of PAF which resolved spontaneously.  He denied any associated chest pain. He underwent Mohs surgery for a skin cancer on his chin.  He was unable to use BiPAP for several days since this is where his mask would rest.  I obtained a download from July 1 through April 04, 2019.  His minimum EPAP pressure was 17 with a maximum IPAP pressure of 30 and pressure support of 5.5 cm.  Apparently his ramp start pressure is 9.5 cm.  Average AHI is 6.9.  90% of the time his device EPAP pressure was 17.  Average breath rate was 16.1  breaths/min.   Echo Doppler study on January 25, 2019 showed an EF of 60 to 65% with mild LVH.  Normal diastolic parameters.  There was moderate aortic sclerosis without stenosis and mild AR.  I saw him on Oct 08, 2019 at which time he felt well.  He is followed by nephrology with stage IV chronic kidney disease with recent creatinine in February 2021 at 2.59.  He was unaware of any recurrent atrial fibrillation or flutter.  He continues to use BiPAP auto SV therapy with 100% compliance.  He is followed by Dr. Sharl Ma for his diabetes mellitus.   I saw him in November 2021 at which time he remained stable.  He is now followed by Dr. Allyne Gee with the imminent retirement of Dr. Allyne Gee.  Laboratory from April 03, 2020 did show improvement in his creatinine which typically range between 2 and 2.5 and was 1.99.  He denies any significant shortness of breath.  At times he has noticed.  Short brief episodes of possible PAF which self resolved.  His medical regimen has included carvedilol 25 mg twice a day, diltiazem 240 mg daily, furosemide 40 mg for blood pressure and heart rate control.  He is diabetic on Tradjenta.  He has been on Eliquis without bleeding for anticoagulation and continues to be on low-dose atorvastatin and lovaza for hyperlipidemia.  He has a [employer] BiPAP auto SV machine and I discussed with him the recall with a very small risk of Styrofoam breakdown contributed by possible ozone related cleaning cleansing agents.  He had contacted [employer] and is on a list for a new machine.    I saw him for follow-up evaluation on December 04, 2020 and last saw him 3 weeks later on December 21, 2020.  Since his July 1 evaluation he had noticed several months of increased fatigability as well as some mild leg swelling right greater than left.  He denied any chest pain.  He has continued to use his BiPAP ASV.  I obtained a download in the office from June 1 through December 03, 2020 which shows 100% use.  AHI was 5.8  and is 90% EPAP was 17 and 90% pressure support was 3.3 cm.  He denies any chest pain.  He is unaware of rapid palpitations.  During that evaluation, his ECG confirmed that he was in atrial fibrillation with ventricular rate at 79 with right bundle branch block and PVCs.  I suspected that his atrial fibrillation may have been of several months duration and he had been on diltiazem CD2 140 mg daily, carvedilol 25 mg twice a day, and furosemide 40 mg daily both for rate control, edema and blood pressure control.  With his recurrent atrial fibrillation I recommended institution of amiodarone and started him on 200 mg twice a day.  I discussed potential adverse consequences of amiodarone and the importance of monitoring LFTs and thyroid function.  I scheduled him for laboratory and recommended a follow-up echo Doppler study.  He states he is prescription filled and started his therapy on December 08, 2020.  After several days he seemed to notice slightly more energy.  He is unaware of palpitations.   When I saw him on December 21, 2020, his ECG confirmed that he had converted to sinus rhythm.  At that time, I recommended he continue to take amiodarone 200 mg twice a day with plans to eventually reduce  his dosing.  He underwent a follow-up echo Doppler study on December 23, 2020 which showed normal LV function with EF at 60 to 65%, mild concentric LVH, grade 2 diastolic dysfunction, and there was evidence for moderate left atrial dilatation and mild right atrial dilatation.  There was mild aortic sclerosis without stenosis.  He now sees Dr. Marisue Humble for his CKD.  At his March 17, 2021 follow-up evaluation he remained stable and denied any chest pain or shortness of breath, dizziness, presyncope or syncope. He is seeing Dr. Tanya Nones.  He is no longer on Tradjenta but is now on Farxiga for his diabetes mellitus.  He continues to be on Eliquis 5 mg twice a day, carvedilol 25 mg twice a day, diltiazem 240 mg daily as well as  amiodarone 200 mg twice a day.  He has been taking furosemide 40 alternating with 20 mg every other day.  He is on atorvastatin and Lovaza for mixed hyperlipidemia.  His ECG revealed sinus bradycardia and I recommended reduction of his amiodarone dose from 40 mg daily down to 300 mg for 3 weeks and further reduction to 200 mg daily.  He continued to be on carvedilol 25 mg twice a day and long-acting diltiazem 240 mg daily.  He sees Dr. Marisue Humble for his CKD and in July 2022 creatinine had increased to 2.24.  He continues to use BiPAP with 100% compliance.  I saw him on May 19, 2021 at which time he felt well.  Ten days previously  he tested positive for COVID and had mild URI symptoms without fever but did lose his sense of taste for several days.  He presently feels well.  He is unaware of any definitive breakthrough atrial fibrillation but he had noticed transient mild irregularity to heart rhythm last evening.  He continues to be on Eliquis 5 mg twice a day, amiodarone 20 mg daily, carvedilol 25 mg twice a day, diltiazem 240 mg, and has been taking furosemide 40 alternating with 20 mg every other day.  He had undergone recent laboratory on November 11 which showed creatinine at 2.2.  LDL cholesterol in July was 48.  During that evaluation, blood pressure was stable.  He was maintaining sinus rhythm.  I saw him on April 10, 2022 and he continued to do well. Marland Kitchen  He currently is on amiodarone 200 mg alternating with 300 mg every other day, carvedilol 12.5 mg twice a day, diltiazem 240 mg daily, Eliquis 5 mg twice a day, furosemide 20 mg alternating with 40 mg on alternating days, Jardiance 10 mg, and Lovaza 2 capsules twice a day in addition to atorvastatin 10 mg at bedtime.  He is unaware of recurrent atrial fibrillation.  He continues to see Dr. Sharl Ma for endocrine.  He admits to some fatigability.  He is being followed by Dr. Marisue Humble of nephrology with creatinine July 2023 at 2.68.  He continues to be on  BiPAP with 100% use.  During that evaluation, his blood pressure was elevated and he was bradycardic.  I recommended he reduce carvedilol to 12.5 mg in the morning and 6.2 5 at night and I initiated low-dose hydralazine to 12.5 mg twice a day.  I last saw him on September 01, 2022.  He was continuing to be followed by Dr. Marisue Humble of nephrology.  Recently his hydralazine dose was increased to 25 mg twice a day by Dr. Marisue Humble.  He is now on amiodarone 200 mg daily, carvedilol 12.5 mg in the morning and 6.25  mg in the evening, diltiazem CD2 140 mg daily, furosemide 40 mg alternating with 20 mg every other day, hydralazine 25 mg twice a day, Jardiance 10 mg daily.  He is on atorvastatin 10 mg and Lovaza 2 capsules daily.  He continues to use BiPAP auto SV and has a Respironics dream ST auto SV unit which is not Designer, industrial/product.  He admits to 100% compliance.  He states his current mask is too small.  He brought his machine with him to the office.  He admits to fatigue.  An Epworth Sleepiness Scale score was calculated today and this endorsed at 12 consistent with daytime sleepiness.  I was able to interrogate his machine; usage is 100%.  However average use per day is 4.6 hours.  AHI remains elevated at 25.7/h.  During that evaluation, he was in sinus rhythm at 54 bpm with first-degree AV block.  I provided him with a new ResMed AirTouch F20 large mask instead of his previous medium mask.  I recommended he undergo a BiPAP/ASV titration prior to him receiving a new machine.  Martin Cordova underwent an new BiPAP titration and apparently was titrated up to optimal pressure at 22 over 18 cm of water.  During that study he was not transition to ASV.  Central events were present at 10/6 but were not present at higher BiPAP pressures.  He subsequently received a new BiPAP air curve 11 via auto unit last week on December 20, 2022.  He can tell the difference that this does not have ASV.  A download was obtained for his 7 days use which  showed average use at 6 hours and 14 minutes.  He is requiring high pressure and his 95th percentile pressure is 22.9/18.9 in the setting is a minimum EPAP of 18 with maximum IPAP of 25.  AHI is elevated at 23.8 of which 12.6 are central events and 10.1 obstructive events.  He tells me he has been in atrial fibrillation most of the month.  His heart rate has been in the low 50s.  He has been taking amiodarone 200 mg alternating with 100 mg every other day, carvedilol 12.5 mg in the morning and 6.25 mg in the evening, diltiazem 240 mg daily, Eliquis 5 mg twice a day, hydralazine 37.5 mg twice a day, furosemide 40 mg, Jardiance 10 mg, atorvastatin 10 mg, Lovaza 2000 mg and is on weekly injections of Ozempic.  He presents for evaluation.  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 Tresa Endo   Diabetes mellitus without complication    Renal insufficiency     Cr to 3 on ACE   Nephrolithiasis    Normal coronary arteries 04/06/2007    by cath EF 55%., last ECHO 10/13/10 EF>55% mild MR, last nuc 06/16/10 EF 57% low risk scan   Pulmonary HTN     RV syst.  40-86mmHg- moderate by echo   Palpitations     tachy   PAF (paroxysmal atrial fibrillation) 04/09/2013    Past Surgical History  Procedure Laterality Date   Cholecystectomy     Rotator cuff repair     Cystoscopy/retrograde/ureteroscopy  08/19/2011    Procedure: CYSTOSCOPY/RETROGRADE/URETEROSCOPY;  Surgeon: Lindaann Slough, MD;  Location: [facility];  Service: Urology;  Laterality: Right;  JJ STENT PLACEMENT    Cardiac catheterization  04/06/2007    normal cors    Allergies  Allergen Reactions   Lisinopril      Renal insufficiency    Current Outpatient Medications:    amiodarone (PACERONE) 200 MG tablet, Take 1.5 tablets (300 mg total) by mouth daily., Disp: 90 tablet, Rfl: 3   apixaban (ELIQUIS) 5 MG TABS tablet, TAKE (1) TABLET BY MOUTH TWICE DAILY., Disp: 180 tablet, Rfl: 1   atorvastatin (LIPITOR) 10 MG tablet, Take 1 tablet (10 mg total) by mouth daily with breakfast., Disp: 90 tablet, Rfl: 3   carvedilol (COREG) 12.5 MG tablet, TAKE 1 TABLET IN THE MORNING AND 1/2 TABLET IN THE EVENING, Disp: 135 tablet, Rfl: 0   empagliflozin (JARDIANCE) 10 MG TABS tablet, Take 10 mg by mouth daily., Disp: , Rfl:    fluticasone (FLONASE) 50 MCG/ACT nasal spray, Place 2 sprays into both nostrils daily., Disp: 16 g, Rfl: 6   furosemide (LASIX) 40 MG tablet, Alternate between taking 40mg  (1 tablet) a day and 20mg  (1/2 tablet) a day., Disp: 90 tablet, Rfl: 3   hydrALAZINE (APRESOLINE) 25 MG tablet, Take 1.5 tablets (37.5 mg total) by mouth 2 (two) times daily., Disp: 270 tablet, Rfl: 3   omega-3 acid ethyl esters (LOVAZA) 1 g capsule, TAKE 2 CAPSULES BY MOUTH ONCE DAILY., Disp: 180 capsule, Rfl: 3   OZEMPIC, 0.25 OR 0.5 MG/DOSE, 2 MG/3ML SOPN, 0.25 mg for first four weeks then 0.5 mg Subcutaneous Once a week for 90 days, Disp: , Rfl:   Socially he is married has 4 children and 5 grandchildren. He is a distant relative to the "Moening brothers." He does try to walk and exercise. There is no tobacco use. He does drink occasional alcohol.   ROS General: Negative; No fevers, chills, or night sweats;  HEENT: Negative; No changes in vision or hearing, sinus congestion, difficulty swallowing Pulmonary: Negative; No cough, wheezing, shortness of breath, hemoptysis Cardiovascular: See history of present illness No recent peripheral edema GI: Status post recent episode of diverticulitis requiring 2 day hospitalization GU:  No dysuria, hematuria, or difficulty voiding; some difficulty with erectile  function Musculoskeletal: Negative; no myalgias, joint pain, or weakness Hematologic/Oncology: Positive M spike with monoclonal gammopathy of undetermined significance; history of thrombocytopenia Endocrine: Negative; no heat/cold intolerance; no diabetes Neuro: Negative; no changes in balance, headaches Skin: Negative; No rashes or skin lesions Psychiatric: Negative; No behavioral problems, depression Sleep: See HPI  No snoring, daytime sleepiness, hypersomnolence, bruxism, restless legs, hypnogognic hallucinations, no cataplexy Other comprehensive 14 point system review is negative.  PE BP 125/70 (BP Location: Right Arm, Patient Position: Sitting, Cuff Size: Normal)   Pulse (!) 51   Ht 5\' 11"  (1.803 m)   Wt 223 lb 1.3 oz (101.2 kg)   SpO2 93%   BMI 31.11 kg/m    Repeat blood pressure by me was 118/70.  Wt Readings from Last 3 Encounters:  12/28/22 223 lb 1.3 oz (101.2 kg)  12/26/22 224 lb (101.6 kg)  12/01/22 227 lb (103 kg)   General: Alert, oriented, no distress.  Skin: normal turgor, no rashes, warm and dry HEENT: Normocephalic, atraumatic. Pupils equal round and reactive to light; sclera anicteric; extraocular muscles intact;  Nose without nasal septal hypertrophy Mouth/Parynx benign; Mallinpatti scale 3/4 Neck: No JVD, no carotid bruits; normal carotid upstroke Lungs: clear to ausculatation and percussion; no  wheezing or rales Chest wall: without tenderness to palpitation Heart: PMI not displaced, irregularly irregular, bradycardic, s1 s2 normal, 1/6 systolic murmur, no diastolic murmur, no rubs, gallops, thrills, or heaves Abdomen: soft, nontender; no hepatosplenomehaly, BS+; abdominal aorta nontender and not dilated by palpation. Back: no CVA tenderness Pulses 2+ Musculoskeletal: full range of motion, normal strength, no joint deformities Extremities: no clubbing cyanosis or edema, Homan's sign negative  Neurologic: grossly nonfocal; Cranial nerves grossly  wnl Psychologic: Normal mood and affect     EKG Interpretation Date/Time:  Wednesday December 28 2022 09:42:47 EDT Ventricular Rate:  51 PR Interval:    QRS Duration:  186 QT Interval:  482 QTC Calculation: 444 R Axis:   -28  Text Interpretation: Atrial fibrillation with slow ventricular response Right bundle branch block When compared with ECG of 19-Apr-2017 13:22, Atrial fibrillation has replaced Sinus rhythm QRS duration has increased T wave inversion less evident in Anterior leads Confirmed by Nicki [place] ([id number]) on 12/28/2022 10:33:29 AM     September 01, 2022 ECG (independently read by me): Sinus bradycardia at 54, 1st degree block, PR 226 msec, RBBB, LAHB   April 11, 2022 ECG (independently read by me): Sinus bradycardia at 48, RBBB, 1st degree block, PR 246 msec, T wave abnormality  May 19, 2021 ECG (independently read by me): Sinus bradycardiia at 53, RBBB, LAHB  March 17, 2021 ECG (independently read by me): SInus bradycardia at 45, 1st degree AV block, PR 216 msec; RBBB  December 21, 2020 ECG (independently read by me): Sinus bradycardia at 55, 1st degree AV block; PR 228 msec; RBBBB   December 04, 2020 ECG (independently read by me): Atrial fibrillation at 79, RBBB, PVC  May 04, 2020 ECG (independently read by me): Sinus bradycardia at 56, PVC, RBBB  May 4, 2021ECG (independently read by me): Normal sinus rhythm at 63 bpm with premature complex.  Right bundle branch block with repolarization changes.  Normal intervals.  April 05, 2019 ECG (independently read by me): Sinus Bradycardia ay 55: RBBB with repolarization.  February 25,2020 ECG (independently read by me): Sinus bradycardia at 56 bpm.  Right bundle branch block with repolarization changes.  October 03, 2017 ECG (independently read by me):  Sinus bradycardia at 50 bpm.  Right bundle branch block with repolarization changes. QTc interval 461 ms.  January 2019 ECG (independently read by me): Sinus bradycardia 56  bpm.  Possible left atrial enlargement.  Right bundle branch block with repolarization changes.  PR interval 186 ms; QTc interval 436 ms  October 2018 ECG (independently read by me): Atrial flutter with variable block at 96 bpm.  Right bundle-branch block with repolarization changes.  QTc interval 437 ms  July 2018 ECG (independently read by me): Probable atrial flutter with a ventricular rate at 82 bpm.  Right bundle-branch block, left anterior hemiblock.  11/08/2016 ECG (independently read by me): Probable atrial flutter 3:1 block , ventricular rate at 85;  right bundle branch block with repolarization changes.  QTc interval 528 ms.   May 2018 ECG (independently read by me): atrial flutter with variable block with ventricular rate at 89 bpm.  Right bundle-branch block, left anterior hemiblock.  September 2017 ECG (independently read by me): Normal sinus rhythm at 63 bpm.  First-degree AV block with a PR interval of 208 ms.  Right bundle branch block with repolarization changes.  QTc interval 466 ms.  10/28/15 ECG (independently read by me): Sinus bradycardia at 54 bpm.  First degree block with PR interval  222 ms.  Right bundle-branch block.  10/21/2015 ECG (independently read by me): Atrial flutter with variable block.  Right bundle-branch block with repolarization changes.  October 2016 ECG (independently read by me): sinus rhythm with first-degree AV block with a PR interval at 216 ms.  Ventricular rate 86 bpm with occasional PVCs.    May 2016 ECG (independently read by me): Normal sinus rhythm at 60 bpm.  Right bundle-branch block with repolarization changes.  November 2015 ECG (independently read by me): Normal sinus rhythm at 61.  Nonspecific T abnormality.  QTc interval 394 ms.  11/18/2013 ECG (independently read by me): Sinus bradycardia 58 beats per minute.  PR interval 198 ms; QTc interval 371 ms.  Nonspecific ST changes.  07/29/2013 ECG  (independently read by me): Atrial flutter  with 2:1 block with a ventricular rate of 87 beats per minute. Atrial rate is approximately 360 ms. 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 fib flutter now with right bundle branch block and repolarization changes.  Prior ECG of 06/04/2013: EKG  suggests probable atrial flutter with a ventricular rate of 105 beats per minute. There also are frequent PVCs. QTc interval is 436 ms. They're nonspecific T changes.  LABS:    Latest Ref Rng & Units 12/26/2022    8:53 AM 10/22/2021    9:53 AM 04/16/2021    9:17 AM  BMP  Glucose 65 - 99 mg/dL 188  416  606   BUN 7 - 25 mg/dL 34  26  28   Creatinine 0.70 - 1.22 mg/dL 3.01  6.01  0.93   BUN/Creat Ratio 6 - 22 (calc) 12  10  13    Sodium 135 - 146 mmol/L 142  142  143   Potassium 3.5 - 5.3 mmol/L 4.7  5.1  4.9   Chloride 98 - 110 mmol/L 109  107  106   CO2 20 - 32 mmol/L 24  24  25    Calcium 8.6 - 10.3 mg/dL 23.5  57.3  9.8       Latest Ref Rng & Units 12/26/2022    8:53 AM 10/22/2021    9:53 AM 04/16/2021    9:17 AM  Hepatic Function  Total Protein 6.1 - 8.1 g/dL 6.6  6.6  7.0   Albumin 3.7 - 4.7 g/dL  4.3  4.3   AST 10 - 35 U/L 18  22  28    ALT 9 - 46 U/L 27  37  48   Alk Phosphatase 44 - 121 IU/L  60  61   Total Bilirubin 0.2 - 1.2 mg/dL 0.7  0.6  0.5       Latest Ref Rng & Units 12/26/2022    8:53 AM 10/22/2021    9:53 AM 04/16/2021    9:17 AM  CBC  WBC 3.8 - 10.8 Thousand/uL 5.6  5.1  5.5   Hemoglobin 13.2 - 17.1 g/dL 22.0  25.4  27.0   Hematocrit 38.5 - 50.0 % 45.3  44.6  43.0   Platelets 140 - 400 Thousand/uL 123  99  125    Lab Results  Component Value Date   TSH 2.66 12/26/2022   Lipid Panel     Component Value Date/Time   CHOL 113 12/26/2022 0853   TRIG 180 (H) 12/26/2022 0853   HDL 37 (L) 12/26/2022 0853   CHOLHDL 3.1 12/26/2022 0853   VLDL 37 (H) 10/25/2016 0853   LDLCALC 50 12/26/2022 6237  LDLDIRECT 65.0 04/12/2013 0825     RADIOLOGY: No  results found.    Patient Name: Martin Cordova, Martin Cordova Date: 09/27/2022 Gender: Male D.O.B: Aug 01, 1940 Age (years): 83 Referring Provider: Nicki [place] MD, ABSM Height (inches): 71 Interpreting Physician: Nicki [place] MD, ABSM Weight (lbs): 227 RPSGT: Armen Pickup BMI: 32 MRN: [id number] Neck Size: 17.00   CLINICAL INFORMATION The patient is referred for a BiPAP/ASV titration to treat sleep apnea.   Patient has been using BiPAP Auto SV with an old Respironics Dream Station BiPAP auto SV unit.   SLEEP STUDY TECHNIQUE As per the AASM Manual for the Scoring of Sleep and Associated Events v2.3 (April 2016) with a hypopnea requiring 4% desaturations.   The channels recorded and monitored were frontal, central and occipital EEG, electrooculogram (EOG), submentalis EMG (chin), nasal and oral airflow, thoracic and abdominal wall motion, anterior tibialis EMG, snore microphone, electrocardiogram, and pulse oximetry. Bilevel positive airway pressure (BPAP) was initiated at the beginning of the study and titrated to treat sleep-disordered breathing.   MEDICATIONS amiodarone (PACERONE) 200 MG tablet apixaban (ELIQUIS) 5 MG TABS tablet atorvastatin (LIPITOR) 10 MG tablet carvedilol (COREG) 12.5 MG tablet diltiazem (CARDIZEM CD) 240 MG 24 hr capsule empagliflozin (JARDIANCE) 10 MG TABS tablet fluticasone (FLONASE) 50 MCG/ACT nasal spray furosemide (LASIX) 40 MG tablet hydrALAZINE (APRESOLINE) 25 MG tablet omega-3 acid ethyl esters (LOVAZA) 1 g capsule Medications self-administered by patient taken the night of the study : N/A   RESPIRATORY PARAMETERS Optimal IPAP Pressure (cm):22AHI at Optimal Pressure (/hr)0 Optimal EPAP Pressure (cm):            18           Overall Minimal O2 (%):82.0Minimal O2 at Optimal Pressure (%):86.0   SLEEP ARCHITECTURE Start Time:10:27:34 PMStop Time:4:27:57 AMTotal Time (min):360.4Total Sleep Time (min):219.5 Sleep Latency (min):9.4Sleep Efficiency  (%):60.9%REM Latency (min):176.0WASO (min):131.5 Stage N1 (%):25.5%Stage N2 (%):55.8%Stage N3 (%):0.0%Stage R (%):18.7 Supine (%):     92.48   Arousal Index (/hr):     62.6        CARDIAC DATA The 2 lead EKG demonstrated sinus rhythm. The mean heart rate was 52.4 beats per minute. Other EKG findings include: None.   LEG MOVEMENT DATA The total Periodic Limb Movements of Sleep (PLMS) were 0. The PLMS index was 0.0. A PLMS index of <15 is considered normal in adults.   IMPRESSIONS - BiPAP was initiated at 10/6 and was titrated to an optimal PAP pressure at 22/18 cm of water. (AHI 0, RDI 0; O2 nadir 86%). He was not transitioned to ASV. - Mild Central Sleep Apnea was noted during this titration (CAI 7.1/h). Central events were present at 10/6 to 13/9 cm, and were not present at higher BiPAP pressures.  - Moderate oxygen desaturations were observed during this titration to a nadir of 82% at 15/11 cm. - No snoring was audible during this study. - No cardiac abnormalities were observed during this study. - Clinically significant periodic limb movements were not noted during this study. Arousals associated with PLMs were rare.   DIAGNOSIS - Obstructive Sleep Apnea (G47.33)   RECOMMENDATIONS - Recommend an initial trial of BiPAP Auto therapy with EPAP min of 18, PS of 4 and IPAP max of 25 cm H2O with heated humidification.  A Large size Resmed Full Face Mirage Quattro mask was used for the titration. If central events develop he will need a BiPAP Auto SV device.  - Effort should be made to optimize nasal and oropharyngeal patency. -  Avoid alcohol, sedatives and other CNS depressants that may worsen sleep apnea and disrupt normal sleep architecture. - Sleep hygiene should be reviewed to assess factors that may improve sleep quality. - Weight management and regular exercise should be initiated or continued. - Recommend a download and turn to slep clinic evaluation after 4-6 weeks of  therapy  IMPRESSION:  1. OSA treated with BiPAP   2. Complex sleep apnea syndrome   3. Paroxysmal atrial fibrillation (HCC)   4. Essential hypertension   5. Chronic kidney disease, stage 3b (HCC)   6. Anticoagulation adequate   7. Mild obesity   8. Type 2 diabetes mellitus with chronic kidney disease, without long-term current use of insulin, unspecified CKD stage [facility])     ASSESSMENT AND PLAN: Martin Cordova is an 82 year-old Caucasian male who has documented normal coronary arteries by cardiac catheterization in 2008. He has a history of hypertension, mixed hyperlipidemia, and has paroxysmal atrial fibrillation/flutter. He has a history of diabetes mellitus and after he had lost a significant amount of weight and with renal insufficiency he was taken off his diabetic medication.  He had been started back on Tradjenta for his diabetes mellitus but since his most recent evaluation this has been discontinued and he was on Farxiga, and now most recently on Jardiance 10 mg daily. He underwent successful catheter ablation for recurrent atrial flutter in November 2018 and remains on long-term anticoagulation with Eliquis.  Dr Graciela Husbands had ordered an MRI of his heart in February 2019 which did not reveal any delayed gadolinium uptake arguing against scar, infiltration or hypertrophic cardiomyopathy.  There was no SAM.  Since his evaluation with me in November 2021, he had noticed more fatigability.  At his office visit on December 04, 2020 ECG confirmed that he was in atrial fibrillation which I suspect was of several months duration.  At the time he already was on carvedilol 25 mg twice a day, Cardizem CD 240 mg daily, and furosemide 40 mg daily.  I initiated amiodarone 200 mg twice a day which he started on December 08, 2020.   When I saw him several weeks later on December 21, 2020 he felt improved and ECG confirmed that he had not converted to sinus rhythm.  At his evaluation in October 2022 he was maintaining sinus  rhythm but was bradycardic with heart rate at 45.  At that time, his dose of amiodarone was reduced.  In November 2023, he continued to be bradycardic and his carvedilol dose was reduced.  Blood pressure was elevated and he was initiated on low-dose hydralazine.  Subsequently, his dose has been increased slightly by Dr. Allyne Gee who follows his renal function.  Creatinine was 2.68 in July 2023.  At his last evaluation in March 2024 blood pressure remained elevated and hydralazine was further titrated to 37.5 mg twice a day.  He had been using an old Respironics dream ST auto SV unit for his complex sleep apnea.  His device was no longer downloadable and he continued to have significant events.  I extensively reviewed his most recent sleep evaluation where I had recommended he undergo BiPAP and possible ASV titration.  Apparently during that study he was titrated up to 22/18 with excellent response and as result ASV was never initiated.  Presently, he admits to being in atrial fibrillation off-and-on for the last 3 to 4 weeks.  His download of his new ResMed air curve 11 via auto unit shows moderate residual sleep apnea with both  obstructive and central events with total AHI 23.8 and central AHI at 12.6.  He has only been on his new device for 7 days.  Presently I will adjust his pressure support to 5.  He believes his current mask is significantly improved from his prior mask.  He continues to be in atrial fibrillation and heart rate today is 51.  Presently I will discontinue diltiazem and I have recommended further titration of amiodarone to 300 mg daily from his current dose of 200 alternating with 100 mg every other day.  He has seen Dr. Graciela Husbands in the past and I have recommended follow-up EP evaluation over the next month.  He just received his new CPAP device.  I will need to see him within 90 days for reassessment following his initiation date of December 20, 2022.  He will be undergoing primary care evaluation with  Dr. Tanya Nones and laboratory will be drawn.  He continues to to be on Eliquis for anticoagulation.  He will monitor his blood pressure and heart rate.  I will see him in 2 months for reevaluation.   Nicki [place], MD, [facility], ABSM Diplomate, American Board of Sleep Medicine  01/01/2023  2:23 PM

## 2022-12-28 NOTE — Patient Instructions (Signed)
Medication Instructions:  STOP DILTIAZEM 240MG .  INCREASE YOUR AMIODARONE TO 300MG  ONCE DAILY. YOU WILL TAKE 1.5 TABLETS DAILY.  *If you need a refill on your cardiac medications before your next appointment, please call your pharmacy*  Lab Work: NONE If you have labs (blood work) drawn today and your tests are completely normal, you will receive your results only by: MyChart Message (if you have MyChart) OR A paper copy in the mail If you have any lab test that is abnormal or we need to change your treatment, we will call you to review the results.  Testing/Procedures: NONE   Follow-Up: At [facility], you and your health needs are our priority.  As part of our continuing mission to provide you with exceptional heart care, we have created designated Provider Care Teams.  These Care Teams include your primary Cardiologist (physician) and Advanced Practice Providers (APPs -  Physician Assistants and Nurse Practitioners) who all work together to provide you with the care you need, when you need it.  We recommend signing up for the patient portal called "MyChart".  Sign up information is provided on this After Visit Summary.  MyChart is used to connect with patients for Virtual Visits (Telemedicine).  Patients are able to view lab/test results, encounter notes, upcoming appointments, etc.  Non-urgent messages can be sent to your provider as well.   To learn more about what you can do with MyChart, go to [email or web address].    Your next appointment:   1 month(s)  Provider:   Sherryl Manges, MD    YOU WILL RETURN TO SEE DR. KELLY IN 2 MONTHS. OK TO DOUBLE BOOK PER DR. KELLY.

## 2022-12-29 ENCOUNTER — Other Ambulatory Visit: Payer: Medicare PPO

## 2022-12-29 DIAGNOSIS — N184 Chronic kidney disease, stage 4 (severe): Secondary | ICD-10-CM

## 2023-01-01 ENCOUNTER — Encounter: Payer: Self-pay | Admitting: Cardiovascular Disease

## 2023-01-03 NOTE — Addendum Note (Signed)
Addended by: Brunetta Genera on: 01/03/2023 09:35 AM   Modules accepted: Orders

## 2023-01-04 ENCOUNTER — Encounter: Payer: Self-pay | Admitting: Cardiovascular Disease

## 2023-01-11 DIAGNOSIS — Z125 Encounter for screening for malignant neoplasm of prostate: Secondary | ICD-10-CM

## 2023-01-11 HISTORY — DX: Encounter for screening for malignant neoplasm of prostate: Z12.5

## 2023-01-20 DIAGNOSIS — G4733 Obstructive sleep apnea (adult) (pediatric): Secondary | ICD-10-CM | POA: Diagnosis not present

## 2023-01-23 ENCOUNTER — Telehealth: Payer: Self-pay

## 2023-01-23 ENCOUNTER — Other Ambulatory Visit: Payer: Self-pay | Admitting: Cardiovascular Disease

## 2023-01-23 DIAGNOSIS — I480 Paroxysmal atrial fibrillation: Secondary | ICD-10-CM

## 2023-01-23 NOTE — Telephone Encounter (Signed)
Prescription refill request for Eliquis received. Indication:afib Last office visit:7/24 Scr:2.89 Age: 82 Weight:101.2  kg  Prescription refill under review

## 2023-01-23 NOTE — Telephone Encounter (Signed)
Lpmtcb and discuss Eliquis dose reduction.

## 2023-01-26 ENCOUNTER — Ambulatory Visit (INDEPENDENT_AMBULATORY_CARE_PROVIDER_SITE_OTHER): Payer: Medicare PPO | Admitting: Podiatry

## 2023-01-26 ENCOUNTER — Encounter: Payer: Self-pay | Admitting: Podiatry

## 2023-01-26 DIAGNOSIS — E119 Type 2 diabetes mellitus without complications: Secondary | ICD-10-CM

## 2023-01-26 DIAGNOSIS — B351 Tinea unguium: Secondary | ICD-10-CM | POA: Diagnosis not present

## 2023-01-26 DIAGNOSIS — M79675 Pain in left toe(s): Secondary | ICD-10-CM

## 2023-01-26 DIAGNOSIS — M79674 Pain in right toe(s): Secondary | ICD-10-CM

## 2023-01-26 NOTE — Progress Notes (Signed)
This patient returns to my office for at risk foot care.  This patient requires this care by a professional since this patient will be at risk due to having diabetes and renal insufficiency and thrombocytopenia..  Patient is taking eliquis.  This patient is unable to cut nails himself since the patient cannot reach his nails.These nails are painful walking and wearing shoes.  This patient presents for at risk foot care today.  General Appearance  Alert, conversant and in no acute stress.  Vascular  Dorsalis pedis and posterior tibial  pulses are palpable  bilaterally.  Capillary return is within normal limits  bilaterally. Temperature is within normal limits  bilaterally.  Neurologic  Senn-Weinstein monofilament wire test within normal limits  bilaterally. Muscle power within normal limits bilaterally.  Nails Thick disfigured discolored nails with subungual debris  from hallux to fifth toes bilaterally. No evidence of bacterial infection or drainage bilaterally.  Orthopedic  No limitations of motion  feet .  No crepitus or effusions noted.  No bony pathology or digital deformities noted.  HAV  B/L.  Skin  normotropic skin with no porokeratosis noted bilaterally.  No signs of infections or ulcers noted.     Onychomycosis  Pain in right toes  Pain in left toes  Consent was obtained for treatment procedures.   Mechanical debridement of nails 1-5  bilaterally performed with a nail nipper.  Filed with dremel without incident.    Return office visit   3 months                   Told patient to return for periodic foot care and evaluation due to potential at risk complications.   Gregory Mayer DPM  

## 2023-01-31 DIAGNOSIS — N184 Chronic kidney disease, stage 4 (severe): Secondary | ICD-10-CM | POA: Diagnosis not present

## 2023-02-07 DIAGNOSIS — N2581 Secondary hyperparathyroidism of renal origin: Secondary | ICD-10-CM | POA: Diagnosis not present

## 2023-02-07 DIAGNOSIS — E1129 Type 2 diabetes mellitus with other diabetic kidney complication: Secondary | ICD-10-CM | POA: Diagnosis not present

## 2023-02-07 DIAGNOSIS — I4891 Unspecified atrial fibrillation: Secondary | ICD-10-CM | POA: Diagnosis not present

## 2023-02-07 DIAGNOSIS — N184 Chronic kidney disease, stage 4 (severe): Secondary | ICD-10-CM | POA: Diagnosis not present

## 2023-02-07 DIAGNOSIS — E1122 Type 2 diabetes mellitus with diabetic chronic kidney disease: Secondary | ICD-10-CM | POA: Diagnosis not present

## 2023-02-07 DIAGNOSIS — I129 Hypertensive chronic kidney disease with stage 1 through stage 4 chronic kidney disease, or unspecified chronic kidney disease: Secondary | ICD-10-CM | POA: Diagnosis not present

## 2023-02-10 ENCOUNTER — Other Ambulatory Visit: Payer: Self-pay | Admitting: Internal Medicine

## 2023-02-10 DIAGNOSIS — E210 Primary hyperparathyroidism: Secondary | ICD-10-CM | POA: Diagnosis not present

## 2023-02-10 DIAGNOSIS — Z87442 Personal history of urinary calculi: Secondary | ICD-10-CM | POA: Diagnosis not present

## 2023-02-10 DIAGNOSIS — Q613 Polycystic kidney, unspecified: Secondary | ICD-10-CM | POA: Diagnosis not present

## 2023-02-10 DIAGNOSIS — E119 Type 2 diabetes mellitus without complications: Secondary | ICD-10-CM | POA: Diagnosis not present

## 2023-02-10 DIAGNOSIS — Z13820 Encounter for screening for osteoporosis: Secondary | ICD-10-CM

## 2023-02-10 DIAGNOSIS — E669 Obesity, unspecified: Secondary | ICD-10-CM | POA: Diagnosis not present

## 2023-02-10 DIAGNOSIS — N184 Chronic kidney disease, stage 4 (severe): Secondary | ICD-10-CM | POA: Diagnosis not present

## 2023-02-14 DIAGNOSIS — E210 Primary hyperparathyroidism: Secondary | ICD-10-CM | POA: Diagnosis not present

## 2023-02-14 DIAGNOSIS — Z87442 Personal history of urinary calculi: Secondary | ICD-10-CM | POA: Diagnosis not present

## 2023-02-14 DIAGNOSIS — Q613 Polycystic kidney, unspecified: Secondary | ICD-10-CM | POA: Diagnosis not present

## 2023-02-20 DIAGNOSIS — G4733 Obstructive sleep apnea (adult) (pediatric): Secondary | ICD-10-CM | POA: Diagnosis not present

## 2023-02-22 NOTE — Progress Notes (Addendum)
102  101  126   BUN 7 - 25 mg/dL 34  26  28   Creatinine 0.70 - 1.22 mg/dL 1.61  0.96  0.45   BUN/Creat Ratio 6 - 22 (calc) 12  10  13    Sodium 135 - 146 mmol/L 142  142  143   Potassium 3.5 - 5.3 mmol/L 4.7  5.1  4.9   Chloride 98 - 110 mmol/L 109  107  106   CO2 20 - 32 mmol/L 24  24  25    Calcium 8.6 - 10.3 mg/dL 40.9  81.1  9.8       Latest Ref Rng & Units 12/26/2022    8:53 AM 10/22/2021    9:53 AM 04/16/2021    9:17 AM  Hepatic Function  Total Protein 6.1 - 8.1 g/dL 6.6  6.6  7.0   Albumin 3.7 - 4.7 g/dL  4.3  4.3   AST 10 - 35 U/L 18  22  28    ALT 9 - 46 U/L 27  37  48   Alk Phosphatase 44 - 121 IU/L  60  61   Total Bilirubin 0.2 - 1.2 mg/dL 0.7  0.6  0.5       Latest Ref Rng & Units 12/26/2022    8:53 AM 10/22/2021    9:53 AM 04/16/2021    9:17 AM  CBC  WBC 3.8 - 10.8 Thousand/uL 5.6  5.1  5.5   Hemoglobin 13.2 - 17.1 g/dL 91.4  78.2  95.6   Hematocrit 38.5 - 50.0 % 45.3  44.6  43.0   Platelets 140 - 400 Thousand/uL 123  99  125    Lab Results  Component Value Date   TSH 2.66 12/26/2022   Lipid Panel     Component Value Date/Time   CHOL 113 12/26/2022 0853   TRIG 180 (H) 12/26/2022 0853   HDL 37 (L) 12/26/2022 0853   CHOLHDL 3.1 12/26/2022 0853   VLDL 37 (H) 10/25/2016 0853   LDLCALC 50 12/26/2022 0853   LDLDIRECT 65.0 04/12/2013 0825     RADIOLOGY: No results found.    Patient Name:  Martin Cordova, Martin Cordova Date: 09/27/2022 Gender: Male D.O.B: 1941/03/06 Age (years): 56 Referring Provider: Nicki Guadalajara MD, ABSM Height (inches): 71 Interpreting Physician: Nicki Guadalajara MD, ABSM Weight (lbs): 227 RPSGT: Armen Pickup BMI: 32 MRN: [id number] Neck Size: 17.00   CLINICAL INFORMATION The patient is referred for a BiPAP/ASV titration to treat sleep apnea.   Patient has been using BiPAP Auto SV with an old [employer] Dream Station BiPAP auto SV unit.   SLEEP STUDY TECHNIQUE As per the AASM Manual for the Scoring of Sleep and Associated Events v2.3 (April 2016) with a hypopnea requiring 4% desaturations.   The channels recorded and monitored were frontal, central and occipital EEG, electrooculogram (EOG), submentalis EMG (chin), nasal and oral airflow, thoracic and abdominal wall motion, anterior tibialis EMG, snore microphone, electrocardiogram, and pulse oximetry. Bilevel positive airway pressure (BPAP) was initiated at the beginning of the study and titrated to treat sleep-disordered breathing.   MEDICATIONS amiodarone (PACERONE) 200 MG tablet apixaban (ELIQUIS) 5 MG TABS tablet atorvastatin (LIPITOR) 10 MG tablet carvedilol (COREG) 12.5 MG tablet diltiazem (CARDIZEM CD) 240 MG 24 hr capsule empagliflozin (JARDIANCE) 10 MG TABS tablet fluticasone (FLONASE) 50 MCG/ACT nasal spray furosemide (LASIX) 40 MG tablet hydrALAZINE (APRESOLINE) 25 MG tablet omega-3 acid ethyl esters (LOVAZA) 1 g capsule Medications self-administered by patient taken the night of the study : N/A  Patient ID: Martin Cordova, male   DOB: January 27, 1941, 81 y.o.   MRN: [id number]       HPI: Martin Cordova is a 82 y.o. male is who presents to the office today for a 2 month follow-up cardiology clinic evaluation.  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 In the past, he developed an obstructive uropathy attributed to kidney stones; creatinine had risen up to 3 and ultimately improved and stabilized at approximately 1.7. Martin Cordova uses his BiPAP daily and does note good sleep.  In the past he had issues with mild peripheral edema, which ultimately improved.  He had been previously taken off his lisinopril when his creatinine had risen in the setting of his obstructive uropathy.  In 2015 he developed recurrent episodes of palpitations and was found to have recurrent paroxysmal atrial fibrillation/flutter.  His medications were adjusted and  he was started on antiarrhythmic therapy with Rythmol to take in addition to his increasing doses of carvedilol.  He was started on Eliquis for anticoagulation.  He underwent successful cardioversion on 08/05/2013.  Since that time, he is unaware of any recurrent atrial fibrillation.  He has noticed more energy. He had been maintained on 50 mg twice a day of carvedilol in addition to Rythmol 225 mg every 8 hours, but due to slow pulse rates, these doses have ultimately been reduced.  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 He has been on CPAP therapy initially for approximately 8 years and has been on BiPAP auto SV for over 4 years. He admits to using his BiPAP with 100% compliance.  He obtained a new BiPAP machine in June.  He feels that his machine is  significantly improved from his prior one. When I last saw him I reviewed his  download of his new BiPAP auto SV machine indicates that 90% of the time is EPAP pressure was 17 and 90% of the time his pressure support was 5.8, giving an IPAP of 21.8 cm.  He is using it 100% of the time and is averaging 5 hours and 49 minutes on his most recent download from 12/07/2013 through 01/05/2014.  His average AHI was 7.5.  His device setting maximum BiPAP pressure is 25 cm in maximum EPAP pressure 21 cm.  His average central event index is 1.3, and average obstructive apneic index 0.2, with an average copy index of 6.0.  Average breath.  Rate is 18 breaths per minute with a minute ventilation of 12.1.  Average total body may 670 mL.  When I saw him on 10/21/15 he was unaware of any arrhythmia.  He was on carvedilol 12.5 mg twice a day, Rythmol 225 mg twice a day, hydralazine 25 mg twice a day and eliquis  5 mg twice a day.  He also has been taking omeprazole for GERD and atorvastatin 10 mg for hyperlipidemia. He was found to have recurrent atrial flutter with variable block.  At that time, I recommended that he increase his Rythmol and take 225 mg every 8 hours and further increased his carvedilol to 18.75 mg twice a day.  Laboratory had revealed a magnesium of 2.0.  He has stage III chronic kidney  102  101  126   BUN 7 - 25 mg/dL 34  26  28   Creatinine 0.70 - 1.22 mg/dL 1.61  0.96  0.45   BUN/Creat Ratio 6 - 22 (calc) 12  10  13    Sodium 135 - 146 mmol/L 142  142  143   Potassium 3.5 - 5.3 mmol/L 4.7  5.1  4.9   Chloride 98 - 110 mmol/L 109  107  106   CO2 20 - 32 mmol/L 24  24  25    Calcium 8.6 - 10.3 mg/dL 40.9  81.1  9.8       Latest Ref Rng & Units 12/26/2022    8:53 AM 10/22/2021    9:53 AM 04/16/2021    9:17 AM  Hepatic Function  Total Protein 6.1 - 8.1 g/dL 6.6  6.6  7.0   Albumin 3.7 - 4.7 g/dL  4.3  4.3   AST 10 - 35 U/L 18  22  28    ALT 9 - 46 U/L 27  37  48   Alk Phosphatase 44 - 121 IU/L  60  61   Total Bilirubin 0.2 - 1.2 mg/dL 0.7  0.6  0.5       Latest Ref Rng & Units 12/26/2022    8:53 AM 10/22/2021    9:53 AM 04/16/2021    9:17 AM  CBC  WBC 3.8 - 10.8 Thousand/uL 5.6  5.1  5.5   Hemoglobin 13.2 - 17.1 g/dL 91.4  78.2  95.6   Hematocrit 38.5 - 50.0 % 45.3  44.6  43.0   Platelets 140 - 400 Thousand/uL 123  99  125    Lab Results  Component Value Date   TSH 2.66 12/26/2022   Lipid Panel     Component Value Date/Time   CHOL 113 12/26/2022 0853   TRIG 180 (H) 12/26/2022 0853   HDL 37 (L) 12/26/2022 0853   CHOLHDL 3.1 12/26/2022 0853   VLDL 37 (H) 10/25/2016 0853   LDLCALC 50 12/26/2022 0853   LDLDIRECT 65.0 04/12/2013 0825     RADIOLOGY: No results found.    Patient Name:  Martin Cordova, Martin Cordova Date: 09/27/2022 Gender: Male D.O.B: 1941/03/06 Age (years): 56 Referring Provider: Nicki Guadalajara MD, ABSM Height (inches): 71 Interpreting Physician: Nicki Guadalajara MD, ABSM Weight (lbs): 227 RPSGT: Armen Pickup BMI: 32 MRN: [id number] Neck Size: 17.00   CLINICAL INFORMATION The patient is referred for a BiPAP/ASV titration to treat sleep apnea.   Patient has been using BiPAP Auto SV with an old [employer] Dream Station BiPAP auto SV unit.   SLEEP STUDY TECHNIQUE As per the AASM Manual for the Scoring of Sleep and Associated Events v2.3 (April 2016) with a hypopnea requiring 4% desaturations.   The channels recorded and monitored were frontal, central and occipital EEG, electrooculogram (EOG), submentalis EMG (chin), nasal and oral airflow, thoracic and abdominal wall motion, anterior tibialis EMG, snore microphone, electrocardiogram, and pulse oximetry. Bilevel positive airway pressure (BPAP) was initiated at the beginning of the study and titrated to treat sleep-disordered breathing.   MEDICATIONS amiodarone (PACERONE) 200 MG tablet apixaban (ELIQUIS) 5 MG TABS tablet atorvastatin (LIPITOR) 10 MG tablet carvedilol (COREG) 12.5 MG tablet diltiazem (CARDIZEM CD) 240 MG 24 hr capsule empagliflozin (JARDIANCE) 10 MG TABS tablet fluticasone (FLONASE) 50 MCG/ACT nasal spray furosemide (LASIX) 40 MG tablet hydrALAZINE (APRESOLINE) 25 MG tablet omega-3 acid ethyl esters (LOVAZA) 1 g capsule Medications self-administered by patient taken the night of the study : N/A  102  101  126   BUN 7 - 25 mg/dL 34  26  28   Creatinine 0.70 - 1.22 mg/dL 1.61  0.96  0.45   BUN/Creat Ratio 6 - 22 (calc) 12  10  13    Sodium 135 - 146 mmol/L 142  142  143   Potassium 3.5 - 5.3 mmol/L 4.7  5.1  4.9   Chloride 98 - 110 mmol/L 109  107  106   CO2 20 - 32 mmol/L 24  24  25    Calcium 8.6 - 10.3 mg/dL 40.9  81.1  9.8       Latest Ref Rng & Units 12/26/2022    8:53 AM 10/22/2021    9:53 AM 04/16/2021    9:17 AM  Hepatic Function  Total Protein 6.1 - 8.1 g/dL 6.6  6.6  7.0   Albumin 3.7 - 4.7 g/dL  4.3  4.3   AST 10 - 35 U/L 18  22  28    ALT 9 - 46 U/L 27  37  48   Alk Phosphatase 44 - 121 IU/L  60  61   Total Bilirubin 0.2 - 1.2 mg/dL 0.7  0.6  0.5       Latest Ref Rng & Units 12/26/2022    8:53 AM 10/22/2021    9:53 AM 04/16/2021    9:17 AM  CBC  WBC 3.8 - 10.8 Thousand/uL 5.6  5.1  5.5   Hemoglobin 13.2 - 17.1 g/dL 91.4  78.2  95.6   Hematocrit 38.5 - 50.0 % 45.3  44.6  43.0   Platelets 140 - 400 Thousand/uL 123  99  125    Lab Results  Component Value Date   TSH 2.66 12/26/2022   Lipid Panel     Component Value Date/Time   CHOL 113 12/26/2022 0853   TRIG 180 (H) 12/26/2022 0853   HDL 37 (L) 12/26/2022 0853   CHOLHDL 3.1 12/26/2022 0853   VLDL 37 (H) 10/25/2016 0853   LDLCALC 50 12/26/2022 0853   LDLDIRECT 65.0 04/12/2013 0825     RADIOLOGY: No results found.    Patient Name:  Martin Cordova, Martin Cordova Date: 09/27/2022 Gender: Male D.O.B: 1941/03/06 Age (years): 56 Referring Provider: Nicki Guadalajara MD, ABSM Height (inches): 71 Interpreting Physician: Nicki Guadalajara MD, ABSM Weight (lbs): 227 RPSGT: Armen Pickup BMI: 32 MRN: [id number] Neck Size: 17.00   CLINICAL INFORMATION The patient is referred for a BiPAP/ASV titration to treat sleep apnea.   Patient has been using BiPAP Auto SV with an old [employer] Dream Station BiPAP auto SV unit.   SLEEP STUDY TECHNIQUE As per the AASM Manual for the Scoring of Sleep and Associated Events v2.3 (April 2016) with a hypopnea requiring 4% desaturations.   The channels recorded and monitored were frontal, central and occipital EEG, electrooculogram (EOG), submentalis EMG (chin), nasal and oral airflow, thoracic and abdominal wall motion, anterior tibialis EMG, snore microphone, electrocardiogram, and pulse oximetry. Bilevel positive airway pressure (BPAP) was initiated at the beginning of the study and titrated to treat sleep-disordered breathing.   MEDICATIONS amiodarone (PACERONE) 200 MG tablet apixaban (ELIQUIS) 5 MG TABS tablet atorvastatin (LIPITOR) 10 MG tablet carvedilol (COREG) 12.5 MG tablet diltiazem (CARDIZEM CD) 240 MG 24 hr capsule empagliflozin (JARDIANCE) 10 MG TABS tablet fluticasone (FLONASE) 50 MCG/ACT nasal spray furosemide (LASIX) 40 MG tablet hydrALAZINE (APRESOLINE) 25 MG tablet omega-3 acid ethyl esters (LOVAZA) 1 g capsule Medications self-administered by patient taken the night of the study : N/A  Patient ID: Martin Cordova, male   DOB: January 27, 1941, 81 y.o.   MRN: [id number]       HPI: Martin Cordova is a 82 y.o. male is who presents to the office today for a 2 month follow-up cardiology clinic evaluation.  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 In the past, he developed an obstructive uropathy attributed to kidney stones; creatinine had risen up to 3 and ultimately improved and stabilized at approximately 1.7. Martin Cordova uses his BiPAP daily and does note good sleep.  In the past he had issues with mild peripheral edema, which ultimately improved.  He had been previously taken off his lisinopril when his creatinine had risen in the setting of his obstructive uropathy.  In 2015 he developed recurrent episodes of palpitations and was found to have recurrent paroxysmal atrial fibrillation/flutter.  His medications were adjusted and  he was started on antiarrhythmic therapy with Rythmol to take in addition to his increasing doses of carvedilol.  He was started on Eliquis for anticoagulation.  He underwent successful cardioversion on 08/05/2013.  Since that time, he is unaware of any recurrent atrial fibrillation.  He has noticed more energy. He had been maintained on 50 mg twice a day of carvedilol in addition to Rythmol 225 mg every 8 hours, but due to slow pulse rates, these doses have ultimately been reduced.  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 He has been on CPAP therapy initially for approximately 8 years and has been on BiPAP auto SV for over 4 years. He admits to using his BiPAP with 100% compliance.  He obtained a new BiPAP machine in June.  He feels that his machine is  significantly improved from his prior one. When I last saw him I reviewed his  download of his new BiPAP auto SV machine indicates that 90% of the time is EPAP pressure was 17 and 90% of the time his pressure support was 5.8, giving an IPAP of 21.8 cm.  He is using it 100% of the time and is averaging 5 hours and 49 minutes on his most recent download from 12/07/2013 through 01/05/2014.  His average AHI was 7.5.  His device setting maximum BiPAP pressure is 25 cm in maximum EPAP pressure 21 cm.  His average central event index is 1.3, and average obstructive apneic index 0.2, with an average copy index of 6.0.  Average breath.  Rate is 18 breaths per minute with a minute ventilation of 12.1.  Average total body may 670 mL.  When I saw him on 10/21/15 he was unaware of any arrhythmia.  He was on carvedilol 12.5 mg twice a day, Rythmol 225 mg twice a day, hydralazine 25 mg twice a day and eliquis  5 mg twice a day.  He also has been taking omeprazole for GERD and atorvastatin 10 mg for hyperlipidemia. He was found to have recurrent atrial flutter with variable block.  At that time, I recommended that he increase his Rythmol and take 225 mg every 8 hours and further increased his carvedilol to 18.75 mg twice a day.  Laboratory had revealed a magnesium of 2.0.  He has stage III chronic kidney  Patient ID: Martin Cordova, male   DOB: January 27, 1941, 81 y.o.   MRN: [id number]       HPI: Martin Cordova is a 82 y.o. male is who presents to the office today for a 2 month follow-up cardiology clinic evaluation.  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 In the past, he developed an obstructive uropathy attributed to kidney stones; creatinine had risen up to 3 and ultimately improved and stabilized at approximately 1.7. Martin Cordova uses his BiPAP daily and does note good sleep.  In the past he had issues with mild peripheral edema, which ultimately improved.  He had been previously taken off his lisinopril when his creatinine had risen in the setting of his obstructive uropathy.  In 2015 he developed recurrent episodes of palpitations and was found to have recurrent paroxysmal atrial fibrillation/flutter.  His medications were adjusted and  he was started on antiarrhythmic therapy with Rythmol to take in addition to his increasing doses of carvedilol.  He was started on Eliquis for anticoagulation.  He underwent successful cardioversion on 08/05/2013.  Since that time, he is unaware of any recurrent atrial fibrillation.  He has noticed more energy. He had been maintained on 50 mg twice a day of carvedilol in addition to Rythmol 225 mg every 8 hours, but due to slow pulse rates, these doses have ultimately been reduced.  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 He has been on CPAP therapy initially for approximately 8 years and has been on BiPAP auto SV for over 4 years. He admits to using his BiPAP with 100% compliance.  He obtained a new BiPAP machine in June.  He feels that his machine is  significantly improved from his prior one. When I last saw him I reviewed his  download of his new BiPAP auto SV machine indicates that 90% of the time is EPAP pressure was 17 and 90% of the time his pressure support was 5.8, giving an IPAP of 21.8 cm.  He is using it 100% of the time and is averaging 5 hours and 49 minutes on his most recent download from 12/07/2013 through 01/05/2014.  His average AHI was 7.5.  His device setting maximum BiPAP pressure is 25 cm in maximum EPAP pressure 21 cm.  His average central event index is 1.3, and average obstructive apneic index 0.2, with an average copy index of 6.0.  Average breath.  Rate is 18 breaths per minute with a minute ventilation of 12.1.  Average total body may 670 mL.  When I saw him on 10/21/15 he was unaware of any arrhythmia.  He was on carvedilol 12.5 mg twice a day, Rythmol 225 mg twice a day, hydralazine 25 mg twice a day and eliquis  5 mg twice a day.  He also has been taking omeprazole for GERD and atorvastatin 10 mg for hyperlipidemia. He was found to have recurrent atrial flutter with variable block.  At that time, I recommended that he increase his Rythmol and take 225 mg every 8 hours and further increased his carvedilol to 18.75 mg twice a day.  Laboratory had revealed a magnesium of 2.0.  He has stage III chronic kidney  Patient ID: Martin Cordova, male   DOB: January 27, 1941, 81 y.o.   MRN: [id number]       HPI: Martin Cordova is a 82 y.o. male is who presents to the office today for a 2 month follow-up cardiology clinic evaluation.  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 In the past, he developed an obstructive uropathy attributed to kidney stones; creatinine had risen up to 3 and ultimately improved and stabilized at approximately 1.7. Martin Cordova uses his BiPAP daily and does note good sleep.  In the past he had issues with mild peripheral edema, which ultimately improved.  He had been previously taken off his lisinopril when his creatinine had risen in the setting of his obstructive uropathy.  In 2015 he developed recurrent episodes of palpitations and was found to have recurrent paroxysmal atrial fibrillation/flutter.  His medications were adjusted and  he was started on antiarrhythmic therapy with Rythmol to take in addition to his increasing doses of carvedilol.  He was started on Eliquis for anticoagulation.  He underwent successful cardioversion on 08/05/2013.  Since that time, he is unaware of any recurrent atrial fibrillation.  He has noticed more energy. He had been maintained on 50 mg twice a day of carvedilol in addition to Rythmol 225 mg every 8 hours, but due to slow pulse rates, these doses have ultimately been reduced.  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 He has been on CPAP therapy initially for approximately 8 years and has been on BiPAP auto SV for over 4 years. He admits to using his BiPAP with 100% compliance.  He obtained a new BiPAP machine in June.  He feels that his machine is  significantly improved from his prior one. When I last saw him I reviewed his  download of his new BiPAP auto SV machine indicates that 90% of the time is EPAP pressure was 17 and 90% of the time his pressure support was 5.8, giving an IPAP of 21.8 cm.  He is using it 100% of the time and is averaging 5 hours and 49 minutes on his most recent download from 12/07/2013 through 01/05/2014.  His average AHI was 7.5.  His device setting maximum BiPAP pressure is 25 cm in maximum EPAP pressure 21 cm.  His average central event index is 1.3, and average obstructive apneic index 0.2, with an average copy index of 6.0.  Average breath.  Rate is 18 breaths per minute with a minute ventilation of 12.1.  Average total body may 670 mL.  When I saw him on 10/21/15 he was unaware of any arrhythmia.  He was on carvedilol 12.5 mg twice a day, Rythmol 225 mg twice a day, hydralazine 25 mg twice a day and eliquis  5 mg twice a day.  He also has been taking omeprazole for GERD and atorvastatin 10 mg for hyperlipidemia. He was found to have recurrent atrial flutter with variable block.  At that time, I recommended that he increase his Rythmol and take 225 mg every 8 hours and further increased his carvedilol to 18.75 mg twice a day.  Laboratory had revealed a magnesium of 2.0.  He has stage III chronic kidney  Patient ID: Martin Cordova, male   DOB: January 27, 1941, 81 y.o.   MRN: [id number]       HPI: Martin Cordova is a 82 y.o. male is who presents to the office today for a 2 month follow-up cardiology clinic evaluation.  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 In the past, he developed an obstructive uropathy attributed to kidney stones; creatinine had risen up to 3 and ultimately improved and stabilized at approximately 1.7. Martin Cordova uses his BiPAP daily and does note good sleep.  In the past he had issues with mild peripheral edema, which ultimately improved.  He had been previously taken off his lisinopril when his creatinine had risen in the setting of his obstructive uropathy.  In 2015 he developed recurrent episodes of palpitations and was found to have recurrent paroxysmal atrial fibrillation/flutter.  His medications were adjusted and  he was started on antiarrhythmic therapy with Rythmol to take in addition to his increasing doses of carvedilol.  He was started on Eliquis for anticoagulation.  He underwent successful cardioversion on 08/05/2013.  Since that time, he is unaware of any recurrent atrial fibrillation.  He has noticed more energy. He had been maintained on 50 mg twice a day of carvedilol in addition to Rythmol 225 mg every 8 hours, but due to slow pulse rates, these doses have ultimately been reduced.  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 He has been on CPAP therapy initially for approximately 8 years and has been on BiPAP auto SV for over 4 years. He admits to using his BiPAP with 100% compliance.  He obtained a new BiPAP machine in June.  He feels that his machine is  significantly improved from his prior one. When I last saw him I reviewed his  download of his new BiPAP auto SV machine indicates that 90% of the time is EPAP pressure was 17 and 90% of the time his pressure support was 5.8, giving an IPAP of 21.8 cm.  He is using it 100% of the time and is averaging 5 hours and 49 minutes on his most recent download from 12/07/2013 through 01/05/2014.  His average AHI was 7.5.  His device setting maximum BiPAP pressure is 25 cm in maximum EPAP pressure 21 cm.  His average central event index is 1.3, and average obstructive apneic index 0.2, with an average copy index of 6.0.  Average breath.  Rate is 18 breaths per minute with a minute ventilation of 12.1.  Average total body may 670 mL.  When I saw him on 10/21/15 he was unaware of any arrhythmia.  He was on carvedilol 12.5 mg twice a day, Rythmol 225 mg twice a day, hydralazine 25 mg twice a day and eliquis  5 mg twice a day.  He also has been taking omeprazole for GERD and atorvastatin 10 mg for hyperlipidemia. He was found to have recurrent atrial flutter with variable block.  At that time, I recommended that he increase his Rythmol and take 225 mg every 8 hours and further increased his carvedilol to 18.75 mg twice a day.  Laboratory had revealed a magnesium of 2.0.  He has stage III chronic kidney  Patient ID: Martin Cordova, male   DOB: January 27, 1941, 81 y.o.   MRN: [id number]       HPI: Martin Cordova is a 82 y.o. male is who presents to the office today for a 2 month follow-up cardiology clinic evaluation.  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 In the past, he developed an obstructive uropathy attributed to kidney stones; creatinine had risen up to 3 and ultimately improved and stabilized at approximately 1.7. Martin Cordova uses his BiPAP daily and does note good sleep.  In the past he had issues with mild peripheral edema, which ultimately improved.  He had been previously taken off his lisinopril when his creatinine had risen in the setting of his obstructive uropathy.  In 2015 he developed recurrent episodes of palpitations and was found to have recurrent paroxysmal atrial fibrillation/flutter.  His medications were adjusted and  he was started on antiarrhythmic therapy with Rythmol to take in addition to his increasing doses of carvedilol.  He was started on Eliquis for anticoagulation.  He underwent successful cardioversion on 08/05/2013.  Since that time, he is unaware of any recurrent atrial fibrillation.  He has noticed more energy. He had been maintained on 50 mg twice a day of carvedilol in addition to Rythmol 225 mg every 8 hours, but due to slow pulse rates, these doses have ultimately been reduced.  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 He has been on CPAP therapy initially for approximately 8 years and has been on BiPAP auto SV for over 4 years. He admits to using his BiPAP with 100% compliance.  He obtained a new BiPAP machine in June.  He feels that his machine is  significantly improved from his prior one. When I last saw him I reviewed his  download of his new BiPAP auto SV machine indicates that 90% of the time is EPAP pressure was 17 and 90% of the time his pressure support was 5.8, giving an IPAP of 21.8 cm.  He is using it 100% of the time and is averaging 5 hours and 49 minutes on his most recent download from 12/07/2013 through 01/05/2014.  His average AHI was 7.5.  His device setting maximum BiPAP pressure is 25 cm in maximum EPAP pressure 21 cm.  His average central event index is 1.3, and average obstructive apneic index 0.2, with an average copy index of 6.0.  Average breath.  Rate is 18 breaths per minute with a minute ventilation of 12.1.  Average total body may 670 mL.  When I saw him on 10/21/15 he was unaware of any arrhythmia.  He was on carvedilol 12.5 mg twice a day, Rythmol 225 mg twice a day, hydralazine 25 mg twice a day and eliquis  5 mg twice a day.  He also has been taking omeprazole for GERD and atorvastatin 10 mg for hyperlipidemia. He was found to have recurrent atrial flutter with variable block.  At that time, I recommended that he increase his Rythmol and take 225 mg every 8 hours and further increased his carvedilol to 18.75 mg twice a day.  Laboratory had revealed a magnesium of 2.0.  He has stage III chronic kidney  102  101  126   BUN 7 - 25 mg/dL 34  26  28   Creatinine 0.70 - 1.22 mg/dL 1.61  0.96  0.45   BUN/Creat Ratio 6 - 22 (calc) 12  10  13    Sodium 135 - 146 mmol/L 142  142  143   Potassium 3.5 - 5.3 mmol/L 4.7  5.1  4.9   Chloride 98 - 110 mmol/L 109  107  106   CO2 20 - 32 mmol/L 24  24  25    Calcium 8.6 - 10.3 mg/dL 40.9  81.1  9.8       Latest Ref Rng & Units 12/26/2022    8:53 AM 10/22/2021    9:53 AM 04/16/2021    9:17 AM  Hepatic Function  Total Protein 6.1 - 8.1 g/dL 6.6  6.6  7.0   Albumin 3.7 - 4.7 g/dL  4.3  4.3   AST 10 - 35 U/L 18  22  28    ALT 9 - 46 U/L 27  37  48   Alk Phosphatase 44 - 121 IU/L  60  61   Total Bilirubin 0.2 - 1.2 mg/dL 0.7  0.6  0.5       Latest Ref Rng & Units 12/26/2022    8:53 AM 10/22/2021    9:53 AM 04/16/2021    9:17 AM  CBC  WBC 3.8 - 10.8 Thousand/uL 5.6  5.1  5.5   Hemoglobin 13.2 - 17.1 g/dL 91.4  78.2  95.6   Hematocrit 38.5 - 50.0 % 45.3  44.6  43.0   Platelets 140 - 400 Thousand/uL 123  99  125    Lab Results  Component Value Date   TSH 2.66 12/26/2022   Lipid Panel     Component Value Date/Time   CHOL 113 12/26/2022 0853   TRIG 180 (H) 12/26/2022 0853   HDL 37 (L) 12/26/2022 0853   CHOLHDL 3.1 12/26/2022 0853   VLDL 37 (H) 10/25/2016 0853   LDLCALC 50 12/26/2022 0853   LDLDIRECT 65.0 04/12/2013 0825     RADIOLOGY: No results found.    Patient Name:  Martin Cordova, Martin Cordova Date: 09/27/2022 Gender: Male D.O.B: 1941/03/06 Age (years): 56 Referring Provider: Nicki Guadalajara MD, ABSM Height (inches): 71 Interpreting Physician: Nicki Guadalajara MD, ABSM Weight (lbs): 227 RPSGT: Armen Pickup BMI: 32 MRN: [id number] Neck Size: 17.00   CLINICAL INFORMATION The patient is referred for a BiPAP/ASV titration to treat sleep apnea.   Patient has been using BiPAP Auto SV with an old [employer] BiPAP auto SV unit.   SLEEP STUDY TECHNIQUE As per the AASM Manual for the Scoring of Sleep and Associated Events v2.3 (April 2016) with a hypopnea requiring 4% desaturations.   The channels recorded and monitored were frontal, central and occipital EEG, electrooculogram (EOG), submentalis EMG (chin), nasal and oral airflow, thoracic and abdominal wall motion, anterior tibialis EMG, snore microphone, electrocardiogram, and pulse oximetry. Bilevel positive airway pressure (BPAP) was initiated at the beginning of the study and titrated to treat sleep-disordered breathing.   MEDICATIONS amiodarone (PACERONE) 200 MG tablet apixaban (ELIQUIS) 5 MG TABS tablet atorvastatin (LIPITOR) 10 MG tablet carvedilol (COREG) 12.5 MG tablet diltiazem (CARDIZEM CD) 240 MG 24 hr capsule empagliflozin (JARDIANCE) 10 MG TABS tablet fluticasone (FLONASE) 50 MCG/ACT nasal spray furosemide (LASIX) 40 MG tablet hydrALAZINE (APRESOLINE) 25 MG tablet omega-3 acid ethyl esters (LOVAZA) 1 g capsule Medications self-administered by patient taken the night of the study : N/A  Patient ID: Martin Cordova, male   DOB: January 27, 1941, 81 y.o.   MRN: [id number]       HPI: Martin Cordova is a 82 y.o. male is who presents to the office today for a 2 month follow-up cardiology clinic evaluation.  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 In the past, he developed an obstructive uropathy attributed to kidney stones; creatinine had risen up to 3 and ultimately improved and stabilized at approximately 1.7. Martin Cordova uses his BiPAP daily and does note good sleep.  In the past he had issues with mild peripheral edema, which ultimately improved.  He had been previously taken off his lisinopril when his creatinine had risen in the setting of his obstructive uropathy.  In 2015 he developed recurrent episodes of palpitations and was found to have recurrent paroxysmal atrial fibrillation/flutter.  His medications were adjusted and  he was started on antiarrhythmic therapy with Rythmol to take in addition to his increasing doses of carvedilol.  He was started on Eliquis for anticoagulation.  He underwent successful cardioversion on 08/05/2013.  Since that time, he is unaware of any recurrent atrial fibrillation.  He has noticed more energy. He had been maintained on 50 mg twice a day of carvedilol in addition to Rythmol 225 mg every 8 hours, but due to slow pulse rates, these doses have ultimately been reduced.  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 He has been on CPAP therapy initially for approximately 8 years and has been on BiPAP auto SV for over 4 years. He admits to using his BiPAP with 100% compliance.  He obtained a new BiPAP machine in June.  He feels that his machine is  significantly improved from his prior one. When I last saw him I reviewed his  download of his new BiPAP auto SV machine indicates that 90% of the time is EPAP pressure was 17 and 90% of the time his pressure support was 5.8, giving an IPAP of 21.8 cm.  He is using it 100% of the time and is averaging 5 hours and 49 minutes on his most recent download from 12/07/2013 through 01/05/2014.  His average AHI was 7.5.  His device setting maximum BiPAP pressure is 25 cm in maximum EPAP pressure 21 cm.  His average central event index is 1.3, and average obstructive apneic index 0.2, with an average copy index of 6.0.  Average breath.  Rate is 18 breaths per minute with a minute ventilation of 12.1.  Average total body may 670 mL.  When I saw him on 10/21/15 he was unaware of any arrhythmia.  He was on carvedilol 12.5 mg twice a day, Rythmol 225 mg twice a day, hydralazine 25 mg twice a day and eliquis  5 mg twice a day.  He also has been taking omeprazole for GERD and atorvastatin 10 mg for hyperlipidemia. He was found to have recurrent atrial flutter with variable block.  At that time, I recommended that he increase his Rythmol and take 225 mg every 8 hours and further increased his carvedilol to 18.75 mg twice a day.  Laboratory had revealed a magnesium of 2.0.  He has stage III chronic kidney  Patient ID: Martin Cordova, male   DOB: January 27, 1941, 81 y.o.   MRN: [id number]       HPI: Martin Cordova is a 82 y.o. male is who presents to the office today for a 2 month follow-up cardiology clinic evaluation.  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 In the past, he developed an obstructive uropathy attributed to kidney stones; creatinine had risen up to 3 and ultimately improved and stabilized at approximately 1.7. Martin Cordova uses his BiPAP daily and does note good sleep.  In the past he had issues with mild peripheral edema, which ultimately improved.  He had been previously taken off his lisinopril when his creatinine had risen in the setting of his obstructive uropathy.  In 2015 he developed recurrent episodes of palpitations and was found to have recurrent paroxysmal atrial fibrillation/flutter.  His medications were adjusted and  he was started on antiarrhythmic therapy with Rythmol to take in addition to his increasing doses of carvedilol.  He was started on Eliquis for anticoagulation.  He underwent successful cardioversion on 08/05/2013.  Since that time, he is unaware of any recurrent atrial fibrillation.  He has noticed more energy. He had been maintained on 50 mg twice a day of carvedilol in addition to Rythmol 225 mg every 8 hours, but due to slow pulse rates, these doses have ultimately been reduced.  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 He has been on CPAP therapy initially for approximately 8 years and has been on BiPAP auto SV for over 4 years. He admits to using his BiPAP with 100% compliance.  He obtained a new BiPAP machine in June.  He feels that his machine is  significantly improved from his prior one. When I last saw him I reviewed his  download of his new BiPAP auto SV machine indicates that 90% of the time is EPAP pressure was 17 and 90% of the time his pressure support was 5.8, giving an IPAP of 21.8 cm.  He is using it 100% of the time and is averaging 5 hours and 49 minutes on his most recent download from 12/07/2013 through 01/05/2014.  His average AHI was 7.5.  His device setting maximum BiPAP pressure is 25 cm in maximum EPAP pressure 21 cm.  His average central event index is 1.3, and average obstructive apneic index 0.2, with an average copy index of 6.0.  Average breath.  Rate is 18 breaths per minute with a minute ventilation of 12.1.  Average total body may 670 mL.  When I saw him on 10/21/15 he was unaware of any arrhythmia.  He was on carvedilol 12.5 mg twice a day, Rythmol 225 mg twice a day, hydralazine 25 mg twice a day and eliquis  5 mg twice a day.  He also has been taking omeprazole for GERD and atorvastatin 10 mg for hyperlipidemia. He was found to have recurrent atrial flutter with variable block.  At that time, I recommended that he increase his Rythmol and take 225 mg every 8 hours and further increased his carvedilol to 18.75 mg twice a day.  Laboratory had revealed a magnesium of 2.0.  He has stage III chronic kidney  Patient ID: Martin Cordova, male   DOB: January 27, 1941, 81 y.o.   MRN: [id number]       HPI: Martin Cordova is a 82 y.o. male is who presents to the office today for a 2 month follow-up cardiology clinic evaluation.  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 In the past, he developed an obstructive uropathy attributed to kidney stones; creatinine had risen up to 3 and ultimately improved and stabilized at approximately 1.7. Martin Cordova uses his BiPAP daily and does note good sleep.  In the past he had issues with mild peripheral edema, which ultimately improved.  He had been previously taken off his lisinopril when his creatinine had risen in the setting of his obstructive uropathy.  In 2015 he developed recurrent episodes of palpitations and was found to have recurrent paroxysmal atrial fibrillation/flutter.  His medications were adjusted and  he was started on antiarrhythmic therapy with Rythmol to take in addition to his increasing doses of carvedilol.  He was started on Eliquis for anticoagulation.  He underwent successful cardioversion on 08/05/2013.  Since that time, he is unaware of any recurrent atrial fibrillation.  He has noticed more energy. He had been maintained on 50 mg twice a day of carvedilol in addition to Rythmol 225 mg every 8 hours, but due to slow pulse rates, these doses have ultimately been reduced.  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 He has been on CPAP therapy initially for approximately 8 years and has been on BiPAP auto SV for over 4 years. He admits to using his BiPAP with 100% compliance.  He obtained a new BiPAP machine in June.  He feels that his machine is  significantly improved from his prior one. When I last saw him I reviewed his  download of his new BiPAP auto SV machine indicates that 90% of the time is EPAP pressure was 17 and 90% of the time his pressure support was 5.8, giving an IPAP of 21.8 cm.  He is using it 100% of the time and is averaging 5 hours and 49 minutes on his most recent download from 12/07/2013 through 01/05/2014.  His average AHI was 7.5.  His device setting maximum BiPAP pressure is 25 cm in maximum EPAP pressure 21 cm.  His average central event index is 1.3, and average obstructive apneic index 0.2, with an average copy index of 6.0.  Average breath.  Rate is 18 breaths per minute with a minute ventilation of 12.1.  Average total body may 670 mL.  When I saw him on 10/21/15 he was unaware of any arrhythmia.  He was on carvedilol 12.5 mg twice a day, Rythmol 225 mg twice a day, hydralazine 25 mg twice a day and eliquis  5 mg twice a day.  He also has been taking omeprazole for GERD and atorvastatin 10 mg for hyperlipidemia. He was found to have recurrent atrial flutter with variable block.  At that time, I recommended that he increase his Rythmol and take 225 mg every 8 hours and further increased his carvedilol to 18.75 mg twice a day.  Laboratory had revealed a magnesium of 2.0.  He has stage III chronic kidney

## 2023-02-23 ENCOUNTER — Encounter: Payer: Self-pay | Admitting: Cardiovascular Disease

## 2023-02-23 ENCOUNTER — Ambulatory Visit: Payer: Medicare PPO | Attending: Cardiovascular Disease | Admitting: Cardiovascular Disease

## 2023-02-23 DIAGNOSIS — Z7901 Long term (current) use of anticoagulants: Secondary | ICD-10-CM

## 2023-02-23 DIAGNOSIS — G4733 Obstructive sleep apnea (adult) (pediatric): Secondary | ICD-10-CM

## 2023-02-23 DIAGNOSIS — I10 Essential (primary) hypertension: Secondary | ICD-10-CM

## 2023-02-23 DIAGNOSIS — E213 Hyperparathyroidism, unspecified: Secondary | ICD-10-CM

## 2023-02-23 DIAGNOSIS — G4731 Primary central sleep apnea: Secondary | ICD-10-CM | POA: Diagnosis not present

## 2023-02-23 DIAGNOSIS — N184 Chronic kidney disease, stage 4 (severe): Secondary | ICD-10-CM

## 2023-02-23 DIAGNOSIS — I480 Paroxysmal atrial fibrillation: Secondary | ICD-10-CM | POA: Diagnosis not present

## 2023-02-23 DIAGNOSIS — I4819 Other persistent atrial fibrillation: Secondary | ICD-10-CM

## 2023-02-23 DIAGNOSIS — E1122 Type 2 diabetes mellitus with diabetic chronic kidney disease: Secondary | ICD-10-CM

## 2023-02-23 DIAGNOSIS — E785 Hyperlipidemia, unspecified: Secondary | ICD-10-CM | POA: Diagnosis not present

## 2023-02-23 MED ORDER — AMIODARONE HCL 200 MG PO TABS: 200.0000 mg | ORAL_TABLET | Freq: Two times a day (BID) | ORAL | 3 refills | Status: DC

## 2023-02-23 NOTE — Patient Instructions (Signed)
Medication Instructions:  INCREASE THE AMIODARONE 200 MG TO 400 MG  DAILY. TAKE TWO (2) PILLS DAILY *If you need a refill on your cardiac medications before your next appointment, please call your pharmacy*   Lab Work: NONE If you have labs (blood work) drawn today and your tests are completely normal, you will receive your results only by: MyChart Message (if you have MyChart) OR A paper copy in the mail If you have any lab test that is abnormal or we need to change your treatment, we will call you to review the results.   Testing/Procedures: NONE   Follow-Up: At [facility], you and your health needs are our priority.  As part of our continuing mission to provide you with exceptional heart care, we have created designated Provider Care Teams.  These Care Teams include your primary Cardiologist (physician) and Advanced Practice Providers (APPs -  Physician Assistants and Nurse Practitioners) who all work together to provide you with the care you need, when you need it.  We recommend signing up for the patient portal called "MyChart".  Sign up information is provided on this After Visit Summary.  MyChart is used to connect with patients for Virtual Visits (Telemedicine).  Patients are able to view lab/test results, encounter notes, upcoming appointments, etc.  Non-urgent messages can be sent to your provider as well.   To learn more about what you can do with MyChart, go to [email or web address].    Your next appointment:   3 month(s) WITH DR. Tresa Endo  Provider:   Sherryl Manges, MD or  DR. Ladona Ridgel

## 2023-02-25 ENCOUNTER — Encounter: Payer: Self-pay | Admitting: Cardiovascular Disease

## 2023-02-25 NOTE — Addendum Note (Signed)
Addended by: Nicki Guadalajara A on: 02/25/2023 03:27 PM   Modules accepted: Level of Service

## 2023-02-28 DIAGNOSIS — M25512 Pain in left shoulder: Secondary | ICD-10-CM | POA: Diagnosis not present

## 2023-03-06 ENCOUNTER — Other Ambulatory Visit (HOSPITAL_COMMUNITY): Payer: Self-pay | Admitting: Internal Medicine

## 2023-03-06 DIAGNOSIS — E210 Primary hyperparathyroidism: Secondary | ICD-10-CM

## 2023-03-20 DIAGNOSIS — G4733 Obstructive sleep apnea (adult) (pediatric): Secondary | ICD-10-CM | POA: Diagnosis not present

## 2023-03-21 ENCOUNTER — Ambulatory Visit (HOSPITAL_COMMUNITY)
Admission: RE | Admit: 2023-03-21 | Discharge: 2023-03-21 | Disposition: A | Discharge status: Home / Self Care | Payer: Medicare PPO | Source: Ambulatory Visit | Attending: Internal Medicine | Admitting: Internal Medicine

## 2023-03-21 DIAGNOSIS — D351 Benign neoplasm of parathyroid gland: Secondary | ICD-10-CM | POA: Diagnosis not present

## 2023-03-21 DIAGNOSIS — E210 Primary hyperparathyroidism: Secondary | ICD-10-CM | POA: Insufficient documentation

## 2023-03-21 MED ORDER — TECHNETIUM TC 99M SESTAMIBI - CARDIOLITE
24.8000 | Freq: Once | INTRAVENOUS | Status: AC | PRN
  Administered 2023-03-21: 24.8 via INTRAVENOUS

## 2023-03-22 DIAGNOSIS — G4733 Obstructive sleep apnea (adult) (pediatric): Secondary | ICD-10-CM | POA: Diagnosis not present

## 2023-03-25 ENCOUNTER — Other Ambulatory Visit: Payer: Self-pay | Admitting: Cardiovascular Disease

## 2023-03-28 ENCOUNTER — Encounter: Payer: Self-pay | Admitting: Cardiovascular Disease

## 2023-03-28 ENCOUNTER — Other Ambulatory Visit: Payer: Self-pay

## 2023-03-28 MED ORDER — CARVEDILOL 12.5 MG PO TABS: ORAL_TABLET | ORAL | 3 refills | Status: DC

## 2023-03-28 NOTE — Telephone Encounter (Signed)
Spoke to patient's wife Carvedilol refill sent to [facility].

## 2023-03-28 NOTE — Telephone Encounter (Deleted)
Spoke

## 2023-04-05 DIAGNOSIS — H26493 Other secondary cataract, bilateral: Secondary | ICD-10-CM | POA: Diagnosis not present

## 2023-04-05 DIAGNOSIS — H40023 Open angle with borderline findings, high risk, bilateral: Secondary | ICD-10-CM | POA: Diagnosis not present

## 2023-04-05 DIAGNOSIS — E119 Type 2 diabetes mellitus without complications: Secondary | ICD-10-CM | POA: Diagnosis not present

## 2023-04-05 LAB — HM DIABETES EYE EXAM

## 2023-04-06 ENCOUNTER — Telehealth: Payer: Self-pay

## 2023-04-06 DIAGNOSIS — E210 Primary hyperparathyroidism: Secondary | ICD-10-CM | POA: Diagnosis not present

## 2023-04-06 NOTE — Telephone Encounter (Signed)
Pre-operative Risk Assessment    Patient Name: Martin Cordova  DOB: 04-26-1941 MRN: [id number]   Last ov:02/23/2023  Upcoming visit:04/11/2023     Request for Surgical Clearance    Procedure:   Parathyroidectomy   Date of Surgery:  Clearance TBD                                 Surgeon:  Darnell Level, MD  Surgeon's Group or Practice Name:  [facility]  Phone number:  [phone] Fax number:  [phone]   Type of Clearance Requested:   - Medical  - Pharmacy:  Hold Apixaban (Eliquis) Not indicated    Type of Anesthesia:  General    Additional requests/questions:    Vance Peper   04/06/2023, 4:50 PM

## 2023-04-07 ENCOUNTER — Other Ambulatory Visit: Payer: Self-pay | Admitting: Surgery

## 2023-04-07 DIAGNOSIS — E0590 Thyrotoxicosis, unspecified without thyrotoxic crisis or storm: Secondary | ICD-10-CM

## 2023-04-07 NOTE — Telephone Encounter (Signed)
Please advise holding Eliquis prior to parathyroidectomy.   Thank you!  DW

## 2023-04-10 ENCOUNTER — Other Ambulatory Visit: Payer: Medicare PPO

## 2023-04-11 ENCOUNTER — Telehealth: Payer: Self-pay

## 2023-04-11 ENCOUNTER — Ambulatory Visit: Payer: Medicare PPO | Admitting: Cardiology

## 2023-04-11 NOTE — Telephone Encounter (Signed)
Patient has now been scheduled for preop clearance appt, med rec and consent given,

## 2023-04-11 NOTE — Telephone Encounter (Signed)
   Name: Martin Cordova  DOB: 07-06-1940  MRN: [id number]  Primary Cardiologist: Nicki Guadalajara, MD   Preoperative team, please contact this patient and set up a phone call appointment for further preoperative risk assessment. Please obtain consent and complete medication review. Thank you for your help.  I confirm that guidance regarding antiplatelet and oral anticoagulation therapy has been completed and, if necessary, noted below.  Per Pharm D, patient may hold Eliquis for 3 days prior to procedure.    I also confirmed the patient resides in the state of [place]. As per [facility] Medical Board telemedicine laws, the patient must reside in the state in which the provider is licensed.   Carlos Levering, NP 04/11/2023, 12:08 PM [facility]

## 2023-04-11 NOTE — Telephone Encounter (Signed)
Patient with diagnosis of afib on Eliquis for anticoagulation.    Procedure: Parathyroidectomy  Date of procedure: TBD   CHA2DS2-VASc Score = 4   This indicates a 4.8% annual risk of stroke. The patient's score is based upon: CHF History: 0 HTN History: 1 Diabetes History: 1 Stroke History: 0 Vascular Disease History: 0 Age Score: 2 Gender Score: 0      CrCl 24 ml/min Platelet count 123  Per office protocol, patient can hold Eliquis for 3 days prior to procedure.    **This guidance is not considered finalized until pre-operative APP has relayed final recommendations.**

## 2023-04-11 NOTE — Telephone Encounter (Signed)
  Patient Consent for Virtual Visit        Martin Cordova has provided verbal consent on 04/11/2023 for a virtual visit (video or telephone).   CONSENT FOR VIRTUAL VISIT FOR:  Martin Cordova  By participating in this virtual visit I agree to the following:  I hereby voluntarily request, consent and authorize [facility] and its employed or contracted physicians, physician assistants, nurse practitioners or other licensed health care professionals (the Practitioner), to provide me with telemedicine health care services (the "Services") as deemed necessary by the treating Practitioner. I acknowledge and consent to receive the Services by the Practitioner via telemedicine. I understand that the telemedicine visit will involve communicating with the Practitioner through live audiovisual communication technology and the disclosure of certain medical information by electronic transmission. I acknowledge that I have been given the opportunity to request an in-person assessment or other available alternative prior to the telemedicine visit and am voluntarily participating in the telemedicine visit.  I understand that I have the right to withhold or withdraw my consent to the use of telemedicine in the course of my care at any time, without affecting my right to future care or treatment, and that the Practitioner or I may terminate the telemedicine visit at any time. I understand that I have the right to inspect all information obtained and/or recorded in the course of the telemedicine visit and may receive copies of available information for a reasonable fee.  I understand that some of the potential risks of receiving the Services via telemedicine include:  Delay or interruption in medical evaluation due to technological equipment failure or disruption; Information transmitted may not be sufficient (e.g. poor resolution of images) to allow for appropriate medical decision making by the  Practitioner; and/or  In rare instances, security protocols could fail, causing a breach of personal health information.  Furthermore, I acknowledge that it is my responsibility to provide information about my medical history, conditions and care that is complete and accurate to the best of my ability. I acknowledge that Practitioner's advice, recommendations, and/or decision may be based on factors not within their control, such as incomplete or inaccurate data provided by me or distortions of diagnostic images or specimens that may result from electronic transmissions. I understand that the practice of medicine is not an exact science and that Practitioner makes no warranties or guarantees regarding treatment outcomes. I acknowledge that a copy of this consent can be made available to me via my patient portal [facility] MyChart), or I can request a printed copy by calling the office of [facility].    I understand that my insurance will be billed for this visit.   I have read or had this consent read to me. I understand the contents of this consent, which adequately explains the benefits and risks of the Services being provided via telemedicine.  I have been provided ample opportunity to ask questions regarding this consent and the Services and have had my questions answered to my satisfaction. I give my informed consent for the services to be provided through the use of telemedicine in my medical care

## 2023-04-12 ENCOUNTER — Ambulatory Visit
Admission: RE | Admit: 2023-04-12 | Discharge: 2023-04-12 | Disposition: A | Discharge status: Home / Self Care | Payer: Medicare PPO | Source: Ambulatory Visit | Attending: Family Medicine | Admitting: Family Medicine

## 2023-04-12 VITALS — BP 118/65 | HR 70 | Temp 98.7°F | Resp 18

## 2023-04-12 DIAGNOSIS — J069 Acute upper respiratory infection, unspecified: Secondary | ICD-10-CM

## 2023-04-12 MED ORDER — AMOXICILLIN-POT CLAVULANATE 875-125 MG PO TABS: 1.0000 | ORAL_TABLET | Freq: Two times a day (BID) | ORAL | 0 refills | Status: DC

## 2023-04-12 MED ORDER — PROMETHAZINE-DM 6.25-15 MG/5ML PO SYRP: 5.0000 mL | ORAL_SOLUTION | Freq: Four times a day (QID) | ORAL | 0 refills | Status: DC | PRN

## 2023-04-12 NOTE — ED Triage Notes (Signed)
Pt c/o cough, head congestion, chest congestion x 6 days, cough is productive. Pt has taken 3 home COVID test and they are negative.

## 2023-04-12 NOTE — ED Provider Notes (Signed)
[facility]    CSN: [id number] Arrival date & time: 04/12/23  0947      History   Chief Complaint Chief Complaint  Patient presents with   Cough    Upper Respiratory  - Cough, head and chest congestion - Entered by patient    HPI Martin Cordova is a 82 y.o. male.   Presenting today with about a week of progressively worsening cough, congestion, facial pain and pressure, fatigue, sore throat.  Denies chest pain, significant shortness of breath, abdominal pain, nausea vomiting or diarrhea.  So far trying numerous over-the-counter cold and congestion medications and Mucinex with minimal relief.  3 home COVID test negative.  History of seasonal allergies on antihistamines.    Past Medical History:  Diagnosis Date   Allergy    Arthritis    Cataract    Clotting disorder (HCC)    Colon polyps    Diabetes mellitus without complication (HCC)    Diverticulitis    Diverticulosis    GERD 12/07/2006   HYPERGLYCEMIA 12/07/2006   HYPERLIPIDEMIA 12/07/2006   mixed wth an atherogenic dyslipidemic pattern   HYPERTENSION 12/07/2006   Nephrolithiasis    Nonalcoholic fatty liver disease    Normal coronary arteries 04/06/2007   by cath EF 55%., last ECHO 10/13/10 EF>55% mild MR, last nuc 06/16/10 EF 57% low risk scan   OBESITY 11/10/2009   OSA treated with BiPAP 10/22/2007   PAF (paroxysmal atrial fibrillation) (HCC) 04/09/2013   Palpitations    tachy   Pulmonary HTN (HCC)    RV syst.  40-12mmHg- moderate by echo   Renal insufficiency    Cr to 3 on ACE   Screening for prostate cancer 01/11/2023   Sleep apnea    SOB (shortness of breath)    with exertion   THROMBOCYTOPENIA 12/07/2006   TRANSAMINASES, SERUM, ELEVATED 12/07/2006   VENTRICULAR TACHYCARDIA 03/27/2007   monitored by dr. Tresa Endo    Patient Active Problem List   Diagnosis Date Noted   Screening for prostate cancer 01/11/2023   Chronic kidney disease, stage 3b (HCC) 07/14/2021   Polycystic kidney disease  07/14/2021   Pain due to onychomycosis of toenails of both feet 10/02/2020   Palpitations    Nonalcoholic fatty liver disease    Nephrolithiasis    Diverticulosis    Diverticulitis    Diabetes mellitus without complication (HCC)    Colon polyps    Thrombocytopenia (HCC) 11/16/2015   Anticoagulation adequate 10/23/2015   Trigeminy 05/14/2014   Hyperlipidemia LDL goal <70 04/07/2014   Essential hypertension 04/07/2014   Type 2 diabetes mellitus (HCC) 12/02/2013   History of colonic polyps 11/08/2013   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/08/2013   Atrial flutter (HCC) 07/29/2013   PAF (paroxysmal atrial fibrillation) (HCC) 04/09/2013   Edema 11/05/2012   Pulmonary HTN (HCC)    Renal insufficiency 06/01/2011   Obesity 11/10/2009   OBESITY 11/10/2009   Sleep apnea 10/22/2007   OSA treated with BiPAP 10/22/2007   Normal coronary arteries 04/06/2007   VENTRICULAR TACHYCARDIA 03/27/2007   HYPERLIPIDEMIA 12/07/2006   THROMBOCYTOPENIA 12/07/2006   Essential hypertension 12/07/2006   GERD 12/07/2006   TRANSAMINASES, SERUM, ELEVATED 12/07/2006   Other specified abnormal findings of blood chemistry 12/07/2006    Past Surgical History:  Procedure Laterality Date   A-FLUTTER ABLATION N/A 04/19/2017   Procedure: A-FLUTTER ABLATION;  Surgeon: Duke Salvia, MD;  [facility] INVASIVE CV LAB;  Service: Cardiovascular;  Laterality: N/A;   CARDIAC CATHETERIZATION  04/06/2007  normal cors   CARDIOVERSION N/A 08/05/2013   Procedure: CARDIOVERSION;  Surgeon: Lennette Bihari, MD;  Location: [facility] ENDOSCOPY;  Service: Cardiovascular;  Laterality: N/A;   CARDIOVERSION N/A 12/26/2016   Procedure: CARDIOVERSION;  Surgeon: Lennette Bihari, MD;  Location: [facility] ENDOSCOPY;  Service: Cardiovascular;  Laterality: N/A;   CHOLECYSTECTOMY     CYSTOSCOPY/RETROGRADE/URETEROSCOPY  08/19/2011   Procedure: CYSTOSCOPY/RETROGRADE/URETEROSCOPY;  Surgeon: Lindaann Slough, MD;  Location: [facility];   Service: Urology;  Laterality: Right;  JJ STENT PLACEMENT   EYE SURGERY     ROTATOR CUFF REPAIR Right        Home Medications    Prior to Admission medications   Medication Sig Start Date End Date Taking? Authorizing Provider  amoxicillin-clavulanate (AUGMENTIN) 875-125 MG tablet Take 1 tablet by mouth every 12 (twelve) hours. 04/12/23  Yes Particia Nearing, PA-C  promethazine-dextromethorphan (PROMETHAZINE-DM) 6.25-15 MG/5ML syrup Take 5 mLs by mouth 4 (four) times daily as needed. 04/12/23  Yes Particia Nearing, PA-C  amiodarone (PACERONE) 200 MG tablet Take 1 tablet (200 mg total) by mouth 2 (two) times daily. 02/23/23   Lennette Bihari, MD  apixaban (ELIQUIS) 2.5 MG TABS tablet Take 1 tablet (2.5 mg total) by mouth 2 (two) times daily. 01/23/23   Lennette Bihari, MD  atorvastatin (LIPITOR) 10 MG tablet Take 1 tablet (10 mg total) by mouth daily with breakfast. 09/23/22   Lennette Bihari, MD  carvedilol (COREG) 12.5 MG tablet Take 1 tablet ( 12.5 mg ) in morning and 1/2 tablet ( 6.25 mg ) in pm 03/28/23   Lennette Bihari, MD  empagliflozin (JARDIANCE) 10 MG TABS tablet Take 10 mg by mouth daily.    [provider]  fluticasone (FLONASE) 50 MCG/ACT nasal spray Place 2 sprays into both nostrils daily. 09/28/21   Donita Brooks, MD  furosemide (LASIX) 40 MG tablet Alternate between taking 40mg  (1 tablet) a day and 20mg  (1/2 tablet) a day. 08/02/22   Lennette Bihari, MD  hydrALAZINE (APRESOLINE) 25 MG tablet Take 1.5 tablets (37.5 mg total) by mouth 2 (two) times daily. 09/01/22 08/27/23  Lennette Bihari, MD  omega-3 acid ethyl esters (LOVAZA) 1 g capsule TAKE 2 CAPSULES BY MOUTH ONCE DAILY. 08/02/22   Lennette Bihari, MD  OZEMPIC, 0.25 OR 0.5 MG/DOSE, 2 MG/3ML SOPN 0.25 mg for first four weeks then 0.5 mg Subcutaneous Once a week for 90 days 11/11/22   [provider]    Family History Family History  Problem Relation Age of Onset   Diabetes Mother    Heart failure  Mother    Stroke Mother    Kidney failure Father    Diabetes Father    Cancer Paternal Uncle    Colon cancer Neg Hx    Esophageal cancer Neg Hx    Stomach cancer Neg Hx    Rectal cancer Neg Hx     Social History Social History   Tobacco Use   Smoking status: Former    Current packs/day: 0.00    Average packs/day: 0.3 packs/day for 10.0 years (2.5 ttl pk-yrs)    Types: Cigarettes    Start date: 08/05/1970    Quit date: 08/04/1980    Years since quitting: 42.7   Smokeless tobacco: Former    Types: Chew    Quit date: 08/08/2004  Vaping Use   Vaping status: Never Used  Substance Use Topics   Alcohol use: Not Currently   Drug use: No  Allergies   Lisinopril, Dulaglutide, and Ibuprofen   Review of Systems Review of Systems Per HPI  Physical Exam Triage Vital Signs ED Triage Vitals  Encounter Vitals Group     BP 04/12/23 1001 118/65     Systolic BP Percentile --      Diastolic BP Percentile --      Pulse Rate 04/12/23 1001 70     Resp 04/12/23 1001 18     Temp 04/12/23 1001 98.7 F (37.1 C)     Temp Source 04/12/23 1001 Oral     SpO2 04/12/23 1001 91 %     Weight --      Height --      Head Circumference --      Peak Flow --      Pain Score 04/12/23 1004 0     Pain Loc --      Pain Education --      Exclude from Growth Chart --    No data found.  Updated Vital Signs BP 118/65 (BP Location: Right Arm)   Pulse 70   Temp 98.7 F (37.1 C) (Oral)   Resp 18   SpO2 91%   Visual Acuity Right Eye Distance:   Left Eye Distance:   Bilateral Distance:    Right Eye Near:   Left Eye Near:    Bilateral Near:     Physical Exam Vitals and nursing note reviewed.  Constitutional:      Appearance: He is well-developed.  HENT:     Head: Atraumatic.     Right Ear: External ear normal.     Left Ear: External ear normal.     Nose: Congestion present.     Mouth/Throat:     Mouth: Mucous membranes are moist.     Pharynx: Oropharynx is clear. Posterior  oropharyngeal erythema present. No oropharyngeal exudate.  Eyes:     Conjunctiva/sclera: Conjunctivae normal.     Pupils: Pupils are equal, round, and reactive to light.  Cardiovascular:     Rate and Rhythm: Normal rate and regular rhythm.  Pulmonary:     Effort: Pulmonary effort is normal. No respiratory distress.     Breath sounds: No wheezing or rales.  Musculoskeletal:        General: Normal range of motion.     Cervical back: Normal range of motion and neck supple.  Lymphadenopathy:     Cervical: No cervical adenopathy.  Skin:    General: Skin is warm and dry.  Neurological:     Mental Status: He is alert and oriented to person, place, and time.     Motor: No weakness.     Gait: Gait normal.  Psychiatric:        Behavior: Behavior normal.      UC Treatments / Results  Labs (all labs ordered are listed, but only abnormal results are displayed) Labs Reviewed - No data to display  EKG   Radiology No results found.  Procedures Procedures (including critical care time)  Medications Ordered in UC Medications - No data to display  Initial Impression / Assessment and Plan / UC Course  I have reviewed the triage vital signs and the nursing notes.  Pertinent labs & imaging results that were available during my care of the patient were reviewed by me and considered in my medical decision making (see chart for details).     Given duration worsening course, will cover with Augmentin, Phenergan DM and discussed Mucinex and other supportive measures over-the-counter.  Return precautions reviewed.  Final Clinical Impressions(s) / UC Diagnoses   Final diagnoses:  Upper respiratory tract infection, unspecified type   Discharge Instructions   None    ED Prescriptions     Medication Sig Dispense Auth. Provider   amoxicillin-clavulanate (AUGMENTIN) 875-125 MG tablet Take 1 tablet by mouth every 12 (twelve) hours. 14 tablet Particia Nearing, [place]    promethazine-dextromethorphan (PROMETHAZINE-DM) 6.25-15 MG/5ML syrup Take 5 mLs by mouth 4 (four) times daily as needed. 100 mL Particia Nearing, [place]      PDMP not reviewed this encounter.   Particia Nearing, [place] 04/12/23 1021

## 2023-04-17 ENCOUNTER — Ambulatory Visit
Admission: RE | Admit: 2023-04-17 | Discharge: 2023-04-17 | Disposition: A | Discharge status: Home / Self Care | Payer: Medicare PPO | Source: Ambulatory Visit | Attending: Surgery | Admitting: Surgery

## 2023-04-17 DIAGNOSIS — E0590 Thyrotoxicosis, unspecified without thyrotoxic crisis or storm: Secondary | ICD-10-CM

## 2023-04-17 DIAGNOSIS — E210 Primary hyperparathyroidism: Secondary | ICD-10-CM | POA: Diagnosis not present

## 2023-04-18 ENCOUNTER — Ambulatory Visit: Payer: Medicare PPO | Admitting: Family Medicine

## 2023-04-20 ENCOUNTER — Other Ambulatory Visit: Payer: Self-pay | Admitting: Internal Medicine

## 2023-04-20 DIAGNOSIS — N184 Chronic kidney disease, stage 4 (severe): Secondary | ICD-10-CM

## 2023-04-20 DIAGNOSIS — E210 Primary hyperparathyroidism: Secondary | ICD-10-CM

## 2023-04-22 DIAGNOSIS — G4733 Obstructive sleep apnea (adult) (pediatric): Secondary | ICD-10-CM | POA: Diagnosis not present

## 2023-04-27 ENCOUNTER — Ambulatory Visit: Payer: Self-pay | Admitting: Surgery

## 2023-04-27 NOTE — Progress Notes (Signed)
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  Will plan to proceed with parathyroidectomy as discussed in the office.  Will enter orders and forward to schedulers to contact patient.  Darnell Level, MD [facility] practice Office: [phone]

## 2023-04-28 ENCOUNTER — Ambulatory Visit: Payer: Medicare PPO

## 2023-04-28 ENCOUNTER — Ambulatory Visit: Payer: Medicare PPO | Admitting: Podiatry

## 2023-04-28 NOTE — Telephone Encounter (Signed)
Called patient to reschedule tele visit for today due to preop provider being out of the office. Pt is currently scheduled to see Dr. Elberta Fortis on 05/17/23 for preop clearance and also has office visit with Dr. Tresa Endo on 05/18/23

## 2023-05-01 DIAGNOSIS — L821 Other seborrheic keratosis: Secondary | ICD-10-CM | POA: Diagnosis not present

## 2023-05-01 DIAGNOSIS — Z85828 Personal history of other malignant neoplasm of skin: Secondary | ICD-10-CM | POA: Diagnosis not present

## 2023-05-01 DIAGNOSIS — B353 Tinea pedis: Secondary | ICD-10-CM | POA: Diagnosis not present

## 2023-05-01 DIAGNOSIS — L570 Actinic keratosis: Secondary | ICD-10-CM | POA: Diagnosis not present

## 2023-05-01 DIAGNOSIS — D1801 Hemangioma of skin and subcutaneous tissue: Secondary | ICD-10-CM | POA: Diagnosis not present

## 2023-05-02 ENCOUNTER — Encounter: Payer: Self-pay | Admitting: Family Medicine

## 2023-05-02 ENCOUNTER — Ambulatory Visit (HOSPITAL_COMMUNITY)
Admission: RE | Admit: 2023-05-02 | Discharge: 2023-05-02 | Disposition: A | Discharge status: Home / Self Care | Payer: Medicare PPO | Source: Ambulatory Visit | Attending: Internal Medicine | Admitting: Internal Medicine

## 2023-05-02 ENCOUNTER — Ambulatory Visit (INDEPENDENT_AMBULATORY_CARE_PROVIDER_SITE_OTHER): Payer: Medicare PPO | Admitting: Podiatry

## 2023-05-02 ENCOUNTER — Encounter: Payer: Self-pay | Admitting: Podiatry

## 2023-05-02 ENCOUNTER — Encounter: Payer: Self-pay | Admitting: Cardiovascular Disease

## 2023-05-02 VITALS — Ht 71.0 in | Wt 218.0 lb

## 2023-05-02 DIAGNOSIS — N184 Chronic kidney disease, stage 4 (severe): Secondary | ICD-10-CM | POA: Insufficient documentation

## 2023-05-02 DIAGNOSIS — M85852 Other specified disorders of bone density and structure, left thigh: Secondary | ICD-10-CM | POA: Diagnosis not present

## 2023-05-02 DIAGNOSIS — Z7901 Long term (current) use of anticoagulants: Secondary | ICD-10-CM

## 2023-05-02 DIAGNOSIS — E119 Type 2 diabetes mellitus without complications: Secondary | ICD-10-CM | POA: Diagnosis not present

## 2023-05-02 DIAGNOSIS — M79674 Pain in right toe(s): Secondary | ICD-10-CM

## 2023-05-02 DIAGNOSIS — M79675 Pain in left toe(s): Secondary | ICD-10-CM

## 2023-05-02 DIAGNOSIS — B351 Tinea unguium: Secondary | ICD-10-CM | POA: Diagnosis not present

## 2023-05-02 DIAGNOSIS — E210 Primary hyperparathyroidism: Secondary | ICD-10-CM | POA: Insufficient documentation

## 2023-05-02 NOTE — Progress Notes (Signed)
This patient returns to my office for at risk foot care.  This patient requires this care by a professional since this patient will be at risk due to having diabetes and renal insufficiency and thrombocytopenia..  Patient is taking eliquis.  This patient is unable to cut nails himself since the patient cannot reach his nails.These nails are painful walking and wearing shoes.  This patient presents for at risk foot care today.  General Appearance  Alert, conversant and in no acute stress.  Vascular  Dorsalis pedis and posterior tibial  pulses are palpable  bilaterally.  Capillary return is within normal limits  bilaterally. Temperature is within normal limits  bilaterally.  Neurologic  Senn-Weinstein monofilament wire test within normal limits  bilaterally. Muscle power within normal limits bilaterally.  Nails Thick disfigured discolored nails with subungual debris  from hallux to fifth toes bilaterally. No evidence of bacterial infection or drainage bilaterally.  Orthopedic  No limitations of motion  feet .  No crepitus or effusions noted.  No bony pathology or digital deformities noted.  HAV  B/L.  Skin  normotropic skin with no porokeratosis noted bilaterally.  No signs of infections or ulcers noted.     Onychomycosis  Pain in right toes  Pain in left toes  Consent was obtained for treatment procedures.   Mechanical debridement of nails 1-5  bilaterally performed with a nail nipper.  Filed with dremel without incident.    Return office visit   3 months                   Told patient to return for periodic foot care and evaluation due to potential at risk complications.   Helane Gunther DPM

## 2023-05-02 NOTE — Telephone Encounter (Signed)
Received clearance update that the scheduled date for patient's procedure is 05/19/23.

## 2023-05-03 NOTE — Telephone Encounter (Signed)
Preoperative team, please add preoperative cardiac evaluation to appointment notes.  Thank you for your help.  I will remove patient from preoperative pool.  Thomasene Ripple. Sheneika Walstad NP-C     05/03/2023, 10:53 AM [facility] [address] Office [phone] Fax [phone]

## 2023-05-03 NOTE — Telephone Encounter (Signed)
Appointment notes have been updated

## 2023-05-08 ENCOUNTER — Encounter: Payer: Self-pay | Admitting: Surgery

## 2023-05-08 DIAGNOSIS — E210 Primary hyperparathyroidism: Secondary | ICD-10-CM | POA: Diagnosis present

## 2023-05-08 NOTE — H&P (Signed)
REFERRING PHYSICIAN: Tonita Cong, MD  PROVIDER: [place]ah Buckalew Myra Rude, MD   Chief Complaint: New Consultation (Primary hyperparathyroidism)  History of Present Illness:  Is referred by Dr. Debara Pickett for surgical evaluation and management of newly diagnosed primary hyperparathyroidism. Patient's primary care physician is Dr. Lynnea Ferrier. Patient's nephrologist is Dr. Sabra Heck. Patient's cardiologist is Dr. Nicki Guadalajara. Patient was noted on routine laboratory studies to have an elevated serum calcium level. His most recent level was 11.0. Intact PTH levels are also elevated with a recent level of 143. 24-hour urine collection for calcium was normal at 107. Patient was referred for nuclear medicine parathyroid scanning on March 21, 2023. This localized a left inferior parathyroid adenoma. No other imaging studies have been performed. Patient notes fatigue. He notes bone and joint discomfort. Patient is being scheduled to undergo a bone density scan. He has a distant history of nephrolithiasis. There is no family history of parathyroid disease. Patient is accompanied today by his wife who is previously undergone parathyroidectomy by me in the past.  Review of Systems: A complete review of systems was obtained from the patient. I have reviewed this information and discussed as appropriate with the patient. See HPI as well for other ROS.  Review of Systems  Constitutional: Positive for malaise/fatigue.  HENT: Negative.  Eyes: Negative.  Respiratory: Negative.  Cardiovascular: Positive for palpitations.  Gastrointestinal: Negative.  Genitourinary: Negative.  Musculoskeletal: Positive for joint pain.  Skin: Negative.  Neurological: Negative.  Endo/Heme/Allergies: Negative.  Psychiatric/Behavioral: Negative.    Medical History: Past Medical History:  Diagnosis Date  Chronic kidney disease  Diabetes mellitus without complication (CMS/HHS-HCC)  GERD (gastroesophageal reflux  disease)  Hypertension  Sleep apnea   Patient Active Problem List  Diagnosis  Atrial flutter (CMS/HHS-HCC)  Chronic kidney disease, stage 3b (CMS/HHS-HCC)  Diabetes mellitus without complication (CMS/HHS-HCC)  Diverticulitis  Esophageal reflux  Essential hypertension  Hyperlipidemia, unspecified  OSA treated with BiPAP  PAF (paroxysmal atrial fibrillation) (CMS/HHS-HCC)  Palpitations  Paroxysmal ventricular tachycardia (CMS/HHS-HCC)  Polycystic kidney disease  Primary hyperparathyroidism (CMS/HHS-HCC)   History reviewed. No pertinent surgical history.   Allergies  Allergen Reactions  Lisinopril Other (See Comments) and Swelling  Renal insufficiency also  Dulaglutide Other (See Comments)  Ibuprofen Other (See Comments)  Per the patient, he has "kidney and liver issues" and is NOT suppose to take this   Current Outpatient Medications on File Prior to Visit  Medication Sig Dispense Refill  AMIOdarone (PACERONE) 200 MG tablet Take 200 mg by mouth 2 (two) times daily  apixaban (ELIQUIS) 2.5 mg tablet Take 2.5 mg by mouth 2 (two) times daily  atorvastatin (LIPITOR) 10 MG tablet Take 10 mg by mouth at bedtime  carvediloL (COREG) 12.5 MG tablet Take 1 tablet ( 12.5 mg ) in morning and 1/2 tablet ( 6.25 mg ) in pm  empagliflozin (JARDIANCE) 10 mg tablet Take 10 mg by mouth once daily  fluticasone propionate (FLONASE) 50 mcg/actuation nasal spray Place 2 sprays into one nostril once daily  FUROsemide (LASIX) 40 MG tablet Alternate between taking 40mg  (1 tablet) a day and 20mg  (1/2 tablet) a day.  hydrALAZINE (APRESOLINE) 25 MG tablet TAKE (1 & 1/2) TABLETS BY MOUTH TWICE DAILY.  omega-3 acid ethyl esters (LOVAZA) 1 gram capsule Take 2 capsules by mouth once daily  OZEMPIC 0.25 mg or 0.5 mg (2 mg/3 mL) pen injector 0.25 mg for first four weeks then 0.5 mg Subcutaneous Once a week for 90 days  No current facility-administered medications on file prior to visit.   History reviewed.  No pertinent family history.   Social History   Tobacco Use  Smoking Status Not on file  Smokeless Tobacco Not on file    Social History   Socioeconomic History  Marital status: Married   Social Drivers of Health   Financial Resource Strain: Low Risk (12/01/2022)  Received from [facility]  Overall Financial Resource Strain (CARDIA)  Difficulty of Paying Living Expenses: Not hard at all  Food Insecurity: No Food Insecurity (12/01/2022)  Received from [facility]  Hunger Vital Sign  Worried About Running Out of Food in the Last Year: Never true  Ran Out of Food in the Last Year: Never true  Transportation Needs: No Transportation Needs (12/01/2022)  Received from [facility] - Transportation  Lack of Transportation (Medical): No  Lack of Transportation (Non-Medical): No  Physical Activity: Sufficiently Active (12/01/2022)  Received from [facility]  Exercise Vital Sign  Days of Exercise per Week: 5 days  Minutes of Exercise per Session: 30 min  Stress: No Stress Concern Present (12/01/2022)  Received from [facility] - Occupational Stress Questionnaire  Feeling of Stress : Not at all  Social Connections: Socially Integrated (12/01/2022)  Received from [facility]  Social Connection and Isolation Panel [NHANES]  Frequency of Communication with Friends and Family: More than three times a week  Frequency of Social Gatherings with Friends and Family: Three times a week  Attends Religious Services: More than 4 times per year  Active Member of Clubs or Organizations: Yes  Attends Banker Meetings: 1 to 4 times per year  Marital Status: Married   Objective:   Vitals:  BP: (!) 185/80  Pulse: 60  Temp: 36.7 C (98 F)  Weight: 97.1 kg (214 lb)  Height: 180.3 cm (5\' 11" )  PainSc: 0-No pain   Body mass index is 29.85 kg/m.  Physical Exam   GENERAL APPEARANCE Comfortable, no acute issues Development:  normal Gross deformities: none  SKIN Rash, lesions, ulcers: none Induration, erythema: none Nodules: none palpable  EYES Conjunctiva and lids: normal Pupils: equal  EARS, NOSE, MOUTH, THROAT External ears: no lesion or deformity External nose: no lesion or deformity Hearing: grossly normal  NECK Symmetric: yes Trachea: midline Thyroid: no palpable nodules in the thyroid bed  CHEST/CV Not assessed  ABDOMEN Not assessed  GENITOURINARY/RECTAL Not assessed  MUSCULOSKELETAL Station and gait: normal Digits and nails: no clubbing or cyanosis Muscle strength: grossly normal all extremities Deformity: none  LYMPHATIC Cervical: none palpable Supraclavicular: none palpable  PSYCHIATRIC Oriented to person, place, and time: yes Mood and affect: normal for situation Judgment and insight: appropriate for situation   Assessment and Plan:   Primary hyperparathyroidism (CMS/HHS-HCC)  Patient is referred by his endocrinologist, Dr. Debara Pickett, for surgical evaluation and management of newly diagnosed primary hyperparathyroidism.  Patient provided with a copy of "Parathyroid Surgery: Treatment for Your Parathyroid Gland Problem", published by [employer], 12 pages. Book reviewed and explained to patient during visit today.  They we reviewed his clinical history. We reviewed his recent laboratory studies. We reviewed his nuclear medicine parathyroid scan results. I would like to obtain an ultrasound examination of the neck in hopes of confirming the location of the parathyroid adenoma and to rule out any concurrent thyroid disease.  Also a consideration in the face of his chronic kidney disease is the possibility of secondary hyperparathyroidism. Ultrasound will be  helpful in ruling out multi gland disease. I discussed this today with the patient and his wife.  We discussed minimally invasive parathyroidectomy. We discussed the size and location of the surgical incision. We discussed  the hospital stay to be anticipated. We would plan this as an outpatient surgery. We discussed his postoperative recovery and return for follow-up. Patient is thinking that he may delay surgery until after the first of the year. I think this would be appropriate.  We will obtain the ultrasound. We will also notify his cardiologist and obtain cardiac clearance. The patient will need to interrupt his Eliquis for the procedure.  Patient will undergo the above testing. We will contact him with the results when they are available and make plans for further management at that time.   Darnell Level, MD [facility] practice Office: [phone]

## 2023-05-11 NOTE — Patient Instructions (Addendum)
SURGICAL WAITING ROOM VISITATION Patients having surgery or a procedure may have no more than 2 support people in the waiting area - these visitors may rotate.    Children under the age of 72 must have an adult with them who is not the patient.  If the patient needs to stay at the hospital during part of their recovery, the visitor guidelines for inpatient rooms apply. Pre-op nurse will coordinate an appropriate time for 1 support person to accompany patient in pre-op.  This support person may not rotate.    Please refer to the [facility] website for the visitor guidelines for Inpatients (after your surgery is over and you are in a regular room).       Your procedure is scheduled on: 05-19-23   Report to [facility] Main Entrance    Report to admitting at 11:15 AM   Call this number if you have problems the morning of surgery [phone]   Do not eat food :After Midnight.   After Midnight you may have the following liquids until 10:30 AM DAY OF SURGERY  Water Non-Citrus Juices (without pulp, NO RED-Apple, White grape, White cranberry) Black Coffee (NO MILK/CREAM OR CREAMERS, sugar ok)  Clear Tea (NO MILK/CREAM OR CREAMERS, sugar ok) regular and decaf                             Plain Jell-O (NO RED)                                           Fruit ices (not with fruit pulp, NO RED)                                     Popsicles (NO RED)                                                               Sports drinks like Gatorade (NO RED)                        If you have questions, please contact your surgeon's office.   FOLLOW  ANY ADDITIONAL PRE OP INSTRUCTIONS YOU RECEIVED FROM YOUR SURGEON'S OFFICE!!!     Oral Hygiene is also important to reduce your risk of infection.                                    Remember - BRUSH YOUR TEETH THE MORNING OF SURGERY WITH YOUR REGULAR TOOTHPASTE   Do NOT smoke after Midnight   Take these medicines the morning of surgery with A  SIP OF WATER:   Amiodarone  Atorvastatin  Carvedilol  Hydralazine  Flonase nasal spray  Stop all vitamins and herbal supplements 7 days before surgery  Hold Eliquis 2 days before surgery   How to Manage Your Diabetes Before and After Surgery  Why is it important to control my blood sugar before and after surgery? Improving blood sugar levels  before and after surgery helps healing and can limit problems. A way of improving blood sugar control is eating a healthy diet by:  Eating less sugar and carbohydrates  Increasing activity/exercise  Talking with your doctor about reaching your blood sugar goals High blood sugars (greater than 180 mg/dL) can raise your risk of infections and slow your recovery, so you will need to focus on controlling your diabetes during the weeks before surgery. Make sure that the doctor who takes care of your diabetes knows about your planned surgery including the date and location.  How do I manage my blood sugar before surgery? Check your blood sugar at least 4 times a day, starting 2 days before surgery, to make sure that the level is not too high or low. Check your blood sugar the morning of your surgery when you wake up and every 2 hours until you get to the Short Stay unit. If your blood sugar is less than 70 mg/dL, you will need to treat for low blood sugar: Do not take insulin. Treat a low blood sugar (less than 70 mg/dL) with  cup of clear juice (cranberry or apple), 4 glucose tablets, OR glucose gel. Recheck blood sugar in 15 minutes after treatment (to make sure it is greater than 70 mg/dL). If your blood sugar is not greater than 70 mg/dL on recheck, call [phone] for further instructions. Report your blood sugar to the short stay nurse when you get to Short Stay.  If you are admitted to the hospital after surgery: Your blood sugar will be checked by the staff and you will probably be given insulin after surgery (instead of oral diabetes  medicines) to make sure you have good blood sugar levels. The goal for blood sugar control after surgery is 80-180 mg/dL.   WHAT DO I DO ABOUT MY DIABETES MEDICATION?  Do not take oral diabetes medicines (pills) the morning of surgery.  Hold Ozempic 7 days before surgery (do not take after 05-11-23)  Hold Jardiance 3 days before surgery (do not take after 05-15-23).  DO NOT TAKE THE FOLLOWING 7 DAYS PRIOR TO SURGERY: Ozempic, Wegovy, Rybelsus (Semaglutide), Byetta (exenatide), Bydureon (exenatide ER), Victoza, Saxenda (liraglutide), or Trulicity (dulaglutide) Mounjaro (Tirzepatide) Adlyxin (Lixisenatide), Polyethylene Glycol Loxenatide.   Reviewed and Endorsed by [facility] Patient Education Committee, August 2015  Bring CPAP mask and tubing day of surgery.                              You may not have any metal on your body including jewelry, and body piercing             Do not wear lotions, powders, cologne, or deodorant              Men may shave face and neck.   Do not bring valuables to the hospital. [facility] IS NOT RESPONSIBLE   FOR VALUABLES.   Contacts, dentures or bridgework may not be worn into surgery.   DO NOT BRING YOUR HOME MEDICATIONS TO THE HOSPITAL. PHARMACY WILL DISPENSE MEDICATIONS LISTED ON YOUR MEDICATION LIST TO YOU DURING YOUR ADMISSION IN THE HOSPITAL!    Patients discharged on the day of surgery will not be allowed to drive home.  Someone NEEDS to stay with you for the first 24 hours after anesthesia.              Please read over the following fact sheets you  were given: IF YOU HAVE QUESTIONS ABOUT YOUR PRE-OP INSTRUCTIONS PLEASE CALL [phone] [place]  If you received a COVID test during your pre-op visit  it is requested that you wear a mask when out in public, stay away from anyone that may not be feeling well and notify your surgeon if you develop symptoms. If you test positive for Covid or have been in contact with anyone that has tested positive  in the last 10 days please notify you surgeon.  [facility] - Preparing for Surgery Before surgery, you can play an important role.  Because skin is not sterile, your skin needs to be as free of germs as possible.  You can reduce the number of germs on your skin by washing with CHG (chlorahexidine gluconate) soap before surgery.  CHG is an antiseptic cleaner which kills germs and bonds with the skin to continue killing germs even after washing. Please DO NOT use if you have an allergy to CHG or antibacterial soaps.  If your skin becomes reddened/irritated stop using the CHG and inform your nurse when you arrive at Short Stay. Do not shave (including legs and underarms) for at least 48 hours prior to the first CHG shower.  You may shave your face/neck.  Please follow these instructions carefully:  1.  Shower with CHG Soap the night before surgery and the  morning of surgery.  2.  If you choose to wash your hair, wash your hair first as usual with your normal  shampoo.  3.  After you shampoo, rinse your hair and body thoroughly to remove the shampoo.                             4.  Use CHG as you would any other liquid soap.  You can apply chg directly to the skin and wash.  Gently with a scrungie or clean washcloth.  5.  Apply the CHG Soap to your body ONLY FROM THE NECK DOWN.   Do   not use on face/ open                           Wound or open sores. Avoid contact with eyes, ears mouth and   genitals (private parts).                       Wash face,  Genitals (private parts) with your normal soap.             6.  Wash thoroughly, paying special attention to the area where your    surgery  will be performed.  7.  Thoroughly rinse your body with warm water from the neck down.  8.  DO NOT shower/wash with your normal soap after using and rinsing off the CHG Soap.                9.  Pat yourself dry with a clean towel.            10.  Wear clean pajamas.            11.  Place clean sheets on your bed  the night of your first shower and do not  sleep with pets. Day of Surgery : Do not apply any lotions/deodorants the morning of surgery.  Please wear clean clothes to the hospital/surgery center.  FAILURE TO FOLLOW THESE INSTRUCTIONS MAY RESULT IN THE  CANCELLATION OF YOUR SURGERY  PATIENT SIGNATURE_________________________________  NURSE SIGNATURE__________________________________  ________________________________________________________________________

## 2023-05-12 DIAGNOSIS — Z87442 Personal history of urinary calculi: Secondary | ICD-10-CM | POA: Diagnosis not present

## 2023-05-12 DIAGNOSIS — N184 Chronic kidney disease, stage 4 (severe): Secondary | ICD-10-CM | POA: Diagnosis not present

## 2023-05-12 DIAGNOSIS — E669 Obesity, unspecified: Secondary | ICD-10-CM | POA: Diagnosis not present

## 2023-05-12 DIAGNOSIS — E210 Primary hyperparathyroidism: Secondary | ICD-10-CM | POA: Diagnosis not present

## 2023-05-12 DIAGNOSIS — E119 Type 2 diabetes mellitus without complications: Secondary | ICD-10-CM | POA: Diagnosis not present

## 2023-05-12 DIAGNOSIS — Q613 Polycystic kidney, unspecified: Secondary | ICD-10-CM | POA: Diagnosis not present

## 2023-05-12 NOTE — Progress Notes (Signed)
COVID Vaccine Completed:  Yes  Date of COVID positive in last 90 days:  PCP - Lynnea Ferrier, MD Cardiologist - Nicki Guadalajara, MD (scheduled for clearance visit 05-17-23)  Chest x-ray -  EKG - 02-23-23 Epic Stress Test - 06-16-10 Epic ECHO - 12-23-20 Epic Cardiac Cath -  Pacemaker/ICD device last checked: Spinal Cord Stimulator: MR Cardiac - 07-21-17 Epic Aflutter albation - 04-19-17 Epic  Bowel Prep -   Sleep Study - Yes, +sleep apnea CPAP - Bipap  Fasting Blood Sugar -  Checks Blood Sugar _____ times a day  Ozempic Last dose of GLP1 agonist-  05-11-23 GLP1 instructions:  Hold 7 days before surgery    Jardiance Last dose of SGLT-2 inhibitors-  05-15-23 SGLT-2 instructions:  Hold 3 days before surgery    Blood Thinner Instructions:  Eliquis Time Aspirin Instructions: Last Dose:  Activity level:  Can go up a flight of stairs and perform activities of daily living without stopping and without symptoms of chest pain or shortness of breath.  Able to exercise without symptoms  Unable to go up a flight of stairs without symptoms of     Anesthesia review:  Afib, HTN, DM, NASH  Patient denies shortness of breath, fever, cough and chest pain at PAT appointment  Patient verbalized understanding of instructions that were given to them at the PAT appointment. Patient was also instructed that they will need to review over the PAT instructions again at home before surgery.

## 2023-05-16 ENCOUNTER — Encounter (HOSPITAL_COMMUNITY): Payer: Self-pay

## 2023-05-16 ENCOUNTER — Other Ambulatory Visit: Payer: Self-pay

## 2023-05-16 ENCOUNTER — Encounter (HOSPITAL_COMMUNITY)
Admission: RE | Admit: 2023-05-16 | Discharge: 2023-05-16 | Disposition: A | Discharge status: Home / Self Care | Payer: Medicare PPO | Source: Ambulatory Visit | Attending: Surgery | Admitting: Surgery

## 2023-05-16 VITALS — BP 147/90 | HR 65 | Temp 98.1°F | Resp 16 | Ht 71.0 in | Wt 210.8 lb

## 2023-05-16 DIAGNOSIS — E119 Type 2 diabetes mellitus without complications: Secondary | ICD-10-CM | POA: Diagnosis not present

## 2023-05-16 DIAGNOSIS — Z01812 Encounter for preprocedural laboratory examination: Secondary | ICD-10-CM | POA: Diagnosis not present

## 2023-05-16 DIAGNOSIS — Z87891 Personal history of nicotine dependence: Secondary | ICD-10-CM | POA: Insufficient documentation

## 2023-05-16 DIAGNOSIS — E210 Primary hyperparathyroidism: Secondary | ICD-10-CM | POA: Diagnosis not present

## 2023-05-16 DIAGNOSIS — G4733 Obstructive sleep apnea (adult) (pediatric): Secondary | ICD-10-CM | POA: Diagnosis not present

## 2023-05-16 DIAGNOSIS — I10 Essential (primary) hypertension: Secondary | ICD-10-CM | POA: Diagnosis not present

## 2023-05-16 DIAGNOSIS — K769 Liver disease, unspecified: Secondary | ICD-10-CM | POA: Insufficient documentation

## 2023-05-16 DIAGNOSIS — I480 Paroxysmal atrial fibrillation: Secondary | ICD-10-CM | POA: Insufficient documentation

## 2023-05-16 DIAGNOSIS — Z01818 Encounter for other preprocedural examination: Secondary | ICD-10-CM

## 2023-05-16 HISTORY — DX: Primary hyperparathyroidism: E21.0

## 2023-05-16 HISTORY — DX: Personal history of urinary calculi: Z87.442

## 2023-05-16 LAB — CBC
HCT: 43.6 % (ref 39.0–52.0)
Hemoglobin: 14.1 g/dL (ref 13.0–17.0)
MCH: 31.8 pg (ref 26.0–34.0)
MCHC: 32.3 g/dL (ref 30.0–36.0)
MCV: 98.2 fL (ref 80.0–100.0)
Platelets: 129 10*3/uL — ABNORMAL LOW (ref 150–400)
RBC: 4.44 MIL/uL (ref 4.22–5.81)
RDW: 13.6 % (ref 11.5–15.5)
WBC: 6.3 10*3/uL (ref 4.0–10.5)
nRBC: 0 % (ref 0.0–0.2)

## 2023-05-16 LAB — COMPREHENSIVE METABOLIC PANEL
ALT: 38 U/L (ref 0–44)
AST: 26 U/L (ref 15–41)
Albumin: 3.7 g/dL (ref 3.5–5.0)
Alkaline Phosphatase: 52 U/L (ref 38–126)
Anion gap: 7 (ref 5–15)
BUN: 35 mg/dL — ABNORMAL HIGH (ref 8–23)
CO2: 26 mmol/L (ref 22–32)
Calcium: 10 mg/dL (ref 8.9–10.3)
Chloride: 107 mmol/L (ref 98–111)
Creatinine, Ser: 2.58 mg/dL — ABNORMAL HIGH (ref 0.61–1.24)
GFR, Estimated: 24 mL/min — ABNORMAL LOW (ref >60)
Glucose, Bld: 84 mg/dL (ref 70–99)
Potassium: 4 mmol/L (ref 3.5–5.1)
Sodium: 140 mmol/L (ref 135–145)
Total Bilirubin: 0.6 mg/dL (ref <1.2)
Total Protein: 6.6 g/dL (ref 6.5–8.1)

## 2023-05-16 LAB — GLUCOSE, CAPILLARY: Glucose-Capillary: 103 mg/dL — ABNORMAL HIGH (ref 70–99)

## 2023-05-16 LAB — HEMOGLOBIN A1C
Hgb A1c MFr Bld: 5.5 % (ref 4.8–5.6)
Mean Plasma Glucose: 111.15 mg/dL

## 2023-05-17 ENCOUNTER — Encounter: Payer: Self-pay | Admitting: Cardiology

## 2023-05-17 ENCOUNTER — Ambulatory Visit: Payer: Medicare PPO | Attending: Cardiology | Admitting: Cardiology

## 2023-05-17 VITALS — BP 140/70 | HR 58 | Ht 71.0 in | Wt 213.8 lb

## 2023-05-17 DIAGNOSIS — I10 Essential (primary) hypertension: Secondary | ICD-10-CM

## 2023-05-17 DIAGNOSIS — I4819 Other persistent atrial fibrillation: Secondary | ICD-10-CM | POA: Diagnosis not present

## 2023-05-17 DIAGNOSIS — I483 Typical atrial flutter: Secondary | ICD-10-CM

## 2023-05-17 DIAGNOSIS — D6869 Other thrombophilia: Secondary | ICD-10-CM

## 2023-05-17 DIAGNOSIS — G4733 Obstructive sleep apnea (adult) (pediatric): Secondary | ICD-10-CM

## 2023-05-17 NOTE — Progress Notes (Signed)
Electrophysiology Office Note:   Date:  05/17/2023  ID:  Martin Cordova, DOB 1940-08-12, MRN [id number]  Primary Cardiologist: Nicki Guadalajara, MD Primary Heart Failure: None Electrophysiologist: None      History of Present Illness:   Martin Cordova is a 82 y.o. male with h/o obesity, sleep apnea, metabolic syndrome, atrial fibrillation/flutter seen today for  for Electrophysiology evaluation of atrial fibrillation at the request of Martin Cordova.   Today, he feels well.  He has no acute symptoms.  He does not have shortness of breath, chest pain, fatigue.  He has had more episodes of atrial fibrillation.  He also has had atrial flutter and is post atrial flutter ablation by Dr. Graciela Husbands in 2018.  He has been on amiodarone for a little more than a year.  He was having more frequent episodes of atrial fibrillation, and his amiodarone was increased to 200 mg twice daily.  Since it was increased, he has had a significant reduction in his atrial fibrillation burden.  He has continued to have episodes, but the episodes are quite a bit shorter.  He feels fatigued and short of breath when he is in atrial fibrillation.  He has a shop that is approximately 200 feet from his house.  When he is in atrial fibrillation, he can walk to the shop, but has to rest when he gets there.  He would prefer an alternative rhythm control strategy.  Review of systems complete and found to be negative unless listed in HPI.   EP Information / Studies Reviewed:    EKG is ordered today. Personal review as below.  EKG Interpretation Date/Time:  Wednesday May 17 2023 15:18:35 EST Ventricular Rate:  58 PR Interval:  212 QRS Duration:  174 QT Interval:  488 QTC Calculation: 479 R Axis:   -33  Text Interpretation: Sinus bradycardia with 1st degree A-V block Left axis deviation Right bundle branch block When compared with ECG of 23-Feb-2023 08:10, Sinus rhythm has replaced Atrial fibrillation Confirmed by Chattie Greeson  ([id number]) on 05/17/2023 3:25:37 PM     Risk Assessment/Calculations:    CHA2DS2-VASc Score = 4   This indicates a 4.8% annual risk of stroke. The patient's score is based upon: CHF History: 0 HTN History: 1 Diabetes History: 1 Stroke History: 0 Vascular Disease History: 0 Age Score: 2 Gender Score: 0            Physical Exam:   VS:  BP (!) 140/70 (BP Location: Left Arm, Patient Position: Sitting, Cuff Size: Large)   Pulse (!) 58   Ht 5\' 11"  (1.803 m)   Wt 213 lb 12.8 oz (97 kg)   SpO2 97%   BMI 29.82 kg/m    Wt Readings from Last 3 Encounters:  05/17/23 213 lb 12.8 oz (97 kg)  05/16/23 210 lb 12.8 oz (95.6 kg)  05/02/23 218 lb (98.9 kg)     GEN: Well nourished, well developed in no acute distress NECK: No JVD; No carotid bruits CARDIAC: Regular rate and rhythm, no murmurs, rubs, gallops RESPIRATORY:  Clear to auscultation without rales, wheezing or rhonchi  ABDOMEN: Soft, non-tender, non-distended EXTREMITIES:  No edema; No deformity   ASSESSMENT AND PLAN:    1.  Persistent atrial fibrillation: Currently on amiodarone.  Has had a significant reduction in his atrial fibrillation burden since increased dose.  Despite this, he wishes for an alternative therapy as he is continue to have episodes of atrial fibrillation.  Jalyne Brodzinski plan for ablation.  Risk and  benefits have been discussed.  He understands the risks and is agreed to the procedure.  Risk, benefits, and alternatives to EP study and radiofrequency/pulse field ablation for afib were also discussed in detail today. These risks include but are not limited to stroke, bleeding, vascular damage, tamponade, perforation, damage to the esophagus, lungs, and other structures, pulmonary vein stenosis, worsening renal function, and death. The patient understands these risk and wishes to proceed.  We Yonis Carreon therefore proceed with catheter ablation at the next available time.  Carto, ICE, anesthesia are requested for the procedure.   Armas Mcbee also obtain CT PV protocol prior to the procedure to exclude LAA thrombus and further evaluate atrial anatomy.  2.  Typical atrial flutter: Is post ablation.  Celestine Prim recheck CTI during atrial fibrillation ablation.  3.  Hypertension: Currently well-controlled.  4.  Obstructive sleep apnea: BiPAP compliance encouraged  5.  Hyperparathyroidism: Has plans for parathyroid surgery.  He is able to exert himself without issue.  He can do his daily activities.  He would be at intermediate risk for an intermediate risk procedure.  No further cardiac testing is necessary.  6.  Secondary hypercoagulable state: Currently on Eliquis for atrial fibrillation  Follow up with Dr. Elberta Fortis as usual post procedure  Signed, Emmelyn Schmale Jorja Loa, MD

## 2023-05-17 NOTE — Patient Instructions (Signed)
Medication Instructions:  Your physician recommends that you continue on your current medications as directed. Please refer to the Current Medication list given to you today.  *If you need a refill on your cardiac medications before your next appointment, please call your pharmacy*   Lab Work: Pre procedure labs -- we will call you to schedule:  BMP & CBC  If you have a lab test that is abnormal and we need to change your treatment, we will call you to review the results -- otherwise no news is good news.    Testing/Procedures: Your physician has requested that you have cardiac CT 1 month PRIOR to your ablation. Cardiac computed tomography (CT) is a painless test that uses an x-ray machine to take clear, detailed pictures of your heart.  We will call you to schedule.  Your physician has recommended that you have an ablation. Catheter ablation is a medical procedure used to treat some cardiac arrhythmias (irregular heartbeats). During catheter ablation, a long, thin, flexible tube is put into a blood vessel in your groin (upper thigh), or neck. This tube is called an ablation catheter. It is then guided to your heart through the blood vessel. Radio frequency waves destroy small areas of heart tissue where abnormal heartbeats may cause an arrhythmia to start.   We will call you to arrange this procedure, it may be several weeks or more before hearing from [place]   Follow-Up: At [facility], you and your health needs are our priority.  As part of our continuing mission to provide you with exceptional heart care, we have created designated Provider Care Teams.  These Care Teams include your primary Cardiologist (physician) and Advanced Practice Providers (APPs -  Physician Assistants and Nurse Practitioners) who all work together to provide you with the care you need, when you need it.  Your next appointment:   1 month(s) after your ablation  The format for your next appointment:   In  Person  Provider:   [facility]   Thank you for choosing [facility]!!   Dory Horn, RN [phone]    Other Instructions   Cardiac Ablation Cardiac ablation is a procedure to destroy (ablate) some heart tissue that is sending bad signals. These bad signals cause problems in heart rhythm. The heart has many areas that make these signals. If there are problems in these areas, they can make the heart beat in a way that is not normal. Destroying some tissues can help make the heart rhythm normal. Tell your doctor about: Any allergies you have. All medicines you are taking. These include vitamins, herbs, eye drops, creams, and over-the-counter medicines. Any problems you or family members have had with medicines that make you fall asleep (anesthetics). Any blood disorders you have. Any surgeries you have had. Any medical conditions you have, such as kidney failure. Whether you are pregnant or may be pregnant. What are the risks? This is a safe procedure. But problems may occur, including: Infection. Bruising and bleeding. Bleeding into the chest. Stroke or blood clots. Damage to nearby areas of your body. Allergies to medicines or dyes. The need for a pacemaker if the normal system is damaged. Failure of the procedure to treat the problem. What happens before the procedure? Medicines Ask your doctor about: Changing or stopping your normal medicines. This is important. Taking aspirin and ibuprofen. Do not take these medicines unless your doctor tells you to take them. Taking other medicines, vitamins, herbs, and supplements. General instructions Follow  instructions from your doctor about what you cannot eat or drink. Plan to have someone take you home from the hospital or clinic. If you will be going home right after the procedure, plan to have someone with you for 24 hours. Ask your doctor what steps will be taken to prevent infection. What happens during the  procedure?  An IV tube will be put into one of your veins. You will be given a medicine to help you relax. The skin on your neck or groin will be numbed. A cut (incision) will be made in your neck or groin. A needle will be put through your cut and into a large vein. A tube (catheter) will be put into the needle. The tube will be moved to your heart. Dye may be put through the tube. This helps your doctor see your heart. Small devices (electrodes) on the tube will send out signals. A type of energy will be used to destroy some heart tissue. The tube will be taken out. Pressure will be held on your cut. This helps stop bleeding. A bandage will be put over your cut. The exact procedure may vary among doctors and hospitals. What happens after the procedure? You will be watched until you leave the hospital or clinic. This includes checking your heart rate, breathing rate, oxygen, and blood pressure. Your cut will be watched for bleeding. You will need to lie still for a few hours. Do not drive for 24 hours or as long as your doctor tells you. Summary Cardiac ablation is a procedure to destroy some heart tissue. This is done to treat heart rhythm problems. Tell your doctor about any medical conditions you may have. Tell him or her about all medicines you are taking to treat them. This is a safe procedure. But problems may occur. These include infection, bruising, bleeding, and damage to nearby areas of your body. Follow what your doctor tells you about food and drink. You may also be told to change or stop some of your medicines. After the procedure, do not drive for 24 hours or as long as your doctor tells you. This information is not intended to replace advice given to you by your health care provider. Make sure you discuss any questions you have with your health care provider. Document Revised: 08/13/2021 Document Reviewed: 04/25/2019 Elsevier Patient Education  2023 [employer]   Cardiac Ablation, Care After  This sheet gives you information about how to care for yourself after your procedure. Your health care provider may also give you more specific instructions. If you have problems or questions, contact your health care provider. What can I expect after the procedure? After the procedure, it is common to have: Bruising around your puncture site. Tenderness around your puncture site. Skipped heartbeats. If you had an atrial fibrillation ablation, you may have atrial fibrillation during the first several months after your procedure.  Tiredness (fatigue).  Follow these instructions at home: Puncture site care  Follow instructions from your health care provider about how to take care of your puncture site. Make sure you: If present, leave stitches (sutures), skin glue, or adhesive strips in place. These skin closures may need to stay in place for up to 2 weeks. If adhesive strip edges start to loosen and curl up, you may trim the loose edges. Do not remove adhesive strips completely unless your health care provider tells you to do that. If a large square bandage is present, this may be removed 24  hours after surgery.  Check your puncture site every day for signs of infection. Check for: Redness, swelling, or pain. Fluid or blood. If your puncture site starts to bleed, lie down on your back, apply firm pressure to the area, and contact your health care provider. Warmth. Pus or a bad smell. A pea or small marble sized lump at the site is normal and can take up to three months to resolve.  Driving Do not drive for at least 4 days after your procedure or however long your health care provider recommends. (Do not resume driving if you have previously been instructed not to drive for other health reasons.) Do not drive or use heavy machinery while taking prescription pain medicine. Activity Avoid activities that take a lot of effort for at least 7 days after your  procedure. Do not lift anything that is heavier than 5 lb (4.5 kg) for one week.  No sexual activity for 1 week.  Return to your normal activities as told by your health care provider. Ask your health care provider what activities are safe for you. General instructions Take over-the-counter and prescription medicines only as told by your health care provider. Do not use any products that contain nicotine or tobacco, such as cigarettes and e-cigarettes. If you need help quitting, ask your health care provider. You may shower after 24 hours, but Do not take baths, swim, or use a hot tub for 1 week.  Do not drink alcohol for 24 hours after your procedure. Keep all follow-up visits as told by your health care provider. This is important. Contact a health care provider if: You have redness, mild swelling, or pain around your puncture site. You have fluid or blood coming from your puncture site that stops after applying firm pressure to the area. Your puncture site feels warm to the touch. You have pus or a bad smell coming from your puncture site. You have a fever. You have chest pain or discomfort that spreads to your neck, jaw, or arm. You have chest pain that is worse with lying on your back or taking a deep breath. You are sweating a lot. You feel nauseous. You have a fast or irregular heartbeat. You have shortness of breath. You are dizzy or light-headed and feel the need to lie down. You have pain or numbness in the arm or leg closest to your puncture site. Get help right away if: Your puncture site suddenly swells. Your puncture site is bleeding and the bleeding does not stop after applying firm pressure to the area. These symptoms may represent a serious problem that is an emergency. Do not wait to see if the symptoms will go away. Get medical help right away. Call your local emergency services (911 in the [place] Do not drive yourself to the hospital. Summary After the procedure, it  is normal to have bruising and tenderness at the puncture site in your groin, neck, or forearm. Check your puncture site every day for signs of infection. Get help right away if your puncture site is bleeding and the bleeding does not stop after applying firm pressure to the area. This is a medical emergency. This information is not intended to replace advice given to you by your health care provider. Make sure you discuss any questions you have with your health care provider.

## 2023-05-18 ENCOUNTER — Encounter: Payer: Self-pay | Admitting: Cardiovascular Disease

## 2023-05-18 ENCOUNTER — Ambulatory Visit: Payer: Medicare PPO | Admitting: Cardiovascular Disease

## 2023-05-18 VITALS — BP 154/70 | HR 53 | Ht 71.0 in | Wt 214.2 lb

## 2023-05-18 DIAGNOSIS — E1122 Type 2 diabetes mellitus with diabetic chronic kidney disease: Secondary | ICD-10-CM

## 2023-05-18 DIAGNOSIS — N184 Chronic kidney disease, stage 4 (severe): Secondary | ICD-10-CM

## 2023-05-18 DIAGNOSIS — E782 Mixed hyperlipidemia: Secondary | ICD-10-CM | POA: Diagnosis not present

## 2023-05-18 DIAGNOSIS — I4510 Unspecified right bundle-branch block: Secondary | ICD-10-CM | POA: Diagnosis not present

## 2023-05-18 DIAGNOSIS — E213 Hyperparathyroidism, unspecified: Secondary | ICD-10-CM

## 2023-05-18 DIAGNOSIS — I480 Paroxysmal atrial fibrillation: Secondary | ICD-10-CM

## 2023-05-18 DIAGNOSIS — I10 Essential (primary) hypertension: Secondary | ICD-10-CM | POA: Diagnosis not present

## 2023-05-18 DIAGNOSIS — G4733 Obstructive sleep apnea (adult) (pediatric): Secondary | ICD-10-CM

## 2023-05-18 DIAGNOSIS — Z79899 Other long term (current) drug therapy: Secondary | ICD-10-CM

## 2023-05-18 DIAGNOSIS — Z5181 Encounter for therapeutic drug level monitoring: Secondary | ICD-10-CM

## 2023-05-18 DIAGNOSIS — G4739 Other sleep apnea: Secondary | ICD-10-CM | POA: Diagnosis not present

## 2023-05-18 DIAGNOSIS — D6869 Other thrombophilia: Secondary | ICD-10-CM

## 2023-05-18 NOTE — Progress Notes (Signed)
Anesthesia Chart Review   Case: [id number] Date/Time: 05/19/23 1315   Procedure: LEFT INFERIOR PARATHYROIDECTOMY (Left)   Anesthesia type: General   Pre-op diagnosis: PRIMARY HYPERPARATHYROIDISM   Location: [facility] ROOM 01 / [facility]   Surgeons: Darnell Level, MD       DISCUSSION:82 y.o. former smoker with h/o HTN, OSA on BiPaP, DM II, thrombocytopenia, atrial fibrillation, primary hyperparathyroidism scheduled for above procedure 05/19/2023 with Dr. Darnell Level.   Pt seen by EP 05/17/2023. Per OV note, "Has plans for parathyroid surgery. He is able to exert himself without issue. He can do his daily activities. He would be at intermediate risk for an intermediate risk procedure. No further cardiac testing is necessary. "  VS: BP (!) 147/90   Pulse 65   Temp 36.7 C (Oral)   Resp 16   Ht 5\' 11"  (1.803 m)   Wt 95.6 kg   SpO2 97%   BMI 29.40 kg/m   PROVIDERS: Donita Brooks, MD is PCP   Cardiologist - Nicki Guadalajara, MD   Regan Lemming, MD is EP LABS: Labs reviewed: Acceptable for surgery. (all labs ordered are listed, but only abnormal results are displayed)  Labs Reviewed  COMPREHENSIVE METABOLIC PANEL - Abnormal; Notable for the following components:      Result Value   BUN 35 (*)    Creatinine, Ser 2.58 (*)    GFR, Estimated 24 (*)    All other components within normal limits  CBC - Abnormal; Notable for the following components:   Platelets 129 (*)    All other components within normal limits  GLUCOSE, CAPILLARY - Abnormal; Notable for the following components:   Glucose-Capillary 103 (*)    All other components within normal limits  HEMOGLOBIN A1C     IMAGES:   EKG:   CV: Echo 12/23/2020 1. Left ventricular ejection fraction, by estimation, is 60 to 65%. The  left ventricle has normal function. The left ventricle has no regional  wall motion abnormalities. There is mild concentric left ventricular  hypertrophy. Left ventricular diastolic  parameters  are consistent with Grade II diastolic dysfunction  (pseudonormalization).   2. Right ventricular systolic function is normal. The right ventricular  size is normal. There is normal pulmonary artery systolic pressure.   3. Left atrial size was moderately dilated.   4. Right atrial size was mildly dilated.   5. The mitral valve is normal in structure. Trivial mitral valve  regurgitation. No evidence of mitral stenosis.   6. The aortic valve is tricuspid. There is mild calcification of the  aortic valve. Aortic valve regurgitation is mild. Mild aortic valve  sclerosis is present, with no evidence of aortic valve stenosis.   7. The inferior vena cava is normal in size with greater than 50%  respiratory variability, suggesting right atrial pressure of 3 mmHg.   Comparison(s): No significant change from prior study.  Past Medical History:  Diagnosis Date   Allergy    Arthritis    Cataract    Clotting disorder (HCC)    Colon polyps    Diabetes mellitus without complication (HCC)    Diverticulitis    Diverticulosis    GERD 12/07/2006   History of kidney stones    HYPERGLYCEMIA 12/07/2006   HYPERLIPIDEMIA 12/07/2006   mixed wth an atherogenic dyslipidemic pattern   HYPERTENSION 12/07/2006   Nephrolithiasis    Nonalcoholic fatty liver disease    Normal coronary arteries 04/06/2007   by cath EF 55%., last ECHO 10/13/10  EF>55% mild MR, last nuc 06/16/10 EF 57% low risk scan   OBESITY 11/10/2009   OSA treated with BiPAP 10/22/2007   PAF (paroxysmal atrial fibrillation) (HCC) 04/09/2013   Palpitations    tachy   Primary hyperparathyroidism (HCC)    Pulmonary HTN (HCC)    RV syst.  40-60mmHg- moderate by echo   Renal insufficiency    Cr to 3 on ACE   Screening for prostate cancer 01/11/2023   Sleep apnea    SOB (shortness of breath)    with exertion   THROMBOCYTOPENIA 12/07/2006   TRANSAMINASES, SERUM, ELEVATED 12/07/2006   VENTRICULAR TACHYCARDIA 03/27/2007   monitored by dr.  Tresa Endo    Past Surgical History:  Procedure Laterality Date   A-FLUTTER ABLATION N/A 04/19/2017   Procedure: A-FLUTTER ABLATION;  Surgeon: Duke Salvia, MD;  Location: [facility] INVASIVE CV LAB;  Service: Cardiovascular;  Laterality: N/A;   CARDIAC CATHETERIZATION  04/06/2007   normal cors   CARDIOVERSION N/A 08/05/2013   Procedure: CARDIOVERSION;  Surgeon: Lennette Bihari, MD;  Location: [facility] ENDOSCOPY;  Service: Cardiovascular;  Laterality: N/A;   CARDIOVERSION N/A 12/26/2016   Procedure: CARDIOVERSION;  Surgeon: Lennette Bihari, MD;  Location: [facility] ENDOSCOPY;  Service: Cardiovascular;  Laterality: N/A;   CHOLECYSTECTOMY     CYSTOSCOPY/RETROGRADE/URETEROSCOPY  08/19/2011   Procedure: CYSTOSCOPY/RETROGRADE/URETEROSCOPY;  Surgeon: Lindaann Slough, MD;  Location: [facility];  Service: Urology;  Laterality: Right;  JJ STENT PLACEMENT   EYE SURGERY     ROTATOR CUFF REPAIR Right     MEDICATIONS:  tadalafil (CIALIS) 5 MG tablet   amiodarone (PACERONE) 200 MG tablet   apixaban (ELIQUIS) 2.5 MG TABS tablet   atorvastatin (LIPITOR) 10 MG tablet   carvedilol (COREG) 12.5 MG tablet   empagliflozin (JARDIANCE) 10 MG TABS tablet   fluticasone (FLONASE) 50 MCG/ACT nasal spray   furosemide (LASIX) 40 MG tablet   hydrALAZINE (APRESOLINE) 25 MG tablet   omega-3 acid ethyl esters (LOVAZA) 1 g capsule   OZEMPIC, 0.25 OR 0.5 MG/DOSE, 2 MG/3ML SOPN   No current facility-administered medications for this encounter.     Jodell Cipro Ward, PA-C [facility] [phone]

## 2023-05-18 NOTE — Patient Instructions (Signed)
Medication Instructions:  The current medical regimen is effective;  continue present plan and medications as directed. Please refer to the Current Medication list given to you today.   *If you need a refill on your cardiac medications before your next appointment, please call your pharmacy*   Lab Work: No labs were ordered during today's visit.  If you have labs (blood work) drawn today and your tests are completely normal, you will receive your results only by: MyChart Message (if you have MyChart) OR A paper copy in the mail If you have any lab test that is abnormal or we need to change your treatment, we will call you to review the results.   Testing/Procedures: No procedures ordered today.    Follow-Up: At [facility], you and your health needs are our priority.  As part of our continuing mission to provide you with exceptional heart care, we have created designated Provider Care Teams.  These Care Teams include your primary Cardiologist (physician) and Advanced Practice Providers (APPs -  Physician Assistants and Nurse Practitioners) who all work together to provide you with the care you need, when you need it.  We recommend signing up for the patient portal called "MyChart".  Sign up information is provided on this After Visit Summary.  MyChart is used to connect with patients for Virtual Visits (Telemedicine).  Patients are able to view lab/test results, encounter notes, upcoming appointments, etc.  Non-urgent messages can be sent to your provider as well.   To learn more about what you can do with MyChart, go to [email or web address].    Your next appointment:   4 month(s)  Provider:   Nicki Guadalajara, MD     Other Instructions If you have any questions or concerns regarding your c-pap, bi-pap or sleep accessories, please contact Brandie Rorie at [phone].

## 2023-05-18 NOTE — Progress Notes (Signed)
Changed pressure support to 6 Max IPAP changed to 24

## 2023-05-18 NOTE — Progress Notes (Addendum)
Patient ID: Martin Cordova, male   DOB: March 22, 1941, 82 y.o.   MRN: [id number]       HPI: Martin Cordova is a 82 y.o. male is who presents to the office today for a 3 month follow-up cardiology clinic evaluation.  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 In the past, he developed an obstructive uropathy attributed to kidney stones; creatinine had risen up to 3 and ultimately improved and stabilized at approximately 1.7. Martin Cordova uses his BiPAP daily and does note good sleep.  In the past he had issues with mild peripheral edema, which ultimately improved.  He had been previously taken off his lisinopril when his creatinine had risen in the setting of his obstructive uropathy.  In 2015 he developed recurrent episodes of palpitations and was found to have recurrent paroxysmal atrial fibrillation/flutter.  His medications were adjusted and  he was started on antiarrhythmic therapy with Rythmol to take in addition to his increasing doses of carvedilol.  He was started on Eliquis for anticoagulation.  He underwent successful cardioversion on 08/05/2013.  Since that time, he is unaware of any recurrent atrial fibrillation.  He has noticed more energy. He had been maintained on 50 mg twice a day of carvedilol in addition to Rythmol 225 mg every 8 hours, but due to slow pulse rates, these doses have ultimately been reduced.  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 He has been on CPAP therapy initially for approximately 8 years and has been on BiPAP auto SV for over 4 years. He admits to using his BiPAP with 100% compliance.  He obtained a new BiPAP machine in June.  He feels that his machine is  significantly improved from his prior one. When I last saw him I reviewed his  download of his new BiPAP auto SV machine indicates that 90% of the time is EPAP pressure was 17 and 90% of the time his pressure support was 5.8, giving an IPAP of 21.8 cm.  He is using it 100% of the time and is averaging 5 hours and 49 minutes on his most recent download from 12/07/2013 through 01/05/2014.  His average AHI was 7.5.  His device setting maximum BiPAP pressure is 25 cm in maximum EPAP pressure 21 cm.  His average central event index is 1.3, and average obstructive apneic index 0.2, with an average copy index of 6.0.  Average breath.  Rate is 18 breaths per minute with a minute ventilation of 12.1.  Average total body may 670 mL.  When I saw him on 10/21/15 he was unaware of any arrhythmia.  He was on carvedilol 12.5 mg twice a day, Rythmol 225 mg twice a day, hydralazine 25 mg twice a day and eliquis  5 mg twice a day.  He also has been taking omeprazole for GERD and atorvastatin 10 mg for hyperlipidemia. He was found to have recurrent atrial flutter with variable block.  At that time, I recommended that he increase his Rythmol and take 225 mg every 8 hours and further increased his carvedilol to 18.75 mg twice a day.  Laboratory had revealed a magnesium of 2.0.  He has stage III chronic kidney  disease and his BUN was 26, creatinine 1.71 with a GFR estimate of 39.  Potassium was 4.8.  TSH was normal at 2.4.  Insomnia.  One week later in follow-up he had reverted back to sinus rhythm.  When seen in September 2017, he was maintaining sinus rhythm.  Since then, he admits to weight gain.  In early April, he began to notice some increasing shortness of breath and elevated heart rate and fatigue.  He was seen by Azalee Course on 09/07/2016 and was found to be back in atrial flutter with a ventricular rate at 90.  He has been on eloquence 5 g twice a day for anticoagulation and was on carvedilol 18.75 twice a day.  Carvedilol dose  was increased to 25 mg twice a day.  He was also told at that time to try increasing his Propofenone to 300 mg 3 times a day.  He took this for several days but did not tolerate the increased dose and reduced it back to its previous dose of 225 mg every 8 hours.  When I saw him on 10/20/2016  I increased carvedilol to 37.5 mg twice a day.  He has continued to use his BiPAP therapy for sleep apnea.    When I saw him in June 2018 he was still in atrial flutter with 3:1 block.  I discontinued hydralazine and started him on Cardizem CD 180 mg, both for blood pressure and potential antiarrhythmic therapy. He also was having ankle edema and I started him on low-dose hydrochlorothiazide.  His peripheral edema significantly improved.  He has felt better.  He denied episodes of chest tightness.  He underwent successful outpatient DC cardioversion by me on 12/26/2016.  He successfully cardioverted at 120 joules.  When he returned for a follow-up evaluation on 01/12/2017 and saw  Azalee Course, [facility] .  He was back in atrial flutter.  At that time his propafenone was discontinued.  Over the past several months, Martin Cordova has felt well.  At times there is some trace swelling in his ankles.  He continues to use his BiPAP with 100% compliance.  He was hospitalized on September 19 with an episode of diverticulitis and was discharged on 02/24/2017.   When I saw him in October 2018, he was back in atrial flutter.  At that time, I recommended consideration for atrial flutter ablation.  He was evaluated by Dr. Graciela Husbands and on 04/19/2017 underwent successful atrial flutter ablation across the caval tricuspid isthmus, which interrupted bidirectional conduction.  Initial plan was for him to continue anticoagulation for 4 weeks, but unfortunately he developed irregular tachycardia palpitations about 48 hours after the procedure.  He's been found to have paroxysmal atrial fibrillation.  He as result is on chronic anticoagulation therapy.   In November 2018 an echo Doppler study showed an EF of 40-45%.  He has been on carvedilol 25 mg twice a day and takes an extra carvedilol as needed.  He has had recurrent short burst of atrial fibrillation, but had an episode for almost an entire day.  He received a new BiPAP machine after his previous machine completely malfunction.  He admits to 100% compliance.  [facility] is his DME company.  He has diabetes mellitus and was started back on Tradjenta for hemoglobin A1c of 7.    He has a [employer] Dream Station BiPAP auto SV unit. I obtained a download from September 02, 2017 through October 01, 2017.  He is compliant.  His average CPAP  pressure was 16.8 and average pressure support pressure was 4.1.  90% of the time device EPAP was 17 cm.  AHI, however, was still mildly increased at 8.9.  He is unaware of breakthrough snoring.  He denies restless legs.  He denies chest pain.  He has noticed some mild episodes of dizziness in the morning.    I recommended some changes to his BiPAP therapy and change his ramp time to 5 minutes, increased but pressure support to 5.5 increased his EPAP to 9 cm water pressure.  A download was obtained from June 12 through December 14, 2017.  He is 100% compliant and averaging his hours and 24 minutes of CPAP use per night.  His average AHI is still slightly elevated 8.2.  90% of time device CPAP was 17 cm water pressure.  Though his pressure support range can from 0 to 5.5 cm, he apparently has only been needing a pressure support of 2.4 cm of water.  He feels well.  He has a F&P medium cushion full facemask but he is an oral breather and when he opens his mouth the facemask is actually too small which may be contributing to some of his increased AHI.  He presents to sleep clinic for further evaluation. He had been evaluated by Dr. Darrick Penna for his renal issues.  He denies any awareness of recurrent atrial flutter.  He has seen Dr. Tanya Nones here he also has been diagnosed with  monoclonal gammopathy of undetermined significance and is monitored by Dr. Candise Che.  He had undergone a cardiac MRI which was ordered by Dr. Graciela Husbands last year in February 2019 which showed normal LV size and function with an EF of 54%.  There was no delayed gadolinium uptake on inversion recovery sequences.  He had a trileaflet aortic valve with mild AR, mild MR, moderate RV enlargement, moderate biatrial enlargement and there was moderate LVH with the mid and basal septum measuring 14 mm compared to the posterior wall at 9 mm.  I last saw him in February 2020 at which time he denied any recurrent chest pain or abnormal heart rhythm.  He was using his BiPAP auto SV with 100% compliance.  A download was obtained in the office from January 26 through July 30, 2018.  He is 100% compliant.  He requires high pressures and his average EPAP pressure was 17 which also was his 90% pressure.  He is set up to a potential maximum IPAP pressure of 30.  His pressure support ranges from 0-5.5 with an average pressure support of 2.4 cm.  He is now using a large mask which is much more comfortable and is he is doing better.  On his most recent download, his AHI was still mildly increased at 7.1/h.  He has undergone follow-up hematologic evaluation with Dr. Domenic Polite for his monoclonal gammopathy.    I saw him on April 05, 2019.  Since his prior evaluation he felt that he may have had 3 brief short-lived episodes of PAF which resolved spontaneously.  He denied any associated chest pain. He underwent Mohs surgery for a skin cancer on his chin.  He was unable to use BiPAP for several days since this is where his mask would rest.  I obtained a download from July 1 through April 04, 2019.  His minimum EPAP pressure was 17 with a maximum IPAP pressure of 30 and pressure support of 5.5 cm.  Apparently his ramp start pressure is 9.5 cm.  Average AHI is 6.9.  90% of the time his device EPAP pressure was 17.  Average breath rate was 16.1  breaths/min.   Echo Doppler study on January 25, 2019 showed an EF of 60 to 65% with mild LVH.  Normal diastolic parameters.  There was moderate aortic sclerosis without stenosis and mild AR.  I saw him on Oct 08, 2019 at which time he felt well.  He is followed by nephrology with stage IV chronic kidney disease with recent creatinine in February 2021 at 2.59.  He was unaware of any recurrent atrial fibrillation or flutter.  He continues to use BiPAP auto SV therapy with 100% compliance.  He is followed by Dr. Sharl Ma for his diabetes mellitus.   I saw him in November 2021 at which time he remained stable.  He is now followed by Dr. Allyne Gee with the imminent retirement of Dr. Allyne Gee.  Laboratory from April 03, 2020 did show improvement in his creatinine which typically range between 2 and 2.5 and was 1.99.  He denies any significant shortness of breath.  At times he has noticed.  Short brief episodes of possible PAF which self resolved.  His medical regimen has included carvedilol 25 mg twice a day, diltiazem 240 mg daily, furosemide 40 mg for blood pressure and heart rate control.  He is diabetic on Tradjenta.  He has been on Eliquis without bleeding for anticoagulation and continues to be on low-dose atorvastatin and lovaza for hyperlipidemia.  He has a [employer] BiPAP auto SV machine and I discussed with him the recall with a very small risk of Styrofoam breakdown contributed by possible ozone related cleaning cleansing agents.  He had contacted [employer] and is on a list for a new machine.    I saw him for follow-up evaluation on December 04, 2020 and last saw him 3 weeks later on December 21, 2020.  Since his July 1 evaluation he had noticed several months of increased fatigability as well as some mild leg swelling right greater than left.  He denied any chest pain.  He has continued to use his BiPAP ASV.  I obtained a download in the office from June 1 through December 03, 2020 which shows 100% use.  AHI was 5.8  and is 90% EPAP was 17 and 90% pressure support was 3.3 cm.  He denies any chest pain.  He is unaware of rapid palpitations.  During that evaluation, his ECG confirmed that he was in atrial fibrillation with ventricular rate at 79 with right bundle branch block and PVCs.  I suspected that his atrial fibrillation may have been of several months duration and he had been on diltiazem CD2 140 mg daily, carvedilol 25 mg twice a day, and furosemide 40 mg daily both for rate control, edema and blood pressure control.  With his recurrent atrial fibrillation I recommended institution of amiodarone and started him on 200 mg twice a day.  I discussed potential adverse consequences of amiodarone and the importance of monitoring LFTs and thyroid function.  I scheduled him for laboratory and recommended a follow-up echo Doppler study.  He states he is prescription filled and started his therapy on December 08, 2020.  After several days he seemed to notice slightly more energy.  He is unaware of palpitations.   When I saw him on December 21, 2020, his ECG confirmed that he had converted to sinus rhythm.  At that time, I recommended he continue to take amiodarone 200 mg twice a day with plans to eventually reduce  his dosing.  He underwent a follow-up echo Doppler study on December 23, 2020 which showed normal LV function with EF at 60 to 65%, mild concentric LVH, grade 2 diastolic dysfunction, and there was evidence for moderate left atrial dilatation and mild right atrial dilatation.  There was mild aortic sclerosis without stenosis.  He now sees Dr. Marisue Humble for his CKD.  At his March 17, 2021 follow-up evaluation he remained stable and denied any chest pain or shortness of breath, dizziness, presyncope or syncope. He is seeing Dr. Tanya Nones.  He is no longer on Tradjenta but is now on Farxiga for his diabetes mellitus.  He continues to be on Eliquis 5 mg twice a day, carvedilol 25 mg twice a day, diltiazem 240 mg daily as well as  amiodarone 200 mg twice a day.  He has been taking furosemide 40 alternating with 20 mg every other day.  He is on atorvastatin and Lovaza for mixed hyperlipidemia.  His ECG revealed sinus bradycardia and I recommended reduction of his amiodarone dose from 40 mg daily down to 300 mg for 3 weeks and further reduction to 200 mg daily.  He continued to be on carvedilol 25 mg twice a day and long-acting diltiazem 240 mg daily.  He sees Dr. Marisue Humble for his CKD and in July 2022 creatinine had increased to 2.24.  He continues to use BiPAP with 100% compliance.  I saw him on May 19, 2021 at which time he felt well.  Ten days previously  he tested positive for COVID and had mild URI symptoms without fever but did lose his sense of taste for several days.  He presently feels well.  He is unaware of any definitive breakthrough atrial fibrillation but he had noticed transient mild irregularity to heart rhythm last evening.  He continues to be on Eliquis 5 mg twice a day, amiodarone 20 mg daily, carvedilol 25 mg twice a day, diltiazem 240 mg, and has been taking furosemide 40 alternating with 20 mg every other day.  He had undergone recent laboratory on November 11 which showed creatinine at 2.2.  LDL cholesterol in July was 48.  During that evaluation, blood pressure was stable.  He was maintaining sinus rhythm.  I saw him on April 10, 2022 and he continued to do well.   He currently is on amiodarone 200 mg alternating with 300 mg every other day, carvedilol 12.5 mg twice a day, diltiazem 240 mg daily, Eliquis 5 mg twice a day, furosemide 20 mg alternating with 40 mg on alternating days, Jardiance 10 mg, and Lovaza 2 capsules twice a day in addition to atorvastatin 10 mg at bedtime.  He is unaware of recurrent atrial fibrillation.  He continues to see Dr. Sharl Ma for endocrine.  He admits to some fatigability.  He is being followed by Dr. Marisue Humble of nephrology with creatinine July 2023 at 2.68.  He continues to be on  BiPAP with 100% use.  During that evaluation, his blood pressure was elevated and he was bradycardic.  I recommended he reduce carvedilol to 12.5 mg in the morning and 6.2 5 at night and I initiated low-dose hydralazine to 12.5 mg twice a day.  I saw him on September 01, 2022.  He was continuing to be followed by Dr. Marisue Humble of nephrology.  Recently his hydralazine dose was increased to 25 mg twice a day by Dr. Marisue Humble.  He is now on amiodarone 200 mg daily, carvedilol 12.5 mg in the morning and 6.25 mg  in the evening, diltiazem CD2 140 mg daily, furosemide 40 mg alternating with 20 mg every other day, hydralazine 25 mg twice a day, Jardiance 10 mg daily.  He is on atorvastatin 10 mg and Lovaza 2 capsules daily.  He continues to use BiPAP auto SV and has a Respironics dream ST auto SV unit which is not Designer, industrial/product.  He admits to 100% compliance.  He states his current mask is too small.  He brought his machine with him to the office.  He admits to fatigue.  An Epworth Sleepiness Scale score was calculated today and this endorsed at 12 consistent with daytime sleepiness.  I was able to interrogate his machine; usage is 100%.  However average use per day is 4.6 hours.  AHI remains elevated at 25.7/h.  During that evaluation, he was in sinus rhythm at 54 bpm with first-degree AV block.  I provided him with a new ResMed AirTouch F20 large mask instead of his previous medium mask.  I recommended he undergo a BiPAP/ASV titration prior to him receiving a new machine.  I last saw him on February 23, 2023.  Martin Cordova underwent an new BiPAP titration and apparently was titrated up to optimal pressure at 22 over 18 cm of water.  During that study he was not transition to ASV.  Central events were present at 10/6 but were not present at higher BiPAP pressures.  He subsequently received a new BiPAP air curve 11 via auto unit last week on December 20, 2022.  He can tell the difference that this does not have ASV.  A download was  obtained for his 7 days use which showed average use at 6 hours and 14 minutes.  He is requiring high pressure and his 95th percentile pressure is 22.9/18.9 in the setting is a minimum EPAP of 18 with maximum IPAP of 25.  AHI is elevated at 23.8 of which 12.6 are central events and 10.1 obstructive events.  He tells me he has been in atrial fibrillation most of the month.  His heart rate has been in the low 50s.  He has been taking amiodarone 200 mg alternating with 100 mg every other day, carvedilol 12.5 mg in the morning and 6.25 mg in the evening, diltiazem 240 mg daily, Eliquis 5 mg twice a day, hydralazine 37.5 mg twice a day, furosemide 40 mg, Jardiance 10 mg, atorvastatin 10 mg, Lovaza 2000 mg and is on weekly injections of Ozempic.  During that evaluation, I changed his BiPAP therapy and discussed need for possible ASV titration if events continued with BiPAP.  I referred him to Dr. Elberta Fortis for EP evaluation who he saw yesterday, and there is tentative plan for him to undergo A-fib ablation in several months as the schedule allows.  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 Gerrit Friends with plans for left inferior parathyroidectomy tomorrow.  He presents for evaluation.  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 Tresa Endo   Diabetes mellitus without complication    Renal insufficiency     Cr to 3 on ACE  Nephrolithiasis    Normal coronary arteries 04/06/2007    by cath EF 55%., last ECHO 10/13/10 EF>55% mild MR, last nuc 06/16/10 EF 57% low risk scan   Pulmonary HTN     RV syst.  40-2mmHg- moderate by  echo   Palpitations     tachy   PAF (paroxysmal atrial fibrillation) 04/09/2013    Past Surgical History  Procedure Laterality Date   Cholecystectomy     Rotator cuff repair     Cystoscopy/retrograde/ureteroscopy  08/19/2011    Procedure: CYSTOSCOPY/RETROGRADE/URETEROSCOPY;  Surgeon: Lindaann Slough, MD;  Location: [facility] [place];  Service: Urology;  Laterality: Right;  JJ STENT PLACEMENT    Cardiac catheterization  04/06/2007    normal cors    Allergies  Allergen Reactions   Lisinopril     Renal insufficiency    Current Outpatient Medications:    amiodarone (PACERONE) 200 MG tablet, Take 1 tablet (200 mg total) by mouth 2 (two) times daily., Disp: 90 tablet, Rfl: 3   apixaban (ELIQUIS) 2.5 MG TABS tablet, Take 1 tablet (2.5 mg total) by mouth 2 (two) times daily., Disp: 60 tablet, Rfl: 5   atorvastatin (LIPITOR) 10 MG tablet, Take 1 tablet (10 mg total) by mouth daily with breakfast., Disp: 90 tablet, Rfl: 3   carvedilol (COREG) 12.5 MG tablet, Take 1 tablet ( 12.5 mg ) in morning and 1/2 tablet ( 6.25 mg ) in pm, Disp: 135 tablet, Rfl: 3   empagliflozin (JARDIANCE) 10 MG TABS tablet, Take 10 mg by mouth daily., Disp: , Rfl:    fluticasone (FLONASE) 50 MCG/ACT nasal spray, Place 2 sprays into both nostrils daily. (Patient taking differently: Place 2 sprays into both nostrils daily as needed for rhinitis.), Disp: 16 g, Rfl: 6   furosemide (LASIX) 40 MG tablet, Alternate between taking 40mg  (1 tablet) a day and 20mg  (1/2 tablet) a day., Disp: 90 tablet, Rfl: 3   hydrALAZINE (APRESOLINE) 25 MG tablet, Take 1.5 tablets (37.5 mg total) by mouth 2 (two) times daily., Disp: 270 tablet, Rfl: 3   omega-3 acid ethyl esters (LOVAZA) 1 g capsule, TAKE 2 CAPSULES BY MOUTH ONCE DAILY., Disp: 180 capsule, Rfl: 3   OZEMPIC, 0.25 OR 0.5 MG/DOSE, 2 MG/3ML SOPN, Inject 0.25 mg into the skin once a week., Disp: , Rfl:    tadalafil (CIALIS) 5 MG tablet, Take 5 mg by mouth every evening.,  Disp: , Rfl:    traMADol (ULTRAM) 50 MG tablet, Take 1 tablet (50 mg total) by mouth every 6 (six) hours as needed for moderate pain (pain score 4-6)., Disp: 12 tablet, Rfl: 0  Socially he is married has 4 children and 5 grandchildren. He is a distant relative to the "Arntson brothers." He does try to walk and exercise. There is no tobacco use. He does drink occasional alcohol.   ROS General: Negative; No fevers, chills, or night sweats;  HEENT: Negative; No changes in vision or hearing, sinus congestion, difficulty swallowing Pulmonary: Negative; No cough, wheezing, shortness of breath, hemoptysis Cardiovascular: See history of present illness No recent peripheral edema GI: Status post recent episode of diverticulitis requiring 2 day hospitalization GU:  No dysuria, hematuria, or difficulty voiding; some difficulty with erectile function Musculoskeletal: Negative; no myalgias, joint pain, or weakness Hematologic/Oncology: Positive M spike with monoclonal gammopathy of undetermined significance; history of thrombocytopenia Endocrine: Negative; no heat/cold intolerance; no diabetes Neuro: Negative; no changes in balance, headaches Skin: Negative; No rashes or skin lesions Psychiatric: Negative; No behavioral problems, depression  Sleep: See HPI  No snoring, daytime sleepiness, hypersomnolence, bruxism, restless legs, hypnogognic hallucinations, no cataplexy Other comprehensive 14 point system review is negative.  PE BP (!) 154/70 (BP Location: Left Arm, Patient Position: Sitting, Cuff Size: Large)   Pulse (!) 53   Ht 5\' 11"  (1.803 m)   Wt 214 lb 3.2 oz (97.2 kg)   SpO2 95%   BMI 29.87 kg/m    Repeat blood pressure by me was 128/74  Wt Readings from Last 3 Encounters:  05/19/23 214 lb 3.2 oz (97.2 kg)  05/18/23 214 lb 3.2 oz (97.2 kg)  05/17/23 213 lb 12.8 oz (97 kg)   General: Alert, oriented, no distress.  Skin: normal turgor, no rashes, warm and dry HEENT: Normocephalic,  atraumatic. Pupils equal round and reactive to light; sclera anicteric; extraocular muscles intact;  Nose without nasal septal hypertrophy Mouth/Parynx benign; Mallinpatti scale 3/4 Neck: No JVD, no carotid bruits; normal carotid upstroke Lungs: clear to ausculatation and percussion; no wheezing or rales Chest wall: without tenderness to palpitation Heart: PMI not displaced, RRR, s1 s2 normal, 1/6 systolic murmur, no diastolic murmur, no rubs, gallops, thrills, or heaves Abdomen: soft, nontender; no hepatosplenomehaly, BS+; abdominal aorta nontender and not dilated by palpation. Back: no CVA tenderness Pulses 2+ Musculoskeletal: full range of motion, normal strength, no joint deformities Extremities: no clubbing cyanosis or edema, Homan's sign negative  Neurologic: grossly nonfocal; Cranial nerves grossly wnl Psychologic: Normal mood and affect      EKG Interpretation Date/Time:  Thursday May 18 2023 08:59:54 EST Ventricular Rate:  53 PR Interval:  220 QRS Duration:  172 QT Interval:  476 QTC Calculation: 446 R Axis:   -31  Text Interpretation: Sinus bradycardia with 1st degree A-V block Left axis deviation Right bundle branch block When compared with ECG of 17-May-2023 15:18, No significant change was found Confirmed by Nicki Guadalajara ([id number]) on 05/18/2023 9:57:17 AM    February 23, 2023 ECG (independently read by me): Atrial fibrillation, right bundle branch block QT interval 494   September 01, 2022 ECG (independently read by me): Sinus bradycardia at 54, 1st degree block, PR 226 msec, RBBB, LAHB   April 11, 2022 ECG (independently read by me): Sinus bradycardia at 48, RBBB, 1st degree block, PR 246 msec, T wave abnormality  May 19, 2021 ECG (independently read by me): Sinus bradycardiia at 53, RBBB, LAHB  March 17, 2021 ECG (independently read by me): SInus bradycardia at 45, 1st degree AV block, PR 216 msec; RBBB  December 21, 2020 ECG (independently read by me):  Sinus bradycardia at 55, 1st degree AV block; PR 228 msec; RBBBB   December 04, 2020 ECG (independently read by me): Atrial fibrillation at 79, RBBB, PVC  May 04, 2020 ECG (independently read by me): Sinus bradycardia at 56, PVC, RBBB  May 4, 2021ECG (independently read by me): Normal sinus rhythm at 63 bpm with premature complex.  Right bundle branch block with repolarization changes.  Normal intervals.  April 05, 2019 ECG (independently read by me): Sinus Bradycardia ay 55: RBBB with repolarization.  February 25,2020 ECG (independently read by me): Sinus bradycardia at 56 bpm.  Right bundle branch block with repolarization changes.  October 03, 2017 ECG (independently read by me):  Sinus bradycardia at 50 bpm.  Right bundle branch block with repolarization changes. QTc interval 461 ms.  January 2019 ECG (independently read by me): Sinus bradycardia 56 bpm.  Possible left atrial enlargement.  Right bundle branch block with repolarization changes.  PR interval 186 ms; QTc interval 436 ms  October 2018 ECG (independently read by me): Atrial flutter with variable block at 96 bpm.  Right bundle-branch block with repolarization changes.  QTc interval 437 ms  July 2018 ECG (independently read by me): Probable atrial flutter with a ventricular rate at 82 bpm.  Right bundle-branch block, left anterior hemiblock.  11/08/2016 ECG (independently read by me): Probable atrial flutter 3:1 block , ventricular rate at 85;  right bundle branch block with repolarization changes.  QTc interval 528 ms.   May 2018 ECG (independently read by me): atrial flutter with variable block with ventricular rate at 89 bpm.  Right bundle-branch block, left anterior hemiblock.  September 2017 ECG (independently read by me): Normal sinus rhythm at 63 bpm.  First-degree AV block with a PR interval of 208 ms.  Right bundle branch block with repolarization changes.  QTc interval 466 ms.  10/28/15 ECG (independently read by  me): Sinus bradycardia at 54 bpm.  First degree block with PR interval 222 ms.  Right bundle-branch block.  10/21/2015 ECG (independently read by me): Atrial flutter with variable block.  Right bundle-branch block with repolarization changes.  October 2016 ECG (independently read by me): sinus rhythm with first-degree AV block with a PR interval at 216 ms.  Ventricular rate 86 bpm with occasional PVCs.    May 2016 ECG (independently read by me): Normal sinus rhythm at 60 bpm.  Right bundle-branch block with repolarization changes.  November 2015 ECG (independently read by me): Normal sinus rhythm at 61.  Nonspecific T abnormality.  QTc interval 394 ms.  11/18/2013 ECG (independently read by me): Sinus bradycardia 58 beats per minute.  PR interval 198 ms; QTc interval 371 ms.  Nonspecific ST changes.  07/29/2013 ECG  (independently read by me): Atrial flutter with 2:1 block with a ventricular rate of 87 beats per minute. Atrial rate is approximately 360 ms. 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 fib flutter now with right bundle branch block and repolarization changes.  Prior ECG of 06/04/2013: EKG  suggests probable atrial flutter with a ventricular rate of 105 beats per minute. There also are frequent PVCs. QTc interval is 436 ms. They're nonspecific T changes.  LABS:    Latest Ref Rng & Units 05/16/2023    9:04 AM 12/26/2022    8:53 AM 10/22/2021    9:53 AM  BMP  Glucose 70 - 99 mg/dL 84  841  324   BUN 8 - 23 mg/dL 35  34  26   Creatinine 0.61 - 1.24 mg/dL 4.01  0.27  2.53   BUN/Creat Ratio 6 - 22 (calc)  12  10   Sodium 135 - 145 mmol/L 140  142  142   Potassium 3.5 - 5.1 mmol/L 4.0  4.7  5.1   Chloride 98 - 111 mmol/L 107  109  107   CO2 22 - 32 mmol/L 26  24  24    Calcium 8.9 - 10.3 mg/dL 66.4  40.3  47.4       Latest Ref Rng & Units 05/16/2023    9:04 AM 12/26/2022    8:53 AM 10/22/2021    9:53 AM   Hepatic Function  Total Protein 6.5 - 8.1 g/dL 6.6  6.6  6.6   Albumin 3.5 - 5.0 g/dL 3.7   4.3   AST 15 - 41 U/L 26  18  22    ALT 0 -  44 U/L 38  27  37   Alk Phosphatase 38 - 126 U/L 52   60   Total Bilirubin <1.2 mg/dL 0.6  0.7  0.6       Latest Ref Rng & Units 05/16/2023    9:04 AM 12/26/2022    8:53 AM 10/22/2021    9:53 AM  CBC  WBC 4.0 - 10.5 K/uL 6.3  5.6  5.1   Hemoglobin 13.0 - 17.0 g/dL 14.7  82.9  56.2   Hematocrit 39.0 - 52.0 % 43.6  45.3  44.6   Platelets 150 - 400 K/uL 129  123  99    Lab Results  Component Value Date   TSH 2.66 12/26/2022   Lipid Panel     Component Value Date/Time   CHOL 113 12/26/2022 0853   TRIG 180 (H) 12/26/2022 0853   HDL 37 (L) 12/26/2022 0853   CHOLHDL 3.1 12/26/2022 0853   VLDL 37 (H) 10/25/2016 0853   LDLCALC 50 12/26/2022 0853   LDLDIRECT 65.0 04/12/2013 0825     RADIOLOGY: No results found.    Patient Name: Martin Cordova, Martin Cordova Date: 09/27/2022 Gender: Male D.O.B: 1940-08-17 Age (years): 37 Referring Provider: Nicki Guadalajara MD, ABSM Height (inches): 71 Interpreting Physician: Nicki Guadalajara MD, ABSM Weight (lbs): 227 RPSGT: Armen Pickup BMI: 32 MRN: [id number] Neck Size: 17.00   CLINICAL INFORMATION The patient is referred for a BiPAP/ASV titration to treat sleep apnea.   Patient has been using BiPAP Auto SV with an old Respironics Dream Station BiPAP auto SV unit.   SLEEP STUDY TECHNIQUE As per the AASM Manual for the Scoring of Sleep and Associated Events v2.3 (April 2016) with a hypopnea requiring 4% desaturations.   The channels recorded and monitored were frontal, central and occipital EEG, electrooculogram (EOG), submentalis EMG (chin), nasal and oral airflow, thoracic and abdominal wall motion, anterior tibialis EMG, snore microphone, electrocardiogram, and pulse oximetry. Bilevel positive airway pressure (BPAP) was initiated at the beginning of the study and titrated to treat sleep-disordered breathing.    MEDICATIONS amiodarone (PACERONE) 200 MG tablet apixaban (ELIQUIS) 5 MG TABS tablet atorvastatin (LIPITOR) 10 MG tablet carvedilol (COREG) 12.5 MG tablet diltiazem (CARDIZEM CD) 240 MG 24 hr capsule empagliflozin (JARDIANCE) 10 MG TABS tablet fluticasone (FLONASE) 50 MCG/ACT nasal spray furosemide (LASIX) 40 MG tablet hydrALAZINE (APRESOLINE) 25 MG tablet omega-3 acid ethyl esters (LOVAZA) 1 g capsule Medications self-administered by patient taken the night of the study : N/A   RESPIRATORY PARAMETERS Optimal IPAP Pressure (cm):22AHI at Optimal Pressure (/hr)0 Optimal EPAP Pressure (cm):            18           Overall Minimal O2 (%):82.0Minimal O2 at Optimal Pressure (%):86.0   SLEEP ARCHITECTURE Start Time:10:27:34 PMStop Time:4:27:57 AMTotal Time (min):360.4Total Sleep Time (min):219.5 Sleep Latency (min):9.4Sleep Efficiency (%):60.9%REM Latency (min):176.0WASO (min):131.5 Stage N1 (%):25.5%Stage N2 (%):55.8%Stage N3 (%):0.0%Stage R (%):18.7 Supine (%):     92.48   Arousal Index (/hr):     62.6        CARDIAC DATA The 2 lead EKG demonstrated sinus rhythm. The mean heart rate was 52.4 beats per minute. Other EKG findings include: None.   LEG MOVEMENT DATA The total Periodic Limb Movements of Sleep (PLMS) were 0. The PLMS index was 0.0. A PLMS index of <15 is considered normal in adults.   IMPRESSIONS - BiPAP was initiated at 10/6 and was titrated to an optimal PAP pressure at 22/18 cm of water. (  AHI 0, RDI 0; O2 nadir 86%). He was not transitioned to ASV. - Mild Central Sleep Apnea was noted during this titration (CAI 7.1/h). Central events were present at 10/6 to 13/9 cm, and were not present at higher BiPAP pressures.  - Moderate oxygen desaturations were observed during this titration to a nadir of 82% at 15/11 cm. - No snoring was audible during this study. - No cardiac abnormalities were observed during this study. - Clinically significant periodic limb movements were not  noted during this study. Arousals associated with PLMs were rare.   DIAGNOSIS - Obstructive Sleep Apnea (G47.33)   RECOMMENDATIONS - Recommend an initial trial of BiPAP Auto therapy with EPAP min of 18, PS of 4 and IPAP max of 25 cm H2O with heated humidification.  A Large size Resmed Full Face Mirage Quattro mask was used for the titration. If central events develop he will need a BiPAP Auto SV device.  - Effort should be made to optimize nasal and oropharyngeal patency. - Avoid alcohol, sedatives and other CNS depressants that may worsen sleep apnea and disrupt normal sleep architecture. - Sleep hygiene should be reviewed to assess factors that may improve sleep quality. - Weight management and regular exercise should be initiated or continued. - Recommend a download and turn to slep clinic evaluation after 4-6 weeks of therapy  IMPRESSION:  1. PAF (paroxysmal atrial fibrillation) (HCC)   2. Essential hypertension   3. Complex sleep apnea syndrome   4. OSA (obstructive sleep apnea)   5. Type 2 diabetes mellitus with chronic kidney disease, without long-term current use of insulin, unspecified CKD stage (HCC)   6. Hyperparathyroidism (HCC)   7. Right bundle branch block   8. CKD (chronic kidney disease) stage 4, GFR 15-29 ml/min (HCC)     ASSESSMENT AND PLAN: Martin Cordova is an 82 year-old Caucasian male who has documented normal coronary arteries by cardiac catheterization in 2008. He has a history of hypertension, mixed hyperlipidemia, and has paroxysmal atrial fibrillation/flutter. He has a history of diabetes mellitus and after he had lost a significant amount of weight and with renal insufficiency he was taken off his diabetic medication.  He had been started back on Tradjenta for his diabetes mellitus but since his most recent evaluation this has been discontinued and he was on Farxiga, and now most recently on Jardiance 10 mg daily. He underwent successful catheter ablation for  recurrent atrial flutter in November 2018 and remains on long-term anticoagulation with Eliquis.  Dr Graciela Husbands had ordered an MRI of his heart in February 2019 which did not reveal any delayed gadolinium uptake arguing against scar, infiltration or hypertrophic cardiomyopathy.  There was no SAM.  Since his evaluation with me in November 2021, he had noticed more fatigability.  At his office visit on December 04, 2020 ECG confirmed that he was in atrial fibrillation which I suspect was of several months duration.  At the time he already was on carvedilol 25 mg twice a day, Cardizem CD 240 mg daily, and furosemide 40 mg daily.  I initiated amiodarone 200 mg twice a day which he started on December 08, 2020.   When I saw him several weeks later on December 21, 2020 he felt improved and ECG confirmed that he had not converted to sinus rhythm.  At his evaluation in October 2022 he was maintaining sinus rhythm but was bradycardic with heart rate at 45.  At that time, his dose of amiodarone was reduced.  In November  2023, he continued to be bradycardic and his carvedilol dose was reduced.  Blood pressure was elevated and he was initiated on low-dose hydralazine.  Subsequently, his dose has been increased slightly by Dr. Allyne Gee who follows his renal function.  Creatinine was 2.68 in July 2023.  At his evaluation in March 2024 blood pressure remained elevated and hydralazine was further titrated to 37.5 mg twice a day.  He had been using an old Respironics dream ST auto SV unit for his complex sleep apnea.  His device was no longer downloadable and he continued to have significant events.  I extensively reviewed his most recent sleep evaluation where I had recommended he undergo BiPAP and possible ASV titration.  Apparently during that study he was titrated up to 22/18 with excellent response and as result ASV was never initiated.  When seen in September 2024 he had been in and out of atrial fibrillation for the past 3 to 4 weeks.  A  download of his new ResMed air curve 11 via auto unit shows moderate residual sleep apnea with both obstructive and central events with total AHI 23.8 and central AHI at 12.6.  He has only been on his new device for 7 days.  Pressure adjustment  was made to his unit with increasing pressure support to 5.  He believes his current mask is significantly improved from his prior mask.  He continued to be in atrial fibrillation and heart rate today is 51.  During that evaluation I discontinued diltiazem and recommended further titration of amiodarone.  He has subsequently been seen by Dr. Elberta Fortis.  He has been in sinus rhythm on his current drug regimen.  He is on a waiting list for atrial fibrillation ablation which may not be for another 3 to possibly 4 months due to backlog.  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 Darnell Level.  I reviewed his most recent BiPAP data.  Compliance is excellent although usage is only 6 hours and 34 minutes per night.  His AHI has improved and central events have reduced from 12.6 down to 2.0 with a previous increase in pressure support.  He is at near maximal pressure with his 95th percentile pressure at 23.9/18.9 and maximum average pressure 24.4/19.5.  I have suggested slight additional increase in pressure support to 6 cm of water.  [employer] is his DME company.  His blood pressure today is stable and at times he does experience some trace right ankle swelling.  Discontinuance of sodium was strongly advised.  He continues to be anticoagulated on Eliquis.  He continues to be on amiodarone 200 mg twice a day, carvedilol 12.5 mg in the morning and 6.25 mg at night, furosemide 40 alternating with 20 mg every other day, hydralazine 37.5 mg twice a day, and Jardiance 10 mg.  He is on Lovaza in addition to atorvastatin for hyperlipidemia and is undergoing weekly Ozempic injections.  On amiodarone, recent LFTs are normal.  He  has stage IV CKD with most recent creatinine slightly improved at 2.58 from 2.89 in July 2024.  I will see him in 3 to 4 months for follow-up evaluation.  Nicki Guadalajara, MD, [facility], ABSM Diplomate, American Board of Sleep Medicine  05/20/2023  10:17 AM

## 2023-05-19 ENCOUNTER — Ambulatory Visit (HOSPITAL_BASED_OUTPATIENT_CLINIC_OR_DEPARTMENT_OTHER): Payer: Medicare PPO | Admitting: Registered Nurse

## 2023-05-19 ENCOUNTER — Encounter (HOSPITAL_COMMUNITY)
Admission: RE | Disposition: A | Discharge status: Home / Self Care | Payer: Self-pay | Source: Ambulatory Visit | Attending: Surgery

## 2023-05-19 ENCOUNTER — Other Ambulatory Visit: Payer: Self-pay

## 2023-05-19 ENCOUNTER — Encounter (HOSPITAL_COMMUNITY): Payer: Self-pay | Admitting: Surgery

## 2023-05-19 ENCOUNTER — Ambulatory Visit (HOSPITAL_COMMUNITY)
Admission: RE | Admit: 2023-05-19 | Discharge: 2023-05-19 | Disposition: A | Discharge status: Home / Self Care | Payer: Medicare PPO | Source: Ambulatory Visit | Attending: Surgery | Admitting: Surgery

## 2023-05-19 ENCOUNTER — Ambulatory Visit (HOSPITAL_COMMUNITY): Payer: Medicare PPO | Admitting: Registered Nurse

## 2023-05-19 DIAGNOSIS — Z7984 Long term (current) use of oral hypoglycemic drugs: Secondary | ICD-10-CM | POA: Diagnosis not present

## 2023-05-19 DIAGNOSIS — Z7985 Long-term (current) use of injectable non-insulin antidiabetic drugs: Secondary | ICD-10-CM | POA: Diagnosis not present

## 2023-05-19 DIAGNOSIS — Q613 Polycystic kidney, unspecified: Secondary | ICD-10-CM | POA: Insufficient documentation

## 2023-05-19 DIAGNOSIS — E210 Primary hyperparathyroidism: Secondary | ICD-10-CM

## 2023-05-19 DIAGNOSIS — Z87891 Personal history of nicotine dependence: Secondary | ICD-10-CM | POA: Insufficient documentation

## 2023-05-19 DIAGNOSIS — E1122 Type 2 diabetes mellitus with diabetic chronic kidney disease: Secondary | ICD-10-CM | POA: Diagnosis not present

## 2023-05-19 DIAGNOSIS — I480 Paroxysmal atrial fibrillation: Secondary | ICD-10-CM | POA: Insufficient documentation

## 2023-05-19 DIAGNOSIS — E119 Type 2 diabetes mellitus without complications: Secondary | ICD-10-CM

## 2023-05-19 DIAGNOSIS — I700 Atherosclerosis of aorta: Secondary | ICD-10-CM | POA: Diagnosis not present

## 2023-05-19 DIAGNOSIS — K219 Gastro-esophageal reflux disease without esophagitis: Secondary | ICD-10-CM | POA: Insufficient documentation

## 2023-05-19 DIAGNOSIS — Z7901 Long term (current) use of anticoagulants: Secondary | ICD-10-CM | POA: Insufficient documentation

## 2023-05-19 DIAGNOSIS — N1832 Chronic kidney disease, stage 3b: Secondary | ICD-10-CM | POA: Insufficient documentation

## 2023-05-19 DIAGNOSIS — I129 Hypertensive chronic kidney disease with stage 1 through stage 4 chronic kidney disease, or unspecified chronic kidney disease: Secondary | ICD-10-CM | POA: Diagnosis not present

## 2023-05-19 DIAGNOSIS — G4733 Obstructive sleep apnea (adult) (pediatric): Secondary | ICD-10-CM | POA: Diagnosis not present

## 2023-05-19 DIAGNOSIS — D351 Benign neoplasm of parathyroid gland: Secondary | ICD-10-CM | POA: Insufficient documentation

## 2023-05-19 HISTORY — PX: PARATHYROIDECTOMY: SHX19

## 2023-05-19 LAB — GLUCOSE, CAPILLARY
Glucose-Capillary: 99 mg/dL (ref 70–99)
Glucose-Capillary: 98 mg/dL (ref 70–99)

## 2023-05-19 SURGERY — PARATHYROIDECTOMY
Anesthesia: General | Laterality: Left

## 2023-05-19 MED ORDER — PROPOFOL 10 MG/ML IV BOLUS
INTRAVENOUS | Status: DC | PRN
  Administered 2023-05-19: 150 mg via INTRAVENOUS

## 2023-05-19 MED ORDER — CEFAZOLIN SODIUM-DEXTROSE 2-4 GM/100ML-% IV SOLN
2.0000 g | INTRAVENOUS | Status: AC
  Administered 2023-05-19: 2 g via INTRAVENOUS
  Filled 2023-05-19: qty 100

## 2023-05-19 MED ORDER — PROPOFOL 10 MG/ML IV BOLUS
INTRAVENOUS | Status: AC
  Filled 2023-05-19: qty 20

## 2023-05-19 MED ORDER — DEXAMETHASONE SODIUM PHOSPHATE 10 MG/ML IJ SOLN
INTRAMUSCULAR | Status: DC | PRN
  Administered 2023-05-19: 5 mg via INTRAVENOUS

## 2023-05-19 MED ORDER — 0.9 % SODIUM CHLORIDE (POUR BTL) OPTIME
TOPICAL | Status: DC | PRN
  Administered 2023-05-19: 1000 mL

## 2023-05-19 MED ORDER — LACTATED RINGERS IV SOLN: INTRAVENOUS | Status: DC

## 2023-05-19 MED ORDER — ACETAMINOPHEN 10 MG/ML IV SOLN: 1000.0000 mg | Freq: Once | INTRAVENOUS | Status: DC | PRN

## 2023-05-19 MED ORDER — FENTANYL CITRATE (PF) 100 MCG/2ML IJ SOLN
INTRAMUSCULAR | Status: DC | PRN
  Administered 2023-05-19: 100 ug via INTRAVENOUS

## 2023-05-19 MED ORDER — TRAMADOL HCL 50 MG PO TABS: 50.0000 mg | ORAL_TABLET | Freq: Four times a day (QID) | ORAL | 0 refills | Status: DC | PRN

## 2023-05-19 MED ORDER — FENTANYL CITRATE PF 50 MCG/ML IJ SOSY: 25.0000 ug | PREFILLED_SYRINGE | INTRAMUSCULAR | Status: DC | PRN

## 2023-05-19 MED ORDER — FENTANYL CITRATE (PF) 100 MCG/2ML IJ SOLN
INTRAMUSCULAR | Status: AC
  Filled 2023-05-19: qty 2

## 2023-05-19 MED ORDER — ROCURONIUM BROMIDE 10 MG/ML (PF) SYRINGE
PREFILLED_SYRINGE | INTRAVENOUS | Status: AC
  Filled 2023-05-19: qty 10

## 2023-05-19 MED ORDER — CHLORHEXIDINE GLUCONATE CLOTH 2 % EX PADS: 6.0000 | MEDICATED_PAD | Freq: Once | CUTANEOUS | Status: DC

## 2023-05-19 MED ORDER — ONDANSETRON HCL 4 MG/2ML IJ SOLN
INTRAMUSCULAR | Status: AC
  Filled 2023-05-19: qty 2

## 2023-05-19 MED ORDER — ORAL CARE MOUTH RINSE: 15.0000 mL | Freq: Once | OROMUCOSAL | Status: AC

## 2023-05-19 MED ORDER — PHENYLEPHRINE HCL-NACL 20-0.9 MG/250ML-% IV SOLN
INTRAVENOUS | Status: DC | PRN
  Administered 2023-05-19: 25 ug/min via INTRAVENOUS

## 2023-05-19 MED ORDER — LIDOCAINE 2% (20 MG/ML) 5 ML SYRINGE
INTRAMUSCULAR | Status: DC | PRN
  Administered 2023-05-19: 100 mg via INTRAVENOUS

## 2023-05-19 MED ORDER — BUPIVACAINE HCL 0.25 % IJ SOLN
INTRAMUSCULAR | Status: DC | PRN
  Administered 2023-05-19: 10 mL

## 2023-05-19 MED ORDER — BUPIVACAINE HCL 0.25 % IJ SOLN
INTRAMUSCULAR | Status: AC
  Filled 2023-05-19: qty 1

## 2023-05-19 MED ORDER — HEMOSTATIC AGENTS (NO CHARGE) OPTIME
TOPICAL | Status: DC | PRN
  Administered 2023-05-19: 1 via TOPICAL

## 2023-05-19 MED ORDER — CHLORHEXIDINE GLUCONATE 0.12 % MT SOLN
15.0000 mL | Freq: Once | OROMUCOSAL | Status: AC
  Administered 2023-05-19: 15 mL via OROMUCOSAL

## 2023-05-19 MED ORDER — BUPIVACAINE HCL (PF) 0.25 % IJ SOLN
INTRAMUSCULAR | Status: AC
  Filled 2023-05-19: qty 30

## 2023-05-19 MED ORDER — INSULIN ASPART 100 UNIT/ML IJ SOLN: 0.0000 [IU] | INTRAMUSCULAR | Status: DC | PRN

## 2023-05-19 MED ORDER — DEXAMETHASONE SODIUM PHOSPHATE 10 MG/ML IJ SOLN
INTRAMUSCULAR | Status: AC
  Filled 2023-05-19: qty 1

## 2023-05-19 MED ORDER — ONDANSETRON HCL 4 MG/2ML IJ SOLN
INTRAMUSCULAR | Status: DC | PRN
  Administered 2023-05-19: 4 mg via INTRAVENOUS

## 2023-05-19 MED ORDER — ROCURONIUM BROMIDE 10 MG/ML (PF) SYRINGE
PREFILLED_SYRINGE | INTRAVENOUS | Status: DC | PRN
  Administered 2023-05-19: 70 mg via INTRAVENOUS

## 2023-05-19 MED ORDER — SUGAMMADEX SODIUM 200 MG/2ML IV SOLN
INTRAVENOUS | Status: DC | PRN
  Administered 2023-05-19: 200 mg via INTRAVENOUS

## 2023-05-19 SURGICAL SUPPLY — 30 items
ATTRACTOMAT 16X20 MAGNETIC DRP (DRAPES) ×2 IMPLANT
BAG COUNTER SPONGE SURGICOUNT (BAG) ×2 IMPLANT
BLADE SURG 15 STRL LF DISP TIS (BLADE) ×2 IMPLANT
CHLORAPREP W/TINT 26 (MISCELLANEOUS) ×2 IMPLANT
CLIP TI MEDIUM 6 (CLIP) ×4 IMPLANT
CLIP TI WIDE RED SMALL 6 (CLIP) ×4 IMPLANT
COVER SURGICAL LIGHT HANDLE (MISCELLANEOUS) ×2 IMPLANT
DERMABOND ADVANCED .7 DNX12 (GAUZE/BANDAGES/DRESSINGS) ×2 IMPLANT
DRAPE LAPAROTOMY T 98X78 PEDS (DRAPES) ×2 IMPLANT
DRAPE UTILITY XL STRL (DRAPES) ×2 IMPLANT
ELECT REM PT RETURN 15FT ADLT (MISCELLANEOUS) ×2 IMPLANT
GAUZE 4X4 16PLY ~~LOC~~+RFID DBL (SPONGE) ×2 IMPLANT
GLOVE SURG ORTHO 8.0 STRL STRW (GLOVE) ×2 IMPLANT
GOWN STRL REUS W/ TWL XL LVL3 (GOWN DISPOSABLE) ×6 IMPLANT
HEMOSTAT SURGICEL 2X4 FIBR (HEMOSTASIS) ×2 IMPLANT
ILLUMINATOR WAVEGUIDE N/F (MISCELLANEOUS) IMPLANT
KIT BASIN OR (CUSTOM PROCEDURE TRAY) ×2 IMPLANT
KIT TURNOVER KIT A (KITS) IMPLANT
NDL HYPO 22X1.5 SAFETY MO (MISCELLANEOUS) ×2 IMPLANT
NEEDLE HYPO 22X1.5 SAFETY MO (MISCELLANEOUS) ×1
PACK BASIC VI WITH GOWN DISP (CUSTOM PROCEDURE TRAY) ×2 IMPLANT
PENCIL SMOKE EVACUATOR (MISCELLANEOUS) ×2 IMPLANT
SHEARS HARMONIC 9CM CVD (BLADE) IMPLANT
SUT MNCRL AB 4-0 PS2 18 (SUTURE) ×2 IMPLANT
SUT VIC AB 3-0 SH 18 (SUTURE) ×2 IMPLANT
SYR BULB IRRIG 60ML STRL (SYRINGE) ×2 IMPLANT
SYR CONTROL 10ML LL (SYRINGE) ×2 IMPLANT
TOWEL OR 17X26 10 PK STRL BLUE (TOWEL DISPOSABLE) ×2 IMPLANT
TOWEL OR NON WOVEN STRL DISP B (DISPOSABLE) ×2 IMPLANT
TUBING CONNECTING 10 (TUBING) ×2 IMPLANT

## 2023-05-19 NOTE — Anesthesia Procedure Notes (Signed)
Procedure Name: Intubation Date/Time: 05/19/2023 12:37 PM  Performed by: Elisabeth Cara, CRNAPre-anesthesia Checklist: Patient identified, Emergency Drugs available, Suction available, Patient being monitored and Timeout performed Patient Re-evaluated:Patient Re-evaluated prior to induction Oxygen Delivery Method: Circle system utilized Preoxygenation: Pre-oxygenation with 100% oxygen Induction Type: IV induction Ventilation: Mask ventilation without difficulty Laryngoscope Size: Mac and 4 Grade View: Grade I Tube type: Oral Tube size: 7.5 mm Number of attempts: 1 Airway Equipment and Method: Stylet Placement Confirmation: ETT inserted through vocal cords under direct vision, positive ETCO2 and breath sounds checked- equal and bilateral Secured at: 22 cm Tube secured with: Tape Dental Injury: Teeth and Oropharynx as per pre-operative assessment

## 2023-05-19 NOTE — Transfer of Care (Signed)
Immediate Anesthesia Transfer of Care Note  Patient: Martin Cordova  Procedure(s) Performed: LEFT INFERIOR PARATHYROIDECTOMY (Left)  Patient Location: PACU  Anesthesia Type:General  Level of Consciousness: awake, alert , oriented, and patient cooperative  Airway & Oxygen Therapy: Patient Spontanous Breathing and Patient connected to face mask oxygen  Post-op Assessment: Report given to RN, Post -op Vital signs reviewed and stable, and Patient moving all extremities  Post vital signs: Reviewed and stable  Last Vitals:  Vitals Value Taken Time  BP    Temp    Pulse 65 05/19/23 1348  Resp 16 05/19/23 1348  SpO2 93 % 05/19/23 1348  Vitals shown include unfiled device data.  Last Pain:  Vitals:   05/19/23 1127  TempSrc: Oral  PainSc:       Patients Stated Pain Goal: 3 (05/19/23 1122)  Complications: No notable events documented.

## 2023-05-19 NOTE — Interval H&P Note (Signed)
History and Physical Interval Note:  05/19/2023 11:59 AM  Martin Cordova  has presented today for surgery, with the diagnosis of PRIMARY HYPERPARATHYROIDISM.  The various methods of treatment have been discussed with the patient and family. After consideration of risks, benefits and other options for treatment, the patient has consented to    Procedure(s): LEFT INFERIOR PARATHYROIDECTOMY (Left) as a surgical intervention.    The patient's history has been reviewed, patient examined, no change in status, stable for surgery.  I have reviewed the patient's chart and labs.  Questions were answered to the patient's satisfaction.    Darnell Level, MD [facility] practice Office: [phone]   Darnell Level

## 2023-05-19 NOTE — Discharge Instructions (Addendum)

## 2023-05-19 NOTE — Op Note (Signed)
OPERATIVE REPORT - PARATHYROIDECTOMY  Preoperative diagnosis: Primary hyperparathyroidism  Postop diagnosis: Same  Procedure: Left inferior minimally invasive parathyroidectomy  Surgeon:  Darnell Level, MD  Anesthesia: General endotracheal  Estimated blood loss: Minimal  Preparation: ChloraPrep  Indications: Is referred by Dr. Debara Pickett for surgical evaluation and management of newly diagnosed primary hyperparathyroidism. Patient's primary care physician is Dr. Lynnea Ferrier. Patient's nephrologist is Dr. Sabra Heck. Patient's cardiologist is Dr. Nicki Guadalajara. Patient was noted on routine laboratory studies to have an elevated serum calcium level. His most recent level was 11.0. Intact PTH levels are also elevated with a recent level of 143. 24-hour urine collection for calcium was normal at 107. Patient was referred for nuclear medicine parathyroid scanning on March 21, 2023. This localized a left inferior parathyroid adenoma. USN confirmed a small left inferior adenoma.  Patient now comes to surgery for parathyroidectomy.  Procedure: The patient was prepared in the pre-operative holding area. The patient was brought to the operating room and placed in a supine position on the operating room table. Following administration of general anesthesia, the patient was positioned and then prepped and draped in the usual strict aseptic fashion. After ascertaining that an adequate level of anesthesia been achieved, a neck incision was made with a #15 blade. Dissection was carried through subcutaneous tissues and platysma. Hemostasis was obtained with the electrocautery. Skin flaps were developed circumferentially and a Weitlander retractor was placed for exposure.  Strap muscles were incised in the midline. Strap muscles were reflected laterally exposing the thyroid lobe. With gentle blunt dissection the thyroid lobe was mobilized.  Dissection was carried posteriorly and an enlarged parathyroid gland  was identified. It was gently mobilized. Vascular structures were divided between small ligaclips. Care was taken to avoid the recurrent laryngeal nerve. The parathyroid gland was completely excised. It was submitted to pathology where frozen section confirmed hypercellular parathyroid tissue consistent with adenoma.  Neck was irrigated with warm saline and good hemostasis was noted. Fibrillar was placed in the operative field. Strap muscles were approximated in the midline with interrupted 3-0 Vicryl sutures. Platysma was closed with interrupted 3-0 Vicryl sutures. Marcaine was infiltrated circumferentially. Skin was closed with a running 4-0 Monocryl subcuticular suture. Wound was washed and dried and Dermabond was applied. Patient was awakened from anesthesia and brought to the recovery room. The patient tolerated the procedure well.   Darnell Level, MD [facility] Surgery Office: [phone]

## 2023-05-19 NOTE — Anesthesia Preprocedure Evaluation (Addendum)
Anesthesia Evaluation  Patient identified by MRN, date of birth, ID band Patient awake    Reviewed: Allergy & Precautions, H&P , NPO status , Patient's Chart, lab work & pertinent test results, reviewed documented beta blocker date and time   Airway Mallampati: II  TM Distance: >3 FB Neck ROM: Full    Dental no notable dental hx. (+) Teeth Intact, Dental Advisory Given   Pulmonary sleep apnea and Continuous Positive Airway Pressure Ventilation , former smoker   Pulmonary exam normal breath sounds clear to auscultation       Cardiovascular Exercise Tolerance: Good hypertension, Pt. on medications and Pt. on home beta blockers + dysrhythmias Atrial Fibrillation  Rhythm:Regular Rate:Normal  Echo 2022  1. Left ventricular ejection fraction, by estimation, is 60 to 65%. The left ventricle has normal function. The left ventricle has no regional wall motion abnormalities. There is mild concentric left ventricular hypertrophy. Left ventricular diastolic parameters are consistent with Grade II diastolic dysfunction (pseudonormalization).   2. Right ventricular systolic function is normal. The right ventricular size is normal. There is normal pulmonary artery systolic pressure.   3. Left atrial size was moderately dilated.   4. Right atrial size was mildly dilated.   5. The mitral valve is normal in structure. Trivial mitral valve regurgitation. No evidence of mitral stenosis.   6. The aortic valve is tricuspid. There is mild calcification of the aortic valve. Aortic valve regurgitation is mild. Mild aortic valve sclerosis is present, with no evidence of aortic valve stenosis.   7. The inferior vena cava is normal in size with greater than 50% respiratory variability, suggesting right atrial pressure of 3 mmHg.   Comparison(s): No significant change from prior study.   Conclusion(s)/Recommendation(s): Otherwise normal echocardiogram, with  minor  abnormalities described in the report.     Neuro/Psych negative neurological ROS  negative psych ROS   GI/Hepatic ,GERD  Medicated and Controlled,,(+) Hepatitis -  Endo/Other  diabetes    Renal/GU Renal InsufficiencyRenal disease  negative genitourinary   Musculoskeletal  (+) Arthritis ,    Abdominal   Peds  Hematology negative hematology ROS (+)   Anesthesia Other Findings   Reproductive/Obstetrics negative OB ROS                             Anesthesia Physical Anesthesia Plan  ASA: 3  Anesthesia Plan: General   Post-op Pain Management: Ofirmev IV (intra-op)*   Induction: Intravenous  PONV Risk Score and Plan: 3 and Ondansetron, Midazolam and Dexamethasone  Airway Management Planned: Oral ETT  Additional Equipment:   Intra-op Plan:   Post-operative Plan: Extubation in OR  Informed Consent: I have reviewed the patients History and Physical, chart, labs and discussed the procedure including the risks, benefits and alternatives for the proposed anesthesia with the patient or authorized representative who has indicated his/her understanding and acceptance.     Dental advisory given  Plan Discussed with: CRNA  Anesthesia Plan Comments:         Anesthesia Quick Evaluation

## 2023-05-20 NOTE — Anesthesia Postprocedure Evaluation (Signed)
Anesthesia Post Note  Patient: Martin Cordova  Procedure(s) Performed: LEFT INFERIOR PARATHYROIDECTOMY (Left)     Patient location during evaluation: PACU Anesthesia Type: General Level of consciousness: awake and alert Pain management: pain level controlled Vital Signs Assessment: post-procedure vital signs reviewed and stable Respiratory status: spontaneous breathing, nonlabored ventilation, respiratory function stable and patient connected to nasal cannula oxygen Cardiovascular status: blood pressure returned to baseline and stable Postop Assessment: no apparent nausea or vomiting Anesthetic complications: no   No notable events documented.  Last Vitals:  Vitals:   05/19/23 1415 05/19/23 1428  BP: (!) 158/84 (!) 147/85  Pulse: 65 69  Resp: 13 14  Temp: 36.6 C   SpO2: 94% 93%    Last Pain:  Vitals:   05/19/23 1428  TempSrc:   PainSc: 3                  Shaconda Hajduk P Dartanion Teo

## 2023-05-20 NOTE — Addendum Note (Signed)
Addended by: Nicki Guadalajara A on: 05/20/2023 10:23 AM   Modules accepted: Level of Service

## 2023-05-21 ENCOUNTER — Encounter (HOSPITAL_COMMUNITY): Payer: Self-pay | Admitting: Surgery

## 2023-05-22 DIAGNOSIS — G4733 Obstructive sleep apnea (adult) (pediatric): Secondary | ICD-10-CM | POA: Diagnosis not present

## 2023-05-22 LAB — SURGICAL PATHOLOGY

## 2023-06-08 DIAGNOSIS — N184 Chronic kidney disease, stage 4 (severe): Secondary | ICD-10-CM | POA: Diagnosis not present

## 2023-06-08 DIAGNOSIS — Z9089 Acquired absence of other organs: Secondary | ICD-10-CM | POA: Diagnosis not present

## 2023-06-08 DIAGNOSIS — E210 Primary hyperparathyroidism: Secondary | ICD-10-CM | POA: Diagnosis not present

## 2023-06-08 DIAGNOSIS — Z9889 Other specified postprocedural states: Secondary | ICD-10-CM | POA: Diagnosis not present

## 2023-06-16 DIAGNOSIS — E1122 Type 2 diabetes mellitus with diabetic chronic kidney disease: Secondary | ICD-10-CM | POA: Diagnosis not present

## 2023-06-16 DIAGNOSIS — N184 Chronic kidney disease, stage 4 (severe): Secondary | ICD-10-CM | POA: Diagnosis not present

## 2023-06-16 DIAGNOSIS — I129 Hypertensive chronic kidney disease with stage 1 through stage 4 chronic kidney disease, or unspecified chronic kidney disease: Secondary | ICD-10-CM | POA: Diagnosis not present

## 2023-06-16 DIAGNOSIS — I4891 Unspecified atrial fibrillation: Secondary | ICD-10-CM | POA: Diagnosis not present

## 2023-06-16 DIAGNOSIS — E1129 Type 2 diabetes mellitus with other diabetic kidney complication: Secondary | ICD-10-CM | POA: Diagnosis not present

## 2023-06-16 DIAGNOSIS — N2581 Secondary hyperparathyroidism of renal origin: Secondary | ICD-10-CM | POA: Diagnosis not present

## 2023-06-21 DIAGNOSIS — Z9889 Other specified postprocedural states: Secondary | ICD-10-CM | POA: Insufficient documentation

## 2023-06-22 DIAGNOSIS — G4733 Obstructive sleep apnea (adult) (pediatric): Secondary | ICD-10-CM | POA: Diagnosis not present

## 2023-06-27 ENCOUNTER — Telehealth: Payer: Self-pay | Admitting: *Deleted

## 2023-06-27 NOTE — Telephone Encounter (Signed)
Left message to call back to schedule afib ablation.

## 2023-06-27 NOTE — Telephone Encounter (Signed)
Pt returned my call.  AFib ablation date held for 3/25.  Aware office will be in touch at a later date to review instructions.  Patient verbalized understanding and agreeable to plan.   (Pt will not need CT per MD, arrange TEE instead)

## 2023-07-17 ENCOUNTER — Telehealth: Payer: Self-pay | Admitting: Cardiovascular Disease

## 2023-07-17 NOTE — Telephone Encounter (Signed)
 Patient is calling to schedule his follow up appt with Dr. Loetta Ringer. The first available with Dr. Loetta Ringer is in May. Per recall, "4 Month follow up for sleep. Last visit cpap titration. Patient told to ask for Brandie if there are no appts available". Please advise.

## 2023-07-20 ENCOUNTER — Other Ambulatory Visit: Payer: Self-pay | Admitting: Cardiovascular Disease

## 2023-07-20 DIAGNOSIS — I480 Paroxysmal atrial fibrillation: Secondary | ICD-10-CM

## 2023-07-20 NOTE — Telephone Encounter (Signed)
Prescription refill request for Eliquis received. Indication: PAF Last office visit: 05/17/23  Carleene Mains MD Scr: 2.58 on 05/16/23  Epic Age: 83 Weight: 97kg  Based on above findings Eliquis 2.5mg  twice daily is the appropriate dose.  Refill approved.

## 2023-07-22 ENCOUNTER — Ambulatory Visit
Admission: RE | Admit: 2023-07-22 | Discharge: 2023-07-22 | Disposition: A | Discharge status: Home / Self Care | Payer: Medicare PPO | Source: Ambulatory Visit | Attending: Nurse Practitioner | Admitting: Nurse Practitioner

## 2023-07-22 VITALS — BP 138/71 | HR 68 | Temp 98.4°F | Resp 18

## 2023-07-22 DIAGNOSIS — J019 Acute sinusitis, unspecified: Secondary | ICD-10-CM | POA: Diagnosis not present

## 2023-07-22 DIAGNOSIS — R059 Cough, unspecified: Secondary | ICD-10-CM | POA: Diagnosis not present

## 2023-07-22 MED ORDER — AMOXICILLIN-POT CLAVULANATE 875-125 MG PO TABS: 1.0000 | ORAL_TABLET | Freq: Two times a day (BID) | ORAL | 0 refills | Status: DC

## 2023-07-22 NOTE — ED Provider Notes (Signed)
[facility]    CSN: [id number] Arrival date & time: 07/22/23  0940      History   Chief Complaint Chief Complaint  Patient presents with   Cough    Persistent cold symptoms with productive cough with color change - Entered by patient    HPI Martin Cordova is a 83 y.o. male.   The history is provided by the patient.   Patient presents for complaints of nasal congestion, runny nose, chest congestion, and cough that has been present for the past 2 weeks.  Patient states cough is productive of dark brown sputum.  States that he also experienced chills last evening.  Denies ear pain, ear drainage, wheezing, shortness of breath, chest pain, abdominal pain, nausea, vomiting, diarrhea, or rash.  Patient reports he has been taking over-the-counter cough and cold medications for his symptoms.  Past Medical History:  Diagnosis Date   Allergy    Arthritis    Cataract    Clotting disorder (HCC)    Colon polyps    Diabetes mellitus without complication (HCC)    Diverticulitis    Diverticulosis    GERD 12/07/2006   History of kidney stones    HYPERGLYCEMIA 12/07/2006   HYPERLIPIDEMIA 12/07/2006   mixed wth an atherogenic dyslipidemic pattern   HYPERTENSION 12/07/2006   Nephrolithiasis    Nonalcoholic fatty liver disease    Normal coronary arteries 04/06/2007   by cath EF 55%., last ECHO 10/13/10 EF>55% mild MR, last nuc 06/16/10 EF 57% low risk scan   OBESITY 11/10/2009   OSA treated with BiPAP 10/22/2007   PAF (paroxysmal atrial fibrillation) (HCC) 04/09/2013   Palpitations    tachy   Primary hyperparathyroidism (HCC)    Pulmonary HTN (HCC)    RV syst.  40-70mmHg- moderate by echo   Renal insufficiency    Cr to 3 on ACE   Screening for prostate cancer 01/11/2023   Sleep apnea    SOB (shortness of breath)    with exertion   THROMBOCYTOPENIA 12/07/2006   TRANSAMINASES, SERUM, ELEVATED 12/07/2006   VENTRICULAR TACHYCARDIA 03/27/2007   monitored by dr. Tresa Endo     Patient Active Problem List   Diagnosis Date Noted   Hyperparathyroidism, primary (HCC) 05/08/2023   Screening for prostate cancer 01/11/2023   Chronic kidney disease, stage 3b (HCC) 07/14/2021   Polycystic kidney disease 07/14/2021   Pain due to onychomycosis of toenails of both feet 10/02/2020   Palpitations    Nonalcoholic fatty liver disease    Nephrolithiasis    Diverticulosis    Diverticulitis    Diabetes mellitus without complication (HCC)    Colon polyps    Thrombocytopenia (HCC) 11/16/2015   Anticoagulation adequate 10/23/2015   Trigeminy 05/14/2014   Hyperlipidemia LDL goal <70 04/07/2014   Essential hypertension 04/07/2014   Type 2 diabetes mellitus (HCC) 12/02/2013   History of colonic polyps 11/08/2013   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/08/2013   Atrial flutter (HCC) 07/29/2013   PAF (paroxysmal atrial fibrillation) (HCC) 04/09/2013   Edema 11/05/2012   Pulmonary HTN (HCC)    Renal insufficiency 06/01/2011   Obesity 11/10/2009   OBESITY 11/10/2009   Sleep apnea 10/22/2007   OSA treated with BiPAP 10/22/2007   Normal coronary arteries 04/06/2007   VENTRICULAR TACHYCARDIA 03/27/2007   HYPERLIPIDEMIA 12/07/2006   THROMBOCYTOPENIA 12/07/2006   Essential hypertension 12/07/2006   GERD 12/07/2006   TRANSAMINASES, SERUM, ELEVATED 12/07/2006   Other specified abnormal findings of blood chemistry 12/07/2006    Past Surgical History:  Procedure Laterality Date   A-FLUTTER ABLATION N/A 04/19/2017   Procedure: A-FLUTTER ABLATION;  Surgeon: Duke Salvia, MD;  Location: [facility] INVASIVE CV LAB;  Service: Cardiovascular;  Laterality: N/A;   CARDIAC CATHETERIZATION  04/06/2007   normal cors   CARDIOVERSION N/A 08/05/2013   Procedure: CARDIOVERSION;  Surgeon: Lennette Bihari, MD;  Location: [facility] ENDOSCOPY;  Service: Cardiovascular;  Laterality: N/A;   CARDIOVERSION N/A 12/26/2016   Procedure: CARDIOVERSION;  Surgeon: Lennette Bihari, MD;  Location: [facility] ENDOSCOPY;   Service: Cardiovascular;  Laterality: N/A;   CHOLECYSTECTOMY     CYSTOSCOPY/RETROGRADE/URETEROSCOPY  08/19/2011   Procedure: CYSTOSCOPY/RETROGRADE/URETEROSCOPY;  Surgeon: Lindaann Slough, MD;  Location: [facility];  Service: Urology;  Laterality: Right;  JJ STENT PLACEMENT   EYE SURGERY     PARATHYROIDECTOMY Left 05/19/2023   Procedure: LEFT INFERIOR PARATHYROIDECTOMY;  Surgeon: Darnell Level, MD;  Location: [facility] ORS;  Service: General;  Laterality: Left;   ROTATOR CUFF REPAIR Right        Home Medications    Prior to Admission medications   Medication Sig Start Date End Date Taking? Authorizing Provider  amoxicillin-clavulanate (AUGMENTIN) 875-125 MG tablet Take 1 tablet by mouth every 12 (twelve) hours. 07/22/23  Yes Leath-Warren, Sadie Haber, NP  amiodarone (PACERONE) 200 MG tablet Take 1 tablet (200 mg total) by mouth 2 (two) times daily. 02/23/23   Lennette Bihari, MD  atorvastatin (LIPITOR) 10 MG tablet Take 1 tablet (10 mg total) by mouth daily with breakfast. 09/23/22   Lennette Bihari, MD  carvedilol (COREG) 12.5 MG tablet Take 1 tablet ( 12.5 mg ) in morning and 1/2 tablet ( 6.25 mg ) in pm 03/28/23   Lennette Bihari, MD  ELIQUIS 2.5 MG TABS tablet TAKE (1) TABLET BY MOUTH TWICE DAILY. 07/20/23   Lennette Bihari, MD  empagliflozin (JARDIANCE) 10 MG TABS tablet Take 10 mg by mouth daily.    [provider]  fluticasone (FLONASE) 50 MCG/ACT nasal spray Place 2 sprays into both nostrils daily. Patient taking differently: Place 2 sprays into both nostrils daily as needed for rhinitis. 09/28/21   Donita Brooks, MD  furosemide (LASIX) 40 MG tablet Alternate between taking 40mg  (1 tablet) a day and 20mg  (1/2 tablet) a day. 08/02/22   Lennette Bihari, MD  omega-3 acid ethyl esters (LOVAZA) 1 g capsule TAKE 2 CAPSULES BY MOUTH ONCE DAILY. 08/02/22   Lennette Bihari, MD  OZEMPIC, 0.25 OR 0.5 MG/DOSE, 2 MG/3ML SOPN Inject 0.25 mg into the skin once a week. 11/11/22    [provider]  tadalafil (CIALIS) 5 MG tablet Take 5 mg by mouth every evening.    [provider]    Family History Family History  Problem Relation Age of Onset   Diabetes Mother    Heart failure Mother    Stroke Mother    Kidney failure Father    Diabetes Father    Cancer Paternal Uncle    Colon cancer Neg Hx    Esophageal cancer Neg Hx    Stomach cancer Neg Hx    Rectal cancer Neg Hx     Social History Social History   Tobacco Use   Smoking status: Former    Current packs/day: 0.00    Average packs/day: 0.3 packs/day for 10.0 years (2.5 ttl pk-yrs)    Types: Cigarettes    Start date: 08/05/1970    Quit date: 08/04/1980    Years since quitting: 42.9    Passive  exposure: Never   Smokeless tobacco: Former    Types: Chew    Quit date: 08/08/2004  Vaping Use   Vaping status: Never Used  Substance Use Topics   Alcohol use: Not Currently   Drug use: No     Allergies   Lisinopril, Dulaglutide, and Ibuprofen   Review of Systems Review of Systems Per HPI  Physical Exam Triage Vital Signs ED Triage Vitals  Encounter Vitals Group     BP 07/22/23 1004 (!) 147/69     Systolic BP Percentile --      Diastolic BP Percentile --      Pulse Rate 07/22/23 1004 69     Resp 07/22/23 1004 18     Temp 07/22/23 1004 98.4 F (36.9 C)     Temp Source 07/22/23 1004 Oral     SpO2 07/22/23 1004 91 %     Weight --      Height --      Head Circumference --      Peak Flow --      Pain Score 07/22/23 1006 0     Pain Loc --      Pain Education --      Exclude from Growth Chart --    No data found.  Updated Vital Signs BP 138/71 (BP Location: Right Arm)   Pulse 68   Temp 98.4 F (36.9 C) (Oral)   Resp 18   SpO2 94%   Visual Acuity Right Eye Distance:   Left Eye Distance:   Bilateral Distance:    Right Eye Near:   Left Eye Near:    Bilateral Near:     Physical Exam Vitals and nursing note reviewed.  Constitutional:      General: He is not in  acute distress.    Appearance: Normal appearance.  HENT:     Head: Normocephalic.     Right Ear: Tympanic membrane, ear canal and external ear normal.     Left Ear: Tympanic membrane, ear canal and external ear normal.     Nose: Congestion present.     Right Turbinates: Enlarged and swollen.     Left Turbinates: Enlarged and swollen.     Right Sinus: No maxillary sinus tenderness or frontal sinus tenderness.     Left Sinus: No maxillary sinus tenderness or frontal sinus tenderness.     Mouth/Throat:     Lips: Pink.     Mouth: Mucous membranes are moist.     Pharynx: Uvula midline. Postnasal drip present. No pharyngeal swelling or posterior oropharyngeal erythema.     Comments: Cobblestoning present to posterior oropharynx  Eyes:     Extraocular Movements: Extraocular movements intact.     Conjunctiva/sclera: Conjunctivae normal.     Pupils: Pupils are equal, round, and reactive to light.  Cardiovascular:     Rate and Rhythm: Normal rate and regular rhythm.     Pulses: Normal pulses.     Heart sounds: Normal heart sounds.  Pulmonary:     Effort: Pulmonary effort is normal. No respiratory distress.     Breath sounds: Normal breath sounds. No stridor. No wheezing, rhonchi or rales.  Chest:     Chest wall: No tenderness.  Abdominal:     General: Bowel sounds are normal.     Palpations: Abdomen is soft.     Tenderness: There is no abdominal tenderness.  Musculoskeletal:     Cervical back: Normal range of motion.  Lymphadenopathy:     Cervical: No cervical  adenopathy.  Skin:    General: Skin is warm and dry.  Neurological:     General: No focal deficit present.     Mental Status: He is alert and oriented to person, place, and time.  Psychiatric:        Mood and Affect: Mood normal.        Behavior: Behavior normal.      UC Treatments / Results  Labs (all labs ordered are listed, but only abnormal results are displayed) Labs Reviewed - No data to  display  EKG   Radiology No results found.  Procedures Procedures (including critical care time)  Medications Ordered in UC Medications - No data to display  Initial Impression / Assessment and Plan / UC Course  I have reviewed the triage vital signs and the nursing notes.  Pertinent labs & imaging results that were available during my care of the patient were reviewed by me and considered in my medical decision making (see chart for details).  On exam, lung sounds are clear throughout.  Patient with nasal congestion, runny nose, and cough that is been present for the past 2 weeks with minimal improvement with over-the-counter medications.  Given patient's current symptoms, symptoms are consistent with acute sinusitis.  Will treat with Augmentin 875/125 mg twice daily for the next 7 days.  Supportive care recommendations were provided and discussed with the patient to include fluids, rest, over-the-counter Tylenol, and use of a humidifier at nighttime during sleep.  Discussed indications regarding follow-up.  Patient was in agreement with this plan of care and verbalized understanding.  All questions were answered.  Patient stable for discharge.  Final Clinical Impressions(s) / UC Diagnoses   Final diagnoses:  Acute sinusitis, recurrence not specified, unspecified location  Cough, unspecified type     Discharge Instructions      Take medication as prescribed.  You may continue Mucinex, also recommend over-the-counter Coricidin HBP for your cough.  Continue use of your Flonase, use medication once daily. May take over-the-counter Tylenol as needed for pain, fever, or general discomfort.   Recommend the use of normal saline nasal spray throughout the day for nasal congestion and runny nose. For your cough, it may be helpful to sleep slightly elevated and to use a humidifier in your bedroom at nighttime during sleep while symptoms persist. If symptoms fail to improve with this  treatment, or you develop new symptoms such as wheezing, shortness of breath, difficulty breathing, or other concerns, you may follow-up in this clinic or with your primary care physician for further evaluation. Follow-up as needed.     ED Prescriptions     Medication Sig Dispense Auth. Provider   amoxicillin-clavulanate (AUGMENTIN) 875-125 MG tablet Take 1 tablet by mouth every 12 (twelve) hours. 14 tablet Leath-Warren, Sadie Haber, NP      PDMP not reviewed this encounter.   Abran Cantor, NP 07/22/23 1026

## 2023-07-22 NOTE — ED Triage Notes (Signed)
Cough for several week.  Cough is productive with brown color sputum.  Has taking mucinex with some relief.

## 2023-07-22 NOTE — Discharge Instructions (Addendum)
Take medication as prescribed.  You may continue Mucinex, also recommend over-the-counter Coricidin HBP for your cough.  Continue use of your Flonase, use medication once daily. May take over-the-counter Tylenol as needed for pain, fever, or general discomfort.   Recommend the use of normal saline nasal spray throughout the day for nasal congestion and runny nose. For your cough, it may be helpful to sleep slightly elevated and to use a humidifier in your bedroom at nighttime during sleep while symptoms persist. If symptoms fail to improve with this treatment, or you develop new symptoms such as wheezing, shortness of breath, difficulty breathing, or other concerns, you may follow-up in this clinic or with your primary care physician for further evaluation. Follow-up as needed.

## 2023-07-23 DIAGNOSIS — G4733 Obstructive sleep apnea (adult) (pediatric): Secondary | ICD-10-CM | POA: Diagnosis not present

## 2023-07-27 ENCOUNTER — Other Ambulatory Visit: Payer: Self-pay

## 2023-07-27 DIAGNOSIS — I480 Paroxysmal atrial fibrillation: Secondary | ICD-10-CM

## 2023-07-27 DIAGNOSIS — G4733 Obstructive sleep apnea (adult) (pediatric): Secondary | ICD-10-CM | POA: Diagnosis not present

## 2023-07-31 ENCOUNTER — Ambulatory Visit: Payer: Medicare PPO | Admitting: Family Medicine

## 2023-07-31 ENCOUNTER — Encounter: Payer: Self-pay | Admitting: Family Medicine

## 2023-07-31 VITALS — BP 128/62 | HR 57 | Temp 97.8°F | Ht 71.0 in | Wt 213.2 lb

## 2023-07-31 DIAGNOSIS — J329 Chronic sinusitis, unspecified: Secondary | ICD-10-CM | POA: Diagnosis not present

## 2023-07-31 MED ORDER — CEFDINIR 300 MG PO CAPS: 300.0000 mg | ORAL_CAPSULE | Freq: Two times a day (BID) | ORAL | 0 refills | Status: DC

## 2023-07-31 MED ORDER — PREDNISONE 20 MG PO TABS: ORAL_TABLET | ORAL | 0 refills | Status: DC

## 2023-07-31 NOTE — Progress Notes (Signed)
 Subjective:    Patient ID: Martin Cordova, male    DOB: Mar 16, 1941, 83 y.o.   MRN: [id number]  HPI Patient states that has been sick for more than a month.  He states that he has pressure and congestion in his head.  He reports being stuffed up in his nose and having a difficult time breathing in his nose.  He has been using Flonase.  He has been using Afrin.  He was seen at an urgent care and was treated with Augmentin for 7 days.  Despite taking the Augmentin, he still is audibly congested with swelling and obstruction in his nares.  He denies any sinus pain but does have sinus pressure  Past Medical History:  Diagnosis Date   Allergy    Arthritis    Cataract    Clotting disorder (HCC)    Colon polyps    Diabetes mellitus without complication (HCC)    Diverticulitis    Diverticulosis    GERD 12/07/2006   History of kidney stones    HYPERGLYCEMIA 12/07/2006   HYPERLIPIDEMIA 12/07/2006   mixed wth an atherogenic dyslipidemic pattern   HYPERTENSION 12/07/2006   Nephrolithiasis    Nonalcoholic fatty liver disease    Normal coronary arteries 04/06/2007   by cath EF 55%., last ECHO 10/13/10 EF>55% mild MR, last nuc 06/16/10 EF 57% low risk scan   OBESITY 11/10/2009   OSA treated with BiPAP 10/22/2007   PAF (paroxysmal atrial fibrillation) (HCC) 04/09/2013   Palpitations    tachy   Primary hyperparathyroidism (HCC)    Pulmonary HTN (HCC)    RV syst.  40-49mmHg- moderate by echo   Renal insufficiency    Cr to 3 on ACE   Screening for prostate cancer 01/11/2023   Sleep apnea    SOB (shortness of breath)    with exertion   THROMBOCYTOPENIA 12/07/2006   TRANSAMINASES, SERUM, ELEVATED 12/07/2006   VENTRICULAR TACHYCARDIA 03/27/2007   monitored by dr. Tresa Endo   Past Surgical History:  Procedure Laterality Date   A-FLUTTER ABLATION N/A 04/19/2017   Procedure: A-FLUTTER ABLATION;  Surgeon: Duke Salvia, MD;  Location: [facility] INVASIVE CV LAB;  Service: Cardiovascular;  Laterality:  N/A;   CARDIAC CATHETERIZATION  04/06/2007   normal cors   CARDIOVERSION N/A 08/05/2013   Procedure: CARDIOVERSION;  Surgeon: Lennette Bihari, MD;  Location: [facility] ENDOSCOPY;  Service: Cardiovascular;  Laterality: N/A;   CARDIOVERSION N/A 12/26/2016   Procedure: CARDIOVERSION;  Surgeon: Lennette Bihari, MD;  Location: [facility] ENDOSCOPY;  Service: Cardiovascular;  Laterality: N/A;   CHOLECYSTECTOMY     CYSTOSCOPY/RETROGRADE/URETEROSCOPY  08/19/2011   Procedure: CYSTOSCOPY/RETROGRADE/URETEROSCOPY;  Surgeon: Lindaann Slough, MD;  Location: [facility];  Service: Urology;  Laterality: Right;  JJ STENT PLACEMENT   EYE SURGERY     PARATHYROIDECTOMY Left 05/19/2023   Procedure: LEFT INFERIOR PARATHYROIDECTOMY;  Surgeon: Darnell Level, MD;  Location: [facility] ORS;  Service: General;  Laterality: Left;   ROTATOR CUFF REPAIR Right    Current Outpatient Medications on File Prior to Visit  Medication Sig Dispense Refill   amiodarone (PACERONE) 200 MG tablet Take 1 tablet (200 mg total) by mouth 2 (two) times daily. 90 tablet 3   atorvastatin (LIPITOR) 10 MG tablet Take 1 tablet (10 mg total) by mouth daily with breakfast. 90 tablet 3   carvedilol (COREG) 12.5 MG tablet Take 1 tablet ( 12.5 mg ) in morning and 1/2 tablet ( 6.25 mg ) in pm 135 tablet 3   ELIQUIS  2.5 MG TABS tablet TAKE (1) TABLET BY MOUTH TWICE DAILY. 180 tablet 1   empagliflozin (JARDIANCE) 10 MG TABS tablet Take 10 mg by mouth daily.     fluticasone (FLONASE) 50 MCG/ACT nasal spray Place 2 sprays into both nostrils daily. (Patient taking differently: Place 2 sprays into both nostrils daily as needed for rhinitis.) 16 g 6   furosemide (LASIX) 40 MG tablet Alternate between taking 40mg  (1 tablet) a day and 20mg  (1/2 tablet) a day. 90 tablet 3   omega-3 acid ethyl esters (LOVAZA) 1 g capsule TAKE 2 CAPSULES BY MOUTH ONCE DAILY. 180 capsule 3   OZEMPIC, 0.25 OR 0.5 MG/DOSE, 2 MG/3ML SOPN Inject 0.25 mg into the skin once a week.      tadalafil (CIALIS) 5 MG tablet Take 5 mg by mouth every evening.     amoxicillin-clavulanate (AUGMENTIN) 875-125 MG tablet Take 1 tablet by mouth every 12 (twelve) hours. (Patient not taking: Reported on 07/31/2023) 14 tablet 0   No current facility-administered medications on file prior to visit.   Allergies  Allergen Reactions   Lisinopril Swelling and Other (See Comments)    Renal insufficiency also   Dulaglutide Other (See Comments)   Ibuprofen Other (See Comments)    Per the patient, he has "kidney and liver issues" and is NOT suppose to take this   Social History   Socioeconomic History   Marital status: Married    Spouse name: Not on file   Number of children: 4   Years of education: Not on file   Highest education level: Master's degree (e.g., MA, MS, MEng, MEd, MSW, MBA)  Occupational History   Occupation: retired Runner, broadcasting/film/video  Tobacco Use   Smoking status: Former    Current packs/day: 0.00    Average packs/day: 0.3 packs/day for 10.0 years (2.5 ttl pk-yrs)    Types: Cigarettes    Start date: 08/05/1970    Quit date: 08/04/1980    Years since quitting: 43.0    Passive exposure: Never   Smokeless tobacco: Former    Types: Chew    Quit date: 08/08/2004  Vaping Use   Vaping status: Never Used  Substance and Sexual Activity   Alcohol use: Not Currently   Drug use: No   Sexual activity: Yes  Other Topics Concern   Not on file  Social History Narrative   Not on file   Social Drivers of Health   Financial Resource Strain: Low Risk  (07/28/2023)   Overall Financial Resource Strain (CARDIA)    Difficulty of Paying Living Expenses: Not very hard  Food Insecurity: No Food Insecurity (07/28/2023)   Hunger Vital Sign    Worried About Running Out of Food in the Last Year: Never true    Ran Out of Food in the Last Year: Never true  Transportation Needs: No Transportation Needs (07/28/2023)   PRAPARE - Administrator, Civil Service (Medical): No    Lack of  Transportation (Non-Medical): No  Physical Activity: Inactive (07/28/2023)   Exercise Vital Sign    Days of Exercise per Week: 0 days    Minutes of Exercise per Session: 30 min  Stress: No Stress Concern Present (07/28/2023)   [employer] of Occupational Health - Occupational Stress Questionnaire    Feeling of Stress : Only a little  Social Connections: Socially Integrated (07/28/2023)   Social Connection and Isolation Panel [NHANES]    Frequency of Communication with Friends and Family: More than three times a week  Frequency of Social Gatherings with Friends and Family: More than three times a week    Attends Religious Services: More than 4 times per year    Active Member of [employer] or Organizations: Yes    Attends Engineer, structural: More than 4 times per year    Marital Status: Married  Catering manager Violence: Not At Risk (12/01/2022)   Humiliation, Afraid, Rape, and Kick questionnaire    Fear of Current or Ex-Partner: No    Emotionally Abused: No    Physically Abused: No    Sexually Abused: No   Family History  Problem Relation Age of Onset   Diabetes Mother    Heart failure Mother    Stroke Mother    Kidney failure Father    Diabetes Father    Cancer Paternal Uncle    Colon cancer Neg Hx    Esophageal cancer Neg Hx    Stomach cancer Neg Hx    Rectal cancer Neg Hx      Review of Systems     Objective:   Physical Exam Vitals reviewed.  Constitutional:      General: He is not in acute distress.    Appearance: He is not diaphoretic.  HENT:     Head: Normocephalic and atraumatic.     Right Ear: Tympanic membrane, ear canal and external ear normal.     Left Ear: Tympanic membrane, ear canal and external ear normal.     Nose: Congestion and rhinorrhea present.     Right Turbinates: Enlarged and swollen.     Left Turbinates: Enlarged and swollen.     Right Sinus: No maxillary sinus tenderness or frontal sinus tenderness.     Left Sinus: No  maxillary sinus tenderness or frontal sinus tenderness.     Mouth/Throat:     Pharynx: No oropharyngeal exudate.  Eyes:     General: No scleral icterus.       Right eye: No discharge.        Left eye: No discharge.     Conjunctiva/sclera: Conjunctivae normal.     Pupils: Pupils are equal, round, and reactive to light.  Cardiovascular:     Rate and Rhythm: Normal rate. Rhythm irregular.     Heart sounds: Normal heart sounds. No murmur heard.    No friction rub. No gallop.  Pulmonary:     Effort: Pulmonary effort is normal. No respiratory distress.     Breath sounds: Normal breath sounds. No stridor. No wheezing or rales.  Chest:     Chest wall: No tenderness.  Neurological:     Mental Status: He is alert.           Assessment & Plan:  Rhinosinusitis I believe the patient is dealing with rhinitis medicamentosa.  Discontinue Afrin immediately.  Begin prednisone taper pack coupled Flonase 2 sprays each nostril daily.  Mild 1 week.  If the patient develops pain or pressure in his sinuses he can supplement with Omnicef 300 mg twice daily for 10 days however I believe that the Afrin that he has been using for the last month is likely the culprit at this point

## 2023-08-02 ENCOUNTER — Ambulatory Visit (INDEPENDENT_AMBULATORY_CARE_PROVIDER_SITE_OTHER): Payer: Medicare PPO | Admitting: Podiatry

## 2023-08-02 ENCOUNTER — Encounter: Payer: Self-pay | Admitting: Podiatry

## 2023-08-02 DIAGNOSIS — Z7901 Long term (current) use of anticoagulants: Secondary | ICD-10-CM

## 2023-08-02 DIAGNOSIS — M79674 Pain in right toe(s): Secondary | ICD-10-CM | POA: Diagnosis not present

## 2023-08-02 DIAGNOSIS — M79675 Pain in left toe(s): Secondary | ICD-10-CM | POA: Diagnosis not present

## 2023-08-02 DIAGNOSIS — B351 Tinea unguium: Secondary | ICD-10-CM

## 2023-08-02 DIAGNOSIS — E119 Type 2 diabetes mellitus without complications: Secondary | ICD-10-CM

## 2023-08-02 NOTE — Progress Notes (Signed)
 This patient returns to my office for at risk foot care.  This patient requires this care by a professional since this patient will be at risk due to having diabetes and renal insufficiency and thrombocytopenia..  Patient is taking eliquis.  This patient is unable to cut nails himself since the patient cannot reach his nails.These nails are painful walking and wearing shoes.  This patient presents for at risk foot care today.  General Appearance  Alert, conversant and in no acute stress.  Vascular  Dorsalis pedis and posterior tibial  pulses are palpable  bilaterally.  Capillary return is within normal limits  bilaterally. Temperature is within normal limits  bilaterally.  Neurologic  Senn-Weinstein monofilament wire test within normal limits  bilaterally. Muscle power within normal limits bilaterally.  Nails Thick disfigured discolored nails with subungual debris  from hallux to fifth toes bilaterally. No evidence of bacterial infection or drainage bilaterally.  Orthopedic  No limitations of motion  feet .  No crepitus or effusions noted.  No bony pathology or digital deformities noted.  HAV  B/L.  Skin  normotropic skin with no porokeratosis noted bilaterally.  No signs of infections or ulcers noted.     Onychomycosis  Pain in right toes  Pain in left toes  Consent was obtained for treatment procedures.   Mechanical debridement of nails 1-5  bilaterally performed with a nail nipper.  Filed with dremel without incident.    Return office visit   3 months                   Told patient to return for periodic foot care and evaluation due to potential at risk complications.   Helane Gunther DPM

## 2023-08-03 ENCOUNTER — Telehealth (HOSPITAL_COMMUNITY): Payer: Self-pay

## 2023-08-03 NOTE — Telephone Encounter (Signed)
 Spoke with patient to complete one month pre-procedure call.     New medical conditions?  07/22/23-Diagnosed with Acute Sinusitis- symptoms are resolving. He reports continued nasal congestion. PCP feels this is medication related and prescribed Prednisone taper pack on 07/31/23. Patient made aware to contact office to inform of any new medications started.  Pre-procedure testing scheduled: Lab work ordered Confirmed patient is taking Eliquis and will continue taking medication before procedure or it may need to be rescheduled.  Confirmed patient is scheduled for Atrial Fibrillation Ablation on Tuesday, March 25 with Dr. Loman Brooklyn. Instructed patient to arrive at the Main Entrance A at [facility]: [address] [place], [place] [address] and check in at Admitting at 10:30 AM  Advised of plan to go home the same day and will only stay overnight if medically necessary. You MUST have a responsible adult to drive you home and MUST be with you the first 24 hours after you arrive home or your procedure could be cancelled.  Patient verbalized understanding to information provided and is agreeable to proceed with procedure.

## 2023-08-03 NOTE — Telephone Encounter (Signed)
 Per Dr. Elberta Fortis, If he's off prednisone for a few weeks prior to ablation should be ok

## 2023-08-09 ENCOUNTER — Other Ambulatory Visit: Payer: Self-pay | Admitting: Cardiovascular Disease

## 2023-08-11 DIAGNOSIS — E119 Type 2 diabetes mellitus without complications: Secondary | ICD-10-CM | POA: Diagnosis not present

## 2023-08-11 DIAGNOSIS — Z87442 Personal history of urinary calculi: Secondary | ICD-10-CM | POA: Diagnosis not present

## 2023-08-11 DIAGNOSIS — Q613 Polycystic kidney, unspecified: Secondary | ICD-10-CM | POA: Diagnosis not present

## 2023-08-11 DIAGNOSIS — N184 Chronic kidney disease, stage 4 (severe): Secondary | ICD-10-CM | POA: Diagnosis not present

## 2023-08-11 DIAGNOSIS — Z8639 Personal history of other endocrine, nutritional and metabolic disease: Secondary | ICD-10-CM | POA: Diagnosis not present

## 2023-08-15 DIAGNOSIS — I480 Paroxysmal atrial fibrillation: Secondary | ICD-10-CM | POA: Diagnosis not present

## 2023-08-16 LAB — CBC
Hematocrit: 43.5 % (ref 37.5–51.0)
Hemoglobin: 13.9 g/dL (ref 13.0–17.7)
MCH: 30.8 pg (ref 26.6–33.0)
MCHC: 32 g/dL (ref 31.5–35.7)
MCV: 97 fL (ref 79–97)
Platelets: 86 10*3/uL — CL (ref 150–450)
RBC: 4.51 x10E6/uL (ref 4.14–5.80)
RDW: 13.4 % (ref 11.6–15.4)
WBC: 5.7 10*3/uL (ref 3.4–10.8)

## 2023-08-16 LAB — BASIC METABOLIC PANEL
BUN/Creatinine Ratio: 15 (ref 10–24)
BUN: 31 mg/dL — ABNORMAL HIGH (ref 8–27)
CO2: 23 mmol/L (ref 20–29)
Calcium: 9 mg/dL (ref 8.6–10.2)
Chloride: 109 mmol/L — ABNORMAL HIGH (ref 96–106)
Creatinine, Ser: 2.03 mg/dL — ABNORMAL HIGH (ref 0.76–1.27)
Glucose: 122 mg/dL — ABNORMAL HIGH (ref 70–99)
Potassium: 4.7 mmol/L (ref 3.5–5.2)
Sodium: 144 mmol/L (ref 134–144)
eGFR: 33 mL/min/{1.73_m2} — ABNORMAL LOW (ref >59)

## 2023-08-20 DIAGNOSIS — G4733 Obstructive sleep apnea (adult) (pediatric): Secondary | ICD-10-CM | POA: Diagnosis not present

## 2023-08-20 NOTE — Pre-Procedure Instructions (Signed)
 Patient is scheduled for procedure with anesthesia on 3/25.  Anesthesia requires Ozempic to be held for 7 days before procedure.  He states he takes it on Sundays, he is aware to take dose today, but do not take dose next Sunday 3/23.    He is feeling much better from his sinus infection and is no longer taking the Prednisone.  He is taken Eliquis twice a day and has not missed any doses.

## 2023-08-22 ENCOUNTER — Telehealth (HOSPITAL_COMMUNITY): Payer: Self-pay

## 2023-08-22 NOTE — Telephone Encounter (Signed)
 Call placed to patient to discuss upcoming procedure.   TEE on day of procedure. Labs: completed. Will get CBC redrawn by PCP office on 3/21 to evaluate PLT count.   Any recent signs of acute illness or been started on antibiotics? No. Reports he completed antibiotics and Prednisone 1-2 weeks ago and has fully recovered from sinus infection. Any new medications started? No Any medications to hold? Ozempic- last dose 3/16, Jardiance- last dose 3/21 Any missed doses of blood thinner?  No Advised patient to continue taking ANTICOAGULANT: Eliquis (Apixaban) without missing any doses.  Medication instructions:  On the morning of your procedure DO NOT take any medication., including Eliquis or the procedure may be rescheduled. Nothing to eat or drink after midnight prior to your procedure.  Confirmed patient is scheduled for Atrial Fibrillation Ablation on Tuesday, March 25 with Dr. Loman Brooklyn. Instructed patient to arrive at the Main Entrance A at [facility]: [address] [place], [place] [address] and check in at Admitting at 10:30 AM   Advised of plan to go home the same day and will only stay overnight if medically necessary. You MUST have a responsible adult to drive you home and MUST be with you the first 24 hours after you arrive home or your procedure could be cancelled.  Patient verbalized understanding to all instructions provided and agreed to proceed with procedure.

## 2023-08-25 ENCOUNTER — Other Ambulatory Visit

## 2023-08-25 DIAGNOSIS — I10 Essential (primary) hypertension: Secondary | ICD-10-CM

## 2023-08-25 DIAGNOSIS — E669 Obesity, unspecified: Secondary | ICD-10-CM

## 2023-08-25 DIAGNOSIS — E210 Primary hyperparathyroidism: Secondary | ICD-10-CM

## 2023-08-25 DIAGNOSIS — E1122 Type 2 diabetes mellitus with diabetic chronic kidney disease: Secondary | ICD-10-CM

## 2023-08-25 DIAGNOSIS — K7581 Nonalcoholic steatohepatitis (NASH): Secondary | ICD-10-CM

## 2023-08-25 DIAGNOSIS — D696 Thrombocytopenia, unspecified: Secondary | ICD-10-CM | POA: Diagnosis not present

## 2023-08-25 DIAGNOSIS — E785 Hyperlipidemia, unspecified: Secondary | ICD-10-CM

## 2023-08-25 DIAGNOSIS — N184 Chronic kidney disease, stage 4 (severe): Secondary | ICD-10-CM

## 2023-08-25 DIAGNOSIS — Z125 Encounter for screening for malignant neoplasm of prostate: Secondary | ICD-10-CM

## 2023-08-25 DIAGNOSIS — I272 Pulmonary hypertension, unspecified: Secondary | ICD-10-CM

## 2023-08-25 DIAGNOSIS — Q613 Polycystic kidney, unspecified: Secondary | ICD-10-CM

## 2023-08-25 LAB — CBC WITH DIFFERENTIAL/PLATELET
Absolute Lymphocytes: 615 {cells}/uL — ABNORMAL LOW (ref 850–3900)
Absolute Monocytes: 370 {cells}/uL (ref 200–950)
Basophils Absolute: 22 {cells}/uL (ref 0–200)
Basophils Relative: 0.5 %
Eosinophils Absolute: 181 {cells}/uL (ref 15–500)
Eosinophils Relative: 4.2 %
HCT: 42.7 % (ref 38.5–50.0)
Hemoglobin: 13.6 g/dL (ref 13.2–17.1)
MCH: 30.5 pg (ref 27.0–33.0)
MCHC: 31.9 g/dL — ABNORMAL LOW (ref 32.0–36.0)
MCV: 95.7 fL (ref 80.0–100.0)
MPV: 12.8 fL — ABNORMAL HIGH (ref 7.5–12.5)
Monocytes Relative: 8.6 %
Neutro Abs: 3113 {cells}/uL (ref 1500–7800)
Neutrophils Relative %: 72.4 %
Platelets: 92 10*3/uL — ABNORMAL LOW (ref 140–400)
RBC: 4.46 10*6/uL (ref 4.20–5.80)
RDW: 13.1 % (ref 11.0–15.0)
Total Lymphocyte: 14.3 %
WBC: 4.3 10*3/uL (ref 3.8–10.8)

## 2023-08-28 NOTE — Pre-Procedure Instructions (Signed)
 Instructed patient on the following items: Arrival time 1000 Nothing to eat or drink after midnight No meds AM of procedure Responsible person to drive you home and stay with you for 24 hrs  Have you missed any doses of anti-coagulant Eliquis- takes twice a day, hasn't missed any dose in last 4 weeks. Don't take dose morning of procedue.

## 2023-08-29 ENCOUNTER — Ambulatory Visit (HOSPITAL_BASED_OUTPATIENT_CLINIC_OR_DEPARTMENT_OTHER): Payer: Self-pay | Admitting: Registered Nurse

## 2023-08-29 ENCOUNTER — Encounter (HOSPITAL_COMMUNITY): Payer: Self-pay | Admitting: Cardiology

## 2023-08-29 ENCOUNTER — Ambulatory Visit (HOSPITAL_COMMUNITY)
Admission: RE | Admit: 2023-08-29 | Discharge: 2023-08-29 | Disposition: A | Discharge status: Home / Self Care | Payer: Medicare PPO | Source: Ambulatory Visit | Attending: Cardiology | Admitting: Cardiology

## 2023-08-29 ENCOUNTER — Ambulatory Visit (HOSPITAL_COMMUNITY): Payer: Self-pay | Admitting: Registered Nurse

## 2023-08-29 ENCOUNTER — Ambulatory Visit (HOSPITAL_COMMUNITY)

## 2023-08-29 ENCOUNTER — Other Ambulatory Visit: Payer: Self-pay

## 2023-08-29 ENCOUNTER — Encounter (HOSPITAL_COMMUNITY)
Admission: RE | Disposition: A | Discharge status: Home / Self Care | Payer: Self-pay | Source: Ambulatory Visit | Attending: Cardiology

## 2023-08-29 DIAGNOSIS — I480 Paroxysmal atrial fibrillation: Secondary | ICD-10-CM

## 2023-08-29 DIAGNOSIS — I10 Essential (primary) hypertension: Secondary | ICD-10-CM

## 2023-08-29 DIAGNOSIS — G4730 Sleep apnea, unspecified: Secondary | ICD-10-CM | POA: Diagnosis not present

## 2023-08-29 DIAGNOSIS — E8881 Metabolic syndrome: Secondary | ICD-10-CM | POA: Insufficient documentation

## 2023-08-29 DIAGNOSIS — I4819 Other persistent atrial fibrillation: Secondary | ICD-10-CM | POA: Diagnosis not present

## 2023-08-29 DIAGNOSIS — Z79899 Other long term (current) drug therapy: Secondary | ICD-10-CM | POA: Insufficient documentation

## 2023-08-29 DIAGNOSIS — I4891 Unspecified atrial fibrillation: Secondary | ICD-10-CM

## 2023-08-29 DIAGNOSIS — G4733 Obstructive sleep apnea (adult) (pediatric): Secondary | ICD-10-CM | POA: Diagnosis not present

## 2023-08-29 DIAGNOSIS — E1122 Type 2 diabetes mellitus with diabetic chronic kidney disease: Secondary | ICD-10-CM | POA: Diagnosis not present

## 2023-08-29 DIAGNOSIS — Z6829 Body mass index (BMI) 29.0-29.9, adult: Secondary | ICD-10-CM | POA: Diagnosis not present

## 2023-08-29 DIAGNOSIS — E669 Obesity, unspecified: Secondary | ICD-10-CM | POA: Insufficient documentation

## 2023-08-29 DIAGNOSIS — K219 Gastro-esophageal reflux disease without esophagitis: Secondary | ICD-10-CM | POA: Insufficient documentation

## 2023-08-29 DIAGNOSIS — E119 Type 2 diabetes mellitus without complications: Secondary | ICD-10-CM | POA: Diagnosis not present

## 2023-08-29 DIAGNOSIS — Z7984 Long term (current) use of oral hypoglycemic drugs: Secondary | ICD-10-CM | POA: Insufficient documentation

## 2023-08-29 DIAGNOSIS — I129 Hypertensive chronic kidney disease with stage 1 through stage 4 chronic kidney disease, or unspecified chronic kidney disease: Secondary | ICD-10-CM | POA: Diagnosis not present

## 2023-08-29 DIAGNOSIS — N1832 Chronic kidney disease, stage 3b: Secondary | ICD-10-CM | POA: Diagnosis not present

## 2023-08-29 HISTORY — PX: ATRIAL FIBRILLATION ABLATION: EP1191

## 2023-08-29 LAB — GLUCOSE, CAPILLARY
Glucose-Capillary: 118 mg/dL — ABNORMAL HIGH (ref 70–99)
Glucose-Capillary: 108 mg/dL — ABNORMAL HIGH (ref 70–99)

## 2023-08-29 LAB — POCT ACTIVATED CLOTTING TIME: Activated Clotting Time: 320 s

## 2023-08-29 SURGERY — ATRIAL FIBRILLATION ABLATION
Anesthesia: General

## 2023-08-29 MED ORDER — ONDANSETRON HCL 4 MG/2ML IJ SOLN: 4.0000 mg | Freq: Four times a day (QID) | INTRAMUSCULAR | Status: DC | PRN

## 2023-08-29 MED ORDER — LIDOCAINE 2% (20 MG/ML) 5 ML SYRINGE
INTRAMUSCULAR | Status: DC | PRN
  Administered 2023-08-29: 40 mg via INTRAVENOUS

## 2023-08-29 MED ORDER — PHENYLEPHRINE HCL-NACL 20-0.9 MG/250ML-% IV SOLN
INTRAVENOUS | Status: DC | PRN
  Administered 2023-08-29: 20 ug/min via INTRAVENOUS

## 2023-08-29 MED ORDER — SUGAMMADEX SODIUM 200 MG/2ML IV SOLN
INTRAVENOUS | Status: DC | PRN
  Administered 2023-08-29: 200 mg via INTRAVENOUS

## 2023-08-29 MED ORDER — SODIUM CHLORIDE 0.9 % IV SOLN: INTRAVENOUS | Status: DC

## 2023-08-29 MED ORDER — PROTAMINE SULFATE 10 MG/ML IV SOLN
INTRAVENOUS | Status: DC | PRN
  Administered 2023-08-29: 40 mg via INTRAVENOUS

## 2023-08-29 MED ORDER — PROPOFOL 10 MG/ML IV BOLUS
INTRAVENOUS | Status: DC | PRN
  Administered 2023-08-29: 160 mg via INTRAVENOUS

## 2023-08-29 MED ORDER — PHENYLEPHRINE 80 MCG/ML (10ML) SYRINGE FOR IV PUSH (FOR BLOOD PRESSURE SUPPORT)
PREFILLED_SYRINGE | INTRAVENOUS | Status: DC | PRN
  Administered 2023-08-29: 80 ug via INTRAVENOUS

## 2023-08-29 MED ORDER — ROCURONIUM BROMIDE 10 MG/ML (PF) SYRINGE
PREFILLED_SYRINGE | INTRAVENOUS | Status: DC | PRN
  Administered 2023-08-29: 50 mg via INTRAVENOUS

## 2023-08-29 MED ORDER — FENTANYL CITRATE (PF) 100 MCG/2ML IJ SOLN
INTRAMUSCULAR | Status: AC
  Filled 2023-08-29: qty 2

## 2023-08-29 MED ORDER — ONDANSETRON HCL 4 MG/2ML IJ SOLN
INTRAMUSCULAR | Status: DC | PRN
  Administered 2023-08-29: 4 mg via INTRAVENOUS

## 2023-08-29 MED ORDER — ATROPINE SULFATE 1 MG/ML IV SOLN
INTRAVENOUS | Status: DC | PRN
Start: 2023-08-29 — End: 2023-08-29
  Administered 2023-08-29: 1 mg via INTRAVENOUS

## 2023-08-29 MED ORDER — HEPARIN SODIUM (PORCINE) 1000 UNIT/ML IJ SOLN
INTRAMUSCULAR | Status: DC | PRN
  Administered 2023-08-29: 14000 [IU] via INTRAVENOUS
  Administered 2023-08-29: 2000 [IU] via INTRAVENOUS

## 2023-08-29 MED ORDER — HEPARIN (PORCINE) IN NACL 1000-0.9 UT/500ML-% IV SOLN
INTRAVENOUS | Status: DC | PRN
  Administered 2023-08-29 (×3): 500 mL

## 2023-08-29 MED ORDER — FENTANYL CITRATE (PF) 250 MCG/5ML IJ SOLN
INTRAMUSCULAR | Status: DC | PRN
  Administered 2023-08-29: 50 ug via INTRAVENOUS

## 2023-08-29 SURGICAL SUPPLY — 21 items
BAG SNAP BAND KOVER 36X36 (MISCELLANEOUS) IMPLANT
BLANKET WARM UNDERBOD FULL ACC (MISCELLANEOUS) ×2 IMPLANT
CABLE PFA RX CATH CONN (CABLE) IMPLANT
CATH FARAWAVE ABLATION 31 (CATHETERS) IMPLANT
CATH OCTARAY 2.0 F 3-3-3-3-3 (CATHETERS) IMPLANT
CATH SOUNDSTAR ECO 8FR (CATHETERS) IMPLANT
CATH WEBSTER BI DIR CS D-F CRV (CATHETERS) IMPLANT
CLOSURE MYNX CONTROL 6F/7F (Vascular Products) IMPLANT
CLOSURE PERCLOSE PROSTYLE (VASCULAR PRODUCTS) IMPLANT
COVER SWIFTLINK CONNECTOR (BAG) ×2 IMPLANT
DILATOR VESSEL 38 20CM 16FR (INTRODUCER) IMPLANT
GUIDEWIRE INQWIRE 1.5J.035X260 (WIRE) IMPLANT
INQWIRE 1.5J .035X260CM (WIRE) ×1 IMPLANT
KIT VERSACROSS CNCT FARADRIVE (KITS) IMPLANT
PACK EP LF (CUSTOM PROCEDURE TRAY) ×2 IMPLANT
PAD DEFIB RADIO PHYSIO CONN (PAD) ×2 IMPLANT
PATCH CARTO3 (PAD) IMPLANT
SHEATH FARADRIVE STEERABLE (SHEATH) IMPLANT
SHEATH PINNACLE 8F 10CM (SHEATH) IMPLANT
SHEATH PINNACLE 9F 10CM (SHEATH) IMPLANT
SHEATH PROBE COVER 6X72 (BAG) IMPLANT

## 2023-08-29 NOTE — Progress Notes (Signed)
 Pt ambulated in hall ~100 ft with no signs of oozing from bilateral groin sites

## 2023-08-29 NOTE — Discharge Instructions (Signed)

## 2023-08-29 NOTE — H&P (Signed)
  Electrophysiology Office Note:   Date:  08/29/2023  ID:  Martin Cordova, DOB [birth year], MRN [id number]  Primary Cardiologist: Nicki Guadalajara, MD Primary Heart Failure: None Electrophysiologist: None      History of Present Illness:   Martin Cordova is a 83 y.o. male with h/o obesity, sleep apnea, metabolic syndrome, atrial fibrillation/flutter seen today for  for Electrophysiology evaluation of atrial fibrillation at the request of Daphene Jaeger.   Today, denies symptoms of palpitations, chest pain, shortness of breath, orthopnea, PND, lower extremity edema, claudication, dizziness, presyncope, syncope, bleeding, or neurologic sequela. The patient is tolerating medications without difficulties. Plan ablation today.   EP Information / Studies Reviewed:    EKG is ordered today. Personal review as below.        Risk Assessment/Calculations:    CHA2DS2-VASc Score = 4   This indicates a 4.8% annual risk of stroke. The patient's score is based upon: CHF History: 0 HTN History: 1 Diabetes History: 1 Stroke History: 0 Vascular Disease History: 0 Age Score: 2 Gender Score: 0            Physical Exam:   VS:  BP (!) 167/74   Pulse (!) 59   Temp 98 F (36.7 C) (Oral)   Resp 17   Ht 5\' 11"  (1.803 m)   Wt 96.6 kg   SpO2 96%   BMI 29.71 kg/m    Wt Readings from Last 3 Encounters:  08/29/23 96.6 kg  07/31/23 96.7 kg  05/19/23 97.2 kg    GEN: No acute distress.   Neck: No JVD Cardiac: RRR, no murmurs, rubs, or gallops.  Respiratory: decreased BS bases bilaterally. GI: Soft, nontender, non-distended  MS: No edema; No deformity. Neuro:  Nonfocal  Skin: warm and dry Psych: Normal affect    ASSESSMENT AND PLAN:    1.  Persistent atrial fibrillation: Martin Cordova has presented today for surgery, with the diagnosis of AF.  The various methods of treatment have been discussed with the patient and family. After consideration of risks, benefits and other options for  treatment, the patient has consented to  Procedure(s): Catheter ablation as a surgical intervention .  Risks include but not limited to complete heart block, stroke, esophageal damage, nerve damage, bleeding, vascular damage, tamponade, perforation, MI, and death. The patient's history has been reviewed, patient examined, no change in status, stable for surgery.  I have reviewed the patient's chart and labs.  Questions were answered to the patient's satisfaction.    Mujtaba Bollig Elberta Fortis, MD 08/29/2023 11:11 AM

## 2023-08-29 NOTE — Anesthesia Preprocedure Evaluation (Addendum)
 Anesthesia Evaluation  Patient identified by MRN, date of birth, ID band Patient awake    Reviewed: Allergy & Precautions, NPO status , Patient's Chart, lab work & pertinent test results  Airway Mallampati: II  TM Distance: >3 FB Neck ROM: Full    Dental  (+) Teeth Intact, Dental Advisory Given   Pulmonary sleep apnea , former smoker   breath sounds clear to auscultation       Cardiovascular hypertension, Pt. on home beta blockers and Pt. on medications  Rhythm:Irregular Rate:Normal     Neuro/Psych negative neurological ROS  negative psych ROS   GI/Hepatic ,GERD  ,,(+) Hepatitis -  Endo/Other  diabetes, Type 2, Oral Hypoglycemic Agents    Renal/GU Renal disease     Musculoskeletal  (+) Arthritis ,    Abdominal   Peds  Hematology negative hematology ROS (+)   Anesthesia Other Findings   Reproductive/Obstetrics                             Anesthesia Physical Anesthesia Plan  ASA: 3  Anesthesia Plan: General   Post-op Pain Management: Minimal or no pain anticipated   Induction: Intravenous  PONV Risk Score and Plan: 0  Airway Management Planned: Oral ETT  Additional Equipment: None  Intra-op Plan:   Post-operative Plan: Extubation in OR  Informed Consent: I have reviewed the patients History and Physical, chart, labs and discussed the procedure including the risks, benefits and alternatives for the proposed anesthesia with the patient or authorized representative who has indicated his/her understanding and acceptance.     Dental advisory given  Plan Discussed with: CRNA  Anesthesia Plan Comments:        Anesthesia Quick Evaluation

## 2023-08-29 NOTE — Transfer of Care (Signed)
 Immediate Anesthesia Transfer of Care Note  Patient: Martin Cordova  Procedure(s) Performed: ATRIAL FIBRILLATION ABLATION  Patient Location: PACU  Anesthesia Type:General  Level of Consciousness: drowsy and patient cooperative  Airway & Oxygen Therapy: Patient connected to nasal cannula oxygen  Post-op Assessment: Report given to RN and Post -op Vital signs reviewed and stable  Post vital signs: Reviewed and stable  Last Vitals:  Vitals Value Taken Time  BP 126/61 1316  Temp 98.3   Pulse 60   Resp 10   SpO2 92     Last Pain:  Vitals:   08/29/23 1050  TempSrc: Oral         Complications: There were no known notable events for this encounter.

## 2023-08-29 NOTE — Anesthesia Procedure Notes (Signed)
 Procedure Name: Intubation Date/Time: 08/29/2023 11:53 AM  Performed by: Garfield Cornea, CRNAPre-anesthesia Checklist: Patient identified, Emergency Drugs available, Suction available and Patient being monitored Patient Re-evaluated:Patient Re-evaluated prior to induction Oxygen Delivery Method: Circle System Utilized Preoxygenation: Pre-oxygenation with 100% oxygen Induction Type: IV induction Ventilation: Mask ventilation without difficulty and Oral airway inserted - appropriate to patient size Laryngoscope Size: Mac and 4 Grade View: Grade I Tube type: Oral Tube size: 7.5 mm Number of attempts: 1 Airway Equipment and Method: Stylet and Oral airway Placement Confirmation: ETT inserted through vocal cords under direct vision, positive ETCO2 and breath sounds checked- equal and bilateral Secured at: 23 cm Tube secured with: Tape Dental Injury: Teeth and Oropharynx as per pre-operative assessment

## 2023-08-29 NOTE — Anesthesia Postprocedure Evaluation (Signed)
 Anesthesia Post Note  Patient: Martin Cordova  Procedure(s) Performed: ATRIAL FIBRILLATION ABLATION     Patient location during evaluation: PACU Anesthesia Type: General Level of consciousness: awake and alert Pain management: pain level controlled Vital Signs Assessment: post-procedure vital signs reviewed and stable Respiratory status: spontaneous breathing, nonlabored ventilation, respiratory function stable and patient connected to nasal cannula oxygen Cardiovascular status: blood pressure returned to baseline and stable Postop Assessment: no apparent nausea or vomiting Anesthetic complications: no  There were no known notable events for this encounter.  Last Vitals:  Vitals:   08/29/23 1340 08/29/23 1345  BP: (!) 147/64 (!) 145/65  Pulse: (!) 56 (!) 55  Resp: 11 12  Temp:  36.6 C  SpO2: 95% 94%    Last Pain:  Vitals:   08/29/23 1345  TempSrc: Temporal  PainSc: 0-No pain                 Shelton Silvas

## 2023-08-30 ENCOUNTER — Telehealth (HOSPITAL_COMMUNITY): Payer: Self-pay

## 2023-08-30 ENCOUNTER — Encounter (HOSPITAL_COMMUNITY): Payer: Self-pay | Admitting: Cardiology

## 2023-08-30 NOTE — Telephone Encounter (Signed)
 Spoke with patient to complete post procedure follow up call.  Patient reports no complications with groin sites.   Instructions reviewed with patient:  Remove large bandage at puncture site after 24 hours. It is normal to have bruising, tenderness and a pea or marble sized lump/knot at the groin site which can take up to three months to resolve.  Get help right away if you notice sudden swelling at the puncture site.  Check your puncture site every day for signs of infection: fever, redness, swelling, pus drainage, warmth, foul odor or excessive pain. If this occurs, please call the office at [phone], to speak with the nurse. Get help right away if your puncture site is bleeding and the bleeding does not stop after applying firm pressure to the area.  You may continue to have skipped beats/ atrial fibrillation during the first several months after your procedure.  It is very important not to miss any doses of your blood thinner Eliquis. Patient restarted taking this medication on yesterday, 08/29/23.   You will follow up with the Afib clinic on 09/26/23 and follow up with the APP on 11/30/23.   Patient verbalized understanding to all instructions provided.

## 2023-09-09 ENCOUNTER — Other Ambulatory Visit: Payer: Self-pay | Admitting: Cardiovascular Disease

## 2023-09-20 DIAGNOSIS — G4733 Obstructive sleep apnea (adult) (pediatric): Secondary | ICD-10-CM | POA: Diagnosis not present

## 2023-09-25 ENCOUNTER — Ambulatory Visit: Attending: Cardiovascular Disease | Admitting: Cardiovascular Disease

## 2023-09-25 ENCOUNTER — Encounter: Payer: Self-pay | Admitting: Cardiovascular Disease

## 2023-09-25 VITALS — HR 57 | Ht 71.0 in | Wt 212.0 lb

## 2023-09-25 DIAGNOSIS — E213 Hyperparathyroidism, unspecified: Secondary | ICD-10-CM | POA: Diagnosis not present

## 2023-09-25 DIAGNOSIS — E1122 Type 2 diabetes mellitus with diabetic chronic kidney disease: Secondary | ICD-10-CM

## 2023-09-25 DIAGNOSIS — G4733 Obstructive sleep apnea (adult) (pediatric): Secondary | ICD-10-CM | POA: Diagnosis not present

## 2023-09-25 DIAGNOSIS — E782 Mixed hyperlipidemia: Secondary | ICD-10-CM

## 2023-09-25 DIAGNOSIS — I10 Essential (primary) hypertension: Secondary | ICD-10-CM | POA: Diagnosis not present

## 2023-09-25 DIAGNOSIS — G4739 Other sleep apnea: Secondary | ICD-10-CM

## 2023-09-25 DIAGNOSIS — N184 Chronic kidney disease, stage 4 (severe): Secondary | ICD-10-CM

## 2023-09-25 DIAGNOSIS — I480 Paroxysmal atrial fibrillation: Secondary | ICD-10-CM | POA: Diagnosis not present

## 2023-09-25 NOTE — Progress Notes (Signed)
 Patient ID: Martin Cordova, male   DOB: [birth year], 83 y.o.   MRN: [id number]       HPI: Martin Cordova is a 83 y.o. male is who presents to the office today for a 4 month follow-up cardiology clinic evaluation.  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 In the past, he developed an obstructive uropathy attributed to kidney stones; creatinine had risen up to 3 and ultimately improved and stabilized at approximately 1.7. Martin Cordova daily and does note good sleep.  In the past he had issues with mild peripheral edema, which ultimately improved.  He had been previously taken off his lisinopril  when his creatinine had risen in the setting of his obstructive uropathy.  In 2015 he developed recurrent episodes of palpitations and was found to have recurrent paroxysmal atrial fibrillation/flutter.  His medications were adjusted and  he was started on antiarrhythmic therapy with Rythmol  to take in addition to his increasing doses of carvedilol .  He was started on Eliquis  for anticoagulation.  He underwent successful cardioversion on 08/05/2013.  Since that time, he is unaware of any recurrent atrial fibrillation.  He Cordova noticed more energy. He had been maintained on 50 mg twice a day of carvedilol  in addition to Rythmol  225 mg every 8 hours, but due to slow pulse rates, these doses have ultimately been reduced.  Martin Cordova at 21/17 with an EPAP name of 17 an EPAP max of 21 and a backup rate at 11 breaths per minute. He Cordova been on CPAP therapy initially for approximately 8 years and Cordova been on Cordova auto Cordova for over 4 years. He admits to using his Cordova with 100% compliance.  He obtained a new Cordova machine in June.  He feels that his machine is  significantly improved from his prior one. When I last saw him I reviewed his  download of his new Cordova auto Cordova machine indicates that 90% of the time is EPAP pressure was 17 and 90% of the time his pressure support was 5.8, giving an IPAP of 21.8 cm.  He is using it 100% of the time and is averaging 5 hours and 49 minutes on his most recent download from 12/07/2013 through 01/05/2014.  His average AHI was 7.5.  His device setting maximum Cordova pressure is 25 cm in maximum EPAP pressure 21 cm.  His average central event index is 1.3, and average obstructive apneic index 0.2, with an average copy index of 6.0.  Average breath.  Rate is 18 breaths per minute with a minute ventilation of 12.1.  Average total body may 670 mL.  When I saw him on 10/21/15 he was unaware of any arrhythmia.  He was on carvedilol  12.5 mg twice a day, Rythmol  225 mg twice a day, hydralazine  25 mg twice a day and eliquis   5 mg twice a day.  He also Cordova been taking omeprazole  for GERD and atorvastatin  10 mg for hyperlipidemia. He was found to have recurrent atrial flutter with variable block.  At that time, I recommended that he increase his Rythmol  and take 225 mg every 8 hours and further increased his carvedilol  to 18.75 mg twice a day.  Laboratory had revealed a magnesium  of 2.0.  He Cordova stage III chronic kidney  disease and his BUN was 26, creatinine 1.71 with a GFR estimate of 39.  Potassium was 4.8.  TSH was normal at 2.4.  Insomnia.  One week later in follow-up he had reverted back to sinus rhythm.  When seen in September 2017, he was maintaining sinus rhythm.  Since then, he admits to weight gain.  In early April, he began to notice some increasing shortness of breath and elevated heart rate and fatigue.  He was seen by Martin Cordova on 09/07/2016 and was found to be back in atrial flutter with a ventricular rate at 90.  He Cordova been on eloquence 5 g twice a day for anticoagulation and was on carvedilol  18.75 twice a day.  Carvedilol  dose  was increased to 25 mg twice a day.  He was also told at that time to try increasing his Propofenone to 300 mg 3 times a day.  He took this for several days but did not tolerate the increased dose and reduced it back to its previous dose of 225 mg every 8 hours.  When I saw him on 10/20/2016  I increased carvedilol  to 37.5 mg twice a day.  He Cordova continued to use his Cordova therapy for sleep Cordova.    When I saw him in June 2018 he was still in atrial flutter with 3:1 block.  I discontinued hydralazine  and started him on Cardizem  CD 180 mg, both for blood pressure and potential antiarrhythmic therapy. He also was having ankle edema and I started him on low-dose hydrochlorothiazide .  His peripheral edema significantly improved.  He Cordova felt better.  He denied episodes of chest tightness.  He underwent successful outpatient DC cardioversion by me on 12/26/2016.  He successfully cardioverted at 120 joules.  When he returned for a follow-up evaluation on 01/12/2017 and saw  Martin Cordova, [facility] .  He was back in atrial flutter.  At that time his propafenone  was discontinued.  Over the past several months, Martin Cordova Cordova felt well.  At times there is some trace swelling in his ankles.  He continues to use his Cordova with 100% compliance.  He was hospitalized on September 19 with an episode of diverticulitis and was discharged on 02/24/2017.   When I saw him in October 2018, he was back in atrial flutter.  At that time, I recommended consideration for atrial flutter ablation.  He was evaluated by Dr. Rodolfo Cordova and on 04/19/2017 underwent successful atrial flutter ablation across the caval tricuspid isthmus, which interrupted bidirectional conduction.  Initial plan was for him to continue anticoagulation for 4 weeks, but unfortunately he developed irregular tachycardia palpitations about 48 hours after the procedure.  He's been found to have paroxysmal atrial fibrillation.  He as result is on chronic anticoagulation therapy.   In November 2018 an echo Doppler study showed an EF of 40-45%.  He Cordova been on carvedilol  25 mg twice a day and takes an extra carvedilol  as needed.  He Cordova had recurrent short burst of atrial fibrillation, but had an episode for almost an entire day.  He received a new Cordova machine after his previous machine completely malfunction.  He admits to 100% compliance.  [facility] [employer] is his DME company.  He Cordova diabetes mellitus and was started back on Tradjenta  for hemoglobin A1c of 7.    He Cordova a [employer] Dream Station Cordova auto Cordova unit. I obtained a download from September 02, 2017 through October 01, 2017.  He is compliant.  His average CPAP  pressure was 16.8 and average pressure support pressure was 4.1.  90% of the time device EPAP was 17 cm.  AHI, however, was still mildly increased at 8.9.  He is unaware of breakthrough snoring.  He denies restless legs.  He denies chest pain.  He Cordova noticed some mild episodes of dizziness in the morning.    I recommended some changes to his Cordova therapy and change his ramp time to 5 minutes, increased but pressure support to 5.5 increased his EPAP to 9 cm water pressure.  A download was obtained from June 12 through December 14, 2017.  He is 100% compliant and averaging his hours and 24 minutes of CPAP use per night.  His average AHI is still slightly elevated 8.2.  90% of time device CPAP was 17 cm water pressure.  Though his pressure support range can from 0 to 5.5 cm, he apparently Cordova only been needing a pressure support of 2.4 cm of water.  He feels well.  He Cordova a F&P medium cushion full facemask but he is an oral breather and when he opens his mouth the facemask is actually too small which may be contributing to some of his increased AHI.  He presents to sleep clinic for further evaluation. He had been evaluated by Dr. Rolande Cleverly for his renal issues.  He denies any awareness of recurrent atrial flutter.  He Cordova seen Dr. Cheril Cork here he also Cordova been diagnosed with  monoclonal gammopathy of undetermined significance and is monitored by Dr. Salomon Cree.  He had undergone a cardiac MRI which was ordered by Dr. Rodolfo Cordova last year in February 2019 which showed normal LV size and function with an EF of 54%.  There was no delayed gadolinium uptake on inversion recovery sequences.  He had a trileaflet aortic valve with mild AR, mild MR, moderate RV enlargement, moderate biatrial enlargement and there was moderate LVH with the mid and basal septum measuring 14 mm compared to the posterior wall at 9 mm.  I last saw him in February 2020 at which time he denied any recurrent chest pain or abnormal heart rhythm.  He was using his Cordova auto Cordova with 100% compliance.  A download was obtained in the office from January 26 through July 30, 2018.  He is 100% compliant.  He requires high pressures and his average EPAP pressure was 17 which also was his 90% pressure.  He is set up to a potential maximum IPAP pressure of 30.  His pressure support ranges from 0-5.5 with an average pressure support of 2.4 cm.  He is now using a large mask which is much more comfortable and is he is doing better.  On his most recent download, his AHI was still mildly increased at 7.1/h.  He Cordova undergone follow-up hematologic evaluation with Dr. Delois Ferrier for his monoclonal gammopathy.    I saw him on April 05, 2019.  Since his prior evaluation he felt that he may have had 3 brief short-lived episodes of PAF which resolved spontaneously.  He denied any associated chest pain. He underwent Mohs surgery for a skin cancer on his chin.  He was unable to use Cordova for several days since this is where his mask would rest.  I obtained a download from July 1 through April 04, 2019.  His minimum EPAP pressure was 17 with a maximum IPAP pressure of 30 and pressure support of 5.5 cm.  Apparently his ramp start pressure is 9.5 cm.  Average AHI is 6.9.  90% of the time his device EPAP pressure was 17.  Average breath rate was 16.1  breaths/min.   Echo Doppler study on January 25, 2019 showed an EF of 60 to 65% with mild LVH.  Normal diastolic parameters.  There was moderate aortic sclerosis without stenosis and mild AR.  I saw him on Oct 08, 2019 at which time he felt well.  He is followed by nephrology with stage IV chronic kidney disease with recent creatinine in February 2021 at 2.59.  He was unaware of any recurrent atrial fibrillation or flutter.  He continues to use Cordova auto Cordova therapy with 100% compliance.  He is followed by Dr. Kathyanne Parkers for his diabetes mellitus.   I saw him in November 2021 at which time he remained stable.  He is now followed by Dr. Elnita Hai with the imminent retirement of Dr. Elnita Hai.  Laboratory from April 03, 2020 did show improvement in his creatinine which typically range between 2 and 2.5 and was 1.99.  He denies any significant shortness of breath.  At times he Cordova noticed.  Short brief episodes of possible PAF which self resolved.  His medical regimen Cordova included carvedilol  25 mg twice a day, diltiazem  240 mg daily, furosemide  40 mg for blood pressure and heart rate control.  He is diabetic on Tradjenta .  He Cordova been on Eliquis  without bleeding for anticoagulation and continues to be on low-dose atorvastatin  and lovaza  for hyperlipidemia.  He Cordova a [employer] Cordova auto Cordova machine and I discussed with him the recall with a very small risk of Styrofoam breakdown contributed by possible ozone related cleaning cleansing agents.  He had contacted [employer] and is on a list for a new machine.    I saw him for follow-up evaluation on December 04, 2020 and last saw him 3 weeks later on December 21, 2020.  Since his July 1 evaluation he had noticed several months of increased fatigability as well as some mild leg swelling right greater than left.  He denied any chest pain.  He Cordova continued to use his Cordova ASV.  I obtained a download in the office from June 1 through December 03, 2020 which shows 100% use.  AHI was 5.8  and is 90% EPAP was 17 and 90% pressure support was 3.3 cm.  He denies any chest pain.  He is unaware of rapid palpitations.  During that evaluation, his ECG confirmed that he was in atrial fibrillation with ventricular rate at 79 with right bundle branch block and PVCs.  I suspected that his atrial fibrillation may have been of several months duration and he had been on diltiazem  CD2 140 mg daily, carvedilol  25 mg twice a day, and furosemide  40 mg daily both for rate control, edema and blood pressure control.  With his recurrent atrial fibrillation I recommended institution of amiodarone  and started him on 200 mg twice a day.  I discussed potential adverse consequences of amiodarone  and the importance of monitoring LFTs and thyroid  function.  I scheduled him for laboratory and recommended a follow-up echo Doppler study.  He states he is prescription filled and started his therapy on December 08, 2020.  After several days he seemed to notice slightly more energy.  He is unaware of palpitations.   When I saw him on December 21, 2020, his ECG confirmed that he had converted to sinus rhythm.  At that time, I recommended he continue to take amiodarone  200 mg twice a day with plans to eventually reduce  his dosing.  He underwent a follow-up echo Doppler study on December 23, 2020 which showed normal LV function with EF at 60 to 65%, mild concentric LVH, grade 2 diastolic dysfunction, and there was evidence for moderate left atrial dilatation and mild right atrial dilatation.  There was mild aortic sclerosis without stenosis.  He now sees Dr. Jearldine Mina for his CKD.  At his March 17, 2021 follow-up evaluation he remained stable and denied any chest pain or shortness of breath, dizziness, presyncope or syncope. He is seeing Dr. Cheril Cork.  He is no longer on Tradjenta  but is now on Farxiga for his diabetes mellitus.  He continues to be on Eliquis  5 mg twice a day, carvedilol  25 mg twice a day, diltiazem  240 mg daily as well as  amiodarone  200 mg twice a day.  He Cordova been taking furosemide  40 alternating with 20 mg every other day.  He is on atorvastatin  and Lovaza  for mixed hyperlipidemia.  His ECG revealed sinus bradycardia and I recommended reduction of his amiodarone  dose from 40 mg daily down to 300 mg for 3 weeks and further reduction to 200 mg daily.  He continued to be on carvedilol  25 mg twice a day and long-acting diltiazem  240 mg daily.  He sees Dr. Jearldine Mina for his CKD and in July 2022 creatinine had increased to 2.24.  He continues to use Cordova with 100% compliance.  I saw him on May 19, 2021 at which time he felt well.  Ten days previously  he tested positive for COVID and had mild URI symptoms without fever but did lose his sense of taste for several days.  He presently feels well.  He is unaware of any definitive breakthrough atrial fibrillation but he had noticed transient mild irregularity to heart rhythm last evening.  He continues to be on Eliquis  5 mg twice a day, amiodarone  20 mg daily, carvedilol  25 mg twice a day, diltiazem  240 mg, and Cordova been taking furosemide  40 alternating with 20 mg every other day.  He had undergone recent laboratory on November 11 which showed creatinine at 2.2.  LDL cholesterol in July was 48.  During that evaluation, blood pressure was stable.  He was maintaining sinus rhythm.  I saw him on April 10, 2022 and he continued to do well.   He currently is on amiodarone  200 mg alternating with 300 mg every other day, carvedilol  12.5 mg twice a day, diltiazem  240 mg daily, Eliquis  5 mg twice a day, furosemide  20 mg alternating with 40 mg on alternating days, Jardiance 10 mg, and Lovaza  2 capsules twice a day in addition to atorvastatin  10 mg at bedtime.  He is unaware of recurrent atrial fibrillation.  He continues to see Dr. Kathyanne Parkers for endocrine.  He admits to some fatigability.  He is being followed by Dr. Jearldine Mina of nephrology with creatinine July 2023 at 2.68.  He continues to be on  Cordova with 100% use.  During that evaluation, his blood pressure was elevated and he was bradycardic.  I recommended he reduce carvedilol  to 12.5 mg in the morning and 6.2 5 at night and I initiated low-dose hydralazine  to 12.5 mg twice a day.  I saw him on September 01, 2022.  He was continuing to be followed by Dr. Jearldine Mina of nephrology.  Recently his hydralazine  dose was increased to 25 mg twice a day by Dr. Jearldine Mina.  He is now on amiodarone  200 mg daily, carvedilol  12.5 mg in the morning and 6.25 mg  in the evening, diltiazem  CD2 140 mg daily, furosemide  40 mg alternating with 20 mg every other day, hydralazine  25 mg twice a day, Jardiance 10 mg daily.  He is on atorvastatin  10 mg and Lovaza  2 capsules daily.  He continues to use Cordova auto Cordova and Cordova a [employer] dream ST auto Cordova unit which is not Designer, industrial/product.  He admits to 100% compliance.  He states his current mask is too small.  He brought his machine with him to the office.  He admits to fatigue.  An Epworth Sleepiness Scale score was calculated today and this endorsed at 12 consistent with daytime sleepiness.  I was able to interrogate his machine; usage is 100%.  However average use per day is 4.6 hours.  AHI remains elevated at 25.7/h.  During that evaluation, he was in sinus rhythm at 54 bpm with first-degree AV block.  I provided him with a new ResMed AirTouch F20 large mask instead of his previous medium mask.  I recommended he undergo a Cordova/ASV titration prior to him receiving a new machine.  I saw him on February 23, 2023.  Mr. Antony underwent an new Cordova titration and apparently was titrated up to optimal pressure at 22 over 18 cm of water.  During that study he was not transition to ASV.  Central events were present at 10/6 but were not present at higher Cordova pressures.  He subsequently received a new Cordova air curve 11 via auto unit last week on December 20, 2022.  He can tell the difference that this does not have ASV.  A download was  obtained for his 7 days use which showed average use at 6 hours and 14 minutes.  He is requiring high pressure and his 95th percentile pressure is 22.9/18.9 in the setting is a minimum EPAP of 18 with maximum IPAP of 25.  AHI is elevated at 23.8 of which 12.6 are central events and 10.1 obstructive events.    I last saw him in December 2024.  He tells me he Cordova been in atrial fibrillation most of the month.  His heart rate Cordova been in the low 50s.  He Cordova been taking amiodarone  200 mg alternating with 100 mg every other day, carvedilol  12.5 mg in the morning and 6.25 mg in the evening, diltiazem  240 mg daily, Eliquis  5 mg twice a day, hydralazine  37.5 mg twice a day, furosemide  40 mg, Jardiance 10 mg, atorvastatin  10 mg, Lovaza  2000 mg and is on weekly injections of Ozempic.  During that evaluation, I changed his Cordova therapy and discussed need for possible ASV titration if events continued with Cordova.  I referred him to Dr. Lawana Pray for EP evaluation who he saw yesterday, and there is tentative plan for him to undergo A-fib ablation in several months as the schedule allows.  In the interim, he Cordova been found to have hyperparathyroidism due to a small left inferior adenoma and Cordova been seen by Dr. Sofia Dunn with plans for left inferior parathyroidectomy tomorrow.    Since I last saw him, he underwent left inferior minimally invasive parathyroidectomy on May 19, 2023 by Dr. Oralee Billow.  He also underwent successful atrial fibrillation ablation by Dr. Lawana Pray on August 29, 2023.  He underwent successful electrical isolation and anatomical encircling of all 4 pulmonary veins with pulse field ablation and ablation of the posterior wall with pulsed field ablation.  Presently, he Cordova felt well.  He is unaware of any breakthrough atrial fibrillation.  He Cordova  continued to use his air curve 11 via auto Cordova unit.  Apparently his pressures are set at an IPAP of 24 with minimum EPAP of 18.  He is sleeping better with  his new device.  A download shows 100% usage with average use of 6 hours and 3 minutes.  AHI is elevated at 14.7 with central Cordova at 7.1 and obstructive Cordova at 6.7.  There were some days with mask leak but most days there was no significant leak.  He is sleeping well.  He continues to be on carvedilol  12.5 and morning 6.2 5 at night, furosemide  40 alternating with 20 mg every other day, hydralazine  37.5 mg twice a day, amiodarone  200 mg twice a day and continues to take Lovaza  2 capsules daily and atorvastatin  10 mg.  He presents for evaluation.  Past Medical History  Diagnosis Date   GERD 12/07/2006   HYPERGLYCEMIA 12/07/2006   HYPERLIPIDEMIA 12/07/2006    mixed wth an atherogenic dyslipidemic pattern   HYPERTENSION 12/07/2006   OBESITY 11/10/2009   THROMBOCYTOPENIA 12/07/2006   TRANSAMINASES, SERUM, ELEVATED 12/07/2006   SLEEP Cordova 10/22/2007    uses Cordova   VENTRICULAR TACHYCARDIA 03/27/2007    monitored by dr. Loetta Ringer   Diabetes mellitus without complication    Renal insufficiency     Cr to 3 on ACE   Nephrolithiasis    Normal coronary arteries 04/06/2007    by cath EF 55%., last ECHO 10/13/10 EF>55% mild MR, last nuc 06/16/10 EF 57% low risk scan   Pulmonary HTN     RV syst.  40-43mmHg- moderate by echo   Palpitations     tachy   PAF (paroxysmal atrial fibrillation) 04/09/2013    Past Surgical History  Procedure Laterality Date   Cholecystectomy     Rotator cuff repair     Cystoscopy/retrograde/ureteroscopy  08/19/2011    Procedure: CYSTOSCOPY/RETROGRADE/URETEROSCOPY;  Surgeon: Jinny Mounts, MD;  Location: [facility] [place];  Service: Urology;  Laterality: Right;  JJ STENT PLACEMENT    Cardiac catheterization  04/06/2007    normal cors    Allergies  Allergen Reactions   Lisinopril      Renal insufficiency    Current Outpatient Medications:    amiodarone  (PACERONE ) 200 MG tablet, Take 1 tablet (200 mg total) by mouth 2 (two) times daily., Disp: 90 tablet, Rfl: 3    atorvastatin  (LIPITOR) 10 MG tablet, Take 1 tablet (10 mg total) by mouth daily with breakfast., Disp: 90 tablet, Rfl: 3   carvedilol  (COREG ) 12.5 MG tablet, Take 1 tablet ( 12.5 mg ) in morning and 1/2 tablet ( 6.25 mg ) in pm, Disp: 135 tablet, Rfl: 3   ELIQUIS  2.5 MG TABS tablet, TAKE (1) TABLET BY MOUTH TWICE DAILY., Disp: 180 tablet, Rfl: 1   empagliflozin (JARDIANCE) 10 MG TABS tablet, Take 10 mg by mouth daily., Disp: , Rfl:    fluticasone  (FLONASE ) 50 MCG/ACT nasal spray, Place 2 sprays into both nostrils daily. (Patient taking differently: Place 2 sprays into both nostrils daily as needed for rhinitis.), Disp: 16 g, Rfl: 6   furosemide  (LASIX ) 40 MG tablet, Alternate between taking 40mg  (1 tablet) a day and 20mg  (1/2 tablet) a day., Disp: 90 tablet, Rfl: 3   hydrALAZINE  (APRESOLINE ) 25 MG tablet, Take 1.5 tablets (37.5 mg total) by mouth in the morning and at bedtime., Disp: 270 tablet, Rfl: 0   omega-3 acid ethyl esters (LOVAZA ) 1 g capsule, TAKE 2 CAPSULES BY MOUTH ONCE DAILY., Disp: 180 capsule, Rfl:  1   OZEMPIC, 0.25 OR 0.5 MG/DOSE, 2 MG/3ML SOPN, Inject 0.25 mg into the skin once a week., Disp: , Rfl:    tadalafil (CIALIS) 5 MG tablet, Take 5 mg by mouth every evening., Disp: , Rfl:   Socially he is married Cordova 4 children and 5 grandchildren. He is a distant relative to the "Voshell brothers." He does try to walk and exercise. There is no tobacco use. He does drink occasional alcohol.   ROS General: Negative; No fevers, chills, or night sweats;  HEENT: Negative; No changes in vision or hearing, sinus congestion, difficulty swallowing Pulmonary: Negative; No cough, wheezing, shortness of breath, hemoptysis Cardiovascular: See history of present illness No recent peripheral edema GI: Status post recent episode of diverticulitis requiring 2 day hospitalization GU:  No dysuria, hematuria, or difficulty voiding; some difficulty with erectile function Musculoskeletal: Negative; no  myalgias, joint pain, or weakness Hematologic/Oncology: Positive M spike with monoclonal gammopathy of undetermined significance; history of thrombocytopenia Endocrine: Negative; no heat/cold intolerance; no diabetes Neuro: Negative; no changes in balance, headaches Skin: Negative; No rashes or skin lesions Psychiatric: Negative; No behavioral problems, depression Sleep: See HPI  No snoring, daytime sleepiness, hypersomnolence, bruxism, restless legs, hypnogognic hallucinations, no cataplexy Other comprehensive 14 point system review is negative.  PE Pulse (!) 57   Ht 5\' 11"  (1.803 m)   Wt 212 lb (96.2 kg)   SpO2 95%   BMI 29.57 kg/m    Repeat blood pressure by me was 136/70.  He tells me he did not take his furosemide  today.  Wt Readings from Last 3 Encounters:  09/25/23 212 lb (96.2 kg)  08/29/23 213 lb (96.6 kg)  07/31/23 213 lb 3.2 oz (96.7 kg)   General: Alert, oriented, no distress.  Skin: normal turgor, no rashes, warm and dry HEENT: Normocephalic, atraumatic. Pupils equal round and reactive to light; sclera anicteric; extraocular muscles intact;  Nose without nasal septal hypertrophy Mouth/Parynx benign; Mallinpatti scale 3/4 Neck: No JVD, no carotid bruits; normal carotid upstroke Lungs: clear to ausculatation and percussion; no wheezing or rales Chest wall: without tenderness to palpitation Heart: PMI not displaced, RRR, s1 s2 normal, 1/6 systolic murmur, no diastolic murmur, no rubs, gallops, thrills, or heaves Abdomen: soft, nontender; no hepatosplenomehaly, BS+; abdominal aorta nontender and not dilated by palpation. Back: no CVA tenderness Pulses 2+ Musculoskeletal: full range of motion, normal strength, no joint deformities Extremities: Trace to 1+ bilateral ankle edema;no clubbing cyanosis, Homan's sign negative  Neurologic: grossly nonfocal; Cranial nerves grossly wnl Psychologic: Normal mood and affect      EKG Interpretation Date/Time:  Monday September 25 2023 15:06:05 EDT Ventricular Rate:  56 PR Interval:  220 QRS Duration:  162 QT Interval:  502 QTC Calculation: 484 R Axis:   -51  Text Interpretation: Sinus bradycardia with 1st degree A-V block with occasional Premature ventricular complexes Right bundle branch block Left anterior fascicular block Bifascicular block When compared with ECG of 29-Aug-2023 13:26, Premature ventricular complexes are now Present Confirmed by Magnus Schuller ([id number]) on 09/25/2023 4:20:48 PM    May 18, 2023 ECG (independently read by me): Sinus bradycardia with first-degree AV block, left axis deviation, right bundle branch block.  February 23, 2023 ECG (independently read by me): Atrial fibrillation, right bundle branch block QT interval 494   September 01, 2022 ECG (independently read by me): Sinus bradycardia at 54, 1st degree block, PR 226 msec, RBBB, LAHB   April 11, 2022 ECG (independently read by me): Sinus bradycardia  at 48, RBBB, 1st degree block, PR 246 msec, T wave abnormality  May 19, 2021 ECG (independently read by me): Sinus bradycardiia at 53, RBBB, LAHB  March 17, 2021 ECG (independently read by me): SInus bradycardia at 45, 1st degree AV block, PR 216 msec; RBBB  December 21, 2020 ECG (independently read by me): Sinus bradycardia at 55, 1st degree AV block; PR 228 msec; RBBBB   December 04, 2020 ECG (independently read by me): Atrial fibrillation at 79, RBBB, PVC  May 04, 2020 ECG (independently read by me): Sinus bradycardia at 56, PVC, RBBB  May 4, 2021ECG (independently read by me): Normal sinus rhythm at 63 bpm with premature complex.  Right bundle branch block with repolarization changes.  Normal intervals.  April 05, 2019 ECG (independently read by me): Sinus Bradycardia ay 55: RBBB with repolarization.  February 25,2020 ECG (independently read by me): Sinus bradycardia at 56 bpm.  Right bundle branch block with repolarization changes.  October 03, 2017 ECG (independently read  by me):  Sinus bradycardia at 50 bpm.  Right bundle branch block with repolarization changes. QTc interval 461 ms.  January 2019 ECG (independently read by me): Sinus bradycardia 56 bpm.  Possible left atrial enlargement.  Right bundle branch block with repolarization changes.  PR interval 186 ms; QTc interval 436 ms  October 2018 ECG (independently read by me): Atrial flutter with variable block at 96 bpm.  Right bundle-branch block with repolarization changes.  QTc interval 437 ms  July 2018 ECG (independently read by me): Probable atrial flutter with a ventricular rate at 82 bpm.  Right bundle-branch block, left anterior hemiblock.  11/08/2016 ECG (independently read by me): Probable atrial flutter 3:1 block , ventricular rate at 85;  right bundle branch block with repolarization changes.  QTc interval 528 ms.   May 2018 ECG (independently read by me): atrial flutter with variable block with ventricular rate at 89 bpm.  Right bundle-branch block, left anterior hemiblock.  September 2017 ECG (independently read by me): Normal sinus rhythm at 63 bpm.  First-degree AV block with a PR interval of 208 ms.  Right bundle branch block with repolarization changes.  QTc interval 466 ms.  10/28/15 ECG (independently read by me): Sinus bradycardia at 54 bpm.  First degree block with PR interval 222 ms.  Right bundle-branch block.  10/21/2015 ECG (independently read by me): Atrial flutter with variable block.  Right bundle-branch block with repolarization changes.  October 2016 ECG (independently read by me): sinus rhythm with first-degree AV block with a PR interval at 216 ms.  Ventricular rate 86 bpm with occasional PVCs.    May 2016 ECG (independently read by me): Normal sinus rhythm at 60 bpm.  Right bundle-branch block with repolarization changes.  November 2015 ECG (independently read by me): Normal sinus rhythm at 61.  Nonspecific T abnormality.  QTc interval 394 ms.  11/18/2013 ECG  (independently read by me): Sinus bradycardia 58 beats per minute.  PR interval 198 ms; QTc interval 371 ms.  Nonspecific ST changes.  07/29/2013 ECG  (independently read by me): Atrial flutter with 2:1 block with a ventricular rate of 87 beats per minute. Atrial rate is approximately 360 ms. 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 fib flutter now with right bundle branch block and repolarization changes.  Prior ECG of 06/04/2013: EKG  suggests probable atrial flutter with a ventricular rate of 105 beats per minute. There also are frequent PVCs.  QTc interval is 436 ms. They're nonspecific T changes.  LABS:    Latest Ref Rng & Units 08/15/2023   11:02 AM 05/16/2023    9:04 AM 12/26/2022    8:53 AM  BMP  Glucose 70 - 99 mg/dL 161  84  096   BUN 8 - 27 mg/dL 31  35  34   Creatinine 0.76 - 1.27 mg/dL 0.45  4.09  8.11   BUN/Creat Ratio 10 - 24 15   12    Sodium 134 - 144 mmol/L 144  140  142   Potassium 3.5 - 5.2 mmol/L 4.7  4.0  4.7   Chloride 96 - 106 mmol/L 109  107  109   CO2 20 - 29 mmol/L 23  26  24    Calcium  8.6 - 10.2 mg/dL 9.0  91.4  78.2       Latest Ref Rng & Units 05/16/2023    9:04 AM 12/26/2022    8:53 AM 10/22/2021    9:53 AM  Hepatic Function  Total Protein 6.5 - 8.1 g/dL 6.6  6.6  6.6   Albumin 3.5 - 5.0 g/dL 3.7   4.3   AST 15 - 41 U/L 26  18  22    ALT 0 - 44 U/L 38  27  37   Alk Phosphatase 38 - 126 U/L 52   60   Total Bilirubin <1.2 mg/dL 0.6  0.7  0.6       Latest Ref Rng & Units 08/25/2023   10:08 AM 08/15/2023   11:02 AM 05/16/2023    9:04 AM  CBC  WBC 3.8 - 10.8 Thousand/uL 4.3  5.7  6.3   Hemoglobin 13.2 - 17.1 g/dL 95.6  21.3  08.6   Hematocrit 38.5 - 50.0 % 42.7  43.5  43.6   Platelets 140 - 400 Thousand/uL 92  86  129    Lab Results  Component Value Date   TSH 2.66 12/26/2022   Lipid Panel     Component Value Date/Time   CHOL 113 12/26/2022 0853   TRIG 180 (H) 12/26/2022 0853    HDL 37 (L) 12/26/2022 0853   CHOLHDL 3.1 12/26/2022 0853   VLDL 37 (H) 10/25/2016 0853   LDLCALC 50 12/26/2022 0853   LDLDIRECT 65.0 04/12/2013 0825     RADIOLOGY: No results found.    Patient Name: Martin Cordova, Martin Cordova Date: 09/27/2022 Gender: Male D.O.B: 1940/10/29 Age (years): 82 Referring Provider: Magnus Schuller MD, ABSM Height (inches): 71 Interpreting Physician: Magnus Schuller MD, ABSM Weight (lbs): 227 RPSGT: Iva Mariner BMI: 32 MRN: [id number] Neck Size: 17.00   CLINICAL INFORMATION The patient is referred for a Cordova/ASV titration to treat sleep Cordova.   Patient Cordova been using Cordova Auto Cordova with an old Respironics Dream Station Cordova auto Cordova unit.   SLEEP STUDY TECHNIQUE As per the AASM Manual for the Scoring of Sleep and Associated Events v2.3 (April 2016) with a hypopnea requiring 4% desaturations.   The channels recorded and monitored were frontal, central and occipital EEG, electrooculogram (EOG), submentalis EMG (chin), nasal and oral airflow, thoracic and abdominal wall motion, anterior tibialis EMG, snore microphone, electrocardiogram, and pulse oximetry. Bilevel positive airway pressure (BPAP) was initiated at the beginning of the study and titrated to treat sleep-disordered breathing.   MEDICATIONS amiodarone  (PACERONE ) 200 MG tablet apixaban  (ELIQUIS ) 5 MG TABS tablet atorvastatin  (LIPITOR) 10 MG tablet carvedilol  (COREG ) 12.5 MG tablet diltiazem  (CARDIZEM  CD) 240 MG 24 hr capsule empagliflozin (JARDIANCE) 10 MG TABS  tablet fluticasone  (FLONASE ) 50 MCG/ACT nasal spray furosemide  (LASIX ) 40 MG tablet hydrALAZINE  (APRESOLINE ) 25 MG tablet omega-3 acid ethyl esters (LOVAZA ) 1 g capsule Medications self-administered by patient taken the night of the study : N/A   RESPIRATORY PARAMETERS Optimal IPAP Pressure (cm):22AHI at Optimal Pressure (/hr)0 Optimal EPAP Pressure (cm):            18           Overall Minimal O2 (%):82.0Minimal O2 at Optimal  Pressure (%):86.0   SLEEP ARCHITECTURE Start Time:10:27:34 PMStop Time:4:27:57 AMTotal Time (min):360.4Total Sleep Time (min):219.5 Sleep Latency (min):9.4Sleep Efficiency (%):60.9%REM Latency (min):176.0WASO (min):131.5 Stage N1 (%):25.5%Stage N2 (%):55.8%Stage N3 (%):0.0%Stage R (%):18.7 Supine (%):     92.48   Arousal Index (/hr):     62.6        CARDIAC DATA The 2 lead EKG demonstrated sinus rhythm. The mean heart rate was 52.4 beats per minute. Other EKG findings include: None.   LEG MOVEMENT DATA The total Periodic Limb Movements of Sleep (PLMS) were 0. The PLMS index was 0.0. A PLMS index of <15 is considered normal in adults.   IMPRESSIONS - Cordova was initiated at 10/6 and was titrated to an optimal PAP pressure at 22/18 cm of water. (AHI 0, RDI 0; O2 nadir 86%). He was not transitioned to ASV. - Mild Central Sleep Cordova was noted during this titration (CAI 7.1/h). Central events were present at 10/6 to 13/9 cm, and were not present at higher Cordova pressures.  - Moderate oxygen desaturations were observed during this titration to a nadir of 82% at 15/11 cm. - No snoring was audible during this study. - No cardiac abnormalities were observed during this study. - Clinically significant periodic limb movements were not noted during this study. Arousals associated with PLMs were rare.   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  A Large size Resmed Full Face Mirage Quattro mask was used for the titration. If central events develop he will need a Cordova Auto Cordova device.  - Effort should be made to optimize nasal and oropharyngeal patency. - Avoid alcohol, sedatives and other CNS depressants that may worsen sleep Cordova and disrupt normal sleep architecture. - Sleep hygiene should be reviewed to assess factors that may improve sleep  quality. - Weight management and regular exercise should be initiated or continued. - Recommend a download and turn to slep clinic evaluation after 4-6 weeks of therapy  IMPRESSION:  1. Essential hypertension   2. PAF (paroxysmal atrial fibrillation) (HCC)   3. Obstructive sleep Cordova syndrome   4. Complex sleep Cordova syndrome   5. Type 2 diabetes mellitus with chronic kidney disease, without long-term current use of insulin , unspecified CKD stage (HCC)   6. Mixed hyperlipidemia   7. CKD (chronic kidney disease) stage 4, GFR 15-29 ml/min (HCC)   8. Hyperparathyroidism [facility]): Status post left inferior minimally invasive parathyroidectomy     ASSESSMENT AND PLAN: Mr. Paver is an 83 year-old Caucasian male who Cordova documented normal coronary arteries by cardiac catheterization in 2008. He Cordova a history of hypertension, mixed hyperlipidemia, and Cordova paroxysmal atrial fibrillation/flutter. He Cordova a history of diabetes mellitus and after he had lost a significant amount of weight and with renal insufficiency he was taken off his diabetic medication.  He had been started back on Tradjenta   for his diabetes mellitus but since his most recent evaluation this Cordova been discontinued and he was on Farxiga, and now most recently on Jardiance 10 mg daily. He underwent successful catheter ablation for recurrent atrial flutter in November 2018 and remains on long-term anticoagulation with Eliquis .  Dr Martin Cordova had ordered an MRI of his heart in February 2019 which did not reveal any delayed gadolinium uptake arguing against scar, infiltration or hypertrophic cardiomyopathy.  There was no SAM.  Since his evaluation with me in November 2021, he had noticed more fatigability.  At his office visit on December 04, 2020 ECG confirmed that he was in atrial fibrillation which I suspect was of several months duration.  At the time he already was on carvedilol  25 mg twice a day, Cardizem  CD 240 mg daily, and furosemide  40 mg daily.   I initiated amiodarone  200 mg twice a day which he started on December 08, 2020.   When I saw him several weeks later on December 21, 2020 he felt improved and ECG confirmed that he had not converted to sinus rhythm.  At his evaluation in October 2022 he was maintaining sinus rhythm but was bradycardic with heart rate at 45.  At that time, his dose of amiodarone  was reduced.  In November 2023, he continued to be bradycardic and his carvedilol  dose was reduced.  Blood pressure was elevated and he was initiated on low-dose hydralazine .  Subsequently, his dose Cordova been increased slightly by Dr. Elnita Hai who follows his renal function.  Creatinine was 2.68 in July 2023.  At his evaluation in March 2024 blood pressure remained elevated and hydralazine  was further titrated to 37.5 mg twice a day.  He had been using an old Respironics dream ST auto Cordova unit for his complex sleep Cordova.  His device was no longer downloadable and he continued to have significant events.  I extensively reviewed his most recent sleep evaluation where I had recommended he undergo Cordova and possible ASV titration.  Apparently during that study he was titrated up to 22/18 with excellent response and as result ASV was never initiated.  When seen in September 2024 he had been in and out of atrial fibrillation for the past 3 to 4 weeks.  A download of his new ResMed air curve 11 via auto unit shows moderate residual sleep Cordova with both obstructive and central events with total AHI 23.8 and central AHI at 12.6.  He Cordova only been on his new device for 7 days.  Pressure adjustment  was made to his unit with increasing pressure support to 5.  He believes his current mask is significantly improved from his prior mask.  He had been started on amiodarone  with titration up to 200 mg twice a day.  He also had undergone surgery for hyperparathyroidism performed successfully by Dr. Sofia Dunn.  He underwent successful atrial fibrillation ablation using pulsed field  ablation with isolation of the 4 pulmonary veins and ablation of the posterior wall by Dr. Lawana Pray.  Subsequently, he Cordova been maintaining sinus rhythm.  He is scheduled to undergo A-fib clinic evaluation tomorrow.  He tells me he will most likely be kept on amiodarone  for 3 months following his ablation and I will defer to the A-fib clinic regarding continuation of his present 400 mg dosing or reduction down to 200 mg daily.  Presently he does have ankle edema.  He did not take his furosemide  today.  I also have suggested support stockings.  With reference to his  Cordova, I am changing his parameters to add auto mode.  His minimum EPAP will be 16, pressure support of 6 and maximum IPAP at 25.  Hopefully this will improve his most recent reading.  Remotely he had done well with Cordova auto ASV but apparently he was not switched to this unit during his titration.  He continues to be anticoagulated on Eliquis .  Blood pressure today is stable and on repeat by me was 136/70.  Both he and his wife are aware of my upcoming retirement.  He will have A-fib clinic follow-up evaluation as scheduled with ultimate follow-up with Dr. Lawana Pray.  He continues to be on Jardiance and Ozempic with his diabetes mellitus.  I will transition him to the cardiology care of Dr. Alvis Ba.  I will obtain a download with his Cordova pressure changes in several weeks.  From a sleep perspective, he can be followed by Dr. Micael Adas or another sleep provider as needed.  Okay thank you  Magnus Schuller, MD, [facility], ABSM Diplomate, American Board of Sleep Medicine  09/25/2023  4:41 PM

## 2023-09-25 NOTE — Patient Instructions (Signed)
 Medication Instructions:  NO CHANGES *If you need a refill on your cardiac medications before your next appointment, please call your pharmacy*  Lab Work: NO LABS If you have labs (blood work) drawn today and your tests are completely normal, you will receive your results only by: MyChart Message (if you have MyChart) OR A paper copy in the mail If you have any lab test that is abnormal or we need to change your treatment, we will call you to review the results.  Testing/Procedures: NO TESTING  Follow-Up: At [facility], you and your health needs are our priority.  As part of our continuing mission to provide you with exceptional heart care, our providers are all part of one team.  This team includes your primary Cardiologist (physician) and Advanced Practice Providers or APPs (Physician Assistants and Nurse Practitioners) who all work together to provide you with the care you need, when you need it.  Your next appointment:   6 month(s)  Provider:   Luana Rumple, MD    Other Instructions   1st Floor: - Lobby - Registration  - Pharmacy  - Lab - Cafe  2nd Floor: - PV Lab - Diagnostic Testing (echo, CT, nuclear med)  3rd Floor: - Vacant  4th Floor: - TCTS (cardiothoracic surgery) - AFib Clinic - Structural Heart Clinic - Vascular Surgery  - Vascular Ultrasound  5th Floor: - Heart[facility] Cardiology (general and EP) - Clinical Pharmacy for coumadin, hypertension, lipid, weight-loss medications, and med management appointments    Valet parking services will be available as well.

## 2023-09-26 ENCOUNTER — Encounter (HOSPITAL_COMMUNITY): Payer: Self-pay | Admitting: Physician Assistant

## 2023-09-26 ENCOUNTER — Ambulatory Visit (HOSPITAL_COMMUNITY)
Admission: RE | Admit: 2023-09-26 | Discharge: 2023-09-26 | Disposition: A | Discharge status: Home / Self Care | Source: Ambulatory Visit | Attending: Physician Assistant | Admitting: Physician Assistant

## 2023-09-26 VITALS — BP 142/78 | HR 57 | Ht 71.0 in | Wt 212.2 lb

## 2023-09-26 DIAGNOSIS — Z79899 Other long term (current) drug therapy: Secondary | ICD-10-CM | POA: Diagnosis not present

## 2023-09-26 DIAGNOSIS — I10 Essential (primary) hypertension: Secondary | ICD-10-CM | POA: Diagnosis not present

## 2023-09-26 DIAGNOSIS — Z7901 Long term (current) use of anticoagulants: Secondary | ICD-10-CM | POA: Insufficient documentation

## 2023-09-26 DIAGNOSIS — E785 Hyperlipidemia, unspecified: Secondary | ICD-10-CM | POA: Insufficient documentation

## 2023-09-26 DIAGNOSIS — I4819 Other persistent atrial fibrillation: Secondary | ICD-10-CM | POA: Insufficient documentation

## 2023-09-26 DIAGNOSIS — I4892 Unspecified atrial flutter: Secondary | ICD-10-CM | POA: Insufficient documentation

## 2023-09-26 DIAGNOSIS — G4733 Obstructive sleep apnea (adult) (pediatric): Secondary | ICD-10-CM | POA: Insufficient documentation

## 2023-09-26 DIAGNOSIS — D6869 Other thrombophilia: Secondary | ICD-10-CM | POA: Diagnosis not present

## 2023-09-26 DIAGNOSIS — Z5181 Encounter for therapeutic drug level monitoring: Secondary | ICD-10-CM | POA: Insufficient documentation

## 2023-09-26 NOTE — Progress Notes (Signed)
 Primary Care Physician: Austine Lefort, MD Primary Cardiologist: Magnus Schuller, MD Electrophysiologist: Martin Cortland Ding, MD  Referring Physician: Dr Florencia Hunter Mitten is a 83 y.o. male with a history of OSA, HLD, HTN, atrial flutter, atrial fibrillation who presents for follow up in the [facility].  The patient had a flutter ablation with Dr [name] in 2018. Patient is on Eliquis  for stroke prevention. He had atrial fibrillation and has been maintained on amiodarone . He was seen by Dr Lawana Pray and underwent afib ablation on 08/29/23.   Patient presents today for follow up for atrial fibrillation and amiodarone  monitoring. He reports that he has done well since the ablation. He denies any interim symptoms of afib. He denies any chest pain or groin issues.   Today, he denies symptoms of palpitations, chest pain, shortness of breath, orthopnea, PND, lower extremity edema, dizziness, presyncope, syncope, bleeding, or neurologic sequela. The patient is tolerating medications without difficulties and is otherwise without complaint today.    Atrial Fibrillation Risk Factors:  he does have symptoms or diagnosis of sleep apnea. he does not have a history of rheumatic fever.   Atrial Fibrillation Management history:  Previous antiarrhythmic drugs: amiodarone   Previous cardioversions: 2015, 2018 Previous ablations: flutter 2018, 08/29/23 Anticoagulation history: Eliquis   ROS- All systems are reviewed and negative except as per the HPI above.  Past Medical History:  Diagnosis Date   Allergy    Arthritis    Cataract    Clotting disorder (HCC)    Colon polyps    Diabetes mellitus without complication (HCC)    Diverticulitis    Diverticulosis    GERD 12/07/2006   History of kidney stones    HYPERGLYCEMIA 12/07/2006   HYPERLIPIDEMIA 12/07/2006   mixed wth an atherogenic dyslipidemic pattern   HYPERTENSION 12/07/2006   Nephrolithiasis     Nonalcoholic fatty liver disease    Normal coronary arteries 04/06/2007   by cath EF 55%., last ECHO 10/13/10 EF>55% mild MR, last nuc 06/16/10 EF 57% low risk scan   OBESITY 11/10/2009   OSA treated with BiPAP 10/22/2007   PAF (paroxysmal atrial fibrillation) (HCC) 04/09/2013   Palpitations    tachy   Primary hyperparathyroidism (HCC)    Pulmonary HTN (HCC)    RV syst.  40-21mmHg- moderate by echo   Renal insufficiency    Cr to 3 on ACE   Screening for prostate cancer 01/11/2023   Sleep apnea    SOB (shortness of breath)    with exertion   THROMBOCYTOPENIA 12/07/2006   TRANSAMINASES, SERUM, ELEVATED 12/07/2006   VENTRICULAR TACHYCARDIA 03/27/2007   monitored by dr. Loetta Ringer    Current Outpatient Medications  Medication Sig Dispense Refill   amiodarone  (PACERONE ) 200 MG tablet Take 1 tablet (200 mg total) by mouth 2 (two) times daily. 90 tablet 3   atorvastatin  (LIPITOR) 10 MG tablet Take 1 tablet (10 mg total) by mouth daily with breakfast. 90 tablet 3   carvedilol  (COREG ) 12.5 MG tablet Take 1 tablet ( 12.5 mg ) in morning and 1/2 tablet ( 6.25 mg ) in pm 135 tablet 3   ELIQUIS  2.5 MG TABS tablet TAKE (1) TABLET BY MOUTH TWICE DAILY. 180 tablet 1   empagliflozin (JARDIANCE) 10 MG TABS tablet Take 10 mg by mouth daily.     fluticasone  (FLONASE ) 50 MCG/ACT nasal spray Place 2 sprays into both nostrils daily. (Patient taking differently: Place 2 sprays into both nostrils daily as needed for  rhinitis.) 16 g 6   furosemide  (LASIX ) 40 MG tablet Alternate between taking 40mg  (1 tablet) a day and 20mg  (1/2 tablet) a day. 90 tablet 3   hydrALAZINE  (APRESOLINE ) 25 MG tablet Take 1.5 tablets (37.5 mg total) by mouth in the morning and at bedtime. 270 tablet 0   omega-3 acid ethyl esters (LOVAZA ) 1 g capsule TAKE 2 CAPSULES BY MOUTH ONCE DAILY. 180 capsule 1   OZEMPIC, 0.25 OR 0.5 MG/DOSE, 2 MG/3ML SOPN Inject 0.25 mg into the skin once a week.     tadalafil (CIALIS) 5 MG tablet Take 5 mg by mouth  every evening.     No current facility-administered medications for this encounter.    Physical Exam: BP (!) 142/78   Pulse (!) 57   Ht 5\' 11"  (1.803 m)   Wt 96.3 kg   BMI 29.60 kg/m   GEN: Well nourished, well developed in no acute distress CARDIAC: Regular rate and rhythm, no murmurs, rubs, gallops RESPIRATORY:  Clear to auscultation without rales, wheezing or rhonchi  ABDOMEN: Soft, non-tender, non-distended EXTREMITIES:  No edema; No deformity   Wt Readings from Last 3 Encounters:  09/26/23 96.3 kg  09/25/23 96.2 kg  08/29/23 96.6 kg     EKG today demonstrates  SB, RBBB, LAFB Vent. rate 57 BPM PR interval 190 ms QRS duration 174 ms QT/QTcB 504/490 ms   Echo 12/23/20 demonstrated   1. Left ventricular ejection fraction, by estimation, is 60 to 65%. The  left ventricle has normal function. The left ventricle has no regional  wall motion abnormalities. There is mild concentric left ventricular  hypertrophy. Left ventricular diastolic parameters are consistent with Grade II diastolic dysfunction (pseudonormalization).   2. Right ventricular systolic function is normal. The right ventricular  size is normal. There is normal pulmonary artery systolic pressure.   3. Left atrial size was moderately dilated.   4. Right atrial size was mildly dilated.   5. The mitral valve is normal in structure. Trivial mitral valve  regurgitation. No evidence of mitral stenosis.   6. The aortic valve is tricuspid. There is mild calcification of the  aortic valve. Aortic valve regurgitation is mild. Mild aortic valve  sclerosis is present, with no evidence of aortic valve stenosis.   7. The inferior vena cava is normal in size with greater than 50%  respiratory variability, suggesting right atrial pressure of 3 mmHg.   Comparison(s): No significant change from prior study.   Conclusion(s)/Recommendation(s): Otherwise normal echocardiogram, with  minor abnormalities described in the  report.    CHA2DS2-VASc Score = 4  The patient's score is based upon: CHF History: 0 HTN History: 1 Diabetes History: 1 Stroke History: 0 Vascular Disease History: 0 Age Score: 2 Gender Score: 0       ASSESSMENT AND PLAN: Persistent Atrial Fibrillation/atrial flutter The patient's CHA2DS2-VASc score is 4, indicating a 4.8% annual risk of stroke.   S/p flutter ablation 2018, afib ablation 08/29/23 Patient appears to be maintaining SR Continue amiodarone  200 mg BID for now, anticipate this Martin be short term post ablation. Continue carvedilol  12.5 mg AM and 6.25 mg PM Continue Eliquis  2.5 mg BID (age, Cr)  Secondary Hypercoagulable State (ICD10:  D68.69) The patient is at significant risk for stroke/thromboembolism based upon his CHA2DS2-VASc Score of 4.  Continue Apixaban  (Eliquis ). No bleeding issues.   High Risk Medication Monitoring (ICD 10: Z79.899) Intervals on ECG acceptable for amiodarone  monitoring.   HTN Stable on current regimen  OSA  Encouraged nightly BiPap Followed by Dr Loetta Ringer   Follow up with Michaelle Adolphus as scheduled.        Myrtha Ates PA-C Afib Clinic [facility] [address] [place], [place] [address] [phone]

## 2023-09-28 ENCOUNTER — Other Ambulatory Visit: Payer: Self-pay | Admitting: Cardiovascular Disease

## 2023-10-03 DIAGNOSIS — N184 Chronic kidney disease, stage 4 (severe): Secondary | ICD-10-CM | POA: Diagnosis not present

## 2023-10-10 DIAGNOSIS — E1122 Type 2 diabetes mellitus with diabetic chronic kidney disease: Secondary | ICD-10-CM | POA: Diagnosis not present

## 2023-10-10 DIAGNOSIS — N184 Chronic kidney disease, stage 4 (severe): Secondary | ICD-10-CM | POA: Diagnosis not present

## 2023-10-10 DIAGNOSIS — I129 Hypertensive chronic kidney disease with stage 1 through stage 4 chronic kidney disease, or unspecified chronic kidney disease: Secondary | ICD-10-CM | POA: Diagnosis not present

## 2023-10-10 DIAGNOSIS — I4891 Unspecified atrial fibrillation: Secondary | ICD-10-CM | POA: Diagnosis not present

## 2023-10-10 DIAGNOSIS — N2581 Secondary hyperparathyroidism of renal origin: Secondary | ICD-10-CM | POA: Diagnosis not present

## 2023-10-13 ENCOUNTER — Ambulatory Visit: Admitting: Family Medicine

## 2023-10-13 ENCOUNTER — Encounter: Payer: Self-pay | Admitting: Family Medicine

## 2023-10-13 ENCOUNTER — Ambulatory Visit: Payer: Self-pay | Admitting: *Deleted

## 2023-10-13 VITALS — BP 122/80 | HR 57 | Temp 98.5°F | Ht 71.0 in | Wt 216.2 lb

## 2023-10-13 DIAGNOSIS — W57XXXA Bitten or stung by nonvenomous insect and other nonvenomous arthropods, initial encounter: Secondary | ICD-10-CM | POA: Diagnosis not present

## 2023-10-13 DIAGNOSIS — S30861A Insect bite (nonvenomous) of abdominal wall, initial encounter: Secondary | ICD-10-CM | POA: Diagnosis not present

## 2023-10-13 MED ORDER — DOXYCYCLINE HYCLATE 100 MG PO TABS: 100.0000 mg | ORAL_TABLET | Freq: Two times a day (BID) | ORAL | 0 refills | Status: DC

## 2023-10-13 NOTE — Telephone Encounter (Signed)
 Copied from CRM [id number]. Topic: Clinical - Red Word Triage >> Oct 13, 2023  8:02 AM Juluis Ok wrote: [facility] that prompted transfer to Nurse Triage: tick bite-Rt leg with redness and swelling Reason for Disposition  [1] Red or very tender (to touch) area AND [2] started over 24 hours after the bite  Answer Assessment - Initial Assessment Questions 1. ATTACHED:  "Is the tick still on the skin?"  (e.g., yes, no, unsure)     NSET - TICK STILL ATTACHED:  "How long do you think the tick has been on your skin?" (e.g., hours, days, unsure)  Note:  Is there a recent activity (camping, hiking) where the caller may have been exposed?     No  3. ONSET - TICK NOT STILL ATTACHED: "If the tick has been removed, how long do you think the tick was attached before you removed it?" (e.g., 5 hours, 2 days). "When was this?"     It was removed 4. LOCATION: "Where is the tick bite located?" (e.g., arm, leg)     Right upper leg on thigh. 5. TYPE of TICK: "Is it a wood tick or a deer tick?" (e.g., deer tick, wood tick; unsure)     It was a tiny 6. SIZE of TICK: "How big is the tick?" (e.g., size of poppy seed, apple seed, watermelon seed; unsure) Note: Deer ticks can be the size of a poppy seed (nymph) or an apple seed (adult).       It was tiny 7. ENGORGED: "Did the tick look flat or engorged (full, swollen)?" (e.g., flat, engorged; unsure)     flat 8. OTHER SYMPTOMS: "Do you have any other symptoms?" (e.g., fever, rash, redness at bite area, red ring around bite)     Yes redness and spreading.   I'm always out in the yard mowing and working.     9. PREGNANCY: "Is there any chance you are pregnant?" "When was your last menstrual period?"     N/A  Protocols used: Tick Bite-A-AH  Chief Complaint: Tick bite is red and spreading with a little pus on top Symptoms: Tick removed Tues from right upper leg. Frequency: above Pertinent Negatives: Patient denies fever or headaches Disposition: [] ED /[] Urgent Care (no  appt availability in office) / [x] Appointment(In office/virtual)/ []  [place] [facility]/ [] Home Care/ [] Refused Recommended Disposition /[] [place] [facility]/ []  Follow-up with PCP Additional Notes: Appt made for today at 2:00 with Dr. Amadeo June.

## 2023-10-13 NOTE — Progress Notes (Signed)
 Patient Office Visit  Assessment & Plan:  Tick bite of groin, initial encounter -     Doxycycline Hyclate; Take 1 tablet (100 mg total) by mouth 2 (two) times daily.  Dispense: 20 tablet; Refill: 0   Patient will see if rash is worse tomorrow and if so will start Doxy.  No follow-ups on file.   Subjective:    Patient ID: Martin Cordova, male    DOB: Nov 15, 1940  Age: 83 y.o. MRN: [id number]  Chief Complaint  Patient presents with   Insect Bite    Pt found a tick on him on Tuesday. He has pulled the tick off. It was located on his R groin area.     HPI Discussed the use of AI scribe software for clinical note transcription with the patient, who gave verbal consent to proceed.  History of Present Illness   Martin Cordova is an 83 year old male with atrial fibrillation and stage 4 kidney disease who presents with a tick bite and redness in the groin area.  He discovered a tick in his groin area on Tuesday and removed it using alcohol. The area has since become increasingly red, though it lacks a bull's eye appearance. He is concerned about the worsening redness and describes a hard knot in the middle of the affected area. He applied antibiotic cream this morning, which he has done for similar bites in the past, but notes this one is more swollen than previous bites. He mentions a tiny dot of pus was present, which may have been rubbed off by his pants.  He has had tick bites before and usually removes them himself or with the help of his husband, who frequently finds ticks due to spending a lot of time outdoors. 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  No fever, chills, flu-like symptoms, headaches, or light sensitivity.     Physical Exam CARDIOVASCULAR: Regular heart rhythm. GENITOURINARY: Redness in groin area, 6 cm by 3 cm, with hard knot in  the middle, tender to touch. Results LABS Cr: 2.0 (08/2023) Cr: 2.89 (05/2023) Assessment & Plan Tick bite with localized redness Tick bite in thigh/groin with localized redness, no systemic symptoms. Differential includes Lyme disease. Doxycycline chosen for prophylaxis. - Prescribed doxycycline for Lyme disease prophylaxis. - Advised sunscreen and hat use to prevent photosensitivity from doxycycline. - Instructed to monitor redness increased in size and report if it increases beyond 6 cm by 3 cm. - Advised to report fever, chills, flu-like symptoms, or headaches immediately.  The ASCVD Risk score (Arnett DK, et al., 2019) failed to calculate for the following reasons:   The 2019 ASCVD risk score is only valid for ages 45 to 43  Past Medical History:  Diagnosis Date   Allergy    Arthritis    Cataract    Clotting disorder (HCC)    Colon polyps    Diabetes mellitus without complication (HCC)    Diverticulitis    Diverticulosis    GERD 12/07/2006   History of kidney stones    HYPERGLYCEMIA 12/07/2006   HYPERLIPIDEMIA 12/07/2006   mixed wth an atherogenic dyslipidemic pattern   HYPERTENSION 12/07/2006   Nephrolithiasis    Nonalcoholic fatty liver disease    Normal coronary arteries 04/06/2007   by cath EF 55%., last ECHO 10/13/10 EF>55% mild MR, last nuc 06/16/10 EF 57% low risk  scan   OBESITY 11/10/2009   OSA treated with BiPAP 10/22/2007   PAF (paroxysmal atrial fibrillation) (HCC) 04/09/2013   Palpitations    tachy   Primary hyperparathyroidism (HCC)    Pulmonary HTN (HCC)    RV syst.  40-59mmHg- moderate by echo   Renal insufficiency    Cr to 3 on ACE   Screening for prostate cancer 01/11/2023   Sleep apnea    SOB (shortness of breath)    with exertion   THROMBOCYTOPENIA 12/07/2006   TRANSAMINASES, SERUM, ELEVATED 12/07/2006   VENTRICULAR TACHYCARDIA 03/27/2007   monitored by dr. Loetta Ringer   Past Surgical History:  Procedure Laterality Date   A-FLUTTER ABLATION N/A  04/19/2017   Procedure: A-FLUTTER ABLATION;  Surgeon: Verona Goodwill, MD;  Location: [facility] INVASIVE CV LAB;  Service: Cardiovascular;  Laterality: N/A;   ATRIAL FIBRILLATION ABLATION N/A 08/29/2023   Procedure: ATRIAL FIBRILLATION ABLATION;  Surgeon: Lei Pump, MD;  Location: [facility] INVASIVE CV LAB;  Service: Cardiovascular;  Laterality: N/A;   CARDIAC CATHETERIZATION  04/06/2007   normal cors   CARDIOVERSION N/A 08/05/2013   Procedure: CARDIOVERSION;  Surgeon: Millicent Ally, MD;  Location: [facility] ENDOSCOPY;  Service: Cardiovascular;  Laterality: N/A;   CARDIOVERSION N/A 12/26/2016   Procedure: CARDIOVERSION;  Surgeon: Millicent Ally, MD;  [facility] ENDOSCOPY;  Service: Cardiovascular;  Laterality: N/A;   CHOLECYSTECTOMY     CYSTOSCOPY/RETROGRADE/URETEROSCOPY  08/19/2011   Procedure: CYSTOSCOPY/RETROGRADE/URETEROSCOPY;  Surgeon: Jinny Mounts, MD;  Location[facility];  Service: Urology;  Laterality: Right;  JJ STENT PLACEMENT   EYE SURGERY     PARATHYROIDECTOMY Left 05/19/2023   Procedure: LEFT INFERIOR PARATHYROIDECTOMY;  Surgeon: Oralee Billow, MD;  Location: [facility] ORS;  Service: General;  Laterality: Left;   ROTATOR CUFF REPAIR Right    Social History   Tobacco Use   Smoking status: Former    Current packs/day: 0.00    Average packs/day: 0.3 packs/day for 10.0 years (2.5 ttl pk-yrs)    Types: Cigarettes    Start date: 08/05/1970    Quit date: 08/04/1980    Years since quitting: 43.2    Passive exposure: Never   Smokeless tobacco: Former    Types: Chew    Quit date: 08/08/2004   Tobacco comments:    Former smoker 09/26/23  Vaping Use   Vaping status: Never Used  Substance Use Topics   Alcohol use: Not Currently   Drug use: No   Family History  Problem Relation Age of Onset   Diabetes Mother    Heart failure Mother    Stroke Mother    Kidney failure Father    Diabetes Father    Cancer Paternal Uncle    Colon cancer Neg Hx    Esophageal cancer Neg Hx     Stomach cancer Neg Hx    Rectal cancer Neg Hx    Allergies  Allergen Reactions   Lisinopril  Swelling and Other (See Comments)    Renal insufficiency also   Trulicity [Dulaglutide] Other (See Comments)    Bloating    Ibuprofen Other (See Comments)    Per the patient, he has "kidney and liver issues" and is NOT suppose to take this    ROS    Objective:    BP 122/80   Pulse (!) 57   Temp 98.5 F (36.9 C)   Ht 5\' 11"  (1.803 m)   Wt 216 lb 4 oz (98.1 kg)   SpO2 98%   BMI 30.16 kg/m  BP Readings from Last 3 Encounters:  10/13/23 122/80  09/26/23 (!) 142/78  08/29/23 (!) 148/72   Wt Readings from Last 3 Encounters:  10/13/23 216 lb 4 oz (98.1 kg)  09/26/23 212 lb 3.2 oz (96.3 kg)  09/25/23 212 lb (96.2 kg)    Physical Exam Vitals and nursing note reviewed.  Constitutional:      Appearance: Normal appearance.  HENT:     Head: Normocephalic.     Ears:     Comments: Has hearing aids Eyes:     Extraocular Movements: Extraocular movements intact.     Pupils: Pupils are equal, round, and reactive to light.  Cardiovascular:     Rate and Rhythm: Normal rate and regular rhythm.     Heart sounds: Normal heart sounds.  Pulmonary:     Effort: Pulmonary effort is normal.     Breath sounds: Normal breath sounds.  Skin:    Findings: Erythema and rash present.     Comments: Right thigh/near groin area erythematous rash 6 cm by 3 cm, induration in the middle, no purulence  Neurological:     General: No focal deficit present.     Mental Status: He is alert and oriented to person, place, and time.  Psychiatric:        Mood and Affect: Mood normal.        Behavior: Behavior normal.      No results found for any visits on 10/13/23.

## 2023-10-20 DIAGNOSIS — G4733 Obstructive sleep apnea (adult) (pediatric): Secondary | ICD-10-CM | POA: Diagnosis not present

## 2023-11-01 ENCOUNTER — Encounter: Payer: Self-pay | Admitting: Podiatry

## 2023-11-01 ENCOUNTER — Ambulatory Visit: Payer: Medicare PPO | Admitting: Podiatry

## 2023-11-01 DIAGNOSIS — M79675 Pain in left toe(s): Secondary | ICD-10-CM | POA: Diagnosis not present

## 2023-11-01 DIAGNOSIS — E119 Type 2 diabetes mellitus without complications: Secondary | ICD-10-CM | POA: Diagnosis not present

## 2023-11-01 DIAGNOSIS — B351 Tinea unguium: Secondary | ICD-10-CM | POA: Diagnosis not present

## 2023-11-01 DIAGNOSIS — M79674 Pain in right toe(s): Secondary | ICD-10-CM | POA: Diagnosis not present

## 2023-11-01 NOTE — Progress Notes (Signed)
 This patient returns to my office for at risk foot care.  This patient requires this care by a professional since this patient will be at risk due to having diabetes and renal insufficiency and thrombocytopenia..  Patient is taking eliquis.  This patient is unable to cut nails himself since the patient cannot reach his nails.These nails are painful walking and wearing shoes.  This patient presents for at risk foot care today.  General Appearance  Alert, conversant and in no acute stress.  Vascular  Dorsalis pedis and posterior tibial  pulses are palpable  bilaterally.  Capillary return is within normal limits  bilaterally. Temperature is within normal limits  bilaterally.  Neurologic  Senn-Weinstein monofilament wire test within normal limits  bilaterally. Muscle power within normal limits bilaterally.  Nails Thick disfigured discolored nails with subungual debris  from hallux to fifth toes bilaterally. No evidence of bacterial infection or drainage bilaterally.  Orthopedic  No limitations of motion  feet .  No crepitus or effusions noted.  No bony pathology or digital deformities noted.  HAV  B/L.  Skin  normotropic skin with no porokeratosis noted bilaterally.  No signs of infections or ulcers noted.     Onychomycosis  Pain in right toes  Pain in left toes  Consent was obtained for treatment procedures.   Mechanical debridement of nails 1-5  bilaterally performed with a nail nipper.  Filed with dremel without incident.    Return office visit   3 months                   Told patient to return for periodic foot care and evaluation due to potential at risk complications.   Helane Gunther DPM

## 2023-11-07 DIAGNOSIS — H40022 Open angle with borderline findings, high risk, left eye: Secondary | ICD-10-CM | POA: Diagnosis not present

## 2023-11-07 DIAGNOSIS — Z961 Presence of intraocular lens: Secondary | ICD-10-CM | POA: Diagnosis not present

## 2023-11-07 DIAGNOSIS — H52203 Unspecified astigmatism, bilateral: Secondary | ICD-10-CM | POA: Diagnosis not present

## 2023-11-17 ENCOUNTER — Other Ambulatory Visit: Payer: Medicare PPO

## 2023-11-20 DIAGNOSIS — G4733 Obstructive sleep apnea (adult) (pediatric): Secondary | ICD-10-CM | POA: Diagnosis not present

## 2023-11-23 ENCOUNTER — Other Ambulatory Visit

## 2023-11-23 DIAGNOSIS — E1122 Type 2 diabetes mellitus with diabetic chronic kidney disease: Secondary | ICD-10-CM

## 2023-11-23 DIAGNOSIS — I10 Essential (primary) hypertension: Secondary | ICD-10-CM

## 2023-11-23 DIAGNOSIS — E210 Primary hyperparathyroidism: Secondary | ICD-10-CM

## 2023-11-23 DIAGNOSIS — E785 Hyperlipidemia, unspecified: Secondary | ICD-10-CM

## 2023-11-23 DIAGNOSIS — E119 Type 2 diabetes mellitus without complications: Secondary | ICD-10-CM | POA: Diagnosis not present

## 2023-11-24 ENCOUNTER — Ambulatory Visit: Payer: Self-pay | Admitting: Family Medicine

## 2023-11-24 LAB — CBC WITH DIFFERENTIAL/PLATELET
Absolute Lymphocytes: 713 {cells}/uL — ABNORMAL LOW (ref 850–3900)
Absolute Monocytes: 486 {cells}/uL (ref 200–950)
Basophils Absolute: 22 {cells}/uL (ref 0–200)
Basophils Relative: 0.4 %
Eosinophils Absolute: 184 {cells}/uL (ref 15–500)
Eosinophils Relative: 3.4 %
HCT: 44.2 % (ref 38.5–50.0)
Hemoglobin: 13.8 g/dL (ref 13.2–17.1)
MCH: 31.2 pg (ref 27.0–33.0)
MCHC: 31.2 g/dL — ABNORMAL LOW (ref 32.0–36.0)
MCV: 100 fL (ref 80.0–100.0)
MPV: 11.6 fL (ref 7.5–12.5)
Monocytes Relative: 9 %
Neutro Abs: 3996 {cells}/uL (ref 1500–7800)
Neutrophils Relative %: 74 %
Platelets: 116 10*3/uL — ABNORMAL LOW (ref 140–400)
RBC: 4.42 10*6/uL (ref 4.20–5.80)
RDW: 13.5 % (ref 11.0–15.0)
Total Lymphocyte: 13.2 %
WBC: 5.4 10*3/uL (ref 3.8–10.8)

## 2023-11-24 LAB — LIPID PANEL
Cholesterol: 119 mg/dL (ref <200)
HDL: 42 mg/dL (ref >=40)
LDL Cholesterol (Calc): 59 mg/dL
Non-HDL Cholesterol (Calc): 77 mg/dL (ref <130)
Total CHOL/HDL Ratio: 2.8 (calc) (ref <5.0)
Triglycerides: 101 mg/dL (ref <150)

## 2023-11-24 LAB — COMPREHENSIVE METABOLIC PANEL WITH GFR
AG Ratio: 1.8 (calc) (ref 1.0–2.5)
ALT: 86 U/L — ABNORMAL HIGH (ref 9–46)
AST: 65 U/L — ABNORMAL HIGH (ref 10–35)
Albumin: 4 g/dL (ref 3.6–5.1)
Alkaline phosphatase (APISO): 56 U/L (ref 35–144)
BUN/Creatinine Ratio: 13 (calc) (ref 6–22)
BUN: 35 mg/dL — ABNORMAL HIGH (ref 7–25)
CO2: 25 mmol/L (ref 20–32)
Calcium: 8.9 mg/dL (ref 8.6–10.3)
Chloride: 109 mmol/L (ref 98–110)
Creat: 2.73 mg/dL — ABNORMAL HIGH (ref 0.70–1.22)
Globulin: 2.2 g/dL (ref 1.9–3.7)
Glucose, Bld: 99 mg/dL (ref 65–99)
Potassium: 4.6 mmol/L (ref 3.5–5.3)
Sodium: 143 mmol/L (ref 135–146)
Total Bilirubin: 0.5 mg/dL (ref 0.2–1.2)
Total Protein: 6.2 g/dL (ref 6.1–8.1)
eGFR: 23 mL/min/{1.73_m2} — ABNORMAL LOW (ref >=60)

## 2023-11-24 LAB — HEMOGLOBIN A1C
Hgb A1c MFr Bld: 5.8 % — ABNORMAL HIGH (ref <5.7)
Mean Plasma Glucose: 120 mg/dL
eAG (mmol/L): 6.6 mmol/L

## 2023-11-24 LAB — VITAMIN D 25 HYDROXY (VIT D DEFICIENCY, FRACTURES): Vit D, 25-Hydroxy: 29 ng/mL — ABNORMAL LOW (ref 30–100)

## 2023-11-24 LAB — VITAMIN B12: Vitamin B-12: 559 pg/mL (ref 200–1100)

## 2023-11-25 DIAGNOSIS — G4733 Obstructive sleep apnea (adult) (pediatric): Secondary | ICD-10-CM | POA: Diagnosis not present

## 2023-11-25 DIAGNOSIS — G4731 Primary central sleep apnea: Secondary | ICD-10-CM | POA: Diagnosis not present

## 2023-11-28 ENCOUNTER — Telehealth: Payer: Self-pay | Admitting: Cardiovascular Disease

## 2023-11-28 DIAGNOSIS — I480 Paroxysmal atrial fibrillation: Secondary | ICD-10-CM

## 2023-11-28 MED ORDER — APIXABAN 2.5 MG PO TABS: 2.5000 mg | ORAL_TABLET | Freq: Two times a day (BID) | ORAL | 1 refills | Status: DC

## 2023-11-28 NOTE — Telephone Encounter (Signed)
*  STAT* If patient is at the pharmacy, call can be transferred to refill team.   1. Which medications need to be refilled? (please list name of each medication and dose if known) ELIQUIS  2.5 MG TABS tablet    4. Which pharmacy/location (including street and city if local pharmacy) is medication to be sent to?  [facility] - [place], [place] - [address]     5. Do they need a 30 day or 90 day supply? 90

## 2023-11-28 NOTE — Telephone Encounter (Signed)
 Eliquis  2.5mg  refill request received. Patient is 83 years old, weight-98.1kg, Crea-2.73 on 11/23/23, Diagnosis-Afib, and last seen by Quita Kicks on 09/26/23. Dose is appropriate based on dosing criteria. Will send in refill to requested pharmacy.

## 2023-11-29 NOTE — Progress Notes (Unsigned)
  Electrophysiology Office Note:   Date:  11/30/2023  ID:  Martin Cordova, DOB [birth year], MRN [id number]  Primary Cardiologist: Debby Sor, MD Electrophysiologist: Will Gladis Norton, MD      History of Present Illness:   Martin Cordova is a 83 y.o. male with h/o OSA, HLD, HTN, atrial flutter, and atrial fibrillation seen today for routine electrophysiology followup.   Since last being seen in our clinic the patient reports doing well overall. No breakthrough AF of which he is aware. Otherwise, he denies chest pain, palpitations, dyspnea, PND, orthopnea, nausea, vomiting, dizziness, syncope, edema, weight gain, or early satiety.   Review of systems complete and found to be negative unless listed in HPI.   EP Information / Studies Reviewed:    EKG is ordered today. Personal review as below.  EKG Interpretation Date/Time:  Thursday November 30 2023 11:50:14 EDT Ventricular Rate:  54 PR Interval:  238 QRS Duration:  174 QT Interval:  506 QTC Calculation: 479 R Axis:   -50  Text Interpretation: Sinus bradycardia with 1st degree A-V block Right bundle branch block Left anterior fascicular block Bifascicular block When compared with ECG of 26-Sep-2023 10:49, PR interval has increased Confirmed by Lesia Sharper [phone]) on 11/30/2023 11:59:34 AM    Arrhythmia/Device History S/p AFL ablation 04/2017 S/p PVI 08/29/2023   Physical Exam:   VS:  BP 128/70   Pulse (!) 59   Ht 5' 11 (1.803 m)   Wt 215 lb 3.2 oz (97.6 kg)   SpO2 96%   BMI 30.01 kg/m    Wt Readings from Last 3 Encounters:  11/30/23 215 lb 3.2 oz (97.6 kg)  10/13/23 216 lb 4 oz (98.1 kg)  09/26/23 212 lb 3.2 oz (96.3 kg)     GEN: No acute distress NECK: No JVD; No carotid bruits CARDIAC: Regular rate and rhythm, no murmurs, rubs, gallops RESPIRATORY:  Clear to auscultation without rales, wheezing or rhonchi  ABDOMEN: Soft, non-tender, non-distended EXTREMITIES:  No edema; No deformity   ASSESSMENT AND PLAN:     Persistent AF AFL Stop amiodarone  now s/p ablation.  Continue coreg  12.5 mg q am and 6.25 mg q pm Continue Eliquis  2.5 mg BID (age, Cr) for CHA2DS2/VASc of at least 4.   Secondary hypercoagulable state Pt on Eliquis  as above   HTN Stable on current regimen   OSA  Encouraged nightly CPAP   Follow up with EP Team in 6 months, as needed after that if no further AF.   Signed, Sharper Prentice Lesia, PA-C

## 2023-11-30 ENCOUNTER — Encounter: Payer: Self-pay | Admitting: Student

## 2023-11-30 ENCOUNTER — Ambulatory Visit: Attending: Student | Admitting: Student

## 2023-11-30 VITALS — BP 128/70 | HR 59 | Ht 71.0 in | Wt 215.2 lb

## 2023-11-30 DIAGNOSIS — I4819 Other persistent atrial fibrillation: Secondary | ICD-10-CM

## 2023-11-30 DIAGNOSIS — G4733 Obstructive sleep apnea (adult) (pediatric): Secondary | ICD-10-CM | POA: Diagnosis not present

## 2023-11-30 DIAGNOSIS — I480 Paroxysmal atrial fibrillation: Secondary | ICD-10-CM

## 2023-11-30 DIAGNOSIS — D6869 Other thrombophilia: Secondary | ICD-10-CM | POA: Diagnosis not present

## 2023-11-30 DIAGNOSIS — I10 Essential (primary) hypertension: Secondary | ICD-10-CM | POA: Diagnosis not present

## 2023-11-30 NOTE — Patient Instructions (Signed)
 Medication Instructions:  Stop amiodarone  *If you need a refill on your cardiac medications before your next appointment, please call your pharmacy*  Lab Work: None ordered If you have labs (blood work) drawn today and your tests are completely normal, you will receive your results only by: MyChart Message (if you have MyChart) OR A paper copy in the mail If you have any lab test that is abnormal or we need to change your treatment, we will call you to review the results.  Follow-Up: At [facility], you and your health needs are our priority.  As part of our continuing mission to provide you with exceptional heart care, our providers are all part of one team.  This team includes your primary Cardiologist (physician) and Advanced Practice Providers or APPs (Physician Assistants and Nurse Practitioners) who all work together to provide you with the care you need, when you need it.  Your next appointment:   6 month(s)  Provider:   You may see Will Gladis Norton, MD or one of the following Advanced Practice Providers on your designated Care Team:   Charlies Arthur, PA-C Michael Andy Tillery, PA-C Suzann Riddle, NP Daphne Barrack, NP

## 2023-12-07 ENCOUNTER — Ambulatory Visit: Payer: Medicare PPO

## 2023-12-12 ENCOUNTER — Other Ambulatory Visit

## 2023-12-12 DIAGNOSIS — R7989 Other specified abnormal findings of blood chemistry: Secondary | ICD-10-CM

## 2023-12-12 LAB — HEPATIC FUNCTION PANEL
AG Ratio: 1.8 (calc) (ref 1.0–2.5)
ALT: 102 U/L — ABNORMAL HIGH (ref 9–46)
AST: 60 U/L — ABNORMAL HIGH (ref 10–35)
Albumin: 3.9 g/dL (ref 3.6–5.1)
Alkaline phosphatase (APISO): 58 U/L (ref 35–144)
Bilirubin, Direct: 0.2 mg/dL (ref 0.0–0.2)
Globulin: 2.2 g/dL (ref 1.9–3.7)
Indirect Bilirubin: 0.3 mg/dL (ref 0.2–1.2)
Total Bilirubin: 0.5 mg/dL (ref 0.2–1.2)
Total Protein: 6.1 g/dL (ref 6.1–8.1)

## 2023-12-14 ENCOUNTER — Other Ambulatory Visit: Payer: Self-pay | Admitting: Family Medicine

## 2023-12-14 ENCOUNTER — Ambulatory Visit: Payer: Self-pay | Admitting: Family Medicine

## 2023-12-14 DIAGNOSIS — R7989 Other specified abnormal findings of blood chemistry: Secondary | ICD-10-CM

## 2023-12-15 ENCOUNTER — Ambulatory Visit
Admission: RE | Admit: 2023-12-15 | Discharge: 2023-12-15 | Disposition: A | Discharge status: Home / Self Care | Source: Ambulatory Visit | Attending: Family Medicine | Admitting: Family Medicine

## 2023-12-15 DIAGNOSIS — R7989 Other specified abnormal findings of blood chemistry: Secondary | ICD-10-CM

## 2023-12-15 DIAGNOSIS — R945 Abnormal results of liver function studies: Secondary | ICD-10-CM | POA: Diagnosis not present

## 2023-12-17 ENCOUNTER — Other Ambulatory Visit: Payer: Self-pay | Admitting: Cardiovascular Disease

## 2023-12-18 ENCOUNTER — Ambulatory Visit: Payer: Self-pay | Admitting: Family Medicine

## 2023-12-20 DIAGNOSIS — G4733 Obstructive sleep apnea (adult) (pediatric): Secondary | ICD-10-CM | POA: Diagnosis not present

## 2023-12-26 ENCOUNTER — Ambulatory Visit: Payer: Medicare PPO | Admitting: Family Medicine

## 2023-12-26 ENCOUNTER — Encounter: Payer: Self-pay | Admitting: Family Medicine

## 2023-12-26 VITALS — BP 160/80 | HR 60 | Temp 98.7°F | Ht 71.0 in | Wt 216.1 lb

## 2023-12-26 DIAGNOSIS — E119 Type 2 diabetes mellitus without complications: Secondary | ICD-10-CM

## 2023-12-26 DIAGNOSIS — N184 Chronic kidney disease, stage 4 (severe): Secondary | ICD-10-CM | POA: Diagnosis not present

## 2023-12-26 DIAGNOSIS — Z0001 Encounter for general adult medical examination with abnormal findings: Secondary | ICD-10-CM | POA: Diagnosis not present

## 2023-12-26 DIAGNOSIS — I480 Paroxysmal atrial fibrillation: Secondary | ICD-10-CM | POA: Diagnosis not present

## 2023-12-26 DIAGNOSIS — R7989 Other specified abnormal findings of blood chemistry: Secondary | ICD-10-CM | POA: Diagnosis not present

## 2023-12-26 DIAGNOSIS — E785 Hyperlipidemia, unspecified: Secondary | ICD-10-CM

## 2023-12-26 LAB — COMPREHENSIVE METABOLIC PANEL WITH GFR
AG Ratio: 1.7 (calc) (ref 1.0–2.5)
ALT: 101 U/L — ABNORMAL HIGH (ref 9–46)
AST: 72 U/L — ABNORMAL HIGH (ref 10–35)
Albumin: 4 g/dL (ref 3.6–5.1)
Alkaline phosphatase (APISO): 62 U/L (ref 35–144)
BUN/Creatinine Ratio: 11 (calc) (ref 6–22)
BUN: 31 mg/dL — ABNORMAL HIGH (ref 7–25)
CO2: 25 mmol/L (ref 20–32)
Calcium: 9.2 mg/dL (ref 8.6–10.3)
Chloride: 106 mmol/L (ref 98–110)
Creat: 2.7 mg/dL — ABNORMAL HIGH (ref 0.70–1.22)
Globulin: 2.4 g/dL (ref 1.9–3.7)
Glucose, Bld: 125 mg/dL — ABNORMAL HIGH (ref 65–99)
Potassium: 4.3 mmol/L (ref 3.5–5.3)
Sodium: 139 mmol/L (ref 135–146)
Total Bilirubin: 0.7 mg/dL (ref 0.2–1.2)
Total Protein: 6.4 g/dL (ref 6.1–8.1)
eGFR: 23 mL/min/1.73m2 — ABNORMAL LOW (ref >=60)

## 2023-12-26 NOTE — Progress Notes (Signed)
 Subjective:    Patient ID: Martin Cordova, male    DOB: [birth year], 83 y.o.   MRN: [id number]  HPI  Patient is a very pleasant 83 year old Caucasian male who is here today for complete physical exam.  Past medical history is significant for atrial fibrillation for which he sees cardiology.  They recently discontinued his amiodarone .  He also has type 2 diabetes mellitus, hld, CKD stage 4. He is currently on jardiance and ozempic for diabetes.  He sees endocrinology and nephrology.  Last colonoscopy was 2022 and is up to date.  Recently his liver function test have been elevated.  I temporarily stop the patient's atorvastatin  and repeating his liver function test 4 weeks later.  They were unchanged.  The patient is still off atorvastatin .  A right upper quadrant ultrasound showed diffuse echogenicity but no discrete masses or malignancies Immunization History  Administered Date(s) Administered   Fluad Quad(high Dose 65+) 03/03/2022   Fluzone Influenza virus vaccine,trivalent (IIV3), split virus 04/10/2017, 02/21/2019, 04/06/2021   Influenza Split 03/06/2012   Influenza Whole 03/19/2009, 03/06/2010   Influenza, High Dose Seasonal PF 02/08/2016, 04/10/2017, 02/15/2018, 02/21/2019, 02/20/2020   Influenza,inj,Quad PF,6+ Mos 02/23/2013   Influenza-Unspecified 04/06/2014, 04/10/2017, 03/12/2021, 03/13/2023   Moderna Covid-19 Seasonal Vaccine 6 months thru 81years of age 49/21/2024   PFIZER Comirnaty(Gray Top)Covid-19 Tri-Sucrose Vaccine 09/29/2020   PFIZER(Purple Top)SARS-COV-2 Vaccination 06/26/2019, 07/17/2019, 03/11/2020, 04/06/2020   Pfizer Covid-19 Vaccine Bivalent Booster 4yrs & up 04/12/2021, 05/06/2021   Pneumococcal Conjugate-13 06/20/2014   Pneumococcal Polysaccharide-23 06/16/2008   Respiratory Syncytial Virus Vaccine,Recomb Aduvanted(Arexvy) 02/16/2022   Td 01/25/1999, 07/06/2009   Tdap 06/20/2014   Zoster Recombinant(Shingrix) 12/09/2017, 02/15/2018   Zoster, Live 02/09/2013    Lab on 12/12/2023  Component Date Value Ref Range Status   Total Protein 12/12/2023 6.1  6.1 - 8.1 g/dL Final   Albumin 92/91/7974 3.9  3.6 - 5.1 g/dL Final   Globulin 92/91/7974 2.2  1.9 - 3.7 g/dL (calc) Final   AG Ratio 12/12/2023 1.8  1.0 - 2.5 (calc) Final   Total Bilirubin 12/12/2023 0.5  0.2 - 1.2 mg/dL Final   Bilirubin, Direct 12/12/2023 0.2  0.0 - 0.2 mg/dL Final   Indirect Bilirubin 12/12/2023 0.3  0.2 - 1.2 mg/dL (calc) Final   Alkaline phosphatase (APISO) 12/12/2023 58  35 - 144 U/L Final   AST 12/12/2023 60 (H)  10 - 35 U/L Final   ALT 12/12/2023 102 (H)  9 - 46 U/L Final    Past Medical History:  Diagnosis Date   Allergy    Arrhythmia    Arthritis    Cataract    Clotting disorder (HCC)    Colon polyps    Diabetes mellitus without complication (HCC)    Diverticulitis    Diverticulosis    GERD 12/07/2006   History of kidney stones    HYPERGLYCEMIA 12/07/2006   HYPERLIPIDEMIA 12/07/2006   mixed wth an atherogenic dyslipidemic pattern   HYPERTENSION 12/07/2006   Nephrolithiasis    Nonalcoholic fatty liver disease    Normal coronary arteries 04/06/2007   by cath EF 55%., last ECHO 10/13/10 EF>55% mild MR, last nuc 06/16/10 EF 57% low risk scan   OBESITY 11/10/2009   OSA treated with BiPAP 10/22/2007   PAF (paroxysmal atrial fibrillation) (HCC) 04/09/2013   Palpitations    tachy   Primary hyperparathyroidism (HCC)    Pulmonary HTN (HCC)    RV syst.  40-29mmHg- moderate by echo   Renal insufficiency    Cr to  3 on ACE   Screening for prostate cancer 01/11/2023   Skin cancer    Sleep apnea    SOB (shortness of breath)    with exertion   THROMBOCYTOPENIA 12/07/2006   TRANSAMINASES, SERUM, ELEVATED 12/07/2006   VENTRICULAR TACHYCARDIA 03/27/2007   monitored by dr. burnard   Past Surgical History:  Procedure Laterality Date   A-FLUTTER ABLATION N/A 04/19/2017   Procedure: A-FLUTTER ABLATION;  Surgeon: Fernande Elspeth BROCKS, MD;  Location: [facility] INVASIVE CV LAB;   Service: Cardiovascular;  Laterality: N/A;   ATRIAL FIBRILLATION ABLATION N/A 08/29/2023   Procedure: ATRIAL FIBRILLATION ABLATION;  Surgeon: Inocencio Soyla Lunger, MD;  Location: [facility] INVASIVE CV LAB;  Service: Cardiovascular;  Laterality: N/A;   CARDIAC CATHETERIZATION  04/06/2007   normal cors   CARDIOVERSION N/A 08/05/2013   Procedure: CARDIOVERSION;  Surgeon: Debby DELENA burnard, MD;  Location: [facility] ENDOSCOPY;  Service: Cardiovascular;  Laterality: N/A;   CARDIOVERSION N/A 12/26/2016   Procedure: CARDIOVERSION;  Surgeon: burnard Debby DELENA, MD;  Location: [facility] ENDOSCOPY;  Service: Cardiovascular;  Laterality: N/A;   CHOLECYSTECTOMY     CYSTOSCOPY/RETROGRADE/URETEROSCOPY  08/19/2011   Procedure: CYSTOSCOPY/RETROGRADE/URETEROSCOPY;  Surgeon: Thomasine Oiler, MD;  Location: [facility];  Service: Urology;  Laterality: Right;  JJ STENT PLACEMENT   EYE SURGERY     PARATHYROIDECTOMY Left 05/19/2023   Procedure: LEFT INFERIOR PARATHYROIDECTOMY;  Surgeon: Eletha Boas, MD;  Location: [facility] ORS;  Service: General;  Laterality: Left;   ROTATOR CUFF REPAIR Right    Current Outpatient Medications on File Prior to Visit  Medication Sig Dispense Refill   apixaban  (ELIQUIS ) 2.5 MG TABS tablet Take 1 tablet (2.5 mg total) by mouth 2 (two) times daily. 180 tablet 1   empagliflozin (JARDIANCE) 10 MG TABS tablet Take 10 mg by mouth daily.     fluticasone  (FLONASE ) 50 MCG/ACT nasal spray Place 2 sprays into both nostrils daily. 16 g 6   furosemide  (LASIX ) 40 MG tablet Alternate between taking 40mg  (1 tablet) a day and 20mg  (1/2 tablet) a day. 90 tablet 3   hydrALAZINE  (APRESOLINE ) 25 MG tablet TAKE ONE & ONE-HALF TABLET BY MOUTH EVERY MORNING AND AT BEDTIME 270 tablet 0   omega-3 acid ethyl esters (LOVAZA ) 1 g capsule TAKE 2 CAPSULES BY MOUTH ONCE DAILY. 180 capsule 1   OZEMPIC, 0.25 OR 0.5 MG/DOSE, 2 MG/3ML SOPN Inject 0.25 mg into the skin once a week.     tadalafil (CIALIS) 5 MG tablet Take 5 mg by mouth  every evening.     atorvastatin  (LIPITOR) 10 MG tablet TAKE ONE TABLET BY MOUTH ONCE DAILY AT BEDTIME. 90 tablet 3   carvedilol  (COREG ) 12.5 MG tablet Take 1 tablet ( 12.5 mg ) in morning and 1/2 tablet ( 6.25 mg ) in pm (Patient not taking: Reported on 12/26/2023) 135 tablet 3   No current facility-administered medications on file prior to visit.   Allergies  Allergen Reactions   Lisinopril  Swelling and Other (See Comments)    Renal insufficiency also   Trulicity [Dulaglutide] Other (See Comments)    Bloating    Ibuprofen Other (See Comments)    Per the patient, he has kidney and liver issues and is NOT suppose to take this   Social History   Socioeconomic History   Marital status: Married    Spouse name: Not on file   Number of children: 4   Years of education: Not on file   Highest education level: Master's degree (e.g., MA, MS, MEng, MEd,  MSW, MBA)  Occupational History   Occupation: retired Runner, broadcasting/film/video  Tobacco Use   Smoking status: Former    Current packs/day: 0.00    Average packs/day: 0.3 packs/day for 15.0 years (5.0 ttl pk-yrs)    Types: Cigarettes    Start date: 08/05/1970    Quit date: 08/04/1980    Years since quitting: 43.4    Passive exposure: Never   Smokeless tobacco: Former    Types: Chew    Quit date: 08/08/2004   Tobacco comments:    Former smoker 09/26/23  Vaping Use   Vaping status: Never Used  Substance and Sexual Activity   Alcohol use: Not Currently   Drug use: No   Sexual activity: Yes  Other Topics Concern   Not on file  Social History Narrative   Not on file   Social Drivers of Health   Financial Resource Strain: Low Risk  (07/28/2023)   Overall Financial Resource Strain (CARDIA)    Difficulty of Paying Living Expenses: Not very hard  Food Insecurity: No Food Insecurity (07/28/2023)   Hunger Vital Sign    Worried About Running Out of Food in the Last Year: Never true    Ran Out of Food in the Last Year: Never true  Transportation Needs: No  Transportation Needs (07/28/2023)   PRAPARE - Administrator, Civil Service (Medical): No    Lack of Transportation (Non-Medical): No  Physical Activity: Inactive (07/28/2023)   Exercise Vital Sign    Days of Exercise per Week: 0 days    Minutes of Exercise per Session: 30 min  Stress: No Stress Concern Present (07/28/2023)   [employer] of Occupational Health - Occupational Stress Questionnaire    Feeling of Stress : Only a little  Social Connections: Socially Integrated (07/28/2023)   Social Connection and Isolation Panel    Frequency of Communication with Friends and Family: More than three times a week    Frequency of Social Gatherings with Friends and Family: More than three times a week    Attends Religious Services: More than 4 times per year    Active Member of [employer] or Organizations: Yes    Attends Engineer, structural: More than 4 times per year    Marital Status: Married  Catering manager Violence: Not At Risk (12/01/2022)   Humiliation, Afraid, Rape, and Kick questionnaire    Fear of Current or Ex-Partner: No    Emotionally Abused: No    Physically Abused: No    Sexually Abused: No   Family History  Problem Relation Age of Onset   Diabetes Mother    Heart failure Mother    Stroke Mother    Kidney failure Father    Diabetes Father    Kidney disease Father    Cancer Paternal Uncle    Colon cancer Neg Hx    Esophageal cancer Neg Hx    Stomach cancer Neg Hx    Rectal cancer Neg Hx      Review of Systems negative    Objective:   Physical Exam Vitals reviewed.  Constitutional:      General: He is not in acute distress.    Appearance: He is not diaphoretic.  HENT:     Head: Normocephalic and atraumatic.     Right Ear: External ear normal.     Left Ear: External ear normal.     Nose: Nose normal.     Mouth/Throat:     Pharynx: No oropharyngeal exudate.  Eyes:  General: No scleral icterus.       Right eye: No discharge.         Left eye: No discharge.     Conjunctiva/sclera: Conjunctivae normal.     Pupils: Pupils are equal, round, and reactive to light.  Cardiovascular:     Rate and Rhythm: Normal rate and regular rhythm.     Heart sounds: Normal heart sounds. No murmur heard.    No friction rub. No gallop.  Pulmonary:     Effort: Pulmonary effort is normal. No respiratory distress.     Breath sounds: Normal breath sounds. No stridor. No wheezing or rales.  Chest:     Chest wall: No tenderness.  Abdominal:     General: Bowel sounds are normal. There is no distension.     Palpations: Abdomen is soft. There is no mass.     Tenderness: There is no abdominal tenderness. There is no guarding or rebound.  Musculoskeletal:        General: No tenderness. Normal range of motion.     Cervical back: Normal range of motion and neck supple.  Lymphadenopathy:     Cervical: No cervical adenopathy.  Skin:    General: Skin is warm.     Coloration: Skin is not pale.     Findings: No erythema or rash.  Neurological:     Mental Status: He is alert and oriented to person, place, and time.     Cranial Nerves: No cranial nerve deficit.     Coordination: Coordination normal.  Psychiatric:        Behavior: Behavior normal.        Thought Content: Thought content normal.        Judgment: Judgment normal.           Assessment & Plan:  Elevated LFTs - Plan: Comprehensive metabolic panel with GFR  Stage 4 chronic kidney disease (HCC)  Controlled type 2 diabetes mellitus without complication, without long-term current use of insulin  (HCC)  PAF (paroxysmal atrial fibrillation) (HCC)  Hyperlipidemia LDL goal <70 Hemoglobin A1c is outstanding.  LDL cholesterol when he was taking the atorvastatin  was well below his goal of 70.  CBC was normal except for mild chronic thrombocytopenia at 116.  My only concern is his persistently elevated liver function test which I presume is due to fatty liver disease.  Repeat a CMP today.   If liver tests are still elevated, I would have the patient resume atorvastatin  and focus on diet exercise weight loss and increase the Ozempic to 0.5 mg subcu weekly as a treatment for fatty liver disease.  He does not have physical stigmata or risk factors for viral hepatitis, hemochromatosis, Wilson's disease, alpha-1 antitrypsin deficiency, or autoimmune hepatitis

## 2023-12-27 ENCOUNTER — Ambulatory Visit: Payer: Self-pay | Admitting: Family Medicine

## 2024-02-02 ENCOUNTER — Ambulatory Visit: Admitting: Podiatry

## 2024-02-02 ENCOUNTER — Encounter: Payer: Self-pay | Admitting: Podiatry

## 2024-02-02 DIAGNOSIS — M79674 Pain in right toe(s): Secondary | ICD-10-CM

## 2024-02-02 DIAGNOSIS — Z7901 Long term (current) use of anticoagulants: Secondary | ICD-10-CM

## 2024-02-02 DIAGNOSIS — B351 Tinea unguium: Secondary | ICD-10-CM | POA: Diagnosis not present

## 2024-02-02 DIAGNOSIS — M79675 Pain in left toe(s): Secondary | ICD-10-CM

## 2024-02-02 DIAGNOSIS — E119 Type 2 diabetes mellitus without complications: Secondary | ICD-10-CM | POA: Diagnosis not present

## 2024-02-02 NOTE — Progress Notes (Signed)
 This patient returns to my office for at risk foot care.  This patient requires this care by a professional since this patient will be at risk due to having diabetes and renal insufficiency and thrombocytopenia..  Patient is taking eliquis.  This patient is unable to cut nails himself since the patient cannot reach his nails.These nails are painful walking and wearing shoes.  This patient presents for at risk foot care today.  General Appearance  Alert, conversant and in no acute stress.  Vascular  Dorsalis pedis and posterior tibial  pulses are palpable  bilaterally.  Capillary return is within normal limits  bilaterally. Temperature is within normal limits  bilaterally.  Neurologic  Senn-Weinstein monofilament wire test within normal limits  bilaterally. Muscle power within normal limits bilaterally.  Nails Thick disfigured discolored nails with subungual debris  from hallux to fifth toes bilaterally. No evidence of bacterial infection or drainage bilaterally.  Orthopedic  No limitations of motion  feet .  No crepitus or effusions noted.  No bony pathology or digital deformities noted.  HAV  B/L.  Skin  normotropic skin with no porokeratosis noted bilaterally.  No signs of infections or ulcers noted.     Onychomycosis  Pain in right toes  Pain in left toes  Consent was obtained for treatment procedures.   Mechanical debridement of nails 1-5  bilaterally performed with a nail nipper.  Filed with dremel without incident.    Return office visit   3 months                   Told patient to return for periodic foot care and evaluation due to potential at risk complications.   Helane Gunther DPM

## 2024-02-06 DIAGNOSIS — N184 Chronic kidney disease, stage 4 (severe): Secondary | ICD-10-CM | POA: Diagnosis not present

## 2024-02-13 ENCOUNTER — Other Ambulatory Visit: Payer: Self-pay

## 2024-02-14 DIAGNOSIS — I4891 Unspecified atrial fibrillation: Secondary | ICD-10-CM | POA: Diagnosis not present

## 2024-02-14 DIAGNOSIS — I129 Hypertensive chronic kidney disease with stage 1 through stage 4 chronic kidney disease, or unspecified chronic kidney disease: Secondary | ICD-10-CM | POA: Diagnosis not present

## 2024-02-14 DIAGNOSIS — E1129 Type 2 diabetes mellitus with other diabetic kidney complication: Secondary | ICD-10-CM | POA: Diagnosis not present

## 2024-02-14 DIAGNOSIS — E1122 Type 2 diabetes mellitus with diabetic chronic kidney disease: Secondary | ICD-10-CM | POA: Diagnosis not present

## 2024-02-14 DIAGNOSIS — N184 Chronic kidney disease, stage 4 (severe): Secondary | ICD-10-CM | POA: Diagnosis not present

## 2024-02-14 DIAGNOSIS — N2581 Secondary hyperparathyroidism of renal origin: Secondary | ICD-10-CM | POA: Diagnosis not present

## 2024-02-15 ENCOUNTER — Ambulatory Visit (INDEPENDENT_AMBULATORY_CARE_PROVIDER_SITE_OTHER): Admitting: *Deleted

## 2024-02-15 VITALS — Ht 71.0 in | Wt 216.0 lb

## 2024-02-15 DIAGNOSIS — Z1211 Encounter for screening for malignant neoplasm of colon: Secondary | ICD-10-CM

## 2024-02-15 DIAGNOSIS — Z0000 Encounter for general adult medical examination without abnormal findings: Secondary | ICD-10-CM

## 2024-02-15 MED ORDER — OMEGA-3-ACID ETHYL ESTERS 1 G PO CAPS: 2.0000 | ORAL_CAPSULE | Freq: Every day | ORAL | 1 refills | Status: AC

## 2024-02-15 NOTE — Progress Notes (Signed)
 Subjective:   Martin Cordova is a 83 y.o. male who presents for Medicare Annual/Subsequent preventive examination.  Visit Complete: Virtual I connected with  Martin Cordova on 02/15/24 by a audio enabled telemedicine application and verified that I am speaking with the correct person using two identifiers.  Patient Location: Home  Provider Location: Home Office  I discussed the limitations of evaluation and management by telemedicine. The patient expressed understanding and agreed to proceed.  Vital Signs: Because this visit was a virtual/telehealth visit, some criteria may be missing or patient reported. Any vitals not documented were not able to be obtained and vitals that have been documented are patient reported.  .  Cardiac Risk Factors include: advanced age (>24men, >36 women);hypertension;male gender;obesity (BMI >30kg/m2)     Objective:    Today's Vitals   02/15/24 0834  Weight: 216 lb (98 kg)  Height: 5' 11 (1.803 m)   Body mass index is 30.13 kg/m.     02/15/2024    8:26 AM 08/29/2023   10:43 AM 05/19/2023   11:23 AM 05/16/2023    8:52 AM 12/01/2022   12:17 PM 09/27/2022   10:49 PM 02/09/2021    8:16 AM  Advanced Directives  Does Patient Have a Medical Advance Directive? Yes Yes Yes Yes No Yes Yes  Type of Diplomatic Services operational officer Living will Healthcare Power of [place];Living will Healthcare Power of [place];Living will  Healthcare Power of [place];Living will Healthcare Power of [place];Living will  Does patient want to make changes to medical advance directive?  No - Patient declined No - Patient declined   No - Patient declined No - Patient declined  Copy of Healthcare Power of Attorney in Chart? Yes - validated most recent copy scanned in chart (See row information)  Yes - validated most recent copy scanned in chart (See row information) Yes - validated most recent copy scanned in chart (See row information)  Yes - validated most  recent copy scanned in chart (See row information) No - copy requested  Would patient like information on creating a medical advance directive?     Yes (MAU/Ambulatory/Procedural Areas - Information given)      Current Medications (verified) Outpatient Encounter Medications as of 02/15/2024  Medication Sig   apixaban  (ELIQUIS ) 2.5 MG TABS tablet Take 1 tablet (2.5 mg total) by mouth 2 (two) times daily.   atorvastatin  (LIPITOR) 10 MG tablet TAKE ONE TABLET BY MOUTH ONCE DAILY AT BEDTIME.   carvedilol  (COREG ) 12.5 MG tablet Take 1 tablet ( 12.5 mg ) in morning and 1/2 tablet ( 6.25 mg ) in pm   empagliflozin (JARDIANCE) 10 MG TABS tablet Take 10 mg by mouth daily.   fluticasone  (FLONASE ) 50 MCG/ACT nasal spray Place 2 sprays into both nostrils daily.   furosemide  (LASIX ) 40 MG tablet Alternate between taking 40mg  (1 tablet) a day and 20mg  (1/2 tablet) a day.   hydrALAZINE  (APRESOLINE ) 25 MG tablet TAKE ONE & ONE-HALF TABLET BY MOUTH EVERY MORNING AND AT BEDTIME   omega-3 acid ethyl esters (LOVAZA ) 1 g capsule TAKE 2 CAPSULES BY MOUTH ONCE DAILY.   OZEMPIC, 0.25 OR 0.5 MG/DOSE, 2 MG/3ML SOPN Inject 0.25 mg into the skin once a week.   tadalafil (CIALIS) 5 MG tablet Take 5 mg by mouth every evening.   No facility-administered encounter medications on file as of 02/15/2024.    Allergies (verified) Lisinopril , Trulicity [dulaglutide], and Ibuprofen   History: Past Medical History:  Diagnosis Date  Allergy    Arrhythmia    Arthritis    Cataract    Clotting disorder (HCC)    Colon polyps    Diabetes mellitus without complication (HCC)    Diverticulitis    Diverticulosis    GERD 12/07/2006   History of kidney stones    HYPERGLYCEMIA 12/07/2006   HYPERLIPIDEMIA 12/07/2006   mixed wth an atherogenic dyslipidemic pattern   HYPERTENSION 12/07/2006   Nephrolithiasis    Nonalcoholic fatty liver disease    Normal coronary arteries 04/06/2007   by cath EF 55%., last ECHO 10/13/10 EF>55%  mild MR, last nuc 06/16/10 EF 57% low risk scan   OBESITY 11/10/2009   OSA treated with BiPAP 10/22/2007   PAF (paroxysmal atrial fibrillation) (HCC) 04/09/2013   Palpitations    tachy   Primary hyperparathyroidism (HCC)    Pulmonary HTN (HCC)    RV syst.  40-29mmHg- moderate by echo   Renal insufficiency    Cr to 3 on ACE   Screening for prostate cancer 01/11/2023   Skin cancer    Sleep apnea    SOB (shortness of breath)    with exertion   THROMBOCYTOPENIA 12/07/2006   TRANSAMINASES, SERUM, ELEVATED 12/07/2006   VENTRICULAR TACHYCARDIA 03/27/2007   monitored by dr. burnard   Past Surgical History:  Procedure Laterality Date   A-FLUTTER ABLATION N/A 04/19/2017   Procedure: A-FLUTTER ABLATION;  Surgeon: Fernande Elspeth BROCKS, MD;  Location: [facility] INVASIVE CV LAB;  Service: Cardiovascular;  Laterality: N/A;   ATRIAL FIBRILLATION ABLATION N/A 08/29/2023   Procedure: ATRIAL FIBRILLATION ABLATION;  Surgeon: Inocencio Soyla Lunger, MD;  Location: [facility] INVASIVE CV LAB;  Service: Cardiovascular;  Laterality: N/A;   CARDIAC CATHETERIZATION  04/06/2007   normal cors   CARDIOVERSION N/A 08/05/2013   Procedure: CARDIOVERSION;  Surgeon: Debby DELENA burnard, MD;  Location: [facility] ENDOSCOPY;  Service: Cardiovascular;  Laterality: N/A;   CARDIOVERSION N/A 12/26/2016   Procedure: CARDIOVERSION;  Surgeon: burnard Debby DELENA, MD;  Location: [facility] ENDOSCOPY;  Service: Cardiovascular;  Laterality: N/A;   CHOLECYSTECTOMY     CYSTOSCOPY/RETROGRADE/URETEROSCOPY  08/19/2011   Procedure: CYSTOSCOPY/RETROGRADE/URETEROSCOPY;  Surgeon: Thomasine Oiler, MD;  Location: [facility];  Service: Urology;  Laterality: Right;  JJ STENT PLACEMENT   EYE SURGERY     PARATHYROIDECTOMY Left 05/19/2023   Procedure: LEFT INFERIOR PARATHYROIDECTOMY;  Surgeon: Eletha Boas, MD;  Location: [facility] ORS;  Service: General;  Laterality: Left;   ROTATOR CUFF REPAIR Right    Family History  Problem Relation Age of Onset   Diabetes Mother     Heart failure Mother    Stroke Mother    Kidney failure Father    Diabetes Father    Kidney disease Father    Cancer Paternal Uncle    Colon cancer Neg Hx    Esophageal cancer Neg Hx    Stomach cancer Neg Hx    Rectal cancer Neg Hx    Social History   Socioeconomic History   Marital status: Married    Spouse name: Not on file   Number of children: 4   Years of education: Not on file   Highest education level: Master's degree (e.g., MA, MS, MEng, MEd, MSW, MBA)  Occupational History   Occupation: retired Runner, broadcasting/film/video  Tobacco Use   Smoking status: Former    Current packs/day: 0.00    Average packs/day: 0.3 packs/day for 15.0 years (5.0 ttl pk-yrs)    Types: Cigarettes    Start date: 08/05/1970    Quit date: 08/04/1980  Years since quitting: 43.5    Passive exposure: Never   Smokeless tobacco: Former    Types: Chew    Quit date: 08/08/2004   Tobacco comments:    Former smoker 09/26/23  Vaping Use   Vaping status: Never Used  Substance and Sexual Activity   Alcohol use: Not Currently   Drug use: No   Sexual activity: Yes  Other Topics Concern   Not on file  Social History Narrative   Not on file   Social Drivers of Health   Financial Resource Strain: Low Risk  (02/15/2024)   Overall Financial Resource Strain (CARDIA)    Difficulty of Paying Living Expenses: Not hard at all  Food Insecurity: No Food Insecurity (02/15/2024)   Hunger Vital Sign    Worried About Running Out of Food in the Last Year: Never true    Ran Out of Food in the Last Year: Never true  Transportation Needs: No Transportation Needs (02/15/2024)   PRAPARE - Administrator, Civil Service (Medical): No    Lack of Transportation (Non-Medical): No  Physical Activity: Inactive (02/15/2024)   Exercise Vital Sign    Days of Exercise per Week: 0 days    Minutes of Exercise per Session: 0 min  Stress: No Stress Concern Present (02/15/2024)   [employer] of Occupational Health - Occupational  Stress Questionnaire    Feeling of Stress: Not at all  Social Connections: Socially Integrated (02/15/2024)   Social Connection and Isolation Panel    Frequency of Communication with Friends and Family: More than three times a week    Frequency of Social Gatherings with Friends and Family: Twice a week    Attends Religious Services: More than 4 times per year    Active Member of [employer] or Organizations: Yes    Attends Engineer, structural: More than 4 times per year    Marital Status: Married    Tobacco Counseling Counseling given: Not Answered Tobacco comments: Former smoker 09/26/23   Clinical Intake:  Pre-visit preparation completed: Yes  Pain : No/denies pain     Diabetes: No  How often do you need to have someone help you when you read instructions, pamphlets, or other written materials from your doctor or pharmacy?: 1 - Never  Interpreter Needed?: No  Information entered by :: Mliss Graff LPN   Activities of Daily Living    02/15/2024    8:45 AM 05/16/2023    8:54 AM  In your present state of health, do you have any difficulty performing the following activities:  Hearing? 0   Vision? 0   Difficulty concentrating or making decisions? 0   Walking or climbing stairs? 0   Dressing or bathing? 0   Doing errands, shopping? 0 0  Preparing Food and eating ? N   Using the Toilet? N   In the past six months, have you accidently leaked urine? N   Do you have problems with loss of bowel control? N   Managing your Medications? N   Managing your Finances? N   Housekeeping or managing your Housekeeping? N     Patient Care Team: Duanne Butler DASEN, MD as PCP - General (Family Medicine) Burnard Debby LABOR, MD (Inactive) as PCP - Cardiology (Cardiology) Inocencio Soyla Lunger, MD as PCP - Electrophysiology (Cardiology) Loreda Hacker, DPM as Consulting Physician (Podiatry)  Indicate any recent Medical Services you may have received from other than [facility] providers in  the past year (date may be approximate).  Assessment:   This is a routine wellness examination for Suhey[place].  Hearing/Vision screen Hearing Screening - Comments:: Bilateral hearing aids  Vision Screening - Comments:: [facility] Up to date   Goals Addressed             This Visit's Progress    Patient Stated       Stay healthy     Remain active and independent   On track      Depression Screen    02/15/2024    8:33 AM 12/26/2023    8:22 AM 12/01/2022   12:16 PM 12/23/2021    8:34 AM 12/22/2020    8:35 AM 12/20/2019    8:36 AM 12/18/2018   10:22 AM  PHQ 2/9 Scores  PHQ - 2 Score 0 0 0 0 0 0 0  PHQ- 9 Score 2     0     Fall Risk    02/15/2024    8:25 AM 12/26/2023    8:22 AM 12/01/2022   12:17 PM 12/22/2020    8:35 AM 12/20/2019    8:36 AM  Fall Risk   Falls in the past year? 0 0 0 0 0  Number falls in past yr: 0 0 0 0 0  Injury with Fall? 0 0 0 0 0  Risk for fall due to :   No Fall Risks No Fall Risks   Follow up Falls evaluation completed;Education provided;Falls prevention discussed  Falls prevention discussed;Education provided;Falls evaluation completed Falls evaluation completed       Data saved with a previous flowsheet row definition    MEDICARE RISK AT HOME: Medicare Risk at Home Any stairs in or around the home?: No If so, are there any without handrails?: No Home free of loose throw rugs in walkways, pet beds, electrical cords, etc?: Yes Adequate lighting in your home to reduce risk of falls?: Yes Life alert?: No Use of a cane, walker or w/c?: No Grab bars in the bathroom?: No Shower chair or bench in shower?: No Elevated toilet seat or a handicapped toilet?: No  TIMED UP AND GO:  Was the test performed?  No    Cognitive Function:        02/15/2024    8:29 AM 12/01/2022   12:18 PM  6CIT Screen  What Year? 0 points 0 points  What month? 0 points 0 points  What time? 0 points 0 points  Count back from 20 0 points 0 points  Months in reverse 0  points 0 points  Repeat phrase 0 points 0 points  Total Score 0 points 0 points    Immunizations Immunization History  Administered Date(s) Administered   Fluad Quad(high Dose 65+) 03/03/2022   Fluzone Influenza virus vaccine,trivalent (IIV3), split virus 04/10/2017, 02/21/2019, 04/06/2021   INFLUENZA, HIGH DOSE SEASONAL PF 02/08/2016, 04/10/2017, 02/15/2018, 02/21/2019, 02/20/2020   Influenza Split 03/06/2012   Influenza Whole 03/19/2009, 03/06/2010   Influenza,inj,Quad PF,6+ Mos 02/23/2013   Influenza-Unspecified 04/06/2014, 04/10/2017, 03/12/2021, 03/13/2023   Moderna Covid-19 Seasonal Vaccine 6 months thru 81years of age 44/21/2024   PFIZER Comirnaty(Gray Top)Covid-19 Tri-Sucrose Vaccine 09/29/2020   PFIZER(Purple Top)SARS-COV-2 Vaccination 06/26/2019, 07/17/2019, 03/11/2020, 04/06/2020   Pfizer Covid-19 Vaccine Bivalent Booster 37yrs & up 04/12/2021, 05/06/2021   Pneumococcal Conjugate-13 06/20/2014   Pneumococcal Polysaccharide-23 06/16/2008   Respiratory Syncytial Virus Vaccine,Recomb Aduvanted(Arexvy) 02/16/2022   Td 01/25/1999, 07/06/2009   Tdap 06/20/2014   Zoster Recombinant(Shingrix) 12/09/2017, 02/15/2018   Zoster, Live 02/09/2013    TDAP status: Up to date  Flu Vaccine status: Due, Education has been provided regarding the importance of this vaccine. Advised may receive this vaccine at local pharmacy or Health Dept. Aware to provide a copy of the vaccination record if obtained from local pharmacy or Health Dept. Verbalized acceptance and understanding.  Pneumococcal vaccine status: Up to date  Covid-19 vaccine status: Information provided on how to obtain vaccines.   Qualifies for Shingles Vaccine? No   Zostavax completed Yes   Shingrix Completed?: Yes  Screening Tests Health Maintenance  Topic Date Due   Diabetic kidney evaluation - Urine ACR  Never done   FOOT EXAM  12/26/2023   Influenza Vaccine  01/05/2024   COVID-19 Vaccine (7 - 2025-26 season)  02/05/2024   Colonoscopy  02/10/2024   OPHTHALMOLOGY EXAM  04/04/2024   HEMOGLOBIN A1C  05/24/2024   DTaP/Tdap/Td (4 - Td or Tdap) 06/20/2024   Diabetic kidney evaluation - eGFR measurement  12/25/2024   Medicare Annual Wellness (AWV)  02/14/2025   Pneumococcal Vaccine: 50+ Years  Completed   Zoster Vaccines- Shingrix  Completed   HPV VACCINES  Aged Out   Meningococcal B Vaccine  Aged Out   Hepatitis C Screening  Discontinued    Health Maintenance  Health Maintenance Due  Topic Date Due   Diabetic kidney evaluation - Urine ACR  Never done   FOOT EXAM  12/26/2023   Influenza Vaccine  01/05/2024   COVID-19 Vaccine (7 - 2025-26 season) 02/05/2024   Colonoscopy  02/10/2024    Colorectal cancer screening: Referral to GI placed 2022. Pt aware the office will call re: appt.  Lung Cancer Screening: (Low Dose CT Chest recommended if Age 40-80 years, 20 pack-year currently smoking OR have quit w/in 15years.) does not qualify.   Lung Cancer Screening Referral:   Additional Screening:  Hepatitis C Screening: does not qualify; Completed 2017  Vision Screening: Recommended annual ophthalmology exams for early detection of glaucoma and other disorders of the eye. Is the patient up to date with their annual eye exam?  Yes  Who is the provider or what is the name of the office in which the patient attends annual eye exams? [facility] If pt is not established with a provider, would they like to be referred to a provider to establish care? No .   Dental Screening: Recommended annual dental exams for proper oral hygiene    Community Resource Referral / Chronic Care Management: CRR required this visit?  No   CCM required this visit?  No     Plan:     I have personally reviewed and noted the following in the patient's chart:   Medical and social history Use of alcohol, tobacco or illicit drugs  Current medications and supplements including opioid prescriptions. Patient is not currently  taking opioid prescriptions. Functional ability and status Nutritional status Physical activity Advanced directives List of other physicians Hospitalizations, surgeries, and ER visits in previous 12 months Vitals Screenings to include cognitive, depression, and falls Referrals and appointments  In addition, I have reviewed and discussed with patient certain preventive protocols, quality metrics, and best practice recommendations. A written personalized care plan for preventive services as well as general preventive health recommendations were provided to patient.     Mliss Graff, LPN   [id number]   After Visit Summary: (MyChart) Due to this being a telephonic visit, the after visit summary with patients personalized plan was offered to patient via MyChart   Nurse Notes:

## 2024-02-15 NOTE — Telephone Encounter (Signed)
 Former Pt of Dr. Burnard. Seeing Dr. Francyne in October 2025. Is this RX something that Dr. Francyne wants to refill? Please advise.

## 2024-02-15 NOTE — Patient Instructions (Signed)
 [name] , Thank you for taking time to come for your Medicare Wellness Visit. I appreciate your ongoing commitment to your health goals. Please review the following plan we discussed and let me know if I can assist you in the future.   Screening recommendations/referrals: Colonoscopy: Education provided Recommended yearly ophthalmology/optometry visit for glaucoma screening and checkup Recommended yearly dental visit for hygiene and checkup  Vaccinations: Influenza vaccine: Education provided Pneumococcal vaccine: up to date Tdap vaccine: up to date  Shingles vaccine: up to date          Preventive Care 65 Years and Older, Male Preventive care refers to lifestyle choices and visits with your health care provider that can promote health and wellness. What does preventive care include? A yearly physical exam. This is also called an annual well check. Dental exams once or twice a year. Routine eye exams. Ask your health care provider how often you should have your eyes checked. Personal lifestyle choices, including: Daily care of your teeth and gums. Regular physical activity. Eating a healthy diet. Avoiding tobacco and drug use. Limiting alcohol use. Practicing safe sex. Taking low doses of aspirin every day. Taking vitamin and mineral supplements as recommended by your health care provider. What happens during an annual well check? The services and screenings done by your health care provider during your annual well check will depend on your age, overall health, lifestyle risk factors, and family history of disease. Counseling  Your health care provider may ask you questions about your: Alcohol use. Tobacco use. Drug use. Emotional well-being. Home and relationship well-being. Sexual activity. Eating habits. History of falls. Memory and ability to understand (cognition). Work and work Astronomer. Screening  You may have the following tests or  measurements: Height, weight, and BMI. Blood pressure. Lipid and cholesterol levels. These may be checked every 5 years, or more frequently if you are over 63 years old. Skin check. Lung cancer screening. You may have this screening every year starting at age 2 if you have a 30-pack-year history of smoking and currently smoke or have quit within the past 15 years. Fecal occult blood test (FOBT) of the stool. You may have this test every year starting at age 56. Flexible sigmoidoscopy or colonoscopy. You may have a sigmoidoscopy every 5 years or a colonoscopy every 10 years starting at age 32. Prostate cancer screening. Recommendations will vary depending on your family history and other risks. Hepatitis C blood test. Hepatitis B blood test. Sexually transmitted disease (STD) testing. Diabetes screening. This is done by checking your blood sugar (glucose) after you have not eaten for a while (fasting). You may have this done every 1-3 years. Abdominal aortic aneurysm (AAA) screening. You may need this if you are a current or former smoker. Osteoporosis. You may be screened starting at age 26 if you are at high risk. Talk with your health care provider about your test results, treatment options, and if necessary, the need for more tests. Vaccines  Your health care provider may recommend certain vaccines, such as: Influenza vaccine. This is recommended every year. Tetanus, diphtheria, and acellular pertussis (Tdap, Td) vaccine. You may need a Td booster every 10 years. Zoster vaccine. You may need this after age 77. Pneumococcal 13-valent conjugate (PCV13) vaccine. One dose is recommended after age 79. Pneumococcal polysaccharide (PPSV23) vaccine. One dose is recommended after age 39. Talk to your health care provider about which screenings and vaccines you need and how often you need them. This information  is not intended to replace advice given to you by your health care provider. Make sure  you discuss any questions you have with your health care provider. Document Released: 06/19/2015 Document Revised: 02/10/2016 Document Reviewed: 03/24/2015 Elsevier Interactive Patient Education  2017 [employer].  Fall Prevention in the Home Falls can cause injuries. They can happen to people of all ages. There are many things you can do to make your home safe and to help prevent falls. What can I do on the outside of my home? Regularly fix the edges of walkways and driveways and fix any cracks. Remove anything that might make you trip as you walk through a door, such as a raised step or threshold. Trim any bushes or trees on the path to your home. Use bright outdoor lighting. Clear any walking paths of anything that might make someone trip, such as rocks or tools. Regularly check to see if handrails are loose or broken. Make sure that both sides of any steps have handrails. Any raised decks and porches should have guardrails on the edges. Have any leaves, snow, or ice cleared regularly. Use sand or salt on walking paths during winter. Clean up any spills in your garage right away. This includes oil or grease spills. What can I do in the bathroom? Use night lights. Install grab bars by the toilet and in the tub and shower. Do not use towel bars as grab bars. Use non-skid mats or decals in the tub or shower. If you need to sit down in the shower, use a plastic, non-slip stool. Keep the floor dry. Clean up any water that spills on the floor as soon as it happens. Remove soap buildup in the tub or shower regularly. Attach bath mats securely with double-sided non-slip rug tape. Do not have throw rugs and other things on the floor that can make you trip. What can I do in the bedroom? Use night lights. Make sure that you have a light by your bed that is easy to reach. Do not use any sheets or blankets that are too big for your bed. They should not hang down onto the floor. Have a firm  chair that has side arms. You can use this for support while you get dressed. Do not have throw rugs and other things on the floor that can make you trip. What can I do in the kitchen? Clean up any spills right away. Avoid walking on wet floors. Keep items that you use a lot in easy-to-reach places. If you need to reach something above you, use a strong step stool that has a grab bar. Keep electrical cords out of the way. Do not use floor polish or wax that makes floors slippery. If you must use wax, use non-skid floor wax. Do not have throw rugs and other things on the floor that can make you trip. What can I do with my stairs? Do not leave any items on the stairs. Make sure that there are handrails on both sides of the stairs and use them. Fix handrails that are broken or loose. Make sure that handrails are as long as the stairways. Check any carpeting to make sure that it is firmly attached to the stairs. Fix any carpet that is loose or worn. Avoid having throw rugs at the top or bottom of the stairs. If you do have throw rugs, attach them to the floor with carpet tape. Make sure that you have a light switch at the top of  the stairs and the bottom of the stairs. If you do not have them, ask someone to add them for you. What else can I do to help prevent falls? Wear shoes that: Do not have high heels. Have rubber bottoms. Are comfortable and fit you well. Are closed at the toe. Do not wear sandals. If you use a stepladder: Make sure that it is fully opened. Do not climb a closed stepladder. Make sure that both sides of the stepladder are locked into place. Ask someone to hold it for you, if possible. Clearly mark and make sure that you can see: Any grab bars or handrails. First and last steps. Where the edge of each step is. Use tools that help you move around (mobility aids) if they are needed. These include: Canes. Walkers. Scooters. Crutches. Turn on the lights when you go  into a dark area. Replace any light bulbs as soon as they burn out. Set up your furniture so you have a clear path. Avoid moving your furniture around. If any of your floors are uneven, fix them. If there are any pets around you, be aware of where they are. Review your medicines with your doctor. Some medicines can make you feel dizzy. This can increase your chance of falling. Ask your doctor what other things that you can do to help prevent falls. This information is not intended to replace advice given to you by your health care provider. Make sure you discuss any questions you have with your health care provider. Document Released: 03/19/2009 Document Revised: 10/29/2015 Document Reviewed: 06/27/2014 Elsevier Interactive Patient Education  2017 [employer].

## 2024-02-16 DIAGNOSIS — N184 Chronic kidney disease, stage 4 (severe): Secondary | ICD-10-CM | POA: Diagnosis not present

## 2024-02-16 DIAGNOSIS — E119 Type 2 diabetes mellitus without complications: Secondary | ICD-10-CM | POA: Diagnosis not present

## 2024-02-16 DIAGNOSIS — Z8639 Personal history of other endocrine, nutritional and metabolic disease: Secondary | ICD-10-CM | POA: Diagnosis not present

## 2024-02-16 DIAGNOSIS — Q613 Polycystic kidney, unspecified: Secondary | ICD-10-CM | POA: Diagnosis not present

## 2024-02-16 DIAGNOSIS — Z87442 Personal history of urinary calculi: Secondary | ICD-10-CM | POA: Diagnosis not present

## 2024-03-18 ENCOUNTER — Other Ambulatory Visit: Payer: Self-pay

## 2024-03-19 ENCOUNTER — Telehealth: Payer: Self-pay | Admitting: Cardiology

## 2024-03-19 MED ORDER — HYDRALAZINE HCL 25 MG PO TABS: 37.5000 mg | ORAL_TABLET | Freq: Two times a day (BID) | ORAL | 0 refills | Status: DC

## 2024-03-19 NOTE — Telephone Encounter (Signed)
*  STAT* If patient is at the pharmacy, call can be transferred to refill team.   1. Which medications need to be refilled? (please list name of each medication and dose if known)   hydrALAZINE  (APRESOLINE ) 25 MG tablet   2. Would you like to learn more about the convenience, safety, & potential cost savings by using the [facility]?   3. Are you open to using the [facility] (Type [facility]. ).  4. Which pharmacy/location (including street and city if local pharmacy) is medication to be sent to?  [facility] - [place], [place] - [address]   5. Do they need a 30 day or 90 day supply?   90 day  Caller [facility]) stated patient is completely out of his medication.  Patient has appointment scheduled on 10/24 with Dr. Francyne.

## 2024-03-19 NOTE — Telephone Encounter (Signed)
 RX sent in

## 2024-03-22 ENCOUNTER — Other Ambulatory Visit: Payer: Self-pay | Admitting: Adult Health

## 2024-03-22 MED ORDER — CARVEDILOL 12.5 MG PO TABS: ORAL_TABLET | ORAL | 3 refills | Status: AC

## 2024-03-29 ENCOUNTER — Ambulatory Visit: Attending: Cardiovascular Disease | Admitting: Cardiovascular Disease

## 2024-03-29 ENCOUNTER — Encounter: Payer: Self-pay | Admitting: Cardiovascular Disease

## 2024-03-29 VITALS — BP 134/60 | HR 62 | Ht 71.0 in | Wt 215.4 lb

## 2024-03-29 DIAGNOSIS — N184 Chronic kidney disease, stage 4 (severe): Secondary | ICD-10-CM

## 2024-03-29 DIAGNOSIS — I480 Paroxysmal atrial fibrillation: Secondary | ICD-10-CM | POA: Diagnosis not present

## 2024-03-29 DIAGNOSIS — E785 Hyperlipidemia, unspecified: Secondary | ICD-10-CM | POA: Diagnosis not present

## 2024-03-29 DIAGNOSIS — E1122 Type 2 diabetes mellitus with diabetic chronic kidney disease: Secondary | ICD-10-CM | POA: Diagnosis not present

## 2024-03-29 DIAGNOSIS — D6869 Other thrombophilia: Secondary | ICD-10-CM

## 2024-03-29 DIAGNOSIS — G4733 Obstructive sleep apnea (adult) (pediatric): Secondary | ICD-10-CM

## 2024-03-29 DIAGNOSIS — I10 Essential (primary) hypertension: Secondary | ICD-10-CM | POA: Diagnosis not present

## 2024-03-29 MED ORDER — AMLODIPINE BESYLATE 5 MG PO TABS: 5.0000 mg | ORAL_TABLET | Freq: Every day | ORAL | 3 refills | Status: AC

## 2024-03-29 NOTE — Patient Instructions (Signed)
 Medication Instructions:  Stop Hydralazine  Start Amlodipine  5 mg daily-  Please send in a daily BP log next week.  *If you need a refill on your cardiac medications before your next appointment, please call your pharmacy*  Lab Work: None ordered If you have labs (blood work) drawn today and your tests are completely normal, you will receive your results only by: MyChart Message (if you have MyChart) OR A paper copy in the mail If you have any lab test that is abnormal or we need to change your treatment, we will call you to review the results.  Testing/Procedures: None ordered  Follow-Up: At [facility], you and your health needs are our priority.  As part of our continuing mission to provide you with exceptional heart care, our providers are all part of one team.  This team includes your primary Cardiologist (physician) and Advanced Practice Providers or APPs (Physician Assistants and Nurse Practitioners) who all work together to provide you with the care you need, when you need it.  Your next appointment:   6 month(s)  Provider:   Dr Francyne  We recommend signing up for the patient portal called MyChart.  Sign up information is provided on this After Visit Summary.  MyChart is used to connect with patients for Virtual Visits (Telemedicine).  Patients are able to view lab/test results, encounter notes, upcoming appointments, etc.  Non-urgent messages can be sent to your provider as well.   To learn more about what you can do with MyChart, go to [email or web address].

## 2024-03-31 NOTE — Progress Notes (Signed)
 Cardiology Office Note   Date:  03/31/2024  ID:  Martavis, Gurney 1940-11-08, MRN [id number] PCP: Duanne Butler DASEN, MD   HeartCare Providers Cardiologist:  Debby Sor, MD (Inactive) Electrophysiologist:  Will Gladis Norton, MD     History of Present Illness Jeet Shough Seki is a 83 y.o. male with a history of OSA , echo evidence of grade 2 LV diastolic dysfunction, DM, hypercholesterolemia, HTN, atrial flutter and atrial fibrillation, CKD 4 here to transition general cardiology care after Dr. Joesphine retirement.  He is generally doing well from a cardiac point of view.  He has not been troubled by chest pain, worsening shortness of breath or palpitations.  He does have bilateral lower extremity edema, always worse on the right side.    He has been given the wrong type of BiPAP equipment.  His wife has noticed that he once again has apneic episodes and will gently poke him to make him start breathing again.SABRA  He needs a device with adaptive servo ventilation.    He does not think he has had any breakthrough episodes of atrial fibrillation since his ablation in March 2025.  He has not had any falls or bleeding problems on Eliquis  (dose adjusted for age and renal dysfunction).  His most recent echocardiogram performed in 2022 showed normal left ventricular systolic function with EF 60 to 65%, mild LVH with grade 2 diastolic dilation, moderate left atrial dilation and no significant valvular problems.  Cardiac MRI performed in 2019 showed normal LV function and no evidence of scar on delayed gadolinium imaging.  Metabolic control is very good with a hemoglobin A1c of only 5.8%, LDL 59, HDL 42 and normal triglycerides.  His most recent creatinine was 2.45 slightly improved from earlier this summer when it was around 2.7.  His GFR seems to be in the 25-30 range.  His nephrologist is Dr. Marlee.  At his last appointment with Dr. Marlee , his blood pressure was apparently a little  high and Dr. Marlee recommended starting amlodipine  5 mg daily.  However, before starting that medication, Greysin noticed that his blood pressure was typically in the 120/60 range and has not yet started taking amlodipine .  Today in the office his blood pressure is 134/60.  He is taking hydralazine  twice a day in addition to carvedilol .  He takes alternating doses of 40 mg and 20 mg of furosemide  daily.  He is also on Jardiance.  He has a history of swelling with ACE inhibitors.  Studies Reviewed     Echocardiogram 2022 Cardiac MRI 2019   Risk Assessment/Calculations  CHA2DS2-VASc Score = 4   This indicates a 4.8% annual risk of stroke. The patient's score is based upon: CHF History: 0 HTN History: 1 Diabetes History: 1 Stroke History: 0 Vascular Disease History: 0 Age Score: 2 Gender Score: 0         Physical Exam VS:  BP 134/60 (BP Location: Left Arm, Patient Position: Sitting, Cuff Size: Normal)   Pulse 62   Ht 5' 11 (1.803 m)   Wt 215 lb 6.4 oz (97.7 kg)   SpO2 97%   BMI 30.04 kg/m        Wt Readings from Last 3 Encounters:  03/29/24 215 lb 6.4 oz (97.7 kg)  02/15/24 216 lb (98 kg)  12/26/23 216 lb 2 oz (98 kg)    GEN: Well nourished, well developed in no acute distress NECK: 7-8 cm JVD; No carotid bruits CARDIAC: RRR, 1/6 aortic ejection  murmur is early peaking, no diastolic murmurs, rubs, gallops RESPIRATORY:  Clear to auscultation without rales, wheezing or rhonchi  ABDOMEN: Soft, non-tender, non-distended EXTREMITIES: 2+ left ankle edema and 3+ right ankle edema  ASSESSMENT AND PLAN AFib: Clinically he has had an excellent response to ablation.  He remains on anticoagulation roughly 7 months after the ablation procedure. Anticoagulation: Eliquis  dose adjusted for renal dysfunction and age.  Has not had bleeding problems or falls. DM: Excellent control on Jardiance and Ozempic. HLP: All lipid parameters are in target range on current medication. CKD4:  Renal function has been stable over the last couple of years with a GFR in the 25-30 range.  Sees Dr. Marlee. HTN: His blood pressure has improved as he has lost some weight.  I agree that he should be on a vasodilator, but hopefully when we start the amlodipine  we can discontinue the hydralazine .  Unfortunately, both these medications may be contributing to his lower extremity edema, but at least with amlodipine  we should expect a smoother more stable blood pressure level. OSA: He needs a BiPAP machine that has ASV, as was prescribed in the past.  Will try to see if we can expedite this before his first appointment with Dr. Shlomo in the sleep clinic.       Dispo: Stop hydralazine  and start amlodipine  5 mg daily.  Please send a blood pressure log in 1-2 weeks.  Refer to Dr. Dorine sleep clinic.    Signed, Jerel Balding, MD

## 2024-04-04 ENCOUNTER — Telehealth: Payer: Self-pay | Admitting: *Deleted

## 2024-04-04 DIAGNOSIS — I10 Essential (primary) hypertension: Secondary | ICD-10-CM

## 2024-04-04 DIAGNOSIS — G4733 Obstructive sleep apnea (adult) (pediatric): Secondary | ICD-10-CM

## 2024-04-04 DIAGNOSIS — G4739 Other sleep apnea: Secondary | ICD-10-CM

## 2024-04-04 DIAGNOSIS — I480 Paroxysmal atrial fibrillation: Secondary | ICD-10-CM

## 2024-04-04 DIAGNOSIS — I272 Pulmonary hypertension, unspecified: Secondary | ICD-10-CM

## 2024-04-04 NOTE — Telephone Encounter (Signed)
 Bipap PA titration started through availty currently pending.

## 2024-04-04 NOTE — Telephone Encounter (Signed)
-----   Message from Wilbert Bihari sent at 03/31/2024  9:44 PM EDT ----- So I looked at Tom's last notes and his last PAP titration.  I think we will have to get another titration with ASV as I do not think insurance will approve changing to ASV without a titration (despite having been on ASV remotely).    Brad can you please get this patient scheduled ASAP with the lab for a BiPAP ASV titration due to ongoing respiratory events with current auto BiPAP settings please  Traci ----- Message ----- From: Francyne Headland, MD Sent: 03/31/2024   4:37 PM EDT To: Wilbert JONELLE Bihari, MD  Traci, This gentleman saw Charlena Sor for many years for sleep apnea and I am now his cardiologist.  He will be coming to see you in the sleep clinic, but apparently he was delivered a new BiPAP device that does not have ASV, which he has needed in the past.  His wife has noticed that he has apneic events at night again.  Can I expedite the order for an appropriate BiPAP machine somehow, or does he have to wait until he sees you in clinic?  If I can order it, please tell me how to do it. Mihai

## 2024-04-05 NOTE — Telephone Encounter (Signed)
 Bipap titration APPROVED-[employer] REFERENCE NUMBER [id number] DATES-05-13-2024---09-01-2024.

## 2024-04-09 ENCOUNTER — Other Ambulatory Visit: Payer: Self-pay

## 2024-04-09 DIAGNOSIS — I10 Essential (primary) hypertension: Secondary | ICD-10-CM

## 2024-04-10 ENCOUNTER — Encounter: Payer: Self-pay | Admitting: Cardiovascular Disease

## 2024-04-11 MED ORDER — FUROSEMIDE 40 MG PO TABS: ORAL_TABLET | ORAL | 3 refills | Status: AC

## 2024-04-16 DIAGNOSIS — G4733 Obstructive sleep apnea (adult) (pediatric): Secondary | ICD-10-CM | POA: Diagnosis not present

## 2024-04-16 DIAGNOSIS — G4731 Primary central sleep apnea: Secondary | ICD-10-CM | POA: Diagnosis not present

## 2024-05-06 ENCOUNTER — Ambulatory Visit: Admitting: Podiatry

## 2024-05-06 ENCOUNTER — Encounter: Payer: Self-pay | Admitting: Podiatry

## 2024-05-06 DIAGNOSIS — M79674 Pain in right toe(s): Secondary | ICD-10-CM | POA: Diagnosis not present

## 2024-05-06 DIAGNOSIS — E119 Type 2 diabetes mellitus without complications: Secondary | ICD-10-CM

## 2024-05-06 DIAGNOSIS — B351 Tinea unguium: Secondary | ICD-10-CM | POA: Diagnosis not present

## 2024-05-06 DIAGNOSIS — M79675 Pain in left toe(s): Secondary | ICD-10-CM | POA: Diagnosis not present

## 2024-05-06 NOTE — Progress Notes (Signed)
 This patient returns to my office for at risk foot care.  This patient requires this care by a professional since this patient will be at risk due to having diabetes and renal insufficiency and thrombocytopenia..  Patient is taking eliquis.  This patient is unable to cut nails himself since the patient cannot reach his nails.These nails are painful walking and wearing shoes.  This patient presents for at risk foot care today.  General Appearance  Alert, conversant and in no acute stress.  Vascular  Dorsalis pedis and posterior tibial  pulses are palpable  bilaterally.  Capillary return is within normal limits  bilaterally. Temperature is within normal limits  bilaterally.  Neurologic  Senn-Weinstein monofilament wire test within normal limits  bilaterally. Muscle power within normal limits bilaterally.  Nails Thick disfigured discolored nails with subungual debris  from hallux to fifth toes bilaterally. No evidence of bacterial infection or drainage bilaterally.  Orthopedic  No limitations of motion  feet .  No crepitus or effusions noted.  No bony pathology or digital deformities noted.  HAV  B/L.  Skin  normotropic skin with no porokeratosis noted bilaterally.  No signs of infections or ulcers noted.     Onychomycosis  Pain in right toes  Pain in left toes  Consent was obtained for treatment procedures.   Mechanical debridement of nails 1-5  bilaterally performed with a nail nipper.  Filed with dremel without incident.    Return office visit   3 months                   Told patient to return for periodic foot care and evaluation due to potential at risk complications.   Helane Gunther DPM

## 2024-05-07 ENCOUNTER — Other Ambulatory Visit: Payer: Self-pay | Admitting: Cardiovascular Disease

## 2024-05-07 DIAGNOSIS — I480 Paroxysmal atrial fibrillation: Secondary | ICD-10-CM

## 2024-05-07 MED ORDER — APIXABAN 2.5 MG PO TABS: 2.5000 mg | ORAL_TABLET | Freq: Two times a day (BID) | ORAL | 1 refills | Status: AC

## 2024-05-07 NOTE — Telephone Encounter (Signed)
 Prescription refill request for Eliquis  received. Indication:afib Last office visit:10/25 Scr: 2.70  7/25 Age:83 Weight:97.7  kg  Prescription refilled

## 2024-05-16 DIAGNOSIS — E119 Type 2 diabetes mellitus without complications: Secondary | ICD-10-CM | POA: Diagnosis not present

## 2024-05-16 DIAGNOSIS — I10 Essential (primary) hypertension: Secondary | ICD-10-CM | POA: Diagnosis not present

## 2024-05-27 NOTE — Progress Notes (Signed)
 Pharmacy Quality Measure Review  This patient is appearing on a report for being at risk of failing the adherence measure for cholesterol (statin) medications this calendar year.   Medication: Atorvastatin  10 mg Last fill date: 04/29/24 for 90 day supply  Insurance report was not up to date. No action needed at this time.   Jenkins Graces, PharmD PGY1 Pharmacy Resident

## 2024-05-28 ENCOUNTER — Encounter: Payer: Self-pay | Admitting: Nurse Practitioner

## 2024-05-31 LAB — OPHTHALMOLOGY REPORT-SCANNED

## 2024-06-22 ENCOUNTER — Telehealth: Admitting: Nurse Practitioner

## 2024-06-22 DIAGNOSIS — H1031 Unspecified acute conjunctivitis, right eye: Secondary | ICD-10-CM

## 2024-06-22 MED ORDER — POLYMYXIN B-TRIMETHOPRIM 10000-0.1 UNIT/ML-% OP SOLN: 1.0000 [drp] | OPHTHALMIC | 0 refills | Status: AC

## 2024-06-22 NOTE — Patient Instructions (Addendum)
 " Christopher ORN Goeden, thank you for joining Haze ORN Servant, NP for today's virtual visit.  While this provider is not your primary care provider (PCP), if your PCP is located in our provider database this encounter information will be shared with them immediately following your visit.   A [facility] MyChart account gives you access to today's visit and all your visits, tests, and labs performed at [facility]  click here if you don't have a [facility] MyChart account or go to [email or web address]  Consent: (Patient) Martin Cordova provided verbal consent for this virtual visit at the beginning of the encounter.  Current Medications:  Current Outpatient Medications:    trimethoprim -polymyxin b  (POLYTRIM ) ophthalmic solution, Place 1 drop into the right eye every 4 (four) hours., Disp: 10 mL, Rfl: 0   amLODipine  (NORVASC ) 5 MG tablet, Take 1 tablet (5 mg total) by mouth daily., Disp: 90 tablet, Rfl: 3   apixaban  (ELIQUIS ) 2.5 MG TABS tablet, Take 1 tablet (2.5 mg total) by mouth 2 (two) times daily., Disp: 180 tablet, Rfl: 1   atorvastatin  (LIPITOR) 10 MG tablet, TAKE ONE TABLET BY MOUTH ONCE DAILY AT BEDTIME., Disp: 90 tablet, Rfl: 3   carvedilol  (COREG ) 12.5 MG tablet, Take 1 tablet ( 12.5 mg ) in morning and 1/2 tablet ( 6.25 mg ) in pm, Disp: 135 tablet, Rfl: 3   empagliflozin (JARDIANCE) 10 MG TABS tablet, Take 10 mg by mouth daily., Disp: , Rfl:    fluticasone  (FLONASE ) 50 MCG/ACT nasal spray, Place 2 sprays into both nostrils daily., Disp: 16 g, Rfl: 6   furosemide  (LASIX ) 40 MG tablet, Alternate between taking 40mg  (1 tablet) a day and 20mg  (1/2 tablet) a day., Disp: 90 tablet, Rfl: 3   omega-3 acid ethyl esters (LOVAZA ) 1 g capsule, Take 2 capsules (2 g total) by mouth daily., Disp: 180 capsule, Rfl: 1   OZEMPIC, 0.25 OR 0.5 MG/DOSE, 2 MG/3ML SOPN, Inject 0.25 mg into the skin once a week., Disp: , Rfl:    tadalafil (CIALIS) 5 MG tablet, Take 5 mg by mouth every  evening., Disp: , Rfl:    Medications ordered in this encounter:  Meds ordered this encounter  Medications   trimethoprim -polymyxin b  (POLYTRIM ) ophthalmic solution    Sig: Place 1 drop into the right eye every 4 (four) hours.    Dispense:  10 mL    Refill:  0    Supervising Provider:   BLAISE ALEENE KIDD [[id number]]     *If you need refills on other medications prior to your next appointment, please contact your pharmacy*  Follow-Up: Call back or seek an in-person evaluation if the symptoms worsen or if the condition fails to improve as anticipated.  [facility] [phone]  Other Instructions Apply cold compresses to right eye for relief of discomfort   If you have been instructed to have an in-person evaluation today at a local Urgent Care facility, please use the link below. It will take you to a list of all of our available [facility], including address, phone number and hours of operation. Please do not delay care.  [facility]  If you or a family member do not have a primary care provider, use the link below to schedule a visit and establish care. When you choose a [facility] primary care physician or advanced practice provider, you gain a long-term partner in health. Find a Primary Care Provider  Learn more about [facility]'s in-office and virtual care options: [facility] - Get Care Now  "

## 2024-06-22 NOTE — Progress Notes (Signed)
 " Virtual Visit Consent   Martin Cordova, you are scheduled for a virtual visit with a [facility] provider today. Just as with appointments in the office, your consent must be obtained to participate. Your consent will be active for this visit and any virtual visit you may have with one of our providers in the next 365 days. If you have a MyChart account, a copy of this consent can be sent to you electronically.  As this is a virtual visit, video technology does not allow for your provider to perform a traditional examination. This may limit your provider's ability to fully assess your condition. If your provider identifies any concerns that need to be evaluated in person or the need to arrange testing (such as labs, EKG, etc.), we will make arrangements to do so. Although advances in technology are sophisticated, we cannot ensure that it will always work on either your end or our end. If the connection with a video visit is poor, the visit may have to be switched to a telephone visit. With either a video or telephone visit, we are not always able to ensure that we have a secure connection.  By engaging in this virtual visit, you consent to the provision of healthcare and authorize for your insurance to be billed (if applicable) for the services provided during this visit. Depending on your insurance coverage, you may receive a charge related to this service.  I need to obtain your verbal consent now. Are you willing to proceed with your visit today? Waldon Sheerin Schoenfelder has provided verbal consent on 06/22/2024 for a virtual visit (video or telephone). Haze ORN Servant, NP  Date: 06/22/2024 12:25 PM   Virtual Visit via Video Note   I, Haze ORN Servant, connected with  Ramon Zanders Hagstrom  ([id number], [birth year]) on 06/22/24 at 12:00 PM EST by a video-enabled telemedicine application and verified that I am speaking with the correct person using two identifiers.  Location: Patient: Virtual Visit Location  Patient: Home Provider: Virtual Visit Location Provider: Home Office   I discussed the limitations of evaluation and management by telemedicine and the availability of in person appointments. The patient expressed understanding and agreed to proceed.    History of Present Illness: Martin Cordova is a 84 y.o. who identifies as a male who was assigned male at birth, and is being seen today for right bacterial conjunctivitis.  Mr. Gibbon has been experiencing right eye tearing, irritation, itching, matting and crusting over the past few days. He is also experiencing sinus symptoms of nasal congestion   Problems:  Patient Active Problem List   Diagnosis Date Noted   Tick bite of groin 10/13/2023   Hypercoagulable state due to persistent atrial fibrillation (HCC) 09/26/2023   Status post parathyroidectomy 06/21/2023   Hyperparathyroidism, primary 05/08/2023   Screening for prostate cancer 01/11/2023   Chronic kidney disease, stage 3b (HCC) 07/14/2021   Polycystic kidney disease 07/14/2021   Pain due to onychomycosis of toenails of both feet 10/02/2020   Palpitations    Nonalcoholic fatty liver disease    Nephrolithiasis    Diverticulosis    Diverticulitis    Diabetes mellitus without complication (HCC)    Colon polyps    Thrombocytopenia 11/16/2015   Anticoagulation adequate 10/23/2015   Trigeminy 05/14/2014   Hyperlipidemia LDL goal <70 04/07/2014   Essential hypertension 04/07/2014   Type 2 diabetes mellitus (HCC) 12/02/2013   History of colonic polyps 11/08/2013   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/08/2013  Atrial flutter (HCC) 07/29/2013   PAF (paroxysmal atrial fibrillation) (HCC) 04/09/2013   Edema 11/05/2012   Pulmonary HTN (HCC)    Renal insufficiency 06/01/2011   Obesity 11/10/2009   OBESITY 11/10/2009   Sleep apnea 10/22/2007   OSA treated with BiPAP 10/22/2007   Normal coronary arteries 04/06/2007   VENTRICULAR TACHYCARDIA 03/27/2007   HYPERLIPIDEMIA  12/07/2006   THROMBOCYTOPENIA 12/07/2006   Essential hypertension 12/07/2006   GERD 12/07/2006   TRANSAMINASES, SERUM, ELEVATED 12/07/2006   Other specified abnormal findings of blood chemistry 12/07/2006    Allergies: Allergies[1] Medications: Current Medications[2]  Observations/Objective: Patient is well-developed, well-nourished in no acute distress.  Resting comfortably at home.  Head is normocephalic, atraumatic.  No labored breathing.  Speech is clear and coherent with logical content.  Patient is alert and oriented at baseline.    Assessment and Plan: 1. Acute bacterial conjunctivitis of right eye (Primary) - trimethoprim -polymyxin b  (POLYTRIM ) ophthalmic solution; Place 1 drop into the right eye every 4 (four) hours.  Dispense: 10 mL; Refill: 0  Apply cold compresses to right eye for relief of discomfort  Follow Up Instructions: I discussed the assessment and treatment plan with the patient. The patient was provided an opportunity to ask questions and all were answered. The patient agreed with the plan and demonstrated an understanding of the instructions.  A copy of instructions were sent to the patient via MyChart unless otherwise noted below.    The patient was advised to call back or seek an in-person evaluation if the symptoms worsen or if the condition fails to improve as anticipated.    Haze LELON Servant, NP      [1]  Allergies Allergen Reactions   Lisinopril  Swelling and Other (See Comments)    Renal insufficiency also   Trulicity [Dulaglutide] Other (See Comments)    Bloating    Ibuprofen Other (See Comments)    Per the patient, he has kidney and liver issues and is NOT suppose to take this  [2]  Current Outpatient Medications:    trimethoprim -polymyxin b  (POLYTRIM ) ophthalmic solution, Place 1 drop into the right eye every 4 (four) hours., Disp: 10 mL, Rfl: 0   amLODipine  (NORVASC ) 5 MG tablet, Take 1 tablet (5 mg total) by mouth daily., Disp: 90  tablet, Rfl: 3   apixaban  (ELIQUIS ) 2.5 MG TABS tablet, Take 1 tablet (2.5 mg total) by mouth 2 (two) times daily., Disp: 180 tablet, Rfl: 1   atorvastatin  (LIPITOR) 10 MG tablet, TAKE ONE TABLET BY MOUTH ONCE DAILY AT BEDTIME., Disp: 90 tablet, Rfl: 3   carvedilol  (COREG ) 12.5 MG tablet, Take 1 tablet ( 12.5 mg ) in morning and 1/2 tablet ( 6.25 mg ) in pm, Disp: 135 tablet, Rfl: 3   empagliflozin (JARDIANCE) 10 MG TABS tablet, Take 10 mg by mouth daily., Disp: , Rfl:    fluticasone  (FLONASE ) 50 MCG/ACT nasal spray, Place 2 sprays into both nostrils daily., Disp: 16 g, Rfl: 6   furosemide  (LASIX ) 40 MG tablet, Alternate between taking 40mg  (1 tablet) a day and 20mg  (1/2 tablet) a day., Disp: 90 tablet, Rfl: 3   omega-3 acid ethyl esters (LOVAZA ) 1 g capsule, Take 2 capsules (2 g total) by mouth daily., Disp: 180 capsule, Rfl: 1   OZEMPIC, 0.25 OR 0.5 MG/DOSE, 2 MG/3ML SOPN, Inject 0.25 mg into the skin once a week., Disp: , Rfl:    tadalafil (CIALIS) 5 MG tablet, Take 5 mg by mouth every evening., Disp: , Rfl:   "

## 2024-07-08 ENCOUNTER — Ambulatory Visit (HOSPITAL_BASED_OUTPATIENT_CLINIC_OR_DEPARTMENT_OTHER): Admitting: Cardiology

## 2024-07-11 ENCOUNTER — Ambulatory Visit (HOSPITAL_BASED_OUTPATIENT_CLINIC_OR_DEPARTMENT_OTHER): Admitting: Cardiology

## 2024-07-11 DIAGNOSIS — I10 Essential (primary) hypertension: Secondary | ICD-10-CM

## 2024-07-11 DIAGNOSIS — I272 Pulmonary hypertension, unspecified: Secondary | ICD-10-CM

## 2024-07-11 DIAGNOSIS — G4733 Obstructive sleep apnea (adult) (pediatric): Secondary | ICD-10-CM

## 2024-07-11 DIAGNOSIS — G4739 Other sleep apnea: Secondary | ICD-10-CM

## 2024-07-11 DIAGNOSIS — I480 Paroxysmal atrial fibrillation: Secondary | ICD-10-CM

## 2024-08-02 ENCOUNTER — Ambulatory Visit: Admitting: Nurse Practitioner

## 2024-08-05 ENCOUNTER — Ambulatory Visit: Admitting: Podiatry

## 2024-11-22 ENCOUNTER — Ambulatory Visit: Admitting: Cardiovascular Disease

## 2024-12-24 ENCOUNTER — Other Ambulatory Visit

## 2024-12-30 ENCOUNTER — Encounter: Admitting: Family Medicine

## 2025-02-20 ENCOUNTER — Encounter
